# Patient Record
Sex: Male | Born: 1955 | Race: Black or African American | Hispanic: No | Marital: Married | Smoking: Former smoker
Health system: Southern US, Community
[De-identification: ages and names within clinical notes are randomized; demographics above are authoritative.]

## PROBLEM LIST (undated history)

## (undated) DIAGNOSIS — I10 Essential (primary) hypertension: Secondary | ICD-10-CM

## (undated) DIAGNOSIS — N186 End stage renal disease: Secondary | ICD-10-CM

## (undated) DIAGNOSIS — K219 Gastro-esophageal reflux disease without esophagitis: Secondary | ICD-10-CM

## (undated) DIAGNOSIS — H269 Unspecified cataract: Secondary | ICD-10-CM

## (undated) DIAGNOSIS — J984 Other disorders of lung: Secondary | ICD-10-CM

## (undated) DIAGNOSIS — G709 Myoneural disorder, unspecified: Secondary | ICD-10-CM

## (undated) DIAGNOSIS — E11319 Type 2 diabetes mellitus with unspecified diabetic retinopathy without macular edema: Secondary | ICD-10-CM

## (undated) DIAGNOSIS — H35039 Hypertensive retinopathy, unspecified eye: Secondary | ICD-10-CM

## (undated) DIAGNOSIS — E872 Acidosis, unspecified: Secondary | ICD-10-CM

## (undated) DIAGNOSIS — M545 Low back pain, unspecified: Secondary | ICD-10-CM

## (undated) DIAGNOSIS — K859 Acute pancreatitis without necrosis or infection, unspecified: Secondary | ICD-10-CM

## (undated) DIAGNOSIS — E119 Type 2 diabetes mellitus without complications: Secondary | ICD-10-CM

## (undated) DIAGNOSIS — F209 Schizophrenia, unspecified: Secondary | ICD-10-CM

## (undated) HISTORY — PX: DENTAL SURGERY: SHX609

## (undated) HISTORY — DX: Type 2 diabetes mellitus with unspecified diabetic retinopathy without macular edema: E11.319

## (undated) HISTORY — DX: Unspecified cataract: H26.9

## (undated) HISTORY — PX: COLONOSCOPY: SHX174

## (undated) HISTORY — DX: Hypertensive retinopathy, unspecified eye: H35.039

## (undated) HISTORY — PX: CATARACT EXTRACTION: SUR2

## (undated) HISTORY — DX: Other disorders of lung: J98.4

---

## 1994-01-23 ENCOUNTER — Encounter (INDEPENDENT_AMBULATORY_CARE_PROVIDER_SITE_OTHER): Payer: Self-pay | Admitting: *Deleted

## 1997-06-28 ENCOUNTER — Emergency Department (HOSPITAL_COMMUNITY): Admission: EM | Admit: 1997-06-28 | Discharge: 1997-06-28 | Payer: Self-pay | Admitting: Emergency Medicine

## 1998-05-08 ENCOUNTER — Emergency Department (HOSPITAL_COMMUNITY): Admission: EM | Admit: 1998-05-08 | Discharge: 1998-05-09 | Payer: Self-pay | Admitting: Emergency Medicine

## 1998-10-10 ENCOUNTER — Emergency Department (HOSPITAL_COMMUNITY): Admission: EM | Admit: 1998-10-10 | Discharge: 1998-10-10 | Payer: Self-pay | Admitting: Emergency Medicine

## 1998-10-10 ENCOUNTER — Encounter: Payer: Self-pay | Admitting: Emergency Medicine

## 1998-11-10 ENCOUNTER — Encounter: Admission: RE | Admit: 1998-11-10 | Discharge: 1998-11-10 | Payer: Self-pay | Admitting: Family Medicine

## 1998-11-10 ENCOUNTER — Observation Stay (HOSPITAL_COMMUNITY): Admission: AD | Admit: 1998-11-10 | Discharge: 1998-11-11 | Payer: Self-pay | Admitting: Family Medicine

## 1998-11-10 ENCOUNTER — Encounter: Payer: Self-pay | Admitting: Family Medicine

## 1998-12-06 ENCOUNTER — Encounter: Admission: RE | Admit: 1998-12-06 | Discharge: 1998-12-06 | Payer: Self-pay | Admitting: Family Medicine

## 1998-12-15 ENCOUNTER — Encounter: Admission: RE | Admit: 1998-12-15 | Discharge: 1998-12-15 | Payer: Self-pay | Admitting: Family Medicine

## 1998-12-20 ENCOUNTER — Encounter: Admission: RE | Admit: 1998-12-20 | Discharge: 1998-12-20 | Payer: Self-pay | Admitting: Family Medicine

## 1998-12-25 ENCOUNTER — Encounter: Admission: RE | Admit: 1998-12-25 | Discharge: 1998-12-25 | Payer: Self-pay | Admitting: Family Medicine

## 1999-01-08 ENCOUNTER — Encounter: Admission: RE | Admit: 1999-01-08 | Discharge: 1999-01-08 | Payer: Self-pay | Admitting: Family Medicine

## 1999-01-25 ENCOUNTER — Encounter: Admission: RE | Admit: 1999-01-25 | Discharge: 1999-01-25 | Payer: Self-pay | Admitting: Family Medicine

## 1999-08-09 ENCOUNTER — Encounter: Admission: RE | Admit: 1999-08-09 | Discharge: 1999-08-09 | Payer: Self-pay | Admitting: Family Medicine

## 1999-08-10 ENCOUNTER — Encounter: Admission: RE | Admit: 1999-08-10 | Discharge: 1999-08-10 | Payer: Self-pay | Admitting: Family Medicine

## 2000-09-22 ENCOUNTER — Encounter: Admission: RE | Admit: 2000-09-22 | Discharge: 2000-09-22 | Payer: Self-pay | Admitting: Family Medicine

## 2001-06-10 ENCOUNTER — Emergency Department (HOSPITAL_COMMUNITY): Admission: EM | Admit: 2001-06-10 | Discharge: 2001-06-10 | Payer: Self-pay

## 2001-08-14 ENCOUNTER — Emergency Department (HOSPITAL_COMMUNITY): Admission: EM | Admit: 2001-08-14 | Discharge: 2001-08-14 | Payer: Self-pay | Admitting: *Deleted

## 2002-09-12 ENCOUNTER — Encounter: Payer: Self-pay | Admitting: Emergency Medicine

## 2002-09-12 ENCOUNTER — Inpatient Hospital Stay (HOSPITAL_COMMUNITY): Admission: EM | Admit: 2002-09-12 | Discharge: 2002-09-14 | Payer: Self-pay

## 2004-04-03 ENCOUNTER — Emergency Department (HOSPITAL_COMMUNITY): Admission: EM | Admit: 2004-04-03 | Discharge: 2004-04-03 | Payer: Self-pay | Admitting: Emergency Medicine

## 2004-08-25 ENCOUNTER — Emergency Department (HOSPITAL_COMMUNITY): Admission: EM | Admit: 2004-08-25 | Discharge: 2004-08-25 | Payer: Self-pay | Admitting: Emergency Medicine

## 2005-07-30 ENCOUNTER — Ambulatory Visit: Payer: Self-pay | Admitting: Psychiatry

## 2005-07-30 ENCOUNTER — Inpatient Hospital Stay (HOSPITAL_COMMUNITY): Admission: EM | Admit: 2005-07-30 | Discharge: 2005-08-05 | Payer: Self-pay | Admitting: Psychiatry

## 2005-08-07 ENCOUNTER — Ambulatory Visit: Payer: Self-pay | Admitting: *Deleted

## 2005-08-07 ENCOUNTER — Ambulatory Visit: Payer: Self-pay | Admitting: Family Medicine

## 2006-04-11 ENCOUNTER — Emergency Department (HOSPITAL_COMMUNITY): Admission: EM | Admit: 2006-04-11 | Discharge: 2006-04-11 | Payer: Self-pay | Admitting: Emergency Medicine

## 2006-05-23 ENCOUNTER — Encounter (INDEPENDENT_AMBULATORY_CARE_PROVIDER_SITE_OTHER): Payer: Self-pay | Admitting: *Deleted

## 2006-09-17 ENCOUNTER — Emergency Department (HOSPITAL_COMMUNITY): Admission: EM | Admit: 2006-09-17 | Discharge: 2006-09-17 | Payer: Self-pay | Admitting: Emergency Medicine

## 2006-11-03 ENCOUNTER — Ambulatory Visit: Payer: Self-pay | Admitting: Family Medicine

## 2006-11-03 LAB — CONVERTED CEMR LAB
ALT: 22 units/L (ref 0–53)
AST: 13 units/L (ref 0–37)
Albumin: 4.6 g/dL (ref 3.5–5.2)
Alkaline Phosphatase: 99 units/L (ref 39–117)
BUN: 16 mg/dL (ref 6–23)
Basophils Absolute: 0 10*3/uL (ref 0.0–0.1)
Basophils Relative: 1 % (ref 0–1)
Eosinophils Absolute: 0.2 10*3/uL (ref 0.0–0.7)
HDL: 44 mg/dL (ref >39)
LDL Cholesterol: 141 mg/dL — ABNORMAL HIGH (ref 0–99)
MCHC: 33.9 g/dL (ref 30.0–36.0)
MCV: 88.3 fL (ref 78.0–100.0)
Monocytes Relative: 9 % (ref 3–11)
Neutrophils Relative %: 54 % (ref 43–77)
PSA: 0.46 ng/mL (ref 0.10–4.00)
Platelets: 285 10*3/uL (ref 150–400)
Potassium: 4.1 meq/L (ref 3.5–5.3)
RBC: 5.05 M/uL (ref 4.22–5.81)
RDW: 13.3 % (ref 11.5–14.0)
Total CHOL/HDL Ratio: 5.5

## 2006-11-20 ENCOUNTER — Ambulatory Visit: Payer: Self-pay | Admitting: Family Medicine

## 2006-12-02 ENCOUNTER — Ambulatory Visit: Payer: Self-pay | Admitting: Family Medicine

## 2006-12-10 ENCOUNTER — Encounter (INDEPENDENT_AMBULATORY_CARE_PROVIDER_SITE_OTHER): Payer: Self-pay | Admitting: *Deleted

## 2007-03-31 ENCOUNTER — Ambulatory Visit: Payer: Self-pay | Admitting: Family Medicine

## 2007-03-31 LAB — CONVERTED CEMR LAB
Alkaline Phosphatase: 90 units/L (ref 39–117)
BUN: 17 mg/dL (ref 6–23)
Basophils Absolute: 0 10*3/uL (ref 0.0–0.1)
Basophils Relative: 1 % (ref 0–1)
Chloride: 98 meq/L (ref 96–112)
Cholesterol: 186 mg/dL (ref 0–200)
Creatinine, Ser: 1.33 mg/dL (ref 0.40–1.50)
HDL: 32 mg/dL — ABNORMAL LOW (ref >39)
LDL Cholesterol: 81 mg/dL (ref 0–99)
MCHC: 33.3 g/dL (ref 30.0–36.0)
Microalb, Ur: 10.6 mg/dL — ABNORMAL HIGH (ref 0.00–1.89)
Neutro Abs: 3.3 10*3/uL (ref 1.7–7.7)
Neutrophils Relative %: 58 % (ref 43–77)
Potassium: 4.3 meq/L (ref 3.5–5.3)
RBC: 4.54 M/uL (ref 4.22–5.81)
RDW: 13.1 % (ref 11.5–15.5)
Sodium: 133 meq/L — ABNORMAL LOW (ref 135–145)
Total Bilirubin: 0.3 mg/dL (ref 0.3–1.2)
Total Protein: 7.9 g/dL (ref 6.0–8.3)
Triglycerides: 367 mg/dL — ABNORMAL HIGH (ref <150)
VLDL: 73 mg/dL — ABNORMAL HIGH (ref 0–40)

## 2007-09-23 ENCOUNTER — Emergency Department (HOSPITAL_COMMUNITY): Admission: EM | Admit: 2007-09-23 | Discharge: 2007-09-23 | Payer: Self-pay | Admitting: Emergency Medicine

## 2008-01-24 ENCOUNTER — Emergency Department (HOSPITAL_COMMUNITY): Admission: EM | Admit: 2008-01-24 | Discharge: 2008-01-24 | Payer: Self-pay | Admitting: Emergency Medicine

## 2008-04-26 ENCOUNTER — Other Ambulatory Visit: Payer: Self-pay | Admitting: Emergency Medicine

## 2008-04-27 ENCOUNTER — Ambulatory Visit: Payer: Self-pay | Admitting: Psychiatry

## 2008-04-27 ENCOUNTER — Inpatient Hospital Stay (HOSPITAL_COMMUNITY): Admission: AD | Admit: 2008-04-27 | Discharge: 2008-05-03 | Payer: Self-pay | Admitting: Psychiatry

## 2009-01-23 ENCOUNTER — Emergency Department (HOSPITAL_COMMUNITY): Admission: EM | Admit: 2009-01-23 | Discharge: 2009-01-23 | Payer: Self-pay | Admitting: Emergency Medicine

## 2010-06-27 LAB — POCT I-STAT, CHEM 8
BUN: 30 mg/dL — ABNORMAL HIGH (ref 6–23)
Creatinine, Ser: 1.4 mg/dL (ref 0.4–1.5)
Glucose, Bld: 186 mg/dL — ABNORMAL HIGH (ref 70–99)
Potassium: 4.4 mEq/L (ref 3.5–5.1)
Sodium: 133 mEq/L — ABNORMAL LOW (ref 135–145)

## 2010-06-27 LAB — URINE MICROSCOPIC-ADD ON

## 2010-06-27 LAB — URINALYSIS, ROUTINE W REFLEX MICROSCOPIC
Bilirubin Urine: NEGATIVE
Nitrite: NEGATIVE
Specific Gravity, Urine: 1.02 (ref 1.005–1.030)
Urobilinogen, UA: 0.2 mg/dL (ref 0.0–1.0)
pH: 5 (ref 5.0–8.0)

## 2010-06-27 LAB — URINE CULTURE

## 2010-07-10 LAB — GLUCOSE, CAPILLARY
Glucose-Capillary: 246 mg/dL — ABNORMAL HIGH (ref 70–99)
Glucose-Capillary: 249 mg/dL — ABNORMAL HIGH (ref 70–99)
Glucose-Capillary: 260 mg/dL — ABNORMAL HIGH (ref 70–99)
Glucose-Capillary: 291 mg/dL — ABNORMAL HIGH (ref 70–99)
Glucose-Capillary: 294 mg/dL — ABNORMAL HIGH (ref 70–99)
Glucose-Capillary: 302 mg/dL — ABNORMAL HIGH (ref 70–99)
Glucose-Capillary: 310 mg/dL — ABNORMAL HIGH (ref 70–99)
Glucose-Capillary: 339 mg/dL — ABNORMAL HIGH (ref 70–99)
Glucose-Capillary: 345 mg/dL — ABNORMAL HIGH (ref 70–99)
Glucose-Capillary: 379 mg/dL — ABNORMAL HIGH (ref 70–99)
Glucose-Capillary: 382 mg/dL — ABNORMAL HIGH (ref 70–99)
Glucose-Capillary: 202 mg/dL — ABNORMAL HIGH (ref 70–99)

## 2010-07-10 LAB — COMPREHENSIVE METABOLIC PANEL
ALT: 33 U/L (ref 0–53)
AST: 40 U/L — ABNORMAL HIGH (ref 0–37)
Albumin: 4.4 g/dL (ref 3.5–5.2)
Alkaline Phosphatase: 132 U/L — ABNORMAL HIGH (ref 39–117)
GFR calc Af Amer: >60 mL/min (ref >60)
Potassium: 4.6 mEq/L (ref 3.5–5.1)
Sodium: 134 mEq/L — ABNORMAL LOW (ref 135–145)
Total Protein: 8.2 g/dL (ref 6.0–8.3)

## 2010-07-10 LAB — URINALYSIS, ROUTINE W REFLEX MICROSCOPIC
Bilirubin Urine: NEGATIVE
Glucose, UA: >1000 mg/dL — AB
Ketones, ur: 15 mg/dL — AB
Leukocytes, UA: NEGATIVE
Nitrite: NEGATIVE
Protein, ur: 100 mg/dL — AB
Specific Gravity, Urine: 1.027 (ref 1.005–1.030)
Urobilinogen, UA: 0.2 mg/dL (ref 0.0–1.0)
pH: 5.5 (ref 5.0–8.0)

## 2010-07-10 LAB — DIFFERENTIAL
Basophils Absolute: 0 K/uL (ref 0.0–0.1)
Basophils Relative: 0 % (ref 0–1)
Eosinophils Absolute: 0.1 10*3/uL (ref 0.0–0.7)
Eosinophils Relative: 3 % (ref 0–5)
Lymphocytes Relative: 36 % (ref 12–46)
Lymphs Abs: 1.5 10*3/uL (ref 0.7–4.0)
Monocytes Absolute: 0.4 10*3/uL (ref 0.1–1.0)
Monocytes Relative: 10 % (ref 3–12)
Neutro Abs: 2.1 K/uL (ref 1.7–7.7)
Neutrophils Relative %: 50 % (ref 43–77)

## 2010-07-10 LAB — CBC
HCT: 42.6 % (ref 39.0–52.0)
Hemoglobin: 14.4 g/dL (ref 13.0–17.0)
MCHC: 33.8 g/dL (ref 30.0–36.0)
MCV: 89.5 fL (ref 78.0–100.0)
Platelets: 251 10*3/uL (ref 150–400)
RBC: 4.76 MIL/uL (ref 4.22–5.81)
RDW: 13.2 % (ref 11.5–15.5)
WBC: 4.1 K/uL (ref 4.0–10.5)

## 2010-07-10 LAB — POCT TOXICOLOGY PANEL
Benzodiazepines: POSITIVE
Cocaine: POSITIVE
Tetrahydrocannabinol: POSITIVE

## 2010-07-10 LAB — COMPREHENSIVE METABOLIC PANEL WITH GFR
BUN: 15 mg/dL (ref 6–23)
CO2: 24 meq/L (ref 19–32)
Calcium: 9.2 mg/dL (ref 8.4–10.5)
Chloride: 99 meq/L (ref 96–112)
Creatinine, Ser: 1.3 mg/dL (ref 0.4–1.5)
GFR calc non Af Amer: 58 mL/min — ABNORMAL LOW (ref >60)
Glucose, Bld: 381 mg/dL — ABNORMAL HIGH (ref 70–99)
Total Bilirubin: 0.7 mg/dL (ref 0.3–1.2)

## 2010-07-10 LAB — URINE MICROSCOPIC-ADD ON

## 2010-07-10 LAB — GLUCOSE, RANDOM: Glucose, Bld: 369 mg/dL — ABNORMAL HIGH (ref 70–99)

## 2010-07-10 LAB — ETHANOL: Alcohol, Ethyl (B): <5 mg/dL (ref 0–10)

## 2010-08-07 NOTE — H&P (Signed)
Jonathan Mcneil, Jonathan Mcneil NO.:  [id number]   MEDICAL RECORD NO.:  [id number]          PATIENT TYPE:  IPS   LOCATION:  0401                          [facility]ACILITY[facility]   PHYSICIAN:  Norm Salt, MD  DATE OF BIRTH:  1955-07-20   DATE OF ADMISSION:  04/27/2008  DATE OF DISCHARGE:                       PSYCHIATRIC ADMISSION ASSESSMENT   This is a 55 year old male involuntarily petitioned April 27, 2008.   HISTORY OF PRESENT ILLNESS:  The patient is here on petition.  Papers  state the patient has been threatening family members, agitated and is  considered a danger to himself and others.  The patient states that he  has been off his medications and had an episode yesterday where he  threatened his family members because he blacked out.  Has been using  as he states street drugs, cocaine, marijuana and alcohol.  He denies  any hallucinations and denies any specific stressors.   PAST PSYCHIATRIC HISTORY:  This is the patient's second admission.  He  was here in May of 2007 with paranoid ideation and substance use.  He is  a client of [facility].   SOCIAL HISTORY:  This is a married male.  He lives in [place].  He is  unemployed.   FAMILY HISTORY:  Is unclear.   ALCOHOL AND DRUG HISTORY:  The patient has been drinking and using  street drugs.  No IV use.  Using marijuana, cocaine and alcohol.   PRIMARY CARE Latysha Thackston:  Dr. Ophelia Shoulder.   MEDICAL PROBLEMS:  Hypertension, non-insulin-dependent diabetes.   MEDICATIONS:  1. Most recently prescribed Lexapro.  2. Metformin.  3. Prolixin 2.5 h.s., compliance is unclear.   DRUG ALLERGIES:  PENICILLIN.   PHYSICAL EXAM:  This is a middle-aged male who was fully assessed at  [facility] emergency department.  He did receive IV fluids.  VITAL SIGNS:  Temperature of 98.2, 99 heart rate, 16 respirations, blood  pressure is 153/96.  GENERAL:  This is a well-developed, well-nourished male,  appears in no  acute distress.  Offers no complaints.   LABORATORY DATA:  Shows a sodium of 134, glucose of 351.  CBC within  normal limits.  Urinalysis is negative.  Urine drug screen is positive  for benzodiazepines, positive for cocaine.   MENTAL STATUS EXAM:  The patient is resting in bed, cooperates readily,  pleasant, good eye contact.  No obvious delusional statements.  Denies  any suicidal or homicidal thoughts.  Cognitive function intact.  Memory  appears good.  Judgment fair.  Insight is limited.   DIAGNOSES:  AXIS I:  Schizoaffective disorder not otherwise specified,  polysubstance abuse.  AXIS II:  Deferred.  AXIS III:  Non-insulin-dependent diabetes, hypertension.  AXIS IV:  Psychosocial problems, burden of illness, noncompliance and  substance use, problems with primary support group with recent  threatening behavior.  AXIS V:  Current is 30.   PLAN:  Contract for safety.  Will stabilize mood.  Will resume his  Prolixin and his metformin.  Will check his blood sugars twice a day.  Will also have sliding scale  insulin available.  We will reinforce  medication compliance and identify support.  Contact family for concerns  and returning to living situation.  Case manager will obtain his  followup.  Will also work on relapse prevention.  Tentative length of  stay at this time is 3-5 days.      Redgie Grayer, N.P.      Norm Salt, MD  Electronically Signed    JO/MEDQ  D:  04/28/2008  T:  04/28/2008  Job:  [id number]

## 2010-08-10 NOTE — Consult Note (Signed)
[name], SPORT                     ACCOUNT NO.:  [id number]   MEDICAL RECORD NO.:  [id number]                   PATIENT TYPE:  INP   LOCATION:  [id number]                                 FACILITY:  [facility]   PHYSICIAN:  Loretha Brasil. Lia Foyer, M.D.             DATE OF BIRTH:  [birth year]   DATE OF CONSULTATION:  09/13/2002  DATE OF DISCHARGE:                                   CONSULTATION   CHIEF COMPLAINT:  Chest pain.   HISTORY OF PRESENT ILLNESS:  Mr. Jonathan Mcneil is a 55 year old gentleman who works  at [employer].  He was admitted to [facility] on September 12, 2002  because of chest pain.  The chest pain occurred in the left upper chest and  also in the left upper quadrant.  He denies any prior cardiac history except  for some slow heart rate.  The discomfort occurred at about 3 to 4 a.m. on  Sunday.  He had been feeling a little bit dizzy.  He got up from sleeping  and noticed the left-sided chest pain.  He went to work at 6 a.m. and the  pain became worse, and he went to the emergency room.  He also had a  headache.  He has known hypertension and diabetes and had not been taking  any medicines because he is currently in bankruptcy and only taking home  about $85.00 per week.  Now, he has had chest pain intermittently for about  two weeks.   MEDICINES PRIOR TO ADMISSION:  None.   PAST MEDICAL HISTORY:  Remarkable for diabetes, hypertension and a tobacco  history with about one-fourth pack cigarettes a day.   SOCIAL HISTORY:  He works at [employer].  He has significant stress  secondary to his finances and his marriage.  He is married and has no  children.  He occasionally drinks wine and beer.  He also smokes about one-  fourth pack of cigarettes a day.   FAMILY HISTORY:  His father died in his 54s of an MI and a CVA and had  diabetes.  His mother is alive and well at 50 but has hypertension.  He has  a brother with head and neck cancer.   REVIEW OF SYSTEMS:   Remarkable for some flashing of light with vision.  He  has lost from 240 down to 210 pounds.  There has been some depression and  anxiety as well as some arthralgias.  The review of systems is otherwise  negative.   PHYSICAL EXAMINATION:  GENERAL:  He is an alert, oriented male in no acute  distress.  VITAL SIGNS:  Blood pressure is 122/74 with a pulse of 64, temperature 98.3,  respirations 18.  HEENT:  Examination is unremarkable.  NECK:  There are no carotid bruits.  LUNGS:  The lung fields are clear to auscultation and percussion.  CARDIAC:  The cardiac rhythm is regular.  S1 and S2 are normal  with a very  soft S4 gallop.  There was no S3 gallop noted.  ABDOMEN:  The abdomen was soft with no hepatosplenomegaly.  EXTREMITIES:  The extremities revealed no edema.  NEUROLOGIC:  Exam was unremarkable.  SKIN:  Warm and dry.   LABORATORY DATA:  The electrocardiogram was unremarkable.  It demonstrates  normal sinus rhythm.  There is a slightly prolonged Q-T interval.   Serial enzymes include troponin I's of 0.06, 0.08, 0.05; MB fraction has  been less than 1%.  Hemoglobin A1c is 8.3.  LDL cholesterol was 120.  Hemoglobin 14, white cell count 7000.  Potassium is 3.3 on admission.   IMPRESSION:  1. Chest pain of uncertain etiology.  The patient's pain is most likely     musculoskeletal, but given the patient's risk factor profile and     borderline enzymes, I think that coronary arteriography is required to     exclude a definitive diagnosis of underlying coronary artery disease.  By     definition, with his diabetes, he has underlying coronary artery disease.  2. Dyslipidemia.  3. Diabetes mellitus.  4. History of tobacco use.  5. Hypertension with headaches and poor compliance to medication.  6. Hypokalemia.  7. History of situational depression.   RECOMMENDATIONS:  I have approached the alternatives with the patient.  It  would be difficult for him to get a lot of his medicines.   He clearly needs  a diagnostic study to exclude underlying CAD as the cause of his chest pain.  Alternatives have included coronary arteriography or stress testing.  The  patient is agreeable to cardiac catheterization, which I think is probably  required given the positivity of his enzymes.  Risks, benefits and  alternatives have been explained to the patient in detail, and he consents  to proceed.                                               Loretha Brasil. Lia Foyer, M.D.    TDS/MEDQ  D:  09/13/2002  T:  09/14/2002  Job:  [id number]

## 2010-08-10 NOTE — Discharge Summary (Signed)
   NAMEEVERLY, HAIZLIP                     ACCOUNT NO.:  [id number]   MEDICAL RECORD NO.:  [id number]                   PATIENT TYPE:  OUT   LOCATION:  CATH                                 FACILITY:  [facility]   PHYSICIAN:  Judithann Graves, M.D.        DATE OF BIRTH:  May 03, 1955   DATE OF ADMISSION:  09/14/2002  DATE OF DISCHARGE:  09/14/2002                                 DISCHARGE SUMMARY   CONTINUATION  The patient desired to go home.  He was discharged home in stable condition  on September 14, 2002.  He was afebrile.  Pulse was 55, blood pressure 143/87.  Blood sugars were 123-175, although he had not been on his Glucophage.  His  discharge labs were within normal limits.  His LDL was 120, which was  slightly high for someone who has diabetes.  The patient expressed concern  over cost of his medicines and desired only medicines that he needed.  He  had agreed to follow up at [facility] where they will be able to address  many of his issues and follow up on his abdominal pain if it does not  resolve with empiric Protonix therapy.   DISCHARGE MEDICATIONS:  1. Lopressor 25 mg p.o. b.i.d.  2. Protonix 40 mg p.o. daily.  3. Glucophage 500 mg p.o. b.i.d.  He is not to start the Glucophage until     September 16, 2002.   DISCHARGE PLAN:  The patient is to go home.  He will follow up with [facility] next week.  Specifically, if he is still having abdominal pain, he may  need further investigation such as a CT scan of the abdomen.  This was  offered to the patient, although he wanted to go home and wished to pursue  this once he was established at [facility].  In addition, his lipids will  need to be followed as will his sugars and both of those can be managed as  an outpatient.                                               Judithann Graves, M.D.    AMC/MEDQ  D:  09/14/2002  T:  09/15/2002  Job:  [id number]   cc:   [facility]

## 2010-08-10 NOTE — Discharge Summary (Signed)
[name], Mcneil NO.:  [id number]   MEDICAL RECORD NO.:  [id number]          PATIENT TYPE:  IPS   LOCATION:  0401                          [facility]   PHYSICIAN:  Jonathan Salt, MD  DATE OF BIRTH:  [birth year]   DATE OF ADMISSION:  04/27/2008  DATE OF DISCHARGE:  05/03/2008                               DISCHARGE SUMMARY   IDENTIFYING DATA/REASON FOR ADMISSION:  This was an inpatient  psychiatric admission for Jonathan Mcneil, a 55 year old African American male who  was admitted due to increasing signs and symptoms of medication, within  a context of both polysubstance abuse, and discontinuation of his  psychotropic medications.  Please refer to the admission note for  further details pertaining to the symptoms, circumstances and history  that led to his hospitalization.  He was given an initial Axis I  diagnosis of schizoaffective disorder, and polysubstance abuse.   MEDICAL AND LABORATORY:  The patient was medically and physically  assessed by the psychiatric nurse practitioner.  He came to [place] with a  history of hypertension and hyperglycemia.  He was medically and  physically assessed by the psychiatric nurse practitioner.  There were  no acute medical issues.  At the time of discharge, he was referred to  Jonathan Mcneil for medical follow-up.  He was continued on his usual  metformin 1000 mg b.i.d.   HOSPITAL COURSE:  The patient was admitted to the adult inpatient  psychiatric service.  He presented as a well-nourished, normally-  developed adult male who was alert, oriented, pleasant, and mildly  depressed.  Superficially, his thoughts and speech were within normal  limits.  He made no overtly delusional statements.  He showed a  reasonable amount of insight.  He was agreeable to being treated and  restarted on his usual psychotropic regimen of consisting of Prolixin.  He was given 2.5 mg q.h.s.  With this relatively small dose, he made  good  therapeutic gains fairly quickly.  He allowed [place] to be in contact  with his family, which was very supportive.   A family session occurred with his wife on the fourth hospital day.  Medications, progress, and aftercare with discussed at length.   The patient was appropriate for discharge on hospital day #7.  He agreed  to the following aftercare plan.  It should be noted that although the  patient was encouraged to consider a 90-day substance abuse program in  the state of [place], he indicated that he was not interested in this.   AFTERCARE/FOLLOW UP:  The patient was to follow-up at the [facility] with an appointment to see their psychiatrist on May 10, 2008 at 9:30 a.m.  He was also referred to the [place] program in [facility], with an intake appointment on May 04, 2008 at 1:00 p.m.   DISCHARGE MEDICATIONS:  1. Prolixin 2.5 mg q.h.s.  2. Glucophage 1000 mg b.i.d.   DISCHARGE DIAGNOSES:  AXIS I:  Schizoaffective disorder NOS, and  polysubstance abuse NOS.  AXIS II:  Deferred.  AXIS III:  History of hypertension, history of diabetes mellitus.  AXIS IV:  Stressors severe.  AXIS V:  GAF on discharge 55.      Jonathan Salt, MD  Electronically Signed     SPB/MEDQ  D:  05/04/2008  T:  05/04/2008  Job:  [id number]

## 2010-08-10 NOTE — Discharge Summary (Signed)
Jonathan Mcneil, COSSE NO.:  [id number]   MEDICAL RECORD NO.:  [id number]          PATIENT TYPE:  IPS   LOCATION:  0400                          [facility]   PHYSICIAN:  Carlton Adam, M.D.      DATE OF BIRTH:  04-27-1955   DATE OF ADMISSION:  07/30/2005  DATE OF DISCHARGE:  08/05/2005                                 DISCHARGE SUMMARY   CHIEF COMPLAINT AND PRESENT ILLNESS:  This was the first admission to [facility] for this 55 year old African-American male, married,  separated, voluntarily admitted.  Unemployed.  Presented to the emergency  department requesting help with voices that returns, starting two years ago,  voices are sporadic with paranoia, hostility.  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  Voices worse in the last week.  Previously treated with medication.  Recently none due to financial  difficulties.  Suicidal ideation for two months, reclusive, __________  sleep, staying in bed 22 hours a day, paranoia, guarding, some dysphoric  affect.   PAST PSYCHIATRIC HISTORY:  Seen at [facility].  Prior admissions to [facility] __________ [facility].  First time at  [facility].   ALCOHOL/DRUG HISTORY:  Some occasional use of cocaine, marijuana and  alcohol.  None in three weeks.   MEDICAL HISTORY:  Diabetes mellitus, type 2, arterial hypertension.   MEDICATIONS:  Regular insulin 8 units and Glucophage 500 mg twice a day,  Geodon IM 10 mg given in the emergency room (he claimed helped him a lot).   PHYSICAL EXAMINATION:  Performed and failed to show any acute findings.   LABORATORY DATA:  CBC with white blood cells 6.3, hemoglobin 15.5.  Blood  chemistry with sodium 133, potassium 4.9, glucose 387.  Drug screening  negative for substances of abuse.   MENTAL STATUS EXAM:  Upon admission revealed an alert male, somewhat  guarded, very cautious but cooperative,  polite.  Speech not as spontaneous,  decreased in amount.  Would not initiate conversation.  Very soft tone.  Mood depressed.  Affect constricted.  Thought processes positive for  auditory hallucinations, voices telling him to get a weapon and hurt  someone, also to hurt himself.  He denied suicidal ideation and denied  homicidal ideation.  Cognition was well-preserved.   ADMISSION DIAGNOSES:  AXIS I:  Major depression with psychotic features  versus schizoaffective disorder.  Marijuana, cocaine and alcohol abuse.  AXIS II:  No diagnosis.  AXIS III:  Diabetes mellitus, type 2.  AXIS IV:  Moderate.  AXIS V:  GAF upon admission 25; highest GAF in the last year 46.   HOSPITAL COURSE:  He was admitted.  He was started in individual and group  psychotherapy.  He was given Geodon in the emergency room that he found  effective, so we pursued the Geodon 80 mg twice a day.  He endorsed auditory  hallucinations, lasting several years, worse in the last several months.  Cannot exactly say what the voices were saying.  Upon admission, he was  separated  from his wife.  Would have to find another place to live.  Could  not work due to his problems.  Not collecting any disability benefits.  Very  worried, very concerned.  At the same time, very vague, very reserved, very  guarded.  Claimed that he was wanting some help.  Continued to endorse  hallucinations.  We pursued the Geodon further.  Voices were telling him to  hurt himself or hurt other people, though he could contract for safety while  in the unit.  As the symptoms persist, they went ahead and added Depakote.  Continued to endorse depression.  He required close monitoring for his blood  sugar.  Internal medicine was consulted.  By Aug 02, 2005, continued to hear  the voices.  We increased the Geodon further.  We continued to work with the  Depakote.  Family session with the wife.  They were able to work on ways to  get better.  She was  supportive.  On Aug 05, 2005, he endorsed that he was  feeling much better.  Endorsed decrease in the voices, almost gone.  The  wife was going to help him out.  He felt encouraged, so he was hopeful.  There were no active suicidal or homicidal ideation.  Overall, his mood was  improving and he was willing and motivated to pursue further outpatient  treatment.   DISCHARGE DIAGNOSES:  AXIS I:  Major depression with psychotic features.  Rule out bipolar depression.  Marijuana, cocaine and alcohol abuse by  history.  AXIS II:  No diagnosis.  AXIS III:  Diabetes mellitus, type 2.  AXIS IV:  Moderate.  AXIS V:  GAF upon discharge 50.   DISCHARGE MEDICATIONS:  1.  Glucophage 450 mg twice a day.  2.  Glucotrol XL 10 mg, 1 in the morning with breakfast.  3.  Depakote ER 250 mg, 1 twice a day.  4.  Geodon 80 mg twice a day.   FOLLOWUP:  To follow up with [facility] and [facility].      Carlton Adam, M.D.  Electronically Signed     IL/MEDQ  D:  08/22/2005  T:  08/22/2005  Job:  [id number]

## 2010-08-10 NOTE — Discharge Summary (Signed)
   Jonathan Mcneil, Jonathan Mcneil                     ACCOUNT NO.:  [id number]   MEDICAL RECORD NO.:  [id number]                   PATIENT TYPE:  OUT   LOCATION:  CATH                                 FACILITY:  [facility]   PHYSICIAN:  Judithann Graves, M.D.        DATE OF BIRTH:  [birth year]   DATE OF ADMISSION:  09/14/2002  DATE OF DISCHARGE:                                 DISCHARGE SUMMARY   DISCHARGE DIAGNOSES:  1. Atypical chest pain: Ruled out for myocardial infarction.  2. Hypertension.  3. Diabetes with hemoglobin A1c 8.3.   BRIEF HISTORY OF PRESENT ILLNESS:  The patient came to the emergency room  with about a one week history of recurrent left sided chest and abdominal  pain.  At times, it seemed to radiate to his left arm.  He also complained  of increased fatigue and shortness of breath.   HOSPITAL COURSE:  Given his multiple cardiac risk factors including  diabetes, hypertension, cigarettes, and a family with coronary disease, he  was admitted, sent to telemetry, and subsequently ruled out for myocardial  infarction with serial negative troponins, although they were borderline at  0.05, 0.06, and 0.08.  Because of this, he was started on a beta blocker  with Lovenox and cardiology was consulted as he did have some nonspecific  EKG changes and borderline enzymes.  [facility] elected to take him  to cardiac catheterization, which was done on September 14, 2002, the results of  which are that there was no evidence of coronary disease.  The patient  remained comfortable with occasional left upper quadrant pain, although he  was able to eat and felt that sometimes the medicine did make his abdominal  pain feel better.  After discussion with the patient, he desired to go home  as all life threatening causes of his abdominal pain had been ruled out.   Dictation ended at this point.                                               Judithann Graves, M.D.    AMC/MEDQ   D:  09/14/2002  T:  09/15/2002  Job:  [id number]

## 2010-08-10 NOTE — Cardiovascular Report (Signed)
Jonathan Mcneil, Jonathan Mcneil                     ACCOUNT NO.:  [id number]   MEDICAL RECORD NO.:  [id number]                   PATIENT TYPE:  OUT   LOCATION:  CATH                                 FACILITY:  [facility]   PHYSICIAN:  Ernestine Mcmurray, M.D.                 DATE OF BIRTH:  1955-12-23   DATE OF PROCEDURE:  09/14/2002  DATE OF DISCHARGE:                              CARDIAC CATHETERIZATION   PROCEDURE PERFORMED:  1. Left heart catheterization.  2. Selective coronary angiography.  3. Ventriculography.   DIAGNOSES:  No evidence of flow limiting epicardial coronary artery disease.   INDICATIONS:  The patient is a 55 year old male with multiple cardiovascular  risk factors for coronary artery disease.  The patient presents with  atypical chest pain.  He has been referred for diagnostic catheterization to  assess his coronary anatomy.   DESCRIPTION OF PROCEDURE:  After informed consent was obtained the patient  was brought to the catheterization laboratory.  The right groin was  sterilely prepped and draped.  A 6-French arterial sheath was placed using  modified Seldinger technique in the right femoral artery.  6-French [id number] and  [id number] catheters were used to engage the left and right coronary ostia,  respectively.  A ventriculography was performed with a 6-French angled  pigtail catheter.  At the termination of the procedure all catheters and  sheaths were removed and no complications were encountered.   FINDINGS:  HEMODYNAMICS:  1. Left ventricular pressure 153/15 mmHg.  2. Aortic pressure 150/97 mmHg.   VENTRICULOGRAPHY  Ejection fraction 60%.  No significant wall motion abnormalities.  No mitral  regurgitation.   SELECTIVE CORONARY ANGIOGRAPHY  1. The left main coronary artery was a large caliber vessel which had a long     course prior to bifurcating in the LAD and circumflex coronary artery.     There was no flow limiting disease.  2. Left anterior descending artery was a  large caliber vessel wrapping     around the apex with no evidence of flow limiting disease.  3. Circumflex coronary artery, again, had no flow limiting disease as well     as obtuse marginal branches which were free of flow limiting lesions.  4. The right coronary artery again was a large caliber vessel terminating in     PDA and posterolateral branch.  No flow limiting lesions were seen.    RECOMMENDATIONS:  Angiographic images are reviewed with Loretha Brasil. Lia Foyer,  M.D.  Advised to continue medical therapy.  The patient's primary care  physician was notified and we anticipate discharge later today.  The  patient's chest pain appears to be noncardiac in origin.  Ernestine Mcmurray, M.D.    GED/MEDQ  D:  09/14/2002  T:  09/15/2002  Job:  [id number]  Loretha Brasil. Lia Foyer, M.D.   cc:   Loretha Brasil. Lia Foyer, M.D.

## 2010-12-20 LAB — BASIC METABOLIC PANEL
BUN: 14
Chloride: 99
GFR calc non Af Amer: 54 — ABNORMAL LOW
Potassium: 3.8
Sodium: 134 — ABNORMAL LOW

## 2011-01-09 LAB — BASIC METABOLIC PANEL
BUN: 15
CO2: 21
Chloride: 100
Creatinine, Ser: 1.43
Glucose, Bld: 214 — ABNORMAL HIGH
Potassium: 3.6

## 2011-01-09 LAB — CBC
HCT: 41.1
MCHC: 34.2
MCV: 87
Platelets: 347
RDW: 14
WBC: 8.8

## 2011-01-09 LAB — DIFFERENTIAL
Basophils Relative: 0
Eosinophils Absolute: 0.1
Eosinophils Relative: 1
Lymphs Abs: 3.2
Neutrophils Relative %: 54

## 2011-01-09 LAB — RAPID URINE DRUG SCREEN, HOSP PERFORMED
Amphetamines: NOT DETECTED
Barbiturates: NOT DETECTED
Benzodiazepines: NOT DETECTED
Cocaine: POSITIVE — AB
Opiates: NOT DETECTED

## 2011-01-09 LAB — SALICYLATE LEVEL: Salicylate Lvl: <4

## 2011-01-09 LAB — ETHANOL: Alcohol, Ethyl (B): 156 — ABNORMAL HIGH

## 2012-02-11 ENCOUNTER — Encounter (HOSPITAL_COMMUNITY): Payer: Self-pay | Admitting: Radiology

## 2012-02-11 ENCOUNTER — Observation Stay (HOSPITAL_COMMUNITY)
Admission: EM | Admit: 2012-02-11 | Discharge: 2012-02-12 | Disposition: A | Discharge status: Home / Self Care | Payer: Medicare Other | Attending: Emergency Medicine | Admitting: Emergency Medicine

## 2012-02-11 ENCOUNTER — Emergency Department (HOSPITAL_COMMUNITY): Payer: Medicare Other

## 2012-02-11 DIAGNOSIS — R209 Unspecified disturbances of skin sensation: Secondary | ICD-10-CM | POA: Insufficient documentation

## 2012-02-11 DIAGNOSIS — F17200 Nicotine dependence, unspecified, uncomplicated: Secondary | ICD-10-CM | POA: Insufficient documentation

## 2012-02-11 DIAGNOSIS — E119 Type 2 diabetes mellitus without complications: Secondary | ICD-10-CM | POA: Insufficient documentation

## 2012-02-11 DIAGNOSIS — Z794 Long term (current) use of insulin: Secondary | ICD-10-CM | POA: Insufficient documentation

## 2012-02-11 DIAGNOSIS — I10 Essential (primary) hypertension: Secondary | ICD-10-CM | POA: Insufficient documentation

## 2012-02-11 DIAGNOSIS — F209 Schizophrenia, unspecified: Secondary | ICD-10-CM | POA: Insufficient documentation

## 2012-02-11 DIAGNOSIS — R51 Headache: Secondary | ICD-10-CM | POA: Insufficient documentation

## 2012-02-11 DIAGNOSIS — R079 Chest pain, unspecified: Principal | ICD-10-CM | POA: Insufficient documentation

## 2012-02-11 DIAGNOSIS — Z79899 Other long term (current) drug therapy: Secondary | ICD-10-CM | POA: Insufficient documentation

## 2012-02-11 HISTORY — DX: Schizophrenia, unspecified: F20.9

## 2012-02-11 HISTORY — DX: Essential (primary) hypertension: I10

## 2012-02-11 HISTORY — DX: Type 2 diabetes mellitus without complications: E11.9

## 2012-02-11 LAB — CBC
HCT: 37.4 % — ABNORMAL LOW (ref 39.0–52.0)
Hemoglobin: 12.9 g/dL — ABNORMAL LOW (ref 13.0–17.0)
MCH: 29.8 pg (ref 26.0–34.0)
MCHC: 34.5 g/dL (ref 30.0–36.0)
MCV: 86.4 fL (ref 78.0–100.0)
Platelets: 230 10*3/uL (ref 150–400)
RBC: 4.33 MIL/uL (ref 4.22–5.81)
RDW: 13 % (ref 11.5–15.5)
WBC: 5.1 10*3/uL (ref 4.0–10.5)

## 2012-02-11 LAB — COMPREHENSIVE METABOLIC PANEL WITH GFR
ALT: 20 U/L (ref 0–53)
AST: 18 U/L (ref 0–37)
Albumin: 3.6 g/dL (ref 3.5–5.2)
Alkaline Phosphatase: 84 U/L (ref 39–117)
Glucose, Bld: 227 mg/dL — ABNORMAL HIGH (ref 70–99)
Potassium: 3.9 meq/L (ref 3.5–5.1)
Sodium: 133 meq/L — ABNORMAL LOW (ref 135–145)
Total Protein: 7.3 g/dL (ref 6.0–8.3)

## 2012-02-11 LAB — POCT I-STAT TROPONIN I
Troponin i, poc: 0 ng/mL (ref 0.00–0.08)
Troponin i, poc: 0 ng/mL (ref 0.00–0.08)
Troponin i, poc: 0.02 ng/mL (ref 0.00–0.08)

## 2012-02-11 LAB — COMPREHENSIVE METABOLIC PANEL
BUN: 20 mg/dL (ref 6–23)
CO2: 24 mEq/L (ref 19–32)
Calcium: 9.2 mg/dL (ref 8.4–10.5)
Chloride: 98 mEq/L (ref 96–112)
Creatinine, Ser: 1.48 mg/dL — ABNORMAL HIGH (ref 0.50–1.35)
GFR calc Af Amer: 60 mL/min — ABNORMAL LOW (ref >90)
GFR calc non Af Amer: 52 mL/min — ABNORMAL LOW (ref >90)
Total Bilirubin: 0.4 mg/dL (ref 0.3–1.2)

## 2012-02-11 LAB — GLUCOSE, CAPILLARY: Glucose-Capillary: 217 mg/dL — ABNORMAL HIGH (ref 70–99)

## 2012-02-11 MED ORDER — NITROGLYCERIN IN D5W 200-5 MCG/ML-% IV SOLN
INTRAVENOUS | Status: AC
  Filled 2012-02-11: qty 250

## 2012-02-11 MED ORDER — ONDANSETRON HCL 4 MG/2ML IJ SOLN
4.0000 mg | Freq: Once | INTRAMUSCULAR | Status: AC
  Administered 2012-02-11: 4 mg via INTRAVENOUS
  Filled 2012-02-11: qty 2

## 2012-02-11 MED ORDER — MORPHINE SULFATE 4 MG/ML IJ SOLN
4.0000 mg | Freq: Once | INTRAMUSCULAR | Status: AC
  Administered 2012-02-11: 4 mg via INTRAVENOUS
  Filled 2012-02-11: qty 1

## 2012-02-11 MED ORDER — NITROGLYCERIN 0.4 MG SL SUBL
0.4000 mg | SUBLINGUAL_TABLET | SUBLINGUAL | Status: DC | PRN
  Administered 2012-02-11: 0.4 mg via SUBLINGUAL
  Filled 2012-02-11: qty 25

## 2012-02-11 NOTE — ED Provider Notes (Signed)
Medical screening examination/treatment/procedure(s) were performed by non-physician practitioner and as supervising physician I was immediately available for consultation/collaboration.  Leota Jacobsen, MD 02/11/12 [phone]

## 2012-02-11 NOTE — ED Provider Notes (Signed)
History     CSN: [id number]  Arrival date & time 02/11/12  1157   First MD Initiated Contact with Patient 02/11/12 1202      Chief Complaint  Patient presents with  . Chest Pain    (Consider location/radiation/quality/duration/timing/severity/associated sxs/prior treatment) HPI Comments: Patient with history of diabetes, hypertension, smoking; normal cath in 2004 -- presents with chief complaint of chest pain has been intermittent for the past 3 days. Patient states it feels like a small area of stabbing in his left chest. It has been associated with "excitement" and a feeling like he is going to pass out. No prolonged shortness of breath. He has not had any full syncope. No palpitations. Pain does not radiate. Patient also complains of pain and tingling on the left side of his head. He has had worsening numbness in his toes for the past several months. States that his sugars have been running slightly high. Onset acute. Course is intermittent. Aspirin given prior to arrival.  Patient is a 56 y.o. male presenting with chest pain. The history is provided by the patient.  Chest Pain Pertinent negatives for primary symptoms include no fever, no shortness of breath, no cough, no palpitations, no abdominal pain, no nausea and no vomiting.  Associated symptoms include numbness (bilateral toes).  Pertinent negatives for associated symptoms include no diaphoresis.     Past Medical History  Diagnosis Date  . Diabetes mellitus without complication   . Hypertension   . Schizophrenia     History reviewed. No pertinent past surgical history.  History reviewed. No pertinent family history.  History  Substance Use Topics  . Smoking status: Former Research scientist (life sciences)  . Smokeless tobacco: Not on file  . Alcohol Use: No      Review of Systems  Constitutional: Negative for fever and diaphoresis.  HENT: Negative for neck pain.   Eyes: Negative for redness.  Respiratory: Negative for cough and  shortness of breath.   Cardiovascular: Positive for chest pain. Negative for palpitations and leg swelling.  Gastrointestinal: Negative for nausea, vomiting and abdominal pain.  Genitourinary: Negative for dysuria.  Musculoskeletal: Negative for back pain.  Skin: Negative for rash.  Neurological: Positive for light-headedness, numbness (bilateral toes) and headaches. Negative for syncope.    Allergies  Penicillins  Home Medications   Current Outpatient Rx  Name  Route  Sig  Dispense  Refill  . INSULIN ASPART 100 UNIT/ML [place] SOLN   Subcutaneous   Inject 4 Units into the skin at bedtime.         . INSULIN DETEMIR 100 UNIT/ML [place] SOLN   Subcutaneous   Inject 22-58 Units into the skin 2 (two) times daily. Takes 58 units in am and 22 units in pm         . LISINOPRIL-HYDROCHLOROTHIAZIDE 20-12.5 MG PO TABS   Oral   Take 1 tablet by mouth daily.         Marland Kitchen RISPERIDONE 1 MG PO TABS   Oral   Take 0.5 mg by mouth at bedtime.           BP 124/84  Pulse 84  Temp 97.8 F (36.6 C) (Oral)  Resp 16  SpO2 97%  Physical Exam  Nursing note and vitals reviewed. Constitutional: He is oriented to person, place, and time. He appears well-developed and well-nourished.  HENT:  Head: Normocephalic and atraumatic.  Mouth/Throat: Mucous membranes are normal. Mucous membranes are not dry.  Eyes: Conjunctivae normal are normal.  Neck: Trachea normal  and normal range of motion. Neck supple. Normal carotid pulses and no JVD present. No muscular tenderness present. Carotid bruit is not present. No tracheal deviation present.  Cardiovascular: Normal rate, regular rhythm, S1 normal, S2 normal, normal heart sounds and intact distal pulses.  Exam reveals no distant heart sounds and no decreased pulses.   No murmur heard. Pulmonary/Chest: Effort normal and breath sounds normal. No respiratory distress. He has no wheezes. He exhibits no tenderness.  Abdominal: Soft. Normal aorta and bowel sounds are  normal. There is no tenderness. There is no rebound and no guarding.  Musculoskeletal: He exhibits no edema.  Neurological: He is alert and oriented to person, place, and time. No cranial nerve deficit. Coordination normal.  Skin: Skin is warm and dry. He is not diaphoretic. No cyanosis. No pallor.  Psychiatric: He has a normal mood and affect.    ED Course  Procedures (including critical care time)  Labs Reviewed  GLUCOSE, CAPILLARY - Abnormal; Notable for the following:    Glucose-Capillary 217 (*)     All other components within normal limits  CBC - Abnormal; Notable for the following:    Hemoglobin 12.9 (*)     HCT 37.4 (*)     All other components within normal limits  COMPREHENSIVE METABOLIC PANEL - Abnormal; Notable for the following:    Sodium 133 (*)     Glucose, Bld 227 (*)     Creatinine, Ser 1.48 (*)     GFR calc non Af Amer 52 (*)     GFR calc Af Amer 60 (*)     All other components within normal limits  POCT I-STAT TROPONIN I   Dg Chest 2 View  02/11/2012  *RADIOLOGY REPORT*  Clinical Data: Chest pain for 2 days  CHEST - 2 VIEW  Comparison: Chest x-ray of 01/23/2009  Findings: The lungs are clear.  Mediastinal contours appear stable. The heart is within upper limits of normal.  No acute bony abnormality is seen.  IMPRESSION: Stable chest x-ray.  No active lung disease.   Original Report Authenticated By: Ivar Drape, M.D.      1. Chest pain     12:28 PM Patient seen and examined. Previous records reviewed. Work-up initiated. ASA PTA.   Vital signs reviewed and are as follows: Filed Vitals:   02/11/12 1202  BP: 124/84  Pulse: 84  Temp: 97.8 F (36.6 C)  Resp: 16    Date: 02/11/2012  Rate: 84  Rhythm: normal sinus rhythm  QRS Axis: normal  Intervals: normal  ST/T Wave abnormalities: normal  Conduction Disutrbances:none  Narrative Interpretation:   Old EKG Reviewed: changes noted from 01/23/09 -- former inverted t-waves in III resolved.   D/w Dr.  Dorna Mai who has seen. CP resolved after morphine.   Will move to CDU on CPP for exercise stress in the AM.   4:29 PM Handoff to [facility] NP.    MDM  To CDU pending exercise stress in the morning.        Carlisle Cater, [place] 02/11/12 [place], [place] 02/11/12 [phone]

## 2012-02-11 NOTE — ED Provider Notes (Signed)
Atypical CP, but risks with DM, smoked marijuana 3 weeks ago, pain is left anterior chest wall, not reproducible, troponin is neg, ECG is near normal.  Not diaphoretic, no nausea.  Pain improved now.  Will place in CDU observation for stress test in AM.   Medical screening examination/treatment/procedure(s) were conducted as a shared visit with non-physician practitioner(s) and myself.  I personally evaluated the patient during the encounter   Saddie Benders. Dorna Mai, MD 02/11/12 [phone]

## 2012-02-11 NOTE — ED Notes (Addendum)
Pt presents with lefts sternal CP and "numbness" to left arm and "piercing" pains to Head and Chest. Pt was given 325 of ASA. Nitro X 2.

## 2012-02-11 NOTE — ED Provider Notes (Signed)
Patient in CDU under chest pain protocol.  Patient currently resting comfortably in bed.  No return of chest pain.  Lungs CTA bilaterally.  S1/S2, RRR. Abdomen soft, bowel sounds present.  Strong distal pulses palpated all extremities.  Cardiac markers negative.  12 lead reviewed, no indication of ischemia.  Patient is scheduled for stress echo in AM.  Treatment and diagnostic plan discussed with patient.  Norman Herrlich, NP 02/11/12 2122

## 2012-02-11 NOTE — ED Notes (Signed)
MD at bedside. 

## 2012-02-11 NOTE — ED Notes (Signed)
Patient transported to X-ray 

## 2012-02-12 DIAGNOSIS — R072 Precordial pain: Secondary | ICD-10-CM

## 2012-02-12 LAB — GLUCOSE, CAPILLARY: Glucose-Capillary: 224 mg/dL — ABNORMAL HIGH (ref 70–99)

## 2012-02-12 MED ORDER — HYDROCHLOROTHIAZIDE 12.5 MG PO CAPS
12.5000 mg | ORAL_CAPSULE | Freq: Every day | ORAL | Status: DC
  Administered 2012-02-12: 12.5 mg via ORAL
  Filled 2012-02-12: qty 1

## 2012-02-12 MED ORDER — LISINOPRIL 20 MG PO TABS
20.0000 mg | ORAL_TABLET | Freq: Once | ORAL | Status: AC
  Administered 2012-02-12: 20 mg via ORAL
  Filled 2012-02-12: qty 1

## 2012-02-12 NOTE — Progress Notes (Signed)
  Echocardiogram Echocardiogram Stress Test has been performed.  Jonathan Mcneil 02/12/2012, 10:06 AM

## 2012-02-12 NOTE — Progress Notes (Signed)
Utilization review completed.  P.J. Marygrace Sandoval,RN,BSN Case Manager [phone]  

## 2012-02-12 NOTE — ED Notes (Signed)
IN TO DISCHARGE PT AND HE STATES THE PA GAVE HIM RESULTS OF HIS TESTING BUT DID NOT TELL HIM WHAT WAS WRONG WITH HIS CHEST. REQUESTS TO SEE HER AGAIN. ALSO WANTS SOMETHING TO EAT AND DRINK.

## 2012-02-12 NOTE — ED Notes (Signed)
PA HAS BEEN IN TO TALK WITH PT. HE HAS FINISHED EATING. READY TO GO HOME

## 2012-02-12 NOTE — ED Provider Notes (Signed)
Assumed care of patient in the CDU.  Patient is currently on chest pain protocol.  Plan is for patient to have Cardiac Stress Echo this morning.  Patient presents with intermittent CP that had been occurring for the past 3 days.  He has a history of HTN, DM, and smoking.  No ischemic changes on EKG's.  All troponin's have been negative.  Reassessed patient.  He is resting comfortably at this time.  He denies CP or SOB.  Patient alert and orientated x 3, Heart RRR, Lungs CTAB, Abdomen soft and nontender, 2+ pulses in all extremities, no peripheral edema.   10:50 AM Stress Echo results as follows: Impressions: Stress echo with no chest pain and no ST changes; images technically difficult but no obvious stress-induced wall motion abnormalities.  Patient continues to be CP free.  Discussed results with the patient.  Will discharge patient home.     Sherlyn [place], PA-C 02/12/12 2125

## 2012-02-13 NOTE — ED Provider Notes (Signed)
Medical screening examination/treatment/procedure(s) were performed by non-physician practitioner and as supervising physician I was immediately available for consultation/collaboration.   Mylinda Latina III, MD 02/13/12 [phone]

## 2012-04-24 ENCOUNTER — Ambulatory Visit (HOSPITAL_COMMUNITY): Payer: Medicare Other | Admitting: Psychiatry

## 2013-01-22 ENCOUNTER — Ambulatory Visit: Payer: Self-pay | Admitting: Cardiovascular Disease

## 2013-04-13 ENCOUNTER — Encounter: Payer: Self-pay | Admitting: Cardiovascular Disease

## 2013-04-28 ENCOUNTER — Ambulatory Visit (INDEPENDENT_AMBULATORY_CARE_PROVIDER_SITE_OTHER): Payer: 59 | Admitting: Psychiatry

## 2013-04-28 ENCOUNTER — Encounter (INDEPENDENT_AMBULATORY_CARE_PROVIDER_SITE_OTHER): Payer: Self-pay

## 2013-04-28 DIAGNOSIS — F200 Paranoid schizophrenia: Secondary | ICD-10-CM

## 2013-04-28 DIAGNOSIS — F259 Schizoaffective disorder, unspecified: Secondary | ICD-10-CM | POA: Insufficient documentation

## 2013-04-28 MED ORDER — RISPERIDONE 1 MG PO TABS: 0.5000 mg | ORAL_TABLET | Freq: Every day | ORAL | Status: DC
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
## 2013-06-25 ENCOUNTER — Ambulatory Visit (HOSPITAL_COMMUNITY): Payer: Self-pay | Admitting: Psychiatry

## 2013-07-09 ENCOUNTER — Ambulatory Visit (HOSPITAL_COMMUNITY): Payer: Self-pay | Admitting: Psychiatry

## 2013-08-20 ENCOUNTER — Ambulatory Visit (INDEPENDENT_AMBULATORY_CARE_PROVIDER_SITE_OTHER): Payer: 59 | Admitting: Psychiatry

## 2013-08-20 DIAGNOSIS — F200 Paranoid schizophrenia: Secondary | ICD-10-CM

## 2013-08-20 DIAGNOSIS — F259 Schizoaffective disorder, unspecified: Secondary | ICD-10-CM

## 2013-08-20 MED ORDER — OLANZAPINE 10 MG PO TBDP: 5.0000 mg | ORAL_TABLET | Freq: Every day | ORAL | Status: DC

## 2013-08-20 NOTE — Progress Notes (Signed)
[facility] MD Progress Note  08/20/2013 9:54 AM Jonathan Mcneil  MRN:  [id number] Subjective:  Doing well At this time the patient seems to be fairly stable. He denies any psychotic symptoms. He is not depressed. He is sleeping and eating well. The patient denies any psychotic symptoms. He's got good energy and can concentrate. The patient goes to church every Sunday. The patient does not use any drugs and does not drink any alcohol. The patient does have difficulty being around people. The patient clearly is suspicious even paranoid. The patient is Jonathan Mcneil done fairly well. Is not suicidal is not homicidal. He is very concerned and focused on his diabetes care. He's been followed closely by medical doctor and on his next visit we should get her his lab results. The patient does describe some numbness in his feet but no chest pain or shortness of breath. Unfortunately patient shares with me that he does not take his Risperdal. The patient says it makes him feel that body in eye. The patient has been taking on a when necessary basis but takes it rarely. Given the fact the patient clearly can't define psychotic symptomatology before the use of drugs and alcohol and that he has a long history of mental health care at public mental health systems I do believe this patient has a chronic severe mental illness. I think it is complicated by substance abuse. I taken to important for this patient to be on an antipsychotic medication. The patient says she's tried multiple once in the past but cannot remember them. He said they all made him sick. The patient has never been on Saphris sublabel.    DSM5: Schizophrenia Disorders:  Schizophrenia (295.7) Obsessive-Compulsive Disorders:   Trauma-Stressor Disorders:   Substance/Addictive Disorders:   Depressive Disorders:   Total Time spent with patient:   Axis I: Schizoaffective Disorder  ADL's:  Intact  Sleep: Good  Appetite:  Good  Suicidal Ideation:   no Homicidal Ideation:  no AEB (as evidenced by):  Psychiatric Specialty Exam: Physical Exam  ROS  There were no vitals taken for this visit.There is no weight on file to calculate BMI.  General Appearance: Casual  Eye Contact::  Good  Speech:  Clear and Coherent  Volume:  Normal  Mood:  Euthymic  Affect:  Constricted  Thought Process:  Coherent  Orientation:  Full (Time, Place, and Person)  Thought Content:  WDL  Suicidal Thoughts:  No  Homicidal Thoughts:  No  Memory:  NA  Judgement:  Good  Insight:  Good  Psychomotor Activity:  Normal  Concentration:  Good  Recall:  Good  Fund of Knowledge:Good  Language: Good  Akathisia:  No  Handed:  Right  AIMS (if indicated):     Assets:  Desire for Improvement  Sleep:      Musculoskeletal: Strength & Muscle Tone:  Gait & Station:  Patient leans:   Current Medications: Current Outpatient Prescriptions  Medication Sig Dispense Refill  . insulin aspart (NOVOLOG) 100 UNIT/ML injection Inject 4 Units into the skin at bedtime.      . insulin detemir (LEVEMIR) 100 UNIT/ML injection Inject 22-58 Units into the skin 2 (two) times daily. Takes 58 units in am and 22 units in pm      . lisinopril-hydrochlorothiazide (PRINZIDE,ZESTORETIC) 20-12.5 MG per tablet Take 1 tablet by mouth daily.      Marland Kitchen OLANZapine zydis (ZYPREXA) 10 MG disintegrating tablet Take 0.5 tablets (5 mg total) by mouth at bedtime. 1 Qhs  30  tablet  5  . risperiDONE (RISPERDAL) 1 MG tablet Take 0.5 tablets (0.5 mg total) by mouth at bedtime.  30 tablet  4   No current facility-administered medications for this visit.    Lab Results: No results found for this or any previous visit (from the past 48 hour(s)).  Physical Findings: AIMS:  , ,  ,  ,    CIWA:    COWS:     Treatment Plan Summary: At this time we'll officially discontinue his Risperdal. The patient will begin on Saphris. I will give it to him just one pill at night we'll start off with 10 mg each  night. The patient has disability forms to be filled out which I will do my best with but I will need his attention on his next visit. Unfortunately these disability forms were not available for me as they were put away because this patient missed 2 visits where we are supposed to fill them out. Therefore they were not the first thing for [place] to consider and to talk about today. There were other important issues. On his next visit we she'll look at these disability forms. This patient to return to see me in 5 weeks   Plan:  Medical Decision Making Problem Points:  Established problem, worsening (2) Data Points:  Review of medication regiment & side effects (2)  I certify that inpatient services furnished can reasonably be expected to improve the patient's condition.   Berneta [facility] 08/20/2013, 9:54 AM

## 2013-08-24 ENCOUNTER — Other Ambulatory Visit (HOSPITAL_COMMUNITY): Payer: Self-pay | Admitting: *Deleted

## 2013-08-24 DIAGNOSIS — F200 Paranoid schizophrenia: Secondary | ICD-10-CM

## 2013-08-24 MED ORDER — ASENAPINE MALEATE 10 MG SL SUBL: 10.0000 mg | SUBLINGUAL_TABLET | Freq: Every day | SUBLINGUAL | Status: DC

## 2013-08-24 NOTE — Telephone Encounter (Signed)
Fax from [facility] requests clarification of Zyprexa order :MD ordered 10 mg tablet, take 1/2 tablet at bedtime.Note"Cannot split ODT tablet. Do you want 5 mg tablet for __days and then 1 tablet at Bedtime, or what?"  Contacted MD 07/23/13 at 11:50 with question posed by pharmacy.Also - In progress note from 08/20/13 appt, MD discussed in plan using Saphris for patient, not Zyprexa.  Dr. Casimiro Needle called 08/24/13 @1336 : Order was to be for Saphris 10 mg, Black cherry, take at beditme - do not eat or drink 30 minutes before or after using medication, #30 with 5 refills. Discontinue any order for Zyprexa. Pharmacy contacted, new orders given to d/c Zyprexa and dose/directions/quantity for Saphris given.   Phoned patient: Informed patient medication called to pharmacy.Instructed not to eat or drink 30 minutes before or after medication.

## 2013-08-25 ENCOUNTER — Encounter (HOSPITAL_COMMUNITY): Payer: Self-pay | Admitting: *Deleted

## 2013-08-25 NOTE — Progress Notes (Signed)
[facility] authorized Saphris 10 mg sublingual. Effective 08/25/13 thru 08/26/14 Pharmacy notified by fax

## 2013-10-15 ENCOUNTER — Ambulatory Visit (HOSPITAL_COMMUNITY): Payer: Self-pay | Admitting: Psychiatry

## 2014-02-09 ENCOUNTER — Ambulatory Visit (INDEPENDENT_AMBULATORY_CARE_PROVIDER_SITE_OTHER): Payer: Self-pay

## 2014-02-09 ENCOUNTER — Ambulatory Visit (INDEPENDENT_AMBULATORY_CARE_PROVIDER_SITE_OTHER): Payer: Medicare Other | Admitting: Neurology

## 2014-02-09 DIAGNOSIS — R202 Paresthesia of skin: Secondary | ICD-10-CM

## 2014-02-09 DIAGNOSIS — R748 Abnormal levels of other serum enzymes: Secondary | ICD-10-CM

## 2014-02-09 DIAGNOSIS — Z0289 Encounter for other administrative examinations: Secondary | ICD-10-CM

## 2014-02-09 NOTE — Progress Notes (Addendum)
    [facility]    Provider:  Dr Jaynee Eagles Referring Provider: Merrilee Seashore, MD Primary Care Physician:  Merrilee Seashore, MD  HPI:  Jonathan Mcneil is a 58 y.o. male here as a referral from Dr. Ashby Dawes for evaluation of myopathy or myositis.  Per patient, his PCP referred him to Dr. Amil Amen for evaluation of elevated CK. Patient denies weakness, denies problems walking, no difficulty getting out of low seats or climbing stairs. No weakness lifting arms overhead. Denies rashes. His feet get numb and they get cold at night. No painful muscles or muscles cramps. Denies SOB, vision problems. Denies neck pain, LBP or radicular symptoms. Focused exam demonstrates 5/5 proximal and distal strength. CK 974.   Summary  Nerve conduction studies were performed on the bilateral lower extremities:  The left Peroneal motor nerve showed normal conductions with delayed F wave latency(61.32ms, N<56) The right Peroneal motor nerve showed normal conductions with delayed F wave latency(76ms, N<56) The left Tibial motor nerve showed normal conductions with delayed F wave latency(62.85ms, N<58) The right Tibial motor nerve showed normal conductions with delayed F wave latency(64.10ms, N<58) Bilateral Peroneal and Sural sensory nerve conductions were within normal limits Bilateral H Reflexes showed normal latencies  EMG Needle study was performed on selected right-sided  muscles:   The Deltoid, Triceps, Biceps, Pronator Teres, First Dorsal interosseous, Iliopsoas, Vastus Medialis, Anterior Tibialis, Medial Gastrocnemius, Extensor Hallucis Longus and T10 paraspinal muscles were within normal limits.  Conclusion:  EMG needle study was normal. There is no electrophysiologic evidence for myopathy or myositis. EMG/NCS can be negative in early myositis as well as due to sampling error. If patient develops symptoms, suggest repeating the EMG/ NCS as clinically warranted. Bilaterally  delayed F waves can be seen in polyneuropathy. Clinical correlation recommended.   Sarina Ill, MD  [facility] [address] [place], [place] [address]  Phone [phone] Fax [phone]  Lenor Coffin

## 2014-05-10 DIAGNOSIS — E1165 Type 2 diabetes mellitus with hyperglycemia: Secondary | ICD-10-CM | POA: Diagnosis not present

## 2014-05-12 DIAGNOSIS — E1065 Type 1 diabetes mellitus with hyperglycemia: Secondary | ICD-10-CM | POA: Diagnosis not present

## 2014-05-17 DIAGNOSIS — E1021 Type 1 diabetes mellitus with diabetic nephropathy: Secondary | ICD-10-CM | POA: Diagnosis not present

## 2014-05-17 DIAGNOSIS — E782 Mixed hyperlipidemia: Secondary | ICD-10-CM | POA: Diagnosis not present

## 2014-05-17 DIAGNOSIS — I10 Essential (primary) hypertension: Secondary | ICD-10-CM | POA: Diagnosis not present

## 2014-08-08 DIAGNOSIS — E1065 Type 1 diabetes mellitus with hyperglycemia: Secondary | ICD-10-CM | POA: Diagnosis not present

## 2014-08-15 DIAGNOSIS — R5383 Other fatigue: Secondary | ICD-10-CM | POA: Diagnosis not present

## 2014-08-15 DIAGNOSIS — R197 Diarrhea, unspecified: Secondary | ICD-10-CM | POA: Diagnosis not present

## 2014-08-15 DIAGNOSIS — E1165 Type 2 diabetes mellitus with hyperglycemia: Secondary | ICD-10-CM | POA: Diagnosis not present

## 2014-09-06 DIAGNOSIS — E1021 Type 1 diabetes mellitus with diabetic nephropathy: Secondary | ICD-10-CM | POA: Diagnosis not present

## 2014-09-06 DIAGNOSIS — E782 Mixed hyperlipidemia: Secondary | ICD-10-CM | POA: Diagnosis not present

## 2014-09-13 DIAGNOSIS — E782 Mixed hyperlipidemia: Secondary | ICD-10-CM | POA: Diagnosis not present

## 2014-09-13 DIAGNOSIS — E1021 Type 1 diabetes mellitus with diabetic nephropathy: Secondary | ICD-10-CM | POA: Diagnosis not present

## 2014-09-13 DIAGNOSIS — E1165 Type 2 diabetes mellitus with hyperglycemia: Secondary | ICD-10-CM | POA: Diagnosis not present

## 2014-09-19 ENCOUNTER — Telehealth (HOSPITAL_COMMUNITY): Payer: Self-pay | Admitting: *Deleted

## 2014-09-19 NOTE — Telephone Encounter (Signed)
Jonathan Mcneil called to find out what happened with the disability paperwork that was dropped off here months ago. States they dropped it off to be filled out and have not heard back. Would like a call back letting them know if and when they can pick up the completed paperwork.

## 2014-10-10 ENCOUNTER — Encounter (HOSPITAL_COMMUNITY): Payer: Self-pay | Admitting: Emergency Medicine

## 2014-10-10 ENCOUNTER — Emergency Department (HOSPITAL_COMMUNITY)
Admission: EM | Admit: 2014-10-10 | Discharge: 2014-10-10 | Disposition: A | Discharge status: Home / Self Care | Payer: Medicare Other | Attending: Emergency Medicine | Admitting: Emergency Medicine

## 2014-10-10 DIAGNOSIS — M25571 Pain in right ankle and joints of right foot: Secondary | ICD-10-CM | POA: Diagnosis present

## 2014-10-10 DIAGNOSIS — M79672 Pain in left foot: Secondary | ICD-10-CM | POA: Diagnosis not present

## 2014-10-10 DIAGNOSIS — E119 Type 2 diabetes mellitus without complications: Secondary | ICD-10-CM | POA: Diagnosis not present

## 2014-10-10 DIAGNOSIS — I10 Essential (primary) hypertension: Secondary | ICD-10-CM | POA: Insufficient documentation

## 2014-10-10 DIAGNOSIS — R1084 Generalized abdominal pain: Secondary | ICD-10-CM | POA: Diagnosis not present

## 2014-10-10 DIAGNOSIS — R202 Paresthesia of skin: Secondary | ICD-10-CM | POA: Diagnosis not present

## 2014-10-10 DIAGNOSIS — M79642 Pain in left hand: Secondary | ICD-10-CM | POA: Insufficient documentation

## 2014-10-10 DIAGNOSIS — Z87891 Personal history of nicotine dependence: Secondary | ICD-10-CM | POA: Insufficient documentation

## 2014-10-10 DIAGNOSIS — Z79899 Other long term (current) drug therapy: Secondary | ICD-10-CM | POA: Diagnosis not present

## 2014-10-10 DIAGNOSIS — R51 Headache: Secondary | ICD-10-CM | POA: Diagnosis not present

## 2014-10-10 DIAGNOSIS — M79671 Pain in right foot: Secondary | ICD-10-CM | POA: Diagnosis not present

## 2014-10-10 DIAGNOSIS — Z794 Long term (current) use of insulin: Secondary | ICD-10-CM | POA: Insufficient documentation

## 2014-10-10 DIAGNOSIS — M79641 Pain in right hand: Secondary | ICD-10-CM | POA: Insufficient documentation

## 2014-10-10 DIAGNOSIS — F1210 Cannabis abuse, uncomplicated: Secondary | ICD-10-CM | POA: Insufficient documentation

## 2014-10-10 DIAGNOSIS — R200 Anesthesia of skin: Secondary | ICD-10-CM | POA: Insufficient documentation

## 2014-10-10 DIAGNOSIS — F209 Schizophrenia, unspecified: Secondary | ICD-10-CM | POA: Insufficient documentation

## 2014-10-10 DIAGNOSIS — Z880 Allergy status to penicillin: Secondary | ICD-10-CM | POA: Insufficient documentation

## 2014-10-10 LAB — CBC WITH DIFFERENTIAL/PLATELET
BASOS ABS: 0 10*3/uL (ref 0.0–0.1)
Basophils Relative: 1 % (ref 0–1)
EOS PCT: 7 % — AB (ref 0–5)
Eosinophils Absolute: 0.4 10*3/uL (ref 0.0–0.7)
HEMATOCRIT: 39.5 % (ref 39.0–52.0)
HEMOGLOBIN: 13 g/dL (ref 13.0–17.0)
LYMPHS ABS: 2 10*3/uL (ref 0.7–4.0)
Lymphocytes Relative: 36 % (ref 12–46)
MCH: 28.7 pg (ref 26.0–34.0)
MCHC: 32.9 g/dL (ref 30.0–36.0)
MCV: 87.2 fL (ref 78.0–100.0)
Monocytes Absolute: 0.3 10*3/uL (ref 0.1–1.0)
Monocytes Relative: 5 % (ref 3–12)
NEUTROS PCT: 52 % (ref 43–77)
Neutro Abs: 2.8 10*3/uL (ref 1.7–7.7)
PLATELETS: 249 10*3/uL (ref 150–400)
RBC: 4.53 MIL/uL (ref 4.22–5.81)
RDW: 14.8 % (ref 11.5–15.5)
WBC: 5.4 10*3/uL (ref 4.0–10.5)

## 2014-10-10 LAB — COMPREHENSIVE METABOLIC PANEL
ALBUMIN: 4.2 g/dL (ref 3.5–5.0)
ALK PHOS: 75 U/L (ref 38–126)
ALT: 24 U/L (ref 17–63)
AST: 26 U/L (ref 15–41)
Anion gap: 11 (ref 5–15)
BUN: 32 mg/dL — ABNORMAL HIGH (ref 6–20)
CO2: 20 mmol/L — AB (ref 22–32)
CREATININE: 1.76 mg/dL — AB (ref 0.61–1.24)
Calcium: 9.4 mg/dL (ref 8.9–10.3)
Chloride: 104 mmol/L (ref 101–111)
GFR calc Af Amer: 47 mL/min — ABNORMAL LOW (ref >60)
GFR calc non Af Amer: 41 mL/min — ABNORMAL LOW (ref >60)
Glucose, Bld: 155 mg/dL — ABNORMAL HIGH (ref 65–99)
POTASSIUM: 4.8 mmol/L (ref 3.5–5.1)
Sodium: 135 mmol/L (ref 135–145)
TOTAL PROTEIN: 7.7 g/dL (ref 6.5–8.1)
Total Bilirubin: 0.8 mg/dL (ref 0.3–1.2)

## 2014-10-10 LAB — RAPID URINE DRUG SCREEN, HOSP PERFORMED
Amphetamines: NOT DETECTED
Barbiturates: NOT DETECTED
Benzodiazepines: NOT DETECTED
Cocaine: NOT DETECTED
Opiates: NOT DETECTED
Tetrahydrocannabinol: POSITIVE — AB

## 2014-10-10 LAB — URINALYSIS, ROUTINE W REFLEX MICROSCOPIC
Bilirubin Urine: NEGATIVE
Glucose, UA: >1000 mg/dL — AB
Ketones, ur: NEGATIVE mg/dL
LEUKOCYTES UA: NEGATIVE
Nitrite: NEGATIVE
PROTEIN: 30 mg/dL — AB
Specific Gravity, Urine: 1.016 (ref 1.005–1.030)
UROBILINOGEN UA: 0.2 mg/dL (ref 0.0–1.0)
pH: 5.5 (ref 5.0–8.0)

## 2014-10-10 LAB — CBG MONITORING, ED: Glucose-Capillary: 167 mg/dL — ABNORMAL HIGH (ref 65–99)

## 2014-10-10 LAB — LIPASE, BLOOD: LIPASE: 83 U/L — AB (ref 22–51)

## 2014-10-10 LAB — URINE MICROSCOPIC-ADD ON

## 2014-10-10 MED ORDER — ACETAMINOPHEN 325 MG PO TABS
650.0000 mg | ORAL_TABLET | Freq: Once | ORAL | Status: AC
  Administered 2014-10-10: 650 mg via ORAL
  Filled 2014-10-10: qty 2

## 2014-10-10 NOTE — ED Notes (Signed)
Patient states started having leg pain yesterday.   Patient states "I'm a diabetic and it's like nerve pain".   Patient states he took his sugar this morning and it was 210, but didn't treat with anything.   Patient states he has primary doctor, but "this is like an emergency for me.   I don't think he can handle it".

## 2014-10-10 NOTE — ED Notes (Signed)
Patient states "this all started when I smoke that marijuana.  Until I smoked that everything was fine".

## 2014-10-10 NOTE — Discharge Instructions (Signed)

## 2014-10-10 NOTE — ED Provider Notes (Signed)
CSN: [id number]     Arrival date & time 10/10/14  1243 History   First MD Initiated Contact with Patient 10/10/14 1305     Chief Complaint  Patient presents with  . Leg Pain   (Consider location/radiation/quality/duration/timing/severity/associated sxs/prior Treatment) HPI  Pt is a 59 yo male with a hx of schizophrenia, diabetes, and hypertension presented today for increasing extremity numbness and pain. Patient reports his right foot began tingling two days ago associated with pain.  This progressed to his left foot and bilateral hands. Denies any weakness in the effected extremities.  He denies any trauma to the area or previous similar episodes. It has remained stable over the last two days and has not progressed.  Reports no other recent illnesses and had shingles vaccine one month prior.  Pt states mostly a tingling pain throughout his hands with no aggravating or alleviating factors.  This has been continuous and not progressive.  Reports that his diabetes is under control at this time. Patient attributes these feelings to smoking marijuana.  Also complains of mild, crampy, generalized abdominal pain associated with no n/v, melena, or diarrhea.  States he began having a mild frontal headache upon presentation to the ED.   Past Medical History  Diagnosis Date  . Diabetes mellitus without complication   . Hypertension   . Schizophrenia    History reviewed. No pertinent past surgical history. No family history on file. History  Substance Use Topics  . Smoking status: Former Research scientist (life sciences)  . Smokeless tobacco: Not on file  . Alcohol Use: No    Review of Systems  Constitutional: Negative for fever and chills.  HENT: Negative for congestion and sore throat.   Eyes: Negative for pain.  Respiratory: Negative for cough and shortness of breath.   Cardiovascular: Negative for chest pain, palpitations and leg swelling.  Gastrointestinal: Positive for abdominal pain. Negative for nausea, vomiting,  diarrhea and blood in stool.  Endocrine: Negative.   Genitourinary: Negative for flank pain.  Musculoskeletal: Negative for back pain and neck pain.  Skin: Negative for rash.  Allergic/Immunologic: Negative.   Neurological: Positive for numbness and headaches. Negative for dizziness, tremors, seizures, syncope, facial asymmetry, speech difficulty, weakness and light-headedness.       Tingling pain of hands and feet   Psychiatric/Behavioral: Negative for confusion and agitation.   Allergies  Penicillins  Home Medications   Prior to Admission medications   Medication Sig Start Date End Date Taking? Authorizing Provider  Asenapine Maleate 10 MG SUBL Place 1 tablet (10 mg total) under the tongue at bedtime. Do not eat or drink for 30 minutes before or after taking medication 08/24/13  Yes Norma Fredrickson, MD  insulin detemir (LEVEMIR) 100 UNIT/ML injection Inject 12-62 Units into the skin 2 (two) times daily. Pt takes 62 units in the morning, and 12 units in the evening   Yes Historical Provider, MD  lisinopril-hydrochlorothiazide (PRINZIDE,ZESTORETIC) 20-12.5 MG per tablet Take 1 tablet by mouth daily.   Yes Historical Provider, MD   BP 121/82 mmHg  Pulse 63  Temp(Src) 98.4 F (36.9 C) (Oral)  Resp 13  Ht 5\' 8"  (1.727 m)  SpO2 100% Physical Exam  Constitutional: He is oriented to person, place, and time. He appears well-developed and well-nourished.  HENT:  Head: Normocephalic and atraumatic.  Eyes: Conjunctivae and EOM are normal. Pupils are equal, round, and reactive to light.  Neck: Normal range of motion. Neck supple.  Cardiovascular: Normal rate, regular rhythm, normal heart sounds and intact  distal pulses.   Pulses:      Radial pulses are 2+ on the right side, and 2+ on the left side.       Dorsalis pedis pulses are 2+ on the right side, and 2+ on the left side.       Posterior tibial pulses are 2+ on the right side, and 2+ on the left side.  Pulmonary/Chest: Effort normal and  breath sounds normal. No respiratory distress.  Abdominal: Soft. Bowel sounds are normal. There is no tenderness.  Musculoskeletal: Normal range of motion.       Right hand: He exhibits tenderness. He exhibits normal range of motion, no bony tenderness, normal capillary refill, no deformity, no laceration and no swelling. Normal sensation noted. Normal strength noted.       Left hand: He exhibits tenderness. He exhibits normal range of motion, no bony tenderness, normal capillary refill and no swelling. Normal sensation noted. Normal strength noted.       Right foot: There is tenderness. There is normal range of motion, no bony tenderness, no swelling, no crepitus, no deformity and no laceration.       Left foot: There is tenderness. There is no bony tenderness, no swelling, normal capillary refill, no crepitus, no deformity and no laceration.  Neurological: He is alert and oriented to person, place, and time. He has normal strength and normal reflexes. He displays normal reflexes. No cranial nerve deficit or sensory deficit. He displays a negative Romberg sign.  Normal finger to nose bilaterally.  Rapid alternating movements intact bilaterally.  Normal heal to shin bilaterally.   No pronator drift bilaterally.   Normal space orientation of big toe bilaterally. Subjective tingling pain to palpation of feet bilaterally.  Right up to mid shin, left up to ankle.  Hands to thenar eminence bilaterally.    Skin: Skin is warm and dry.   ED Course  Procedures (including critical care time) Labs Review Labs Reviewed  CBC WITH DIFFERENTIAL/PLATELET - Abnormal; Notable for the following:    Eosinophils Relative 7 (*)    All other components within normal limits  COMPREHENSIVE METABOLIC PANEL - Abnormal; Notable for the following:    CO2 20 (*)    Glucose, Bld 155 (*)    BUN 32 (*)    Creatinine, Ser 1.76 (*)    GFR calc non Af Amer 41 (*)    GFR calc Af Amer 47 (*)    All other components within  normal limits  LIPASE, BLOOD - Abnormal; Notable for the following:    Lipase 83 (*)    All other components within normal limits  URINALYSIS, ROUTINE W REFLEX MICROSCOPIC (NOT AT [facility]) - Abnormal; Notable for the following:    Glucose, UA >1000 (*)    Hgb urine dipstick TRACE (*)    Protein, ur 30 (*)    All other components within normal limits  URINE RAPID DRUG SCREEN, HOSP PERFORMED - Abnormal; Notable for the following:    Tetrahydrocannabinol POSITIVE (*)    All other components within normal limits  CBG MONITORING, ED - Abnormal; Notable for the following:    Glucose-Capillary 167 (*)    All other components within normal limits  URINE CULTURE  URINE MICROSCOPIC-ADD ON   Imaging Review No results found.   EKG Interpretation   Date/Time:  Monday October 10 2014 14:24:00 EDT Ventricular Rate:  64 PR Interval:  149 QRS Duration: 86 QT Interval:  399 QTC Calculation: 412 R Axis:  68 Text Interpretation:  Sinus rhythm Borderline low voltage, extremity leads  ED PHYSICIAN INTERPRETATION AVAILABLE IN [facility] Confirmed by  TEST, Record ([id number]) on 10/11/2014 6:48:24 AM     MDM   Final diagnoses:  Numbness and tingling    Pt is a 59 yo male with a hx of schizophrenia, diabetes, and hypertension presented today for increasing extremity numbness and pain of feet and hands bilaterally over last two days.   Ddx diabetic peripheral neuropathy, stroke/TIA, Guillian-Barre, toxic neuropathy, transverse myelitis, vascular insufficiency, MS.   On evaluation pt HDS in NAD.    Headache only mild and alleviated with tylenol in ED with non-focal neurologic examination.  Abdomen with no signs of peritonitis.  Pain in vague and generalized with no radiation.  Pt describes as mild and was resolved upon discharge.  Lipase mildly elevated but no hx factors consistent with pancreatitis or GB pathology. CMP with elevated Cr but previously elevated 2 years prior and unknown what current  baseline is.  Advised to f/u with PCP for repeat Cr and encouraged hydration.  No elevated LFTs, leukocytosis or anemia.    Pt did describe subjective paraesthesias of hands and feet bilaterally with increased pain to palpation.  Hands and feet neurovascularly intact with no frank dermatomal pattern.  Good cap refill in fingers and toes. No increased pain as he walks or uses hands/feet and doubt vascular insufficiency/claudication.  Glucose elevated but not grossly elevated.  Does has large glucose in urine but no ketones and doubt DKA.  Symptoms are bilateral and gradual in onset and not characteristic of stroke or TIA.  Do not feel CT head necessary at this time. No saddle anesthesia or bowl/bladder incontinence.  Symptoms only in peripheral extremities and doubt MS.   Denies any alcohol use but unsure about other exposure to heavy metals but doubt toxic exposure as etiology.  Reports well rounded diet and doubt vitamin deficiency.  Pt does reports having vaccination on month prior.  Pt describing more pain in extremities and continues to have full motor function and sensation of feet and hands.  At this point symptoms non-specific and advised to f/u with PCP in the morning if pain does not resolve for possible MRI.  Advised to return to ED as well if unable to gain appointment and symptoms continue for 1-2 days.   If performed, labs, EKGs, and imaging were reviewed/interpreted by myself and my attending and incorporated into medical decision making.  Discussed pertinent finding with patient or caregiver prior to discharge with no further questions.  Immediate return precautions given and pt or caregiver reports understanding.  Pt care supervised by my attending Dr. Loistine Chance, MD PGY-2  Emergency Medicine    Geronimo Boot, MD 10/11/14 [place], MD 10/11/14 [phone]

## 2014-10-10 NOTE — ED Notes (Addendum)
Pt states all symptoms started after smoking weed last night -- states does smoke every night and has not had any other problems with smoking weed in past. Also c/o legs "burning"  And headache. Alert/oriented x 4,

## 2014-10-11 LAB — URINE CULTURE: CULTURE: NO GROWTH

## 2014-11-04 DIAGNOSIS — E1065 Type 1 diabetes mellitus with hyperglycemia: Secondary | ICD-10-CM | POA: Diagnosis not present

## 2015-01-10 DIAGNOSIS — E1165 Type 2 diabetes mellitus with hyperglycemia: Secondary | ICD-10-CM | POA: Diagnosis not present

## 2015-01-10 DIAGNOSIS — E782 Mixed hyperlipidemia: Secondary | ICD-10-CM | POA: Diagnosis not present

## 2015-01-17 DIAGNOSIS — E782 Mixed hyperlipidemia: Secondary | ICD-10-CM | POA: Diagnosis not present

## 2015-01-17 DIAGNOSIS — Z23 Encounter for immunization: Secondary | ICD-10-CM | POA: Diagnosis not present

## 2015-01-17 DIAGNOSIS — I10 Essential (primary) hypertension: Secondary | ICD-10-CM | POA: Diagnosis not present

## 2015-01-17 DIAGNOSIS — E1165 Type 2 diabetes mellitus with hyperglycemia: Secondary | ICD-10-CM | POA: Diagnosis not present

## 2015-01-17 DIAGNOSIS — E1021 Type 1 diabetes mellitus with diabetic nephropathy: Secondary | ICD-10-CM | POA: Diagnosis not present

## 2015-01-30 DIAGNOSIS — E1065 Type 1 diabetes mellitus with hyperglycemia: Secondary | ICD-10-CM | POA: Diagnosis not present

## 2015-02-14 ENCOUNTER — Observation Stay (HOSPITAL_COMMUNITY): Payer: Medicare Other

## 2015-02-14 ENCOUNTER — Encounter (HOSPITAL_COMMUNITY): Payer: Self-pay

## 2015-02-14 ENCOUNTER — Inpatient Hospital Stay (HOSPITAL_COMMUNITY)
Admission: EM | Admit: 2015-02-14 | Discharge: 2015-02-16 | DRG: 438 | Disposition: A | Discharge status: Home / Self Care | Payer: Medicare Other | Attending: Internal Medicine | Admitting: Internal Medicine

## 2015-02-14 DIAGNOSIS — Z794 Long term (current) use of insulin: Secondary | ICD-10-CM | POA: Diagnosis not present

## 2015-02-14 DIAGNOSIS — E119 Type 2 diabetes mellitus without complications: Secondary | ICD-10-CM | POA: Diagnosis not present

## 2015-02-14 DIAGNOSIS — N179 Acute kidney failure, unspecified: Secondary | ICD-10-CM | POA: Diagnosis not present

## 2015-02-14 DIAGNOSIS — F259 Schizoaffective disorder, unspecified: Secondary | ICD-10-CM | POA: Diagnosis not present

## 2015-02-14 DIAGNOSIS — E871 Hypo-osmolality and hyponatremia: Secondary | ICD-10-CM | POA: Diagnosis not present

## 2015-02-14 DIAGNOSIS — E669 Obesity, unspecified: Secondary | ICD-10-CM | POA: Diagnosis not present

## 2015-02-14 DIAGNOSIS — K859 Acute pancreatitis without necrosis or infection, unspecified: Secondary | ICD-10-CM | POA: Diagnosis present

## 2015-02-14 DIAGNOSIS — N183 Chronic kidney disease, stage 3 (moderate): Secondary | ICD-10-CM | POA: Diagnosis not present

## 2015-02-14 DIAGNOSIS — Z6832 Body mass index (BMI) 32.0-32.9, adult: Secondary | ICD-10-CM

## 2015-02-14 DIAGNOSIS — M47816 Spondylosis without myelopathy or radiculopathy, lumbar region: Secondary | ICD-10-CM | POA: Diagnosis not present

## 2015-02-14 DIAGNOSIS — I10 Essential (primary) hypertension: Secondary | ICD-10-CM | POA: Diagnosis not present

## 2015-02-14 DIAGNOSIS — E1140 Type 2 diabetes mellitus with diabetic neuropathy, unspecified: Secondary | ICD-10-CM | POA: Diagnosis present

## 2015-02-14 DIAGNOSIS — R109 Unspecified abdominal pain: Secondary | ICD-10-CM | POA: Diagnosis not present

## 2015-02-14 DIAGNOSIS — Z87891 Personal history of nicotine dependence: Secondary | ICD-10-CM

## 2015-02-14 DIAGNOSIS — E66811 Obesity, class 1: Secondary | ICD-10-CM | POA: Diagnosis present

## 2015-02-14 DIAGNOSIS — E1122 Type 2 diabetes mellitus with diabetic chronic kidney disease: Secondary | ICD-10-CM | POA: Diagnosis present

## 2015-02-14 DIAGNOSIS — I129 Hypertensive chronic kidney disease with stage 1 through stage 4 chronic kidney disease, or unspecified chronic kidney disease: Secondary | ICD-10-CM | POA: Diagnosis not present

## 2015-02-14 DIAGNOSIS — K858 Other acute pancreatitis without necrosis or infection: Secondary | ICD-10-CM

## 2015-02-14 DIAGNOSIS — N189 Chronic kidney disease, unspecified: Secondary | ICD-10-CM | POA: Diagnosis present

## 2015-02-14 DIAGNOSIS — R1010 Upper abdominal pain, unspecified: Secondary | ICD-10-CM | POA: Diagnosis not present

## 2015-02-14 DIAGNOSIS — N289 Disorder of kidney and ureter, unspecified: Secondary | ICD-10-CM

## 2015-02-14 DIAGNOSIS — G629 Polyneuropathy, unspecified: Secondary | ICD-10-CM

## 2015-02-14 DIAGNOSIS — M549 Dorsalgia, unspecified: Secondary | ICD-10-CM | POA: Diagnosis not present

## 2015-02-14 DIAGNOSIS — G9340 Encephalopathy, unspecified: Secondary | ICD-10-CM | POA: Diagnosis present

## 2015-02-14 DIAGNOSIS — N182 Chronic kidney disease, stage 2 (mild): Secondary | ICD-10-CM | POA: Diagnosis not present

## 2015-02-14 DIAGNOSIS — Z79899 Other long term (current) drug therapy: Secondary | ICD-10-CM | POA: Diagnosis not present

## 2015-02-14 DIAGNOSIS — E860 Dehydration: Secondary | ICD-10-CM | POA: Diagnosis present

## 2015-02-14 HISTORY — DX: Acute pancreatitis without necrosis or infection, unspecified: K85.90

## 2015-02-14 HISTORY — DX: Low back pain: M54.5

## 2015-02-14 HISTORY — DX: Low back pain, unspecified: M54.50

## 2015-02-14 LAB — COMPREHENSIVE METABOLIC PANEL
ALT: 35 U/L (ref 17–63)
ANION GAP: 10 (ref 5–15)
AST: 31 U/L (ref 15–41)
Albumin: 4 g/dL (ref 3.5–5.0)
Alkaline Phosphatase: 78 U/L (ref 38–126)
BUN: 38 mg/dL — ABNORMAL HIGH (ref 6–20)
CHLORIDE: 98 mmol/L — AB (ref 101–111)
CO2: 22 mmol/L (ref 22–32)
Calcium: 9 mg/dL (ref 8.9–10.3)
Creatinine, Ser: 2.22 mg/dL — ABNORMAL HIGH (ref 0.61–1.24)
GFR, EST AFRICAN AMERICAN: 36 mL/min — AB (ref >60)
GFR, EST NON AFRICAN AMERICAN: 31 mL/min — AB (ref >60)
Glucose, Bld: 149 mg/dL — ABNORMAL HIGH (ref 65–99)
POTASSIUM: 4.2 mmol/L (ref 3.5–5.1)
Sodium: 130 mmol/L — ABNORMAL LOW (ref 135–145)
TOTAL PROTEIN: 7.7 g/dL (ref 6.5–8.1)
Total Bilirubin: 0.5 mg/dL (ref 0.3–1.2)

## 2015-02-14 LAB — CBC
HEMATOCRIT: 41.5 % (ref 39.0–52.0)
HEMOGLOBIN: 13.5 g/dL (ref 13.0–17.0)
MCH: 28.5 pg (ref 26.0–34.0)
MCHC: 32.5 g/dL (ref 30.0–36.0)
MCV: 87.6 fL (ref 78.0–100.0)
Platelets: 265 10*3/uL (ref 150–400)
RBC: 4.74 MIL/uL (ref 4.22–5.81)
RDW: 13.7 % (ref 11.5–15.5)
WBC: 6.4 10*3/uL (ref 4.0–10.5)

## 2015-02-14 LAB — URINALYSIS, ROUTINE W REFLEX MICROSCOPIC
Bilirubin Urine: NEGATIVE
GLUCOSE, UA: 250 mg/dL — AB
Ketones, ur: NEGATIVE mg/dL
LEUKOCYTES UA: NEGATIVE
NITRITE: NEGATIVE
PH: 5 (ref 5.0–8.0)
Protein, ur: NEGATIVE mg/dL
Specific Gravity, Urine: 1.009 (ref 1.005–1.030)

## 2015-02-14 LAB — LIPID PANEL
CHOLESTEROL: 207 mg/dL — AB (ref 0–200)
HDL: 32 mg/dL — AB (ref >40)
LDL CALC: 123 mg/dL — AB (ref 0–99)
TRIGLYCERIDES: 262 mg/dL — AB (ref <150)
Total CHOL/HDL Ratio: 6.5 RATIO
VLDL: 52 mg/dL — AB (ref 0–40)

## 2015-02-14 LAB — URINE MICROSCOPIC-ADD ON

## 2015-02-14 LAB — GLUCOSE, CAPILLARY: Glucose-Capillary: 86 mg/dL (ref 65–99)

## 2015-02-14 LAB — LIPASE, BLOOD: LIPASE: 256 U/L — AB (ref 11–51)

## 2015-02-14 LAB — CK: Total CK: 702 U/L — ABNORMAL HIGH (ref 49–397)

## 2015-02-14 MED ORDER — MORPHINE SULFATE (PF) 4 MG/ML IV SOLN
4.0000 mg | Freq: Once | INTRAVENOUS | Status: AC
  Administered 2015-02-14: 4 mg via INTRAVENOUS
  Filled 2015-02-14: qty 1

## 2015-02-14 MED ORDER — ACETAMINOPHEN 650 MG RE SUPP: 650.0000 mg | Freq: Four times a day (QID) | RECTAL | Status: DC | PRN

## 2015-02-14 MED ORDER — SODIUM CHLORIDE 0.9 % IV BOLUS (SEPSIS)
1000.0000 mL | Freq: Once | INTRAVENOUS | Status: AC
  Administered 2015-02-14: 1000 mL via INTRAVENOUS

## 2015-02-14 MED ORDER — INSULIN DETEMIR 100 UNIT/ML ~~LOC~~ SOLN
10.0000 [IU] | Freq: Every day | SUBCUTANEOUS | Status: DC
  Administered 2015-02-15: 10 [IU] via SUBCUTANEOUS
  Filled 2015-02-14 (×3): qty 0.1

## 2015-02-14 MED ORDER — ONDANSETRON HCL 4 MG/2ML IJ SOLN
4.0000 mg | Freq: Once | INTRAMUSCULAR | Status: AC
  Administered 2015-02-14: 4 mg via INTRAVENOUS
  Filled 2015-02-14: qty 2

## 2015-02-14 MED ORDER — HEPARIN SODIUM (PORCINE) 5000 UNIT/ML IJ SOLN
5000.0000 [IU] | Freq: Three times a day (TID) | INTRAMUSCULAR | Status: DC
  Administered 2015-02-14 – 2015-02-16 (×5): 5000 [IU] via SUBCUTANEOUS
  Filled 2015-02-14 (×4): qty 1

## 2015-02-14 MED ORDER — INSULIN ASPART 100 UNIT/ML ~~LOC~~ SOLN: 0.0000 [IU] | Freq: Every day | SUBCUTANEOUS | Status: DC

## 2015-02-14 MED ORDER — INSULIN ASPART 100 UNIT/ML ~~LOC~~ SOLN: 0.0000 [IU] | Freq: Three times a day (TID) | SUBCUTANEOUS | Status: DC

## 2015-02-14 MED ORDER — HYDROMORPHONE HCL 1 MG/ML IJ SOLN
1.0000 mg | INTRAMUSCULAR | Status: DC | PRN
  Administered 2015-02-14 – 2015-02-15 (×3): 1 mg via INTRAVENOUS
  Filled 2015-02-14 (×3): qty 1

## 2015-02-14 MED ORDER — SODIUM CHLORIDE 0.9 % IV SOLN
INTRAVENOUS | Status: DC
  Administered 2015-02-14 – 2015-02-16 (×5): via INTRAVENOUS

## 2015-02-14 MED ORDER — SODIUM CHLORIDE 0.9 % IV SOLN
Freq: Once | INTRAVENOUS | Status: DC
  Administered 2015-02-14: 19:00:00 via INTRAVENOUS

## 2015-02-14 MED ORDER — ONDANSETRON HCL 4 MG PO TABS: 4.0000 mg | ORAL_TABLET | Freq: Four times a day (QID) | ORAL | Status: DC | PRN

## 2015-02-14 MED ORDER — ONDANSETRON HCL 4 MG/2ML IJ SOLN
4.0000 mg | Freq: Four times a day (QID) | INTRAMUSCULAR | Status: DC | PRN
  Administered 2015-02-14 – 2015-02-15 (×2): 4 mg via INTRAVENOUS
  Filled 2015-02-14 (×2): qty 2

## 2015-02-14 MED ORDER — ACETAMINOPHEN 325 MG PO TABS
650.0000 mg | ORAL_TABLET | Freq: Four times a day (QID) | ORAL | Status: DC | PRN
Start: 2015-02-14 — End: 2015-02-16

## 2015-02-14 NOTE — ED Notes (Signed)
Notified by 6E that room was not clean and bed control had been notified.

## 2015-02-14 NOTE — H&P (Signed)
[facility] History and Physical  Jonathan Mcneil [id number] DOB: 1955-09-16 DOA: 02/14/2015  Referring physician: Marc Morgans PCP: Merrilee Seashore, MD   Chief Complaint: abdominal pain/low back pain  HPI: Jonathan Mcneil is a very pleasant 59 y.o. male with a past medical history that includes hypertension, diabetes, schizophrenia presents to the emergency department with the chief complaint of low back pain and abdominal pain. Initial evaluation reveals acute on chronic renal failure, hyponatremia, pancreatitis.  Patient reports developing low back pain 2 or 3 days ago. Describes the pain as in his lumbar spine constant radiating down the front of both legs in a "muscle cramp" type pain. He reports some numbness and tingling but no worse than his usual. Also developed abdominal pain described as sharp located in the lower  quadrant radiating to the upper left quadrant. Associated symptoms include chills and some nausea without emesis and 2 episodes of loose stool as well as headache. He denies chest pain palpitation dizziness syncope or near-syncope. He denies dysuria hematuria. He denies melena or hematochezia. He reports slight decrease in oral intake during this time. He does report approximately 2 weeks ago his insulin Levemir was changed to [place].   Workup in the emergency department includes comprehensive metabolic panel significant for sodium of 130, chloride 98 BUN 38 creatinine 2.2, complete blood count unremarkable, lipase 256, urinalysis unremarkable.  He is afebrile hemodynamically stable and not hypoxic. In the emergency department he is provided with 1 L of normal saline intravenously he is provided with 4 mg of morphine 4 mg of Zofran.  Review of Systems:  10 point review of systems complete and all systems are negative except as indicated in the history of present illness Past Medical History  Diagnosis Date  . Diabetes mellitus without complication  ([place])   . Hypertension   . Schizophrenia ([place])   . Pancreatitis   . Low back pain   . CKD (chronic kidney disease)    History reviewed. No pertinent past surgical history. Social History:  reports that he has quit smoking. He does not have any smokeless tobacco history on file. He reports that he does not drink alcohol or use illicit drugs. He is married lives at home with his wife has done so for the last 20 years. He is currently unemployed on disability. He is independent with ADLs Allergies  Allergen Reactions  . Ibuprofen Other (See Comments)    Pt states he was told not to take it.  Marland Kitchen Penicillins Itching    Has patient had a PCN reaction causing immediate rash, facial/tongue/throat swelling, SOB or lightheadedness with hypotension:  Has patient had a PCN reaction causing severe rash involving mucus membranes or skin necrosis:  Has patient had a PCN reaction that required hospitalization  Has patient had a PCN reaction occurring within the last 10 years:  If all of the above answers are "NO", then may proceed with Cephalosporin use.    History reviewed. No pertinent family history.   Prior to Admission medications   Medication Sig Start Date End Date Taking? Authorizing Provider  Insulin Degludec (TRESIBA FLEXTOUCH) 100 UNIT/ML SOPN Inject 12-66 Units into the skin 2 (two) times daily. 66 units in the morning 12 units at night   Yes Historical Provider, MD  lisinopril-hydrochlorothiazide (PRINZIDE,ZESTORETIC) 20-12.5 MG per tablet Take 1 tablet by mouth daily.   Yes Historical Provider, MD  PRESCRIPTION MEDICATION Take 1 capsule by mouth daily. Study drug for diabetes. Dr. Ashby Dawes office   Yes Historical Provider,  MD  Asenapine Maleate 10 MG SUBL Place 1 tablet (10 mg total) under the tongue at bedtime. Do not eat or drink for 30 minutes before or after taking medication Patient not taking: Reported on 02/14/2015 08/24/13   Norma Fredrickson, MD  insulin detemir (LEVEMIR) 100  UNIT/ML injection Inject 12-62 Units into the skin 2 (two) times daily. Pt takes 62 units in the morning, and 12 units in the evening    Historical Provider, MD   Physical Exam: Filed Vitals:   02/14/15 1400 02/14/15 1419 02/14/15 1500 02/14/15 1600  BP: 127/87 123/81 124/79 116/72  Pulse: 79 77 71 67  Temp:      TempSrc:      Resp:  16  16  Height:      Weight:      SpO2: 100% 100% 100% 100%    Wt Readings from Last 3 Encounters:  02/14/15 99.791 kg (220 lb)  02/12/12 101.606 kg (224 lb)    General:  Appears calm and comfortable, obese Eyes: PERRL, normal lids, irises & conjunctiva ENT: grossly normal hearing, lips & tongue, because membranes of his mouth are pink slightly dry Neck: no LAD, masses or thyromegaly Cardiovascular: RRR, no m/r/g. No LE edema. Pedal pulses present and palpable Respiratory: CTA bilaterally, no w/r/r. Normal respiratory effort. Abdomen: soft, ntnd verry sluggish bowel sounds no guarding or rebounding Skin: no rash or induration seen on limited exam Musculoskeletal: grossly normal tone BUE/BLE joints without swelling/erythema, lower extremity strength 5 out of 5 Psychiatric: grossly normal mood and affect, speech fluent and appropriate Neurologic: grossly non-focal. speech clear facial symmetry           Labs on Admission:  Basic Metabolic Panel:  Recent Labs Lab 02/14/15 1254  NA 130*  K 4.2  CL 98*  CO2 22  GLUCOSE 149*  BUN 38*  CREATININE 2.22*  CALCIUM 9.0   Liver Function Tests:  Recent Labs Lab 02/14/15 1254  AST 31  ALT 35  ALKPHOS 78  BILITOT 0.5  PROT 7.7  ALBUMIN 4.0    Recent Labs Lab 02/14/15 1254  LIPASE 256*   No results for input(s): AMMONIA in the last 168 hours. CBC:  Recent Labs Lab 02/14/15 1254  WBC 6.4  HGB 13.5  HCT 41.5  MCV 87.6  PLT 265   Cardiac Enzymes:  Recent Labs Lab 02/14/15 1254  CKTOTAL 702*    BNP (last 3 results) No results for input(s): BNP in the last 8760  hours.  ProBNP (last 3 results) No results for input(s): PROBNP in the last 8760 hours.  CBG: No results for input(s): GLUCAP in the last 168 hours.  Radiological Exams on Admission: No results found.  EKG:   Assessment/Plan Principal Problem:   Pancreatitis Active Problems:   Schizo-affective psychosis ([place])   CKD (chronic kidney disease)   Acute kidney injury superimposed on chronic kidney disease ([facility])   Hyponatremia   Diabetes mellitus without complication ([facility])   Back pain   Neuropathy ([facility])   Obesity   Abdominal pain  1. Pancreatitis. Denies EtOH. Patient still has his gallbladder. Elevated lipase. Will admit to MedSurg. Will provide vigorous IV fluids and provide bowel rest. Will obtain abdominal ultrasound rule out cholelithiasis/obstruction. Check a lipid panel. Of note pancreatitis rare side effect of Treshiba. Supportive therapy in the form of analgesia and anti-emetic 2. Acute on chronic kidney disease stage III. Chart review indicates baseline creatinine range 1.4-1.7. Currently his 2.2. May be an indication of worsening chronic  disease but patient is slightly dehydrated. Will hold any nephrotoxins. Will monitor urine output. Will recheck in the morning 3. Back pain/neuropathy. 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 Physical exam benign. Will obtain an MRI for completeness. 4. Abdominal pain. Likely related to #1. Analgesia as noted 5. Hyponatremia. Likely related to decreased oral intake secondary to #1. IV fluids as noted above. Will recheck in the morning 6. Diabetes. Medication changed 2 weeks ago as noted above. On admission serum glucose 149. Will resume former Levemir. Will use sliding scale insulin for optimal control. Will obtain a hemoglobin A1c. 7. Schizo-effective psychosis. Stable at baseline. Holding asenapine maleate  for now do to #1. 8. Hypertension. Controlled. Will hold lisinopril monitor closely   Code Status:  full DVT Prophylaxis: Family Communication: none present Disposition Plan: home hopefully 24-48 hours  Time spent: 50 minutes  [facility]

## 2015-02-14 NOTE — ED Notes (Addendum)
Pt states c/o abdominal pain and back pain since last night. Pt states started new medication Treshiba in place of his Levimir on 11/10. Pt also states diarrhea x 2-3 days with nausea

## 2015-02-14 NOTE — ED Provider Notes (Signed)
CSN: [id number]     Arrival date & time 02/14/15  1232 History   First MD Initiated Contact with Patient 02/14/15 1358     Chief Complaint  Patient presents with  . Abdominal Pain  . Back Pain     (Consider location/radiation/quality/duration/timing/severity/associated sxs/prior Treatment) HPI Anish Laessig Kon is a 59 y.o. male history of hypertension, diabetes, schizophrenia, presents to emergency department complaining of back pain and abdominal pain. Patient states his primary care doctor started him on new medication approximately 2 weeks ago, states he is taking [place] in place of levimir. States last night developed lower back pain, abdominal pain. States lower back pain radiates into both legs, her reports tingling in his feet. Denies any prior back issues. Denies any injuries. Patient also states he is having pain in the upper abdomen with associated nausea. States this also started yesterday. States no appetite and has not eaten or drank anything today. He denies alcohol use. No history of pancreatitis. No history of abdominal surgeries. No tx prior to coming in.   Patient denies any fever or chills. He denies any trouble controlling his bladder or bowel function. No weakness in extremities.  Past Medical History  Diagnosis Date  . Diabetes mellitus without complication ([place])   . Hypertension   . Schizophrenia ([place])    History reviewed. No pertinent past surgical history. History reviewed. No pertinent family history. Social History  Substance Use Topics  . Smoking status: Former Research scientist (life sciences)  . Smokeless tobacco: None  . Alcohol Use: No    Review of Systems  Constitutional: Negative for fever and chills.  Respiratory: Negative for cough, chest tightness and shortness of breath.   Cardiovascular: Negative for chest pain, palpitations and leg swelling.  Gastrointestinal: Positive for abdominal pain. Negative for nausea, vomiting, diarrhea and abdominal distention.   Genitourinary: Negative for dysuria, urgency, frequency and hematuria.  Musculoskeletal: Positive for back pain. Negative for myalgias, arthralgias, neck pain and neck stiffness.  Skin: Negative for rash.  Allergic/Immunologic: Negative for immunocompromised state.  Neurological: Positive for numbness. Negative for dizziness, weakness, light-headedness and headaches.  All other systems reviewed and are negative.     Allergies  Ibuprofen and Penicillins  Home Medications   Prior to Admission medications   Medication Sig Start Date End Date Taking? Authorizing Provider  Insulin Degludec (TRESIBA FLEXTOUCH) 100 UNIT/ML SOPN Inject 12-66 Units into the skin 2 (two) times daily. 66 units in the morning 12 units at night   Yes Historical Provider, MD  lisinopril-hydrochlorothiazide (PRINZIDE,ZESTORETIC) 20-12.5 MG per tablet Take 1 tablet by mouth daily.   Yes Historical Provider, MD  PRESCRIPTION MEDICATION Take 1 capsule by mouth daily. Study drug for diabetes. Dr. Ashby Dawes office   Yes Historical Provider, MD  Asenapine Maleate 10 MG SUBL Place 1 tablet (10 mg total) under the tongue at bedtime. Do not eat or drink for 30 minutes before or after taking medication Patient not taking: Reported on 02/14/2015 08/24/13   Norma Fredrickson, MD  insulin detemir (LEVEMIR) 100 UNIT/ML injection Inject 12-62 Units into the skin 2 (two) times daily. Pt takes 62 units in the morning, and 12 units in the evening    Historical Provider, MD   BP 123/81 mmHg  Pulse 77  Temp(Src) 98.5 F (36.9 C) (Oral)  Resp 16  Ht 5\' 9"  (1.753 m)  Wt 99.791 kg  BMI 32.47 kg/m2  SpO2 100% Physical Exam  Constitutional: He is oriented to person, place, and time. He appears well-developed and well-nourished.  No distress.  HENT:  Head: Normocephalic and atraumatic.  Eyes: Conjunctivae are normal.  Neck: Neck supple.  Cardiovascular: Normal rate, regular rhythm and normal heart sounds.   Pulmonary/Chest: Effort  normal. No respiratory distress. He has no wheezes. He has no rales.  Abdominal: Soft. Bowel sounds are normal. He exhibits no distension. There is tenderness. There is no rebound.  LUQ and epigastric tenderness  Musculoskeletal: He exhibits no edema.  No midline lumbar spine tenderness  Neurological: He is alert and oriented to person, place, and time.  5/5 and equal lower extremity strength. 2+ and equal patellar reflexes bilaterally. Pt able to dorsiflex bilateral toes and feet with good strength against resistance. Equal sensation bilaterally over thighs and lower legs.   Skin: Skin is warm and dry.  Nursing note and vitals reviewed.   ED Course  Procedures (including critical care time) Labs Review Labs Reviewed  LIPASE, BLOOD - Abnormal; Notable for the following:    Lipase 256 (*)    All other components within normal limits  COMPREHENSIVE METABOLIC PANEL - Abnormal; Notable for the following:    Sodium 130 (*)    Chloride 98 (*)    Glucose, Bld 149 (*)    BUN 38 (*)    Creatinine, Ser 2.22 (*)    GFR calc non Af Amer 31 (*)    GFR calc Af Amer 36 (*)    All other components within normal limits  URINALYSIS, ROUTINE W REFLEX MICROSCOPIC (NOT AT [facility]) - Abnormal; Notable for the following:    APPearance CLOUDY (*)    Glucose, UA 250 (*)    Hgb urine dipstick TRACE (*)    All other components within normal limits  URINE MICROSCOPIC-ADD ON - Abnormal; Notable for the following:    Squamous Epithelial / LPF 0-5 (*)    Bacteria, UA RARE (*)    All other components within normal limits  CBC    Imaging Review No results found. I have personally reviewed and evaluated these images and lab results as part of my medical decision-making.   EKG Interpretation None      MDM   Final diagnoses:  Back pain  Acute pancreatitis, unspecified pancreatitis type  Renal insufficiency    patient with several complaints. Patient is complaining of lower back pain that radiates  down bilateral legs. Reports associated numbness and tingling in his feet. His exam is unremarkable. Given neurological complaints, will get MRI of lumbar spine to rule out cord compression. Patient is also complaining of abdominal pain with nausea. Recently started on new medications, patient thinks that these are side effects of this new medicine. Will get labs, urinalysis.  3:42 PM Lipase elevated at 256. Urinalysis with no evidence of infection. Creatinine worsened to 2.22, BUN 38. We'll start hydration, morphine ordered for pain. MRI pending.  4:10 PM Spoke with [facility]. Will admit  Filed Vitals:   02/14/15 1400 02/14/15 1419 02/14/15 1500 02/14/15 1600  BP: 127/87 123/81 124/79 116/72  Pulse: 79 77 71 67  Temp:      TempSrc:      Resp:  16    Height:      Weight:      SpO2: 100% 100% 100% 100%     Jeannett Senior, PA-C 02/14/15 [place], DO 02/15/15 [id number]

## 2015-02-14 NOTE — ED Notes (Signed)
Attempted report x1. 

## 2015-02-14 NOTE — ED Notes (Signed)
Patient transported to MRI 

## 2015-02-15 DIAGNOSIS — E871 Hypo-osmolality and hyponatremia: Secondary | ICD-10-CM | POA: Diagnosis present

## 2015-02-15 DIAGNOSIS — E669 Obesity, unspecified: Secondary | ICD-10-CM | POA: Diagnosis present

## 2015-02-15 DIAGNOSIS — K859 Acute pancreatitis without necrosis or infection, unspecified: Secondary | ICD-10-CM | POA: Diagnosis present

## 2015-02-15 DIAGNOSIS — I129 Hypertensive chronic kidney disease with stage 1 through stage 4 chronic kidney disease, or unspecified chronic kidney disease: Secondary | ICD-10-CM | POA: Diagnosis present

## 2015-02-15 DIAGNOSIS — E860 Dehydration: Secondary | ICD-10-CM | POA: Diagnosis present

## 2015-02-15 DIAGNOSIS — N183 Chronic kidney disease, stage 3 (moderate): Secondary | ICD-10-CM | POA: Diagnosis present

## 2015-02-15 DIAGNOSIS — Z794 Long term (current) use of insulin: Secondary | ICD-10-CM | POA: Diagnosis not present

## 2015-02-15 DIAGNOSIS — Z87891 Personal history of nicotine dependence: Secondary | ICD-10-CM | POA: Diagnosis not present

## 2015-02-15 DIAGNOSIS — E1122 Type 2 diabetes mellitus with diabetic chronic kidney disease: Secondary | ICD-10-CM | POA: Diagnosis present

## 2015-02-15 DIAGNOSIS — E1140 Type 2 diabetes mellitus with diabetic neuropathy, unspecified: Secondary | ICD-10-CM | POA: Diagnosis present

## 2015-02-15 DIAGNOSIS — G9340 Encephalopathy, unspecified: Secondary | ICD-10-CM | POA: Diagnosis present

## 2015-02-15 DIAGNOSIS — R1010 Upper abdominal pain, unspecified: Secondary | ICD-10-CM | POA: Diagnosis not present

## 2015-02-15 DIAGNOSIS — N179 Acute kidney failure, unspecified: Secondary | ICD-10-CM | POA: Diagnosis present

## 2015-02-15 DIAGNOSIS — F259 Schizoaffective disorder, unspecified: Secondary | ICD-10-CM | POA: Diagnosis present

## 2015-02-15 DIAGNOSIS — Z6832 Body mass index (BMI) 32.0-32.9, adult: Secondary | ICD-10-CM | POA: Diagnosis not present

## 2015-02-15 DIAGNOSIS — R109 Unspecified abdominal pain: Secondary | ICD-10-CM | POA: Diagnosis present

## 2015-02-15 DIAGNOSIS — Z79899 Other long term (current) drug therapy: Secondary | ICD-10-CM | POA: Diagnosis not present

## 2015-02-15 LAB — COMPREHENSIVE METABOLIC PANEL
ALT: 27 U/L (ref 17–63)
ANION GAP: 9 (ref 5–15)
AST: 24 U/L (ref 15–41)
Albumin: 3.4 g/dL — ABNORMAL LOW (ref 3.5–5.0)
Alkaline Phosphatase: 61 U/L (ref 38–126)
BUN: 32 mg/dL — ABNORMAL HIGH (ref 6–20)
CALCIUM: 8.5 mg/dL — AB (ref 8.9–10.3)
CHLORIDE: 105 mmol/L (ref 101–111)
CO2: 20 mmol/L — AB (ref 22–32)
Creatinine, Ser: 1.82 mg/dL — ABNORMAL HIGH (ref 0.61–1.24)
GFR, EST AFRICAN AMERICAN: 46 mL/min — AB (ref >60)
GFR, EST NON AFRICAN AMERICAN: 39 mL/min — AB (ref >60)
Glucose, Bld: 99 mg/dL (ref 65–99)
Potassium: 4.7 mmol/L (ref 3.5–5.1)
SODIUM: 134 mmol/L — AB (ref 135–145)
Total Bilirubin: 0.8 mg/dL (ref 0.3–1.2)
Total Protein: 6.9 g/dL (ref 6.5–8.1)

## 2015-02-15 LAB — CBC
HCT: 39.4 % (ref 39.0–52.0)
HEMOGLOBIN: 12.9 g/dL — AB (ref 13.0–17.0)
MCH: 28.7 pg (ref 26.0–34.0)
MCHC: 32.7 g/dL (ref 30.0–36.0)
MCV: 87.8 fL (ref 78.0–100.0)
PLATELETS: 243 10*3/uL (ref 150–400)
RBC: 4.49 MIL/uL (ref 4.22–5.81)
RDW: 13.9 % (ref 11.5–15.5)
WBC: 6.2 10*3/uL (ref 4.0–10.5)

## 2015-02-15 LAB — GLUCOSE, CAPILLARY
GLUCOSE-CAPILLARY: 100 mg/dL — AB (ref 65–99)
GLUCOSE-CAPILLARY: 110 mg/dL — AB (ref 65–99)
GLUCOSE-CAPILLARY: 123 mg/dL — AB (ref 65–99)
Glucose-Capillary: 84 mg/dL (ref 65–99)

## 2015-02-15 LAB — LIPASE, BLOOD: LIPASE: 27 U/L (ref 11–51)

## 2015-02-15 MED ORDER — CETYLPYRIDINIUM CHLORIDE 0.05 % MT LIQD
7.0000 mL | Freq: Two times a day (BID) | OROMUCOSAL | Status: DC
  Administered 2015-02-15 (×2): 7 mL via OROMUCOSAL

## 2015-02-15 MED ORDER — CHLORHEXIDINE GLUCONATE 0.12 % MT SOLN
15.0000 mL | Freq: Two times a day (BID) | OROMUCOSAL | Status: DC
  Administered 2015-02-15 – 2015-02-16 (×4): 15 mL via OROMUCOSAL
  Filled 2015-02-15 (×3): qty 15

## 2015-02-15 NOTE — Progress Notes (Signed)
[facility] PROGRESS NOTE  Jonathan Mcneil [id number] DOB: [birth year] DOA: 02/14/2015 PCP: Merrilee Seashore, MD  Length of stay:    Assessment/Plan: Principal Problem:   Pancreatitis Active Problems:   Schizo-affective psychosis ([place])   CKD (chronic kidney disease)   Acute kidney injury superimposed on chronic kidney disease ([place])   Hyponatremia   Diabetes mellitus without complication ([facility])   Back pain   Neuropathy ([facility])   Obesity   Abdominal pain    1. Acute Pancreatitis. Denies EtOH. Patient still has his gallbladder. Elevated lipase. Continue to monitor on MedSurg. Continue IV fluids and provide bowel rest. Right upper quadrant ultrasound does not show cholelithiasis/obstruction. Triglycerides 260. Of note pancreatitis rare side effect of Treshiba. Supportive therapy in the form of analgesia and anti-emetic 2. Acute on chronic kidney disease stage III. Chart review indicates baseline creatinine range 1.4-1.7. Currently his 1.82 improved from 2.22 yesterday. May be an indication of worsening chronic disease but patient is slightly dehydrated. Will hold any nephrotoxins. 3. Back pain/neuropathy. 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 Physical exam benign. MRI of the lumbar spine shows degenerative changes L2-L3, L5-S1 without any high-grade spinal stenosis or foraminal narrowing. 4. Abdominal pain. Likely related to #1. Analgesia as noted 5. Hyponatremia. Likely related to decreased oral intake secondary to #1. Improving with IV fluids as noted above. Will recheck in the morning 6. Diabetes. Medication changed 2 weeks ago as noted above. On admission serum glucose 149. Will resume former Levemir. Will use sliding scale insulin for optimal control. Hemoglobin A1c pending 7. Schizo-effective psychosis. Stable at baseline. Holding asenapine maleate for now do to #1. 8. Hypertension. Controlled. Will hold lisinopril monitor  closely 9.  DVT prophylaxsis heparin  Code Status:      Code Status Orders        Start     Ordered   02/14/15 1836  Full code   Continuous     02/14/15 1836     Family Communication: family updated about patient's clinical progress Disposition Plan:  As above    Brief narrative: Jonathan Mcneil is a very pleasant 59 y.o. male with a past medical history that includes hypertension, diabetes, schizophrenia presents to the emergency department with the chief complaint of low back pain and abdominal pain. Initial evaluation reveals acute on chronic renal failure, hyponatremia, pancreatitis.  Patient reports developing low back pain 2 or 3 days ago. Describes the pain as in his lumbar spine constant radiating down the front of both legs in a "muscle cramp" type pain. He reports some numbness and tingling but no worse than his usual. Also developed abdominal pain described as sharp located in the lower quadrant radiating to the upper left quadrant. Associated symptoms include chills and some nausea without emesis and 2 episodes of loose stool as well as headache. He denies chest pain palpitation dizziness syncope or near-syncope. He denies dysuria hematuria. He denies melena or hematochezia. He reports slight decrease in oral intake during this time. He does report approximately 2 weeks ago his insulin Levemir was changed to [place].   Workup in the emergency department includes comprehensive metabolic panel significant for sodium of 130, chloride 98 BUN 38 creatinine 2.2, complete blood count unremarkable, lipase 256, urinalysis unremarkable.  He is afebrile hemodynamically stable and not hypoxic. In the emergency department he is provided with 1 L of normal saline intravenously he is provided with 4 mg of morphine 4 mg of Zofran.*  Consultants:  None   Procedures:  None   Antibiotics: Anti-infectives    None         HPI/Subjective: Still having some nausea , would  like to try clears   Objective: Filed Vitals:   02/15/15 0125 02/15/15 0500 02/15/15 0537 02/15/15 0900  BP: 101/73  104/78 108/76  Pulse: 68  69 68  Temp: 97.8 F (36.6 C)  97.8 F (36.6 C) 97.6 F (36.4 C)  TempSrc: Oral  Oral Oral  Resp: 18  18 18   Height:      Weight:  104.2 kg (229 lb 11.5 oz)    SpO2: 100%  100% 100%    Intake/Output Summary (Last 24 hours) at 02/15/15 1318 Last data filed at 02/15/15 1034  Gross per 24 hour  Intake 1484.58 ml  Output   2350 ml  Net -865.42 ml    Exam:  Gen: Alert and oriented 3, NAD HEENT: neck supple, no JVD CVS: S1 and S2 clear, regular rate and rhythm Chest: clear to auscultation bilaterally, no wheezing rales or rhonchi Abdomen: Soft, mild epigastric tenderness, nondistended, normal bowel sounds Extremities: No cyanosis clubbing or edema noted Neuro: Cranial nerves II-12 intact, strength 5/5 in upper and lower extremeties bilaterally. Back : Tender in the lumbar region  Skin: No rashes Psych: Alert and oriented 3, normal affect     Data Review   Micro Results No results found for this or any previous visit (from the past 240 hour(s)).  Radiology Reports Mr Lumbar Spine Wo Contrast  02/14/2015  CLINICAL DATA:  59 year old male with lower back and abdominal pain. Back pain radiates into both legs with tingling in feet. No injury. Hypertension, diabetes, schizophrenia and chronic kidney disease. Initial encounter. EXAM: MRI LUMBAR SPINE WITHOUT CONTRAST TECHNIQUE: Multiplanar, multisequence MR imaging of the lumbar spine was performed. No intravenous contrast was administered. COMPARISON:  No comparison MR. Comparison CT abdomen pelvis 01/23/2009. FINDINGS: Last fully open disk space is labeled L5-S1. Present examination incorporates from T11-12 disc space through the S3 level. Conus lower L1 level. The urinary bladder is markedly enlarged with superior extension. No compression of the distal cord/conus detected as cause  of this finding. T11-12: Negative. T12-L1:  Negative. L1-2:  Negative. L2-3: Bulge slightly greater to the right without compression of the exiting right L2 nerve root. Short pedicles. No significant spinal stenosis. L3-4: Moderate facet degenerative changes. Minimal anterior slip L3. Disc degeneration with anterior osteophyte. Bulge. Slightly short pedicles. Minimal narrowing ventral thecal sac without significant spinal stenosis. L4-5: Bulge/ shallow central protrusion with slight caudal extension with mild indentation ventral thecal sac slightly greater to left. No high-grade stenosis. Mild facet joint degenerative changes. L5-S1: Facet degenerative changes. Minimal bulge. No significant spinal stenosis or foraminal narrowing. IMPRESSION: Mild degenerative changes L2-3 through L5-S1 with findings most prominent at the L4-5 level but without high-grade spinal stenosis or foraminal narrowing. Please see above for further detail. Urinary bladder is enlarged without distal cord/ conus compression detected as cause of this finding. Electronically Signed   By: Genia Del M.D.   On: 02/14/2015 17:28   US Abdomen Complete  02/14/2015  CLINICAL DATA:  Abdominal pain for 1 day.  Diabetes.  Hypertension. EXAM: ULTRASOUND ABDOMEN COMPLETE COMPARISON:  CT abdomen and pelvis 01/23/2009 FINDINGS: Gallbladder: No gallstones or wall thickening visualized. No sonographic Murphy sign noted. Common bile duct: Diameter: 2.2 mm, normal Liver: Diffusely increased parenchymal echotexture suggesting fatty infiltration. No focal lesions identified. IVC: No abnormality visualized. Pancreas: Visualized portions appear  unremarkable. Spleen: Size and appearance within normal limits. Right Kidney: Length: 11.5 cm. Mild parenchymal thinning. Sub cm cyst in the upper pole. No hydronephrosis. Left Kidney: Length: 12.2 cm. Echogenicity within normal limits. No mass or hydronephrosis visualized. Abdominal aorta: No aneurysm visualized. Other  findings: None. IMPRESSION: No acute abnormalities demonstrated. Probable fatty infiltration of the liver. Electronically Signed   By: Lucienne Capers M.D.   On: 02/14/2015 21:32     CBC  Recent Labs Lab 02/14/15 1254 02/15/15 0255  WBC 6.4 6.2  HGB 13.5 12.9*  HCT 41.5 39.4  PLT 265 243  MCV 87.6 87.8  MCH 28.5 28.7  MCHC 32.5 32.7  RDW 13.7 13.9    Chemistries   Recent Labs Lab 02/14/15 1254 02/15/15 0255  NA 130* 134*  K 4.2 4.7  CL 98* 105  CO2 22 20*  GLUCOSE 149* 99  BUN 38* 32*  CREATININE 2.22* 1.82*  CALCIUM 9.0 8.5*  AST 31 24  ALT 35 27  ALKPHOS 78 61  BILITOT 0.5 0.8   ------------------------------------------------------------------------------------------------------------------ estimated creatinine clearance is 52.6 mL/min (by C-G formula based on Cr of 1.82). ------------------------------------------------------------------------------------------------------------------ No results for input(s): HGBA1C in the last 72 hours. ------------------------------------------------------------------------------------------------------------------  Recent Labs  02/14/15 1938  CHOL 207*  HDL 32*  LDLCALC 123*  TRIG 262*  CHOLHDL 6.5   ------------------------------------------------------------------------------------------------------------------ No results for input(s): TSH, T4TOTAL, T3FREE, THYROIDAB in the last 72 hours.  Invalid input(s): FREET3 ------------------------------------------------------------------------------------------------------------------ No results for input(s): VITAMINB12, FOLATE, FERRITIN, TIBC, IRON, RETICCTPCT in the last 72 hours.  Coagulation profile No results for input(s): INR, PROTIME in the last 168 hours.  No results for input(s): DDIMER in the last 72 hours.  Cardiac Enzymes No results for input(s): CKMB, TROPONINI, MYOGLOBIN in the last 168 hours.  Invalid input(s):  CK ------------------------------------------------------------------------------------------------------------------ Invalid input(s): POCBNP   CBG:  Recent Labs Lab 02/14/15 2057 02/15/15 0756 02/15/15 1149  GLUCAP 86 84 100*       Studies: Mr Lumbar Spine Wo Contrast  02/14/2015  CLINICAL DATA:  59 year old male with lower back and abdominal pain. Back pain radiates into both legs with tingling in feet. No injury. Hypertension, diabetes, schizophrenia and chronic kidney disease. Initial encounter. EXAM: MRI LUMBAR SPINE WITHOUT CONTRAST TECHNIQUE: Multiplanar, multisequence MR imaging of the lumbar spine was performed. No intravenous contrast was administered. COMPARISON:  No comparison MR. Comparison CT abdomen pelvis 01/23/2009. FINDINGS: Last fully open disk space is labeled L5-S1. Present examination incorporates from T11-12 disc space through the S3 level. Conus lower L1 level. The urinary bladder is markedly enlarged with superior extension. No compression of the distal cord/conus detected as cause of this finding. T11-12: Negative. T12-L1:  Negative. L1-2:  Negative. L2-3: Bulge slightly greater to the right without compression of the exiting right L2 nerve root. Short pedicles. No significant spinal stenosis. L3-4: Moderate facet degenerative changes. Minimal anterior slip L3. Disc degeneration with anterior osteophyte. Bulge. Slightly short pedicles. Minimal narrowing ventral thecal sac without significant spinal stenosis. L4-5: Bulge/ shallow central protrusion with slight caudal extension with mild indentation ventral thecal sac slightly greater to left. No high-grade stenosis. Mild facet joint degenerative changes. L5-S1: Facet degenerative changes. Minimal bulge. No significant spinal stenosis or foraminal narrowing. IMPRESSION: Mild degenerative changes L2-3 through L5-S1 with findings most prominent at the L4-5 level but without high-grade spinal stenosis or foraminal  narrowing. Please see above for further detail. Urinary bladder is enlarged without distal cord/ conus compression detected as cause of this finding. Electronically Signed  By: Genia Del M.D.   On: 02/14/2015 17:28   US Abdomen Complete  02/14/2015  CLINICAL DATA:  Abdominal pain for 1 day.  Diabetes.  Hypertension. EXAM: ULTRASOUND ABDOMEN COMPLETE COMPARISON:  CT abdomen and pelvis 01/23/2009 FINDINGS: Gallbladder: No gallstones or wall thickening visualized. No sonographic Murphy sign noted. Common bile duct: Diameter: 2.2 mm, normal Liver: Diffusely increased parenchymal echotexture suggesting fatty infiltration. No focal lesions identified. IVC: No abnormality visualized. Pancreas: Visualized portions appear unremarkable. Spleen: Size and appearance within normal limits. Right Kidney: Length: 11.5 cm. Mild parenchymal thinning. Sub cm cyst in the upper pole. No hydronephrosis. Left Kidney: Length: 12.2 cm. Echogenicity within normal limits. No mass or hydronephrosis visualized. Abdominal aorta: No aneurysm visualized. Other findings: None. IMPRESSION: No acute abnormalities demonstrated. Probable fatty infiltration of the liver. Electronically Signed   By: Lucienne Capers M.D.   On: 02/14/2015 21:32      Lab Results  Component Value Date   HGBA1C * 04/29/2008    13.8 (NOTE)   The ADA recommends the following therapeutic goal for glycemic   control related to Hgb A1C measurement:   Goal of Therapy:   < 7.0% Hgb A1C   Reference: American Diabetes Association: Clinical Practice   Recommendations 2008, Diabetes Care,  2008, 31:(Suppl 1).   Lab Results  Component Value Date   MICROALBUR 10.60* 03/31/2007   LDLCALC 123* 02/14/2015   CREATININE 1.82* 02/15/2015       Scheduled Meds: . antiseptic oral rinse  7 mL Mouth Rinse q12n4p  . chlorhexidine  15 mL Mouth Rinse BID  . heparin  5,000 Units Subcutaneous 3 times per day  . insulin aspart  0-15 Units Subcutaneous TID WC  . insulin  aspart  0-5 Units Subcutaneous QHS  . insulin detemir  10 Units Subcutaneous QHS   Continuous Infusions: . sodium chloride 125 mL/hr at 02/15/15 1033    Principal Problem:   Pancreatitis Active Problems:   Schizo-affective psychosis ([place])   CKD (chronic kidney disease)   Acute kidney injury superimposed on chronic kidney disease ([facility])   Hyponatremia   Diabetes mellitus without complication ([facility])   Back pain   Neuropathy ([facility])   Obesity   Abdominal pain    Time spent: 45 minutes   [facility] Pager [phone]. If 7PM-7AM, please contact night-coverage at [email or web address], password [facility] 02/15/2015, 1:18 PM

## 2015-02-16 LAB — COMPREHENSIVE METABOLIC PANEL
ALBUMIN: 3.6 g/dL (ref 3.5–5.0)
ALT: 25 U/L (ref 17–63)
ANION GAP: 9 (ref 5–15)
AST: 23 U/L (ref 15–41)
Alkaline Phosphatase: 71 U/L (ref 38–126)
BILIRUBIN TOTAL: 0.8 mg/dL (ref 0.3–1.2)
BUN: 25 mg/dL — ABNORMAL HIGH (ref 6–20)
CHLORIDE: 106 mmol/L (ref 101–111)
CO2: 22 mmol/L (ref 22–32)
Calcium: 9.1 mg/dL (ref 8.9–10.3)
Creatinine, Ser: 1.77 mg/dL — ABNORMAL HIGH (ref 0.61–1.24)
GFR calc Af Amer: 47 mL/min — ABNORMAL LOW (ref >60)
GFR calc non Af Amer: 41 mL/min — ABNORMAL LOW (ref >60)
GLUCOSE: 88 mg/dL (ref 65–99)
POTASSIUM: 4.6 mmol/L (ref 3.5–5.1)
SODIUM: 137 mmol/L (ref 135–145)
TOTAL PROTEIN: 7.2 g/dL (ref 6.5–8.1)

## 2015-02-16 LAB — CBC
HCT: 40.6 % (ref 39.0–52.0)
Hemoglobin: 13.1 g/dL (ref 13.0–17.0)
MCH: 28.5 pg (ref 26.0–34.0)
MCHC: 32.3 g/dL (ref 30.0–36.0)
MCV: 88.5 fL (ref 78.0–100.0)
PLATELETS: 272 10*3/uL (ref 150–400)
RBC: 4.59 MIL/uL (ref 4.22–5.81)
RDW: 14 % (ref 11.5–15.5)
WBC: 5.4 10*3/uL (ref 4.0–10.5)

## 2015-02-16 LAB — GLUCOSE, CAPILLARY
GLUCOSE-CAPILLARY: 101 mg/dL — AB (ref 65–99)
GLUCOSE-CAPILLARY: 95 mg/dL (ref 65–99)

## 2015-02-16 LAB — HEMOGLOBIN A1C
HEMOGLOBIN A1C: 9.8 % — AB (ref 4.8–5.6)
HEMOGLOBIN A1C: 9.9 % — AB (ref 4.8–5.6)
MEAN PLASMA GLUCOSE: 235 mg/dL
MEAN PLASMA GLUCOSE: 237 mg/dL

## 2015-02-16 MED ORDER — INSULIN DETEMIR 100 UNIT/ML ~~LOC~~ SOLN: 20.0000 [IU] | Freq: Every day | SUBCUTANEOUS | Status: DC

## 2015-02-16 MED ORDER — AMLODIPINE BESYLATE 5 MG PO TABS
5.0000 mg | ORAL_TABLET | Freq: Every day | ORAL | Status: DC
  Administered 2015-02-16: 5 mg via ORAL
  Filled 2015-02-16: qty 1

## 2015-02-16 MED ORDER — AMLODIPINE BESYLATE 5 MG PO TABS: 5.0000 mg | ORAL_TABLET | Freq: Every day | ORAL | Status: DC

## 2015-02-16 NOTE — Discharge Planning (Addendum)
AVS to pt who verbalizes understanding. Dc ambulatory to private car home with all personal belongings at 1410

## 2015-02-16 NOTE — Discharge Summary (Signed)
Physician Discharge Summary  Jonathan Mcneil MRN: [id number] DOB/AGE: Aug 08, 1955 59 y.o.  PCP: Merrilee Seashore, MD   Admit date: 02/14/2015 Discharge date: 02/16/2015  Discharge Diagnoses:     Principal Problem:   Pancreatitis Active Problems:   Schizo-affective psychosis ([place])   CKD (chronic kidney disease)   Acute kidney injury superimposed on chronic kidney disease (HCC)   Hyponatremia   Diabetes mellitus without complication (HCC)   Back pain   Neuropathy (HCC)   Obesity   Abdominal pain   Acute encephalopathy    Follow-up recommendations Follow-up with PCP in 3-5 days , including all  additional recommended appointments as below Follow-up CBC, CMP in 3-5 days Patient can restart his antihypertensive medication lisinopril/HCTZ and discontinue Norvasc next week, if his renal function remained stable Patient was also be titration of his Levemir based on his CBGs     Medication List    STOP taking these medications        lisinopril-hydrochlorothiazide 20-12.5 MG tablet  Commonly known as:  PRINZIDE,ZESTORETIC     TRESIBA FLEXTOUCH 100 UNIT/ML Sopn  Generic drug:  Insulin Degludec      TAKE these medications        amLODipine 5 MG tablet  Commonly known as:  NORVASC  Take 1 tablet (5 mg total) by mouth daily.     Asenapine Maleate 10 MG Subl  Place 1 tablet (10 mg total) under the tongue at bedtime. Do not eat or drink for 30 minutes before or after taking medication     insulin detemir 100 UNIT/ML injection  Commonly known as:  LEVEMIR  Inject 0.2 mLs (20 Units total) into the skin at bedtime.     PRESCRIPTION MEDICATION  Take 1 capsule by mouth daily. Study drug for diabetes. Dr. Ashby Dawes office         Discharge Condition: *Stable   Discharge Instructions         Disposition: 01-Home or Self Care   Consults: None    Significant Diagnostic Studies:  Mr Lumbar Spine Wo Contrast  02/14/2015  CLINICAL DATA:   59 year old male with lower back and abdominal pain. Back pain radiates into both legs with tingling in feet. No injury. Hypertension, diabetes, schizophrenia and chronic kidney disease. Initial encounter. EXAM: MRI LUMBAR SPINE WITHOUT CONTRAST TECHNIQUE: Multiplanar, multisequence MR imaging of the lumbar spine was performed. No intravenous contrast was administered. COMPARISON:  No comparison MR. Comparison CT abdomen pelvis 01/23/2009. FINDINGS: Last fully open disk space is labeled L5-S1. Present examination incorporates from T11-12 disc space through the S3 level. Conus lower L1 level. The urinary bladder is markedly enlarged with superior extension. No compression of the distal cord/conus detected as cause of this finding. T11-12: Negative. T12-L1:  Negative. L1-2:  Negative. L2-3: Bulge slightly greater to the right without compression of the exiting right L2 nerve root. Short pedicles. No significant spinal stenosis. L3-4: Moderate facet degenerative changes. Minimal anterior slip L3. Disc degeneration with anterior osteophyte. Bulge. Slightly short pedicles. Minimal narrowing ventral thecal sac without significant spinal stenosis. L4-5: Bulge/ shallow central protrusion with slight caudal extension with mild indentation ventral thecal sac slightly greater to left. No high-grade stenosis. Mild facet joint degenerative changes. L5-S1: Facet degenerative changes. Minimal bulge. No significant spinal stenosis or foraminal narrowing. IMPRESSION: Mild degenerative changes L2-3 through L5-S1 with findings most prominent at the L4-5 level but without high-grade spinal stenosis or foraminal narrowing. Please see above for further detail. Urinary bladder is enlarged without distal cord/ conus compression detected  as cause of this finding. Electronically Signed   By: Genia Del M.D.   On: 02/14/2015 17:28   US Abdomen Complete  02/14/2015  CLINICAL DATA:  Abdominal pain for 1 day.  Diabetes.  Hypertension.  EXAM: ULTRASOUND ABDOMEN COMPLETE COMPARISON:  CT abdomen and pelvis 01/23/2009 FINDINGS: Gallbladder: No gallstones or wall thickening visualized. No sonographic Murphy sign noted. Common bile duct: Diameter: 2.2 mm, normal Liver: Diffusely increased parenchymal echotexture suggesting fatty infiltration. No focal lesions identified. IVC: No abnormality visualized. Pancreas: Visualized portions appear unremarkable. Spleen: Size and appearance within normal limits. Right Kidney: Length: 11.5 cm. Mild parenchymal thinning. Sub cm cyst in the upper pole. No hydronephrosis. Left Kidney: Length: 12.2 cm. Echogenicity within normal limits. No mass or hydronephrosis visualized. Abdominal aorta: No aneurysm visualized. Other findings: None. IMPRESSION: No acute abnormalities demonstrated. Probable fatty infiltration of the liver. Electronically Signed   By: Lucienne Capers M.D.   On: 02/14/2015 21:32        Filed Weights   02/14/15 1239 02/15/15 0500 02/16/15 0519  Weight: 99.791 kg (220 lb) 104.2 kg (229 lb 11.5 oz) 104.6 kg (230 lb 9.6 oz)     Microbiology: No results found for this or any previous visit (from the past 240 hour(s)).     Blood Culture    Component Value Date/Time   SDES URINE, CLEAN CATCH 10/10/2014 1600   SPECREQUEST NONE 10/10/2014 1600   CULT NO GROWTH 1 DAY 10/10/2014 1600   REPTSTATUS 10/11/2014 FINAL 10/10/2014 1600      Labs: Results for orders placed or performed during the hospital encounter of 02/14/15 (from the past 48 hour(s))  Lipase, blood     Status: Abnormal   Collection Time: 02/14/15 12:54 PM  Result Value Ref Range   Lipase 256 (H) 11 - 51 U/L  Comprehensive metabolic panel     Status: Abnormal   Collection Time: 02/14/15 12:54 PM  Result Value Ref Range   Sodium 130 (L) 135 - 145 mmol/L   Potassium 4.2 3.5 - 5.1 mmol/L   Chloride 98 (L) 101 - 111 mmol/L   CO2 22 22 - 32 mmol/L   Glucose, Bld 149 (H) 65 - 99 mg/dL   BUN 38 (H) 6 - 20 mg/dL    Creatinine, Ser 2.22 (H) 0.61 - 1.24 mg/dL   Calcium 9.0 8.9 - 10.3 mg/dL   Total Protein 7.7 6.5 - 8.1 g/dL   Albumin 4.0 3.5 - 5.0 g/dL   AST 31 15 - 41 U/L   ALT 35 17 - 63 U/L   Alkaline Phosphatase 78 38 - 126 U/L   Total Bilirubin 0.5 0.3 - 1.2 mg/dL   GFR calc non Af Amer 31 (L) >60 mL/min   GFR calc Af Amer 36 (L) >60 mL/min    Comment: (NOTE) The eGFR has been calculated using the CKD EPI equation. This calculation has not been validated in all clinical situations. eGFR's persistently <60 mL/min signify possible Chronic Kidney Disease.    Anion gap 10 5 - 15  CBC     Status: None   Collection Time: 02/14/15 12:54 PM  Result Value Ref Range   WBC 6.4 4.0 - 10.5 K/uL   RBC 4.74 4.22 - 5.81 MIL/uL   Hemoglobin 13.5 13.0 - 17.0 g/dL   HCT 41.5 39.0 - 52.0 %   MCV 87.6 78.0 - 100.0 fL   MCH 28.5 26.0 - 34.0 pg   MCHC 32.5 30.0 - 36.0 g/dL   RDW  13.7 11.5 - 15.5 %   Platelets 265 150 - 400 K/uL  CK     Status: Abnormal   Collection Time: 02/14/15 12:54 PM  Result Value Ref Range   Total CK 702 (H) 49 - 397 U/L  Urinalysis, Routine w reflex microscopic (not at [facility])     Status: Abnormal   Collection Time: 02/14/15  2:18 PM  Result Value Ref Range   Color, Urine YELLOW YELLOW   APPearance CLOUDY (A) CLEAR   Specific Gravity, Urine 1.009 1.005 - 1.030   pH 5.0 5.0 - 8.0   Glucose, UA 250 (A) NEGATIVE mg/dL   Hgb urine dipstick TRACE (A) NEGATIVE   Bilirubin Urine NEGATIVE NEGATIVE   Ketones, ur NEGATIVE NEGATIVE mg/dL   Protein, ur NEGATIVE NEGATIVE mg/dL   Nitrite NEGATIVE NEGATIVE   Leukocytes, UA NEGATIVE NEGATIVE  Urine microscopic-add on     Status: Abnormal   Collection Time: 02/14/15  2:18 PM  Result Value Ref Range   Squamous Epithelial / LPF 0-5 (A) NONE SEEN    Comment: Please note change in reference range.   WBC, UA 0-5 0 - 5 WBC/hpf    Comment: Please note change in reference range.   RBC / HPF 0-5 0 - 5 RBC/hpf    Comment: Please note change in  reference range.   Bacteria, UA RARE (A) NONE SEEN    Comment: Please note change in reference range.  Hemoglobin A1c     Status: Abnormal   Collection Time: 02/14/15  7:36 PM  Result Value Ref Range   Hgb A1c MFr Bld 9.8 (H) 4.8 - 5.6 %    Comment: (NOTE)         Pre-diabetes: 5.7 - 6.4         Diabetes: >6.4         Glycemic control for adults with diabetes: <7.0    Mean Plasma Glucose 235 mg/dL    Comment: (NOTE) Performed At: [facility] [place], [place] [id number] Lindon Romp MD [id number]   Lipid panel     Status: Abnormal   Collection Time: 02/14/15  7:38 PM  Result Value Ref Range   Cholesterol 207 (H) 0 - 200 mg/dL   Triglycerides 262 (H) <150 mg/dL   HDL 32 (L) >40 mg/dL   Total CHOL/HDL Ratio 6.5 RATIO   VLDL 52 (H) 0 - 40 mg/dL   LDL Cholesterol 123 (H) 0 - 99 mg/dL    Comment:        Total Cholesterol/HDL:CHD Risk Coronary Heart Disease Risk Table                     Men   Women  1/2 Average Risk   3.4   3.3  Average Risk       5.0   4.4  2 X Average Risk   9.6   7.1  3 X Average Risk  23.4   11.0        Use the calculated Patient Ratio above and the CHD Risk Table to determine the patient's CHD Risk.        ATP III CLASSIFICATION (LDL):  <100     mg/dL   Optimal  100-129  mg/dL   Near or Above                    Optimal  130-159  mg/dL   Borderline  160-189  mg/dL   High  >  190     mg/dL   Very High   Glucose, capillary     Status: None   Collection Time: 02/14/15  8:57 PM  Result Value Ref Range   Glucose-Capillary 86 65 - 99 mg/dL  Comprehensive metabolic panel     Status: Abnormal   Collection Time: 02/15/15  2:55 AM  Result Value Ref Range   Sodium 134 (L) 135 - 145 mmol/L   Potassium 4.7 3.5 - 5.1 mmol/L   Chloride 105 101 - 111 mmol/L   CO2 20 (L) 22 - 32 mmol/L   Glucose, Bld 99 65 - 99 mg/dL   BUN 32 (H) 6 - 20 mg/dL   Creatinine, Ser 1.82 (H) 0.61 - 1.24 mg/dL   Calcium 8.5 (L) 8.9 - 10.3 mg/dL    Total Protein 6.9 6.5 - 8.1 g/dL   Albumin 3.4 (L) 3.5 - 5.0 g/dL   AST 24 15 - 41 U/L   ALT 27 17 - 63 U/L   Alkaline Phosphatase 61 38 - 126 U/L   Total Bilirubin 0.8 0.3 - 1.2 mg/dL   GFR calc non Af Amer 39 (L) >60 mL/min   GFR calc Af Amer 46 (L) >60 mL/min    Comment: (NOTE) The eGFR has been calculated using the CKD EPI equation. This calculation has not been validated in all clinical situations. eGFR's persistently <60 mL/min signify possible Chronic Kidney Disease.    Anion gap 9 5 - 15  CBC     Status: Abnormal   Collection Time: 02/15/15  2:55 AM  Result Value Ref Range   WBC 6.2 4.0 - 10.5 K/uL   RBC 4.49 4.22 - 5.81 MIL/uL   Hemoglobin 12.9 (L) 13.0 - 17.0 g/dL   HCT 39.4 39.0 - 52.0 %   MCV 87.8 78.0 - 100.0 fL   MCH 28.7 26.0 - 34.0 pg   MCHC 32.7 30.0 - 36.0 g/dL   RDW 13.9 11.5 - 15.5 %   Platelets 243 150 - 400 K/uL  Lipase, blood     Status: None   Collection Time: 02/15/15  2:55 AM  Result Value Ref Range   Lipase 27 11 - 51 U/L  Hemoglobin A1c     Status: Abnormal   Collection Time: 02/15/15  2:55 AM  Result Value Ref Range   Hgb A1c MFr Bld 9.9 (H) 4.8 - 5.6 %    Comment: (NOTE)         Pre-diabetes: 5.7 - 6.4         Diabetes: >6.4         Glycemic control for adults with diabetes: <7.0    Mean Plasma Glucose 237 mg/dL    Comment: (NOTE) Performed At: [facility] [address] [place], [place] [id number] Lindon Romp MD [id number]   Glucose, capillary     Status: None   Collection Time: 02/15/15  7:56 AM  Result Value Ref Range   Glucose-Capillary 84 65 - 99 mg/dL  Glucose, capillary     Status: Abnormal   Collection Time: 02/15/15 11:49 AM  Result Value Ref Range   Glucose-Capillary 100 (H) 65 - 99 mg/dL  Glucose, capillary     Status: Abnormal   Collection Time: 02/15/15  5:08 PM  Result Value Ref Range   Glucose-Capillary 110 (H) 65 - 99 mg/dL  Glucose, capillary     Status: Abnormal   Collection Time: 02/15/15   9:02 PM  Result Value Ref Range   Glucose-Capillary 123 (  H) 65 - 99 mg/dL  CBC     Status: None   Collection Time: 02/16/15  2:11 AM  Result Value Ref Range   WBC 5.4 4.0 - 10.5 K/uL   RBC 4.59 4.22 - 5.81 MIL/uL   Hemoglobin 13.1 13.0 - 17.0 g/dL   HCT 40.6 39.0 - 52.0 %   MCV 88.5 78.0 - 100.0 fL   MCH 28.5 26.0 - 34.0 pg   MCHC 32.3 30.0 - 36.0 g/dL   RDW 14.0 11.5 - 15.5 %   Platelets 272 150 - 400 K/uL  Comprehensive metabolic panel     Status: Abnormal   Collection Time: 02/16/15  2:11 AM  Result Value Ref Range   Sodium 137 135 - 145 mmol/L   Potassium 4.6 3.5 - 5.1 mmol/L   Chloride 106 101 - 111 mmol/L   CO2 22 22 - 32 mmol/L   Glucose, Bld 88 65 - 99 mg/dL   BUN 25 (H) 6 - 20 mg/dL   Creatinine, Ser 1.77 (H) 0.61 - 1.24 mg/dL   Calcium 9.1 8.9 - 10.3 mg/dL   Total Protein 7.2 6.5 - 8.1 g/dL   Albumin 3.6 3.5 - 5.0 g/dL   AST 23 15 - 41 U/L   ALT 25 17 - 63 U/L   Alkaline Phosphatase 71 38 - 126 U/L   Total Bilirubin 0.8 0.3 - 1.2 mg/dL   GFR calc non Af Amer 41 (L) >60 mL/min   GFR calc Af Amer 47 (L) >60 mL/min    Comment: (NOTE) The eGFR has been calculated using the CKD EPI equation. This calculation has not been validated in all clinical situations. eGFR's persistently <60 mL/min signify possible Chronic Kidney Disease.    Anion gap 9 5 - 15  Glucose, capillary     Status: Abnormal   Collection Time: 02/16/15  7:59 AM  Result Value Ref Range   Glucose-Capillary 101 (H) 65 - 99 mg/dL   Comment 1 Notify RN      Lipid Panel     Component Value Date/Time   CHOL 207* 02/14/2015 1938   TRIG 262* 02/14/2015 1938   HDL 32* 02/14/2015 1938   CHOLHDL 6.5 02/14/2015 1938   VLDL 52* 02/14/2015 1938   LDLCALC 123* 02/14/2015 1938     Lab Results  Component Value Date   HGBA1C 9.9* 02/15/2015   HGBA1C 9.8* 02/14/2015   HGBA1C * 04/29/2008    13.8 (NOTE)   The ADA recommends the following therapeutic goal for glycemic   control related to Hgb A1C  measurement:   Goal of Therapy:   < 7.0% Hgb A1C   Reference: American Diabetes Association: Clinical Practice   Recommendations 2008, Diabetes Care,  2008, 31:(Suppl 1).     Lab Results  Component Value Date   MICROALBUR 10.60* 03/31/2007   LDLCALC 123* 02/14/2015   CREATININE 1.77* 02/16/2015     HPI :Jonathan Mcneil is a very pleasant 59 y.o. male with a past medical history that includes hypertension, diabetes, schizophrenia presents to the emergency department with the chief complaint of low back pain and abdominal pain. Initial evaluation reveals acute on chronic renal failure, hyponatremia, pancreatitis.  Patient reports developing low back pain 2 or 3 days ago. Describes the pain as in his lumbar spine constant radiating down the front of both legs in a "muscle cramp" type pain. He reports some numbness and tingling but no worse than his usual. Also developed abdominal pain described as sharp located  in the lower quadrant radiating to the upper left quadrant. Associated symptoms include chills and some nausea without emesis and 2 episodes of loose stool as well as headache. He denies chest pain palpitation dizziness syncope or near-syncope. He denies dysuria hematuria. He denies melena or hematochezia. He reports slight decrease in oral intake during this time. He does report approximately 2 weeks ago his insulin Levemir was changed to [place].   Workup in the emergency department includes comprehensive metabolic panel significant for sodium of 130, chloride 98 BUN 38 creatinine 2.2, complete blood count unremarkable, lipase 256, urinalysis unremarkable.  He is afebrile hemodynamically stable and not hypoxic. In the emergency department he is provided with 1 L of normal saline intravenously he is provided with 4 mg of morphine 4 mg of Zofran.*   HOSPITAL COURSE:     1. Acute Pancreatitis. Denies EtOH. Ultrasound of the gallbladder negative. Lipase 256, now improved, 27 prior  to discharge.. Patient hydrated aggressively with IV fluids and kept nothing by mouth initially diet slowly advanced to soft low fat diet. . Triglycerides 260. Mild acute pancreatitis likely secondary to T[place]hiba. Improved with supportive care, patient anticipated to go home today 2. Acute on chronic kidney disease stage III. Chart review indicates baseline creatinine range 1.4-1.7. Currently his 1.77 improved from 2.22 upon admission. Lisinopril/HCTZ placed on hold, may be restarted next week by PCP 3. Back pain/neuropathy. 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 Physical exam benign. MRI of the lumbar spine shows degenerative changes L2-L3, L5-S1 without any high-grade spinal stenosis or foraminal narrowing. 4. Abdominal pain. Likely related to #1. Analgesia as noted 5. Hyponatremia. Likely related to decreased oral intake secondary to #1. Improving with IV fluids as noted above. Sodium 137. 6. Diabetes. Medication changed 2 weeks ago as noted above. On admission serum glucose 149. Marland Kitchen CBG well controlled on Levemir 10 units at bedtime. Increase to 20 units at bedtime prior to discharge as the patient's diet is being advanced. Patient to follow-up with PCP for reviewing his diabetes regimen. Hemoglobin A1c 9.9. 7. Schizo-effective psychosis. Stable at baseline. Holding asenapine maleate for now do to #1. 8. Hypertension. Controlled. Will hold lisinopril /HCTZ. Prescription for Norvasc provided until lisinopril/HCTZ can be resumed    Discharge Exam:    Blood pressure 116/78, pulse 70, temperature 98.6 F (37 C), temperature source Oral, resp. rate 18, height 5' 9" (1.753 m), weight 104.6 kg (230 lb 9.6 oz), SpO2 97 %.  Gen: Alert and oriented 3, NAD HEENT: neck supple, no JVD CVS: S1 and S2 clear, regular rate and rhythm Chest: clear to auscultation bilaterally, no wheezing rales or rhonchi Abdomen: Soft, mild epigastric tenderness, nondistended, normal bowel  sounds Extremities: No cyanosis clubbing or edema noted Neuro: Cranial nerves II-12 intact, strength 5/5 in upper and lower extremeties bilaterally. Back : Tender in the lumbar region  Skin: No rashes Psych: Alert and oriented 3, normal affect         Follow-up Information    Follow up with [facility], [place]. Schedule an appointment as soon as possible for a visit in 3 days.   Specialty:  Internal Medicine   Contact information:   [address] [place] [address] [phone]       Signed: Reyne Dumas 02/16/2015, 12:16 PM        Time spent >45 mins

## 2015-02-23 DIAGNOSIS — E1021 Type 1 diabetes mellitus with diabetic nephropathy: Secondary | ICD-10-CM | POA: Diagnosis not present

## 2015-02-23 DIAGNOSIS — I10 Essential (primary) hypertension: Secondary | ICD-10-CM | POA: Diagnosis not present

## 2015-02-23 DIAGNOSIS — E1165 Type 2 diabetes mellitus with hyperglycemia: Secondary | ICD-10-CM | POA: Diagnosis not present

## 2015-02-23 DIAGNOSIS — K8500 Idiopathic acute pancreatitis without necrosis or infection: Secondary | ICD-10-CM | POA: Diagnosis not present

## 2015-04-13 DIAGNOSIS — E1165 Type 2 diabetes mellitus with hyperglycemia: Secondary | ICD-10-CM | POA: Diagnosis not present

## 2015-04-13 DIAGNOSIS — E1021 Type 1 diabetes mellitus with diabetic nephropathy: Secondary | ICD-10-CM | POA: Diagnosis not present

## 2015-04-20 DIAGNOSIS — I10 Essential (primary) hypertension: Secondary | ICD-10-CM | POA: Diagnosis not present

## 2015-04-20 DIAGNOSIS — E782 Mixed hyperlipidemia: Secondary | ICD-10-CM | POA: Diagnosis not present

## 2015-04-20 DIAGNOSIS — E1165 Type 2 diabetes mellitus with hyperglycemia: Secondary | ICD-10-CM | POA: Diagnosis not present

## 2015-05-18 DIAGNOSIS — E1165 Type 2 diabetes mellitus with hyperglycemia: Secondary | ICD-10-CM | POA: Diagnosis not present

## 2015-05-18 DIAGNOSIS — E1021 Type 1 diabetes mellitus with diabetic nephropathy: Secondary | ICD-10-CM | POA: Diagnosis not present

## 2015-05-24 DIAGNOSIS — E1065 Type 1 diabetes mellitus with hyperglycemia: Secondary | ICD-10-CM | POA: Diagnosis not present

## 2015-06-29 ENCOUNTER — Telehealth (HOSPITAL_COMMUNITY): Payer: Self-pay

## 2015-06-29 NOTE — Telephone Encounter (Signed)
Telephone message left for patient's wife that we had received disability forms from [employer] to complete for patient.  Informed patient has not been seen at this office since 08/20/13 by Dr. Casimiro Needle and would need to make a complete new evaluation appointment with Dr. Casimiro Needle for him to consider completing after evaluation.  Informed without seeing patient for almost 2 years he could not complete the forms she had requested.  Requested she or Mr. Ravert call back if questions.

## 2015-07-19 DIAGNOSIS — Z0000 Encounter for general adult medical examination without abnormal findings: Secondary | ICD-10-CM | POA: Diagnosis not present

## 2015-07-19 DIAGNOSIS — M150 Primary generalized (osteo)arthritis: Secondary | ICD-10-CM | POA: Diagnosis not present

## 2015-07-19 DIAGNOSIS — R748 Abnormal levels of other serum enzymes: Secondary | ICD-10-CM | POA: Diagnosis not present

## 2015-07-19 DIAGNOSIS — E1021 Type 1 diabetes mellitus with diabetic nephropathy: Secondary | ICD-10-CM | POA: Diagnosis not present

## 2015-07-19 DIAGNOSIS — E782 Mixed hyperlipidemia: Secondary | ICD-10-CM | POA: Diagnosis not present

## 2015-07-19 DIAGNOSIS — I10 Essential (primary) hypertension: Secondary | ICD-10-CM | POA: Diagnosis not present

## 2015-07-19 DIAGNOSIS — E1165 Type 2 diabetes mellitus with hyperglycemia: Secondary | ICD-10-CM | POA: Diagnosis not present

## 2015-07-26 DIAGNOSIS — I10 Essential (primary) hypertension: Secondary | ICD-10-CM | POA: Diagnosis not present

## 2015-07-26 DIAGNOSIS — E782 Mixed hyperlipidemia: Secondary | ICD-10-CM | POA: Diagnosis not present

## 2015-07-26 DIAGNOSIS — E1165 Type 2 diabetes mellitus with hyperglycemia: Secondary | ICD-10-CM | POA: Diagnosis not present

## 2015-07-26 DIAGNOSIS — N183 Chronic kidney disease, stage 3 (moderate): Secondary | ICD-10-CM | POA: Diagnosis not present

## 2015-08-28 DIAGNOSIS — E1065 Type 1 diabetes mellitus with hyperglycemia: Secondary | ICD-10-CM | POA: Diagnosis not present

## 2015-10-18 ENCOUNTER — Encounter (HOSPITAL_COMMUNITY): Payer: Self-pay | Admitting: *Deleted

## 2015-10-18 ENCOUNTER — Emergency Department (HOSPITAL_COMMUNITY)
Admission: EM | Admit: 2015-10-18 | Discharge: 2015-10-18 | Disposition: A | Discharge status: Home / Self Care | Payer: Medicare Other | Attending: Emergency Medicine | Admitting: Emergency Medicine

## 2015-10-18 DIAGNOSIS — Z87891 Personal history of nicotine dependence: Secondary | ICD-10-CM | POA: Insufficient documentation

## 2015-10-18 DIAGNOSIS — I129 Hypertensive chronic kidney disease with stage 1 through stage 4 chronic kidney disease, or unspecified chronic kidney disease: Secondary | ICD-10-CM | POA: Insufficient documentation

## 2015-10-18 DIAGNOSIS — M79671 Pain in right foot: Secondary | ICD-10-CM | POA: Diagnosis present

## 2015-10-18 DIAGNOSIS — Z79899 Other long term (current) drug therapy: Secondary | ICD-10-CM | POA: Insufficient documentation

## 2015-10-18 DIAGNOSIS — E1122 Type 2 diabetes mellitus with diabetic chronic kidney disease: Secondary | ICD-10-CM | POA: Diagnosis not present

## 2015-10-18 DIAGNOSIS — Z794 Long term (current) use of insulin: Secondary | ICD-10-CM | POA: Diagnosis not present

## 2015-10-18 DIAGNOSIS — N189 Chronic kidney disease, unspecified: Secondary | ICD-10-CM | POA: Diagnosis not present

## 2015-10-18 DIAGNOSIS — E1140 Type 2 diabetes mellitus with diabetic neuropathy, unspecified: Secondary | ICD-10-CM | POA: Insufficient documentation

## 2015-10-18 DIAGNOSIS — E11610 Type 2 diabetes mellitus with diabetic neuropathic arthropathy: Secondary | ICD-10-CM | POA: Diagnosis not present

## 2015-10-18 MED ORDER — ACETAMINOPHEN 500 MG PO TABS
1000.0000 mg | ORAL_TABLET | Freq: Once | ORAL | Status: AC
  Administered 2015-10-18: 1000 mg via ORAL
  Filled 2015-10-18: qty 2

## 2015-10-18 MED ORDER — TRAMADOL HCL 50 MG PO TABS: 50.0000 mg | ORAL_TABLET | Freq: Two times a day (BID) | ORAL | 0 refills | Status: DC | PRN

## 2015-10-18 MED ORDER — TRAMADOL HCL 50 MG PO TABS
50.0000 mg | ORAL_TABLET | Freq: Once | ORAL | Status: AC
  Administered 2015-10-18: 50 mg via ORAL
  Filled 2015-10-18: qty 1

## 2015-10-18 NOTE — ED Provider Notes (Addendum)
[facility] DEPT Provider Note   CSN: [id number] Arrival date & time: 10/18/15  2154  By signing my name below, I, Jonathan Mcneil, attest that this documentation has been prepared under the direction and in the presence of Everlene Balls, MD. Electronically Signed: Judithann Sauger, ED Scribe. 10/18/15. 11:24 PM.    History   Chief Complaint Chief Complaint  Patient presents with  . Foot Pain    HPI Comments: Jonathan Mcneil is a 60 y.o. male with a hx of DM, who presents to the Emergency Department complaining of gradually worsening moderate right foot pain onset yesterday. He notes his pain as numbness. No alleviating factors noted. He has not tried any medications PTA. Pt explains that he has had these symptoms in the past but Tylenol usually alleviates it. No recent falls, injuries, or trauma. He denies that he has been placed on Gabapentin or any pain medication for Neuropathy. No fever, chills, foot swelling, or weakness.   The history is provided by the patient. No language interpreter was used.  Foot Pain  This is a recurrent problem. The current episode started yesterday. The problem has been gradually worsening. Pertinent negatives include no chest pain and no shortness of breath. Nothing aggravates the symptoms. Nothing relieves the symptoms. He has tried nothing for the symptoms.    Past Medical History:  Diagnosis Date  . CKD (chronic kidney disease)   . Diabetes mellitus without complication ([place])   . Hypertension   . Low back pain   . Pancreatitis   . Schizophrenia [facility])     Patient Active Problem List   Diagnosis Date Noted  . Acute encephalopathy 02/15/2015  . CKD (chronic kidney disease) 02/14/2015  . Acute kidney injury superimposed on chronic kidney disease ([place]) 02/14/2015  . Hyponatremia 02/14/2015  . Back pain 02/14/2015  . Neuropathy ([name]) 02/14/2015  . Obesity 02/14/2015  . Abdominal pain 02/14/2015  . Pancreatitis   . Diabetes mellitus  without complication ([place])   . Pancreatitis, acute   . Schizo-affective psychosis ([place]) 04/28/2013    History reviewed. No pertinent surgical history.     Home Medications    Prior to Admission medications   Medication Sig Start Date End Date Taking? Authorizing Provider  amLODipine (NORVASC) 5 MG tablet Take 1 tablet (5 mg total) by mouth daily. 02/16/15   Reyne Dumas, MD  Asenapine Maleate 10 MG SUBL Place 1 tablet (10 mg total) under the tongue at bedtime. Do not eat or drink for 30 minutes before or after taking medication Patient not taking: Reported on 02/14/2015 08/24/13   Norma Fredrickson, MD  insulin detemir (LEVEMIR) 100 UNIT/ML injection Inject 0.2 mLs (20 Units total) into the skin at bedtime. 02/16/15   Reyne Dumas, MD  PRESCRIPTION MEDICATION Take 1 capsule by mouth daily. Study drug for diabetes. Dr. Ashby Dawes office    Historical Provider, MD    Family History No family history on file.  Social History Social History  Substance Use Topics  . Smoking status: Former Research scientist (life sciences)  . Smokeless tobacco: Never Used  . Alcohol use No     Allergies   Ibuprofen and Penicillins   Review of Systems Review of Systems  Constitutional: Negative for chills and fever.  Respiratory: Negative for shortness of breath.   Cardiovascular: Negative for chest pain.  Musculoskeletal: Positive for arthralgias. Negative for joint swelling.  Neurological: Positive for numbness. Negative for weakness.  All other systems reviewed and are negative.    Physical Exam Updated Vital Signs  BP 154/99   Pulse 89   Temp 98.1 F (36.7 C) (Oral)   Resp 18   Ht 5\' 9"  (1.753 m)   Wt 220 lb (99.8 kg)   SpO2 100%   BMI 32.49 kg/m   Physical Exam  Constitutional: He is oriented to person, place, and time. Vital signs are normal. He appears well-developed and well-nourished.  Non-toxic appearance. He does not appear ill. No distress.  HENT:  Head: Normocephalic and atraumatic.  Nose:  Nose normal.  Mouth/Throat: Oropharynx is clear and moist. No oropharyngeal exudate.  Eyes: Conjunctivae and EOM are normal. Pupils are equal, round, and reactive to light. No scleral icterus.  Neck: Normal range of motion. Neck supple. No tracheal deviation, no edema, no erythema and normal range of motion present. No thyroid mass and no thyromegaly present.  Cardiovascular: Normal rate, regular rhythm, S1 normal, S2 normal, normal heart sounds, intact distal pulses and normal pulses.  Exam reveals no gallop and no friction rub.   No murmur heard. Pulmonary/Chest: Effort normal and breath sounds normal. No respiratory distress. He has no wheezes. He has no rhonchi. He has no rales.  Abdominal: Soft. Normal appearance and bowel sounds are normal. He exhibits no distension, no ascites and no mass. There is no hepatosplenomegaly. There is no tenderness. There is no rebound, no guarding and no CVA tenderness.  Musculoskeletal: Normal range of motion. He exhibits no edema or tenderness.  Right foot: no swelling, erythema, or TTP Normal sensation   Lymphadenopathy:    He has no cervical adenopathy.  Neurological: He is alert and oriented to person, place, and time. He has normal strength. No cranial nerve deficit or sensory deficit.  Skin: Skin is warm, dry and intact. No petechiae and no rash noted. He is not diaphoretic. No erythema. No pallor.  Nursing note and vitals reviewed.    ED Treatments / Results  DIAGNOSTIC STUDIES: Oxygen Saturation is 100% on RA, normal by my interpretation.    COORDINATION OF CARE: 11:21 PM- Pt advised of plan for treatment and pt agrees. Pt will receive Tylenol.    Labs (all labs ordered are listed, but only abnormal results are displayed) Labs Reviewed - No data to display  EKG  EKG Interpretation None       Radiology No results found.  Procedures Procedures (including critical care time)  Medications Ordered in ED Medications - No data to  display   Initial Impression / Assessment and Plan / ED Course  Everlene Balls, MD has reviewed the triage vital signs and the nursing notes.  Pertinent labs & imaging results that were available during my care of the patient were reviewed by me and considered in my medical decision making (see chart for details).  Clinical Course    Patient presents to the ED for foot pain he states is consistent with his diabetic neuropathy.  PE of the foot is completely normal.  He states tylenol normally helps but he ran out.  Will give in the ED and DC with tylenol to take as needed. PCP fu advised. He appears well and in NAD. Vs remain within his normal limits and he is safe for DC.  Final Clinical Impressions(s) / ED Diagnoses   Final diagnoses:  None    New Prescriptions New Prescriptions   No medications on file      I personally performed the services described in this documentation, which was scribed in my presence. The recorded information has been reviewed and is  accurate.       Everlene Balls, MD 10/18/15 [id number]    Everlene Balls, MD 10/18/15 2340

## 2015-10-18 NOTE — ED Notes (Signed)
Patient verbalized understanding of discharge instructions and denies any further needs or questions at this time. VS stable. Patient ambulatory with steady gait.  

## 2015-10-18 NOTE — ED Triage Notes (Signed)
Patient presents stating he has pain to the right foot at the joint of the great toe and it hurts so bad that it is causing him to have a headache,

## 2015-10-18 NOTE — ED Notes (Signed)
MD at bedside. 

## 2015-10-18 NOTE — ED Notes (Signed)
Patient requesting to speak with doctor again. MD aware.

## 2015-10-22 ENCOUNTER — Encounter (HOSPITAL_COMMUNITY): Payer: Self-pay | Admitting: Emergency Medicine

## 2015-10-22 ENCOUNTER — Emergency Department (HOSPITAL_COMMUNITY)
Admission: EM | Admit: 2015-10-22 | Discharge: 2015-10-22 | Disposition: A | Discharge status: Home / Self Care | Payer: Medicare Other | Attending: Emergency Medicine | Admitting: Emergency Medicine

## 2015-10-22 DIAGNOSIS — L509 Urticaria, unspecified: Secondary | ICD-10-CM

## 2015-10-22 DIAGNOSIS — E1140 Type 2 diabetes mellitus with diabetic neuropathy, unspecified: Secondary | ICD-10-CM | POA: Diagnosis not present

## 2015-10-22 DIAGNOSIS — Z87891 Personal history of nicotine dependence: Secondary | ICD-10-CM | POA: Diagnosis not present

## 2015-10-22 DIAGNOSIS — E1122 Type 2 diabetes mellitus with diabetic chronic kidney disease: Secondary | ICD-10-CM | POA: Diagnosis not present

## 2015-10-22 DIAGNOSIS — I129 Hypertensive chronic kidney disease with stage 1 through stage 4 chronic kidney disease, or unspecified chronic kidney disease: Secondary | ICD-10-CM | POA: Insufficient documentation

## 2015-10-22 DIAGNOSIS — N189 Chronic kidney disease, unspecified: Secondary | ICD-10-CM | POA: Diagnosis not present

## 2015-10-22 LAB — CBG MONITORING, ED: GLUCOSE-CAPILLARY: 227 mg/dL — AB (ref 65–99)

## 2015-10-22 MED ORDER — PREDNISONE 10 MG PO TABS
40.0000 mg | ORAL_TABLET | Freq: Every day | ORAL | 0 refills | Status: AC
Start: 2015-10-23 — End: 2015-10-27

## 2015-10-22 MED ORDER — PREDNISONE 20 MG PO TABS
40.0000 mg | ORAL_TABLET | Freq: Once | ORAL | Status: AC
  Administered 2015-10-22: 40 mg via ORAL
  Filled 2015-10-22: qty 2

## 2015-10-22 MED ORDER — DIPHENHYDRAMINE HCL 25 MG PO CAPS: 50.0000 mg | ORAL_CAPSULE | Freq: Three times a day (TID) | ORAL | 0 refills | Status: DC | PRN

## 2015-10-22 MED ORDER — FAMOTIDINE 20 MG PO TABS
40.0000 mg | ORAL_TABLET | Freq: Once | ORAL | Status: AC
  Administered 2015-10-22: 40 mg via ORAL
  Filled 2015-10-22: qty 2

## 2015-10-22 MED ORDER — EPINEPHRINE 0.3 MG/0.3ML IJ SOAJ: 0.3000 mg | Freq: Once | INTRAMUSCULAR | 1 refills | Status: AC

## 2015-10-22 MED ORDER — DIPHENHYDRAMINE HCL 25 MG PO CAPS
50.0000 mg | ORAL_CAPSULE | Freq: Once | ORAL | Status: AC
  Administered 2015-10-22: 50 mg via ORAL
  Filled 2015-10-22: qty 2

## 2015-10-22 NOTE — ED Provider Notes (Signed)
Emergency Department Provider Note   I have reviewed the triage vital signs and the nursing notes.   HISTORY  Chief Complaint Urticaria   HPI Jonathan Mcneil is a 59 y.o. male with PMH of CKD, DM, and peripheral neuropathy presents to the emergency department for evaluation of itching, diffuse rash since restarting his gabapentin. Patient states she was seen in the emergency department recently and was represcribed gabapentin. He is taken this in the past but restarted after ED discharge. He abruptly developed itching under his arms and chest. Has some itching sensation of his upper lip. He reports some tongue numbness but no swelling, difficulty breathing, difficulty swallowing. Denies any lip swelling or sensation of throat closure. No lesions in the mouth. He has not started any other new medications. He has no prior history of allergic reactions to drugs or food.    Past Medical History:  Diagnosis Date  . CKD (chronic kidney disease)   . Diabetes mellitus without complication ([place])   . Hypertension   . Low back pain   . Pancreatitis   . Schizophrenia [facility])     Patient Active Problem List   Diagnosis Date Noted  . Acute encephalopathy 02/15/2015  . CKD (chronic kidney disease) 02/14/2015  . Acute kidney injury superimposed on chronic kidney disease ([place]) 02/14/2015  . Hyponatremia 02/14/2015  . Back pain 02/14/2015  . Neuropathy ([place]) 02/14/2015  . Obesity 02/14/2015  . Abdominal pain 02/14/2015  . Pancreatitis   . Diabetes mellitus without complication ([place])   . Pancreatitis, acute   . Schizo-affective psychosis ([place]) 04/28/2013    History reviewed. No pertinent surgical history.  Current Outpatient Rx  . Order #: [id number] Class: Print  . Order #: [id number] Class: Phone In  . Order #: [id number] Class: Normal  . Order #: [id number] Class: Historical Med  . Order #: [id number] Class: Print    Allergies Ibuprofen and Penicillins  No family history on  file.  Social History Social History  Substance Use Topics  . Smoking status: Former Research scientist (life sciences)  . Smokeless tobacco: Never Used  . Alcohol use No    Review of Systems  Constitutional: No fever/chills Eyes: No visual changes. ENT: No sore throat. Cardiovascular: Denies chest pain. Respiratory: Denies shortness of breath. Gastrointestinal: No abdominal pain.  No nausea, no vomiting.  No diarrhea.  No constipation. Genitourinary: Negative for dysuria. Musculoskeletal: Negative for back pain. Skin: Itching skin rash Neurological: Negative for headaches, focal weakness or numbness.  10-point ROS otherwise negative.  ____________________________________________   PHYSICAL EXAM:  VITAL SIGNS: ED Triage Vitals [10/22/15 1928]  Enc Vitals Group     BP 159/96     Pulse Rate 95     Resp 18     Temp 98.2 F (36.8 C)     Temp Source Oral     Weight 217 lb (98.4 kg)     Height 5\' 9"  (1.753 m)   Constitutional: Alert and oriented. Well appearing and in no acute distress. Eyes: Conjunctivae are normal. PERRL. EOMI. Head: Atraumatic. Nose: No congestion/rhinnorhea. Mouth/Throat: Mucous membranes are moist.  Oropharynx non-erythematous. No mouth ulcerations. No swelling of tongue or lips. Managing oral secretions and speaking in normal tone of voice.  Neck: No stridor.   Cardiovascular: Normal rate, regular rhythm. Good peripheral circulation. Grossly normal heart sounds.   Respiratory: Normal respiratory effort.  No retractions. Lungs CTAB. Gastrointestinal: Soft and nontender. No distention.  Musculoskeletal: No lower extremity tenderness nor edema. No gross deformities of extremities. Neurologic:  Normal speech  and language. No gross focal neurologic deficits are appreciated.  Skin:  Skin is warm, dry and intact. Faint, sparse rash over upper extremities and trunk.  Psychiatric: Mood and affect are normal. Speech and behavior are  normal.  ____________________________________________   LABS (all labs ordered are listed, but only abnormal results are displayed)  Labs Reviewed  CBG MONITORING, ED - Abnormal; Notable for the following:       Result Value   Glucose-Capillary 227 (*)    All other components within normal limits   ____________________________________________   PROCEDURES  Procedure(s) performed:   Procedures  None ____________________________________________   INITIAL IMPRESSION / ASSESSMENT AND PLAN / ED COURSE  Pertinent labs & imaging results that were available during my care of the patient were reviewed by me and considered in my medical decision making (see chart for details).  Patient presents to the emergency department for evaluation of urticaria. His rash is consistent with allergic reaction. He is speaking in normal tone of voice. No intraoral edema or evidence of acute airway involvement. Suspect that restarting the gabapentin may have caused his symptoms is no other medications are new although I discussed with the patient that it is impossible to truly know what caused his reaction. He will follow with his primary care physician in the coming week and discontinue the gabapentin for now. He was given tramadol during his last ED visit but did not take this medication for fear of adverse side effects after reading more about the medication. Patient not on ACE/ARB. No evidence of angioedema.   Will give Benadryl, steroid, Pepcid in the emergency department. Will discharge with Epipen and discuss it's use in detail. Also discussed that the steroid may increase his home blood sugar readings but this is temporary and will resolve once the medication course is through.  At this time, I do not feel there is any life-threatening condition present. I have reviewed and discussed all results (EKG, imaging, lab, urine as appropriate), exam findings with patient. I have reviewed nursing notes and  appropriate previous records.  I feel the patient is safe to be discharged home without further emergent workup. Discussed usual and customary return precautions. Patient and family (if present) verbalize understanding and are comfortable with this plan.  Patient will follow-up with their primary care provider. If they do not have a primary care provider, information for follow-up has been provided to them. All questions have been answered. ____________________________________________  FINAL CLINICAL IMPRESSION(S) / ED DIAGNOSES  Final diagnoses:  Urticaria     MEDICATIONS GIVEN DURING THIS VISIT:  Medications  diphenhydrAMINE (BENADRYL) capsule 50 mg (50 mg Oral Given 10/22/15 2056)  famotidine (PEPCID) tablet 40 mg (40 mg Oral Given 10/22/15 2057)  predniSONE (DELTASONE) tablet 40 mg (40 mg Oral Given 10/22/15 2056)     NEW OUTPATIENT MEDICATIONS STARTED DURING THIS VISIT:  Discharge Medication List as of 10/22/2015  9:35 PM    START taking these medications   Details  diphenhydrAMINE (BENADRYL) 25 mg capsule Take 2 capsules (50 mg total) by mouth every 8 (eight) hours as needed for itching., Starting Sun 10/22/2015, Print    EPINEPHrine 0.3 mg/0.3 mL IJ SOAJ injection Inject 0.3 mLs (0.3 mg total) into the muscle once., Starting Sun 10/22/2015, Print    predniSONE (DELTASONE) 10 MG tablet Take 4 tablets (40 mg total) by mouth daily., Starting Mon 10/23/2015, Until Fri 10/27/2015, Print          Note:  This document was prepared using  [employer] training and education officer and may include unintentional dictation errors.  Nanda Quinton, MD Emergency Medicine   Margette Fast, MD 10/22/15 2322

## 2015-10-22 NOTE — ED Triage Notes (Signed)
Pt. reports generalized itchy skin rashes /hives onset last night . Airway intact / no oral swelling , respirations unlabored / denies fever or chills . Pt. suspects reaction to Gabapentin .

## 2015-10-22 NOTE — Discharge Instructions (Signed)
You have been seen for hives.  We do not know exactly what caused them, but they responded well to medications.  Please take any medications you were prescribed today according to label instructions, or over-the-counter medications such as Benadryl.  Follow up as recommended in these instructions.  If you think the hives were caused by exposure to something new (food, medication, etc), please avoid that exposure again, at least until you can follow up with your regular doctor.  If your symptoms worsen, especially if you develop any respiratory issues (difficulty breathing, loud breathing (stridor), swelling of the tongue or face), or any other symptoms that concern you, please return immediately to the Emergency Department.

## 2015-11-20 ENCOUNTER — Ambulatory Visit (INDEPENDENT_AMBULATORY_CARE_PROVIDER_SITE_OTHER): Payer: Medicare Other

## 2015-11-20 ENCOUNTER — Ambulatory Visit (INDEPENDENT_AMBULATORY_CARE_PROVIDER_SITE_OTHER): Payer: Medicare Other | Admitting: Podiatry

## 2015-11-20 DIAGNOSIS — M2021 Hallux rigidus, right foot: Secondary | ICD-10-CM

## 2015-11-20 DIAGNOSIS — R52 Pain, unspecified: Secondary | ICD-10-CM

## 2015-11-20 DIAGNOSIS — M779 Enthesopathy, unspecified: Secondary | ICD-10-CM | POA: Diagnosis not present

## 2015-11-20 NOTE — Patient Instructions (Signed)
Hallux Rigidus Hallux rigidus is a condition involving pain and a loss of motion of the first (big) toe. The pain gets worse with lifting up (extension) of the toe. This is usually due to arthritic bony bumps (spurring) of the joint at the base of the big toe.  SYMPTOMS   Pain, with lifting up of the toe.  Tenderness over the joint where the big toe meets the foot.  Redness, swelling, and warmth over the top of the base of the big toe (sometimes).  Foot pain, stiffness, and limping. CAUSES  Hallux rigidus is caused by arthritis of the joint where the big toe meets the foot. The arthritis creates a bone spur that pinches the soft tissues when the toe is extended. RISK INCREASES WITH:  Tight shoes with a narrow toe box.  Family history of foot problems.  Gout and rheumatoid and psoriatic arthritis.  History of previous toe injury, including "turf toe."  Long first toe, flat feet, and other big toe bony bumps.  Arthritis of the big toe. PREVENTION   Wear wide-toed shoes that fit well.  Tape the big toe to reduce motion and to prevent pinching of the tissues between the bone.  Maintain physical fitness:  Foot and ankle flexibility.  Muscle strength and endurance. PROGNOSIS  This condition can usually be managed with proper treatment. However, surgery is typically required to prevent the problem from recurring.  RELATED COMPLICATIONS  Injury to other areas of the foot or ankle, caused by abnormal walking in an attempt to avoid the pain felt when walking normally. TREATMENT Treatment first involves stopping the activities that aggravate your symptoms. Ice and medicine can be used to reduce the pain and inflammation. Modifications to shoes may help reduce pain, including wearing stiff-soled shoes, shoes with a wide toe box, inserting a padded donut to relieve pressure on top of the joint, or wearing an arch support. Corticosteroid injections may be given to reduce inflammation. If  nonsurgical treatment is unsuccessful, surgery may be needed. Surgical options include removing the arthritic bony spur, cutting a bone in the foot to change the arc of motion (allowing the toe to extend more), or fusion of the joint (eliminating all motion in the joint at the base of the big toe).  MEDICATION   If pain medicine is needed, nonsteroidal anti-inflammatory medicines (aspirin and ibuprofen), or other minor pain relievers (acetaminophen), are often advised.  Do not take pain medicine for 7 days before surgery.  Prescription pain relievers are usually prescribed only after surgery. Use only as directed and only as much as you need.  Ointments for arthritis, applied to the skin, may give some relief.  Injections of corticosteroids may be given to reduce inflammation. HEAT AND COLD  Cold treatment (icing) relieves pain and reduces inflammation. Cold treatment should be applied for 10 to 15 minutes every 2 to 3 hours, and immediately after activity that aggravates your symptoms. Use ice packs or an ice massage.  Heat treatment may be used before performing the stretching and strengthening activities prescribed by your caregiver, physical therapist, or athletic trainer. Use a heat pack or a warm water soak. SEEK MEDICAL CARE IF:   Symptoms get worse or do not improve in 2 weeks, despite treatment.  After surgery you develop fever, increasing pain, redness, swelling, drainage of fluids, bleeding, or increasing warmth.  New, unexplained symptoms develop. (Drugs used in treatment may produce side effects.)   This information is not intended to replace advice given to   you by your health care provider. Make sure you discuss any questions you have with your health care provider.   Document Released: 03/11/2005 Document Revised: 04/01/2014 Document Reviewed: 06/23/2008 Elsevier Interactive Patient Education 2016 [place].   Diabetes and Foot Care Diabetes may cause you to have  problems because of poor blood supply (circulation) to your feet and legs. This may cause the skin on your feet to become thinner, break easier, and heal more slowly. Your skin may become dry, and the skin may peel and crack. You may also have nerve damage in your legs and feet causing decreased feeling in them. You may not notice minor injuries to your feet that could lead to infections or more serious problems. Taking care of your feet is one of the most important things you can do for yourself.  HOME CARE INSTRUCTIONS  Wear shoes at all times, even in the house. Do not go barefoot. Bare feet are easily injured.  Check your feet daily for blisters, cuts, and redness. If you cannot see the bottom of your feet, use a mirror or ask someone for help.  Wash your feet with warm water (do not use hot water) and mild soap. Then pat your feet and the areas between your toes until they are completely dry. Do not soak your feet as this can dry your skin.  Apply a moisturizing lotion or petroleum jelly (that does not contain alcohol and is unscented) to the skin on your feet and to dry, brittle toenails. Do not apply lotion between your toes.  Trim your toenails straight across. Do not dig under them or around the cuticle. File the edges of your nails with an emery board or nail file.  Do not cut corns or calluses or try to remove them with medicine.  Wear clean socks or stockings every day. Make sure they are not too tight. Do not wear knee-high stockings since they may decrease blood flow to your legs.  Wear shoes that fit properly and have enough cushioning. To break in new shoes, wear them for just a few hours a day. This prevents you from injuring your feet. Always look in your shoes before you put them on to be sure there are no objects inside.  Do not cross your legs. This may decrease the blood flow to your feet.  If you find a minor scrape, cut, or break in the skin on your feet, keep it and the  skin around it clean and dry. These areas may be cleansed with mild soap and water. Do not cleanse the area with peroxide, alcohol, or iodine.  When you remove an adhesive bandage, be sure not to damage the skin around it.  If you have a wound, look at it several times a day to make sure it is healing.  Do not use heating pads or hot water bottles. They may burn your skin. If you have lost feeling in your feet or legs, you may not know it is happening until it is too late.  Make sure your health care provider performs a complete foot exam at least annually or more often if you have foot problems. Report any cuts, sores, or bruises to your health care provider immediately. SEEK MEDICAL CARE IF:   You have an injury that is not healing.  You have cuts or breaks in the skin.  You have an ingrown nail.  You notice redness on your legs or feet.  You feel burning or tingling  in your legs or feet.  You have pain or cramps in your legs and feet.  Your legs or feet are numb.  Your feet always feel cold. SEEK IMMEDIATE MEDICAL CARE IF:   There is increasing redness, swelling, or pain in or around a wound.  There is a red line that goes up your leg.  Pus is coming from a wound.  You develop a fever or as directed by your health care provider.  You notice a bad smell coming from an ulcer or wound.   This information is not intended to replace advice given to you by your health care provider. Make sure you discuss any questions you have with your health care provider.   Document Released: 03/08/2000 Document Revised: 11/11/2012 Document Reviewed: 08/18/2012 Elsevier Interactive Patient Education [employer].

## 2015-11-21 NOTE — Progress Notes (Signed)
Subjective:     Patient ID: Jonathan Mcneil, male   DOB: January 09, 1956, 60 y.o.   MRN: [id number]  HPI 60 year old male presents the office today for concerns of a possible bunion to the right foot on the big toe. He states he gets some pain at times she moves his toe and it swells occasionally. She's been hurting of the last couple of days. He denies any recent injury or trauma. He relates an injury several years ago however not at this time. His last A1c was around 10. He's had no recent treatment for this. No other complaints.  Review of Systems  All other systems reviewed and are negative.      Objective:   Physical Exam General: AAO x3, NAD  Dermatological: Skin is warm, dry and supple bilateral. There are no open sores, no preulcerative lesions, no rash or signs of infection present.  Vascular: Dorsalis Pedis artery and Posterior Tibial artery pedal pulses are 2/4 bilateral with immedate capillary fill time. Pedal hair growth present. There is no pain with calf compression, swelling, warmth, erythema.   Neruologic:Sensation somewhat decreased with Derrel Nip monofilament  Musculoskeletal: Bony exostosis palpable off the dorsal right first metatarsophalangeal joint visit decreased range of motion of the joint. Crepitation is present with first MPJ range of motion is trace edema to the area any erythema or increase in warmth. There is no other areas of tenderness bilaterally.  Gait: Unassisted, Nonantalgic.      Assessment:     Hallux rigidus right first MTPJ, capsulitis    Plan:     -Treatment options discussed including all alternatives, risks, and complications -Etiology of symptoms were discussed -X-rays were obtained and reviewed with the patient. Arthritic changes are present the first metatarsal phalangeal joint. -At this time discussed steroid injection to the area and he wishes to proceed understanding risks and complications. Under sterile conditions a mixture of  dexamethasone and local anesthetic was infiltrated into the area of maximal tenderness without complications. Post injection care discussed. -Offloading pads dispensed. -Follow-up with symptoms continue or worsen. Call any questions or concerns to meantime.  Celesta Gentile, DPM

## 2015-12-06 DIAGNOSIS — E1165 Type 2 diabetes mellitus with hyperglycemia: Secondary | ICD-10-CM | POA: Diagnosis not present

## 2015-12-13 DIAGNOSIS — Z23 Encounter for immunization: Secondary | ICD-10-CM | POA: Diagnosis not present

## 2015-12-13 DIAGNOSIS — E1165 Type 2 diabetes mellitus with hyperglycemia: Secondary | ICD-10-CM | POA: Diagnosis not present

## 2015-12-13 DIAGNOSIS — E1021 Type 1 diabetes mellitus with diabetic nephropathy: Secondary | ICD-10-CM | POA: Diagnosis not present

## 2015-12-13 DIAGNOSIS — E782 Mixed hyperlipidemia: Secondary | ICD-10-CM | POA: Diagnosis not present

## 2016-01-04 ENCOUNTER — Emergency Department (HOSPITAL_COMMUNITY): Payer: Medicare Other

## 2016-01-04 ENCOUNTER — Emergency Department (HOSPITAL_COMMUNITY)
Admission: EM | Admit: 2016-01-04 | Discharge: 2016-01-04 | Disposition: A | Discharge status: Home / Self Care | Payer: Medicare Other | Attending: Emergency Medicine | Admitting: Emergency Medicine

## 2016-01-04 ENCOUNTER — Encounter (HOSPITAL_COMMUNITY): Payer: Self-pay | Admitting: Emergency Medicine

## 2016-01-04 DIAGNOSIS — I129 Hypertensive chronic kidney disease with stage 1 through stage 4 chronic kidney disease, or unspecified chronic kidney disease: Secondary | ICD-10-CM | POA: Diagnosis not present

## 2016-01-04 DIAGNOSIS — E782 Mixed hyperlipidemia: Secondary | ICD-10-CM | POA: Diagnosis not present

## 2016-01-04 DIAGNOSIS — Z794 Long term (current) use of insulin: Secondary | ICD-10-CM | POA: Insufficient documentation

## 2016-01-04 DIAGNOSIS — R079 Chest pain, unspecified: Secondary | ICD-10-CM | POA: Diagnosis not present

## 2016-01-04 DIAGNOSIS — N189 Chronic kidney disease, unspecified: Secondary | ICD-10-CM | POA: Insufficient documentation

## 2016-01-04 DIAGNOSIS — E1140 Type 2 diabetes mellitus with diabetic neuropathy, unspecified: Secondary | ICD-10-CM | POA: Insufficient documentation

## 2016-01-04 DIAGNOSIS — R1013 Epigastric pain: Secondary | ICD-10-CM | POA: Diagnosis not present

## 2016-01-04 DIAGNOSIS — R0789 Other chest pain: Secondary | ICD-10-CM | POA: Diagnosis not present

## 2016-01-04 DIAGNOSIS — E1122 Type 2 diabetes mellitus with diabetic chronic kidney disease: Secondary | ICD-10-CM | POA: Diagnosis not present

## 2016-01-04 DIAGNOSIS — R072 Precordial pain: Secondary | ICD-10-CM | POA: Diagnosis not present

## 2016-01-04 DIAGNOSIS — Z87891 Personal history of nicotine dependence: Secondary | ICD-10-CM | POA: Insufficient documentation

## 2016-01-04 LAB — CBC
HCT: 41.6 % (ref 39.0–52.0)
Hemoglobin: 14.5 g/dL (ref 13.0–17.0)
MCH: 29.2 pg (ref 26.0–34.0)
MCHC: 34.9 g/dL (ref 30.0–36.0)
MCV: 83.7 fL (ref 78.0–100.0)
PLATELETS: 280 10*3/uL (ref 150–400)
RBC: 4.97 MIL/uL (ref 4.22–5.81)
RDW: 14 % (ref 11.5–15.5)
WBC: 5.6 10*3/uL (ref 4.0–10.5)

## 2016-01-04 LAB — BASIC METABOLIC PANEL
Anion gap: 13 (ref 5–15)
BUN: 17 mg/dL (ref 6–20)
CALCIUM: 9.3 mg/dL (ref 8.9–10.3)
CO2: 21 mmol/L — ABNORMAL LOW (ref 22–32)
Chloride: 103 mmol/L (ref 101–111)
Creatinine, Ser: 1.84 mg/dL — ABNORMAL HIGH (ref 0.61–1.24)
GFR calc Af Amer: 45 mL/min — ABNORMAL LOW (ref >60)
GFR, EST NON AFRICAN AMERICAN: 38 mL/min — AB (ref >60)
GLUCOSE: 238 mg/dL — AB (ref 65–99)
Potassium: 3.1 mmol/L — ABNORMAL LOW (ref 3.5–5.1)
SODIUM: 137 mmol/L (ref 135–145)

## 2016-01-04 LAB — HEPATIC FUNCTION PANEL
ALBUMIN: 3.5 g/dL (ref 3.5–5.0)
ALK PHOS: 90 U/L (ref 38–126)
ALT: 19 U/L (ref 17–63)
AST: 21 U/L (ref 15–41)
BILIRUBIN DIRECT: 0.1 mg/dL (ref 0.1–0.5)
BILIRUBIN INDIRECT: 0.7 mg/dL (ref 0.3–0.9)
BILIRUBIN TOTAL: 0.8 mg/dL (ref 0.3–1.2)
Total Protein: 7.4 g/dL (ref 6.5–8.1)

## 2016-01-04 LAB — LIPASE, BLOOD: LIPASE: 25 U/L (ref 11–51)

## 2016-01-04 LAB — I-STAT TROPONIN, ED
TROPONIN I, POC: 0.02 ng/mL (ref 0.00–0.08)
Troponin i, poc: 0.01 ng/mL (ref 0.00–0.08)

## 2016-01-04 MED ORDER — GI COCKTAIL ~~LOC~~
30.0000 mL | Freq: Once | ORAL | Status: AC
  Administered 2016-01-04: 30 mL via ORAL
  Filled 2016-01-04: qty 30

## 2016-01-04 MED ORDER — POTASSIUM CHLORIDE CRYS ER 20 MEQ PO TBCR
20.0000 meq | EXTENDED_RELEASE_TABLET | Freq: Once | ORAL | Status: AC
  Administered 2016-01-04: 20 meq via ORAL
  Filled 2016-01-04: qty 1

## 2016-01-04 MED ORDER — OMEPRAZOLE 20 MG PO CPDR: 20.0000 mg | DELAYED_RELEASE_CAPSULE | Freq: Every day | ORAL | 0 refills | Status: DC

## 2016-01-04 NOTE — ED Notes (Signed)
RN called lab to add on lipase and hepatic function labs

## 2016-01-04 NOTE — ED Triage Notes (Signed)
Pt reports left sided chest pain with pain radiating into left arm and left abd for 3 days. Pt also reports intermittent sob with HA. Pt is warm and dry in no acute distress.

## 2016-01-04 NOTE — ED Provider Notes (Signed)
[facility] DEPT Provider Note   CSN: [id number] Arrival date & time: 01/04/16  1253     History   Chief Complaint Chief Complaint  Patient presents with  . Chest Pain    HPI Jonathan Mcneil is a 60 y.o. male.   Chest Pain   This is a new problem. The current episode started more than 2 days ago. The problem occurs constantly. The problem has not changed since onset.The pain is present in the epigastric region. The pain is moderate. The quality of the pain is described as burning. The pain radiates to the left arm. Associated symptoms include abdominal pain and shortness of breath (mild). Pertinent negatives include no back pain, no claudication, no cough, no diaphoresis, no fever, no nausea, no orthopnea, no palpitations, no PND and no vomiting.  Pertinent negatives for past medical history include no seizures.    Past Medical History:  Diagnosis Date  . CKD (chronic kidney disease)   . Diabetes mellitus without complication ([place])   . Hypertension   . Low back pain   . Pancreatitis   . Schizophrenia [facility])     Patient Active Problem List   Diagnosis Date Noted  . Acute encephalopathy 02/15/2015  . CKD (chronic kidney disease) 02/14/2015  . Acute kidney injury superimposed on chronic kidney disease ([place]) 02/14/2015  . Hyponatremia 02/14/2015  . Back pain 02/14/2015  . Neuropathy ([place]) 02/14/2015  . Obesity 02/14/2015  . Abdominal pain 02/14/2015  . Pancreatitis   . Diabetes mellitus without complication ([place])   . Pancreatitis, acute   . Schizo-affective psychosis ([place]) 04/28/2013    History reviewed. No pertinent surgical history.     Home Medications    Prior to Admission medications   Medication Sig Start Date End Date Taking? Authorizing Provider  amLODipine (NORVASC) 5 MG tablet Take 1 tablet (5 mg total) by mouth daily. 02/16/15  Yes Reyne Dumas, MD  chlorhexidine (PERIDEX) 0.12 % solution 1 mL by Mouth Rinse route 2 (two) times daily. 12/27/15   Yes Historical Provider, MD  EPINEPHrine 0.3 mg/0.3 mL IJ SOAJ injection Inject 0.3 mLs into the muscle once as needed for allergies. Allergic reaction 10/24/15  Yes Historical Provider, MD  HYDROcodone-acetaminophen (NORCO/VICODIN) 5-325 MG tablet Take 1 tablet by mouth every 6 (six) hours as needed for pain. 12/27/15  Yes Historical Provider, MD  insulin detemir (LEVEMIR) 100 UNIT/ML injection Inject 0.2 mLs (20 Units total) into the skin at bedtime. Patient taking differently: Inject 24-28 Units into the skin See admin instructions. 24 units in am 28 units in pm 02/16/15  Yes Reyne Dumas, MD  PRESCRIPTION MEDICATION Take 1 capsule by mouth daily. Study drug for diabetes. Dr. Ashby Dawes office   Yes Historical Provider, MD  Asenapine Maleate 10 MG SUBL Place 1 tablet (10 mg total) under the tongue at bedtime. Do not eat or drink for 30 minutes before or after taking medication Patient not taking: Reported on 01/04/2016 08/24/13   Norma Fredrickson, MD  diphenhydrAMINE (BENADRYL) 25 mg capsule Take 2 capsules (50 mg total) by mouth every 8 (eight) hours as needed for itching. Patient not taking: Reported on 01/04/2016 10/22/15   Margette Fast, MD  traMADol (ULTRAM) 50 MG tablet Take 1 tablet (50 mg total) by mouth every 12 (twelve) hours as needed for severe pain. Patient not taking: Reported on 01/04/2016 10/18/15   Everlene Balls, MD    Family History No family history on file.  Social History Social History  Substance Use Topics  .  Smoking status: Former Research scientist (life sciences)  . Smokeless tobacco: Never Used  . Alcohol use No     Allergies   Ibuprofen and Penicillins   Review of Systems Review of Systems  Constitutional: Negative for chills, diaphoresis and fever.  HENT: Negative for ear pain and sore throat.   Eyes: Negative for pain and visual disturbance.  Respiratory: Positive for shortness of breath (mild). Negative for cough.   Cardiovascular: Positive for chest pain. Negative for palpitations,  orthopnea, claudication and PND.  Gastrointestinal: Positive for abdominal pain. Negative for nausea and vomiting.  Genitourinary: Negative for dysuria and hematuria.  Musculoskeletal: Negative for arthralgias and back pain.  Skin: Negative for color change and rash.  Neurological: Negative for seizures and syncope.  All other systems reviewed and are negative.    Physical Exam Updated Vital Signs BP 131/93 (BP Location: Left Arm)   Pulse 94   Temp 98.4 F (36.9 C) (Oral)   Resp 16   Ht 5\' 9"  (1.753 m)   Wt 99.8 kg   SpO2 99%   BMI 32.49 kg/m   Physical Exam  Constitutional: He is oriented to person, place, and time. He appears well-developed and well-nourished.  HENT:  Head: Normocephalic and atraumatic.  Eyes: Conjunctivae and EOM are normal. Pupils are equal, round, and reactive to light.  Neck: Normal range of motion. Neck supple.  Cardiovascular: Normal rate and regular rhythm.   Pulmonary/Chest: Effort normal and breath sounds normal.  Abdominal: Soft. There is tenderness (epigastric).  Musculoskeletal: He exhibits no edema.  Neurological: He is alert and oriented to person, place, and time.  Skin: Skin is warm and dry.  Psychiatric: He has a normal mood and affect.  Nursing note and vitals reviewed.    ED Treatments / Results  Labs (all labs ordered are listed, but only abnormal results are displayed) Labs Reviewed  BASIC METABOLIC PANEL - Abnormal; Notable for the following:       Result Value   Potassium 3.1 (*)    CO2 21 (*)    Glucose, Bld 238 (*)    Creatinine, Ser 1.84 (*)    GFR calc non Af Amer 38 (*)    GFR calc Af Amer 45 (*)    All other components within normal limits  CBC  HEPATIC FUNCTION PANEL  LIPASE, BLOOD  I-STAT TROPOININ, ED  I-STAT TROPOININ, ED    EKG  EKG Interpretation  Date/Time:  Thursday January 04 2016 13:00:05 EDT Ventricular Rate:  91 PR Interval:  158 QRS Duration: 96 QT Interval:  380 QTC Calculation: 467 R  Axis:   9 Text Interpretation:  Normal sinus rhythm Normal ECG Normal ECG Confirmed by Carmin Muskrat  MD ([id number]) on 01/04/2016 3:15:33 PM       Radiology Dg Chest 2 View  Result Date: 01/04/2016 CLINICAL DATA:  Chest pain. EXAM: CHEST  2 VIEW COMPARISON:  Radiograph of February 11, 2012. FINDINGS: The heart size and mediastinal contours are within normal limits. Both lungs are clear. No pneumothorax or pleural effusion is noted. The visualized skeletal structures are unremarkable. IMPRESSION: No active cardiopulmonary disease. Electronically Signed   By: Marijo Conception, M.D.   On: 01/04/2016 13:40    Procedures Procedures (including critical care time)  Medications Ordered in ED Medications - No data to display   Initial Impression / Assessment and Plan / ED Course  I have reviewed the triage vital signs and the nursing notes.  Pertinent labs & imaging results that were available  during my care of the patient were reviewed by me and considered in my medical decision making (see chart for details).  Clinical Course   EKG demonstrates NSR. Labs ordered, including CBC, BMP, delta troponin. Results significant for hypokalemia, elevated glucose, baseline creatinine.  Chest xr obtained, personally reviewed by me, demonstrates no acute cardiac or pulmonary processes.  HEART score of 3.  Abdominal studies added, including hepatic panel and lipase, which are unremarkable.  Doubt pancreatitis, appendicitis.  Patient states he recently stopped taking zantac.  Likely gastritis.  Patient will be prescribed omeprazole course.  Patient is discharged with strict return precautions, follow up instructions, and educational materials.  Final Clinical Impressions(s) / ED Diagnoses   Final diagnoses:  Nonspecific chest pain  Epigastric pain    New Prescriptions New Prescriptions   No medications on file     Elveria Rising, MD 01/05/16 [id number]    Carmin Muskrat, MD 01/10/16 2210

## 2016-03-27 DIAGNOSIS — E1021 Type 1 diabetes mellitus with diabetic nephropathy: Secondary | ICD-10-CM | POA: Diagnosis not present

## 2016-03-27 DIAGNOSIS — E1165 Type 2 diabetes mellitus with hyperglycemia: Secondary | ICD-10-CM | POA: Diagnosis not present

## 2016-04-03 DIAGNOSIS — E1165 Type 2 diabetes mellitus with hyperglycemia: Secondary | ICD-10-CM | POA: Diagnosis not present

## 2016-04-03 DIAGNOSIS — N183 Chronic kidney disease, stage 3 (moderate): Secondary | ICD-10-CM | POA: Diagnosis not present

## 2016-04-03 DIAGNOSIS — E1021 Type 1 diabetes mellitus with diabetic nephropathy: Secondary | ICD-10-CM | POA: Diagnosis not present

## 2016-04-03 DIAGNOSIS — E782 Mixed hyperlipidemia: Secondary | ICD-10-CM | POA: Diagnosis not present

## 2016-05-15 DIAGNOSIS — N183 Chronic kidney disease, stage 3 (moderate): Secondary | ICD-10-CM | POA: Diagnosis not present

## 2016-05-22 DIAGNOSIS — E1021 Type 1 diabetes mellitus with diabetic nephropathy: Secondary | ICD-10-CM | POA: Diagnosis not present

## 2016-05-22 DIAGNOSIS — N183 Chronic kidney disease, stage 3 (moderate): Secondary | ICD-10-CM | POA: Diagnosis not present

## 2016-05-22 DIAGNOSIS — E1165 Type 2 diabetes mellitus with hyperglycemia: Secondary | ICD-10-CM | POA: Diagnosis not present

## 2016-06-25 DIAGNOSIS — E1021 Type 1 diabetes mellitus with diabetic nephropathy: Secondary | ICD-10-CM | POA: Diagnosis not present

## 2016-06-25 DIAGNOSIS — E1165 Type 2 diabetes mellitus with hyperglycemia: Secondary | ICD-10-CM | POA: Diagnosis not present

## 2016-06-25 DIAGNOSIS — N183 Chronic kidney disease, stage 3 (moderate): Secondary | ICD-10-CM | POA: Diagnosis not present

## 2016-07-02 DIAGNOSIS — I10 Essential (primary) hypertension: Secondary | ICD-10-CM | POA: Diagnosis not present

## 2016-07-02 DIAGNOSIS — E1165 Type 2 diabetes mellitus with hyperglycemia: Secondary | ICD-10-CM | POA: Diagnosis not present

## 2016-07-02 DIAGNOSIS — E782 Mixed hyperlipidemia: Secondary | ICD-10-CM | POA: Diagnosis not present

## 2016-07-02 DIAGNOSIS — E1021 Type 1 diabetes mellitus with diabetic nephropathy: Secondary | ICD-10-CM | POA: Diagnosis not present

## 2016-07-23 DIAGNOSIS — I129 Hypertensive chronic kidney disease with stage 1 through stage 4 chronic kidney disease, or unspecified chronic kidney disease: Secondary | ICD-10-CM | POA: Diagnosis not present

## 2016-07-23 DIAGNOSIS — N183 Chronic kidney disease, stage 3 (moderate): Secondary | ICD-10-CM | POA: Diagnosis not present

## 2016-07-23 DIAGNOSIS — E1165 Type 2 diabetes mellitus with hyperglycemia: Secondary | ICD-10-CM | POA: Diagnosis not present

## 2016-08-20 DIAGNOSIS — N183 Chronic kidney disease, stage 3 (moderate): Secondary | ICD-10-CM | POA: Diagnosis not present

## 2016-08-20 DIAGNOSIS — I129 Hypertensive chronic kidney disease with stage 1 through stage 4 chronic kidney disease, or unspecified chronic kidney disease: Secondary | ICD-10-CM | POA: Diagnosis not present

## 2016-08-20 DIAGNOSIS — E1165 Type 2 diabetes mellitus with hyperglycemia: Secondary | ICD-10-CM | POA: Diagnosis not present

## 2016-10-08 DIAGNOSIS — N183 Chronic kidney disease, stage 3 (moderate): Secondary | ICD-10-CM | POA: Diagnosis not present

## 2016-10-08 DIAGNOSIS — E1165 Type 2 diabetes mellitus with hyperglycemia: Secondary | ICD-10-CM | POA: Diagnosis not present

## 2016-10-15 DIAGNOSIS — E1165 Type 2 diabetes mellitus with hyperglycemia: Secondary | ICD-10-CM | POA: Diagnosis not present

## 2016-10-15 DIAGNOSIS — N183 Chronic kidney disease, stage 3 (moderate): Secondary | ICD-10-CM | POA: Diagnosis not present

## 2016-10-15 DIAGNOSIS — E1021 Type 1 diabetes mellitus with diabetic nephropathy: Secondary | ICD-10-CM | POA: Diagnosis not present

## 2016-10-15 DIAGNOSIS — I129 Hypertensive chronic kidney disease with stage 1 through stage 4 chronic kidney disease, or unspecified chronic kidney disease: Secondary | ICD-10-CM | POA: Diagnosis not present

## 2016-11-07 DIAGNOSIS — R3129 Other microscopic hematuria: Secondary | ICD-10-CM | POA: Diagnosis not present

## 2016-11-07 DIAGNOSIS — R1032 Left lower quadrant pain: Secondary | ICD-10-CM | POA: Diagnosis not present

## 2016-11-07 DIAGNOSIS — K5792 Diverticulitis of intestine, part unspecified, without perforation or abscess without bleeding: Secondary | ICD-10-CM | POA: Diagnosis not present

## 2016-11-07 DIAGNOSIS — R809 Proteinuria, unspecified: Secondary | ICD-10-CM | POA: Diagnosis not present

## 2016-11-07 DIAGNOSIS — R1013 Epigastric pain: Secondary | ICD-10-CM | POA: Diagnosis not present

## 2016-11-07 DIAGNOSIS — N183 Chronic kidney disease, stage 3 (moderate): Secondary | ICD-10-CM | POA: Diagnosis not present

## 2016-11-07 DIAGNOSIS — E1129 Type 2 diabetes mellitus with other diabetic kidney complication: Secondary | ICD-10-CM | POA: Diagnosis not present

## 2016-11-11 ENCOUNTER — Other Ambulatory Visit: Payer: Self-pay | Admitting: Nephrology

## 2016-11-11 DIAGNOSIS — N183 Chronic kidney disease, stage 3 unspecified: Secondary | ICD-10-CM

## 2016-11-20 ENCOUNTER — Ambulatory Visit
Admission: RE | Admit: 2016-11-20 | Discharge: 2016-11-20 | Disposition: A | Discharge status: Home / Self Care | Payer: Medicare Other | Source: Ambulatory Visit | Attending: Nephrology | Admitting: Nephrology

## 2016-11-20 DIAGNOSIS — N183 Chronic kidney disease, stage 3 unspecified: Secondary | ICD-10-CM

## 2016-12-03 DIAGNOSIS — Z0000 Encounter for general adult medical examination without abnormal findings: Secondary | ICD-10-CM | POA: Diagnosis not present

## 2016-12-03 DIAGNOSIS — N390 Urinary tract infection, site not specified: Secondary | ICD-10-CM | POA: Diagnosis not present

## 2016-12-03 DIAGNOSIS — I129 Hypertensive chronic kidney disease with stage 1 through stage 4 chronic kidney disease, or unspecified chronic kidney disease: Secondary | ICD-10-CM | POA: Diagnosis not present

## 2016-12-03 DIAGNOSIS — Z23 Encounter for immunization: Secondary | ICD-10-CM | POA: Diagnosis not present

## 2016-12-03 DIAGNOSIS — E1165 Type 2 diabetes mellitus with hyperglycemia: Secondary | ICD-10-CM | POA: Diagnosis not present

## 2016-12-03 DIAGNOSIS — E782 Mixed hyperlipidemia: Secondary | ICD-10-CM | POA: Diagnosis not present

## 2016-12-03 DIAGNOSIS — Z125 Encounter for screening for malignant neoplasm of prostate: Secondary | ICD-10-CM | POA: Diagnosis not present

## 2016-12-16 DIAGNOSIS — N183 Chronic kidney disease, stage 3 (moderate): Secondary | ICD-10-CM | POA: Diagnosis not present

## 2016-12-16 DIAGNOSIS — M150 Primary generalized (osteo)arthritis: Secondary | ICD-10-CM | POA: Diagnosis not present

## 2016-12-16 DIAGNOSIS — E1021 Type 1 diabetes mellitus with diabetic nephropathy: Secondary | ICD-10-CM | POA: Diagnosis not present

## 2016-12-16 DIAGNOSIS — E1165 Type 2 diabetes mellitus with hyperglycemia: Secondary | ICD-10-CM | POA: Diagnosis not present

## 2016-12-16 DIAGNOSIS — E782 Mixed hyperlipidemia: Secondary | ICD-10-CM | POA: Diagnosis not present

## 2017-01-28 DIAGNOSIS — R3129 Other microscopic hematuria: Secondary | ICD-10-CM | POA: Diagnosis not present

## 2017-01-28 DIAGNOSIS — E1129 Type 2 diabetes mellitus with other diabetic kidney complication: Secondary | ICD-10-CM | POA: Diagnosis not present

## 2017-01-28 DIAGNOSIS — N183 Chronic kidney disease, stage 3 (moderate): Secondary | ICD-10-CM | POA: Diagnosis not present

## 2017-01-28 DIAGNOSIS — R809 Proteinuria, unspecified: Secondary | ICD-10-CM | POA: Diagnosis not present

## 2017-01-29 DIAGNOSIS — N183 Chronic kidney disease, stage 3 (moderate): Secondary | ICD-10-CM | POA: Diagnosis not present

## 2017-01-29 DIAGNOSIS — E1165 Type 2 diabetes mellitus with hyperglycemia: Secondary | ICD-10-CM | POA: Diagnosis not present

## 2017-02-05 DIAGNOSIS — E782 Mixed hyperlipidemia: Secondary | ICD-10-CM | POA: Diagnosis not present

## 2017-02-05 DIAGNOSIS — E1165 Type 2 diabetes mellitus with hyperglycemia: Secondary | ICD-10-CM | POA: Diagnosis not present

## 2017-02-05 DIAGNOSIS — N183 Chronic kidney disease, stage 3 (moderate): Secondary | ICD-10-CM | POA: Diagnosis not present

## 2017-02-05 DIAGNOSIS — E1021 Type 1 diabetes mellitus with diabetic nephropathy: Secondary | ICD-10-CM | POA: Diagnosis not present

## 2017-05-14 DIAGNOSIS — E1165 Type 2 diabetes mellitus with hyperglycemia: Secondary | ICD-10-CM | POA: Diagnosis not present

## 2017-05-14 DIAGNOSIS — E1021 Type 1 diabetes mellitus with diabetic nephropathy: Secondary | ICD-10-CM | POA: Diagnosis not present

## 2017-05-14 DIAGNOSIS — N183 Chronic kidney disease, stage 3 (moderate): Secondary | ICD-10-CM | POA: Diagnosis not present

## 2017-05-21 DIAGNOSIS — I10 Essential (primary) hypertension: Secondary | ICD-10-CM | POA: Diagnosis not present

## 2017-05-21 DIAGNOSIS — N183 Chronic kidney disease, stage 3 (moderate): Secondary | ICD-10-CM | POA: Diagnosis not present

## 2017-05-21 DIAGNOSIS — E782 Mixed hyperlipidemia: Secondary | ICD-10-CM | POA: Diagnosis not present

## 2017-05-21 DIAGNOSIS — E1165 Type 2 diabetes mellitus with hyperglycemia: Secondary | ICD-10-CM | POA: Diagnosis not present

## 2017-05-21 DIAGNOSIS — E1021 Type 1 diabetes mellitus with diabetic nephropathy: Secondary | ICD-10-CM | POA: Diagnosis not present

## 2017-05-25 ENCOUNTER — Emergency Department (HOSPITAL_COMMUNITY): Payer: Medicare Other

## 2017-05-25 ENCOUNTER — Other Ambulatory Visit: Payer: Self-pay

## 2017-05-25 ENCOUNTER — Encounter (HOSPITAL_COMMUNITY): Payer: Self-pay | Admitting: *Deleted

## 2017-05-25 ENCOUNTER — Emergency Department (HOSPITAL_COMMUNITY)
Admission: EM | Admit: 2017-05-25 | Discharge: 2017-05-25 | Disposition: A | Discharge status: Home / Self Care | Payer: Medicare Other | Attending: Emergency Medicine | Admitting: Emergency Medicine

## 2017-05-25 DIAGNOSIS — E1122 Type 2 diabetes mellitus with diabetic chronic kidney disease: Secondary | ICD-10-CM | POA: Diagnosis not present

## 2017-05-25 DIAGNOSIS — I129 Hypertensive chronic kidney disease with stage 1 through stage 4 chronic kidney disease, or unspecified chronic kidney disease: Secondary | ICD-10-CM | POA: Diagnosis not present

## 2017-05-25 DIAGNOSIS — N189 Chronic kidney disease, unspecified: Secondary | ICD-10-CM | POA: Insufficient documentation

## 2017-05-25 DIAGNOSIS — Z794 Long term (current) use of insulin: Secondary | ICD-10-CM | POA: Diagnosis not present

## 2017-05-25 DIAGNOSIS — K859 Acute pancreatitis without necrosis or infection, unspecified: Secondary | ICD-10-CM | POA: Diagnosis not present

## 2017-05-25 DIAGNOSIS — Z79899 Other long term (current) drug therapy: Secondary | ICD-10-CM | POA: Diagnosis not present

## 2017-05-25 DIAGNOSIS — R109 Unspecified abdominal pain: Secondary | ICD-10-CM | POA: Diagnosis not present

## 2017-05-25 DIAGNOSIS — Z87891 Personal history of nicotine dependence: Secondary | ICD-10-CM | POA: Insufficient documentation

## 2017-05-25 LAB — COMPREHENSIVE METABOLIC PANEL
ALBUMIN: 3.3 g/dL — AB (ref 3.5–5.0)
ALT: 20 U/L (ref 17–63)
ANION GAP: 10 (ref 5–15)
AST: 18 U/L (ref 15–41)
Alkaline Phosphatase: 111 U/L (ref 38–126)
BUN: 26 mg/dL — ABNORMAL HIGH (ref 6–20)
CHLORIDE: 108 mmol/L (ref 101–111)
CO2: 19 mmol/L — AB (ref 22–32)
Calcium: 8.8 mg/dL — ABNORMAL LOW (ref 8.9–10.3)
Creatinine, Ser: 3.47 mg/dL — ABNORMAL HIGH (ref 0.61–1.24)
GFR calc Af Amer: 21 mL/min — ABNORMAL LOW (ref >60)
GFR calc non Af Amer: 18 mL/min — ABNORMAL LOW (ref >60)
GLUCOSE: 118 mg/dL — AB (ref 65–99)
POTASSIUM: 4.2 mmol/L (ref 3.5–5.1)
SODIUM: 137 mmol/L (ref 135–145)
Total Bilirubin: 0.6 mg/dL (ref 0.3–1.2)
Total Protein: 6.9 g/dL (ref 6.5–8.1)

## 2017-05-25 LAB — URINALYSIS, ROUTINE W REFLEX MICROSCOPIC
BACTERIA UA: NONE SEEN
Bilirubin Urine: NEGATIVE
Glucose, UA: 50 mg/dL — AB
HGB URINE DIPSTICK: NEGATIVE
Ketones, ur: NEGATIVE mg/dL
Leukocytes, UA: NEGATIVE
Nitrite: NEGATIVE
SPECIFIC GRAVITY, URINE: 1.011 (ref 1.005–1.030)
Squamous Epithelial / LPF: NONE SEEN
pH: 6 (ref 5.0–8.0)

## 2017-05-25 LAB — CBC
HEMATOCRIT: 37.1 % — AB (ref 39.0–52.0)
HEMOGLOBIN: 12.5 g/dL — AB (ref 13.0–17.0)
MCH: 30 pg (ref 26.0–34.0)
MCHC: 33.7 g/dL (ref 30.0–36.0)
MCV: 89 fL (ref 78.0–100.0)
Platelets: 283 10*3/uL (ref 150–400)
RBC: 4.17 MIL/uL — AB (ref 4.22–5.81)
RDW: 14.2 % (ref 11.5–15.5)
WBC: 5.5 10*3/uL (ref 4.0–10.5)

## 2017-05-25 LAB — LIPASE, BLOOD: LIPASE: 52 U/L — AB (ref 11–51)

## 2017-05-25 MED ORDER — SODIUM CHLORIDE 0.9 % IV BOLUS (SEPSIS)
1000.0000 mL | Freq: Once | INTRAVENOUS | Status: AC
Start: 2017-05-25 — End: 2017-05-25
  Administered 2017-05-25: 1000 mL via INTRAVENOUS

## 2017-05-25 MED ORDER — GI COCKTAIL ~~LOC~~
30.0000 mL | Freq: Once | ORAL | Status: AC
  Administered 2017-05-25: 30 mL via ORAL
  Filled 2017-05-25: qty 30

## 2017-05-25 MED ORDER — MORPHINE SULFATE (PF) 4 MG/ML IV SOLN
4.0000 mg | Freq: Once | INTRAVENOUS | Status: AC
  Administered 2017-05-25: 4 mg via INTRAVENOUS
  Filled 2017-05-25: qty 1

## 2017-05-25 MED ORDER — HYDROCODONE-ACETAMINOPHEN 5-325 MG PO TABS: 1.0000 | ORAL_TABLET | ORAL | 0 refills | Status: DC | PRN

## 2017-05-25 MED ORDER — FAMOTIDINE IN NACL 20-0.9 MG/50ML-% IV SOLN: 20.0000 mg | Freq: Once | INTRAVENOUS | Status: DC

## 2017-05-25 MED ORDER — ONDANSETRON HCL 4 MG/2ML IJ SOLN
4.0000 mg | Freq: Once | INTRAMUSCULAR | Status: AC
  Administered 2017-05-25: 4 mg via INTRAVENOUS
  Filled 2017-05-25: qty 2

## 2017-05-25 MED ORDER — ONDANSETRON 4 MG PO TBDP: 4.0000 mg | ORAL_TABLET | Freq: Three times a day (TID) | ORAL | 0 refills | Status: DC | PRN

## 2017-05-25 NOTE — Discharge Instructions (Signed)
Please follow clear liquid diet for the next 2-3 days Take Norco for pain as needed Take Zofran for nausea as needed Please make a follow up appointment with your doctor regarding your kidney function and your elevated blood pressure

## 2017-05-25 NOTE — ED Notes (Signed)
Approx 500 ml left of fluid bolus

## 2017-05-25 NOTE — ED Triage Notes (Signed)
Pt reports abd pain x a few days. Keeps him up at night. Pain more severe on left side. Has headache, bodyaches and nausea. No vomiting or diarrhea.

## 2017-05-25 NOTE — ED Provider Notes (Signed)
[facility] EMERGENCY DEPARTMENT Provider Note   CSN: [id number] Arrival date & time: 05/25/17  1340   History   Chief Complaint Chief Complaint  Patient presents with  . Abdominal Pain    HPI Jonathan Mcneil is a 62 y.o. male who presents with abdominal pain. PMH significant for hx of pancreatitis, CKD (baseline 1.8), IDDM, chronic back pain, HTN, schizophrenia.  He states that he has had abdominal pain for the past 3-4 days.  He went to see his primary doctor who advised him to get milk of magnesia and MiraLAX.  He did take these medicines and states the milk of magnesia help moved his bowels and he temporarily felt better but the pain returned.  MiraLAX made his pain worse.  The pain is primarily been over the left side of his abdomen but it seems to be switching sides at times and he felt a "knot" over the right side of his abdomen. The pain also radiates to the left side of his chest. The pain feels like a burning pain. He reports associated decreased appetite and nausea. He reports having a colonoscopy in the past and says that "I am good for 10 years".  He states that he also has a headache and numbness and tingling of his hands and feet which is chronic.  He denies prior abdominal surgeries. He denies fever, chills, SOB, vomiting, diarrhea, urinary symptoms. He states he has seen a kidney doctor but "they didn't do anything".  PCP: Dr. Ashby Dawes  HPI  Past Medical History:  Diagnosis Date  . CKD (chronic kidney disease)   . Diabetes mellitus without complication ([place])   . Hypertension   . Low back pain   . Pancreatitis   . Schizophrenia [facility])     Patient Active Problem List   Diagnosis Date Noted  . Acute encephalopathy 02/15/2015  . CKD (chronic kidney disease) 02/14/2015  . Acute kidney injury superimposed on chronic kidney disease ([place]) 02/14/2015  . Hyponatremia 02/14/2015  . Back pain 02/14/2015  . Neuropathy 02/14/2015  . Obesity 02/14/2015   . Abdominal pain 02/14/2015  . Pancreatitis   . Diabetes mellitus without complication ([place])   . Pancreatitis, acute   . Schizo-affective psychosis ([place]) 04/28/2013    History reviewed. No pertinent surgical history.    Home Medications    Prior to Admission medications   Medication Sig Start Date End Date Taking? Authorizing Provider  amLODipine (NORVASC) 5 MG tablet Take 1 tablet (5 mg total) by mouth daily. 02/16/15   Reyne Dumas, MD  Asenapine Maleate 10 MG SUBL Place 1 tablet (10 mg total) under the tongue at bedtime. Do not eat or drink for 30 minutes before or after taking medication Patient not taking: Reported on 01/04/2016 08/24/13   Norma Fredrickson, MD  chlorhexidine (PERIDEX) 0.12 % solution 1 mL by Mouth Rinse route 2 (two) times daily. 12/27/15   [provider]  diphenhydrAMINE (BENADRYL) 25 mg capsule Take 2 capsules (50 mg total) by mouth every 8 (eight) hours as needed for itching. Patient not taking: Reported on 01/04/2016 10/22/15   Long, Wonda Olds, MD  EPINEPHrine 0.3 mg/0.3 mL IJ SOAJ injection Inject 0.3 mLs into the muscle once as needed for allergies. Allergic reaction 10/24/15   [provider]  HYDROcodone-acetaminophen (NORCO/VICODIN) 5-325 MG tablet Take 1 tablet by mouth every 6 (six) hours as needed for pain. 12/27/15   [provider]  insulin detemir (LEVEMIR) 100 UNIT/ML injection Inject 0.2 mLs (20  Units total) into the skin at bedtime. Patient taking differently: Inject 24-28 Units into the skin See admin instructions. 24 units in am 28 units in pm 02/16/15   Reyne Dumas, MD  omeprazole (PRILOSEC) 20 MG capsule Take 1 capsule (20 mg total) by mouth daily. 01/04/16   Elveria Rising, MD  PRESCRIPTION MEDICATION Take 1 capsule by mouth daily. Study drug for diabetes. Dr. Ashby Dawes office    [provider]  traMADol (ULTRAM) 50 MG tablet Take 1 tablet (50 mg total) by mouth every 12 (twelve) hours as needed for severe  pain. Patient not taking: Reported on 01/04/2016 10/18/15   Everlene Balls, MD    Family History History reviewed. No pertinent family history.  Social History Social History   Tobacco Use  . Smoking status: Former Research scientist (life sciences)  . Smokeless tobacco: Never Used  Substance Use Topics  . Alcohol use: No  . Drug use: No     Allergies   Ibuprofen and Penicillins   Review of Systems Review of Systems  Constitutional: Positive for appetite change. Negative for chills and fever.  Respiratory: Negative for shortness of breath.   Cardiovascular: Positive for chest pain.  Gastrointestinal: Positive for abdominal pain and nausea. Negative for constipation, diarrhea and vomiting.  Genitourinary: Positive for frequency (chronic). Negative for dysuria.  Neurological: Positive for numbness (chronic) and headaches.  All other systems reviewed and are negative.    Physical Exam Updated Vital Signs BP (!) 157/102 (BP Location: Right Arm)   Pulse 79   Temp 98.6 F (37 C) (Oral)   Resp 18   SpO2 100%   Physical Exam  Constitutional: He is oriented to person, place, and time. He appears well-developed and well-nourished. No distress.  HENT:  Head: Normocephalic and atraumatic.  Eyes: Conjunctivae are normal. Pupils are equal, round, and reactive to light. Right eye exhibits no discharge. Left eye exhibits no discharge. No scleral icterus.  Neck: Normal range of motion.  Cardiovascular: Normal rate and regular rhythm.  Pulmonary/Chest: Effort normal and breath sounds normal. No respiratory distress.  Abdominal: Soft. Bowel sounds are normal. He exhibits no distension and no mass. There is tenderness (mild epigastric, LUQ, LLQ tenderness). There is no rebound and no guarding. No hernia.  Neurological: He is alert and oriented to person, place, and time.  Skin: Skin is warm and dry.  Psychiatric: He has a normal mood and affect. His behavior is normal.  Nursing note and vitals  reviewed.    ED Treatments / Results  Labs (all labs ordered are listed, but only abnormal results are displayed) Labs Reviewed  LIPASE, BLOOD - Abnormal; Notable for the following components:      Result Value   Lipase 52 (*)    All other components within normal limits  COMPREHENSIVE METABOLIC PANEL - Abnormal; Notable for the following components:   CO2 19 (*)    Glucose, Bld 118 (*)    BUN 26 (*)    Creatinine, Ser 3.47 (*)    Calcium 8.8 (*)    Albumin 3.3 (*)    GFR calc non Af Amer 18 (*)    GFR calc Af Amer 21 (*)    All other components within normal limits  CBC - Abnormal; Notable for the following components:   RBC 4.17 (*)    Hemoglobin 12.5 (*)    HCT 37.1 (*)    All other components within normal limits  URINALYSIS, ROUTINE W REFLEX MICROSCOPIC - Abnormal; Notable for the following  components:   Color, Urine STRAW (*)    Glucose, UA 50 (*)    Protein, ur >=300 (*)    All other components within normal limits    EKG  EKG Interpretation  Date/Time:  Sunday May 25 2017 17:33:05 EST Ventricular Rate:  72 PR Interval:    QRS Duration: 87 QT Interval:  387 QTC Calculation: 424 R Axis:   44 Text Interpretation:  Sinus rhythm No significant change since last tracing Confirmed by Lajean Saver [phone]) on 05/25/2017 6:11:26 PM       Radiology Ct Abdomen Pelvis Wo Contrast  Result Date: 05/25/2017 CLINICAL DATA:  Left-sided abdominal pain. EXAM: CT ABDOMEN AND PELVIS WITHOUT CONTRAST TECHNIQUE: Multidetector CT imaging of the abdomen and pelvis was performed following the standard protocol without IV contrast. COMPARISON:  CT abdomen pelvis 01/23/2009 FINDINGS: Lower chest: Normal heart size. Lung bases are clear. No pleural effusion. Hepatobiliary: The liver is normal in size and contour. Gallbladder is unremarkable. Pancreas: Unremarkable Spleen: Unremarkable Adrenals/Urinary Tract: The adrenal glands are normal. Kidneys are symmetric in size. No  hydronephrosis. Urinary bladder is unremarkable. Stomach/Bowel: Small hiatal hernia. Normal morphology of the stomach. Normal appendix. No evidence for bowel obstruction. No free fluid or free intraperitoneal air. Vascular/Lymphatic: Normal caliber abdominal aorta. Peripheral calcified atherosclerotic plaque. No retroperitoneal lymphadenopathy. Reproductive: Prostate unremarkable. Other: Fat containing left inguinal hernia. Musculoskeletal: No aggressive or acute appearing osseous lesions. Lumbar spine degenerative changes. IMPRESSION: 1. No acute process within the abdomen or pelvis. Electronically Signed   By: Lovey Newcomer M.D.   On: 05/25/2017 20:29    Procedures Procedures (including critical care time)  Medications Ordered in ED Medications  sodium chloride 0.9 % bolus 1,000 mL (0 mLs Intravenous Stopped 05/25/17 1946)  ondansetron (ZOFRAN) injection 4 mg (4 mg Intravenous Given 05/25/17 1739)  gi cocktail (Maalox,Lidocaine,Donnatal) (30 mLs Oral Given 05/25/17 1739)  morphine 4 MG/ML injection 4 mg (4 mg Intravenous Given 05/25/17 2036)     Initial Impression / Assessment and Plan / ED Course  I have reviewed the triage vital signs and the nursing notes.  Pertinent labs & imaging results that were available during my care of the patient were reviewed by me and considered in my medical decision making (see chart for details).  34 he has a history of pancreatitis but pain is atypical-year-old male presents with abdominal pain.  Since he is tender in the left lower quadrant.  Blood pressure is elevated but otherwise vital signs are normal.  Abdomen is minimally tender.  CBC is remarkable for mild anemia.  CMP is remarkable for worsening kidney function which is likely chronic.  He states he has had to see nephrology for his kidneys in the past.  Lipase is mildly elevated at 52.  Urine shows over 300 protein in the urine and 50 glucose.  Will obtain noncontrast CT of abdomen and pelvis.  Fluids, Zofran,  GI cocktail ordered.  Nursing notified me if pain is not improved after GI cocktail.  Morphine was ordered.  CT is negative.  Pain is greatly improved after morphine.  He was advised to follow a clear liquid diet for the next 2-3 days.  He was given a small amount of pain in nausea medicine.  He is advised to call his doctor tomorrow for a hospital follow-up visit.  Return precautions were given.  Final Clinical Impressions(s) / ED Diagnoses   Final diagnoses:  Acute pancreatitis, unspecified complication status, unspecified pancreatitis type  Chronic kidney disease, unspecified  CKD stage    ED Discharge Orders    None       Iris Pert 05/25/17 2223    Lajean Saver, MD 05/25/17 2350

## 2017-05-25 NOTE — ED Notes (Signed)
Patient transported to CT 

## 2017-05-29 DIAGNOSIS — E782 Mixed hyperlipidemia: Secondary | ICD-10-CM | POA: Diagnosis not present

## 2017-05-29 DIAGNOSIS — K859 Acute pancreatitis without necrosis or infection, unspecified: Secondary | ICD-10-CM | POA: Diagnosis not present

## 2017-05-29 DIAGNOSIS — E1165 Type 2 diabetes mellitus with hyperglycemia: Secondary | ICD-10-CM | POA: Diagnosis not present

## 2017-06-03 DIAGNOSIS — N2581 Secondary hyperparathyroidism of renal origin: Secondary | ICD-10-CM | POA: Diagnosis not present

## 2017-06-03 DIAGNOSIS — N183 Chronic kidney disease, stage 3 (moderate): Secondary | ICD-10-CM | POA: Diagnosis not present

## 2017-06-03 DIAGNOSIS — R809 Proteinuria, unspecified: Secondary | ICD-10-CM | POA: Diagnosis not present

## 2017-06-03 DIAGNOSIS — E1122 Type 2 diabetes mellitus with diabetic chronic kidney disease: Secondary | ICD-10-CM | POA: Diagnosis not present

## 2017-06-03 DIAGNOSIS — N184 Chronic kidney disease, stage 4 (severe): Secondary | ICD-10-CM | POA: Diagnosis not present

## 2017-06-03 DIAGNOSIS — R3129 Other microscopic hematuria: Secondary | ICD-10-CM | POA: Diagnosis not present

## 2017-09-10 DIAGNOSIS — E1021 Type 1 diabetes mellitus with diabetic nephropathy: Secondary | ICD-10-CM | POA: Diagnosis not present

## 2017-09-10 DIAGNOSIS — N183 Chronic kidney disease, stage 3 (moderate): Secondary | ICD-10-CM | POA: Diagnosis not present

## 2017-09-10 DIAGNOSIS — E1165 Type 2 diabetes mellitus with hyperglycemia: Secondary | ICD-10-CM | POA: Diagnosis not present

## 2017-09-11 DIAGNOSIS — I10 Essential (primary) hypertension: Secondary | ICD-10-CM | POA: Diagnosis not present

## 2017-09-22 DIAGNOSIS — N184 Chronic kidney disease, stage 4 (severe): Secondary | ICD-10-CM | POA: Diagnosis not present

## 2017-09-22 DIAGNOSIS — E1122 Type 2 diabetes mellitus with diabetic chronic kidney disease: Secondary | ICD-10-CM | POA: Diagnosis not present

## 2017-09-22 DIAGNOSIS — R809 Proteinuria, unspecified: Secondary | ICD-10-CM | POA: Diagnosis not present

## 2017-09-22 DIAGNOSIS — N2581 Secondary hyperparathyroidism of renal origin: Secondary | ICD-10-CM | POA: Diagnosis not present

## 2017-09-22 DIAGNOSIS — R3129 Other microscopic hematuria: Secondary | ICD-10-CM | POA: Diagnosis not present

## 2017-09-29 DIAGNOSIS — N183 Chronic kidney disease, stage 3 (moderate): Secondary | ICD-10-CM | POA: Diagnosis not present

## 2017-09-29 DIAGNOSIS — E782 Mixed hyperlipidemia: Secondary | ICD-10-CM | POA: Diagnosis not present

## 2017-09-29 DIAGNOSIS — E1165 Type 2 diabetes mellitus with hyperglycemia: Secondary | ICD-10-CM | POA: Diagnosis not present

## 2017-09-29 DIAGNOSIS — I10 Essential (primary) hypertension: Secondary | ICD-10-CM | POA: Diagnosis not present

## 2017-10-02 ENCOUNTER — Other Ambulatory Visit (HOSPITAL_COMMUNITY): Payer: Self-pay

## 2017-10-02 ENCOUNTER — Other Ambulatory Visit: Payer: Self-pay

## 2017-10-02 ENCOUNTER — Encounter: Payer: Medicare Other | Admitting: Vascular Surgery

## 2017-10-02 ENCOUNTER — Encounter (HOSPITAL_COMMUNITY): Payer: Self-pay

## 2017-10-02 DIAGNOSIS — N185 Chronic kidney disease, stage 5: Secondary | ICD-10-CM

## 2017-10-13 ENCOUNTER — Ambulatory Visit (HOSPITAL_COMMUNITY): Payer: Medicare Other

## 2017-10-13 ENCOUNTER — Ambulatory Visit (HOSPITAL_COMMUNITY): Payer: Medicare Other | Attending: Vascular Surgery

## 2017-10-29 ENCOUNTER — Encounter: Payer: Medicare Other | Admitting: Vascular Surgery

## 2017-10-30 ENCOUNTER — Telehealth: Payer: Self-pay | Admitting: Vascular Surgery

## 2017-10-30 NOTE — Telephone Encounter (Signed)
Pt refused appointments scheduled 10/13/2017 for dialysis access vein  Mapping, he told the receptionist that he wasn't going on dialysis.  He was directed to call [facility] to discuss.    His consult appointment was left on the schedule for 08/07, but he no showed the appointment.

## 2017-10-31 ENCOUNTER — Other Ambulatory Visit: Payer: Self-pay

## 2017-10-31 NOTE — Patient Outreach (Signed)
[facility]) Care Management  10/31/2017  Jonathan Mcneil 1955/11/10 [id number]   Medication Adherence call to [name] left a message for patient to call back patient is due on Irbesartan 150 mg. [name] is showing past due under [place].  [facility] Management Direct Dial [phone]  Fax [phone] [email or web address]

## 2017-11-17 ENCOUNTER — Emergency Department (HOSPITAL_COMMUNITY)
Admission: EM | Admit: 2017-11-17 | Discharge: 2017-11-17 | Disposition: A | Discharge status: Home / Self Care | Payer: Medicare Other | Attending: Emergency Medicine | Admitting: Emergency Medicine

## 2017-11-17 ENCOUNTER — Emergency Department (HOSPITAL_COMMUNITY): Payer: Medicare Other

## 2017-11-17 ENCOUNTER — Other Ambulatory Visit: Payer: Self-pay

## 2017-11-17 DIAGNOSIS — R109 Unspecified abdominal pain: Secondary | ICD-10-CM

## 2017-11-17 DIAGNOSIS — R1031 Right lower quadrant pain: Secondary | ICD-10-CM | POA: Diagnosis not present

## 2017-11-17 DIAGNOSIS — R195 Other fecal abnormalities: Secondary | ICD-10-CM | POA: Diagnosis not present

## 2017-11-17 DIAGNOSIS — R1011 Right upper quadrant pain: Secondary | ICD-10-CM | POA: Diagnosis not present

## 2017-11-17 DIAGNOSIS — R112 Nausea with vomiting, unspecified: Secondary | ICD-10-CM | POA: Diagnosis not present

## 2017-11-17 DIAGNOSIS — M549 Dorsalgia, unspecified: Secondary | ICD-10-CM | POA: Diagnosis not present

## 2017-11-17 LAB — COMPREHENSIVE METABOLIC PANEL
ALT: 16 U/L (ref 0–44)
AST: 19 U/L (ref 15–41)
Albumin: 3.1 g/dL — ABNORMAL LOW (ref 3.5–5.0)
Alkaline Phosphatase: 80 U/L (ref 38–126)
Anion gap: 7 (ref 5–15)
BUN: 63 mg/dL — ABNORMAL HIGH (ref 8–23)
CO2: 19 mmol/L — ABNORMAL LOW (ref 22–32)
Calcium: 8.2 mg/dL — ABNORMAL LOW (ref 8.9–10.3)
Chloride: 108 mmol/L (ref 98–111)
Creatinine, Ser: 5.79 mg/dL — ABNORMAL HIGH (ref 0.61–1.24)
GFR calc Af Amer: 11 mL/min — ABNORMAL LOW (ref >60)
GFR calc non Af Amer: 9 mL/min — ABNORMAL LOW (ref >60)
Glucose, Bld: 151 mg/dL — ABNORMAL HIGH (ref 70–99)
Potassium: 4.3 mmol/L (ref 3.5–5.1)
Sodium: 134 mmol/L — ABNORMAL LOW (ref 135–145)
Total Bilirubin: 0.7 mg/dL (ref 0.3–1.2)
Total Protein: 7.1 g/dL (ref 6.5–8.1)

## 2017-11-17 LAB — URINALYSIS, ROUTINE W REFLEX MICROSCOPIC
Bacteria, UA: NONE SEEN
Bilirubin Urine: NEGATIVE
GLUCOSE, UA: 50 mg/dL — AB
Ketones, ur: NEGATIVE mg/dL
Leukocytes, UA: NEGATIVE
Nitrite: NEGATIVE
PH: 6 (ref 5.0–8.0)
PROTEIN: 100 mg/dL — AB
Specific Gravity, Urine: 1.011 (ref 1.005–1.030)

## 2017-11-17 LAB — CBC
HEMATOCRIT: 35.1 % — AB (ref 39.0–52.0)
Hemoglobin: 11.3 g/dL — ABNORMAL LOW (ref 13.0–17.0)
MCH: 28.4 pg (ref 26.0–34.0)
MCHC: 32.2 g/dL (ref 30.0–36.0)
MCV: 88.2 fL (ref 78.0–100.0)
Platelets: 266 10*3/uL (ref 150–400)
RBC: 3.98 MIL/uL — AB (ref 4.22–5.81)
RDW: 13.6 % (ref 11.5–15.5)
WBC: 5.5 10*3/uL (ref 4.0–10.5)

## 2017-11-17 LAB — LIPASE, BLOOD: Lipase: 131 U/L — ABNORMAL HIGH (ref 11–51)

## 2017-11-17 MED ORDER — SODIUM CHLORIDE 0.9 % IV BOLUS
500.0000 mL | Freq: Once | INTRAVENOUS | Status: AC
  Administered 2017-11-17: 500 mL via INTRAVENOUS

## 2017-11-17 MED ORDER — DOCUSATE SODIUM 250 MG PO CAPS: 250.0000 mg | ORAL_CAPSULE | Freq: Every day | ORAL | 0 refills | Status: DC

## 2017-11-17 MED ORDER — METOCLOPRAMIDE HCL 5 MG/ML IJ SOLN
10.0000 mg | Freq: Once | INTRAMUSCULAR | Status: AC
  Administered 2017-11-17: 10 mg via INTRAVENOUS
  Filled 2017-11-17: qty 2

## 2017-11-17 MED ORDER — ACETAMINOPHEN 500 MG PO TABS: 500.0000 mg | ORAL_TABLET | Freq: Four times a day (QID) | ORAL | 0 refills | Status: DC | PRN

## 2017-11-17 MED ORDER — MORPHINE SULFATE (PF) 4 MG/ML IV SOLN
4.0000 mg | Freq: Once | INTRAVENOUS | Status: AC
  Administered 2017-11-17: 4 mg via INTRAVENOUS
  Filled 2017-11-17: qty 1

## 2017-11-17 MED ORDER — POLYETHYLENE GLYCOL 3350 17 G PO PACK: 17.0000 g | PACK | Freq: Every day | ORAL | 0 refills | Status: DC
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
## 2017-11-17 NOTE — Discharge Instructions (Addendum)
Take Tylenol every 6 hours as needed for your pain.  Alternate ice and heat alternating 20 minutes on, 20 minutes off.  Do this 3-4 times a day.  Attempt the back exercises and stretches as tolerated. For your constipation, take MiraLAX and Colace as prescribed. Please follow-up with your doctor and nephrologist for further management of your kidney disease and your back pain.  Please return to emergency department if you develop any new or worsening symptoms.

## 2017-11-17 NOTE — ED Triage Notes (Signed)
Patient arrives to room, crying. States "I think my kidneys are acting up, they feel hard" (as pt points to right flank).

## 2017-11-18 LAB — URINE CULTURE: Culture: NO GROWTH

## 2018-01-12 DIAGNOSIS — N183 Chronic kidney disease, stage 3 (moderate): Secondary | ICD-10-CM | POA: Diagnosis not present

## 2018-01-12 DIAGNOSIS — E1021 Type 1 diabetes mellitus with diabetic nephropathy: Secondary | ICD-10-CM | POA: Diagnosis not present

## 2018-01-12 DIAGNOSIS — E782 Mixed hyperlipidemia: Secondary | ICD-10-CM | POA: Diagnosis not present

## 2018-01-12 DIAGNOSIS — E1165 Type 2 diabetes mellitus with hyperglycemia: Secondary | ICD-10-CM | POA: Diagnosis not present

## 2018-01-14 DIAGNOSIS — I10 Essential (primary) hypertension: Secondary | ICD-10-CM | POA: Diagnosis not present

## 2018-01-14 DIAGNOSIS — N183 Chronic kidney disease, stage 3 (moderate): Secondary | ICD-10-CM | POA: Diagnosis not present

## 2018-01-14 DIAGNOSIS — E1165 Type 2 diabetes mellitus with hyperglycemia: Secondary | ICD-10-CM | POA: Diagnosis not present

## 2018-01-14 DIAGNOSIS — R252 Cramp and spasm: Secondary | ICD-10-CM | POA: Diagnosis not present

## 2018-01-14 DIAGNOSIS — R1110 Vomiting, unspecified: Secondary | ICD-10-CM | POA: Diagnosis not present

## 2018-01-15 ENCOUNTER — Encounter (HOSPITAL_COMMUNITY): Payer: Self-pay

## 2018-01-15 ENCOUNTER — Emergency Department (HOSPITAL_COMMUNITY): Payer: Medicare Other

## 2018-01-15 ENCOUNTER — Other Ambulatory Visit: Payer: Self-pay

## 2018-01-15 ENCOUNTER — Ambulatory Visit (HOSPITAL_COMMUNITY): Payer: Medicare Other

## 2018-01-15 ENCOUNTER — Observation Stay (HOSPITAL_COMMUNITY)
Admission: EM | Admit: 2018-01-15 | Discharge: 2018-01-16 | Disposition: A | Discharge status: Home / Self Care | Payer: Medicare Other | Attending: Internal Medicine | Admitting: Internal Medicine

## 2018-01-15 DIAGNOSIS — Z886 Allergy status to analgesic agent status: Secondary | ICD-10-CM | POA: Insufficient documentation

## 2018-01-15 DIAGNOSIS — E1122 Type 2 diabetes mellitus with diabetic chronic kidney disease: Secondary | ICD-10-CM | POA: Diagnosis not present

## 2018-01-15 DIAGNOSIS — F259 Schizoaffective disorder, unspecified: Secondary | ICD-10-CM | POA: Insufficient documentation

## 2018-01-15 DIAGNOSIS — E872 Acidosis, unspecified: Secondary | ICD-10-CM | POA: Diagnosis present

## 2018-01-15 DIAGNOSIS — R1084 Generalized abdominal pain: Secondary | ICD-10-CM

## 2018-01-15 DIAGNOSIS — I708 Atherosclerosis of other arteries: Secondary | ICD-10-CM | POA: Insufficient documentation

## 2018-01-15 DIAGNOSIS — I120 Hypertensive chronic kidney disease with stage 5 chronic kidney disease or end stage renal disease: Secondary | ICD-10-CM | POA: Diagnosis not present

## 2018-01-15 DIAGNOSIS — R1010 Upper abdominal pain, unspecified: Secondary | ICD-10-CM | POA: Diagnosis not present

## 2018-01-15 DIAGNOSIS — Z79899 Other long term (current) drug therapy: Secondary | ICD-10-CM | POA: Diagnosis not present

## 2018-01-15 DIAGNOSIS — Z87891 Personal history of nicotine dependence: Secondary | ICD-10-CM | POA: Insufficient documentation

## 2018-01-15 DIAGNOSIS — N2581 Secondary hyperparathyroidism of renal origin: Secondary | ICD-10-CM | POA: Insufficient documentation

## 2018-01-15 DIAGNOSIS — N19 Unspecified kidney failure: Secondary | ICD-10-CM

## 2018-01-15 DIAGNOSIS — D631 Anemia in chronic kidney disease: Secondary | ICD-10-CM | POA: Insufficient documentation

## 2018-01-15 DIAGNOSIS — R112 Nausea with vomiting, unspecified: Secondary | ICD-10-CM | POA: Diagnosis not present

## 2018-01-15 DIAGNOSIS — K859 Acute pancreatitis without necrosis or infection, unspecified: Secondary | ICD-10-CM | POA: Diagnosis present

## 2018-01-15 DIAGNOSIS — Z794 Long term (current) use of insulin: Secondary | ICD-10-CM | POA: Diagnosis not present

## 2018-01-15 DIAGNOSIS — N189 Chronic kidney disease, unspecified: Secondary | ICD-10-CM | POA: Diagnosis present

## 2018-01-15 DIAGNOSIS — J479 Bronchiectasis, uncomplicated: Secondary | ICD-10-CM | POA: Diagnosis not present

## 2018-01-15 DIAGNOSIS — N179 Acute kidney failure, unspecified: Secondary | ICD-10-CM | POA: Diagnosis present

## 2018-01-15 DIAGNOSIS — K439 Ventral hernia without obstruction or gangrene: Secondary | ICD-10-CM | POA: Insufficient documentation

## 2018-01-15 DIAGNOSIS — Z9119 Patient's noncompliance with other medical treatment and regimen: Secondary | ICD-10-CM | POA: Insufficient documentation

## 2018-01-15 DIAGNOSIS — Z880 Allergy status to penicillin: Secondary | ICD-10-CM | POA: Insufficient documentation

## 2018-01-15 DIAGNOSIS — N185 Chronic kidney disease, stage 5: Secondary | ICD-10-CM | POA: Diagnosis not present

## 2018-01-15 DIAGNOSIS — I10 Essential (primary) hypertension: Secondary | ICD-10-CM | POA: Diagnosis not present

## 2018-01-15 DIAGNOSIS — R7989 Other specified abnormal findings of blood chemistry: Secondary | ICD-10-CM | POA: Diagnosis not present

## 2018-01-15 DIAGNOSIS — E871 Hypo-osmolality and hyponatremia: Secondary | ICD-10-CM | POA: Insufficient documentation

## 2018-01-15 DIAGNOSIS — Z992 Dependence on renal dialysis: Secondary | ICD-10-CM | POA: Diagnosis not present

## 2018-01-15 DIAGNOSIS — N184 Chronic kidney disease, stage 4 (severe): Secondary | ICD-10-CM

## 2018-01-15 DIAGNOSIS — I160 Hypertensive urgency: Secondary | ICD-10-CM | POA: Diagnosis not present

## 2018-01-15 DIAGNOSIS — E119 Type 2 diabetes mellitus without complications: Secondary | ICD-10-CM | POA: Diagnosis not present

## 2018-01-15 DIAGNOSIS — R109 Unspecified abdominal pain: Secondary | ICD-10-CM | POA: Diagnosis present

## 2018-01-15 HISTORY — DX: Acidosis, unspecified: E87.20

## 2018-01-15 HISTORY — DX: Acidosis: E87.2

## 2018-01-15 LAB — CREATININE, SERUM
Creatinine, Ser: 8.01 mg/dL — ABNORMAL HIGH (ref 0.61–1.24)
GFR, EST AFRICAN AMERICAN: 7 mL/min — AB (ref >60)
GFR, EST NON AFRICAN AMERICAN: 6 mL/min — AB (ref >60)

## 2018-01-15 LAB — COMPREHENSIVE METABOLIC PANEL
ALT: 23 U/L (ref 0–44)
AST: 19 U/L (ref 15–41)
Albumin: 3.2 g/dL — ABNORMAL LOW (ref 3.5–5.0)
Alkaline Phosphatase: 95 U/L (ref 38–126)
Anion gap: 12 (ref 5–15)
BUN: 78 mg/dL — ABNORMAL HIGH (ref 8–23)
CO2: 15 mmol/L — ABNORMAL LOW (ref 22–32)
CREATININE: 8.15 mg/dL — AB (ref 0.61–1.24)
Calcium: 8 mg/dL — ABNORMAL LOW (ref 8.9–10.3)
Chloride: 110 mmol/L (ref 98–111)
GFR, EST AFRICAN AMERICAN: 7 mL/min — AB (ref >60)
GFR, EST NON AFRICAN AMERICAN: 6 mL/min — AB (ref >60)
Glucose, Bld: 135 mg/dL — ABNORMAL HIGH (ref 70–99)
Potassium: 4.1 mmol/L (ref 3.5–5.1)
Sodium: 137 mmol/L (ref 135–145)
Total Bilirubin: 0.6 mg/dL (ref 0.3–1.2)
Total Protein: 7.4 g/dL (ref 6.5–8.1)

## 2018-01-15 LAB — URINALYSIS, ROUTINE W REFLEX MICROSCOPIC
BILIRUBIN URINE: NEGATIVE
GLUCOSE, UA: 50 mg/dL — AB
KETONES UR: NEGATIVE mg/dL
LEUKOCYTES UA: NEGATIVE
NITRITE: NEGATIVE
PH: 6 (ref 5.0–8.0)
Specific Gravity, Urine: 1.009 (ref 1.005–1.030)

## 2018-01-15 LAB — HEMOGLOBIN A1C
HEMOGLOBIN A1C: 6.6 % — AB (ref 4.8–5.6)
Mean Plasma Glucose: 142.72 mg/dL

## 2018-01-15 LAB — GLUCOSE, CAPILLARY
GLUCOSE-CAPILLARY: 169 mg/dL — AB (ref 70–99)
GLUCOSE-CAPILLARY: 103 mg/dL — AB (ref 70–99)

## 2018-01-15 LAB — CBC
HEMATOCRIT: 34.8 % — AB (ref 39.0–52.0)
Hemoglobin: 11.1 g/dL — ABNORMAL LOW (ref 13.0–17.0)
MCH: 28 pg (ref 26.0–34.0)
MCHC: 31.9 g/dL (ref 30.0–36.0)
MCV: 87.7 fL (ref 80.0–100.0)
NRBC: 0 % (ref 0.0–0.2)
PLATELETS: 293 10*3/uL (ref 150–400)
RBC: 3.97 MIL/uL — ABNORMAL LOW (ref 4.22–5.81)
RDW: 15.2 % (ref 11.5–15.5)
WBC: 6 10*3/uL (ref 4.0–10.5)

## 2018-01-15 LAB — LIPASE, BLOOD: LIPASE: 138 U/L — AB (ref 11–51)

## 2018-01-15 MED ORDER — HEPARIN SODIUM (PORCINE) 5000 UNIT/ML IJ SOLN
5000.0000 [IU] | Freq: Three times a day (TID) | INTRAMUSCULAR | Status: DC
  Administered 2018-01-15 – 2018-01-16 (×4): 5000 [IU] via SUBCUTANEOUS
  Filled 2018-01-15 (×4): qty 1

## 2018-01-15 MED ORDER — INSULIN ASPART 100 UNIT/ML ~~LOC~~ SOLN
0.0000 [IU] | Freq: Three times a day (TID) | SUBCUTANEOUS | Status: DC
  Administered 2018-01-15 – 2018-01-16 (×2): 2 [IU] via SUBCUTANEOUS
  Filled 2018-01-15: qty 1

## 2018-01-15 MED ORDER — ONDANSETRON HCL 4 MG PO TABS
4.0000 mg | ORAL_TABLET | Freq: Four times a day (QID) | ORAL | Status: DC | PRN
  Administered 2018-01-16: 4 mg via ORAL
  Filled 2018-01-15: qty 1

## 2018-01-15 MED ORDER — DOCUSATE SODIUM 50 MG PO CAPS: 250.0000 mg | ORAL_CAPSULE | Freq: Every day | ORAL | Status: DC

## 2018-01-15 MED ORDER — MORPHINE SULFATE (PF) 2 MG/ML IV SOLN
2.0000 mg | Freq: Once | INTRAVENOUS | Status: AC
  Administered 2018-01-15: 2 mg via INTRAVENOUS
  Filled 2018-01-15: qty 1

## 2018-01-15 MED ORDER — STERILE WATER FOR INJECTION IV SOLN
INTRAVENOUS | Status: DC
  Administered 2018-01-15 – 2018-01-16 (×2): via INTRAVENOUS
  Filled 2018-01-15 (×3): qty 850

## 2018-01-15 MED ORDER — ONDANSETRON HCL 4 MG/2ML IJ SOLN: 4.0000 mg | Freq: Four times a day (QID) | INTRAMUSCULAR | Status: DC | PRN

## 2018-01-15 MED ORDER — POLYETHYLENE GLYCOL 3350 17 G PO PACK: 17.0000 g | PACK | Freq: Every day | ORAL | Status: DC | PRN

## 2018-01-15 MED ORDER — ONDANSETRON HCL 4 MG/2ML IJ SOLN
4.0000 mg | Freq: Once | INTRAMUSCULAR | Status: AC
  Administered 2018-01-15: 4 mg via INTRAVENOUS
  Filled 2018-01-15: qty 2

## 2018-01-15 MED ORDER — ACETAMINOPHEN 500 MG PO TABS: 500.0000 mg | ORAL_TABLET | Freq: Four times a day (QID) | ORAL | Status: DC | PRN

## 2018-01-15 MED ORDER — SODIUM CHLORIDE 0.9 % IV SOLN: INTRAVENOUS | Status: DC

## 2018-01-15 MED ORDER — AMLODIPINE BESYLATE 10 MG PO TABS
10.0000 mg | ORAL_TABLET | Freq: Every day | ORAL | Status: DC
  Administered 2018-01-15 – 2018-01-16 (×2): 10 mg via ORAL
  Filled 2018-01-15 (×2): qty 1
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
## 2018-01-15 NOTE — ED Triage Notes (Signed)
Pt states that he is having R sided flank pain and abd pain, went and saw his PCP yesterday and told he had kidney failure and a kidney stone? and would need to start dialysis. Having generalized body aches for the past several days

## 2018-01-15 NOTE — Consult Note (Addendum)
[facility] Nephrology Consultation Note  Requesting MD: Dr. Lonny Prude, S Reason for consult: CKD  HPI: Jonathan Mcneil is a 62 y.o. male with history of long-standing hypertension, diabetes mellitus with neuropathy, chronic pancreatitis, schizoaffective disorder, presented to the ER with nausea, progressively worsening abdominal pain for 2 days.  Patient reported that he had regular labs done by his PCP on Monday.  He was fasting whole day for the lab test.  The next day he started feeling nausea and diffuse abdominal pain with decreased oral intake.  He received call from his PCP for abnormal result related with worsening kidney function.  Today the symptoms was not getting better therefore he decided to come to the ER.  Patient has a history of CKD stage IV and follows with Dr. Florene Glen at [facility].  He was last seen on 09/22/2017 when his serum creatinine level was 5.3.  There was discussion about preparation for dialysis including referral to vascular surgery.  Patient denied use of NSAIDs or over-the-counter medication.  He denied any change in his urination.  Denied headache, dizziness, chest pain, shortness of breath, dysuria, urgency, weakness,  itching, poor sleep, diarrhea or constipation.  His abdomen pain is gradually improved in the ER after medication.  He was found to have elevated lipase level of 138.  In ER, blood pressure 176/104, and room air, labs with serum potassium level of 4.1, CO2 15, creatinine 8.15, BUN 78.  He had urine output of 800 cc in ER.  He had serum creatinine level of 3.47 in 05/2017, 5.79 and 10/2017 and now worsen to 8.15.  Creatinine, Ser  Date/Time Value Ref Range Status  01/15/2018 06:29 AM 8.15 (H) 0.61 - 1.24 mg/dL Final  11/17/2017 06:54 AM 5.79 (H) 0.61 - 1.24 mg/dL Final  05/25/2017 02:48 PM 3.47 (H) 0.61 - 1.24 mg/dL Final  01/04/2016 01:21 PM 1.84 (H) 0.61 - 1.24 mg/dL Final  02/16/2015 02:11 AM 1.77 (H) 0.61 - 1.24  mg/dL Final  02/15/2015 02:55 AM 1.82 (H) 0.61 - 1.24 mg/dL Final  02/14/2015 12:54 PM 2.22 (H) 0.61 - 1.24 mg/dL Final  10/10/2014 03:41 PM 1.76 (H) 0.61 - 1.24 mg/dL Final  02/11/2012 12:37 PM 1.48 (H) 0.50 - 1.35 mg/dL Final  01/23/2009 04:26 PM 1.4 0.4 - 1.5 mg/dL Final  04/26/2008 02:16 PM 1.3 0.4 - 1.5 mg/dL Final  09/23/2007 12:45 PM 1.38  Final  03/31/2007 08:21 PM 1.33 0.40 - 1.50 mg/dL Final  11/03/2006 08:54 PM 1.35 0.40 - 1.50 mg/dL Final  09/17/2006 01:40 AM 1.43  Final     PMHx:   Past Medical History:  Diagnosis Date  . CKD (chronic kidney disease)   . Diabetes mellitus without complication ([place])   . Hypertension   . Low back pain   . Pancreatitis   . Schizophrenia ([place])     History reviewed. No pertinent surgical history.  Surgical history reviewed,  Family Hx: Family history reviewed, his mother was on dialysis before she passed away.  Social History:  reports that he has quit smoking. He has never used smokeless tobacco. He reports that he does not drink alcohol or use drugs.  Allergies:  Allergies  Allergen Reactions  . Ibuprofen Other (See Comments)    Pt states he was told not to take it.  Marland Kitchen Penicillins Itching    Has patient had a PCN reaction causing immediate rash, facial/tongue/throat swelling, SOB or lightheadedness with hypotension:  Has patient had a PCN reaction causing severe rash  involving mucus membranes or skin necrosis:  Has patient had a PCN reaction that required hospitalization Has patient had a PCN reaction occurring within the last 10 years:  If all of the above answers are "NO", then may proceed with Cephalosporin use.    Medications: Prior to Admission medications   Medication Sig Start Date End Date Taking? Authorizing Provider  acetaminophen (TYLENOL) 500 MG tablet Take 1 tablet (500 mg total) by mouth every 6 (six) hours as needed. 11/17/17  Yes Law, Alexandra M, PA-C  amLODipine (NORVASC) 10 MG tablet Take 10 mg by mouth  daily. 09/24/17  Yes [provider]  cyclobenzaprine (FLEXERIL) 5 MG tablet Take 5 mg by mouth 2 (two) times daily. 01/14/18  Yes [provider]  amLODipine (NORVASC) 5 MG tablet Take 1 tablet (5 mg total) by mouth daily. Patient not taking: Reported on 11/17/2017 02/16/15   Reyne Dumas, MD  docusate sodium (COLACE) 250 MG capsule Take 1 capsule (250 mg total) by mouth daily. Patient not taking: Reported on 01/15/2018 11/17/17   Frederica Kuster, PA-C  HYDROcodone-acetaminophen (NORCO/VICODIN) 5-325 MG tablet Take 1 tablet by mouth every 4 (four) hours as needed. Patient not taking: Reported on 11/17/2017 05/25/17   Recardo Evangelist, PA-C  insulin detemir (LEVEMIR) 100 UNIT/ML injection Inject 0.2 mLs (20 Units total) into the skin at bedtime. Patient not taking: Reported on 11/17/2017 02/16/15   Reyne Dumas, MD  insulin glargine (LANTUS) 100 UNIT/ML injection Inject 10 Units into the skin 2 (two) times daily.    [provider]  omeprazole (PRILOSEC) 20 MG capsule Take 1 capsule (20 mg total) by mouth daily. Patient not taking: Reported on 11/17/2017 01/04/16   Elveria Rising, MD  ondansetron (ZOFRAN ODT) 4 MG disintegrating tablet Take 1 tablet (4 mg total) by mouth every 8 (eight) hours as needed for nausea or vomiting. Patient not taking: Reported on 11/17/2017 05/25/17   Recardo Evangelist, PA-C  polyethylene glycol [facility]) packet Take 17 g by mouth daily. 11/17/17   Frederica Kuster, PA-C    I have reviewed the patient's current medications.  Labs:  Results for orders placed or performed during the hospital encounter of 01/15/18 (from the past 48 hour(s))  Urinalysis, Routine w reflex microscopic     Status: Abnormal   Collection Time: 01/15/18  6:06 AM  Result Value Ref Range   Color, Urine STRAW (A) YELLOW   APPearance CLEAR CLEAR   Specific Gravity, Urine 1.009 1.005 - 1.030   pH 6.0 5.0 - 8.0   Glucose, UA 50 (A) NEGATIVE mg/dL   Hgb urine dipstick  SMALL (A) NEGATIVE   Bilirubin Urine NEGATIVE NEGATIVE   Ketones, ur NEGATIVE NEGATIVE mg/dL   Protein, ur >=300 (A) NEGATIVE mg/dL   Nitrite NEGATIVE NEGATIVE   Leukocytes, UA NEGATIVE NEGATIVE   RBC / HPF 0-5 0 - 5 RBC/hpf   WBC, UA 0-5 0 - 5 WBC/hpf   Bacteria, UA RARE (A) NONE SEEN   Squamous Epithelial / LPF 0-5 0 - 5    Comment: Performed at [facility], [address], [place], [place] [address]  Lipase, blood     Status: Abnormal   Collection Time: 01/15/18  6:29 AM  Result Value Ref Range   Lipase 138 (H) 11 - 51 U/L    Comment: Performed at [facility], [place] [address] [place], [place] [address]  Comprehensive metabolic panel     Status: Abnormal   Collection  Time: 01/15/18  6:29 AM  Result Value Ref Range   Sodium 137 135 - 145 mmol/L   Potassium 4.1 3.5 - 5.1 mmol/L   Chloride 110 98 - 111 mmol/L   CO2 15 (L) 22 - 32 mmol/L   Glucose, Bld 135 (H) 70 - 99 mg/dL   BUN 78 (H) 8 - 23 mg/dL   Creatinine, Ser 8.15 (H) 0.61 - 1.24 mg/dL   Calcium 8.0 (L) 8.9 - 10.3 mg/dL   Total Protein 7.4 6.5 - 8.1 g/dL   Albumin 3.2 (L) 3.5 - 5.0 g/dL   AST 19 15 - 41 U/L   ALT 23 0 - 44 U/L   Alkaline Phosphatase 95 38 - 126 U/L   Total Bilirubin 0.6 0.3 - 1.2 mg/dL   GFR calc non Af Amer 6 (L) >60 mL/min   GFR calc Af Amer 7 (L) >60 mL/min    Comment: (NOTE) The eGFR has been calculated using the CKD EPI equation. This calculation has not been validated in all clinical situations. eGFR's persistently <60 mL/min signify possible Chronic Kidney Disease.    Anion gap 12 5 - 15    Comment: Performed at [facility] [address], [place], [place] [address]  CBC     Status: Abnormal   Collection Time: 01/15/18  6:29 AM  Result Value Ref Range   WBC 6.0 4.0 - 10.5 K/uL   RBC 3.97 (L) 4.22 - 5.81 MIL/uL   Hemoglobin 11.1 (L) 13.0 - 17.0 g/dL   HCT 34.8 (L) 39.0 - 52.0 %   MCV 87.7 80.0 - 100.0 fL   MCH 28.0 26.0 - 34.0 pg   MCHC 31.9 30.0 - 36.0 g/dL   RDW  15.2 11.5 - 15.5 %   Platelets 293 150 - 400 K/uL   nRBC 0.0 0.0 - 0.2 %    Comment: Performed at [facility], [place] [address] [place], [place] [address]     ROS:  Pertinent items noted in HPI and remainder of comprehensive ROS otherwise negative.  Physical Exam: Vitals:   01/15/18 1215 01/15/18 1304  BP:  (!) 176/104  Pulse: 71 81  Resp: 13 16  Temp:    SpO2: 99% 100%     General exam: Appears calm and comfortable, able to lie flat on bed. Respiratory system: Clear to auscultation. Respiratory effort normal. No wheezing or crackle Cardiovascular system: S1 & S2 heard, RRR. No rub.  No pedal edema. Gastrointestinal system: Abdomen is nondistended, soft and nontender. Normal bowel sounds heard. Central nervous system: Alert and oriented. No focal neurological deficits. Extremities: Symmetric 5 x 5 power. Skin: No rashes, lesions or ulcers Psychiatry: Judgement and insight appear normal. Mood & affect appropriate.   Assessment/Plan:  # CKD stage 5, non-oliguric: Likely progressive CKD due to combination of diabetic nephropathy and hypertension.  Patient had serum creatinine level of 5.30 on 09/22/2017 and now progressed to 8.15.  Acute rise in creatinine could also be due to hemodynamically mediated in the setting of poor oral intake due to nausea and abdomen pain.  He follows with Dr. Florene Glen at [facility] and there was discussion about preparation for dialysis, last seen on 09/22/17. He is euvolumic on exam with acceptable potassium level and in room air.  He has no overt uremic symptoms and abdominal pain/nausea likely due to his pancreatitis. -Urinalysis with proteinuria due to diabetic nephropathy. -CT scan of abdomen pelvis with no hydronephrosis or calculi. -There is no urgent indication  for dialysis today.  I consulted Dr. Donzetta Matters  from [facility] for AV fistula creation.  Vein mapping ordered. -start sodium bicarbonate IV for 24 hours. -Monitor urine output, renal panel closely.  If no  improvement he may need dialysis via tunnel catheter.  #Hypertensive urgency: likely contributed by pain.  Resume amlodipine.  No edema.  Monitor blood pressure.  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  Hemoglobin 11.1 today.  #Metabolic bone disease: Check phosphorus level.  #Abdominal pain likely due to chronic pancreatitis.   I discussed above plan with the patient.  Thank you for the consult. We will follow with you.   Dron Tanna Furry 01/15/2018, 1:18 PM  [employer].
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
## 2018-01-16 ENCOUNTER — Encounter (HOSPITAL_COMMUNITY): Payer: Self-pay | Admitting: Internal Medicine

## 2018-01-16 ENCOUNTER — Ambulatory Visit (HOSPITAL_BASED_OUTPATIENT_CLINIC_OR_DEPARTMENT_OTHER): Payer: Medicare Other

## 2018-01-16 DIAGNOSIS — D631 Anemia in chronic kidney disease: Secondary | ICD-10-CM | POA: Diagnosis not present

## 2018-01-16 DIAGNOSIS — N189 Chronic kidney disease, unspecified: Secondary | ICD-10-CM

## 2018-01-16 DIAGNOSIS — N179 Acute kidney failure, unspecified: Principal | ICD-10-CM

## 2018-01-16 DIAGNOSIS — E872 Acidosis, unspecified: Secondary | ICD-10-CM | POA: Diagnosis present

## 2018-01-16 DIAGNOSIS — N185 Chronic kidney disease, stage 5: Secondary | ICD-10-CM | POA: Diagnosis not present

## 2018-01-16 DIAGNOSIS — E1122 Type 2 diabetes mellitus with diabetic chronic kidney disease: Secondary | ICD-10-CM | POA: Diagnosis not present

## 2018-01-16 DIAGNOSIS — I160 Hypertensive urgency: Secondary | ICD-10-CM | POA: Diagnosis not present

## 2018-01-16 DIAGNOSIS — I120 Hypertensive chronic kidney disease with stage 5 chronic kidney disease or end stage renal disease: Secondary | ICD-10-CM | POA: Diagnosis not present

## 2018-01-16 DIAGNOSIS — Z01810 Encounter for preprocedural cardiovascular examination: Secondary | ICD-10-CM | POA: Diagnosis not present

## 2018-01-16 LAB — IRON AND TIBC
IRON: 59 ug/dL (ref 45–182)
Saturation Ratios: 20 % (ref 17.9–39.5)
TIBC: 293 ug/dL (ref 250–450)
UIBC: 234 ug/dL

## 2018-01-16 LAB — CBC
HCT: 32.2 % — ABNORMAL LOW (ref 39.0–52.0)
HEMOGLOBIN: 10.5 g/dL — AB (ref 13.0–17.0)
MCH: 27.9 pg (ref 26.0–34.0)
MCHC: 32.6 g/dL (ref 30.0–36.0)
MCV: 85.6 fL (ref 80.0–100.0)
NRBC: 0 % (ref 0.0–0.2)
PLATELETS: 273 10*3/uL (ref 150–400)
RBC: 3.76 MIL/uL — AB (ref 4.22–5.81)
RDW: 15 % (ref 11.5–15.5)
WBC: 5.1 10*3/uL (ref 4.0–10.5)

## 2018-01-16 LAB — GLUCOSE, CAPILLARY
GLUCOSE-CAPILLARY: 67 mg/dL — AB (ref 70–99)
Glucose-Capillary: 169 mg/dL — ABNORMAL HIGH (ref 70–99)
Glucose-Capillary: 107 mg/dL — ABNORMAL HIGH (ref 70–99)

## 2018-01-16 LAB — COMPREHENSIVE METABOLIC PANEL
ALBUMIN: 2.8 g/dL — AB (ref 3.5–5.0)
ALK PHOS: 79 U/L (ref 38–126)
ALT: 19 U/L (ref 0–44)
ANION GAP: 12 (ref 5–15)
AST: 13 U/L — ABNORMAL LOW (ref 15–41)
BILIRUBIN TOTAL: 0.6 mg/dL (ref 0.3–1.2)
BUN: 77 mg/dL — ABNORMAL HIGH (ref 8–23)
CALCIUM: 7.9 mg/dL — AB (ref 8.9–10.3)
CO2: 20 mmol/L — ABNORMAL LOW (ref 22–32)
Chloride: 106 mmol/L (ref 98–111)
Creatinine, Ser: 8.16 mg/dL — ABNORMAL HIGH (ref 0.61–1.24)
GFR calc Af Amer: 7 mL/min — ABNORMAL LOW (ref >60)
GFR calc non Af Amer: 6 mL/min — ABNORMAL LOW (ref >60)
Glucose, Bld: 104 mg/dL — ABNORMAL HIGH (ref 70–99)
POTASSIUM: 3.9 mmol/L (ref 3.5–5.1)
Sodium: 138 mmol/L (ref 135–145)
TOTAL PROTEIN: 6.3 g/dL — AB (ref 6.5–8.1)

## 2018-01-16 LAB — PHOSPHORUS: Phosphorus: 5.4 mg/dL — ABNORMAL HIGH (ref 2.5–4.6)

## 2018-01-16 LAB — FERRITIN: FERRITIN: 204 ng/mL (ref 24–336)

## 2018-01-16 LAB — HIV ANTIBODY (ROUTINE TESTING W REFLEX): HIV Screen 4th Generation wRfx: NONREACTIVE

## 2018-01-16 MED ORDER — SODIUM BICARBONATE 650 MG PO TABS
650.0000 mg | ORAL_TABLET | Freq: Two times a day (BID) | ORAL | Status: DC
  Administered 2018-01-16: 650 mg via ORAL
  Filled 2018-01-16: qty 1

## 2018-01-16 MED ORDER — SODIUM BICARBONATE 650 MG PO TABS: 650.0000 mg | ORAL_TABLET | Freq: Two times a day (BID) | ORAL | 1 refills | Status: DC

## 2018-01-16 MED ORDER — SODIUM CHLORIDE 0.9 % IV SOLN
510.0000 mg | Freq: Once | INTRAVENOUS | Status: AC
  Administered 2018-01-16: 510 mg via INTRAVENOUS
  Filled 2018-01-16: qty 17

## 2018-01-16 MED ORDER — ONDANSETRON 4 MG PO TBDP: 4.0000 mg | ORAL_TABLET | Freq: Three times a day (TID) | ORAL | 1 refills | Status: DC | PRN

## 2018-01-16 MED ORDER — SODIUM CHLORIDE 0.9 % IV SOLN
INTRAVENOUS | Status: DC | PRN
  Administered 2018-01-16: 500 mL via INTRAVENOUS
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
## 2018-01-16 NOTE — Care Management Obs Status (Signed)
[facility] NOTIFICATION   Patient Details  Name: Jonathan Mcneil MRN: [id number] Date of Birth: 1955-06-29   Medicare Observation Status Notification Given:  Yes    Fuller Mandril, RN 01/16/2018, 12:15 PM
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
## 2018-01-16 NOTE — Progress Notes (Signed)
   Vein mapping is pending.  We will plan dialysis access based on results likely left upper extremity.  Keep left arm restricted.  If he is to be discharged we can plan this as an outpatient.  Jovanka Westgate C. Donzetta Matters, MD [facility] Office: [phone] Pager: [phone]

## 2018-01-16 NOTE — Progress Notes (Addendum)
*  Preliminary Results*  Vein mapping completed.   Incidental finding: there is a heterogenous area of the left mid medial upper arm measuring 7.9 x 1.7 x 6.6cm that exhibits low-resistance vascularity. Etiology is unknown, further imaging may be warranted if clinically indicated.  01/16/2018 10:57 AM Maudry Mayhew, MHA, RVT, RDCS, RDMS

## 2018-01-27 ENCOUNTER — Other Ambulatory Visit: Payer: Self-pay | Admitting: *Deleted

## 2018-02-05 DIAGNOSIS — E1122 Type 2 diabetes mellitus with diabetic chronic kidney disease: Secondary | ICD-10-CM | POA: Diagnosis not present

## 2018-02-05 DIAGNOSIS — N185 Chronic kidney disease, stage 5: Secondary | ICD-10-CM | POA: Diagnosis not present

## 2018-02-05 DIAGNOSIS — R252 Cramp and spasm: Secondary | ICD-10-CM | POA: Diagnosis not present

## 2018-02-05 DIAGNOSIS — I120 Hypertensive chronic kidney disease with stage 5 chronic kidney disease or end stage renal disease: Secondary | ICD-10-CM | POA: Diagnosis not present

## 2018-02-05 DIAGNOSIS — D649 Anemia, unspecified: Secondary | ICD-10-CM | POA: Diagnosis not present

## 2018-02-05 DIAGNOSIS — N2581 Secondary hyperparathyroidism of renal origin: Secondary | ICD-10-CM | POA: Diagnosis not present

## 2018-02-10 NOTE — Anesthesia Preprocedure Evaluation (Addendum)
Anesthesia Evaluation  Patient identified by MRN, date of birth, ID band Patient awake    Reviewed: Allergy & Precautions, H&P , NPO status , Patient's Chart, lab work & pertinent test results  Airway Mallampati: II  TM Distance: >3 FB Neck ROM: Full    Dental no notable dental hx. (+) Poor Dentition, Dental Advisory Given   Pulmonary neg pulmonary ROS, former smoker,    Pulmonary exam normal breath sounds clear to auscultation       Cardiovascular Exercise Tolerance: Good hypertension, Pt. on medications  Rhythm:Regular Rate:Normal     Neuro/Psych Schizophrenia negative neurological ROS     GI/Hepatic negative GI ROS, Neg liver ROS,   Endo/Other  diabetes  Renal/GU Renal InsufficiencyRenal disease  negative genitourinary   Musculoskeletal   Abdominal   Peds  Hematology negative hematology ROS (+)   Anesthesia Other Findings   Reproductive/Obstetrics negative OB ROS                            Anesthesia Physical Anesthesia Plan  ASA: III  Anesthesia Plan: General   Post-op Pain Management:    Induction: Intravenous  PONV Risk Score and Plan: 3 and Ondansetron, Dexamethasone and Midazolam  Airway Management Planned: LMA  Additional Equipment:   Intra-op Plan:   Post-operative Plan: Extubation in OR  Informed Consent: I have reviewed the patients History and Physical, chart, labs and discussed the procedure including the risks, benefits and alternatives for the proposed anesthesia with the patient or authorized representative who has indicated his/her understanding and acceptance.   Dental advisory given  Plan Discussed with: CRNA  Anesthesia Plan Comments:         Anesthesia Quick Evaluation

## 2018-02-10 NOTE — Progress Notes (Signed)
Pre-op instructions provided to pt spouse, Joaquim Lai. Please complete pt assessment on DOS. Spouse stated that pt will not check his BG the morning of procedure " he don't have time." Spouse stated that pt is no longer taking diabetes medication. Spouse verbalized understanding of all pre-op instructions.

## 2018-02-11 ENCOUNTER — Ambulatory Visit (HOSPITAL_COMMUNITY): Payer: Medicare Other | Admitting: Anesthesiology

## 2018-02-11 ENCOUNTER — Encounter (HOSPITAL_COMMUNITY): Payer: Self-pay | Admitting: Certified Registered Nurse Anesthetist

## 2018-02-11 ENCOUNTER — Other Ambulatory Visit: Payer: Self-pay

## 2018-02-11 ENCOUNTER — Encounter (HOSPITAL_COMMUNITY)
Admission: RE | Disposition: A | Discharge status: Home / Self Care | Payer: Self-pay | Source: Ambulatory Visit | Attending: Vascular Surgery

## 2018-02-11 ENCOUNTER — Encounter: Payer: Self-pay | Admitting: Vascular Surgery

## 2018-02-11 ENCOUNTER — Ambulatory Visit (HOSPITAL_COMMUNITY)
Admission: RE | Admit: 2018-02-11 | Discharge: 2018-02-11 | Disposition: A | Discharge status: Home / Self Care | Payer: Medicare Other | Source: Ambulatory Visit | Attending: Vascular Surgery | Admitting: Vascular Surgery

## 2018-02-11 DIAGNOSIS — I120 Hypertensive chronic kidney disease with stage 5 chronic kidney disease or end stage renal disease: Secondary | ICD-10-CM | POA: Diagnosis not present

## 2018-02-11 DIAGNOSIS — N186 End stage renal disease: Secondary | ICD-10-CM | POA: Insufficient documentation

## 2018-02-11 DIAGNOSIS — Z87891 Personal history of nicotine dependence: Secondary | ICD-10-CM | POA: Insufficient documentation

## 2018-02-11 DIAGNOSIS — N189 Chronic kidney disease, unspecified: Secondary | ICD-10-CM

## 2018-02-11 DIAGNOSIS — I129 Hypertensive chronic kidney disease with stage 1 through stage 4 chronic kidney disease, or unspecified chronic kidney disease: Secondary | ICD-10-CM | POA: Diagnosis not present

## 2018-02-11 DIAGNOSIS — N184 Chronic kidney disease, stage 4 (severe): Secondary | ICD-10-CM | POA: Diagnosis not present

## 2018-02-11 DIAGNOSIS — E1122 Type 2 diabetes mellitus with diabetic chronic kidney disease: Secondary | ICD-10-CM | POA: Insufficient documentation

## 2018-02-11 DIAGNOSIS — N185 Chronic kidney disease, stage 5: Secondary | ICD-10-CM | POA: Diagnosis not present

## 2018-02-11 HISTORY — DX: Myoneural disorder, unspecified: G70.9

## 2018-02-11 HISTORY — PX: AV FISTULA PLACEMENT: SHX1204

## 2018-02-11 LAB — POCT I-STAT 4, (NA,K, GLUC, HGB,HCT)
GLUCOSE: 111 mg/dL — AB (ref 70–99)
HCT: 27 % — ABNORMAL LOW (ref 39.0–52.0)
Hemoglobin: 9.2 g/dL — ABNORMAL LOW (ref 13.0–17.0)
Potassium: 4.6 mmol/L (ref 3.5–5.1)
Sodium: 139 mmol/L (ref 135–145)

## 2018-02-11 LAB — GLUCOSE, CAPILLARY
GLUCOSE-CAPILLARY: 112 mg/dL — AB (ref 70–99)
Glucose-Capillary: 127 mg/dL — ABNORMAL HIGH (ref 70–99)

## 2018-02-11 SURGERY — INSERTION OF ARTERIOVENOUS (AV) GORE-TEX GRAFT ARM
Anesthesia: General | Site: Arm Lower | Laterality: Left

## 2018-02-11 MED ORDER — SODIUM CHLORIDE 0.9 % IV SOLN
INTRAVENOUS | Status: AC
  Filled 2018-02-11: qty 1.2

## 2018-02-11 MED ORDER — PROPOFOL 10 MG/ML IV BOLUS
INTRAVENOUS | Status: AC
  Filled 2018-02-11: qty 40

## 2018-02-11 MED ORDER — VANCOMYCIN HCL IN DEXTROSE 1-5 GM/200ML-% IV SOLN
1000.0000 mg | INTRAVENOUS | Status: AC
  Administered 2018-02-11: 1000 mg via INTRAVENOUS
  Filled 2018-02-11: qty 200

## 2018-02-11 MED ORDER — SODIUM CHLORIDE 0.9 % IV SOLN
INTRAVENOUS | Status: DC | PRN
  Administered 2018-02-11: 30 ug/min via INTRAVENOUS

## 2018-02-11 MED ORDER — LIDOCAINE-EPINEPHRINE (PF) 1 %-1:200000 IJ SOLN
INTRAMUSCULAR | Status: AC
  Filled 2018-02-11: qty 30

## 2018-02-11 MED ORDER — EPHEDRINE SULFATE 50 MG/ML IJ SOLN
INTRAMUSCULAR | Status: DC | PRN
  Administered 2018-02-11: 5 mg via INTRAVENOUS
  Administered 2018-02-11: 10 mg via INTRAVENOUS

## 2018-02-11 MED ORDER — OXYCODONE-ACETAMINOPHEN 5-325 MG PO TABS: 1.0000 | ORAL_TABLET | Freq: Four times a day (QID) | ORAL | 0 refills | Status: DC | PRN

## 2018-02-11 MED ORDER — LIDOCAINE HCL (CARDIAC) PF 100 MG/5ML IV SOSY
PREFILLED_SYRINGE | INTRAVENOUS | Status: DC | PRN
  Administered 2018-02-11: 60 mg via INTRAVENOUS

## 2018-02-11 MED ORDER — ONDANSETRON HCL 4 MG/2ML IJ SOLN
INTRAMUSCULAR | Status: DC | PRN
  Administered 2018-02-11: 4 mg via INTRAVENOUS

## 2018-02-11 MED ORDER — 0.9 % SODIUM CHLORIDE (POUR BTL) OPTIME
TOPICAL | Status: DC | PRN
  Administered 2018-02-11: 1000 mL

## 2018-02-11 MED ORDER — MIDAZOLAM HCL 5 MG/5ML IJ SOLN
INTRAMUSCULAR | Status: DC | PRN
  Administered 2018-02-11: 2 mg via INTRAVENOUS

## 2018-02-11 MED ORDER — FENTANYL CITRATE (PF) 100 MCG/2ML IJ SOLN
INTRAMUSCULAR | Status: DC | PRN
  Administered 2018-02-11: 50 ug via INTRAVENOUS

## 2018-02-11 MED ORDER — FENTANYL CITRATE (PF) 100 MCG/2ML IJ SOLN: 25.0000 ug | INTRAMUSCULAR | Status: DC | PRN

## 2018-02-11 MED ORDER — SODIUM CHLORIDE 0.9 % IV SOLN
INTRAVENOUS | Status: DC
  Administered 2018-02-11: 08:00:00 via INTRAVENOUS

## 2018-02-11 MED ORDER — MIDAZOLAM HCL 2 MG/2ML IJ SOLN
INTRAMUSCULAR | Status: AC
  Filled 2018-02-11: qty 2

## 2018-02-11 MED ORDER — PROPOFOL 10 MG/ML IV BOLUS
INTRAVENOUS | Status: DC | PRN
  Administered 2018-02-11: 150 mg via INTRAVENOUS

## 2018-02-11 MED ORDER — FENTANYL CITRATE (PF) 250 MCG/5ML IJ SOLN
INTRAMUSCULAR | Status: AC
  Filled 2018-02-11: qty 5

## 2018-02-11 MED ORDER — SODIUM CHLORIDE 0.9 % IV SOLN
INTRAVENOUS | Status: DC | PRN
  Administered 2018-02-11: 09:00:00

## 2018-02-11 MED ORDER — DEXAMETHASONE SODIUM PHOSPHATE 4 MG/ML IJ SOLN
INTRAMUSCULAR | Status: DC | PRN
  Administered 2018-02-11: 5 mg via INTRAVENOUS

## 2018-02-11 SURGICAL SUPPLY — 39 items
ADH SKN CLS APL DERMABOND .7 (GAUZE/BANDAGES/DRESSINGS) ×1
ADH SKN CLS LQ APL DERMABOND (GAUZE/BANDAGES/DRESSINGS) ×1
ARMBAND PINK RESTRICT EXTREMIT (MISCELLANEOUS) ×6 IMPLANT
CANISTER SUCT 3000ML PPV (MISCELLANEOUS) ×3 IMPLANT
CLIP VESOCCLUDE MED 6/CT (CLIP) ×3 IMPLANT
CLIP VESOCCLUDE SM WIDE 6/CT (CLIP) ×3 IMPLANT
COVER SURGICAL LIGHT HANDLE (MISCELLANEOUS) ×2 IMPLANT
COVER WAND RF STERILE (DRAPES) ×3 IMPLANT
DERMABOND ADHESIVE PROPEN (GAUZE/BANDAGES/DRESSINGS) ×2
DERMABOND ADVANCED (GAUZE/BANDAGES/DRESSINGS) ×2
DERMABOND ADVANCED .7 DNX12 (GAUZE/BANDAGES/DRESSINGS) ×1 IMPLANT
DERMABOND ADVANCED .7 DNX6 (GAUZE/BANDAGES/DRESSINGS) IMPLANT
ELECT CAUTERY BLADE 6.4 (BLADE) ×2 IMPLANT
ELECT REM PT RETURN 9FT ADLT (ELECTROSURGICAL) ×3
ELECTRODE REM PT RTRN 9FT ADLT (ELECTROSURGICAL) ×1 IMPLANT
GLOVE BIO SURGEON STRL SZ7.5 (GLOVE) ×3 IMPLANT
GLOVE BIOGEL M 6.5 STRL (GLOVE) ×8 IMPLANT
GLOVE SURG SS PI 7.5 STRL IVOR (GLOVE) ×2 IMPLANT
GOWN STRL REUS W/ TWL LRG LVL3 (GOWN DISPOSABLE) ×2 IMPLANT
GOWN STRL REUS W/ TWL XL LVL3 (GOWN DISPOSABLE) ×1 IMPLANT
GOWN STRL REUS W/TWL LRG LVL3 (GOWN DISPOSABLE) ×9
GOWN STRL REUS W/TWL XL LVL3 (GOWN DISPOSABLE) ×3
HEMOSTAT SNOW SURGICEL 2X4 (HEMOSTASIS) IMPLANT
INSERT FOGARTY SM (MISCELLANEOUS) ×3 IMPLANT
KIT BASIN OR (CUSTOM PROCEDURE TRAY) ×3 IMPLANT
KIT TURNOVER KIT B (KITS) ×3 IMPLANT
NS IRRIG 1000ML POUR BTL (IV SOLUTION) ×3 IMPLANT
PACK CV ACCESS (CUSTOM PROCEDURE TRAY) ×3 IMPLANT
PAD ARMBOARD 7.5X6 YLW CONV (MISCELLANEOUS) ×6 IMPLANT
PENCIL BUTTON HOLSTER BLD 10FT (ELECTRODE) ×2 IMPLANT
SUT MNCRL AB 4-0 PS2 18 (SUTURE) IMPLANT
SUT PROLENE 6 0 BV (SUTURE) ×4 IMPLANT
SUT SILK 2 0 SH (SUTURE) IMPLANT
SUT VIC AB 3-0 SH 27 (SUTURE) ×6
SUT VIC AB 3-0 SH 27X BRD (SUTURE) ×2 IMPLANT
SYR TOOMEY 50ML (SYRINGE) IMPLANT
TOWEL GREEN STERILE (TOWEL DISPOSABLE) ×3 IMPLANT
UNDERPAD 30X30 (UNDERPADS AND DIAPERS) ×3 IMPLANT
WATER STERILE IRR 1000ML POUR (IV SOLUTION) ×3 IMPLANT

## 2018-02-11 NOTE — Anesthesia Procedure Notes (Signed)
Procedure Name: LMA Insertion Date/Time: 02/11/2018 8:35 AM Performed by: Candis Shine, CRNA Pre-anesthesia Checklist: Patient identified, Emergency Drugs available, Suction available and Patient being monitored Patient Re-evaluated:Patient Re-evaluated prior to induction Oxygen Delivery Method: Circle System Utilized Preoxygenation: Pre-oxygenation with 100% oxygen Induction Type: IV induction Ventilation: Mask ventilation without difficulty LMA: LMA inserted LMA Size: 5.0 Number of attempts: 1 Placement Confirmation: positive ETCO2,  CO2 detector and breath sounds checked- equal and bilateral Tube secured with: Tape Dental Injury: Teeth and Oropharynx as per pre-operative assessment  Comments: LMA inserted by Norm Salt, [place]

## 2018-02-11 NOTE — H&P (Signed)
   History and Physical Update  The patient was interviewed and re-examined.  The patient's previous History and Physical has been reviewed and is unchanged from recent evaluation. Plan for left arm avg or avf.  Lennie Dunnigan C. Donzetta Matters, MD [facility] [place] Office: [phone] Pager: [phone]   02/11/2018, 8:28 AM

## 2018-02-11 NOTE — Anesthesia Postprocedure Evaluation (Signed)
Anesthesia Post Note  Patient: Tobi Bastos Siebel  Procedure(s) Performed: INSERTION OF ARTERIOVENOUS (AV) FISTULA LEFT  ARM (Left Arm Lower)     Patient location during evaluation: PACU Anesthesia Type: General Level of consciousness: awake and alert Pain management: pain level controlled Vital Signs Assessment: post-procedure vital signs reviewed and stable Respiratory status: spontaneous breathing, nonlabored ventilation and respiratory function stable Cardiovascular status: blood pressure returned to baseline and stable Postop Assessment: no apparent nausea or vomiting Anesthetic complications: no    Last Vitals:  Vitals:   02/11/18 1030 02/11/18 1143  BP: (!) 150/99 138/87  Pulse: 82 83  Resp: 16   Temp:    SpO2: 97% 100%    Last Pain:  Vitals:   02/11/18 1143  TempSrc:   PainSc: 0-No pain                 Shahir Karen,W. EDMOND

## 2018-02-11 NOTE — Transfer of Care (Signed)
Immediate Anesthesia Transfer of Care Note  Patient: Jonathan Mcneil  Procedure(s) Performed: INSERTION OF ARTERIOVENOUS (AV) FISTULA LEFT  ARM (Left Arm Lower)  Patient Location: PACU  Anesthesia Type:General  Level of Consciousness: awake and patient cooperative  Airway & Oxygen Therapy: Patient Spontanous Breathing and Patient connected to face mask oxygen  Post-op Assessment: Report given to RN and Post -op Vital signs reviewed and stable  Post vital signs: Reviewed and stable  Last Vitals:  Vitals Value Taken Time  BP 146/94 02/11/2018  9:45 AM  Temp    Pulse 86 02/11/2018  9:47 AM  Resp 20 02/11/2018  9:47 AM  SpO2 100 % 02/11/2018  9:47 AM  Vitals shown include unvalidated device data.  Last Pain:  Vitals:   02/11/18 0658  TempSrc:   PainSc: 7       Patients Stated Pain Goal: 2 (74/82/70 7867)  Complications: No apparent anesthesia complications

## 2018-02-11 NOTE — Op Note (Signed)
    Patient name: Jonathan Mcneil MRN: [id number] DOB: 1955-08-04 Sex: male  02/11/2018 Pre-operative Diagnosis: Chronic kidney disease Post-operative diagnosis:  Same Surgeon:  Eda Paschal. Donzetta Matters, MD Assistant: Leontine Locket, PA Procedure Performed: Left arm basilic vein transposition fistula   Indications: 62 year old male with chronic kidney disease is indicated for permanent access.  We have evaluated him for left arm AV graft versus fistula but he did not have any suitable vein on preoperative vein mapping.  Findings: By intraoperative ultrasound we identified a basilic vein that measures 0.39 cm below the antecubitum did have branching but remained large throughout the upper arm and joined the deep veins near the axilla.   Procedure:  The patient was identified in the holding area and taken to the operating room where he was placed supine the operative table and LMA anesthesia induced.  He was sterilely prepped and draped in left upper extremity usual fashion, antibiotics were administered and a timeout was called.  We began using ultrasound to evaluate the upper extremity veins.  There was no discernible cephalic vein above the antecubitum.  We did identify the basilic vein with the above findings.  We also identified the brachial artery was noted to be soft readily palpable below the antecubitum as well.  Transverse incision was made below the antecubitum.  We identified the vein marked for orientation.  We then dissected through the deep fascia identified the brachial artery placed a vessel loop around this.  We did have to divide 1 of the 2 brachial veins and the nerve was protected.  The basilic vein was then divided distally dilated to 4 mm and spatulated.  The artery was clamped distally and proximally opened longitudinally flushed with heparinized saline both directions.  The vein was then sewn end-to-side with 6-0 Prolene suture.  Prior to completion anastomosis we allowed flushing all  directions.  At completion was a strong thrill.  We freed up some soft tissue around the vein itself.  We then confirmed Doppler flow throughout the vein that was absent with compression of the vein.  There is a strong radial signal at the wrist that augmented with compression of the vein as well.  With this we irrigated the wound closed in 2 layers of Vicryl and Monocryl.  He was then awakened from anesthesia having tolerated procedure well without immediate complication.  All counts were correct at completion.  EBL 25 cc    C. Donzetta Matters, MD [facility] [place] Office: [phone] Pager: [phone]

## 2018-02-11 NOTE — Discharge Instructions (Signed)
° °  [facility]  Discharge Instructions  AV Fistula or Graft Surgery for Dialysis Access  Please refer to the following instructions for your post-procedure care. Your surgeon or physician assistant will discuss any changes with you.  Activity  You may drive the day following your surgery, if you are comfortable and no longer taking prescription pain medication. Resume full activity as the soreness in your incision resolves.  Bathing/Showering  You may shower after you go home. Keep your incision dry for 48 hours. Do not soak in a bathtub, hot tub, or swim until the incision heals completely. You may not shower if you have a hemodialysis catheter.  Incision Care  Clean your incision with mild soap and water after 48 hours. Pat the area dry with a clean towel. You do not need a bandage unless otherwise instructed. Do not apply any ointments or creams to your incision. You may have skin glue on your incision. Do not peel it off. It will come off on its own in about one week. Your arm may swell a bit after surgery. To reduce swelling use pillows to elevate your arm so it is above your heart. Your doctor will tell you if you need to lightly wrap your arm with an ACE bandage.  Diet  Resume your normal diet. There are not special food restrictions following this procedure. In order to heal from your surgery, it is CRITICAL to get adequate nutrition. Your body requires vitamins, minerals, and protein. Vegetables are the best source of vitamins and minerals. Vegetables also provide the perfect balance of protein. Processed food has little nutritional value, so try to avoid this.  Medications  Resume taking all of your medications. If your incision is causing pain, you may take over-the counter pain relievers such as acetaminophen (Tylenol). If you were prescribed a stronger pain medication, please be aware these medications can cause nausea and constipation. Prevent  nausea by taking the medication with a snack or meal. Avoid constipation by drinking plenty of fluids and eating foods with high amount of fiber, such as fruits, vegetables, and grains.  Do not take Tylenol if you are taking prescription pain medications.  Follow up Your surgeon may want to see you in the office following your access surgery. If so, this will be arranged at the time of your surgery.  Please call us immediately for any of the following conditions:  Increased pain, redness, drainage (pus) from your incision site Fever of 101 degrees or higher Severe or worsening pain at your incision site Hand pain or numbness.  Reduce your risk of vascular disease:  Stop smoking. If you would like help, call [facility] at 1-800-QUIT-NOW (782)844-0936) or [place] at [place] your cholesterol Maintain a desired weight Control your diabetes Keep your blood pressure down  Dialysis  It will take several weeks to several months for your new dialysis access to be ready for use. Your surgeon will determine when it is okay to use it. Your nephrologist will continue to direct your dialysis. You can continue to use your Permcath until your new access is ready for use.   02/11/2018 Jonathan Mcneil [id number] 09-Jan-1956  Surgeon(s): [name], MD  Procedure(s): Creation of left arm 1st stage basilic vein transposition  x Do not stick fistula for 12 weeks    If you have any questions, please call the office at [phone].

## 2018-02-12 ENCOUNTER — Encounter (HOSPITAL_COMMUNITY): Payer: Self-pay | Admitting: Vascular Surgery

## 2018-02-12 ENCOUNTER — Telehealth: Payer: Self-pay | Admitting: Vascular Surgery

## 2018-02-12 NOTE — Telephone Encounter (Signed)
-----   Message from Mena Goes, RN sent at 02/11/2018 10:30 AM EST ----- Regarding: 4-6 weeks with Dr. Donzetta Matters and duplex to discuss next surgery   ----- Message ----- From: Natividad Brood Sent: 02/11/2018   9:34 AM EST To: [facility] Admin Pool, [facility] Charge Pool  S/p 1st stage left BVT 02/11/18.  Fu with Dr. Donzetta Matters (MD only) to discuss 2nd stage BVT in 4-6 weeks with duplex.  Thanks

## 2018-02-12 NOTE — Telephone Encounter (Signed)
sch appt spk to pt wife mld ltr 04/03/2018 11am Dialysis Duplex 12pm p/o MD

## 2018-02-24 ENCOUNTER — Inpatient Hospital Stay (HOSPITAL_COMMUNITY)
Admission: EM | Admit: 2018-02-24 | Discharge: 2018-03-02 | DRG: 673 | Disposition: A | Discharge status: Home / Self Care | Payer: Medicare Other | Attending: Internal Medicine | Admitting: Internal Medicine

## 2018-02-24 ENCOUNTER — Other Ambulatory Visit: Payer: Self-pay

## 2018-02-24 ENCOUNTER — Encounter (HOSPITAL_COMMUNITY): Payer: Self-pay

## 2018-02-24 ENCOUNTER — Emergency Department (HOSPITAL_COMMUNITY): Payer: Medicare Other

## 2018-02-24 DIAGNOSIS — E119 Type 2 diabetes mellitus without complications: Secondary | ICD-10-CM

## 2018-02-24 DIAGNOSIS — I509 Heart failure, unspecified: Secondary | ICD-10-CM | POA: Diagnosis not present

## 2018-02-24 DIAGNOSIS — R0602 Shortness of breath: Secondary | ICD-10-CM

## 2018-02-24 DIAGNOSIS — E8770 Fluid overload, unspecified: Secondary | ICD-10-CM | POA: Diagnosis not present

## 2018-02-24 DIAGNOSIS — Z79899 Other long term (current) drug therapy: Secondary | ICD-10-CM | POA: Diagnosis not present

## 2018-02-24 DIAGNOSIS — F1490 Cocaine use, unspecified, uncomplicated: Secondary | ICD-10-CM | POA: Diagnosis present

## 2018-02-24 DIAGNOSIS — Z95828 Presence of other vascular implants and grafts: Secondary | ICD-10-CM

## 2018-02-24 DIAGNOSIS — E1122 Type 2 diabetes mellitus with diabetic chronic kidney disease: Secondary | ICD-10-CM | POA: Diagnosis present

## 2018-02-24 DIAGNOSIS — E785 Hyperlipidemia, unspecified: Secondary | ICD-10-CM | POA: Diagnosis not present

## 2018-02-24 DIAGNOSIS — N19 Unspecified kidney failure: Secondary | ICD-10-CM | POA: Diagnosis present

## 2018-02-24 DIAGNOSIS — Z888 Allergy status to other drugs, medicaments and biological substances status: Secondary | ICD-10-CM

## 2018-02-24 DIAGNOSIS — Z23 Encounter for immunization: Secondary | ICD-10-CM | POA: Diagnosis not present

## 2018-02-24 DIAGNOSIS — N185 Chronic kidney disease, stage 5: Secondary | ICD-10-CM | POA: Diagnosis not present

## 2018-02-24 DIAGNOSIS — Z841 Family history of disorders of kidney and ureter: Secondary | ICD-10-CM | POA: Diagnosis not present

## 2018-02-24 DIAGNOSIS — E872 Acidosis, unspecified: Secondary | ICD-10-CM | POA: Diagnosis present

## 2018-02-24 DIAGNOSIS — G629 Polyneuropathy, unspecified: Secondary | ICD-10-CM | POA: Diagnosis not present

## 2018-02-24 DIAGNOSIS — N186 End stage renal disease: Secondary | ICD-10-CM | POA: Diagnosis present

## 2018-02-24 DIAGNOSIS — F1210 Cannabis abuse, uncomplicated: Secondary | ICD-10-CM | POA: Diagnosis present

## 2018-02-24 DIAGNOSIS — N179 Acute kidney failure, unspecified: Principal | ICD-10-CM | POA: Diagnosis present

## 2018-02-24 DIAGNOSIS — Z862 Personal history of diseases of the blood and blood-forming organs and certain disorders involving the immune mechanism: Secondary | ICD-10-CM

## 2018-02-24 DIAGNOSIS — I132 Hypertensive heart and chronic kidney disease with heart failure and with stage 5 chronic kidney disease, or end stage renal disease: Secondary | ICD-10-CM | POA: Diagnosis present

## 2018-02-24 DIAGNOSIS — I120 Hypertensive chronic kidney disease with stage 5 chronic kidney disease or end stage renal disease: Secondary | ICD-10-CM | POA: Diagnosis not present

## 2018-02-24 DIAGNOSIS — Z87891 Personal history of nicotine dependence: Secondary | ICD-10-CM | POA: Diagnosis not present

## 2018-02-24 DIAGNOSIS — J811 Chronic pulmonary edema: Secondary | ICD-10-CM | POA: Diagnosis not present

## 2018-02-24 DIAGNOSIS — I5031 Acute diastolic (congestive) heart failure: Secondary | ICD-10-CM | POA: Diagnosis present

## 2018-02-24 DIAGNOSIS — Z992 Dependence on renal dialysis: Secondary | ICD-10-CM

## 2018-02-24 DIAGNOSIS — I10 Essential (primary) hypertension: Secondary | ICD-10-CM | POA: Diagnosis present

## 2018-02-24 DIAGNOSIS — Z66 Do not resuscitate: Secondary | ICD-10-CM | POA: Diagnosis not present

## 2018-02-24 DIAGNOSIS — Z452 Encounter for adjustment and management of vascular access device: Secondary | ICD-10-CM | POA: Diagnosis not present

## 2018-02-24 DIAGNOSIS — Z880 Allergy status to penicillin: Secondary | ICD-10-CM | POA: Diagnosis not present

## 2018-02-24 DIAGNOSIS — D631 Anemia in chronic kidney disease: Secondary | ICD-10-CM | POA: Diagnosis not present

## 2018-02-24 DIAGNOSIS — F209 Schizophrenia, unspecified: Secondary | ICD-10-CM | POA: Diagnosis present

## 2018-02-24 DIAGNOSIS — N184 Chronic kidney disease, stage 4 (severe): Secondary | ICD-10-CM

## 2018-02-24 DIAGNOSIS — N189 Chronic kidney disease, unspecified: Secondary | ICD-10-CM

## 2018-02-24 HISTORY — DX: End stage renal disease: N18.6

## 2018-02-24 LAB — CBC
HEMATOCRIT: 24.9 % — AB (ref 39.0–52.0)
HEMOGLOBIN: 7.8 g/dL — AB (ref 13.0–17.0)
MCH: 28.7 pg (ref 26.0–34.0)
MCHC: 31.3 g/dL (ref 30.0–36.0)
MCV: 91.5 fL (ref 80.0–100.0)
Platelets: 291 10*3/uL (ref 150–400)
RBC: 2.72 MIL/uL — ABNORMAL LOW (ref 4.22–5.81)
RDW: 15.9 % — ABNORMAL HIGH (ref 11.5–15.5)
WBC: 8.8 10*3/uL (ref 4.0–10.5)
nRBC: 0 % (ref 0.0–0.2)

## 2018-02-24 LAB — COMPREHENSIVE METABOLIC PANEL
ALT: 50 U/L — ABNORMAL HIGH (ref 0–44)
AST: 40 U/L (ref 15–41)
Albumin: 2.6 g/dL — ABNORMAL LOW (ref 3.5–5.0)
Alkaline Phosphatase: 101 U/L (ref 38–126)
Anion gap: 13 (ref 5–15)
BUN: 122 mg/dL — ABNORMAL HIGH (ref 8–23)
CO2: 18 mmol/L — ABNORMAL LOW (ref 22–32)
Calcium: 7.3 mg/dL — ABNORMAL LOW (ref 8.9–10.3)
Chloride: 103 mmol/L (ref 98–111)
Creatinine, Ser: 10.6 mg/dL — ABNORMAL HIGH (ref 0.61–1.24)
GFR calc Af Amer: 5 mL/min — ABNORMAL LOW (ref >60)
GFR calc non Af Amer: 5 mL/min — ABNORMAL LOW (ref >60)
GLUCOSE: 132 mg/dL — AB (ref 70–99)
Potassium: 4.8 mmol/L (ref 3.5–5.1)
Sodium: 134 mmol/L — ABNORMAL LOW (ref 135–145)
Total Bilirubin: 0.4 mg/dL (ref 0.3–1.2)
Total Protein: 6.3 g/dL — ABNORMAL LOW (ref 6.5–8.1)

## 2018-02-24 LAB — URINALYSIS, ROUTINE W REFLEX MICROSCOPIC
Bacteria, UA: NONE SEEN
Bilirubin Urine: NEGATIVE
Glucose, UA: 50 mg/dL — AB
Ketones, ur: NEGATIVE mg/dL
LEUKOCYTES UA: NEGATIVE
Nitrite: NEGATIVE
PROTEIN: 100 mg/dL — AB
Specific Gravity, Urine: 1.011 (ref 1.005–1.030)
pH: 7 (ref 5.0–8.0)

## 2018-02-24 LAB — BRAIN NATRIURETIC PEPTIDE: B Natriuretic Peptide: 621.1 pg/mL — ABNORMAL HIGH (ref 0.0–100.0)

## 2018-02-24 LAB — RAPID URINE DRUG SCREEN, HOSP PERFORMED
AMPHETAMINES: NOT DETECTED
Barbiturates: NOT DETECTED
Benzodiazepines: NOT DETECTED
Cocaine: NOT DETECTED
OPIATES: NOT DETECTED
Tetrahydrocannabinol: NOT DETECTED

## 2018-02-24 LAB — I-STAT TROPONIN, ED: Troponin i, poc: 0.02 ng/mL (ref 0.00–0.08)

## 2018-02-24 LAB — POC OCCULT BLOOD, ED: Fecal Occult Bld: NEGATIVE

## 2018-02-24 MED ORDER — AMLODIPINE BESYLATE 10 MG PO TABS
10.0000 mg | ORAL_TABLET | Freq: Every day | ORAL | Status: DC
  Administered 2018-02-26 – 2018-03-02 (×5): 10 mg via ORAL
  Filled 2018-02-24 (×7): qty 1

## 2018-02-24 MED ORDER — ACETAMINOPHEN 650 MG RE SUPP: 650.0000 mg | Freq: Four times a day (QID) | RECTAL | Status: DC | PRN

## 2018-02-24 MED ORDER — FUROSEMIDE 10 MG/ML IJ SOLN
120.0000 mg | Freq: Once | INTRAVENOUS | Status: AC
  Administered 2018-02-24: 120 mg via INTRAVENOUS
  Filled 2018-02-24: qty 10

## 2018-02-24 MED ORDER — ONDANSETRON HCL 4 MG PO TABS: 4.0000 mg | ORAL_TABLET | Freq: Four times a day (QID) | ORAL | Status: DC | PRN

## 2018-02-24 MED ORDER — CHLORHEXIDINE GLUCONATE CLOTH 2 % EX PADS
6.0000 | MEDICATED_PAD | Freq: Every day | CUTANEOUS | Status: DC
  Administered 2018-02-25 – 2018-03-01 (×5): 6 via TOPICAL

## 2018-02-24 MED ORDER — ACETAMINOPHEN 325 MG PO TABS
650.0000 mg | ORAL_TABLET | Freq: Four times a day (QID) | ORAL | Status: DC | PRN
  Administered 2018-02-24 – 2018-03-01 (×2): 650 mg via ORAL
  Filled 2018-02-24: qty 2

## 2018-02-24 MED ORDER — LIDOCAINE-PRILOCAINE 2.5-2.5 % EX CREA
1.0000 "application " | TOPICAL_CREAM | CUTANEOUS | Status: DC | PRN
  Filled 2018-02-24: qty 5

## 2018-02-24 MED ORDER — SODIUM CHLORIDE 0.9 % IV SOLN
100.0000 mL | INTRAVENOUS | Status: DC | PRN
  Administered 2018-02-25: 10:00:00 via INTRAVENOUS

## 2018-02-24 MED ORDER — DOCUSATE SODIUM 283 MG RE ENEM
1.0000 | ENEMA | RECTAL | Status: DC | PRN
  Filled 2018-02-24: qty 1

## 2018-02-24 MED ORDER — ZOLPIDEM TARTRATE 5 MG PO TABS: 5.0000 mg | ORAL_TABLET | Freq: Every evening | ORAL | Status: DC | PRN

## 2018-02-24 MED ORDER — GABAPENTIN 100 MG PO CAPS
100.0000 mg | ORAL_CAPSULE | Freq: Three times a day (TID) | ORAL | Status: DC | PRN
  Administered 2018-02-24 – 2018-03-01 (×2): 100 mg via ORAL
  Filled 2018-02-24 (×2): qty 1

## 2018-02-24 MED ORDER — NEPRO/CARBSTEADY PO LIQD
237.0000 mL | Freq: Three times a day (TID) | ORAL | Status: DC | PRN
  Filled 2018-02-24: qty 237

## 2018-02-24 MED ORDER — PENTAFLUOROPROP-TETRAFLUOROETH EX AERO
1.0000 "application " | INHALATION_SPRAY | CUTANEOUS | Status: DC | PRN
  Filled 2018-02-24: qty 116

## 2018-02-24 MED ORDER — MUPIROCIN 2 % EX OINT
1.0000 "application " | TOPICAL_OINTMENT | Freq: Two times a day (BID) | CUTANEOUS | Status: AC
  Administered 2018-02-25 – 2018-02-26 (×4): 1 via NASAL
  Filled 2018-02-24: qty 22

## 2018-02-24 MED ORDER — ENOXAPARIN SODIUM 30 MG/0.3ML ~~LOC~~ SOLN
30.0000 mg | SUBCUTANEOUS | Status: DC
  Administered 2018-02-25 – 2018-03-01 (×5): 30 mg via SUBCUTANEOUS
  Filled 2018-02-24 (×5): qty 0.3

## 2018-02-24 MED ORDER — CALCIUM CARBONATE ANTACID 1250 MG/5ML PO SUSP
500.0000 mg | Freq: Four times a day (QID) | ORAL | Status: DC | PRN
  Filled 2018-02-24: qty 5

## 2018-02-24 MED ORDER — PRAVASTATIN SODIUM 10 MG PO TABS
20.0000 mg | ORAL_TABLET | Freq: Every day | ORAL | Status: DC
  Administered 2018-02-24 – 2018-03-01 (×6): 20 mg via ORAL
  Filled 2018-02-24 (×6): qty 2
  Filled 2018-02-24: qty 1

## 2018-02-24 MED ORDER — CAMPHOR-MENTHOL 0.5-0.5 % EX LOTN
1.0000 "application " | TOPICAL_LOTION | Freq: Three times a day (TID) | CUTANEOUS | Status: DC | PRN
  Filled 2018-02-24: qty 222

## 2018-02-24 MED ORDER — HEPARIN SODIUM (PORCINE) 1000 UNIT/ML DIALYSIS
1000.0000 [IU] | INTRAMUSCULAR | Status: DC | PRN
  Administered 2018-02-26: 1000 [IU] via INTRAVENOUS_CENTRAL
  Administered 2018-02-26: 3000 [IU] via INTRAVENOUS_CENTRAL
  Filled 2018-02-24 (×3): qty 1

## 2018-02-24 MED ORDER — PNEUMOCOCCAL VAC POLYVALENT 25 MCG/0.5ML IJ INJ
0.5000 mL | INJECTION | INTRAMUSCULAR | Status: AC
  Administered 2018-02-25: 0.5 mL via INTRAMUSCULAR
  Filled 2018-02-24: qty 0.5

## 2018-02-24 MED ORDER — ALTEPLASE 2 MG IJ SOLR
2.0000 mg | Freq: Once | INTRAMUSCULAR | Status: DC | PRN
  Filled 2018-02-24: qty 2

## 2018-02-24 MED ORDER — ONDANSETRON HCL 4 MG/2ML IJ SOLN
4.0000 mg | Freq: Four times a day (QID) | INTRAMUSCULAR | Status: DC | PRN
  Administered 2018-03-01 – 2018-03-02 (×2): 4 mg via INTRAVENOUS
  Filled 2018-02-24 (×2): qty 2

## 2018-02-24 MED ORDER — SODIUM CHLORIDE 0.9 % IV SOLN: 100.0000 mL | INTRAVENOUS | Status: DC | PRN

## 2018-02-24 MED ORDER — LIDOCAINE HCL (PF) 1 % IJ SOLN: 5.0000 mL | INTRAMUSCULAR | Status: DC | PRN

## 2018-02-24 MED ORDER — SORBITOL 70 % SOLN
30.0000 mL | Status: DC | PRN
  Filled 2018-02-24: qty 30

## 2018-02-24 MED ORDER — HYDROXYZINE HCL 25 MG PO TABS
25.0000 mg | ORAL_TABLET | Freq: Three times a day (TID) | ORAL | Status: DC | PRN
  Administered 2018-02-24: 25 mg via ORAL
  Filled 2018-02-24: qty 1

## 2018-02-24 NOTE — Consult Note (Addendum)
[facility]  HISTORY AND PHYSICAL  Jonathan Mcneil is an 62 y.o. male.    Chief Complaint: swelling and SOB  HPI: Pt is a 51M with a PMH sig for CKD V f/b Dr Johnney Ou, HTN, HLD, DM who is now seen in consultation at the request of Dr. Lorin Mercy for evaluation and recommendations surrounding management of ESRD and provision of HD.  Pt recently had an access placed 11/20 with Dr. Donzetta Matters in preparation for dialysis.  He has had SOB and swelling x 3 days.  Last Cr 8.16 12/2017.    In ED, BUN 122/ Cr 10.6.  Reporting SOB but not on O2.  Reports loss of appetite.  No metallic taste/ lethargy.  In the setting of CKD V progressing to ESRD, we are asked to see.    PMH: Past Medical History:  Diagnosis Date  . Diabetes mellitus without complication ([place])   . ESRD (end stage renal disease) ([place])   . Hypertension   . Low back pain   . Metabolic acidosis   . Neuromuscular disorder ([place])    peripheral neuropathy  . Pancreatitis   . Schizophrenia ([place])    does not take medications   PSH: Past Surgical History:  Procedure Laterality Date  . AV FISTULA PLACEMENT Left 02/11/2018   Procedure: INSERTION OF ARTERIOVENOUS (AV) FISTULA LEFT  ARM;  Surgeon: [name], MD;  Location: [facility];  Service: Vascular;  Laterality: Left;  . COLONOSCOPY    . DENTAL SURGERY       Past Medical History:  Diagnosis Date  . Diabetes mellitus without complication ([place])   . ESRD (end stage renal disease) ([place])   . Hypertension   . Low back pain   . Metabolic acidosis   . Neuromuscular disorder ([place])    peripheral neuropathy  . Pancreatitis   . Schizophrenia ([place])    does not take medications    Medications:   Scheduled: . [START ON 02/25/2018] amLODipine  10 mg Oral Daily  . enoxaparin (LOVENOX) injection  30 mg Subcutaneous Q24H  . pravastatin  20 mg Oral q1800     (Not in a hospital admission)  ALLERGIES:   Allergies  Allergen Reactions  . Ibuprofen Other (See  Comments)    UNSPECIFIED REACTION  Pt states he was told not to take it.  Marland Kitchen Penicillins Itching    Has patient had a PCN reaction causing immediate rash, facial/tongue/throat swelling, SOB or lightheadedness with hypotension: No Has patient had a PCN reaction causing severe rash involving mucus membranes or skin necrosis: No Has patient had a PCN reaction that required hospitalization No Has patient had a PCN reaction occurring within the last 10 years: Unknown If all of the above answers are "NO", then may proceed with Cephalosporin use.    FAM HX: Family History  Problem Relation Age of Onset  . Kidney failure Mother     Social History:   reports that he has quit smoking. He has never used smokeless tobacco. He reports that he has current or past drug history. Drugs: Cocaine and Marijuana. He reports that he does not drink alcohol.  ROS: ROS: all other systems reviewed and are negative except as per HPI  Blood pressure 132/84, pulse 94, temperature 98 F (36.7 C), temperature source Oral, resp. rate (!) 21, SpO2 98 %. PHYSICAL EXAM: Physical Exam  GEN NAD, lying propped up in bed HEENT EOMI PERRL NECK + JVD PULM slightly increased WOB, bibasilar crackles CV RRR ABD  distended EXT 3+ LE edema NEURO AAO x 3 ACCESS: LUE AVF + T/B    Results for orders placed or performed during the hospital encounter of 02/24/18 (from the past 48 hour(s))  Brain natriuretic peptide     Status: Abnormal   Collection Time: 02/24/18  7:08 AM  Result Value Ref Range   B Natriuretic Peptide 621.1 (H) 0.0 - 100.0 pg/mL    Comment: Performed at [facility], [address] [place], [place] [address]  CBC     Status: Abnormal   Collection Time: 02/24/18  7:48 AM  Result Value Ref Range   WBC 8.8 4.0 - 10.5 K/uL   RBC 2.72 (L) 4.22 - 5.81 MIL/uL   Hemoglobin 7.8 (L) 13.0 - 17.0 g/dL   HCT 24.9 (L) 39.0 - 52.0 %   MCV 91.5 80.0 - 100.0 fL   MCH 28.7 26.0 - 34.0 pg   MCHC 31.3 30.0 -  36.0 g/dL   RDW 15.9 (H) 11.5 - 15.5 %   Platelets 291 150 - 400 K/uL   nRBC 0.0 0.0 - 0.2 %    Comment: Performed at [facility], [place] [address], [place], [place] [address]  Comprehensive metabolic panel     Status: Abnormal   Collection Time: 02/24/18  7:48 AM  Result Value Ref Range   Sodium 134 (L) 135 - 145 mmol/L   Potassium 4.8 3.5 - 5.1 mmol/L   Chloride 103 98 - 111 mmol/L   CO2 18 (L) 22 - 32 mmol/L   Glucose, Bld 132 (H) 70 - 99 mg/dL   BUN 122 (H) 8 - 23 mg/dL   Creatinine, Ser 10.60 (H) 0.61 - 1.24 mg/dL   Calcium 7.3 (L) 8.9 - 10.3 mg/dL   Total Protein 6.3 (L) 6.5 - 8.1 g/dL   Albumin 2.6 (L) 3.5 - 5.0 g/dL   AST 40 15 - 41 U/L   ALT 50 (H) 0 - 44 U/L   Alkaline Phosphatase 101 38 - 126 U/L   Total Bilirubin 0.4 0.3 - 1.2 mg/dL   GFR calc non Af Amer 5 (L) >60 mL/min   GFR calc Af Amer 5 (L) >60 mL/min   Anion gap 13 5 - 15    Comment: Performed at [facility], [place] [address] [place], [place] [address]  I-Stat Troponin, ED (not at [facility])     Status: None   Collection Time: 02/24/18  7:55 AM  Result Value Ref Range   Troponin i, poc 0.02 0.00 - 0.08 ng/mL   Comment 3            Comment: Due to the release kinetics of cTnI, a negative result within the first hours of the onset of symptoms does not rule out myocardial infarction with certainty. If myocardial infarction is still suspected, repeat the test at appropriate intervals.   POC occult blood, ED     Status: None   Collection Time: 02/24/18  9:02 AM  Result Value Ref Range   Fecal Occult Bld NEGATIVE NEGATIVE  Urine rapid drug screen (hosp performed)     Status: None   Collection Time: 02/24/18 10:43 AM  Result Value Ref Range   Opiates NONE DETECTED NONE DETECTED   Cocaine NONE DETECTED NONE DETECTED   Benzodiazepines NONE DETECTED NONE DETECTED   Amphetamines NONE DETECTED NONE DETECTED   Tetrahydrocannabinol NONE DETECTED NONE DETECTED   Barbiturates NONE DETECTED NONE DETECTED     Comment: (NOTE) DRUG  SCREEN FOR MEDICAL PURPOSES ONLY.  IF CONFIRMATION IS NEEDED FOR ANY PURPOSE, NOTIFY LAB WITHIN 5 DAYS. LOWEST DETECTABLE LIMITS FOR URINE DRUG SCREEN Drug Class                     Cutoff (ng/mL) Amphetamine and metabolites    1000 Barbiturate and metabolites    200 Benzodiazepine                 588 Tricyclics and metabolites     300 Opiates and metabolites        300 Cocaine and metabolites        300 THC                            50 Performed at [facility], [place] [address], [place], [place] [address]   Urinalysis, Routine w reflex microscopic     Status: Abnormal   Collection Time: 02/24/18 11:32 AM  Result Value Ref Range   Color, Urine STRAW (A) YELLOW   APPearance CLEAR CLEAR   Specific Gravity, Urine 1.011 1.005 - 1.030   pH 7.0 5.0 - 8.0   Glucose, UA 50 (A) NEGATIVE mg/dL   Hgb urine dipstick SMALL (A) NEGATIVE   Bilirubin Urine NEGATIVE NEGATIVE   Ketones, ur NEGATIVE NEGATIVE mg/dL   Protein, ur 100 (A) NEGATIVE mg/dL   Nitrite NEGATIVE NEGATIVE   Leukocytes, UA NEGATIVE NEGATIVE   RBC / HPF 0-5 0 - 5 RBC/hpf   WBC, UA 0-5 0 - 5 WBC/hpf   Bacteria, UA NONE SEEN NONE SEEN    Comment: Performed at [facility] [address], [place], [place] [address]    Dg Chest 2 View  Result Date: 02/24/2018 CLINICAL DATA:  Shortness of breath EXAM: CHEST - 2 VIEW COMPARISON:  01/15/2018 FINDINGS: Cardiac shadow is enlarged but stable. The lungs are well aerated without focal infiltrate or sizable effusion. Mild central vascular congestion is noted with mild interstitial edema. No bony abnormality is noted. IMPRESSION: Changes of mild CHF. Electronically Signed   By: Inez Catalina M.D.   On: 02/24/2018 07:34    Assessment/Plan 1.  ESRD, needing dialysis- fistula too young to use, placed 11/20.  Have contacted [facility] for [facility] placement.  Start HD after placement.  D/w pt, in agreement.  Will give Lasix 120 IV x 1 now  2.  HTN: on amlodipine 10  only- expect BP to improve even fruther with UF  3.  Anemia: will check iron and start ESA as appropriate  4.  BMD: check PTH, calcitriol with HD  5.  Dispo: will need CLIP  Tonja Jezewski 02/24/2018, 2:08 PM

## 2018-02-24 NOTE — ED Provider Notes (Addendum)
[facility] EMERGENCY DEPARTMENT Provider Note   CSN: [id number] Arrival date & time: 02/24/18  9390     History   Chief Complaint Chief Complaint  Patient presents with  . Shortness of Breath    HPI Jonathan Mcneil is a 62 y.o. male.  The history is provided by the patient.  Shortness of Breath  This is a new problem. The problem occurs continuously.The current episode started 2 days ago. The problem has been gradually worsening. Associated symptoms include cough, chest pain and leg swelling. Pertinent negatives include no fever, no headaches, no coryza, no rhinorrhea, no sore throat, no swollen glands, no ear pain, no sputum production, no PND, no orthopnea, no syncope, no vomiting, no abdominal pain, no rash and no leg pain. It is unknown what precipitated the problem. He has tried nothing for the symptoms. The treatment provided no relief. He has had prior hospitalizations. He has had prior ED visits. Associated medical issues comments: ckd.    Past Medical History:  Diagnosis Date  . Acute renal failure superimposed on stage 5 chronic kidney disease, not on chronic dialysis ([place])   . CKD (chronic kidney disease)   . Diabetes mellitus without complication ([place])   . Hypertension   . Low back pain   . Metabolic acidosis   . Neuromuscular disorder ([place])    peripheral neuropathy  . Pancreatitis   . Schizophrenia [facility])     Patient Active Problem List   Diagnosis Date Noted  . Metabolic acidosis 30/11/2328  . Hypertensive urgency 01/15/2018  . Type 2 diabetes mellitus with stage 4 chronic kidney disease ([place]) 01/15/2018  . Acute encephalopathy 02/15/2015  . CKD (chronic kidney disease) 02/14/2015  . Acute kidney injury superimposed on chronic kidney disease ([place]) 02/14/2015  . Hyponatremia 02/14/2015  . Back pain 02/14/2015  . Neuropathy 02/14/2015  . Obesity 02/14/2015  . Abdominal pain 02/14/2015  . Pancreatitis   . Diabetes mellitus without  complication ([place])   . Pancreatitis, acute   . Schizo-affective psychosis ([place]) 04/28/2013    Past Surgical History:  Procedure Laterality Date  . AV FISTULA PLACEMENT Left 02/11/2018   Procedure: INSERTION OF ARTERIOVENOUS (AV) FISTULA LEFT  ARM;  Surgeon: [name], MD;  Location: [facility];  Service: Vascular;  Laterality: Left;  . COLONOSCOPY    . DENTAL SURGERY          Home Medications    Prior to Admission medications   Medication Sig Start Date End Date Taking? Authorizing Provider  amLODipine (NORVASC) 10 MG tablet Take 10 mg by mouth daily. 09/24/17   [provider]  asenapine (SAPHRIS) 5 MG SUBL 24 hr tablet Place 5 mg under the tongue at bedtime.    [provider]  cyclobenzaprine (FLEXERIL) 5 MG tablet Take 5 mg by mouth 2 (two) times daily as needed for muscle spasms.  01/14/18   [provider]  gabapentin (NEURONTIN) 100 MG capsule Take 100 mg by mouth 3 (three) times daily as needed (hand numbness).    [provider]  lovastatin (MEVACOR) 20 MG tablet Take 20 mg by mouth daily.    [provider]  ondansetron (ZOFRAN ODT) 4 MG disintegrating tablet Take 1 tablet (4 mg total) by mouth every 8 (eight) hours as needed for nausea or vomiting. 01/16/18   Black, Lezlie Octave, NP  oxyCODONE-acetaminophen (PERCOCET) 5-325 MG tablet Take 1 tablet by mouth every 6 (six) hours as needed for severe pain. 02/11/18  Gabriel Earing, PA-C    Family History Family History  Problem Relation Age of Onset  . Kidney failure Mother     Social History Social History   Tobacco Use  . Smoking status: Former Research scientist (life sciences)  . Smokeless tobacco: Never Used  Substance Use Topics  . Alcohol use: No  . Drug use: Yes    Types: Cocaine, Marijuana    Comment: smoked marijuana 2 weeks ago.      Allergies   Ibuprofen and Penicillins   Review of Systems Review of Systems  Constitutional: Negative for chills and fever.  HENT:  Negative for ear pain, rhinorrhea and sore throat.   Eyes: Negative for pain and visual disturbance.  Respiratory: Positive for cough and shortness of breath. Negative for sputum production.   Cardiovascular: Positive for chest pain and leg swelling. Negative for palpitations, orthopnea, syncope and PND.  Gastrointestinal: Negative for abdominal pain and vomiting.  Genitourinary: Negative for dysuria and hematuria.  Musculoskeletal: Negative for arthralgias and back pain.  Skin: Negative for color change and rash.  Neurological: Negative for seizures, syncope and headaches.  All other systems reviewed and are negative.    Physical Exam Updated Vital Signs  ED Triage Vitals [02/24/18 0700]  Enc Vitals Group     BP (!) 167/86     Pulse Rate 86     Resp 20     Temp 98 F (36.7 C)     Temp Source Oral     SpO2 100 %     Weight      Height      Head Circumference      Peak Flow      Pain Score 7     Pain Loc      Pain Edu?      Excl. in [place]?     Physical Exam  Constitutional: He appears well-developed and well-nourished.  HENT:  Head: Normocephalic and atraumatic.  Eyes: Pupils are equal, round, and reactive to light. Conjunctivae and EOM are normal.  Neck: Normal range of motion. Neck supple.  Cardiovascular: Normal rate and regular rhythm.  No murmur heard. Pulmonary/Chest: Effort normal. No respiratory distress. He has decreased breath sounds. He has no wheezes. He has no rhonchi. He has rales.  Abdominal: Soft. Bowel sounds are normal. There is no tenderness.  Musculoskeletal:       Right lower leg: He exhibits edema.       Left lower leg: He exhibits edema.  Neurological: He is alert.  Skin: Skin is warm and dry.  Psychiatric: He has a normal mood and affect.  Nursing note and vitals reviewed.    ED Treatments / Results  Labs (all labs ordered are listed, but only abnormal results are displayed) Labs Reviewed  CBC - Abnormal; Notable for the following  components:      Result Value   RBC 2.72 (*)    Hemoglobin 7.8 (*)    HCT 24.9 (*)    RDW 15.9 (*)    All other components within normal limits  COMPREHENSIVE METABOLIC PANEL - Abnormal; Notable for the following components:   Sodium 134 (*)    CO2 18 (*)    Glucose, Bld 132 (*)    BUN 122 (*)    Creatinine, Ser 10.60 (*)    Calcium 7.3 (*)    Total Protein 6.3 (*)    Albumin 2.6 (*)    ALT 50 (*)    GFR calc non Af Amer 5 (*)  GFR calc Af Amer 5 (*)    All other components within normal limits  BRAIN NATRIURETIC PEPTIDE  I-STAT TROPONIN, ED  POC OCCULT BLOOD, ED    EKG EKG Interpretation  Date/Time:  Tuesday February 24 2018 07:01:05 EST Ventricular Rate:  94 PR Interval:  136 QRS Duration: 84 QT Interval:  378 QTC Calculation: 472 R Axis:   41 Text Interpretation:  Normal sinus rhythm Nonspecific ST abnormality Abnormal ECG Confirmed by Lennice Sites [phone]) on 02/24/2018 7:08:50 AM   Radiology Dg Chest 2 View  Result Date: 02/24/2018 CLINICAL DATA:  Shortness of breath EXAM: CHEST - 2 VIEW COMPARISON:  01/15/2018 FINDINGS: Cardiac shadow is enlarged but stable. The lungs are well aerated without focal infiltrate or sizable effusion. Mild central vascular congestion is noted with mild interstitial edema. No bony abnormality is noted. IMPRESSION: Changes of mild CHF. Electronically Signed   By: Inez Catalina M.D.   On: 02/24/2018 07:34    Procedures .Critical Care Performed by: Lennice Sites, DO Authorized by: Lennice Sites, DO   Critical care provider statement:    Critical care time (minutes):  35   Critical care time was exclusive of:  Separately billable procedures and treating other patients and teaching time   Critical care was necessary to treat or prevent imminent or life-threatening deterioration of the following conditions:  Renal failure   Critical care was time spent personally by me on the following activities:  Development of treatment plan with  patient or surrogate, discussions with consultants, discussions with primary provider, evaluation of patient's response to treatment, examination of patient, ordering and performing treatments and interventions, ordering and review of laboratory studies, ordering and review of radiographic studies, pulse oximetry, re-evaluation of patient's condition and review of old charts   (including critical care time)  Medications Ordered in ED Medications - No data to display   Initial Impression / Assessment and Plan / ED Course  I have reviewed the triage vital signs and the nursing notes.  Pertinent labs & imaging results that were available during my care of the patient were reviewed by me and considered in my medical decision making (see chart for details).     Jonathan Mcneil is a 62 year old male with history of diabetes, hypertension, schizophrenia, CKD who presents to the ED with shortness of breath, fatigue.  Patient with overall unremarkable vitals.  No fever.  Patient with progressive shortness of breath, fatigue over the last several days.  Recently had an AV fistula placed as he is likely to begin dialysis.  Has no history of heart failure.  Patient with signs of volume overload on exam.  Peripheral edema and rales on exam.  Chest x-ray shows mild congestion but no pneumonia.  Patient with worsening creatinine, BUN suggestive of worsening renal disease.  Hemoglobin 7.6 but patient without melena or hematochezia on rectal exam.  Suspect likely from volume overload/chronic kidney disease.  Patient otherwise with no significant leukocytosis.  Potassium within normal limits.  Patient with multiple metabolic derangements that are likely causing volume overload, fatigue.  Will benefit from optimization.  Bicarb is 18.  Contacted Dr. Posey Pronto with nephrology due to concern for worsening uremic symptoms and he recommends admission.  Hospitalist to admit the patient and nephrology to come by to the  recommendations.  Hemodynamically stable throughout my care.  This chart was dictated using voice recognition software.  Despite best efforts to proofread,  errors can occur which can change the documentation meaning.   Final  Clinical Impressions(s) / ED Diagnoses   Final diagnoses:  AKI (acute kidney injury) ([place])  SOB (shortness of breath)  Hypervolemia, unspecified hypervolemia type    ED Discharge Orders    None       Lennice Sites, DO 02/24/18 [place], [place], DO 03/06/18 1724

## 2018-02-24 NOTE — H&P (Signed)
History and Physical    Jonathan Mcneil YQI:[id number] DOB: 24-Aug-1955 DOA: 02/24/2018  PCP: Merrilee Seashore, MD Consultants:  Donzetta Matters - vascular; Jacqualyn Posey - podiatry; Johnney Ou - nephrology Patient coming from:  Home - lives with wife; NOK: Wife, [phone]  Chief Complaint: SOB  HPI: Jonathan Mcneil is a 62 y.o. male with medical history significant of  Schizophrenia; HTN; DM; and ESRD not yet on HD.  He presented today with SOB x 3 days.  He notices SOB when he eats too much food.  +cough, productive of white phlegm.  He does not think he overate for the holiday and does not know if he ate too much salt.  +weak/tired.  +N/V intermittently.  Fistula was placed 2 weeks ago.  Dr. Johnney Ou told him if he lost appetite again they might have to put him on HD; he is hungry but he can't eat like he used to because it makes him sick and unable to breathe.   ED Course:  Spoke with Dr. Posey Pronto from nephrology, recent AV fistula placement.  Coming in with worsening uremia, rales on exam, edema, CXR with edema.  Nephrology to see.  Review of Systems: As per HPI; otherwise review of systems reviewed and negative.   Ambulatory Status:  Ambulates without assistance  Past Medical History:  Diagnosis Date  . Diabetes mellitus without complication ([place])   . ESRD (end stage renal disease) ([place])   . Hypertension   . Low back pain   . Metabolic acidosis   . Neuromuscular disorder ([place])    peripheral neuropathy  . Pancreatitis   . Schizophrenia ([place])    does not take medications    Past Surgical History:  Procedure Laterality Date  . AV FISTULA PLACEMENT Left 02/11/2018   Procedure: INSERTION OF ARTERIOVENOUS (AV) FISTULA LEFT  ARM;  Surgeon: [name], MD;  Location: [facility];  Service: Vascular;  Laterality: Left;  . COLONOSCOPY    . DENTAL SURGERY      Social History   Socioeconomic History  . Marital status: Married    Spouse name: Not on file  . Number of children: Not  on file  . Years of education: Not on file  . Highest education level: Not on file  Occupational History  . Occupation: Mudlogger  Social Needs  . Financial resource strain: Not on file  . Food insecurity:    Worry: Not on file    Inability: Not on file  . Transportation needs:    Medical: Not on file    Non-medical: Not on file  Tobacco Use  . Smoking status: Former Research scientist (life sciences)  . Smokeless tobacco: Never Used  Substance and Sexual Activity  . Alcohol use: No  . Drug use: Yes    Types: Cocaine, Marijuana    Comment: smoked marijuana a few days ago; he denies using cocaine  . Sexual activity: Not on file  Lifestyle  . Physical activity:    Days per week: Not on file    Minutes per session: Not on file  . Stress: Not on file  Relationships  . Social connections:    Talks on phone: Not on file    Gets together: Not on file    Attends religious service: Not on file    Active member of club or organization: Not on file    Attends meetings of clubs or organizations: Not on file    Relationship status: Not on file  . Intimate partner violence:  Fear of current or ex partner: Not on file    Emotionally abused: Not on file    Physically abused: Not on file    Forced sexual activity: Not on file  Other Topics Concern  . Not on file  Social History Narrative  . Not on file    Allergies  Allergen Reactions  . Ibuprofen Other (See Comments)    UNSPECIFIED REACTION  Pt states he was told not to take it.  Marland Kitchen Penicillins Itching    Has patient had a PCN reaction causing immediate rash, facial/tongue/throat swelling, SOB or lightheadedness with hypotension: No Has patient had a PCN reaction causing severe rash involving mucus membranes or skin necrosis: No Has patient had a PCN reaction that required hospitalization No Has patient had a PCN reaction occurring within the last 10 years: Unknown If all of the above answers are "NO", then may proceed with Cephalosporin  use.    Family History  Problem Relation Age of Onset  . Kidney failure Mother     Prior to Admission medications   Medication Sig Start Date End Date Taking? Authorizing Provider  amLODipine (NORVASC) 10 MG tablet Take 10 mg by mouth daily. 09/24/17  Yes [provider]  gabapentin (NEURONTIN) 100 MG capsule Take 100 mg by mouth 3 (three) times daily as needed (hand numbness).   Yes [provider]  lovastatin (MEVACOR) 20 MG tablet Take 20 mg by mouth daily.   Yes [provider]  ondansetron (ZOFRAN ODT) 4 MG disintegrating tablet Take 1 tablet (4 mg total) by mouth every 8 (eight) hours as needed for nausea or vomiting. 01/16/18  Yes Black, Lezlie Octave, NP  oxyCODONE-acetaminophen (PERCOCET) 5-325 MG tablet Take 1 tablet by mouth every 6 (six) hours as needed for severe pain. Patient not taking: Reported on 02/24/2018 02/11/18   Gabriel Earing, PA-C    Physical Exam: Vitals:   02/24/18 1030 02/24/18 1045 02/24/18 1100 02/24/18 1115  BP: 134/80 133/87 138/85 132/84  Pulse:      Resp: (!) 26 (!) 21 (!) 22 (!) 21  Temp:      TempSrc:      SpO2:         General:  Appears fatigued and chronically ill Eyes:  PERRL, EOMI, normal lids, iris ENT:  grossly normal hearing, lips & tongue, mmm; poor dentition Neck:  no LAD, masses or thyromegaly Cardiovascular:  RRR, no m/r/g. 2+ taut LE edema.  Respiratory:   CTA bilaterally with no wheezes/rales/rhonchi.  Normal respiratory effort. Abdomen:  soft, NT, ND, NABS Back:   normal alignment, no CVAT Skin:  Stasis dermatitis of the B LE Musculoskeletal:  grossly normal tone BUE/BLE, good ROM, no bony abnormality Lower extremity:  No LE edema.  Limited foot exam with no ulcerations.  2+ distal pulses. Psychiatric:  grossly normal mood and affect, speech fluent and appropriate, AOx3 Neurologic:  CN 2-12 grossly intact, moves all extremities in coordinated fashion, sensation intact    Radiological Exams on  Admission: Dg Chest 2 View  Result Date: 02/24/2018 CLINICAL DATA:  Shortness of breath EXAM: CHEST - 2 VIEW COMPARISON:  01/15/2018 FINDINGS: Cardiac shadow is enlarged but stable. The lungs are well aerated without focal infiltrate or sizable effusion. Mild central vascular congestion is noted with mild interstitial edema. No bony abnormality is noted. IMPRESSION: Changes of mild CHF. Electronically Signed   By: Inez Catalina M.D.   On: 02/24/2018 07:34    EKG: Independently reviewed.  NSR with  rate 94; nonspecific ST changes with no evidence of acute ischemia   Labs on Admission: I have personally reviewed the available labs and imaging studies at the time of the admission.  Pertinent labs:   CO2 18 Glucose 132 BUN 122/Creatinine 10.60/GFR 5; 77/8.16/7 Albumin 2.6 BNP 621.1 Troponin 0.02 WBC 8.8 Hgb 7.8; 10.5 on 10/25 Heme negative  Assessment/Plan Principal Problem:   Uremia Active Problems:   Diabetes mellitus without complication (HCC)   Type 2 diabetes mellitus with stage 4 chronic kidney disease (HCC)   Metabolic acidosis   ESRD (end stage renal disease) (HCC)   Hypertension   Schizophrenia ([place])   History of anemia due to CKD   Marijuana abuse   Uremia, ESRD, metabolic acidosis -Patient with known progressive CKD -Recent placement of fistula in anticipation of impending need for HD -He has had progressive symptoms c/w uremia -Labs show marked ongoing worsening of renal function with acidosis -While bicarb and medical management may continue to provide a bridge, he appears to have approached the need for HD -Nephrology consult is pending -Nephrology prn order set was used -His fistula was recently placed in his left arm but I did not appreciate a thrill on palpation today -He appears likely to need a temporary HD catheter if HD is indicated -He appears likely to need serial HD if HD is initiated during this hospitalization and so he was admitted to inpatient  status  HTN -Continue Norvasc -His BP is likely to improve if HD is initiated  DM -Recent A1c was 6.6 -He is not on medications at this time -He may benefit from oral therapy for long-time DM control -However, at this time his glucose is controlled enough for it to be reasonable not to provide SSI  Schizophrenia -He is not taking medication for this -By his description, he only needs medication if he is actively suicidal or homicidal, and he denies this at this time  Anemia -This is significantly worse than prior, but appears more likely related to progression of renal disease -Will monitor to ensure stability -If worsening, consider GI evaluation  Marijuana abuse -Cessation encouraged; this should be encouraged on an ongoing basis -UDS ordered   DVT prophylaxis:  Lovenox  Code Status: DNR - confirmed with patient Family Communication: None present Disposition Plan:  Home once clinically improved Consults called: Nephrology  Admission status: Admit - It is my clinical opinion that admission to INPATIENT is reasonable and necessary because of the expectation that this patient will require hospital care that crosses at least 2 midnights to treat this condition based on the medical complexity of the problems presented.  Given the aforementioned information, the predictability of an adverse outcome is felt to be significant.    Karmen Bongo MD [facility]  If note is complete, please contact covering daytime or nighttime physician. [email or web address] Password [facility]  02/24/2018, 11:39 AM

## 2018-02-24 NOTE — Progress Notes (Addendum)
    HPI: 62 y/o male with CKD stage 5 was last seen by Dr. Donzetta Matters on 02/11/2018.  He placed a first stage basilic fistula in the left UE.  He has a scheduled appt. For follow up duplex and second stage planning on 04/03/2018.  We have now been asked to place a TDC to start HD now secondary to symptoms and now in ESRD.    Subjective  - He presented to the ED with edema and SOB.   Objective 132/84 94 98 F (36.7 C) (Oral) (!) 21 98% No intake or output data in the 24 hours ending 02/24/18 1438  Left arm incision well healed, palpable anastomosis thrill and radial pulse. Motor intact, sensation intact without pain in the left UE Heart RRR Lungs non labored breathing at rest  Assessment/Planning: ESRD s/p first left stage basilic fistula creation.  We will schedule him for [facility] and he can keep his future f/u appt for duplex and second stage bsilic fistula check.  Once the fistula has matured the [facility] can be removed.   He just urinated 500 cc, with a Cr of 10.60, and just finished eating full meal.    Roxy Horseman 02/24/2018 2:38 PM --  Laboratory Lab Results: Recent Labs    02/24/18 0748  WBC 8.8  HGB 7.8*  HCT 24.9*  PLT 291   BMET Recent Labs    02/24/18 0748  NA 134*  K 4.8  CL 103  CO2 18*  GLUCOSE 132*  BUN 122*  CREATININE 10.60*  CALCIUM 7.3*    COAG No results found for: INR, PROTIME No results found for: PTT  I have independently interviewed and examined patient and agree with PA assessment and plan above.  He has a functional left arm basilic vein for stage fistula will need to be elevated in the future.  Unfortunately patient just finished eating.  We will plan tunneled dialysis catheter tomorrow in the OR.  N.p.o. past midnight.  Kaitlin Alcindor C. Donzetta Matters, MD [facility] [place] Office: [phone] Pager: [phone]

## 2018-02-24 NOTE — ED Triage Notes (Signed)
Pt states that he has had increased SOB over the past two days, swelling in lower legs with +1 pitting edema, pt has new AV fistula but has not started dialysis yet.

## 2018-02-25 ENCOUNTER — Encounter (HOSPITAL_COMMUNITY): Payer: Self-pay | Admitting: Orthopedic Surgery

## 2018-02-25 ENCOUNTER — Inpatient Hospital Stay (HOSPITAL_COMMUNITY): Payer: Medicare Other

## 2018-02-25 ENCOUNTER — Encounter (HOSPITAL_COMMUNITY)
Admission: EM | Disposition: A | Discharge status: Home / Self Care | Payer: Self-pay | Source: Home / Self Care | Attending: Internal Medicine

## 2018-02-25 ENCOUNTER — Inpatient Hospital Stay (HOSPITAL_COMMUNITY): Payer: Medicare Other | Admitting: Anesthesiology

## 2018-02-25 DIAGNOSIS — N185 Chronic kidney disease, stage 5: Secondary | ICD-10-CM

## 2018-02-25 HISTORY — PX: INSERTION OF DIALYSIS CATHETER: SHX1324

## 2018-02-25 LAB — RENAL FUNCTION PANEL
Albumin: 2.4 g/dL — ABNORMAL LOW (ref 3.5–5.0)
Anion gap: 14 (ref 5–15)
BUN: 122 mg/dL — ABNORMAL HIGH (ref 8–23)
CO2: 18 mmol/L — ABNORMAL LOW (ref 22–32)
Calcium: 7.6 mg/dL — ABNORMAL LOW (ref 8.9–10.3)
Chloride: 106 mmol/L (ref 98–111)
Creatinine, Ser: 10.61 mg/dL — ABNORMAL HIGH (ref 0.61–1.24)
GFR calc Af Amer: 5 mL/min — ABNORMAL LOW (ref >60)
GFR calc non Af Amer: 5 mL/min — ABNORMAL LOW (ref >60)
Glucose, Bld: 106 mg/dL — ABNORMAL HIGH (ref 70–99)
PHOSPHORUS: 6 mg/dL — AB (ref 2.5–4.6)
Potassium: 4.8 mmol/L (ref 3.5–5.1)
Sodium: 138 mmol/L (ref 135–145)

## 2018-02-25 LAB — CBC
HCT: 24.1 % — ABNORMAL LOW (ref 39.0–52.0)
HEMOGLOBIN: 7.8 g/dL — AB (ref 13.0–17.0)
MCH: 28.8 pg (ref 26.0–34.0)
MCHC: 32.4 g/dL (ref 30.0–36.0)
MCV: 88.9 fL (ref 80.0–100.0)
Platelets: 289 10*3/uL (ref 150–400)
RBC: 2.71 MIL/uL — ABNORMAL LOW (ref 4.22–5.81)
RDW: 15.6 % — ABNORMAL HIGH (ref 11.5–15.5)
WBC: 6.8 10*3/uL (ref 4.0–10.5)
nRBC: 0 % (ref 0.0–0.2)

## 2018-02-25 LAB — SURGICAL PCR SCREEN
MRSA, PCR: NEGATIVE
Staphylococcus aureus: NEGATIVE

## 2018-02-25 LAB — IRON AND TIBC
Iron: 32 ug/dL — ABNORMAL LOW (ref 45–182)
Saturation Ratios: 12 % — ABNORMAL LOW (ref 17.9–39.5)
TIBC: 270 ug/dL (ref 250–450)
UIBC: 238 ug/dL

## 2018-02-25 LAB — FERRITIN: Ferritin: 366 ng/mL — ABNORMAL HIGH (ref 24–336)

## 2018-02-25 LAB — GLUCOSE, CAPILLARY: Glucose-Capillary: 107 mg/dL — ABNORMAL HIGH (ref 70–99)

## 2018-02-25 SURGERY — INSERTION OF DIALYSIS CATHETER
Anesthesia: General | Site: Chest | Laterality: Right

## 2018-02-25 MED ORDER — PHENYLEPHRINE HCL 10 MG/ML IJ SOLN
INTRAMUSCULAR | Status: DC | PRN
  Administered 2018-02-25: 80 ug via INTRAVENOUS

## 2018-02-25 MED ORDER — PROMETHAZINE HCL 25 MG/ML IJ SOLN: 6.2500 mg | INTRAMUSCULAR | Status: DC | PRN

## 2018-02-25 MED ORDER — ONDANSETRON HCL 4 MG/2ML IJ SOLN
INTRAMUSCULAR | Status: DC | PRN
  Administered 2018-02-25: 4 mg via INTRAVENOUS

## 2018-02-25 MED ORDER — SODIUM CHLORIDE 0.9 % IV SOLN
125.0000 mg | INTRAVENOUS | Status: DC
  Administered 2018-02-26 – 2018-02-27 (×3): 125 mg via INTRAVENOUS
  Filled 2018-02-25 (×3): qty 10

## 2018-02-25 MED ORDER — CEFAZOLIN SODIUM-DEXTROSE 2-4 GM/100ML-% IV SOLN
INTRAVENOUS | Status: AC
  Filled 2018-02-25: qty 100

## 2018-02-25 MED ORDER — DEXAMETHASONE SODIUM PHOSPHATE 4 MG/ML IJ SOLN
INTRAMUSCULAR | Status: DC | PRN
  Administered 2018-02-25: 5 mg via INTRAVENOUS

## 2018-02-25 MED ORDER — MIDAZOLAM HCL 2 MG/2ML IJ SOLN
INTRAMUSCULAR | Status: AC
  Filled 2018-02-25: qty 2

## 2018-02-25 MED ORDER — DIAZEPAM 5 MG/ML IJ SOLN
5.0000 mg | Freq: Once | INTRAMUSCULAR | Status: AC
  Administered 2018-02-25: 5 mg via INTRAVENOUS
  Filled 2018-02-25: qty 2

## 2018-02-25 MED ORDER — HEPARIN SODIUM (PORCINE) 1000 UNIT/ML IJ SOLN
INTRAMUSCULAR | Status: AC
  Filled 2018-02-25: qty 1

## 2018-02-25 MED ORDER — LIDOCAINE-EPINEPHRINE (PF) 1 %-1:200000 IJ SOLN
INTRAMUSCULAR | Status: AC
  Filled 2018-02-25: qty 30

## 2018-02-25 MED ORDER — SODIUM CHLORIDE 0.9 % IV SOLN
INTRAVENOUS | Status: DC
  Administered 2018-02-25: 10:00:00 via INTRAVENOUS

## 2018-02-25 MED ORDER — 0.9 % SODIUM CHLORIDE (POUR BTL) OPTIME
TOPICAL | Status: DC | PRN
  Administered 2018-02-25: 1000 mL

## 2018-02-25 MED ORDER — FENTANYL CITRATE (PF) 250 MCG/5ML IJ SOLN
INTRAMUSCULAR | Status: AC
  Filled 2018-02-25: qty 5

## 2018-02-25 MED ORDER — DIAZEPAM 5 MG PO TABS
5.0000 mg | ORAL_TABLET | Freq: Once | ORAL | Status: AC
  Administered 2018-02-25: 5 mg via ORAL
  Filled 2018-02-25: qty 1

## 2018-02-25 MED ORDER — PROPOFOL 10 MG/ML IV BOLUS
INTRAVENOUS | Status: DC | PRN
  Administered 2018-02-25: 100 mg via INTRAVENOUS

## 2018-02-25 MED ORDER — CEFAZOLIN SODIUM-DEXTROSE 2-4 GM/100ML-% IV SOLN: 2.0000 g | Freq: Once | INTRAVENOUS | Status: DC

## 2018-02-25 MED ORDER — LIDOCAINE-EPINEPHRINE (PF) 1 %-1:200000 IJ SOLN
INTRAMUSCULAR | Status: DC | PRN
  Administered 2018-02-25: 10 mL

## 2018-02-25 MED ORDER — PHENYLEPHRINE HCL 10 MG/ML IJ SOLN
INTRAMUSCULAR | Status: AC
  Filled 2018-02-25: qty 1

## 2018-02-25 MED ORDER — SODIUM CHLORIDE 0.9 % IV SOLN
INTRAVENOUS | Status: AC
  Filled 2018-02-25: qty 1.2

## 2018-02-25 MED ORDER — FENTANYL CITRATE (PF) 100 MCG/2ML IJ SOLN: 25.0000 ug | INTRAMUSCULAR | Status: DC | PRN

## 2018-02-25 MED ORDER — SODIUM CHLORIDE 0.9 % IV SOLN
INTRAVENOUS | Status: DC | PRN
  Administered 2018-02-25: 500 mL

## 2018-02-25 MED ORDER — LIDOCAINE 2% (20 MG/ML) 5 ML SYRINGE
INTRAMUSCULAR | Status: DC | PRN
  Administered 2018-02-25: 60 mg via INTRAVENOUS

## 2018-02-25 MED ORDER — DARBEPOETIN ALFA 60 MCG/0.3ML IJ SOSY
60.0000 ug | PREFILLED_SYRINGE | INTRAMUSCULAR | Status: DC
  Administered 2018-02-26: 60 ug via INTRAVENOUS
  Filled 2018-02-25: qty 0.3

## 2018-02-25 MED ORDER — FENTANYL CITRATE (PF) 100 MCG/2ML IJ SOLN
INTRAMUSCULAR | Status: DC | PRN
  Administered 2018-02-25: 50 ug via INTRAVENOUS

## 2018-02-25 MED ORDER — MIDAZOLAM HCL 5 MG/5ML IJ SOLN
INTRAMUSCULAR | Status: DC | PRN
  Administered 2018-02-25: 2 mg via INTRAVENOUS

## 2018-02-25 MED ORDER — HEPARIN SODIUM (PORCINE) 1000 UNIT/ML IJ SOLN
INTRAMUSCULAR | Status: DC | PRN
  Administered 2018-02-25: 3000 [IU]

## 2018-02-25 MED ORDER — PROPOFOL 10 MG/ML IV BOLUS
INTRAVENOUS | Status: AC
  Filled 2018-02-25: qty 20

## 2018-02-25 MED ORDER — SODIUM CHLORIDE 0.9 % IV SOLN
INTRAVENOUS | Status: DC | PRN
  Administered 2018-02-25: 40 ug/min via INTRAVENOUS

## 2018-02-25 SURGICAL SUPPLY — 41 items
ADH SKN CLS APL DERMABOND .7 (GAUZE/BANDAGES/DRESSINGS) ×1
BAG DECANTER FOR FLEXI CONT (MISCELLANEOUS) ×3 IMPLANT
BIOPATCH RED 1 DISK 7.0 (GAUZE/BANDAGES/DRESSINGS) ×2 IMPLANT
BIOPATCH RED 1IN DISK 7.0MM (GAUZE/BANDAGES/DRESSINGS) ×1
CATH PALINDROME RT-P 15FX19CM (CATHETERS) ×2 IMPLANT
CATH PALINDROME RT-P 15FX23CM (CATHETERS) IMPLANT
CATH PALINDROME RT-P 15FX28CM (CATHETERS) IMPLANT
CATH PALINDROME RT-P 15FX55CM (CATHETERS) IMPLANT
COVER PROBE W GEL 5X96 (DRAPES) ×3 IMPLANT
COVER SURGICAL LIGHT HANDLE (MISCELLANEOUS) ×3 IMPLANT
COVER WAND RF STERILE (DRAPES) ×3 IMPLANT
DERMABOND ADVANCED (GAUZE/BANDAGES/DRESSINGS) ×2
DERMABOND ADVANCED .7 DNX12 (GAUZE/BANDAGES/DRESSINGS) ×1 IMPLANT
DRAPE C-ARM 42X72 X-RAY (DRAPES) ×3 IMPLANT
DRAPE CHEST BREAST 15X10 FENES (DRAPES) ×3 IMPLANT
GAUZE 4X4 16PLY RFD (DISPOSABLE) ×3 IMPLANT
GLOVE BIO SURGEON STRL SZ7.5 (GLOVE) ×3 IMPLANT
GOWN STRL REUS W/ TWL LRG LVL3 (GOWN DISPOSABLE) ×1 IMPLANT
GOWN STRL REUS W/ TWL XL LVL3 (GOWN DISPOSABLE) ×1 IMPLANT
GOWN STRL REUS W/TWL LRG LVL3 (GOWN DISPOSABLE) ×3
GOWN STRL REUS W/TWL XL LVL3 (GOWN DISPOSABLE) ×3
KIT BASIN OR (CUSTOM PROCEDURE TRAY) ×3 IMPLANT
KIT TURNOVER KIT B (KITS) ×3 IMPLANT
NDL 18GX1X1/2 (RX/OR ONLY) (NEEDLE) ×1 IMPLANT
NDL HYPO 25GX1X1/2 BEV (NEEDLE) ×1 IMPLANT
NEEDLE 18GX1X1/2 (RX/OR ONLY) (NEEDLE) ×3 IMPLANT
NEEDLE HYPO 25GX1X1/2 BEV (NEEDLE) ×3 IMPLANT
NS IRRIG 1000ML POUR BTL (IV SOLUTION) ×3 IMPLANT
PACK SURGICAL SETUP 50X90 (CUSTOM PROCEDURE TRAY) ×3 IMPLANT
PAD ARMBOARD 7.5X6 YLW CONV (MISCELLANEOUS) ×6 IMPLANT
SOAP 2 % CHG 4 OZ (WOUND CARE) ×3 IMPLANT
SUT ETHILON 3 0 PS 1 (SUTURE) ×3 IMPLANT
SUT MNCRL AB 4-0 PS2 18 (SUTURE) ×3 IMPLANT
SYR 10ML LL (SYRINGE) ×3 IMPLANT
SYR 20CC LL (SYRINGE) ×6 IMPLANT
SYR 5ML LL (SYRINGE) ×3 IMPLANT
SYR CONTROL 10ML LL (SYRINGE) ×3 IMPLANT
TOWEL GREEN STERILE (TOWEL DISPOSABLE) ×3 IMPLANT
TOWEL GREEN STERILE FF (TOWEL DISPOSABLE) ×3 IMPLANT
TOWEL OR 17X26 4PK STRL BLUE (TOWEL DISPOSABLE) ×3 IMPLANT
WATER STERILE IRR 1000ML POUR (IV SOLUTION) ×3 IMPLANT

## 2018-02-25 NOTE — Op Note (Signed)
    Patient name: Jonathan Mcneil MRN: [id number] DOB: [birth year] Sex: male  02/25/2018 Pre-operative Diagnosis: End-stage renal disease Post-operative diagnosis:  Same Surgeon:  Erlene Quan C. Donzetta Matters, MD Procedure Performed: Right IJ 19 cm tunneled dialysis catheter placement with ultrasound guidance  Indications: 62 year old male is undergone left arm for stage basilic vein fistula.  He is now indicated for tunneled catheter for urgent dialysis.  Findings: IJ was patent.  I completion the catheter was placed to the SVC atrial junction   Procedure:  The patient was identified in the holding area and taken to the operating room where is placed supine operative table and general anesthesia was induced.  He was sterilely prepped and draped in the bilateral neck and chest areas, antibiotics were administered, a timeout was called.  We began with ultrasound-guided evaluation of the right IJ which was noted to be patent and compressible.  This was cannulated with an 18-gauge needle and a wire was placed centrally.  A counterincision was made in an 19 cm catheter was tunneled.  The wire tract was serially dilated and introducer sheath was placed under fluoroscopic guidance.  Catheter was placed to the SVC atrial junction.  It was assembled flushed with heparinized saline and locked with concentrated heparin 1.5 cc in either port.  It was affixed to skin with 3-0 nylon suture and the neck incision closed with 4-0 Monocryl.  Dermabond was placed at both sites and a sterile dressing placed.  He was then awake from anesthesia having tolerated procedure without immediate comp occasion.  All counts were correct at completion.  EBL 10 cc    [name] C. Donzetta Matters, MD [facility] [place] Office: [phone] Pager: [phone]

## 2018-02-25 NOTE — Anesthesia Postprocedure Evaluation (Signed)
Anesthesia Post Note  Patient: Jonathan Mcneil  Procedure(s) Performed: INSERTION OF DIALYSIS CATHETER (Right Chest)     Patient location during evaluation: PACU Anesthesia Type: General Level of consciousness: sedated Pain management: pain level controlled Vital Signs Assessment: post-procedure vital signs reviewed and stable Respiratory status: spontaneous breathing and respiratory function stable Cardiovascular status: stable Postop Assessment: no apparent nausea or vomiting Anesthetic complications: no    Last Vitals:  Vitals:   02/25/18 1150 02/25/18 1216  BP: (!) 141/87 (!) 150/88  Pulse: 88 84  Resp: 12 15  Temp: 36.6 C 36.6 C  SpO2: 100% 96%    Last Pain:  Vitals:   02/25/18 1216  TempSrc: Oral  PainSc:                  Brisa Auth,Kristan DANIEL

## 2018-02-25 NOTE — Progress Notes (Signed)
Patient transferred back to 2W34, receiving RN at bedside, no questions or concerns.  Rowe Pavy, RN

## 2018-02-25 NOTE — Transfer of Care (Signed)
Immediate Anesthesia Transfer of Care Note  Patient: Jonathan Mcneil  Procedure(s) Performed: INSERTION OF DIALYSIS CATHETER (Right Chest)  Patient Location: PACU  Anesthesia Type:General  Level of Consciousness: drowsy  Airway & Oxygen Therapy: Patient Spontanous Breathing and Patient connected to nasal cannula oxygen  Post-op Assessment: Report given to RN and Post -op Vital signs reviewed and stable  Post vital signs: Reviewed and stable  Last Vitals:  Vitals Value Taken Time  BP    Temp    Pulse    Resp    SpO2      Last Pain:  Vitals:   02/24/18 2140  TempSrc:   PainSc: Asleep      Patients Stated Pain Goal: 0 (28/78/67 6720)  Complications: No apparent anesthesia complications

## 2018-02-25 NOTE — Anesthesia Procedure Notes (Signed)
Procedure Name: LMA Insertion Date/Time: 02/25/2018 10:40 AM Performed by: Duane Boston, MD Pre-anesthesia Checklist: Patient identified, Emergency Drugs available, Suction available and Patient being monitored Patient Re-evaluated:Patient Re-evaluated prior to induction Oxygen Delivery Method: Circle System Utilized Preoxygenation: Pre-oxygenation with 100% oxygen Induction Type: IV induction Ventilation: Mask ventilation without difficulty LMA: LMA inserted LMA Size: 5.0 Number of attempts: 1 Placement Confirmation: positive ETCO2 Tube secured with: Tape Dental Injury: Teeth and Oropharynx as per pre-operative assessment  Comments: LMA inserted by Norm Salt, SRNA.

## 2018-02-25 NOTE — Progress Notes (Signed)
  [facility] Progress Note   Assessment/ Plan:   1. ESRD, needing dialysis- fistula too young to use, placed 11/20.  Have contacted [facility] for [facility] placement, will occur today.  For HD #1 today after placement.    2.  HTN: on amlodipine 10 only- expect BP to improve even fruther with UF  3.  Anemia: % sat 12, will start iron with HD (full load) and give Aranesp today 60 mcg  4.  BMD: check PTH, calcitriol with HD  5.  Dispo: will need CLIP  Subjective:    For [facility] and then HD #1 this AM.  Breathing better s/p 120 IV Lasix   Objective:   BP (!) 142/87   Pulse 87   Temp 98.2 F (36.8 C)   Resp 15   SpO2 99%   Physical Exam: GEN NAD, lying flat in bed HEENT EOMI PERRL NECK JVD improved PULM normal WOB, bibasilar crackles present but improved CV RRR ABD distended EXT 2+ LE edema, improved NEURO AAO x 3 ACCESS: LUE AVF + T/B Labs: BMET Recent Labs  Lab 02/24/18 0748 02/25/18 0230  NA 134* 138  K 4.8 4.8  CL 103 106  CO2 18* 18*  GLUCOSE 132* 106*  BUN 122* 122*  CREATININE 10.60* 10.61*  CALCIUM 7.3* 7.6*  PHOS  --  6.0*   CBC Recent Labs  Lab 02/24/18 0748 02/25/18 0230  WBC 8.8 6.8  HGB 7.8* 7.8*  HCT 24.9* 24.1*  MCV 91.5 88.9  PLT 291 289    @IMGRELPRIORS @ Medications:    . [MAR Hold] amLODipine  10 mg Oral Daily  . [MAR Hold] Chlorhexidine Gluconate Cloth  6 each Topical Q0600  . [MAR Hold] enoxaparin (LOVENOX) injection  30 mg Subcutaneous Q24H  . [MAR Hold] mupirocin ointment  1 application Nasal BID  . [MAR Hold] pneumococcal 23 valent vaccine  0.5 mL Intramuscular Tomorrow-1000  . [MAR Hold] pravastatin  20 mg Oral q1800     Madelon Lips, MD 02/25/2018, 10:05 AM

## 2018-02-25 NOTE — Progress Notes (Signed)
   Catheter okay for use for dialysis today.  Follow-up scheduled with me on January 10 with left arm dialysis duplex to plan second stage basilic vein fistula.  If there are issues prior please contact me.  Abbagale Goguen C. Donzetta Matters, MD [facility] [place] Office: [phone] Pager: [phone]

## 2018-02-25 NOTE — Progress Notes (Signed)
  Progress Note    02/25/2018 10:00 AM Day of Surgery  Subjective:\ no o/n complaints  Vitals:   02/24/18 2330 02/25/18 0844  BP: (!) 145/88 (!) 142/87  Pulse: 90 87  Resp: 16 15  Temp: 98.1 F (36.7 C) 98.2 F (36.8 C)  SpO2: 95% 99%    Physical Exam: aaox3 Non labored respirations Left arm with strong thrill in avf   CBC    Component Value Date/Time   WBC 6.8 02/25/2018 0230   RBC 2.71 (L) 02/25/2018 0230   HGB 7.8 (L) 02/25/2018 0230   HCT 24.1 (L) 02/25/2018 0230   PLT 289 02/25/2018 0230   MCV 88.9 02/25/2018 0230   MCH 28.8 02/25/2018 0230   MCHC 32.4 02/25/2018 0230   RDW 15.6 (H) 02/25/2018 0230   LYMPHSABS 2.0 10/10/2014 1541   MONOABS 0.3 10/10/2014 1541   EOSABS 0.4 10/10/2014 1541   BASOSABS 0.0 10/10/2014 1541    BMET    Component Value Date/Time   NA 138 02/25/2018 0230   K 4.8 02/25/2018 0230   CL 106 02/25/2018 0230   CO2 18 (L) 02/25/2018 0230   GLUCOSE 106 (H) 02/25/2018 0230   BUN 122 (H) 02/25/2018 0230   CREATININE 10.61 (H) 02/25/2018 0230   CALCIUM 7.6 (L) 02/25/2018 0230   GFRNONAA 5 (L) 02/25/2018 0230   GFRAA 5 (L) 02/25/2018 0230    INR No results found for: INR   Intake/Output Summary (Last 24 hours) at 02/25/2018 1000 Last data filed at 02/25/2018 0300 Gross per 24 hour  Intake -  Output 1700 ml  Net -1700 ml     Assessment:  62 y.o. male is here with uremia, does not have mature access for hd Plan: OR today for tdc placement   Kimmy Totten C. Donzetta Matters, MD [facility] [place] Office: [phone] Pager: [phone]  02/25/2018 10:00 AM

## 2018-02-25 NOTE — Anesthesia Preprocedure Evaluation (Addendum)
Anesthesia Evaluation  Patient identified by MRN, date of birth, ID band  Reviewed: Allergy & Precautions, H&P , NPO status , Patient's Chart, lab work & pertinent test results  History of Anesthesia Complications Negative for: history of anesthetic complications  Airway Mallampati: II  TM Distance: >3 FB Neck ROM: Full    Dental  (+) Poor Dentition, Dental Advisory Given   Pulmonary neg pulmonary ROS, former smoker,    Pulmonary exam normal breath sounds clear to auscultation       Cardiovascular Exercise Tolerance: Good hypertension, Pt. on medications Normal cardiovascular exam     Neuro/Psych PSYCHIATRIC DISORDERS Schizophrenia negative neurological ROS     GI/Hepatic negative GI ROS, Neg liver ROS,   Endo/Other  diabetes  Renal/GU Renal InsufficiencyRenal disease  negative genitourinary   Musculoskeletal   Abdominal   Peds  Hematology negative hematology ROS (+) anemia ,   Anesthesia Other Findings   Reproductive/Obstetrics negative OB ROS                            Anesthesia Physical  Anesthesia Plan  ASA: III  Anesthesia Plan: General   Post-op Pain Management:    Induction: Intravenous  PONV Risk Score and Plan: 2 and Ondansetron and Dexamethasone  Airway Management Planned: LMA  Additional Equipment:   Intra-op Plan:   Post-operative Plan: Extubation in OR  Informed Consent: I have reviewed the patients History and Physical, chart, labs and discussed the procedure including the risks, benefits and alternatives for the proposed anesthesia with the patient or authorized representative who has indicated his/her understanding and acceptance.   Dental advisory given  Plan Discussed with: CRNA and Anesthesiologist  Anesthesia Plan Comments:        Anesthesia Quick Evaluation

## 2018-02-25 NOTE — Progress Notes (Signed)
[facility] Progress Note  Patient: Jonathan Mcneil JAS:[id number]   PCP: Merrilee Seashore, MD DOB: 06/23/1955   DOA: 02/24/2018   DOS: 02/25/2018   Date of Service: the patient was seen and examined on 02/25/2018  Brief hospital course: Pt. 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 Currently further plan is continue further treatment with HD.  Subjective: Feels better.  Some nausea.  No vomiting.  Assessment and Plan: Uremia, ESRD, metabolic acidosis -Patient with known progressive CKD -Recent placement of fistula in anticipation of impending need for HD -He has had progressive symptoms c/w uremia -Labs show marked ongoing worsening of renal function with acidosis -His fistula was recently placed in his left arm but I did not appreciate a thrill on palpation today Nephrology consult, vascular surgery consulted, appreciate their input and assistance.  Underwent TDC placement and tolerated very well.  HTN -Continue Norvasc -His BP is likely to improve if HD is initiated  Type 2 diabetes mellitus, controlled, renal complication -Recent A1P was 6.6 -He is not on medications at this time -He may benefit from oral therapy for long-time DM control -However, at this time his glucose is controlled enough for it to be reasonable not to provide SSI  Schizophrenia -He is not taking medication for this -By his description, he only needs medication if he is actively suicidal or homicidal, and he denies this at this time  Anemia Chronic kidney disease -This is significantly worse than prior, but appears more likely related to progression of renal disease -Will monitor to ensure stability -If worsening, consider GI evaluation  Marijuana abuse -Cessation encouraged; this should be encouraged on an ongoing basis -UDS ordered  Diet: renal diet DVT  Prophylaxis: subcutaneous Heparin  Advance goals of care discussion: DNRDNI  Family Communication: no family was present at bedside, at the time of interview.   Disposition:  Discharge to home.  Consultants: nephrology, vascular surgery  Procedures: HD, TDC placement  Scheduled Meds: . amLODipine  10 mg Oral Daily  . Chlorhexidine Gluconate Cloth  6 each Topical Q0600  . darbepoetin (ARANESP) injection - DIALYSIS  60 mcg Intravenous Q Wed-HD  . enoxaparin (LOVENOX) injection  30 mg Subcutaneous Q24H  . mupirocin ointment  1 application Nasal BID  . pravastatin  20 mg Oral q1800   Continuous Infusions: . sodium chloride    . sodium chloride    . sodium chloride 10 mL/hr at 02/25/18 1017  . ceFAZolin    . ferric gluconate (FERRLECIT/NULECIT) IV     PRN Meds: sodium chloride, sodium chloride, acetaminophen **OR** acetaminophen, alteplase, calcium carbonate (dosed in mg elemental calcium), camphor-menthol **AND** hydrOXYzine, docusate sodium, feeding supplement (NEPRO CARB STEADY), gabapentin, heparin, lidocaine (PF), lidocaine-prilocaine, ondansetron **OR** ondansetron (ZOFRAN) IV, pentafluoroprop-tetrafluoroeth, sorbitol, zolpidem Antibiotics: Anti-infectives (From admission, onward)   Start     Dose/Rate Route Frequency Ordered Stop   02/25/18 1030  ceFAZolin (ANCEF) IVPB 2g/100 mL premix  Status:  Discontinued     2 g 200 mL/hr over 30 Minutes Intravenous  Once 02/25/18 1017 02/25/18 1208   02/25/18 1002  ceFAZolin (ANCEF) 2-4 GM/100ML-% IVPB    Note to Pharmacy:  Grace Blight   : cabinet override      02/25/18 1002 02/25/18 2214       Objective: Physical Exam: Vitals:   02/25/18 1135 02/25/18 1150 02/25/18 1216 02/25/18 1622  BP: (!) 145/78 Marland Kitchen)  141/87 (!) 150/88 140/90  Pulse: 85 88 84 87  Resp: (!) 22 12 15 16   Temp:  97.9 F (36.6 C) 97.8 F (36.6 C) 98.2 F (36.8 C)  TempSrc:   Oral   SpO2: 97% 100% 96% 100%  Weight:      Height:        Intake/Output  Summary (Last 24 hours) at 02/25/2018 1930 Last data filed at 02/25/2018 1857 Gross per 24 hour  Intake 1270 ml  Output 2880 ml  Net -1610 ml   Filed Weights   02/25/18 1015  Weight: 97.5 kg   General: Alert, Awake and Oriented to Time, Place and Person. Appear in mild distress, affect appropriate Eyes: PERRL, Conjunctiva normal ENT: Oral Mucosa clear moist. Neck: no JVD, no Abnormal Mass Or lumps Cardiovascular: S1 and S2 Present, no Murmur, Peripheral Pulses Present Respiratory: normal respiratory effort, Bilateral Air entry equal and Decreased, no use of accessory muscle, Clear to Auscultation, no Crackles, n wheezes Abdomen: Bowel Sound present, Soft and no tenderness, no hernia Skin: no redness, no Rash, no induration Extremities: no Pedal edema, no calf tenderness Neurologic: Grossly no focal neuro deficit. Bilaterally Equal motor strength  Data Reviewed: CBC: Recent Labs  Lab 02/24/18 0748 02/25/18 0230  WBC 8.8 6.8  HGB 7.8* 7.8*  HCT 24.9* 24.1*  MCV 91.5 88.9  PLT 291 865   Basic Metabolic Panel: Recent Labs  Lab 02/24/18 0748 02/25/18 0230  NA 134* 138  K 4.8 4.8  CL 103 106  CO2 18* 18*  GLUCOSE 132* 106*  BUN 122* 122*  CREATININE 10.60* 10.61*  CALCIUM 7.3* 7.6*  PHOS  --  6.0*    Liver Function Tests: Recent Labs  Lab 02/24/18 0748 02/25/18 0230  AST 40  --   ALT 50*  --   ALKPHOS 101  --   BILITOT 0.4  --   PROT 6.3*  --   ALBUMIN 2.6* 2.4*   No results for input(s): LIPASE, AMYLASE in the last 168 hours. No results for input(s): AMMONIA in the last 168 hours. Coagulation Profile: No results for input(s): INR, PROTIME in the last 168 hours. Cardiac Enzymes: No results for input(s): CKTOTAL, CKMB, CKMBINDEX, TROPONINI in the last 168 hours. BNP (last 3 results) No results for input(s): PROBNP in the last 8760 hours. CBG: Recent Labs  Lab 02/25/18 1119  GLUCAP 107*   Studies: X-ray Chest Pa Or Ap  Result Date:  02/25/2018 CLINICAL DATA:  62 year old male with a history of dialysis catheter insertion EXAM: CHEST  1 VIEW COMPARISON:  02/24/2018 FINDINGS: Cardiomediastinal silhouette unchanged in size and contour. Low lung volumes accentuates the interstitium. Improved appearance of the interlobular septal thickening compared to the prior. No pneumothorax or pleural effusion. No confluent airspace disease. Interval placement of right IJ approach hemodialysis catheter with the tip terminating at the superior cavoatrial junction. No pneumothorax. IMPRESSION: Interval placement of right IJ hemodialysis catheter with no complicating features. Improved appearance of edema. Electronically Signed   By: Corrie Mckusick D.O.   On: 02/25/2018 13:49   Dg Fluoro Guide Cv Line-no Report  Result Date: 02/25/2018 Fluoroscopy was utilized by the requesting physician.  No radiographic interpretation.     Time spent: 35 minutes  Author: Berle Mull, MD [facility] Pager: [phone] 02/25/2018 7:30 PM  Between 7PM-7AM, please contact night-coverage at [email or web address], password [facility]

## 2018-02-26 ENCOUNTER — Encounter (HOSPITAL_COMMUNITY): Payer: Self-pay | Admitting: Vascular Surgery

## 2018-02-26 LAB — RENAL FUNCTION PANEL
Albumin: 2.2 g/dL — ABNORMAL LOW (ref 3.5–5.0)
Anion gap: 13 (ref 5–15)
BUN: 92 mg/dL — ABNORMAL HIGH (ref 8–23)
CO2: 22 mmol/L (ref 22–32)
Calcium: 7.4 mg/dL — ABNORMAL LOW (ref 8.9–10.3)
Chloride: 103 mmol/L (ref 98–111)
Creatinine, Ser: 9.18 mg/dL — ABNORMAL HIGH (ref 0.61–1.24)
GFR calc Af Amer: 6 mL/min — ABNORMAL LOW (ref >60)
GFR calc non Af Amer: 6 mL/min — ABNORMAL LOW (ref >60)
Glucose, Bld: 170 mg/dL — ABNORMAL HIGH (ref 70–99)
Phosphorus: 5.6 mg/dL — ABNORMAL HIGH (ref 2.5–4.6)
Potassium: 4.1 mmol/L (ref 3.5–5.1)
Sodium: 138 mmol/L (ref 135–145)

## 2018-02-26 LAB — CBC
HCT: 25.3 % — ABNORMAL LOW (ref 39.0–52.0)
Hemoglobin: 8 g/dL — ABNORMAL LOW (ref 13.0–17.0)
MCH: 28.4 pg (ref 26.0–34.0)
MCHC: 31.6 g/dL (ref 30.0–36.0)
MCV: 89.7 fL (ref 80.0–100.0)
Platelets: 279 K/uL (ref 150–400)
RBC: 2.82 MIL/uL — ABNORMAL LOW (ref 4.22–5.81)
RDW: 15.3 % (ref 11.5–15.5)
WBC: 9 K/uL (ref 4.0–10.5)
nRBC: 0 % (ref 0.0–0.2)

## 2018-02-26 LAB — ALT: ALT: 30 U/L (ref 0–44)

## 2018-02-26 LAB — PARATHYROID HORMONE, INTACT (NO CA): PTH: 379 pg/mL — ABNORMAL HIGH (ref 15–65)

## 2018-02-26 MED ORDER — HEPARIN SODIUM (PORCINE) 1000 UNIT/ML IJ SOLN
INTRAMUSCULAR | Status: AC
  Administered 2018-02-26: 3000 [IU] via INTRAVENOUS_CENTRAL
  Filled 2018-02-26: qty 4

## 2018-02-26 MED ORDER — DARBEPOETIN ALFA 60 MCG/0.3ML IJ SOSY
PREFILLED_SYRINGE | INTRAMUSCULAR | Status: AC
  Administered 2018-02-26: 60 ug via INTRAVENOUS
  Filled 2018-02-26: qty 0.3

## 2018-02-26 MED ORDER — CALCITRIOL 0.25 MCG PO CAPS
0.2500 ug | ORAL_CAPSULE | ORAL | Status: DC
  Administered 2018-02-27: 0.25 ug via ORAL
  Filled 2018-02-26: qty 1

## 2018-02-26 MED ORDER — HEPARIN SODIUM (PORCINE) 1000 UNIT/ML IJ SOLN
INTRAMUSCULAR | Status: AC
  Administered 2018-02-26: 1000 [IU] via INTRAVENOUS_CENTRAL
  Filled 2018-02-26: qty 3

## 2018-02-26 NOTE — Progress Notes (Signed)
HD tx completed @ 0135 w/o problem UF goal met Blood rinsed back VSS Report called to Aaron Edelman, [place]

## 2018-02-26 NOTE — Progress Notes (Signed)
Order obtained for one time dose of IV Valium However per pharmacy  IV valium not  available .Text paged Chaney Malling NP.New order received for valium 5 mg by mouth one time dose.

## 2018-02-26 NOTE — Progress Notes (Signed)
Patient complaining of legs cramps rates 8/10.Patient asking for IV valium received one time dose yesterday with relief.Text paged Chaney Malling NP.

## 2018-02-26 NOTE — Progress Notes (Signed)
  [facility] Progress Note   Assessment/ Plan:   1. ESRD, needing dialysis- fistula too young to use, placed 11/20.  Have contacted [facility] for [facility] placement, will occur today.  For HD #1 today after placement.    2.  HTN: on amlodipine 10 only- expect BP to improve even fruther with UF  3.  Anemia: % sat 12, will start iron with HD (full load) and give Aranesp 60 mcg q wednesday  4.  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  Dispo: will need CLIP- in process  Subjective:    HD #1 yesterday, pt already reports better energy and breathing after first rx.  For HD #2 today.   Objective:   BP (!) 159/88 (BP Location: Right Wrist)   Pulse 80   Temp 98.1 F (36.7 C) (Oral)   Resp 14   Ht 5' 9.02" (1.753 m)   Wt 96.1 kg   SpO2 100%   BMI 31.27 kg/m   Physical Exam: GEN NAD, lying flat in bed HEENT EOMI PERRL NECK JVD improved PULM normal WOB, bibasilar crackles present but improved CV RRR ABD distended EXT 2+ LE edema, improved NEURO AAO x 3 ACCESS: LUE AVF + T/B, R IJ TDC  Labs: BMET Recent Labs  Lab 02/24/18 0748 02/25/18 0230  NA 134* 138  K 4.8 4.8  CL 103 106  CO2 18* 18*  GLUCOSE 132* 106*  BUN 122* 122*  CREATININE 10.60* 10.61*  CALCIUM 7.3* 7.6*  PHOS  --  6.0*   CBC Recent Labs  Lab 02/24/18 0748 02/25/18 0230  WBC 8.8 6.8  HGB 7.8* 7.8*  HCT 24.9* 24.1*  MCV 91.5 88.9  PLT 291 289    @IMGRELPRIORS @ Medications:    . amLODipine  10 mg Oral Daily  . Chlorhexidine Gluconate Cloth  6 each Topical Q0600  . darbepoetin (ARANESP) injection - DIALYSIS  60 mcg Intravenous Q Wed-HD  . enoxaparin (LOVENOX) injection  30 mg Subcutaneous Q24H  . mupirocin ointment  1 application Nasal BID  . pravastatin  20 mg Oral q1800     Madelon Lips, MD 02/26/2018, 9:29 AM

## 2018-02-26 NOTE — Progress Notes (Signed)
[facility] Progress Note  Patient: Jonathan Mcneil MBW:[id number]   PCP: Merrilee Seashore, MD DOB: [birth year]   DOA: 02/24/2018   DOS: 02/26/2018   Date of Service: the patient was seen and examined on 02/26/2018  Brief hospital course: Pt. 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 Currently further plan is continue further treatment with HD.  Subjective: No acute complaint no nausea no vomiting no fever no chills.  Eating okay.  Assessment and Plan: Uremia, ESRD, metabolic acidosis -Patient with known progressive CKD -Recent placement of fistula in anticipation of impending need for HD -He has had progressive symptoms c/w uremia -Labs show marked ongoing worsening of renal function with acidosis -His fistula was recently placed in his left arm but I did not appreciate a thrill on palpation today Nephrology consult, vascular surgery consulted, appreciate their input and assistance.  Underwent TDC placement and tolerated very well. Now undergoing dialysis and will eventually undergo clip for outpatient HD.  HTN -Continue Norvasc -His BP is likely to improve if HD is initiated  Type 2 diabetes mellitus, controlled, renal complication -Recent S1X was 6.6 -He is not on medications at this time -He may benefit from oral therapy for long-time DM control -However, at this time his glucose is controlled enough for it to be reasonable not to provide SSI  Schizophrenia -He is not taking medication for this -By his description, he only needs medication if he is actively suicidal or homicidal, and he denies this at this time  Anemia Chronic kidney disease -This is significantly worse than prior, but appears more likely related to progression of renal disease Remaining stable now. -If worsening, consider GI evaluation  Marijuana abuse -Cessation  encouraged; this should be encouraged on an ongoing basis -UDS ordered  Diet: renal diet DVT Prophylaxis: subcutaneous Heparin  Advance goals of care discussion: DNRDNI  Family Communication: no family was present at bedside, at the time of interview.   Disposition:  Discharge to home.  PT consulted.  Consultants: nephrology, vascular surgery  Procedures: HD, TDC placement  Scheduled Meds: . amLODipine  10 mg Oral Daily  . [START ON 02/27/2018] calcitRIOL  0.25 mcg Oral Q M,W,F-HD  . Chlorhexidine Gluconate Cloth  6 each Topical Q0600  . darbepoetin (ARANESP) injection - DIALYSIS  60 mcg Intravenous Q Wed-HD  . enoxaparin (LOVENOX) injection  30 mg Subcutaneous Q24H  . mupirocin ointment  1 application Nasal BID  . pravastatin  20 mg Oral q1800   Continuous Infusions: . sodium chloride    . sodium chloride    . sodium chloride 10 mL/hr at 02/25/18 1017  . ferric gluconate (FERRLECIT/NULECIT) IV Stopped (02/26/18 1807)   PRN Meds: sodium chloride, sodium chloride, acetaminophen **OR** acetaminophen, alteplase, calcium carbonate (dosed in mg elemental calcium), camphor-menthol **AND** hydrOXYzine, docusate sodium, feeding supplement (NEPRO CARB STEADY), gabapentin, heparin, lidocaine (PF), lidocaine-prilocaine, ondansetron **OR** ondansetron (ZOFRAN) IV, pentafluoroprop-tetrafluoroeth, sorbitol, zolpidem Antibiotics: Anti-infectives (From admission, onward)   Start     Dose/Rate Route Frequency Ordered Stop   02/25/18 1030  ceFAZolin (ANCEF) IVPB 2g/100 mL premix  Status:  Discontinued     2 g 200 mL/hr over 30 Minutes Intravenous  Once 02/25/18 1017 02/25/18 1208   02/25/18 1002  ceFAZolin (ANCEF) 2-4 GM/100ML-% IVPB    Note to Pharmacy:  Grace Blight   : cabinet override      02/25/18 1002 02/25/18  2214       Objective: Physical Exam: Vitals:   02/26/18 1630 02/26/18 1700 02/26/18 1730 02/26/18 1743  BP: (!) 143/88 139/87 (!) 135/91 (!) 134/92  Pulse: 77 81 83 81    Resp:    16  Temp:    98.5 F (36.9 C)  TempSrc:    Oral  SpO2:    99%  Weight:    95.9 kg  Height:        Intake/Output Summary (Last 24 hours) at 02/26/2018 1815 Last data filed at 02/26/2018 1743 Gross per 24 hour  Intake 987.5 ml  Output 3550 ml  Net -2562.5 ml   Filed Weights   02/26/18 0142 02/26/18 1400 02/26/18 1743  Weight: 96.1 kg 96.4 kg 95.9 kg   General: Alert, Awake and Oriented to Time, Place and Person. Appear in mild distress, affect appropriate Eyes: PERRL, Conjunctiva normal ENT: Oral Mucosa clear moist. Neck: no JVD, no Abnormal Mass Or lumps Cardiovascular: S1 and S2 Present, no Murmur, Peripheral Pulses Present Respiratory: normal respiratory effort, Bilateral Air entry equal and Decreased, no use of accessory muscle, Clear to Auscultation, no Crackles, n wheezes Abdomen: Bowel Sound present, Soft and no tenderness, no hernia Skin: no redness, no Rash, no induration Extremities: no Pedal edema, no calf tenderness Neurologic: Grossly no focal neuro deficit. Bilaterally Equal motor strength  Data Reviewed: CBC: Recent Labs  Lab 02/24/18 0748 02/25/18 0230 02/26/18 1437  WBC 8.8 6.8 9.0  HGB 7.8* 7.8* 8.0*  HCT 24.9* 24.1* 25.3*  MCV 91.5 88.9 89.7  PLT 291 289 116   Basic Metabolic Panel: Recent Labs  Lab 02/24/18 0748 02/25/18 0230 02/26/18 1437  NA 134* 138 138  K 4.8 4.8 4.1  CL 103 106 103  CO2 18* 18* 22  GLUCOSE 132* 106* 170*  BUN 122* 122* 92*  CREATININE 10.60* 10.61* 9.18*  CALCIUM 7.3* 7.6* 7.4*  PHOS  --  6.0* 5.6*    Liver Function Tests: Recent Labs  Lab 02/24/18 0748 02/25/18 0230 02/25/18 2326 02/26/18 1437  AST 40  --   --   --   ALT 50*  --  30  --   ALKPHOS 101  --   --   --   BILITOT 0.4  --   --   --   PROT 6.3*  --   --   --   ALBUMIN 2.6* 2.4*  --  2.2*   No results for input(s): LIPASE, AMYLASE in the last 168 hours. No results for input(s): AMMONIA in the last 168 hours. Coagulation Profile: No  results for input(s): INR, PROTIME in the last 168 hours. Cardiac Enzymes: No results for input(s): CKTOTAL, CKMB, CKMBINDEX, TROPONINI in the last 168 hours. BNP (last 3 results) No results for input(s): PROBNP in the last 8760 hours. CBG: Recent Labs  Lab 02/25/18 1119  GLUCAP 107*   Studies: No results found.   Time spent: 35 minutes  Author: Berle Mull, MD [facility] Pager: [phone] 02/26/2018 6:15 PM  Between 7PM-7AM, please contact night-coverage at [email or web address], password [facility]

## 2018-02-26 NOTE — Progress Notes (Signed)
HD tx initiated via HD cath w/o problem Bilat ports: pull/push/flush equally w/o problem VSS Will continue to monitor while on HD tx 

## 2018-02-26 NOTE — Consult Note (Signed)
   [facility] CM Inpatient Consult   02/26/2018  Jonathan Mcneil 24-Jan-1956 [id number]  Patient screened for extreme high risk score for unplanned readmissions less than 30 days.  Patient has [employer] in [employer]. Admitted for Kidney Failure and his HD is planned for today.  Will follow up a more appropriate time for needs and progress.  Came by to follow up with inpatient RNCM regarding patient and care needs,   Please place a [facility] Care Management consult or for questions contact:   Natividad Brood, RN BSN [facility] Liaison  [phone] business mobile phone Toll free office [phone]

## 2018-02-27 LAB — CBC
HCT: 28 % — ABNORMAL LOW (ref 39.0–52.0)
Hemoglobin: 8.4 g/dL — ABNORMAL LOW (ref 13.0–17.0)
MCH: 27.1 pg (ref 26.0–34.0)
MCHC: 30 g/dL (ref 30.0–36.0)
MCV: 90.3 fL (ref 80.0–100.0)
Platelets: 286 10*3/uL (ref 150–400)
RBC: 3.1 MIL/uL — ABNORMAL LOW (ref 4.22–5.81)
RDW: 15.3 % (ref 11.5–15.5)
WBC: 5.6 10*3/uL (ref 4.0–10.5)
nRBC: 0 % (ref 0.0–0.2)

## 2018-02-27 LAB — RENAL FUNCTION PANEL
Albumin: 2.2 g/dL — ABNORMAL LOW (ref 3.5–5.0)
Anion gap: 11 (ref 5–15)
BUN: 46 mg/dL — ABNORMAL HIGH (ref 8–23)
CO2: 26 mmol/L (ref 22–32)
Calcium: 7.6 mg/dL — ABNORMAL LOW (ref 8.9–10.3)
Chloride: 101 mmol/L (ref 98–111)
Creatinine, Ser: 6.2 mg/dL — ABNORMAL HIGH (ref 0.61–1.24)
GFR calc Af Amer: 10 mL/min — ABNORMAL LOW (ref >60)
GFR calc non Af Amer: 9 mL/min — ABNORMAL LOW (ref >60)
Glucose, Bld: 122 mg/dL — ABNORMAL HIGH (ref 70–99)
Phosphorus: 4.1 mg/dL (ref 2.5–4.6)
Potassium: 4.3 mmol/L (ref 3.5–5.1)
Sodium: 138 mmol/L (ref 135–145)

## 2018-02-27 LAB — HEPATITIS B CORE ANTIBODY, TOTAL: Hep B Core Total Ab: NEGATIVE

## 2018-02-27 LAB — HEPATITIS B SURFACE ANTIBODY,QUALITATIVE: Hep B S Ab: NONREACTIVE

## 2018-02-27 LAB — HEPATITIS B SURFACE ANTIGEN: Hepatitis B Surface Ag: NEGATIVE

## 2018-02-27 MED ORDER — HEPARIN SODIUM (PORCINE) 1000 UNIT/ML IJ SOLN
INTRAMUSCULAR | Status: AC
  Administered 2018-02-27: 13:00:00
  Filled 2018-02-27: qty 1

## 2018-02-27 MED ORDER — CALCITRIOL 0.25 MCG PO CAPS
ORAL_CAPSULE | ORAL | Status: AC
  Administered 2018-02-27: 0.25 ug
  Filled 2018-02-27: qty 1

## 2018-02-27 NOTE — Progress Notes (Signed)
  [facility] Progress Note   Assessment/ Plan:   1. ESRD, needing dialysis- fistula too young to use, placed 11/20.  Appreciate TDC with [facility].  HD #3 today.    2.  HTN: on amlodipine 10 only- expect BP to improve even fruther with UF  3.  Anemia: % sat 12, will start iron with HD (full load) and give Aranesp 60 mcg q wednesday  4.  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  Dispo: will need CLIP- in process  Subjective:    HD #3 today- doing well.  Reported some cramps but feeling OK.     Objective:   BP (!) 155/87 (BP Location: Right Arm)   Pulse 80   Temp 97.8 F (36.6 C) (Oral)   Resp 10   Ht 5' 9.02" (1.753 m)   Wt 93.4 kg Comment: stood to scale   SpO2 100%   BMI 30.39 kg/m   Physical Exam: GEN NAD, lying flat in bed HEENT EOMI PERRL NECK JVD improved PULM normal WOB, clear CV RRR ABD improved distention EXT trace LE edema NEURO AAO x 3 ACCESS: LUE AVF + T/B, R IJ [facility]  Labs: BMET Recent Labs  Lab 02/24/18 0748 02/25/18 0230 02/26/18 1437 02/27/18 0814  NA 134* 138 138 138  K 4.8 4.8 4.1 4.3  CL 103 106 103 101  CO2 18* 18* 22 26  GLUCOSE 132* 106* 170* 122*  BUN 122* 122* 92* 46*  CREATININE 10.60* 10.61* 9.18* 6.20*  CALCIUM 7.3* 7.6* 7.4* 7.6*  PHOS  --  6.0* 5.6* 4.1   CBC Recent Labs  Lab 02/24/18 0748 02/25/18 0230 02/26/18 1437 02/27/18 0814  WBC 8.8 6.8 9.0 5.6  HGB 7.8* 7.8* 8.0* 8.4*  HCT 24.9* 24.1* 25.3* 28.0*  MCV 91.5 88.9 89.7 90.3  PLT 291 289 279 286    @IMGRELPRIORS @ Medications:    . amLODipine  10 mg Oral Daily  . calcitRIOL  0.25 mcg Oral Q M,W,F-HD  . Chlorhexidine Gluconate Cloth  6 each Topical Q0600  . darbepoetin (ARANESP) injection - DIALYSIS  60 mcg Intravenous Q Wed-HD  . enoxaparin (LOVENOX) injection  30 mg Subcutaneous Q24H  . pravastatin  20 mg Oral q1800     Madelon Lips, MD 02/27/2018, 1:56 PM

## 2018-02-27 NOTE — Plan of Care (Signed)
  Problem: Health Behavior/Discharge Planning: Goal: Ability to manage health-related needs will improve Outcome: Progressing   Problem: Clinical Measurements: Goal: Ability to maintain clinical measurements within normal limits will improve Outcome: Progressing   Problem: Clinical Measurements: Goal: Respiratory complications will improve Outcome: Progressing   

## 2018-02-27 NOTE — Progress Notes (Signed)
PT Cancellation Note  Patient Details Name: Jonathan Mcneil MRN: [id number] DOB: January 12, 1956   Cancelled Treatment:    Reason Eval/Treat Not Completed: Patient at procedure or test/unavailable Pt currently in HD. Will follow up as schedule allows.   Leighton Ruff, PT, DPT  Acute Rehabilitation Services  Pager: [phone] Office: [phone]  Rudean Hitt 02/27/2018, 10:24 AM

## 2018-02-27 NOTE — Consult Note (Signed)
   [facility] CM Inpatient Consult   02/27/2018  Jonathan Mcneil 1955-08-29 [id number]  Follow up:  Met with the patient at the bedside.  Patient denies issues with medications, he states he drives, and he still works.  He would like to see if there are programs to help him with his co-pay for some dental work.  Explained that [facility] can follow up for any resources but work on complex disease management. Will continue to follow for disposition.  He denies any current needs.  He will have to be CLIPPED for his dialysis.  He endorse Dr. Merrilee Seashore. Spoke with inpatient RNCM of patient in network noted with [employer]. For questions, please contact:  Natividad Brood, RN BSN [facility] Liaison  [phone] business mobile phone Toll free office [phone]

## 2018-02-27 NOTE — Progress Notes (Signed)
[facility] Progress Note  Patient: Jonathan Mcneil RSW:[id number]   PCP: Merrilee Seashore, MD DOB: 1955-07-04   DOA: 02/24/2018   DOS: 02/27/2018   Date of Service: the patient was seen and examined on 02/27/2018  Brief hospital course: Pt. 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 Currently further plan is continue further treatment with HD.  Subjective: No acute event, tolerating HD.  Assessment and Plan: Uremia, ESRD, metabolic acidosis -Patient with known progressive CKD -Recent placement of fistula in anticipation of impending need for HD -He has had progressive symptoms c/w uremia -Labs show marked ongoing worsening of renal function with acidosis -His fistula was recently placed in his left arm but I did not appreciate a thrill on palpation today Nephrology consult, vascular surgery consulted, appreciate their input and assistance.  Underwent TDC placement and tolerated very well. Now undergoing dialysis and will eventually undergo clip for outpatient HD.  HTN -Continue Norvasc -His BP is likely to improve if HD is initiated  Type 2 diabetes mellitus, controlled, renal complication -Recent K9F was 6.6 -He is not on medications at this time -He may benefit from oral therapy for long-time DM control -However, at this time his glucose is controlled enough for it to be reasonable not to provide SSI  Schizophrenia -He is not taking medication for this -By his description, he only needs medication if he is actively suicidal or homicidal, and he denies this at this time  Anemia Chronic kidney disease -This is significantly worse than prior, but appears more likely related to progression of renal disease Remaining stable now. -If worsening, consider GI evaluation  Marijuana abuse -Cessation encouraged; this should be encouraged on an  ongoing basis -UDS ordered  Diet: renal diet DVT Prophylaxis: subcutaneous Heparin  Advance goals of care discussion: DNRDNI  Family Communication: no family was present at bedside, at the time of interview.   Disposition:  Discharge to home.  PT consulted.  Consultants: nephrology, vascular surgery  Procedures: HD, TDC placement  Scheduled Meds: . amLODipine  10 mg Oral Daily  . calcitRIOL  0.25 mcg Oral Q M,W,F-HD  . Chlorhexidine Gluconate Cloth  6 each Topical Q0600  . darbepoetin (ARANESP) injection - DIALYSIS  60 mcg Intravenous Q Wed-HD  . enoxaparin (LOVENOX) injection  30 mg Subcutaneous Q24H  . pravastatin  20 mg Oral q1800   Continuous Infusions: . sodium chloride    . sodium chloride    . sodium chloride 10 mL/hr at 02/25/18 1017  . ferric gluconate (FERRLECIT/NULECIT) IV Stopped (02/27/18 1157)   PRN Meds: sodium chloride, sodium chloride, acetaminophen **OR** acetaminophen, alteplase, calcium carbonate (dosed in mg elemental calcium), camphor-menthol **AND** hydrOXYzine, docusate sodium, feeding supplement (NEPRO CARB STEADY), gabapentin, heparin, lidocaine (PF), lidocaine-prilocaine, ondansetron **OR** ondansetron (ZOFRAN) IV, pentafluoroprop-tetrafluoroeth, sorbitol, zolpidem Antibiotics: Anti-infectives (From admission, onward)   Start     Dose/Rate Route Frequency Ordered Stop   02/25/18 1030  ceFAZolin (ANCEF) IVPB 2g/100 mL premix  Status:  Discontinued     2 g 200 mL/hr over 30 Minutes Intravenous  Once 02/25/18 1017 02/25/18 1208   02/25/18 1002  ceFAZolin (ANCEF) 2-4 GM/100ML-% IVPB    Note to Pharmacy:  Grace Blight   : cabinet override      02/25/18 1002 02/25/18 2214       Objective: Physical Exam: Vitals:   02/27/18 1100 02/27/18 1130 02/27/18 1146 02/27/18 1626  BP: 136/83 (!) 145/87 (!) 155/87 131/77  Pulse: 84 81 80 82  Resp:   16 16  Temp:   97.8 F (36.6 C) (!) 97.5 F (36.4 C)  TempSrc:   Oral Oral  SpO2:   99% 100%  Weight:    93.4 kg   Height:        Intake/Output Summary (Last 24 hours) at 02/27/2018 1931 Last data filed at 02/27/2018 1146 Gross per 24 hour  Intake -  Output 2529 ml  Net -2529 ml   Filed Weights   02/26/18 1743 02/27/18 0801 02/27/18 1146  Weight: 95.9 kg 93.3 kg 93.4 kg   General: Alert, Awake and Oriented to Time, Place and Person. Appear in mild distress, affect appropriate Eyes: PERRL, Conjunctiva normal ENT: Oral Mucosa clear moist. Neck: no JVD, no Abnormal Mass Or lumps Cardiovascular: S1 and S2 Present, no Murmur, Peripheral Pulses Present Respiratory: normal respiratory effort, Bilateral Air entry equal and Decreased, no use of accessory muscle, Clear to Auscultation, no Crackles, n wheezes Abdomen: Bowel Sound present, Soft and no tenderness, no hernia Skin: no redness, no Rash, no induration Extremities: no Pedal edema, no calf tenderness Neurologic: Grossly no focal neuro deficit. Bilaterally Equal motor strength  Data Reviewed: CBC: Recent Labs  Lab 02/24/18 0748 02/25/18 0230 02/26/18 1437 02/27/18 0814  WBC 8.8 6.8 9.0 5.6  HGB 7.8* 7.8* 8.0* 8.4*  HCT 24.9* 24.1* 25.3* 28.0*  MCV 91.5 88.9 89.7 90.3  PLT 291 289 279 384   Basic Metabolic Panel: Recent Labs  Lab 02/24/18 0748 02/25/18 0230 02/26/18 1437 02/27/18 0814  NA 134* 138 138 138  K 4.8 4.8 4.1 4.3  CL 103 106 103 101  CO2 18* 18* 22 26  GLUCOSE 132* 106* 170* 122*  BUN 122* 122* 92* 46*  CREATININE 10.60* 10.61* 9.18* 6.20*  CALCIUM 7.3* 7.6* 7.4* 7.6*  PHOS  --  6.0* 5.6* 4.1    Liver Function Tests: Recent Labs  Lab 02/24/18 0748 02/25/18 0230 02/25/18 2326 02/26/18 1437 02/27/18 0814  AST 40  --   --   --   --   ALT 50*  --  30  --   --   ALKPHOS 101  --   --   --   --   BILITOT 0.4  --   --   --   --   PROT 6.3*  --   --   --   --   ALBUMIN 2.6* 2.4*  --  2.2* 2.2*   No results for input(s): LIPASE, AMYLASE in the last 168 hours. No results for input(s): AMMONIA in the  last 168 hours. Coagulation Profile: No results for input(s): INR, PROTIME in the last 168 hours. Cardiac Enzymes: No results for input(s): CKTOTAL, CKMB, CKMBINDEX, TROPONINI in the last 168 hours. BNP (last 3 results) No results for input(s): PROBNP in the last 8760 hours. CBG: Recent Labs  Lab 02/25/18 1119  GLUCAP 107*   Studies: No results found.   Time spent: 15 minutes  Author: Berle Mull, MD [facility] Pager: [phone] 02/27/2018 7:31 PM  Between 7PM-7AM, please contact night-coverage at [email or web address], password [facility]

## 2018-02-27 NOTE — Care Management Important Message (Signed)
Important Message  Patient Details  Name: Darnelle Corp MRN: [id number] Date of Birth: Aug 19, 1955   Medicare Important Message Given:  Yes    Hien Perreira 02/27/2018, 4:09 PM

## 2018-02-27 NOTE — Care Management Note (Signed)
Case Management Note  Patient Details  Name: Jonathan Mcneil MRN: [id number] Date of Birth: March 04, 1956  Subjective/Objective:   From home, still drives and works, has ESRD, waiting to be clipped. Await pt eval.                 Action/Plan: NCM will follow for transition of care needs.   Expected Discharge Date:                  Expected Discharge Plan:  Home/Self Care  In-House Referral:     Discharge planning Services  CM Consult  Post Acute Care Choice:    Choice offered to:     DME Arranged:    DME Agency:     HH Arranged:    HH Agency:     Status of Service:  In process, will continue to follow  If discussed at Long Length of Stay Meetings, dates discussed:    Additional Comments:  Zenon Mayo, RN 02/27/2018, 5:10 PM

## 2018-02-28 NOTE — Progress Notes (Signed)
Accepted at [place] .1st treatment Tuesday,December 10,2019 at 11:30am .Schedule Tuesday,Thursday,Saturday

## 2018-02-28 NOTE — Progress Notes (Signed)
[facility] Progress Note  Patient: Jonathan Mcneil VQM:[id number]   PCP: Merrilee Seashore, MD DOB: 1955/11/26   DOA: 02/24/2018   DOS: 02/28/2018   Date of Service: the patient was seen and examined on 02/28/2018  Brief hospital course: Pt. 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 Currently further plan is continue further treatment with HD.  Subjective:  No new complains no fever or chills.   Assessment and Plan: Uremia, ESRD, metabolic acidosis -Patient with known progressive CKD -Recent placement of fistula in anticipation of impending need for HD -He has had progressive symptoms c/w uremia -Labs show marked ongoing worsening of renal function with acidosis -Nephrology consult, vascular surgery consulted, appreciate their input and assistance.   -Underwent TDC placement and tolerated very well. -Now undergoing dialysis and will eventually undergo clip for outpatient HD.  HTN -Continue Norvasc -His BP is likely to improve if HD is initiated  Type 2 diabetes mellitus, controlled, renal complication -Recent D3O was 6.6 -He is not on medications at this time -He may benefit from oral therapy for long-time DM control -However, at this time his glucose is controlled enough for it to be reasonable not to provide SSI  Schizophrenia -He is not taking medication for this -By his description, he only needs medication if he is actively suicidal or homicidal, and he denies this at this time  Anemia Chronic kidney disease -This is significantly worse than prior, but appears more likely related to progression of renal disease Remaining stable now. -If worsening, consider GI evaluation  Marijuana abuse -Cessation encouraged; this should be encouraged on an ongoing basis -UDS ordered  Diet: renal diet DVT Prophylaxis: subcutaneous  Heparin  Advance goals of care discussion: DNRDNI  Family Communication: no family was present at bedside, at the time of interview.   Disposition:  Discharge to home.  PT consulted.  Consultants: nephrology, vascular surgery  Procedures: HD, TDC placement  Scheduled Meds: . amLODipine  10 mg Oral Daily  . calcitRIOL  0.25 mcg Oral Q M,W,F-HD  . Chlorhexidine Gluconate Cloth  6 each Topical Q0600  . darbepoetin (ARANESP) injection - DIALYSIS  60 mcg Intravenous Q Wed-HD  . enoxaparin (LOVENOX) injection  30 mg Subcutaneous Q24H  . pravastatin  20 mg Oral q1800   Continuous Infusions: . sodium chloride    . sodium chloride    . sodium chloride 10 mL/hr at 02/25/18 1017  . ferric gluconate (FERRLECIT/NULECIT) IV Stopped (02/27/18 1157)   PRN Meds: sodium chloride, sodium chloride, acetaminophen **OR** acetaminophen, alteplase, calcium carbonate (dosed in mg elemental calcium), camphor-menthol **AND** hydrOXYzine, docusate sodium, feeding supplement (NEPRO CARB STEADY), gabapentin, heparin, lidocaine (PF), lidocaine-prilocaine, ondansetron **OR** ondansetron (ZOFRAN) IV, pentafluoroprop-tetrafluoroeth, sorbitol, zolpidem Antibiotics: Anti-infectives (From admission, onward)   Start     Dose/Rate Route Frequency Ordered Stop   02/25/18 1030  ceFAZolin (ANCEF) IVPB 2g/100 mL premix  Status:  Discontinued     2 g 200 mL/hr over 30 Minutes Intravenous  Once 02/25/18 1017 02/25/18 1208   02/25/18 1002  ceFAZolin (ANCEF) 2-4 GM/100ML-% IVPB    Note to Pharmacy:  Grace Blight   : cabinet override      02/25/18 1002 02/25/18 2214       Objective: Physical Exam: Vitals:   02/27/18 1146 02/27/18 1626 02/27/18 2353 02/28/18 0727  BP: (!) 155/87 131/77 114/64 134/82  Pulse: 80 82 83 81  Resp: 16 16 18 11   Temp: 97.8 F (36.6 C) (!) 97.5 F (36.4 C) 98.9 F (37.2 C) 98.3 F (36.8 C)  TempSrc: Oral Oral Oral Oral  SpO2: 99% 100% 100% 100%  Weight: 93.4 kg     Height:         Intake/Output Summary (Last 24 hours) at 02/28/2018 1521 Last data filed at 02/28/2018 0600 Gross per 24 hour  Intake -  Output 551 ml  Net -551 ml   Filed Weights   02/26/18 1743 02/27/18 0801 02/27/18 1146  Weight: 95.9 kg 93.3 kg 93.4 kg   General: Alert, Awake and Oriented to Time, Place and Person. Appear in mild distress, affect appropriate Eyes: PERRL, Conjunctiva normal ENT: Oral Mucosa clear moist. Neck: no JVD, no Abnormal Mass Or lumps Cardiovascular: S1 and S2 Present, no Murmur, Peripheral Pulses Present Respiratory: normal respiratory effort, Bilateral Air entry equal and Decreased, no use of accessory muscle, Clear to Auscultation, no Crackles, n wheezes Abdomen: Bowel Sound present, Soft and no tenderness, no hernia Skin: no redness, no Rash, no induration Extremities: no Pedal edema, no calf tenderness Neurologic: Grossly no focal neuro deficit. Bilaterally Equal motor strength  Data Reviewed: CBC: Recent Labs  Lab 02/24/18 0748 02/25/18 0230 02/26/18 1437 02/27/18 0814  WBC 8.8 6.8 9.0 5.6  HGB 7.8* 7.8* 8.0* 8.4*  HCT 24.9* 24.1* 25.3* 28.0*  MCV 91.5 88.9 89.7 90.3  PLT 291 289 279 159   Basic Metabolic Panel: Recent Labs  Lab 02/24/18 0748 02/25/18 0230 02/26/18 1437 02/27/18 0814  NA 134* 138 138 138  K 4.8 4.8 4.1 4.3  CL 103 106 103 101  CO2 18* 18* 22 26  GLUCOSE 132* 106* 170* 122*  BUN 122* 122* 92* 46*  CREATININE 10.60* 10.61* 9.18* 6.20*  CALCIUM 7.3* 7.6* 7.4* 7.6*  PHOS  --  6.0* 5.6* 4.1    Liver Function Tests: Recent Labs  Lab 02/24/18 0748 02/25/18 0230 02/25/18 2326 02/26/18 1437 02/27/18 0814  AST 40  --   --   --   --   ALT 50*  --  30  --   --   ALKPHOS 101  --   --   --   --   BILITOT 0.4  --   --   --   --   PROT 6.3*  --   --   --   --   ALBUMIN 2.6* 2.4*  --  2.2* 2.2*   No results for input(s): LIPASE, AMYLASE in the last 168 hours. No results for input(s): AMMONIA in the last 168  hours. Coagulation Profile: No results for input(s): INR, PROTIME in the last 168 hours. Cardiac Enzymes: No results for input(s): CKTOTAL, CKMB, CKMBINDEX, TROPONINI in the last 168 hours. BNP (last 3 results) No results for input(s): PROBNP in the last 8760 hours. CBG: Recent Labs  Lab 02/25/18 1119  GLUCAP 107*   Studies: No results found.   Time spent: 15 minutes  Author: Berle Mull, MD [facility] Pager: [phone] 02/28/2018 3:21 PM  Between 7PM-7AM, please contact night-coverage at [email or web address], password [facility]

## 2018-02-28 NOTE — Progress Notes (Signed)
PT Cancellation Note  Patient Details Name: Jonathan Mcneil MRN: [id number] DOB: Jul 08, 1955   Cancelled Treatment:    Reason Eval/Treat Not Completed: Other (comment).  Pt is feeling he is fine,  Has been up to walk and do his daily shaving and self care.  Will try again tomorrow to ensure he is still feeling independent:  if not eval, if fine will dc order.   Ramond Dial 02/28/2018, 1:58 PM   Mee Hives, PT MS Acute Rehab Dept. Number: [place] and [place]

## 2018-02-28 NOTE — Progress Notes (Signed)
  [facility] Progress Note   Assessment/ Plan:   1. ESRD, needing dialysis- fistula too young to use, placed 11/20.  Appreciate TDC with [facility].  HD #3 12/6.  Will do rx tomorrow AM 12/8 and then will be able to d/c after that.  .    2.  HTN: on amlodipine 10 only- expect BP to improve even fruther with UF  3.  Anemia: % sat 12, will start iron with HD (full load) and give Aranesp 60 mcg q wednesday  4.  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  Dispo: will need CLIP- complete 2nd shift [facility].    Subjective:    Doing well.  Feeling well.  CLIP process complete.     Objective:   BP 134/82 (BP Location: Right Arm)   Pulse 81   Temp 98.3 F (36.8 C) (Oral)   Resp 11   Ht 5' 9.02" (1.753 m)   Wt 93.4 kg Comment: stood to scale   SpO2 100%   BMI 30.39 kg/m   Physical Exam: GEN NAD, lying flat in bed HEENT EOMI PERRL NECK JVD improved PULM normal WOB, clear CV RRR ABD improved distention EXT trace LE edema NEURO AAO x 3 ACCESS: LUE AVF + T/B, R IJ [facility]  Labs: BMET Recent Labs  Lab 02/24/18 0748 02/25/18 0230 02/26/18 1437 02/27/18 0814  NA 134* 138 138 138  K 4.8 4.8 4.1 4.3  CL 103 106 103 101  CO2 18* 18* 22 26  GLUCOSE 132* 106* 170* 122*  BUN 122* 122* 92* 46*  CREATININE 10.60* 10.61* 9.18* 6.20*  CALCIUM 7.3* 7.6* 7.4* 7.6*  PHOS  --  6.0* 5.6* 4.1   CBC Recent Labs  Lab 02/24/18 0748 02/25/18 0230 02/26/18 1437 02/27/18 0814  WBC 8.8 6.8 9.0 5.6  HGB 7.8* 7.8* 8.0* 8.4*  HCT 24.9* 24.1* 25.3* 28.0*  MCV 91.5 88.9 89.7 90.3  PLT 291 289 279 286    @IMGRELPRIORS @ Medications:    . amLODipine  10 mg Oral Daily  . calcitRIOL  0.25 mcg Oral Q M,W,F-HD  . Chlorhexidine Gluconate Cloth  6 each Topical Q0600  . darbepoetin (ARANESP) injection - DIALYSIS  60 mcg Intravenous Q Wed-HD  . enoxaparin (LOVENOX) injection  30 mg Subcutaneous Q24H  . pravastatin  20 mg Oral q1800     Madelon Lips, MD 02/28/2018, 11:42 AM

## 2018-03-01 MED ORDER — ACETAMINOPHEN 325 MG PO TABS
ORAL_TABLET | ORAL | Status: AC
  Filled 2018-03-01: qty 2

## 2018-03-01 MED ORDER — HEPARIN SODIUM (PORCINE) 1000 UNIT/ML DIALYSIS
3000.0000 [IU] | INTRAMUSCULAR | Status: DC | PRN
  Administered 2018-03-01: 3000 [IU]
  Filled 2018-03-01: qty 3

## 2018-03-01 MED ORDER — CALCITRIOL 0.25 MCG PO CAPS: 0.2500 ug | ORAL_CAPSULE | ORAL | 0 refills | Status: DC

## 2018-03-01 MED ORDER — HEPARIN SODIUM (PORCINE) 1000 UNIT/ML IJ SOLN
INTRAMUSCULAR | Status: AC
  Administered 2018-03-01: 19:00:00
  Filled 2018-03-01: qty 3

## 2018-03-01 MED ORDER — NEPRO/CARBSTEADY PO LIQD: 237.0000 mL | Freq: Two times a day (BID) | ORAL | 0 refills | Status: DC

## 2018-03-01 NOTE — Progress Notes (Signed)
  [facility] Progress Note   Assessment/ Plan:   1. ESRD, needing dialysis- fistula too young to use, placed 11/20.  Appreciate TDC with [facility].  HD #3 12/6.  For HD today and then OK to d/c.  2.  HTN: on amlodipine 10 only- expect BP to improve even fruther with UF, well-controlled so far.    3.  Anemia: % sat 12,  iron with HD (full load), started 12/4, and give Aranesp 60 mcg q wednesday  4.  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  Dispo: CLIP complete 2nd shift [facility].  Ok to go after HD today  Subjective:    For HD today.  No complaints    Objective:   BP (!) 145/92 (BP Location: Right Arm)   Pulse 83   Temp 98.6 F (37 C) (Oral)   Resp 16   Ht 5' 9.02" (1.753 m)   Wt 93.4 kg Comment: stood to scale   SpO2 100%   BMI 30.39 kg/m   Physical Exam: GEN NAD, lying flat in bed HEENT EOMI PERRL NECK JVD improved PULM normal WOB, clear CV RRR ABD improved distention EXT trace LE edema NEURO AAO x 3 ACCESS: LUE AVF + T/B, R IJ [facility]  Labs: BMET Recent Labs  Lab 02/24/18 0748 02/25/18 0230 02/26/18 1437 02/27/18 0814  NA 134* 138 138 138  K 4.8 4.8 4.1 4.3  CL 103 106 103 101  CO2 18* 18* 22 26  GLUCOSE 132* 106* 170* 122*  BUN 122* 122* 92* 46*  CREATININE 10.60* 10.61* 9.18* 6.20*  CALCIUM 7.3* 7.6* 7.4* 7.6*  PHOS  --  6.0* 5.6* 4.1   CBC Recent Labs  Lab 02/24/18 0748 02/25/18 0230 02/26/18 1437 02/27/18 0814  WBC 8.8 6.8 9.0 5.6  HGB 7.8* 7.8* 8.0* 8.4*  HCT 24.9* 24.1* 25.3* 28.0*  MCV 91.5 88.9 89.7 90.3  PLT 291 289 279 286    @IMGRELPRIORS @ Medications:    . amLODipine  10 mg Oral Daily  . calcitRIOL  0.25 mcg Oral Q M,W,F-HD  . Chlorhexidine Gluconate Cloth  6 each Topical Q0600  . darbepoetin (ARANESP) injection - DIALYSIS  60 mcg Intravenous Q Wed-HD  . enoxaparin (LOVENOX) injection  30 mg Subcutaneous Q24H  . pravastatin  20 mg Oral q1800     Madelon Lips, MD 03/01/2018, 1:27 PM

## 2018-03-01 NOTE — Evaluation (Signed)
Physical Therapy Evaluation Patient Details Name: Jonathan Mcneil MRN: [id number] DOB: February 12, 1956 Today's Date: 03/01/2018   History of Present Illness  Pt is a 63 y.o. 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   Clinical Impression  Patient evaluated by Physical Therapy with no further acute PT needs identified. He states he is eager to return to work when able. Patient ambulating 350 feet with mild balance deficits noted but no overt loss of balance. History of peripheral neuropathy and education provided on daily skin inspections and proper footwear; patient verbalizing understanding. All education has been completed and the patient has no further questions. No follow-up Physical Therapy or equipment needs. PT is signing off. Thank you for this referral.     Follow Up Recommendations No PT follow up    Equipment Recommendations  None recommended by PT    Recommendations for Other Services       Precautions / Restrictions Precautions Precautions: Fall Restrictions Weight Bearing Restrictions: No      Mobility  Bed Mobility Overal bed mobility: Independent                Transfers Overall transfer level: Independent                  Ambulation/Gait Ambulation/Gait assistance: Supervision Gait Distance (Feet): 350 Feet Assistive device: None Gait Pattern/deviations: Step-through pattern;Wide base of support;Decreased dorsiflexion - right;Decreased dorsiflexion - left   Gait velocity interpretation: 1.31 - 2.62 ft/sec, indicative of limited community ambulator General Gait Details: Patient with noted decreased bilateral heel strike at initial contact and mild unsteadiness  Stairs            Wheelchair Mobility    Modified Rankin (Stroke Patients Only)       Balance Overall balance assessment: Mild deficits observed, not formally  tested                                           Pertinent Vitals/Pain Pain Assessment: No/denies pain    Home Living Family/patient expects to be discharged to:: Private residence Living Arrangements: Spouse/significant other Available Help at Discharge: Family           Home Equipment: None      Prior Function Level of Independence: Independent         Comments: works for Hospital doctor        Extremity/Trunk Assessment   Upper Extremity Assessment Upper Extremity Assessment: Overall WFL for tasks assessed    Lower Extremity Assessment Lower Extremity Assessment: RLE deficits/detail;LLE deficits/detail RLE Sensation: history of peripheral neuropathy LLE Sensation: history of peripheral neuropathy    Cervical / Trunk Assessment Cervical / Trunk Assessment: Normal  Communication   Communication: No difficulties  Cognition Arousal/Alertness: Awake/alert Behavior During Therapy: WFL for tasks assessed/performed Overall Cognitive Status: Within Functional Limits for tasks assessed                                        General Comments      Exercises     Assessment/Plan    PT Assessment Patent does not need any further PT services  PT Problem List  PT Treatment Interventions      PT Goals (Current goals can be found in the Care Plan section)  Acute Rehab PT Goals Patient Stated Goal: "get back to work." PT Goal Formulation: All assessment and education complete, DC therapy    Frequency     Barriers to discharge        Co-evaluation               AM-PAC PT "6 Clicks" Mobility  Outcome Measure Help needed turning from your back to your side while in a flat bed without using bedrails?: None Help needed moving from lying on your back to sitting on the side of a flat bed without using bedrails?: None Help needed moving to and from a bed to a chair (including a wheelchair)?:  None Help needed standing up from a chair using your arms (e.g., wheelchair or bedside chair)?: None Help needed to walk in hospital room?: None Help needed climbing 3-5 steps with a railing? : A Little 6 Click Score: 23    End of Session   Activity Tolerance: Patient tolerated treatment well Patient left: in bed;with call bell/phone within reach Nurse Communication: Mobility status PT Visit Diagnosis: Unsteadiness on feet (R26.81);Difficulty in walking, not elsewhere classified (R26.2)    Time: 8185-6314 PT Time Calculation (min) (ACUTE ONLY): 11 min   Charges:   PT Evaluation $PT Eval Moderate Complexity: 1 Mod          Ellamae Sia, [place], DPT Acute Rehabilitation Services Pager [phone] Office [phone]   Willy Eddy 03/01/2018, 12:50 PM

## 2018-03-02 DIAGNOSIS — Z23 Encounter for immunization: Secondary | ICD-10-CM | POA: Diagnosis not present

## 2018-03-02 NOTE — Progress Notes (Signed)
Pt seen by MD, orders written for d/c.  Went over discharge instructions with pt and answered all questions.  Removed IV, no complications.  Dr note given to pt for work.  Escorted for discharge via wheelchair with all belongings.  Will follow up outpatient with MD and go for dialysis tomorrow.

## 2018-03-02 NOTE — Progress Notes (Signed)
[facility] ROUNDING NOTE   Subjective:   Appears to be doing well most likely to be discharged today received dialysis late last night.  Blood pressure 128/86 pulse 79 temperature 97.8 O2 sats 100% room air  Sodium 138 potassium 4.3 chloride 101 CO2 26 BUN 46 creatinine 6.2 calcium 7.6 albumin 2.2 glucose 122 WBC 5.6 hemoglobin 8.4 platelets 286  Objective:  Vital signs in last 24 hours:  Temp:  [97.8 F (36.6 C)-98.6 F (37 C)] 97.8 F (36.6 C) (12/09 0736) Pulse Rate:  [79-86] 79 (12/09 0736) Resp:  [16-18] 18 (12/09 0736) BP: (116-149)/(48-86) 128/86 (12/09 0736) SpO2:  [100 %] 100 % (12/09 0736) Weight:  [93.9 kg-94.9 kg] 93.9 kg (12/08 1926)  Weight change:  Filed Weights   02/27/18 1146 03/01/18 1516 03/01/18 1926  Weight: 93.4 kg 94.9 kg 93.9 kg    Intake/Output: I/O last 3 completed shifts: In: -  Out: 1500 [Urine:500; Other:1000]   Intake/Output this shift:  No intake/output data recorded.  CVS- RRR RS- CTA ABD- BS present soft non-distended EXT- no edema left upper extremity AV fistula right IJ [facility]   Basic Metabolic Panel: Recent Labs  Lab 02/24/18 0748 02/25/18 0230 02/26/18 1437 02/27/18 0814  NA 134* 138 138 138  K 4.8 4.8 4.1 4.3  CL 103 106 103 101  CO2 18* 18* 22 26  GLUCOSE 132* 106* 170* 122*  BUN 122* 122* 92* 46*  CREATININE 10.60* 10.61* 9.18* 6.20*  CALCIUM 7.3* 7.6* 7.4* 7.6*  PHOS  --  6.0* 5.6* 4.1    Liver Function Tests: Recent Labs  Lab 02/24/18 0748 02/25/18 0230 02/25/18 2326 02/26/18 1437 02/27/18 0814  AST 40  --   --   --   --   ALT 50*  --  30  --   --   ALKPHOS 101  --   --   --   --   BILITOT 0.4  --   --   --   --   PROT 6.3*  --   --   --   --   ALBUMIN 2.6* 2.4*  --  2.2* 2.2*   No results for input(s): LIPASE, AMYLASE in the last 168 hours. No results for input(s): AMMONIA in the last 168 hours.  CBC: Recent Labs  Lab 02/24/18 0748 02/25/18 0230 02/26/18 1437 02/27/18 0814   WBC 8.8 6.8 9.0 5.6  HGB 7.8* 7.8* 8.0* 8.4*  HCT 24.9* 24.1* 25.3* 28.0*  MCV 91.5 88.9 89.7 90.3  PLT 291 289 279 286    Cardiac Enzymes: No results for input(s): CKTOTAL, CKMB, CKMBINDEX, TROPONINI in the last 168 hours.  BNP: Invalid input(s): POCBNP  CBG: Recent Labs  Lab 02/25/18 1119  GLUCAP 107*    Microbiology: Results for orders placed or performed during the hospital encounter of 02/24/18  Surgical PCR screen     Status: None   Collection Time: 02/24/18 11:39 PM  Result Value Ref Range Status   MRSA, PCR NEGATIVE NEGATIVE Final   Staphylococcus aureus NEGATIVE NEGATIVE Final    Comment: (NOTE) The Xpert SA Assay (FDA approved for NASAL specimens in patients 61 years of age and older), is one component of a comprehensive surveillance program. It is not intended to diagnose infection nor to guide or monitor treatment. Performed at [facility], [place] [address], [place], [place] [address]     Coagulation Studies: No results for input(s): LABPROT, INR in the last 72 hours.  Urinalysis: No results  for input(s): COLORURINE, LABSPEC, [place], GLUCOSEU, HGBUR, BILIRUBINUR, KETONESUR, PROTEINUR, UROBILINOGEN, NITRITE, LEUKOCYTESUR in the last 72 hours.  Invalid input(s): APPERANCEUR    Imaging: No results found.   Medications:   . sodium chloride    . sodium chloride    . sodium chloride 10 mL/hr at 02/25/18 1017  . ferric gluconate (FERRLECIT/NULECIT) IV Stopped (02/27/18 1157)   . amLODipine  10 mg Oral Daily  . calcitRIOL  0.25 mcg Oral Q M,W,F-HD  . Chlorhexidine Gluconate Cloth  6 each Topical Q0600  . darbepoetin (ARANESP) injection - DIALYSIS  60 mcg Intravenous Q Wed-HD  . enoxaparin (LOVENOX) injection  30 mg Subcutaneous Q24H  . pravastatin  20 mg Oral q1800   sodium chloride, sodium chloride, acetaminophen **OR** acetaminophen, alteplase, calcium carbonate (dosed in mg elemental calcium), camphor-menthol **AND** hydrOXYzine, docusate  sodium, feeding supplement (NEPRO CARB STEADY), gabapentin, heparin, lidocaine (PF), lidocaine-prilocaine, ondansetron **OR** ondansetron (ZOFRAN) IV, pentafluoroprop-tetrafluoroeth, sorbitol, zolpidem  Assessment/ Plan:   End-stage renal disease fistula too young to use placed 02/11/2018 has received hemodialysis #4 03/01/2018 okay for discharge  Hypertension appears adequately controlled with ultrafiltration currently on using 10 mg amlodipine  Anemia iron load continues on Aranesp 60 mcg q. Wednesday  Bone mineral PTH 379 using calcitriol 0.25 mcg with hemodialysis  Disposition clip process complete TTS schedule [facility] okay for discharge today   LOS: [place] @TODAY @9 :13 AM

## 2018-03-03 DIAGNOSIS — D509 Iron deficiency anemia, unspecified: Secondary | ICD-10-CM | POA: Diagnosis not present

## 2018-03-03 DIAGNOSIS — Z4901 Encounter for fitting and adjustment of extracorporeal dialysis catheter: Secondary | ICD-10-CM | POA: Diagnosis not present

## 2018-03-03 DIAGNOSIS — L299 Pruritus, unspecified: Secondary | ICD-10-CM | POA: Diagnosis not present

## 2018-03-03 DIAGNOSIS — D689 Coagulation defect, unspecified: Secondary | ICD-10-CM | POA: Diagnosis not present

## 2018-03-03 DIAGNOSIS — D631 Anemia in chronic kidney disease: Secondary | ICD-10-CM | POA: Diagnosis not present

## 2018-03-03 DIAGNOSIS — N186 End stage renal disease: Secondary | ICD-10-CM | POA: Diagnosis not present

## 2018-03-03 DIAGNOSIS — N2581 Secondary hyperparathyroidism of renal origin: Secondary | ICD-10-CM | POA: Diagnosis not present

## 2018-03-03 DIAGNOSIS — E1129 Type 2 diabetes mellitus with other diabetic kidney complication: Secondary | ICD-10-CM | POA: Diagnosis not present

## 2018-03-03 DIAGNOSIS — R52 Pain, unspecified: Secondary | ICD-10-CM | POA: Diagnosis not present

## 2018-03-03 DIAGNOSIS — Z23 Encounter for immunization: Secondary | ICD-10-CM | POA: Diagnosis not present

## 2018-03-05 DIAGNOSIS — D631 Anemia in chronic kidney disease: Secondary | ICD-10-CM | POA: Diagnosis not present

## 2018-03-05 DIAGNOSIS — R52 Pain, unspecified: Secondary | ICD-10-CM | POA: Diagnosis not present

## 2018-03-05 DIAGNOSIS — N186 End stage renal disease: Secondary | ICD-10-CM | POA: Diagnosis not present

## 2018-03-05 DIAGNOSIS — Z4901 Encounter for fitting and adjustment of extracorporeal dialysis catheter: Secondary | ICD-10-CM | POA: Diagnosis not present

## 2018-03-05 DIAGNOSIS — Z23 Encounter for immunization: Secondary | ICD-10-CM | POA: Diagnosis not present

## 2018-03-05 DIAGNOSIS — L299 Pruritus, unspecified: Secondary | ICD-10-CM | POA: Diagnosis not present

## 2018-03-05 DIAGNOSIS — N2581 Secondary hyperparathyroidism of renal origin: Secondary | ICD-10-CM | POA: Diagnosis not present

## 2018-03-05 DIAGNOSIS — D689 Coagulation defect, unspecified: Secondary | ICD-10-CM | POA: Diagnosis not present

## 2018-03-05 DIAGNOSIS — E1129 Type 2 diabetes mellitus with other diabetic kidney complication: Secondary | ICD-10-CM | POA: Diagnosis not present

## 2018-03-05 DIAGNOSIS — D509 Iron deficiency anemia, unspecified: Secondary | ICD-10-CM | POA: Diagnosis not present

## 2018-03-05 NOTE — Discharge Summary (Signed)
[facility] Discharge Summary   Patient: Jonathan Mcneil WUJ:[id number]   PCP: Merrilee Seashore, MD DOB: 1955-12-29   Date of admission: 02/24/2018   Date of discharge: 03/02/2018   Discharge Diagnoses:  Principal Problem:   Uremia Active Problems:   Diabetes mellitus without complication ([place])   Type 2 diabetes mellitus with stage 4 chronic kidney disease ([place])   Metabolic acidosis   ESRD (end stage renal disease) ([place])   Hypertension   Schizophrenia ([place])   History of anemia due to CKD   Marijuana abuse   Admitted From: home Disposition:  home  Recommendations for Outpatient Follow-up:  1. Please follow-up with PCP in 1 week  Follow-up Information    Merrilee Seashore, MD. Schedule an appointment as soon as possible for a visit in 1 week(s).   Specialty:  Internal Medicine Contact information: [address] [place] [address] [phone]          Diet recommendation: Renal diet  Activity: The patient is advised to gradually reintroduce usual activities.  Discharge Condition: good  Code Status: full code  History of present illness: As per the H and P dictated on admission, "Jonathan Mcneil is a 62 y.o. male with medical history significant of  Schizophrenia; HTN; DM; and ESRD not yet on HD.  He presented today with SOB x 3 days.  He notices SOB when he eats too much food.  +cough, productive of white phlegm.  He does not think he overate for the holiday and does not know if he ate too much salt.  +weak/tired.  +N/V intermittently.  Fistula was placed 2 weeks ago.  Dr. Johnney Ou told him if he lost appetite again they might have to put him on HD; he is hungry but he can't eat like he used to because it makes him sick and unable to breathe."  Hospital Course:  Summary of his active problems in the hospital is as following. Uremia, ESRD, metabolic acidosis -Patient with known progressive CKD -Recent placement of fistula in  anticipation of impending need for HD -He has had progressive symptoms c/w uremia -Labs show marked ongoing worsening of renal function with acidosis -Nephrology consult, vascular surgery consulted, appreciate their input and assistance.   -Underwent TDC placement and tolerated very well. -Now undergoing dialysis and clip for outpatient HD.  HTN -Continue Norvasc -His BP is likely to improve over time  Type 2 diabetes mellitus, controlled, renal complication -Recent G2X was 6.6 -He is not on medications at this time -He may benefit from oral therapy for long-time DM control -However, at this time his glucose is controlled enough for it to be reasonable not to provide any meds  Schizophrenia -He is not taking medication for this -By his description, he only needs medication if he is actively suicidal or homicidal, and he denies this at this time  Anemia Chronic kidney disease -This is significantly worse than prior, but appears more likely related to progression of renal disease Remaining stable now.  Marijuana abuse -Cessation encouraged; this should be encouraged on an ongoing basis  All other chronic medical condition were stable during the hospitalization.  Patient was seen by physical therapy, who recommended no PT follow up needed  day of the discharge the patient's vitals were stable , and no other acute medical condition were reported by patient. the patient was felt safe to be discharge at home with family.  Consultants: nephrology, vascular surgery  Procedures: HD, TDC placement  DISCHARGE MEDICATION: Allergies  as of 03/02/2018      Reactions   Ibuprofen Other (See Comments)   UNSPECIFIED REACTION  Pt states he was told not to take it.   Penicillins Itching   Has patient had a PCN reaction causing immediate rash, facial/tongue/throat swelling, SOB or lightheadedness with hypotension: No Has patient had a PCN reaction causing severe rash involving mucus  membranes or skin necrosis: No Has patient had a PCN reaction that required hospitalization No Has patient had a PCN reaction occurring within the last 10 years: Unknown If all of the above answers are "NO", then may proceed with Cephalosporin use.      Medication List    TAKE these medications   amLODipine 10 MG tablet Commonly known as:  NORVASC Take 10 mg by mouth daily.   calcitRIOL 0.25 MCG capsule Commonly known as:  ROCALTROL Take 1 capsule (0.25 mcg total) by mouth every Monday, Wednesday, and Friday with hemodialysis.   feeding supplement (NEPRO CARB STEADY) Liqd Take 237 mLs by mouth 2 (two) times daily between meals.   gabapentin 100 MG capsule Commonly known as:  NEURONTIN Take 100 mg by mouth 3 (three) times daily as needed (hand numbness).   lovastatin 20 MG tablet Commonly known as:  MEVACOR Take 20 mg by mouth daily.   ondansetron 4 MG disintegrating tablet Commonly known as:  ZOFRAN ODT Take 1 tablet (4 mg total) by mouth every 8 (eight) hours as needed for nausea or vomiting.      Allergies  Allergen Reactions  . Ibuprofen Other (See Comments)    UNSPECIFIED REACTION  Pt states he was told not to take it.  Marland Kitchen Penicillins Itching    Has patient had a PCN reaction causing immediate rash, facial/tongue/throat swelling, SOB or lightheadedness with hypotension: No Has patient had a PCN reaction causing severe rash involving mucus membranes or skin necrosis: No Has patient had a PCN reaction that required hospitalization No Has patient had a PCN reaction occurring within the last 10 years: Unknown If all of the above answers are "NO", then may proceed with Cephalosporin use.   Discharge Instructions    Diet - low sodium heart healthy   Complete by:  As directed    Discharge instructions   Complete by:  As directed    It is important that you read following instructions as well as go over your medication list with RN to help you understand your care after  this hospitalization.  Discharge Instructions: Please follow-up with PCP in one week  Please request your primary care physician to go over all Hospital Tests and Procedure/Radiological results at the follow up,  Please get all Hospital records sent to your PCP by signing hospital release before you go home.   Do not take more than prescribed Pain, Sleep and Anxiety Medications. You were cared for by a hospitalist during your hospital stay. If you have any questions about your discharge medications or the care you received while you were in the hospital after you are discharged, you can call the unit you were admitted to and ask to speak with the hospitalist on call if the hospitalist that took care of you is not available.  Once you are discharged, your primary care physician will handle any further medical issues. Please note that NO REFILLS for any discharge medications will be authorized once you are discharged, as it is imperative that you return to your primary care physician (or establish a relationship with a primary care physician  if you do not have one) for your aftercare needs so that they can reassess your need for medications and monitor your lab values. You Must read complete instructions/literature along with all the possible adverse reactions/side effects for all the Medicines you take and that have been prescribed to you. Take any new Medicines after you have completely understood and accept all the possible adverse reactions/side effects. Wear Seat belts while driving. If you have smoked or chewed Tobacco in the last 2 yrs please stop smoking and/or stop any Recreational drug use.   Increase activity slowly   Complete by:  As directed      Discharge Exam: Filed Weights   02/27/18 1146 03/01/18 1516 03/01/18 1926  Weight: 93.4 kg 94.9 kg 93.9 kg   Vitals:   03/01/18 2154 03/02/18 0736  BP: 125/74 128/86  Pulse: 82 79  Resp: 16 18  Temp: 98.6 F (37 C) 97.8 F (36.6 C)    SpO2: 100% 100%   General: Appear in no distress, no Rash; Oral Mucosa moist. Cardiovascular: S1 and S2 Present, no Murmur, no JVD Respiratory: Bilateral Air entry present and Clear to Auscultation, no Crackles, no wheezes Abdomen: Bowel Sound present, Soft and no tenderness Extremities: no Pedal edema, no calf tenderness Neurology: Grossly no focal neuro deficit.  The results of significant diagnostics from this hospitalization (including imaging, microbiology, ancillary and laboratory) are listed below for reference.    Significant Diagnostic Studies: X-ray Chest Pa Or Ap  Result Date: 02/25/2018 CLINICAL DATA:  62 year old male with a history of dialysis catheter insertion EXAM: CHEST  1 VIEW COMPARISON:  02/24/2018 FINDINGS: Cardiomediastinal silhouette unchanged in size and contour. Low lung volumes accentuates the interstitium. Improved appearance of the interlobular septal thickening compared to the prior. No pneumothorax or pleural effusion. No confluent airspace disease. Interval placement of right IJ approach hemodialysis catheter with the tip terminating at the superior cavoatrial junction. No pneumothorax. IMPRESSION: Interval placement of right IJ hemodialysis catheter with no complicating features. Improved appearance of edema. Electronically Signed   By: Corrie Mckusick D.O.   On: 02/25/2018 13:49   Dg Chest 2 View  Result Date: 02/24/2018 CLINICAL DATA:  Shortness of breath EXAM: CHEST - 2 VIEW COMPARISON:  01/15/2018 FINDINGS: Cardiac shadow is enlarged but stable. The lungs are well aerated without focal infiltrate or sizable effusion. Mild central vascular congestion is noted with mild interstitial edema. No bony abnormality is noted. IMPRESSION: Changes of mild CHF. Electronically Signed   By: Inez Catalina M.D.   On: 02/24/2018 07:34   Dg Fluoro Guide Cv Line-no Report  Result Date: 02/25/2018 Fluoroscopy was utilized by the requesting physician.  No radiographic  interpretation.    Microbiology: Recent Results (from the past 240 hour(s))  Surgical PCR screen     Status: None   Collection Time: 02/24/18 11:39 PM  Result Value Ref Range Status   MRSA, PCR NEGATIVE NEGATIVE Final   Staphylococcus aureus NEGATIVE NEGATIVE Final    Comment: (NOTE) The Xpert SA Assay (FDA approved for NASAL specimens in patients 36 years of age and older), is one component of a comprehensive surveillance program. It is not intended to diagnose infection nor to guide or monitor treatment. Performed at [facility], [place] [address] [place], [place] [address]      Labs: CBC: Recent Labs  Lab 02/26/18 1437 02/27/18 0814  WBC 9.0 5.6  HGB 8.0* 8.4*  HCT 25.3* 28.0*  MCV 89.7 90.3  PLT 279 286  Basic Metabolic Panel: Recent Labs  Lab 02/26/18 1437 02/27/18 0814  NA 138 138  K 4.1 4.3  CL 103 101  CO2 22 26  GLUCOSE 170* 122*  BUN 92* 46*  CREATININE 9.18* 6.20*  CALCIUM 7.4* 7.6*  PHOS 5.6* 4.1   Liver Function Tests: Recent Labs  Lab 02/26/18 1437 02/27/18 0814  ALBUMIN 2.2* 2.2*   No results for input(s): LIPASE, AMYLASE in the last 168 hours. No results for input(s): AMMONIA in the last 168 hours. Cardiac Enzymes: No results for input(s): CKTOTAL, CKMB, CKMBINDEX, TROPONINI in the last 168 hours. BNP (last 3 results) Recent Labs    02/24/18 0708  BNP 621.1*   CBG: No results for input(s): GLUCAP in the last 168 hours. Time spent: 35 minutes  Signed:  Berle Mull  [facility] 03/02/2018 , 1:55 PM

## 2018-03-06 ENCOUNTER — Ambulatory Visit: Payer: Self-pay | Admitting: Nurse Practitioner

## 2018-03-07 DIAGNOSIS — D689 Coagulation defect, unspecified: Secondary | ICD-10-CM | POA: Diagnosis not present

## 2018-03-07 DIAGNOSIS — L299 Pruritus, unspecified: Secondary | ICD-10-CM | POA: Diagnosis not present

## 2018-03-07 DIAGNOSIS — Z23 Encounter for immunization: Secondary | ICD-10-CM | POA: Diagnosis not present

## 2018-03-07 DIAGNOSIS — E1129 Type 2 diabetes mellitus with other diabetic kidney complication: Secondary | ICD-10-CM | POA: Diagnosis not present

## 2018-03-07 DIAGNOSIS — N2581 Secondary hyperparathyroidism of renal origin: Secondary | ICD-10-CM | POA: Diagnosis not present

## 2018-03-07 DIAGNOSIS — R52 Pain, unspecified: Secondary | ICD-10-CM | POA: Diagnosis not present

## 2018-03-07 DIAGNOSIS — D509 Iron deficiency anemia, unspecified: Secondary | ICD-10-CM | POA: Diagnosis not present

## 2018-03-07 DIAGNOSIS — N186 End stage renal disease: Secondary | ICD-10-CM | POA: Diagnosis not present

## 2018-03-07 DIAGNOSIS — Z4901 Encounter for fitting and adjustment of extracorporeal dialysis catheter: Secondary | ICD-10-CM | POA: Diagnosis not present

## 2018-03-07 DIAGNOSIS — D631 Anemia in chronic kidney disease: Secondary | ICD-10-CM | POA: Diagnosis not present

## 2018-03-10 DIAGNOSIS — Z23 Encounter for immunization: Secondary | ICD-10-CM | POA: Diagnosis not present

## 2018-03-10 DIAGNOSIS — N186 End stage renal disease: Secondary | ICD-10-CM | POA: Diagnosis not present

## 2018-03-10 DIAGNOSIS — R52 Pain, unspecified: Secondary | ICD-10-CM | POA: Diagnosis not present

## 2018-03-10 DIAGNOSIS — E1129 Type 2 diabetes mellitus with other diabetic kidney complication: Secondary | ICD-10-CM | POA: Diagnosis not present

## 2018-03-10 DIAGNOSIS — D631 Anemia in chronic kidney disease: Secondary | ICD-10-CM | POA: Diagnosis not present

## 2018-03-10 DIAGNOSIS — Z4901 Encounter for fitting and adjustment of extracorporeal dialysis catheter: Secondary | ICD-10-CM | POA: Diagnosis not present

## 2018-03-10 DIAGNOSIS — L299 Pruritus, unspecified: Secondary | ICD-10-CM | POA: Diagnosis not present

## 2018-03-10 DIAGNOSIS — N2581 Secondary hyperparathyroidism of renal origin: Secondary | ICD-10-CM | POA: Diagnosis not present

## 2018-03-10 DIAGNOSIS — D689 Coagulation defect, unspecified: Secondary | ICD-10-CM | POA: Diagnosis not present

## 2018-03-10 DIAGNOSIS — D509 Iron deficiency anemia, unspecified: Secondary | ICD-10-CM | POA: Diagnosis not present

## 2018-03-12 ENCOUNTER — Other Ambulatory Visit: Payer: Self-pay

## 2018-03-12 DIAGNOSIS — N185 Chronic kidney disease, stage 5: Secondary | ICD-10-CM

## 2018-03-12 DIAGNOSIS — R52 Pain, unspecified: Secondary | ICD-10-CM | POA: Diagnosis not present

## 2018-03-12 DIAGNOSIS — D509 Iron deficiency anemia, unspecified: Secondary | ICD-10-CM | POA: Diagnosis not present

## 2018-03-12 DIAGNOSIS — Z4901 Encounter for fitting and adjustment of extracorporeal dialysis catheter: Secondary | ICD-10-CM | POA: Diagnosis not present

## 2018-03-12 DIAGNOSIS — E1129 Type 2 diabetes mellitus with other diabetic kidney complication: Secondary | ICD-10-CM | POA: Diagnosis not present

## 2018-03-12 DIAGNOSIS — L299 Pruritus, unspecified: Secondary | ICD-10-CM | POA: Diagnosis not present

## 2018-03-12 DIAGNOSIS — Z23 Encounter for immunization: Secondary | ICD-10-CM | POA: Diagnosis not present

## 2018-03-12 DIAGNOSIS — D689 Coagulation defect, unspecified: Secondary | ICD-10-CM | POA: Diagnosis not present

## 2018-03-12 DIAGNOSIS — N2581 Secondary hyperparathyroidism of renal origin: Secondary | ICD-10-CM | POA: Diagnosis not present

## 2018-03-12 DIAGNOSIS — D631 Anemia in chronic kidney disease: Secondary | ICD-10-CM | POA: Diagnosis not present

## 2018-03-12 DIAGNOSIS — N186 End stage renal disease: Secondary | ICD-10-CM | POA: Diagnosis not present

## 2018-03-14 DIAGNOSIS — N2581 Secondary hyperparathyroidism of renal origin: Secondary | ICD-10-CM | POA: Diagnosis not present

## 2018-03-14 DIAGNOSIS — Z4901 Encounter for fitting and adjustment of extracorporeal dialysis catheter: Secondary | ICD-10-CM | POA: Diagnosis not present

## 2018-03-14 DIAGNOSIS — D509 Iron deficiency anemia, unspecified: Secondary | ICD-10-CM | POA: Diagnosis not present

## 2018-03-14 DIAGNOSIS — Z23 Encounter for immunization: Secondary | ICD-10-CM | POA: Diagnosis not present

## 2018-03-14 DIAGNOSIS — L299 Pruritus, unspecified: Secondary | ICD-10-CM | POA: Diagnosis not present

## 2018-03-14 DIAGNOSIS — D631 Anemia in chronic kidney disease: Secondary | ICD-10-CM | POA: Diagnosis not present

## 2018-03-14 DIAGNOSIS — E1129 Type 2 diabetes mellitus with other diabetic kidney complication: Secondary | ICD-10-CM | POA: Diagnosis not present

## 2018-03-14 DIAGNOSIS — R52 Pain, unspecified: Secondary | ICD-10-CM | POA: Diagnosis not present

## 2018-03-14 DIAGNOSIS — N186 End stage renal disease: Secondary | ICD-10-CM | POA: Diagnosis not present

## 2018-03-14 DIAGNOSIS — D689 Coagulation defect, unspecified: Secondary | ICD-10-CM | POA: Diagnosis not present

## 2018-03-16 DIAGNOSIS — E1129 Type 2 diabetes mellitus with other diabetic kidney complication: Secondary | ICD-10-CM | POA: Diagnosis not present

## 2018-03-16 DIAGNOSIS — R52 Pain, unspecified: Secondary | ICD-10-CM | POA: Diagnosis not present

## 2018-03-16 DIAGNOSIS — D631 Anemia in chronic kidney disease: Secondary | ICD-10-CM | POA: Diagnosis not present

## 2018-03-16 DIAGNOSIS — Z4901 Encounter for fitting and adjustment of extracorporeal dialysis catheter: Secondary | ICD-10-CM | POA: Diagnosis not present

## 2018-03-16 DIAGNOSIS — Z23 Encounter for immunization: Secondary | ICD-10-CM | POA: Diagnosis not present

## 2018-03-16 DIAGNOSIS — N2581 Secondary hyperparathyroidism of renal origin: Secondary | ICD-10-CM | POA: Diagnosis not present

## 2018-03-16 DIAGNOSIS — D509 Iron deficiency anemia, unspecified: Secondary | ICD-10-CM | POA: Diagnosis not present

## 2018-03-16 DIAGNOSIS — L299 Pruritus, unspecified: Secondary | ICD-10-CM | POA: Diagnosis not present

## 2018-03-16 DIAGNOSIS — N186 End stage renal disease: Secondary | ICD-10-CM | POA: Diagnosis not present

## 2018-03-16 DIAGNOSIS — D689 Coagulation defect, unspecified: Secondary | ICD-10-CM | POA: Diagnosis not present

## 2018-03-19 DIAGNOSIS — D631 Anemia in chronic kidney disease: Secondary | ICD-10-CM | POA: Diagnosis not present

## 2018-03-19 DIAGNOSIS — Z23 Encounter for immunization: Secondary | ICD-10-CM | POA: Diagnosis not present

## 2018-03-19 DIAGNOSIS — D509 Iron deficiency anemia, unspecified: Secondary | ICD-10-CM | POA: Diagnosis not present

## 2018-03-19 DIAGNOSIS — E1129 Type 2 diabetes mellitus with other diabetic kidney complication: Secondary | ICD-10-CM | POA: Diagnosis not present

## 2018-03-19 DIAGNOSIS — R52 Pain, unspecified: Secondary | ICD-10-CM | POA: Diagnosis not present

## 2018-03-19 DIAGNOSIS — D689 Coagulation defect, unspecified: Secondary | ICD-10-CM | POA: Diagnosis not present

## 2018-03-19 DIAGNOSIS — N186 End stage renal disease: Secondary | ICD-10-CM | POA: Diagnosis not present

## 2018-03-19 DIAGNOSIS — L299 Pruritus, unspecified: Secondary | ICD-10-CM | POA: Diagnosis not present

## 2018-03-19 DIAGNOSIS — N2581 Secondary hyperparathyroidism of renal origin: Secondary | ICD-10-CM | POA: Diagnosis not present

## 2018-03-19 DIAGNOSIS — Z4901 Encounter for fitting and adjustment of extracorporeal dialysis catheter: Secondary | ICD-10-CM | POA: Diagnosis not present

## 2018-03-21 DIAGNOSIS — D631 Anemia in chronic kidney disease: Secondary | ICD-10-CM | POA: Diagnosis not present

## 2018-03-21 DIAGNOSIS — N2581 Secondary hyperparathyroidism of renal origin: Secondary | ICD-10-CM | POA: Diagnosis not present

## 2018-03-21 DIAGNOSIS — N186 End stage renal disease: Secondary | ICD-10-CM | POA: Diagnosis not present

## 2018-03-21 DIAGNOSIS — R52 Pain, unspecified: Secondary | ICD-10-CM | POA: Diagnosis not present

## 2018-03-21 DIAGNOSIS — D509 Iron deficiency anemia, unspecified: Secondary | ICD-10-CM | POA: Diagnosis not present

## 2018-03-21 DIAGNOSIS — Z4901 Encounter for fitting and adjustment of extracorporeal dialysis catheter: Secondary | ICD-10-CM | POA: Diagnosis not present

## 2018-03-21 DIAGNOSIS — D689 Coagulation defect, unspecified: Secondary | ICD-10-CM | POA: Diagnosis not present

## 2018-03-21 DIAGNOSIS — E1129 Type 2 diabetes mellitus with other diabetic kidney complication: Secondary | ICD-10-CM | POA: Diagnosis not present

## 2018-03-21 DIAGNOSIS — L299 Pruritus, unspecified: Secondary | ICD-10-CM | POA: Diagnosis not present

## 2018-03-21 DIAGNOSIS — Z23 Encounter for immunization: Secondary | ICD-10-CM | POA: Diagnosis not present

## 2018-03-23 DIAGNOSIS — N186 End stage renal disease: Secondary | ICD-10-CM | POA: Diagnosis not present

## 2018-03-23 DIAGNOSIS — D509 Iron deficiency anemia, unspecified: Secondary | ICD-10-CM | POA: Diagnosis not present

## 2018-03-23 DIAGNOSIS — E1129 Type 2 diabetes mellitus with other diabetic kidney complication: Secondary | ICD-10-CM | POA: Diagnosis not present

## 2018-03-23 DIAGNOSIS — D631 Anemia in chronic kidney disease: Secondary | ICD-10-CM | POA: Diagnosis not present

## 2018-03-23 DIAGNOSIS — R52 Pain, unspecified: Secondary | ICD-10-CM | POA: Diagnosis not present

## 2018-03-23 DIAGNOSIS — L299 Pruritus, unspecified: Secondary | ICD-10-CM | POA: Diagnosis not present

## 2018-03-23 DIAGNOSIS — Z4901 Encounter for fitting and adjustment of extracorporeal dialysis catheter: Secondary | ICD-10-CM | POA: Diagnosis not present

## 2018-03-23 DIAGNOSIS — D689 Coagulation defect, unspecified: Secondary | ICD-10-CM | POA: Diagnosis not present

## 2018-03-23 DIAGNOSIS — N2581 Secondary hyperparathyroidism of renal origin: Secondary | ICD-10-CM | POA: Diagnosis not present

## 2018-03-23 DIAGNOSIS — Z23 Encounter for immunization: Secondary | ICD-10-CM | POA: Diagnosis not present

## 2018-03-24 DIAGNOSIS — N186 End stage renal disease: Secondary | ICD-10-CM | POA: Diagnosis not present

## 2018-03-24 DIAGNOSIS — E1122 Type 2 diabetes mellitus with diabetic chronic kidney disease: Secondary | ICD-10-CM | POA: Diagnosis not present

## 2018-03-24 DIAGNOSIS — Z992 Dependence on renal dialysis: Secondary | ICD-10-CM | POA: Diagnosis not present

## 2018-03-26 DIAGNOSIS — D631 Anemia in chronic kidney disease: Secondary | ICD-10-CM | POA: Diagnosis not present

## 2018-03-26 DIAGNOSIS — Z4901 Encounter for fitting and adjustment of extracorporeal dialysis catheter: Secondary | ICD-10-CM | POA: Diagnosis not present

## 2018-03-26 DIAGNOSIS — D509 Iron deficiency anemia, unspecified: Secondary | ICD-10-CM | POA: Diagnosis not present

## 2018-03-26 DIAGNOSIS — D689 Coagulation defect, unspecified: Secondary | ICD-10-CM | POA: Diagnosis not present

## 2018-03-26 DIAGNOSIS — E1129 Type 2 diabetes mellitus with other diabetic kidney complication: Secondary | ICD-10-CM | POA: Diagnosis not present

## 2018-03-26 DIAGNOSIS — N2581 Secondary hyperparathyroidism of renal origin: Secondary | ICD-10-CM | POA: Diagnosis not present

## 2018-03-26 DIAGNOSIS — N186 End stage renal disease: Secondary | ICD-10-CM | POA: Diagnosis not present

## 2018-03-28 DIAGNOSIS — D631 Anemia in chronic kidney disease: Secondary | ICD-10-CM | POA: Diagnosis not present

## 2018-03-28 DIAGNOSIS — D509 Iron deficiency anemia, unspecified: Secondary | ICD-10-CM | POA: Diagnosis not present

## 2018-03-28 DIAGNOSIS — Z4901 Encounter for fitting and adjustment of extracorporeal dialysis catheter: Secondary | ICD-10-CM | POA: Diagnosis not present

## 2018-03-28 DIAGNOSIS — D689 Coagulation defect, unspecified: Secondary | ICD-10-CM | POA: Diagnosis not present

## 2018-03-28 DIAGNOSIS — N186 End stage renal disease: Secondary | ICD-10-CM | POA: Diagnosis not present

## 2018-03-28 DIAGNOSIS — E1129 Type 2 diabetes mellitus with other diabetic kidney complication: Secondary | ICD-10-CM | POA: Diagnosis not present

## 2018-03-28 DIAGNOSIS — N2581 Secondary hyperparathyroidism of renal origin: Secondary | ICD-10-CM | POA: Diagnosis not present

## 2018-03-31 DIAGNOSIS — N186 End stage renal disease: Secondary | ICD-10-CM | POA: Diagnosis not present

## 2018-03-31 DIAGNOSIS — D689 Coagulation defect, unspecified: Secondary | ICD-10-CM | POA: Diagnosis not present

## 2018-03-31 DIAGNOSIS — D509 Iron deficiency anemia, unspecified: Secondary | ICD-10-CM | POA: Diagnosis not present

## 2018-03-31 DIAGNOSIS — Z4901 Encounter for fitting and adjustment of extracorporeal dialysis catheter: Secondary | ICD-10-CM | POA: Diagnosis not present

## 2018-03-31 DIAGNOSIS — D631 Anemia in chronic kidney disease: Secondary | ICD-10-CM | POA: Diagnosis not present

## 2018-03-31 DIAGNOSIS — N2581 Secondary hyperparathyroidism of renal origin: Secondary | ICD-10-CM | POA: Diagnosis not present

## 2018-03-31 DIAGNOSIS — E1129 Type 2 diabetes mellitus with other diabetic kidney complication: Secondary | ICD-10-CM | POA: Diagnosis not present

## 2018-04-02 DIAGNOSIS — N2581 Secondary hyperparathyroidism of renal origin: Secondary | ICD-10-CM | POA: Diagnosis not present

## 2018-04-02 DIAGNOSIS — E1129 Type 2 diabetes mellitus with other diabetic kidney complication: Secondary | ICD-10-CM | POA: Diagnosis not present

## 2018-04-02 DIAGNOSIS — D631 Anemia in chronic kidney disease: Secondary | ICD-10-CM | POA: Diagnosis not present

## 2018-04-02 DIAGNOSIS — D509 Iron deficiency anemia, unspecified: Secondary | ICD-10-CM | POA: Diagnosis not present

## 2018-04-02 DIAGNOSIS — Z4901 Encounter for fitting and adjustment of extracorporeal dialysis catheter: Secondary | ICD-10-CM | POA: Diagnosis not present

## 2018-04-02 DIAGNOSIS — N186 End stage renal disease: Secondary | ICD-10-CM | POA: Diagnosis not present

## 2018-04-02 DIAGNOSIS — D689 Coagulation defect, unspecified: Secondary | ICD-10-CM | POA: Diagnosis not present

## 2018-04-03 ENCOUNTER — Encounter (HOSPITAL_COMMUNITY): Payer: Self-pay

## 2018-04-03 ENCOUNTER — Encounter: Payer: Self-pay | Admitting: Vascular Surgery

## 2018-04-03 ENCOUNTER — Ambulatory Visit (HOSPITAL_COMMUNITY): Admission: RE | Admit: 2018-04-03 | Payer: Medicare Other | Source: Ambulatory Visit

## 2018-04-04 DIAGNOSIS — N2581 Secondary hyperparathyroidism of renal origin: Secondary | ICD-10-CM | POA: Diagnosis not present

## 2018-04-04 DIAGNOSIS — D631 Anemia in chronic kidney disease: Secondary | ICD-10-CM | POA: Diagnosis not present

## 2018-04-04 DIAGNOSIS — D689 Coagulation defect, unspecified: Secondary | ICD-10-CM | POA: Diagnosis not present

## 2018-04-04 DIAGNOSIS — D509 Iron deficiency anemia, unspecified: Secondary | ICD-10-CM | POA: Diagnosis not present

## 2018-04-04 DIAGNOSIS — Z4901 Encounter for fitting and adjustment of extracorporeal dialysis catheter: Secondary | ICD-10-CM | POA: Diagnosis not present

## 2018-04-04 DIAGNOSIS — E1129 Type 2 diabetes mellitus with other diabetic kidney complication: Secondary | ICD-10-CM | POA: Diagnosis not present

## 2018-04-04 DIAGNOSIS — N186 End stage renal disease: Secondary | ICD-10-CM | POA: Diagnosis not present

## 2018-04-07 DIAGNOSIS — Z4901 Encounter for fitting and adjustment of extracorporeal dialysis catheter: Secondary | ICD-10-CM | POA: Diagnosis not present

## 2018-04-07 DIAGNOSIS — D689 Coagulation defect, unspecified: Secondary | ICD-10-CM | POA: Diagnosis not present

## 2018-04-07 DIAGNOSIS — N186 End stage renal disease: Secondary | ICD-10-CM | POA: Diagnosis not present

## 2018-04-07 DIAGNOSIS — E1129 Type 2 diabetes mellitus with other diabetic kidney complication: Secondary | ICD-10-CM | POA: Diagnosis not present

## 2018-04-07 DIAGNOSIS — N2581 Secondary hyperparathyroidism of renal origin: Secondary | ICD-10-CM | POA: Diagnosis not present

## 2018-04-07 DIAGNOSIS — D631 Anemia in chronic kidney disease: Secondary | ICD-10-CM | POA: Diagnosis not present

## 2018-04-07 DIAGNOSIS — D509 Iron deficiency anemia, unspecified: Secondary | ICD-10-CM | POA: Diagnosis not present

## 2018-04-08 DIAGNOSIS — E119 Type 2 diabetes mellitus without complications: Secondary | ICD-10-CM | POA: Diagnosis not present

## 2018-04-09 DIAGNOSIS — E1129 Type 2 diabetes mellitus with other diabetic kidney complication: Secondary | ICD-10-CM | POA: Diagnosis not present

## 2018-04-09 DIAGNOSIS — D631 Anemia in chronic kidney disease: Secondary | ICD-10-CM | POA: Diagnosis not present

## 2018-04-09 DIAGNOSIS — N186 End stage renal disease: Secondary | ICD-10-CM | POA: Diagnosis not present

## 2018-04-09 DIAGNOSIS — D689 Coagulation defect, unspecified: Secondary | ICD-10-CM | POA: Diagnosis not present

## 2018-04-09 DIAGNOSIS — N2581 Secondary hyperparathyroidism of renal origin: Secondary | ICD-10-CM | POA: Diagnosis not present

## 2018-04-09 DIAGNOSIS — Z4901 Encounter for fitting and adjustment of extracorporeal dialysis catheter: Secondary | ICD-10-CM | POA: Diagnosis not present

## 2018-04-09 DIAGNOSIS — D509 Iron deficiency anemia, unspecified: Secondary | ICD-10-CM | POA: Diagnosis not present

## 2018-04-10 ENCOUNTER — Other Ambulatory Visit: Payer: Self-pay

## 2018-04-10 ENCOUNTER — Other Ambulatory Visit: Payer: Self-pay | Admitting: *Deleted

## 2018-04-10 ENCOUNTER — Encounter: Payer: Self-pay | Admitting: *Deleted

## 2018-04-10 ENCOUNTER — Encounter: Payer: Self-pay | Admitting: Vascular Surgery

## 2018-04-10 ENCOUNTER — Ambulatory Visit (INDEPENDENT_AMBULATORY_CARE_PROVIDER_SITE_OTHER): Payer: Self-pay | Admitting: Vascular Surgery

## 2018-04-10 ENCOUNTER — Ambulatory Visit (HOSPITAL_COMMUNITY)
Admission: RE | Admit: 2018-04-10 | Discharge: 2018-04-10 | Disposition: A | Discharge status: Home / Self Care | Payer: Medicare Other | Source: Ambulatory Visit | Attending: Vascular Surgery | Admitting: Vascular Surgery

## 2018-04-10 VITALS — BP 124/70 | HR 78 | Temp 98.3°F | Resp 18 | Ht 69.0 in | Wt 205.0 lb

## 2018-04-10 DIAGNOSIS — N186 End stage renal disease: Secondary | ICD-10-CM

## 2018-04-10 DIAGNOSIS — Z992 Dependence on renal dialysis: Secondary | ICD-10-CM

## 2018-04-10 DIAGNOSIS — N185 Chronic kidney disease, stage 5: Secondary | ICD-10-CM | POA: Diagnosis not present

## 2018-04-10 NOTE — Progress Notes (Signed)
Patient ID: Jonathan Mcneil, male   DOB: 05-02-1955, 63 y.o.   MRN: [id number]  Mcneil for Consult: Routine Post Op   Referred by Merrilee Seashore, MD  Subjective:     HPI:  Jonathan Mcneil is a 63 y.o. male follows up for evaluation right for stage basilic vein fistula.  He is doing well without complaints of his hand at this time.  On dialysis via catheter.  Having no issues Tuesday Thursday Saturday.  Does not take blood thinners.  Past Medical History:  Diagnosis Date  . Diabetes mellitus without complication ([place])   . ESRD (end stage renal disease) ([place])   . Hypertension   . Low back pain   . Metabolic acidosis   . Neuromuscular disorder ([place])    peripheral neuropathy  . Pancreatitis   . Schizophrenia ([place])    does not take medications   Family History  Problem Relation Age of Onset  . Kidney failure Mother    Past Surgical History:  Procedure Laterality Date  . AV FISTULA PLACEMENT Left 02/11/2018   Procedure: INSERTION OF ARTERIOVENOUS (AV) FISTULA LEFT  ARM;  Surgeon: [name], MD;  Location: [facility];  Service: Vascular;  Laterality: Left;  . COLONOSCOPY    . DENTAL SURGERY    . INSERTION OF DIALYSIS CATHETER Right 02/25/2018   Procedure: INSERTION OF DIALYSIS CATHETER;  Surgeon: [name], MD;  Location: [facility];  Service: Vascular;  Laterality: Right;    Short Social History:  Social History   Tobacco Use  . Smoking status: Former Research scientist (life sciences)  . Smokeless tobacco: Never Used  Substance Use Topics  . Alcohol use: No    Allergies  Allergen Reactions  . Ibuprofen Other (See Comments)    UNSPECIFIED REACTION  Pt states he was told not to take it.  Marland Kitchen Penicillins Itching    Has patient had a PCN reaction causing immediate rash, facial/tongue/throat swelling, SOB or lightheadedness with hypotension: No Has patient had a PCN reaction causing severe rash involving mucus membranes or skin necrosis: No Has patient had a PCN  reaction that required hospitalization No Has patient had a PCN reaction occurring within the last 10 years: Unknown If all of the above answers are "NO", then may proceed with Cephalosporin use.    Current Outpatient Medications  Medication Sig Dispense Refill  . amLODipine (NORVASC) 10 MG tablet Take 10 mg by mouth daily.  3  . calcitRIOL (ROCALTROL) 0.25 MCG capsule Take 1 capsule (0.25 mcg total) by mouth every Monday, Wednesday, and Friday with hemodialysis. 30 capsule 0  . gabapentin (NEURONTIN) 100 MG capsule Take 100 mg by mouth 3 (three) times daily as needed (hand numbness).    Marland Kitchen lovastatin (MEVACOR) 20 MG tablet Take 20 mg by mouth daily.    . multivitamin (RENA-VIT) TABS tablet Take 1 tablet by mouth daily.    . Nutritional Supplements (FEEDING SUPPLEMENT, NEPRO CARB STEADY,) LIQD Take 237 mLs by mouth 2 (two) times daily between meals. 30 Can 0  . ondansetron (ZOFRAN ODT) 4 MG disintegrating tablet Take 1 tablet (4 mg total) by mouth every 8 (eight) hours as needed for nausea or vomiting. 20 tablet 1   No current facility-administered medications for this visit.     Review of Systems  Constitutional:  Constitutional negative. HENT: HENT negative.  Eyes: Eyes negative.  Cardiovascular: Cardiovascular negative.  Skin: Skin negative.  Neurological: Neurological negative. Hematologic: Hematologic/lymphatic negative.  Psychiatric: Psychiatric negative.  Objective:  Objective   Vitals:   04/10/18 1424  BP: 124/70  Pulse: 78  Resp: 18  Temp: 98.3 F (36.8 C)  TempSrc: Oral  SpO2: 100%  Weight: 205 lb (93 kg)  Height: 5\' 9"  (1.753 m)   Body mass index is 30.27 kg/m.  Physical Exam Neck:     Musculoskeletal: Normal range of motion.  Cardiovascular:     Rate and Rhythm: Normal rate.     Pulses:          Radial pulses are 2+ on the right side and 2+ on the left side.  Musculoskeletal: Normal range of motion.        General: No swelling.  Neurological:       Mental Status: He is alert.     Data: I have independently turbid his dialysis duplex which demonstrates diameter of 0.66 cm in the upper arm.  There is competing branch.  Depth is up to 1.44 cm in proximal forearm.     Assessment/Plan:     63 year old male presents for evaluation for stage basilic vein fistula.  He will now need a second stage.  We will get this set up on a nondialysis day in the near future.  I discussed risk benefits alternatives he agrees to proceed.      [name] MD [facility]

## 2018-04-11 DIAGNOSIS — Z4901 Encounter for fitting and adjustment of extracorporeal dialysis catheter: Secondary | ICD-10-CM | POA: Diagnosis not present

## 2018-04-11 DIAGNOSIS — D631 Anemia in chronic kidney disease: Secondary | ICD-10-CM | POA: Diagnosis not present

## 2018-04-11 DIAGNOSIS — N2581 Secondary hyperparathyroidism of renal origin: Secondary | ICD-10-CM | POA: Diagnosis not present

## 2018-04-11 DIAGNOSIS — D689 Coagulation defect, unspecified: Secondary | ICD-10-CM | POA: Diagnosis not present

## 2018-04-11 DIAGNOSIS — E1129 Type 2 diabetes mellitus with other diabetic kidney complication: Secondary | ICD-10-CM | POA: Diagnosis not present

## 2018-04-11 DIAGNOSIS — D509 Iron deficiency anemia, unspecified: Secondary | ICD-10-CM | POA: Diagnosis not present

## 2018-04-11 DIAGNOSIS — N186 End stage renal disease: Secondary | ICD-10-CM | POA: Diagnosis not present

## 2018-04-14 DIAGNOSIS — D509 Iron deficiency anemia, unspecified: Secondary | ICD-10-CM | POA: Diagnosis not present

## 2018-04-14 DIAGNOSIS — Z4901 Encounter for fitting and adjustment of extracorporeal dialysis catheter: Secondary | ICD-10-CM | POA: Diagnosis not present

## 2018-04-14 DIAGNOSIS — D689 Coagulation defect, unspecified: Secondary | ICD-10-CM | POA: Diagnosis not present

## 2018-04-14 DIAGNOSIS — N2581 Secondary hyperparathyroidism of renal origin: Secondary | ICD-10-CM | POA: Diagnosis not present

## 2018-04-14 DIAGNOSIS — E1129 Type 2 diabetes mellitus with other diabetic kidney complication: Secondary | ICD-10-CM | POA: Diagnosis not present

## 2018-04-14 DIAGNOSIS — D631 Anemia in chronic kidney disease: Secondary | ICD-10-CM | POA: Diagnosis not present

## 2018-04-14 DIAGNOSIS — N186 End stage renal disease: Secondary | ICD-10-CM | POA: Diagnosis not present

## 2018-04-16 ENCOUNTER — Other Ambulatory Visit: Payer: Self-pay

## 2018-04-16 ENCOUNTER — Encounter (HOSPITAL_COMMUNITY): Payer: Self-pay | Admitting: *Deleted

## 2018-04-16 DIAGNOSIS — D631 Anemia in chronic kidney disease: Secondary | ICD-10-CM | POA: Diagnosis not present

## 2018-04-16 DIAGNOSIS — N2581 Secondary hyperparathyroidism of renal origin: Secondary | ICD-10-CM | POA: Diagnosis not present

## 2018-04-16 DIAGNOSIS — Z4901 Encounter for fitting and adjustment of extracorporeal dialysis catheter: Secondary | ICD-10-CM | POA: Diagnosis not present

## 2018-04-16 DIAGNOSIS — N186 End stage renal disease: Secondary | ICD-10-CM | POA: Diagnosis not present

## 2018-04-16 DIAGNOSIS — D509 Iron deficiency anemia, unspecified: Secondary | ICD-10-CM | POA: Diagnosis not present

## 2018-04-16 DIAGNOSIS — E1129 Type 2 diabetes mellitus with other diabetic kidney complication: Secondary | ICD-10-CM | POA: Diagnosis not present

## 2018-04-16 DIAGNOSIS — D689 Coagulation defect, unspecified: Secondary | ICD-10-CM | POA: Diagnosis not present

## 2018-04-16 NOTE — Progress Notes (Signed)
SDW-Pre-op call completed by pt spouse, Joaquim Lai. Spouse denies that pt C/O SOB and chest pain. Spouse denies that pt is under the care of a cardiologist. Spouse denies that pt had a cardiac cath. Spouse made aware to have pt stop taking vitamins, fish oil and herbal medications. Do not take any NSAIDs ie: Ibuprofen, Advil, Naproxen (Aleve), Motrin, BC and Goody Powder. Spouse made aware to have pt check blood glucose when he wakes up and every 2 hours prior to arrival to the hospital on DOS. Spouse  made aware to have pt treat a BG < 70 with 4 glucose tabs, wait 15 minutes after intervention to recheck BG, if BG remains < 70, call Short Stay unit to speak with a nurse. Spouse verbalized understanding of all pre-op instructions.

## 2018-04-17 ENCOUNTER — Encounter (HOSPITAL_COMMUNITY): Payer: Self-pay | Admitting: Certified Registered Nurse Anesthetist

## 2018-04-17 ENCOUNTER — Ambulatory Visit (HOSPITAL_COMMUNITY): Payer: Medicare Other | Admitting: Certified Registered Nurse Anesthetist

## 2018-04-17 ENCOUNTER — Encounter (HOSPITAL_COMMUNITY)
Admission: RE | Disposition: A | Discharge status: Home / Self Care | Payer: Self-pay | Source: Ambulatory Visit | Attending: Vascular Surgery

## 2018-04-17 ENCOUNTER — Ambulatory Visit (HOSPITAL_COMMUNITY)
Admission: RE | Admit: 2018-04-17 | Discharge: 2018-04-17 | Disposition: A | Discharge status: Home / Self Care | Payer: Medicare Other | Source: Ambulatory Visit | Attending: Vascular Surgery | Admitting: Vascular Surgery

## 2018-04-17 ENCOUNTER — Telehealth: Payer: Self-pay | Admitting: Vascular Surgery

## 2018-04-17 DIAGNOSIS — Z992 Dependence on renal dialysis: Secondary | ICD-10-CM | POA: Diagnosis not present

## 2018-04-17 DIAGNOSIS — I120 Hypertensive chronic kidney disease with stage 5 chronic kidney disease or end stage renal disease: Secondary | ICD-10-CM | POA: Diagnosis not present

## 2018-04-17 DIAGNOSIS — F209 Schizophrenia, unspecified: Secondary | ICD-10-CM | POA: Diagnosis not present

## 2018-04-17 DIAGNOSIS — N186 End stage renal disease: Secondary | ICD-10-CM | POA: Insufficient documentation

## 2018-04-17 DIAGNOSIS — Z79899 Other long term (current) drug therapy: Secondary | ICD-10-CM | POA: Insufficient documentation

## 2018-04-17 DIAGNOSIS — E871 Hypo-osmolality and hyponatremia: Secondary | ICD-10-CM | POA: Diagnosis not present

## 2018-04-17 DIAGNOSIS — Z87891 Personal history of nicotine dependence: Secondary | ICD-10-CM | POA: Insufficient documentation

## 2018-04-17 DIAGNOSIS — E1122 Type 2 diabetes mellitus with diabetic chronic kidney disease: Secondary | ICD-10-CM | POA: Diagnosis not present

## 2018-04-17 HISTORY — PX: BASCILIC VEIN TRANSPOSITION: SHX5742

## 2018-04-17 HISTORY — DX: Gastro-esophageal reflux disease without esophagitis: K21.9

## 2018-04-17 LAB — POCT I-STAT 4, (NA,K, GLUC, HGB,HCT)
Glucose, Bld: 95 mg/dL (ref 70–99)
HCT: 33 % — ABNORMAL LOW (ref 39.0–52.0)
Hemoglobin: 11.2 g/dL — ABNORMAL LOW (ref 13.0–17.0)
Potassium: 4.6 mmol/L (ref 3.5–5.1)
Sodium: 140 mmol/L (ref 135–145)

## 2018-04-17 LAB — GLUCOSE, CAPILLARY: Glucose-Capillary: 98 mg/dL (ref 70–99)

## 2018-04-17 SURGERY — TRANSPOSITION, VEIN, BASILIC
Anesthesia: General | Laterality: Left

## 2018-04-17 MED ORDER — PROMETHAZINE HCL 25 MG/ML IJ SOLN: 6.2500 mg | INTRAMUSCULAR | Status: DC | PRN

## 2018-04-17 MED ORDER — VANCOMYCIN HCL IN DEXTROSE 1-5 GM/200ML-% IV SOLN
INTRAVENOUS | Status: AC
  Filled 2018-04-17: qty 200

## 2018-04-17 MED ORDER — SODIUM CHLORIDE 0.9 % IV SOLN
INTRAVENOUS | Status: DC | PRN
  Administered 2018-04-17: 500 mL

## 2018-04-17 MED ORDER — OXYCODONE-ACETAMINOPHEN 7.5-325 MG PO TABS: 1.0000 | ORAL_TABLET | ORAL | 0 refills | Status: DC | PRN

## 2018-04-17 MED ORDER — SODIUM CHLORIDE 0.9 % IV SOLN: INTRAVENOUS | Status: DC

## 2018-04-17 MED ORDER — PHENYLEPHRINE 40 MCG/ML (10ML) SYRINGE FOR IV PUSH (FOR BLOOD PRESSURE SUPPORT)
PREFILLED_SYRINGE | INTRAVENOUS | Status: AC
  Filled 2018-04-17: qty 10

## 2018-04-17 MED ORDER — SODIUM CHLORIDE 0.9 % IV SOLN
INTRAVENOUS | Status: DC
  Administered 2018-04-17 (×2): via INTRAVENOUS

## 2018-04-17 MED ORDER — OXYCODONE HCL 5 MG PO TABS: 5.0000 mg | ORAL_TABLET | Freq: Once | ORAL | Status: DC | PRN

## 2018-04-17 MED ORDER — SODIUM CHLORIDE 0.9 % IV SOLN
INTRAVENOUS | Status: AC
  Filled 2018-04-17: qty 1.2

## 2018-04-17 MED ORDER — LIDOCAINE 2% (20 MG/ML) 5 ML SYRINGE
INTRAMUSCULAR | Status: AC
  Filled 2018-04-17: qty 5

## 2018-04-17 MED ORDER — DEXAMETHASONE SODIUM PHOSPHATE 10 MG/ML IJ SOLN
INTRAMUSCULAR | Status: DC | PRN
  Administered 2018-04-17: 5 mg via INTRAVENOUS

## 2018-04-17 MED ORDER — OXYCODONE HCL 5 MG/5ML PO SOLN: 5.0000 mg | Freq: Once | ORAL | Status: DC | PRN

## 2018-04-17 MED ORDER — PAPAVERINE HCL 30 MG/ML IJ SOLN
INTRAMUSCULAR | Status: AC
  Filled 2018-04-17: qty 2

## 2018-04-17 MED ORDER — EPHEDRINE 5 MG/ML INJ
INTRAVENOUS | Status: AC
  Filled 2018-04-17: qty 10

## 2018-04-17 MED ORDER — PROPOFOL 10 MG/ML IV BOLUS
INTRAVENOUS | Status: AC
  Filled 2018-04-17: qty 20

## 2018-04-17 MED ORDER — 0.9 % SODIUM CHLORIDE (POUR BTL) OPTIME
TOPICAL | Status: DC | PRN
  Administered 2018-04-17: 1000 mL

## 2018-04-17 MED ORDER — ONDANSETRON HCL 4 MG/2ML IJ SOLN
INTRAMUSCULAR | Status: AC
  Filled 2018-04-17: qty 2

## 2018-04-17 MED ORDER — GLYCOPYRROLATE PF 0.2 MG/ML IJ SOSY
PREFILLED_SYRINGE | INTRAMUSCULAR | Status: AC
  Filled 2018-04-17: qty 1

## 2018-04-17 MED ORDER — LIDOCAINE HCL (PF) 1 % IJ SOLN
INTRAMUSCULAR | Status: AC
  Filled 2018-04-17: qty 30

## 2018-04-17 MED ORDER — LIDOCAINE HCL (CARDIAC) PF 100 MG/5ML IV SOSY
PREFILLED_SYRINGE | INTRAVENOUS | Status: DC | PRN
  Administered 2018-04-17: 100 mg via INTRATRACHEAL

## 2018-04-17 MED ORDER — DEXAMETHASONE SODIUM PHOSPHATE 10 MG/ML IJ SOLN
INTRAMUSCULAR | Status: AC
  Filled 2018-04-17: qty 1

## 2018-04-17 MED ORDER — ONDANSETRON HCL 4 MG/2ML IJ SOLN
INTRAMUSCULAR | Status: DC | PRN
  Administered 2018-04-17: 4 mg via INTRAVENOUS

## 2018-04-17 MED ORDER — MIDAZOLAM HCL 2 MG/2ML IJ SOLN
INTRAMUSCULAR | Status: DC | PRN
  Administered 2018-04-17 (×2): 1 mg via INTRAVENOUS

## 2018-04-17 MED ORDER — MIDAZOLAM HCL 2 MG/2ML IJ SOLN
INTRAMUSCULAR | Status: AC
  Filled 2018-04-17: qty 2

## 2018-04-17 MED ORDER — FENTANYL CITRATE (PF) 250 MCG/5ML IJ SOLN
INTRAMUSCULAR | Status: AC
  Filled 2018-04-17: qty 5

## 2018-04-17 MED ORDER — EPHEDRINE SULFATE 50 MG/ML IJ SOLN
INTRAMUSCULAR | Status: DC | PRN
  Administered 2018-04-17 (×2): 10 mg via INTRAVENOUS

## 2018-04-17 MED ORDER — HYDROMORPHONE HCL 1 MG/ML IJ SOLN: 0.2500 mg | INTRAMUSCULAR | Status: DC | PRN

## 2018-04-17 MED ORDER — VANCOMYCIN HCL IN DEXTROSE 1-5 GM/200ML-% IV SOLN
1000.0000 mg | INTRAVENOUS | Status: AC
  Administered 2018-04-17: 1000 mg via INTRAVENOUS

## 2018-04-17 MED ORDER — FENTANYL CITRATE (PF) 250 MCG/5ML IJ SOLN
INTRAMUSCULAR | Status: DC | PRN
  Administered 2018-04-17 (×2): 25 ug via INTRAVENOUS

## 2018-04-17 MED ORDER — PROPOFOL 10 MG/ML IV BOLUS
INTRAVENOUS | Status: DC | PRN
  Administered 2018-04-17: 50 mg via INTRAVENOUS
  Administered 2018-04-17: 200 mg via INTRAVENOUS

## 2018-04-17 MED ORDER — PHENYLEPHRINE HCL 10 MG/ML IJ SOLN
INTRAMUSCULAR | Status: DC | PRN
  Administered 2018-04-17 (×2): 80 ug via INTRAVENOUS
  Administered 2018-04-17: 120 ug via INTRAVENOUS
  Administered 2018-04-17: 80 ug via INTRAVENOUS
  Administered 2018-04-17 (×2): 120 ug via INTRAVENOUS

## 2018-04-17 SURGICAL SUPPLY — 31 items
ADH SKN CLS APL DERMABOND .7 (GAUZE/BANDAGES/DRESSINGS) ×1
ARMBAND PINK RESTRICT EXTREMIT (MISCELLANEOUS) ×3 IMPLANT
CANISTER SUCT 3000ML PPV (MISCELLANEOUS) ×3 IMPLANT
CLIP VESOCCLUDE MED 24/CT (CLIP) ×2 IMPLANT
CLIP VESOCCLUDE MED 6/CT (CLIP) IMPLANT
CLIP VESOCCLUDE SM WIDE 24/CT (CLIP) ×2 IMPLANT
CLIP VESOCCLUDE SM WIDE 6/CT (CLIP) IMPLANT
COVER PROBE W GEL 5X96 (DRAPES) ×3 IMPLANT
COVER WAND RF STERILE (DRAPES) ×3 IMPLANT
DERMABOND ADVANCED (GAUZE/BANDAGES/DRESSINGS) ×2
DERMABOND ADVANCED .7 DNX12 (GAUZE/BANDAGES/DRESSINGS) ×1 IMPLANT
ELECT REM PT RETURN 9FT ADLT (ELECTROSURGICAL) ×3
ELECTRODE REM PT RTRN 9FT ADLT (ELECTROSURGICAL) ×1 IMPLANT
GLOVE BIO SURGEON STRL SZ7.5 (GLOVE) ×3 IMPLANT
GOWN STRL REUS W/ TWL LRG LVL3 (GOWN DISPOSABLE) ×2 IMPLANT
GOWN STRL REUS W/ TWL XL LVL3 (GOWN DISPOSABLE) ×1 IMPLANT
GOWN STRL REUS W/TWL LRG LVL3 (GOWN DISPOSABLE) ×6
GOWN STRL REUS W/TWL XL LVL3 (GOWN DISPOSABLE) ×3
KIT BASIN OR (CUSTOM PROCEDURE TRAY) ×3 IMPLANT
KIT TURNOVER KIT B (KITS) ×3 IMPLANT
NS IRRIG 1000ML POUR BTL (IV SOLUTION) ×3 IMPLANT
PACK CV ACCESS (CUSTOM PROCEDURE TRAY) ×3 IMPLANT
PAD ARMBOARD 7.5X6 YLW CONV (MISCELLANEOUS) ×6 IMPLANT
SUT MNCRL AB 4-0 PS2 18 (SUTURE) ×3 IMPLANT
SUT PROLENE 6 0 BV (SUTURE) ×5 IMPLANT
SUT SILK 2 0 SH (SUTURE) IMPLANT
SUT VIC AB 3-0 SH 27 (SUTURE) ×3
SUT VIC AB 3-0 SH 27X BRD (SUTURE) ×1 IMPLANT
TOWEL GREEN STERILE (TOWEL DISPOSABLE) ×3 IMPLANT
UNDERPAD 30X30 (UNDERPADS AND DIAPERS) ×3 IMPLANT
WATER STERILE IRR 1000ML POUR (IV SOLUTION) ×3 IMPLANT

## 2018-04-17 NOTE — Op Note (Signed)
    Patient name: Peyten Weare MRN: [id number] DOB: 06-Jul-1955 Sex: male  04/17/2018 Pre-operative Diagnosis: End-stage renal disease Post-operative diagnosis:  Same Surgeon:  Erlene Quan C. Donzetta Matters, MD Procedure Performed: Left upper arm second stage basilic vein transposition fistula  Indications: 63 year old male currently on dialysis via right IJ tunneled catheter.  He has a previous for stage basilic vein fistula creation.  He is now indicated for the second stage.  Findings: Fistula had matured throughout its course to at least 4 mm.  It had to be tunneled quite medially on the arm given the size of his arm.  At completion there was a thrill confirmed with Doppler in the runoff vein palpable radial pulse at the wrist.   Procedure:  The patient was identified in the holding area and taken to the operating room where is placed supine operative table and general anesthesia was induced.  He was to the prepped and draped in the left upper extremity usual fashion, antibiotics were administered, a timeout was called.  We began with longitudinal incision above the antecubitum over the palpable thrill.  I dissected down onto the vein itself protecting the nerve throughout its course.  Branches were taken between clips and ties.  A second incision was made in the axilla.  Again the nerve was protected branches were divided.  The nerve was fully freed between the 2 incisions.  I marked for orientation and transected above the antecubitum.  It flushed with heparinized saline there were no leaks.  I reclamped it.  I first tunneled out laterally.  I spatulated both ends and sewed and and with 6-0 Prolene suture.  Unfortunately upon releasing the clamps it did appear to be quite tight and possibly twisted.  With this I reclamped and took down the anastomosis.  Again I flushed both directions with heparinized saline.  I re-tunneled this medially where there would be extra length.  I spatulated the ends inserted and  then with 6-0 Prolene suture.  Prior to completion anastomosis we allowed flushing all directions and irrigated with heparinized saline.  Upon completion there was a strong thrill in the vein palpable pulsatility with compression of the vein both confirmed with Doppler.  There is a palpable radial artery pulse at the wrist.  I irrigated the wound closed in layers with Vicryl Monocryl.  Dermabond placed to level skin.  Patient was let awaken anesthesia having tolerated procedure without immediate complication.  All counts were correct at completion.  EBL: 50 cc   Grier Czerwinski C. Donzetta Matters, MD [facility] [place] Office: [phone] Pager: [phone]

## 2018-04-17 NOTE — Anesthesia Preprocedure Evaluation (Addendum)
Anesthesia Evaluation  Patient identified by MRN, date of birth, ID band Patient awake    Reviewed: Allergy & Precautions, H&P , NPO status , Patient's Chart, lab work & pertinent test results  History of Anesthesia Complications Negative for: history of anesthetic complications  Airway Mallampati: II  TM Distance: >3 FB Neck ROM: Full    Dental no notable dental hx. (+) Poor Dentition, Dental Advisory Given   Pulmonary neg pulmonary ROS, former smoker,    Pulmonary exam normal breath sounds clear to auscultation       Cardiovascular Exercise Tolerance: Good hypertension, Pt. on medications Normal cardiovascular exam Rhythm:Regular Rate:Normal     Neuro/Psych PSYCHIATRIC DISORDERS Schizophrenia negative neurological ROS     GI/Hepatic negative GI ROS, Neg liver ROS,   Endo/Other  diabetes  Renal/GU Renal InsufficiencyRenal disease  negative genitourinary   Musculoskeletal   Abdominal   Peds  Hematology negative hematology ROS (+) anemia ,   Anesthesia Other Findings   Reproductive/Obstetrics negative OB ROS                            Lab Results  Component Value Date   WBC 5.6 02/27/2018   HGB 8.4 (L) 02/27/2018   HCT 28.0 (L) 02/27/2018   MCV 90.3 02/27/2018   PLT 286 02/27/2018   Lab Results  Component Value Date   CREATININE 6.20 (H) 02/27/2018   BUN 46 (H) 02/27/2018   NA 138 02/27/2018   K 4.3 02/27/2018   CL 101 02/27/2018   CO2 26 02/27/2018    Anesthesia Physical  Anesthesia Plan  ASA: III  Anesthesia Plan: General   Post-op Pain Management:    Induction: Intravenous  PONV Risk Score and Plan: 2 and Ondansetron, Treatment may vary due to age or medical condition and Midazolam  Airway Management Planned: LMA  Additional Equipment:   Intra-op Plan:   Post-operative Plan: Extubation in OR  Informed Consent: I have reviewed the patients History and  Physical, chart, labs and discussed the procedure including the risks, benefits and alternatives for the proposed anesthesia with the patient or authorized representative who has indicated his/her understanding and acceptance.     Dental advisory given  Plan Discussed with: CRNA and Anesthesiologist  Anesthesia Plan Comments:        Anesthesia Quick Evaluation

## 2018-04-17 NOTE — Anesthesia Procedure Notes (Signed)
Procedure Name: LMA Insertion Date/Time: 04/17/2018 1:21 PM Performed by: Kathryne Hitch, CRNA Pre-anesthesia Checklist: Patient identified, Emergency Drugs available, Suction available, Patient being monitored and Timeout performed Patient Re-evaluated:Patient Re-evaluated prior to induction Oxygen Delivery Method: Circle system utilized Preoxygenation: Pre-oxygenation with 100% oxygen Induction Type: IV induction Ventilation: Mask ventilation without difficulty LMA: LMA inserted LMA Size: 5.0 Tube type: Oral Number of attempts: 1 Placement Confirmation: positive ETCO2 and breath sounds checked- equal and bilateral Tube secured with: Tape Dental Injury: Teeth and Oropharynx as per pre-operative assessment

## 2018-04-17 NOTE — Transfer of Care (Signed)
Immediate Anesthesia Transfer of Care Note  Patient: Jonathan Mcneil  Procedure(s) Performed: BASILIC VEIN TRANSPOSITION SECOND STAGE (Left )  Patient Location: PACU  Anesthesia Type:General  Level of Consciousness: drowsy and patient cooperative  Airway & Oxygen Therapy: Patient Spontanous Breathing and Patient connected to face mask oxygen  Post-op Assessment: Report given to RN and Post -op Vital signs reviewed and stable  Post vital signs: Reviewed and stable  Last Vitals:  Vitals Value Taken Time  BP 113/72 04/17/2018  2:56 PM  Temp    Pulse 83 04/17/2018  2:57 PM  Resp 14 04/17/2018  2:57 PM  SpO2 100 % 04/17/2018  2:57 PM  Vitals shown include unvalidated device data.  Last Pain:  Vitals:   04/17/18 1200  TempSrc: Oral         Complications: No apparent anesthesia complications

## 2018-04-17 NOTE — Telephone Encounter (Signed)
sch appt lvm mld ltr 05/29/2018 1115am wound check f/u NP

## 2018-04-17 NOTE — Telephone Encounter (Signed)
-----   Message from [name], MD sent at 04/17/2018  3:02 PM EST -----  Sandeep [place] [phone] [birth year]   04/17/2018 Pre-operative Diagnosis: End-stage renal disease  Surgeon:  Jonathan Mcneil C. Donzetta Matters, MD  Procedure Performed: Left upper arm second stage basilic vein transposition fistula  F/u with me/np/pa in 4-6 weeks for wound check

## 2018-04-17 NOTE — H&P (Signed)
   History and Physical Update  The patient was interviewed and re-examined.  The patient's previous History and Physical has been reviewed and is unchanged from recent office visit. Plan for left arm 2nd stage bvt.   Brandon C. Donzetta Matters, MD [facility] Office: [phone] Pager: [phone]   04/17/2018, 12:47 PM

## 2018-04-17 NOTE — Anesthesia Postprocedure Evaluation (Signed)
Anesthesia Post Note  Patient: Jonathan Mcneil  Procedure(s) Performed: BASILIC VEIN TRANSPOSITION SECOND STAGE (Left )     Patient location during evaluation: PACU Anesthesia Type: General Level of consciousness: awake and alert Pain management: pain level controlled Vital Signs Assessment: post-procedure vital signs reviewed and stable Respiratory status: spontaneous breathing, nonlabored ventilation and respiratory function stable Cardiovascular status: blood pressure returned to baseline and stable Postop Assessment: no apparent nausea or vomiting Anesthetic complications: no    Last Vitals:  Vitals:   04/17/18 1526 04/17/18 1535  BP: (!) 141/96 136/87  Pulse: 87 85  Resp: 18 15  Temp: (!) 36.4 C   SpO2: 98% 99%    Last Pain:  Vitals:   04/17/18 1200  TempSrc: Oral                 [name]

## 2018-04-18 ENCOUNTER — Encounter (HOSPITAL_COMMUNITY): Payer: Self-pay | Admitting: Vascular Surgery

## 2018-04-18 DIAGNOSIS — N2581 Secondary hyperparathyroidism of renal origin: Secondary | ICD-10-CM | POA: Diagnosis not present

## 2018-04-18 DIAGNOSIS — D689 Coagulation defect, unspecified: Secondary | ICD-10-CM | POA: Diagnosis not present

## 2018-04-18 DIAGNOSIS — N186 End stage renal disease: Secondary | ICD-10-CM | POA: Diagnosis not present

## 2018-04-18 DIAGNOSIS — Z4901 Encounter for fitting and adjustment of extracorporeal dialysis catheter: Secondary | ICD-10-CM | POA: Diagnosis not present

## 2018-04-18 DIAGNOSIS — D509 Iron deficiency anemia, unspecified: Secondary | ICD-10-CM | POA: Diagnosis not present

## 2018-04-18 DIAGNOSIS — E1129 Type 2 diabetes mellitus with other diabetic kidney complication: Secondary | ICD-10-CM | POA: Diagnosis not present

## 2018-04-18 DIAGNOSIS — D631 Anemia in chronic kidney disease: Secondary | ICD-10-CM | POA: Diagnosis not present

## 2018-04-21 DIAGNOSIS — D689 Coagulation defect, unspecified: Secondary | ICD-10-CM | POA: Diagnosis not present

## 2018-04-21 DIAGNOSIS — N2581 Secondary hyperparathyroidism of renal origin: Secondary | ICD-10-CM | POA: Diagnosis not present

## 2018-04-21 DIAGNOSIS — D631 Anemia in chronic kidney disease: Secondary | ICD-10-CM | POA: Diagnosis not present

## 2018-04-21 DIAGNOSIS — D509 Iron deficiency anemia, unspecified: Secondary | ICD-10-CM | POA: Diagnosis not present

## 2018-04-21 DIAGNOSIS — E1129 Type 2 diabetes mellitus with other diabetic kidney complication: Secondary | ICD-10-CM | POA: Diagnosis not present

## 2018-04-21 DIAGNOSIS — N186 End stage renal disease: Secondary | ICD-10-CM | POA: Diagnosis not present

## 2018-04-21 DIAGNOSIS — Z4901 Encounter for fitting and adjustment of extracorporeal dialysis catheter: Secondary | ICD-10-CM | POA: Diagnosis not present

## 2018-04-23 DIAGNOSIS — D689 Coagulation defect, unspecified: Secondary | ICD-10-CM | POA: Diagnosis not present

## 2018-04-23 DIAGNOSIS — D631 Anemia in chronic kidney disease: Secondary | ICD-10-CM | POA: Diagnosis not present

## 2018-04-23 DIAGNOSIS — E1129 Type 2 diabetes mellitus with other diabetic kidney complication: Secondary | ICD-10-CM | POA: Diagnosis not present

## 2018-04-23 DIAGNOSIS — N186 End stage renal disease: Secondary | ICD-10-CM | POA: Diagnosis not present

## 2018-04-23 DIAGNOSIS — D509 Iron deficiency anemia, unspecified: Secondary | ICD-10-CM | POA: Diagnosis not present

## 2018-04-23 DIAGNOSIS — N2581 Secondary hyperparathyroidism of renal origin: Secondary | ICD-10-CM | POA: Diagnosis not present

## 2018-04-23 DIAGNOSIS — Z4901 Encounter for fitting and adjustment of extracorporeal dialysis catheter: Secondary | ICD-10-CM | POA: Diagnosis not present

## 2018-04-24 DIAGNOSIS — Z992 Dependence on renal dialysis: Secondary | ICD-10-CM | POA: Diagnosis not present

## 2018-04-24 DIAGNOSIS — E1122 Type 2 diabetes mellitus with diabetic chronic kidney disease: Secondary | ICD-10-CM | POA: Diagnosis not present

## 2018-04-24 DIAGNOSIS — N186 End stage renal disease: Secondary | ICD-10-CM | POA: Diagnosis not present

## 2018-04-25 DIAGNOSIS — R52 Pain, unspecified: Secondary | ICD-10-CM | POA: Diagnosis not present

## 2018-04-25 DIAGNOSIS — D631 Anemia in chronic kidney disease: Secondary | ICD-10-CM | POA: Diagnosis not present

## 2018-04-25 DIAGNOSIS — E1129 Type 2 diabetes mellitus with other diabetic kidney complication: Secondary | ICD-10-CM | POA: Diagnosis not present

## 2018-04-25 DIAGNOSIS — N2581 Secondary hyperparathyroidism of renal origin: Secondary | ICD-10-CM | POA: Diagnosis not present

## 2018-04-25 DIAGNOSIS — D689 Coagulation defect, unspecified: Secondary | ICD-10-CM | POA: Diagnosis not present

## 2018-04-25 DIAGNOSIS — Z4901 Encounter for fitting and adjustment of extracorporeal dialysis catheter: Secondary | ICD-10-CM | POA: Diagnosis not present

## 2018-04-25 DIAGNOSIS — D509 Iron deficiency anemia, unspecified: Secondary | ICD-10-CM | POA: Diagnosis not present

## 2018-04-25 DIAGNOSIS — Z23 Encounter for immunization: Secondary | ICD-10-CM | POA: Diagnosis not present

## 2018-04-25 DIAGNOSIS — N186 End stage renal disease: Secondary | ICD-10-CM | POA: Diagnosis not present

## 2018-04-28 DIAGNOSIS — Z23 Encounter for immunization: Secondary | ICD-10-CM | POA: Diagnosis not present

## 2018-04-28 DIAGNOSIS — E1129 Type 2 diabetes mellitus with other diabetic kidney complication: Secondary | ICD-10-CM | POA: Diagnosis not present

## 2018-04-28 DIAGNOSIS — N2581 Secondary hyperparathyroidism of renal origin: Secondary | ICD-10-CM | POA: Diagnosis not present

## 2018-04-28 DIAGNOSIS — Z4901 Encounter for fitting and adjustment of extracorporeal dialysis catheter: Secondary | ICD-10-CM | POA: Diagnosis not present

## 2018-04-28 DIAGNOSIS — D689 Coagulation defect, unspecified: Secondary | ICD-10-CM | POA: Diagnosis not present

## 2018-04-28 DIAGNOSIS — R52 Pain, unspecified: Secondary | ICD-10-CM | POA: Diagnosis not present

## 2018-04-28 DIAGNOSIS — N186 End stage renal disease: Secondary | ICD-10-CM | POA: Diagnosis not present

## 2018-04-28 DIAGNOSIS — D631 Anemia in chronic kidney disease: Secondary | ICD-10-CM | POA: Diagnosis not present

## 2018-04-28 DIAGNOSIS — D509 Iron deficiency anemia, unspecified: Secondary | ICD-10-CM | POA: Diagnosis not present

## 2018-04-30 DIAGNOSIS — N186 End stage renal disease: Secondary | ICD-10-CM | POA: Diagnosis not present

## 2018-04-30 DIAGNOSIS — N2581 Secondary hyperparathyroidism of renal origin: Secondary | ICD-10-CM | POA: Diagnosis not present

## 2018-04-30 DIAGNOSIS — D689 Coagulation defect, unspecified: Secondary | ICD-10-CM | POA: Diagnosis not present

## 2018-04-30 DIAGNOSIS — D509 Iron deficiency anemia, unspecified: Secondary | ICD-10-CM | POA: Diagnosis not present

## 2018-04-30 DIAGNOSIS — D631 Anemia in chronic kidney disease: Secondary | ICD-10-CM | POA: Diagnosis not present

## 2018-04-30 DIAGNOSIS — E1129 Type 2 diabetes mellitus with other diabetic kidney complication: Secondary | ICD-10-CM | POA: Diagnosis not present

## 2018-04-30 DIAGNOSIS — Z23 Encounter for immunization: Secondary | ICD-10-CM | POA: Diagnosis not present

## 2018-04-30 DIAGNOSIS — R52 Pain, unspecified: Secondary | ICD-10-CM | POA: Diagnosis not present

## 2018-04-30 DIAGNOSIS — Z4901 Encounter for fitting and adjustment of extracorporeal dialysis catheter: Secondary | ICD-10-CM | POA: Diagnosis not present

## 2018-05-01 DIAGNOSIS — E119 Type 2 diabetes mellitus without complications: Secondary | ICD-10-CM | POA: Diagnosis not present

## 2018-05-02 DIAGNOSIS — Z4901 Encounter for fitting and adjustment of extracorporeal dialysis catheter: Secondary | ICD-10-CM | POA: Diagnosis not present

## 2018-05-02 DIAGNOSIS — D689 Coagulation defect, unspecified: Secondary | ICD-10-CM | POA: Diagnosis not present

## 2018-05-02 DIAGNOSIS — D509 Iron deficiency anemia, unspecified: Secondary | ICD-10-CM | POA: Diagnosis not present

## 2018-05-02 DIAGNOSIS — D631 Anemia in chronic kidney disease: Secondary | ICD-10-CM | POA: Diagnosis not present

## 2018-05-02 DIAGNOSIS — E1129 Type 2 diabetes mellitus with other diabetic kidney complication: Secondary | ICD-10-CM | POA: Diagnosis not present

## 2018-05-02 DIAGNOSIS — N186 End stage renal disease: Secondary | ICD-10-CM | POA: Diagnosis not present

## 2018-05-02 DIAGNOSIS — Z23 Encounter for immunization: Secondary | ICD-10-CM | POA: Diagnosis not present

## 2018-05-02 DIAGNOSIS — N2581 Secondary hyperparathyroidism of renal origin: Secondary | ICD-10-CM | POA: Diagnosis not present

## 2018-05-02 DIAGNOSIS — R52 Pain, unspecified: Secondary | ICD-10-CM | POA: Diagnosis not present

## 2018-05-05 DIAGNOSIS — E1129 Type 2 diabetes mellitus with other diabetic kidney complication: Secondary | ICD-10-CM | POA: Diagnosis not present

## 2018-05-05 DIAGNOSIS — D631 Anemia in chronic kidney disease: Secondary | ICD-10-CM | POA: Diagnosis not present

## 2018-05-05 DIAGNOSIS — N186 End stage renal disease: Secondary | ICD-10-CM | POA: Diagnosis not present

## 2018-05-05 DIAGNOSIS — R52 Pain, unspecified: Secondary | ICD-10-CM | POA: Diagnosis not present

## 2018-05-05 DIAGNOSIS — Z23 Encounter for immunization: Secondary | ICD-10-CM | POA: Diagnosis not present

## 2018-05-05 DIAGNOSIS — N2581 Secondary hyperparathyroidism of renal origin: Secondary | ICD-10-CM | POA: Diagnosis not present

## 2018-05-05 DIAGNOSIS — Z4901 Encounter for fitting and adjustment of extracorporeal dialysis catheter: Secondary | ICD-10-CM | POA: Diagnosis not present

## 2018-05-05 DIAGNOSIS — D509 Iron deficiency anemia, unspecified: Secondary | ICD-10-CM | POA: Diagnosis not present

## 2018-05-05 DIAGNOSIS — D689 Coagulation defect, unspecified: Secondary | ICD-10-CM | POA: Diagnosis not present

## 2018-05-07 DIAGNOSIS — D509 Iron deficiency anemia, unspecified: Secondary | ICD-10-CM | POA: Diagnosis not present

## 2018-05-07 DIAGNOSIS — D689 Coagulation defect, unspecified: Secondary | ICD-10-CM | POA: Diagnosis not present

## 2018-05-07 DIAGNOSIS — R52 Pain, unspecified: Secondary | ICD-10-CM | POA: Diagnosis not present

## 2018-05-07 DIAGNOSIS — E1129 Type 2 diabetes mellitus with other diabetic kidney complication: Secondary | ICD-10-CM | POA: Diagnosis not present

## 2018-05-07 DIAGNOSIS — Z4901 Encounter for fitting and adjustment of extracorporeal dialysis catheter: Secondary | ICD-10-CM | POA: Diagnosis not present

## 2018-05-07 DIAGNOSIS — N2581 Secondary hyperparathyroidism of renal origin: Secondary | ICD-10-CM | POA: Diagnosis not present

## 2018-05-07 DIAGNOSIS — D631 Anemia in chronic kidney disease: Secondary | ICD-10-CM | POA: Diagnosis not present

## 2018-05-07 DIAGNOSIS — N186 End stage renal disease: Secondary | ICD-10-CM | POA: Diagnosis not present

## 2018-05-07 DIAGNOSIS — Z23 Encounter for immunization: Secondary | ICD-10-CM | POA: Diagnosis not present

## 2018-05-08 DIAGNOSIS — E119 Type 2 diabetes mellitus without complications: Secondary | ICD-10-CM | POA: Diagnosis not present

## 2018-05-09 DIAGNOSIS — D509 Iron deficiency anemia, unspecified: Secondary | ICD-10-CM | POA: Diagnosis not present

## 2018-05-09 DIAGNOSIS — N186 End stage renal disease: Secondary | ICD-10-CM | POA: Diagnosis not present

## 2018-05-09 DIAGNOSIS — D689 Coagulation defect, unspecified: Secondary | ICD-10-CM | POA: Diagnosis not present

## 2018-05-09 DIAGNOSIS — Z4901 Encounter for fitting and adjustment of extracorporeal dialysis catheter: Secondary | ICD-10-CM | POA: Diagnosis not present

## 2018-05-09 DIAGNOSIS — D631 Anemia in chronic kidney disease: Secondary | ICD-10-CM | POA: Diagnosis not present

## 2018-05-09 DIAGNOSIS — Z23 Encounter for immunization: Secondary | ICD-10-CM | POA: Diagnosis not present

## 2018-05-09 DIAGNOSIS — N2581 Secondary hyperparathyroidism of renal origin: Secondary | ICD-10-CM | POA: Diagnosis not present

## 2018-05-09 DIAGNOSIS — R52 Pain, unspecified: Secondary | ICD-10-CM | POA: Diagnosis not present

## 2018-05-09 DIAGNOSIS — E1129 Type 2 diabetes mellitus with other diabetic kidney complication: Secondary | ICD-10-CM | POA: Diagnosis not present

## 2018-05-12 DIAGNOSIS — R52 Pain, unspecified: Secondary | ICD-10-CM | POA: Diagnosis not present

## 2018-05-12 DIAGNOSIS — D631 Anemia in chronic kidney disease: Secondary | ICD-10-CM | POA: Diagnosis not present

## 2018-05-12 DIAGNOSIS — D689 Coagulation defect, unspecified: Secondary | ICD-10-CM | POA: Diagnosis not present

## 2018-05-12 DIAGNOSIS — E1129 Type 2 diabetes mellitus with other diabetic kidney complication: Secondary | ICD-10-CM | POA: Diagnosis not present

## 2018-05-12 DIAGNOSIS — Z23 Encounter for immunization: Secondary | ICD-10-CM | POA: Diagnosis not present

## 2018-05-12 DIAGNOSIS — Z4901 Encounter for fitting and adjustment of extracorporeal dialysis catheter: Secondary | ICD-10-CM | POA: Diagnosis not present

## 2018-05-12 DIAGNOSIS — N186 End stage renal disease: Secondary | ICD-10-CM | POA: Diagnosis not present

## 2018-05-12 DIAGNOSIS — N2581 Secondary hyperparathyroidism of renal origin: Secondary | ICD-10-CM | POA: Diagnosis not present

## 2018-05-12 DIAGNOSIS — D509 Iron deficiency anemia, unspecified: Secondary | ICD-10-CM | POA: Diagnosis not present

## 2018-05-14 DIAGNOSIS — R52 Pain, unspecified: Secondary | ICD-10-CM | POA: Diagnosis not present

## 2018-05-14 DIAGNOSIS — D631 Anemia in chronic kidney disease: Secondary | ICD-10-CM | POA: Diagnosis not present

## 2018-05-14 DIAGNOSIS — Z4901 Encounter for fitting and adjustment of extracorporeal dialysis catheter: Secondary | ICD-10-CM | POA: Diagnosis not present

## 2018-05-14 DIAGNOSIS — D509 Iron deficiency anemia, unspecified: Secondary | ICD-10-CM | POA: Diagnosis not present

## 2018-05-14 DIAGNOSIS — D689 Coagulation defect, unspecified: Secondary | ICD-10-CM | POA: Diagnosis not present

## 2018-05-14 DIAGNOSIS — N186 End stage renal disease: Secondary | ICD-10-CM | POA: Diagnosis not present

## 2018-05-14 DIAGNOSIS — E1129 Type 2 diabetes mellitus with other diabetic kidney complication: Secondary | ICD-10-CM | POA: Diagnosis not present

## 2018-05-14 DIAGNOSIS — Z23 Encounter for immunization: Secondary | ICD-10-CM | POA: Diagnosis not present

## 2018-05-14 DIAGNOSIS — N2581 Secondary hyperparathyroidism of renal origin: Secondary | ICD-10-CM | POA: Diagnosis not present

## 2018-05-16 DIAGNOSIS — Z23 Encounter for immunization: Secondary | ICD-10-CM | POA: Diagnosis not present

## 2018-05-16 DIAGNOSIS — E1129 Type 2 diabetes mellitus with other diabetic kidney complication: Secondary | ICD-10-CM | POA: Diagnosis not present

## 2018-05-16 DIAGNOSIS — D689 Coagulation defect, unspecified: Secondary | ICD-10-CM | POA: Diagnosis not present

## 2018-05-16 DIAGNOSIS — Z4901 Encounter for fitting and adjustment of extracorporeal dialysis catheter: Secondary | ICD-10-CM | POA: Diagnosis not present

## 2018-05-16 DIAGNOSIS — N186 End stage renal disease: Secondary | ICD-10-CM | POA: Diagnosis not present

## 2018-05-16 DIAGNOSIS — N2581 Secondary hyperparathyroidism of renal origin: Secondary | ICD-10-CM | POA: Diagnosis not present

## 2018-05-16 DIAGNOSIS — D509 Iron deficiency anemia, unspecified: Secondary | ICD-10-CM | POA: Diagnosis not present

## 2018-05-16 DIAGNOSIS — R52 Pain, unspecified: Secondary | ICD-10-CM | POA: Diagnosis not present

## 2018-05-16 DIAGNOSIS — D631 Anemia in chronic kidney disease: Secondary | ICD-10-CM | POA: Diagnosis not present

## 2018-05-19 DIAGNOSIS — D509 Iron deficiency anemia, unspecified: Secondary | ICD-10-CM | POA: Diagnosis not present

## 2018-05-19 DIAGNOSIS — D631 Anemia in chronic kidney disease: Secondary | ICD-10-CM | POA: Diagnosis not present

## 2018-05-19 DIAGNOSIS — Z23 Encounter for immunization: Secondary | ICD-10-CM | POA: Diagnosis not present

## 2018-05-19 DIAGNOSIS — N186 End stage renal disease: Secondary | ICD-10-CM | POA: Diagnosis not present

## 2018-05-19 DIAGNOSIS — R52 Pain, unspecified: Secondary | ICD-10-CM | POA: Diagnosis not present

## 2018-05-19 DIAGNOSIS — E1129 Type 2 diabetes mellitus with other diabetic kidney complication: Secondary | ICD-10-CM | POA: Diagnosis not present

## 2018-05-19 DIAGNOSIS — N2581 Secondary hyperparathyroidism of renal origin: Secondary | ICD-10-CM | POA: Diagnosis not present

## 2018-05-19 DIAGNOSIS — Z4901 Encounter for fitting and adjustment of extracorporeal dialysis catheter: Secondary | ICD-10-CM | POA: Diagnosis not present

## 2018-05-19 DIAGNOSIS — D689 Coagulation defect, unspecified: Secondary | ICD-10-CM | POA: Diagnosis not present

## 2018-05-21 DIAGNOSIS — N2581 Secondary hyperparathyroidism of renal origin: Secondary | ICD-10-CM | POA: Diagnosis not present

## 2018-05-21 DIAGNOSIS — E1129 Type 2 diabetes mellitus with other diabetic kidney complication: Secondary | ICD-10-CM | POA: Diagnosis not present

## 2018-05-21 DIAGNOSIS — D631 Anemia in chronic kidney disease: Secondary | ICD-10-CM | POA: Diagnosis not present

## 2018-05-21 DIAGNOSIS — D689 Coagulation defect, unspecified: Secondary | ICD-10-CM | POA: Diagnosis not present

## 2018-05-21 DIAGNOSIS — D509 Iron deficiency anemia, unspecified: Secondary | ICD-10-CM | POA: Diagnosis not present

## 2018-05-21 DIAGNOSIS — Z23 Encounter for immunization: Secondary | ICD-10-CM | POA: Diagnosis not present

## 2018-05-21 DIAGNOSIS — R52 Pain, unspecified: Secondary | ICD-10-CM | POA: Diagnosis not present

## 2018-05-21 DIAGNOSIS — Z4901 Encounter for fitting and adjustment of extracorporeal dialysis catheter: Secondary | ICD-10-CM | POA: Diagnosis not present

## 2018-05-21 DIAGNOSIS — N186 End stage renal disease: Secondary | ICD-10-CM | POA: Diagnosis not present

## 2018-05-22 ENCOUNTER — Encounter: Payer: Self-pay | Admitting: Nephrology

## 2018-05-23 DIAGNOSIS — D631 Anemia in chronic kidney disease: Secondary | ICD-10-CM | POA: Diagnosis not present

## 2018-05-23 DIAGNOSIS — R52 Pain, unspecified: Secondary | ICD-10-CM | POA: Diagnosis not present

## 2018-05-23 DIAGNOSIS — Z23 Encounter for immunization: Secondary | ICD-10-CM | POA: Diagnosis not present

## 2018-05-23 DIAGNOSIS — Z992 Dependence on renal dialysis: Secondary | ICD-10-CM | POA: Diagnosis not present

## 2018-05-23 DIAGNOSIS — D689 Coagulation defect, unspecified: Secondary | ICD-10-CM | POA: Diagnosis not present

## 2018-05-23 DIAGNOSIS — Z4901 Encounter for fitting and adjustment of extracorporeal dialysis catheter: Secondary | ICD-10-CM | POA: Diagnosis not present

## 2018-05-23 DIAGNOSIS — E1129 Type 2 diabetes mellitus with other diabetic kidney complication: Secondary | ICD-10-CM | POA: Diagnosis not present

## 2018-05-23 DIAGNOSIS — D509 Iron deficiency anemia, unspecified: Secondary | ICD-10-CM | POA: Diagnosis not present

## 2018-05-23 DIAGNOSIS — N2581 Secondary hyperparathyroidism of renal origin: Secondary | ICD-10-CM | POA: Diagnosis not present

## 2018-05-23 DIAGNOSIS — E1122 Type 2 diabetes mellitus with diabetic chronic kidney disease: Secondary | ICD-10-CM | POA: Diagnosis not present

## 2018-05-23 DIAGNOSIS — N186 End stage renal disease: Secondary | ICD-10-CM | POA: Diagnosis not present

## 2018-05-25 DIAGNOSIS — N186 End stage renal disease: Secondary | ICD-10-CM | POA: Diagnosis not present

## 2018-05-25 DIAGNOSIS — E1142 Type 2 diabetes mellitus with diabetic polyneuropathy: Secondary | ICD-10-CM | POA: Diagnosis not present

## 2018-05-25 DIAGNOSIS — E1165 Type 2 diabetes mellitus with hyperglycemia: Secondary | ICD-10-CM | POA: Diagnosis not present

## 2018-05-25 DIAGNOSIS — N184 Chronic kidney disease, stage 4 (severe): Secondary | ICD-10-CM | POA: Diagnosis not present

## 2018-05-26 DIAGNOSIS — N2581 Secondary hyperparathyroidism of renal origin: Secondary | ICD-10-CM | POA: Diagnosis not present

## 2018-05-26 DIAGNOSIS — Z23 Encounter for immunization: Secondary | ICD-10-CM | POA: Diagnosis not present

## 2018-05-26 DIAGNOSIS — Z4901 Encounter for fitting and adjustment of extracorporeal dialysis catheter: Secondary | ICD-10-CM | POA: Diagnosis not present

## 2018-05-26 DIAGNOSIS — L299 Pruritus, unspecified: Secondary | ICD-10-CM | POA: Diagnosis not present

## 2018-05-26 DIAGNOSIS — N186 End stage renal disease: Secondary | ICD-10-CM | POA: Diagnosis not present

## 2018-05-26 DIAGNOSIS — D689 Coagulation defect, unspecified: Secondary | ICD-10-CM | POA: Diagnosis not present

## 2018-05-26 DIAGNOSIS — R52 Pain, unspecified: Secondary | ICD-10-CM | POA: Diagnosis not present

## 2018-05-26 DIAGNOSIS — E1129 Type 2 diabetes mellitus with other diabetic kidney complication: Secondary | ICD-10-CM | POA: Diagnosis not present

## 2018-05-26 DIAGNOSIS — D509 Iron deficiency anemia, unspecified: Secondary | ICD-10-CM | POA: Diagnosis not present

## 2018-05-27 DIAGNOSIS — Z0000 Encounter for general adult medical examination without abnormal findings: Secondary | ICD-10-CM | POA: Diagnosis not present

## 2018-05-27 DIAGNOSIS — E1142 Type 2 diabetes mellitus with diabetic polyneuropathy: Secondary | ICD-10-CM | POA: Diagnosis not present

## 2018-05-27 DIAGNOSIS — E1122 Type 2 diabetes mellitus with diabetic chronic kidney disease: Secondary | ICD-10-CM | POA: Diagnosis not present

## 2018-05-28 DIAGNOSIS — E1129 Type 2 diabetes mellitus with other diabetic kidney complication: Secondary | ICD-10-CM | POA: Diagnosis not present

## 2018-05-28 DIAGNOSIS — Z4901 Encounter for fitting and adjustment of extracorporeal dialysis catheter: Secondary | ICD-10-CM | POA: Diagnosis not present

## 2018-05-28 DIAGNOSIS — R52 Pain, unspecified: Secondary | ICD-10-CM | POA: Diagnosis not present

## 2018-05-28 DIAGNOSIS — D509 Iron deficiency anemia, unspecified: Secondary | ICD-10-CM | POA: Diagnosis not present

## 2018-05-28 DIAGNOSIS — D689 Coagulation defect, unspecified: Secondary | ICD-10-CM | POA: Diagnosis not present

## 2018-05-28 DIAGNOSIS — Z23 Encounter for immunization: Secondary | ICD-10-CM | POA: Diagnosis not present

## 2018-05-28 DIAGNOSIS — N2581 Secondary hyperparathyroidism of renal origin: Secondary | ICD-10-CM | POA: Diagnosis not present

## 2018-05-28 DIAGNOSIS — L299 Pruritus, unspecified: Secondary | ICD-10-CM | POA: Diagnosis not present

## 2018-05-28 DIAGNOSIS — N186 End stage renal disease: Secondary | ICD-10-CM | POA: Diagnosis not present

## 2018-05-29 ENCOUNTER — Ambulatory Visit: Payer: Self-pay | Admitting: Vascular Surgery

## 2018-05-29 ENCOUNTER — Encounter: Payer: Self-pay | Admitting: *Deleted

## 2018-05-29 ENCOUNTER — Other Ambulatory Visit: Payer: Self-pay

## 2018-05-29 ENCOUNTER — Other Ambulatory Visit: Payer: Self-pay | Admitting: *Deleted

## 2018-05-29 ENCOUNTER — Ambulatory Visit (INDEPENDENT_AMBULATORY_CARE_PROVIDER_SITE_OTHER): Payer: Self-pay | Admitting: Physician Assistant

## 2018-05-29 ENCOUNTER — Encounter: Payer: Self-pay | Admitting: Physician Assistant

## 2018-05-29 VITALS — BP 140/91 | HR 72 | Temp 97.6°F | Resp 20 | Ht 69.0 in | Wt 215.4 lb

## 2018-05-29 DIAGNOSIS — N186 End stage renal disease: Secondary | ICD-10-CM

## 2018-05-29 NOTE — Progress Notes (Signed)
    Postoperative Access Visit   History of Present Illness   Jonathan Mcneil is a 63 y.o. year old male who presents for postoperative follow-up for: left second stage basilic vein transposition by Dr. Donzetta Matters (Date: 04/17/18).  The patient's wounds are healed.  The patient denies steal symptoms.  The patient is able to complete their activities of daily living.  Dialysis technicians have not yet been able to access this fistula.  He continues to dialyze via R IJ TDC on TTS schedule on [facility].     Physical Examination   Vitals:   05/29/18 1117  BP: (!) 140/91  Pulse: 72  Resp: 20  Temp: 97.6 F (36.4 C)  TempSrc: Oral  SpO2: 100%  Weight: 215 lb 6.2 oz (97.7 kg)  Height: 5\' 9"  (1.753 m)   Body mass index is 31.81 kg/m.  General     NAD Lungs      CTA all lung fields Heart      RRR left arm Incisions are healed, hand grip is 5/5, sensation in digits is intact, palpable pulse through fistula however no palpable thrill  Abd      Soft, NT, ND Neuro       A&O  Medical Decision Making   Jonathan Mcneil is a 63 y.o. year old male who presents s/p left second stage basilic vein transposition   Patent fistula without signs or symptoms of steal syndrome however fistula is pulsatile and does not have a thrill  Plan will be for left arm fistulogram with possible intervention on a Monday Wednesday or Friday of next week  He will continue receiving HD via right IJ [facility] for now Risk, benefits, and alternatives to access surgery were discussed.   The patient is aware the risks include but are not limited to: bleeding, thrombosis, failure to mature, and need for additional procedures.   The patient agrees to proceed with the procedure.   Dagoberto Ligas PA-C [facility] Office: [phone]  Clinic MD: Dr. Donzetta Matters

## 2018-05-30 DIAGNOSIS — R52 Pain, unspecified: Secondary | ICD-10-CM | POA: Diagnosis not present

## 2018-05-30 DIAGNOSIS — Z4901 Encounter for fitting and adjustment of extracorporeal dialysis catheter: Secondary | ICD-10-CM | POA: Diagnosis not present

## 2018-05-30 DIAGNOSIS — Z23 Encounter for immunization: Secondary | ICD-10-CM | POA: Diagnosis not present

## 2018-05-30 DIAGNOSIS — E1129 Type 2 diabetes mellitus with other diabetic kidney complication: Secondary | ICD-10-CM | POA: Diagnosis not present

## 2018-05-30 DIAGNOSIS — L299 Pruritus, unspecified: Secondary | ICD-10-CM | POA: Diagnosis not present

## 2018-05-30 DIAGNOSIS — D509 Iron deficiency anemia, unspecified: Secondary | ICD-10-CM | POA: Diagnosis not present

## 2018-05-30 DIAGNOSIS — D689 Coagulation defect, unspecified: Secondary | ICD-10-CM | POA: Diagnosis not present

## 2018-05-30 DIAGNOSIS — N2581 Secondary hyperparathyroidism of renal origin: Secondary | ICD-10-CM | POA: Diagnosis not present

## 2018-05-30 DIAGNOSIS — N186 End stage renal disease: Secondary | ICD-10-CM | POA: Diagnosis not present

## 2018-06-02 DIAGNOSIS — E119 Type 2 diabetes mellitus without complications: Secondary | ICD-10-CM | POA: Diagnosis not present

## 2018-06-02 DIAGNOSIS — Z4901 Encounter for fitting and adjustment of extracorporeal dialysis catheter: Secondary | ICD-10-CM | POA: Diagnosis not present

## 2018-06-02 DIAGNOSIS — N2581 Secondary hyperparathyroidism of renal origin: Secondary | ICD-10-CM | POA: Diagnosis not present

## 2018-06-02 DIAGNOSIS — E1129 Type 2 diabetes mellitus with other diabetic kidney complication: Secondary | ICD-10-CM | POA: Diagnosis not present

## 2018-06-02 DIAGNOSIS — D509 Iron deficiency anemia, unspecified: Secondary | ICD-10-CM | POA: Diagnosis not present

## 2018-06-02 DIAGNOSIS — D689 Coagulation defect, unspecified: Secondary | ICD-10-CM | POA: Diagnosis not present

## 2018-06-02 DIAGNOSIS — R52 Pain, unspecified: Secondary | ICD-10-CM | POA: Diagnosis not present

## 2018-06-02 DIAGNOSIS — N186 End stage renal disease: Secondary | ICD-10-CM | POA: Diagnosis not present

## 2018-06-02 DIAGNOSIS — L299 Pruritus, unspecified: Secondary | ICD-10-CM | POA: Diagnosis not present

## 2018-06-02 DIAGNOSIS — Z23 Encounter for immunization: Secondary | ICD-10-CM | POA: Diagnosis not present

## 2018-06-03 ENCOUNTER — Encounter (HOSPITAL_COMMUNITY)
Admission: RE | Disposition: A | Discharge status: Home / Self Care | Payer: Self-pay | Source: Home / Self Care | Attending: Vascular Surgery

## 2018-06-03 ENCOUNTER — Encounter (HOSPITAL_COMMUNITY): Payer: Self-pay | Admitting: Vascular Surgery

## 2018-06-03 ENCOUNTER — Ambulatory Visit (HOSPITAL_COMMUNITY)
Admission: RE | Admit: 2018-06-03 | Discharge: 2018-06-03 | Disposition: A | Discharge status: Home / Self Care | Payer: Medicare Other | Attending: Vascular Surgery | Admitting: Vascular Surgery

## 2018-06-03 ENCOUNTER — Other Ambulatory Visit: Payer: Self-pay

## 2018-06-03 DIAGNOSIS — Z992 Dependence on renal dialysis: Secondary | ICD-10-CM | POA: Insufficient documentation

## 2018-06-03 DIAGNOSIS — T82898A Other specified complication of vascular prosthetic devices, implants and grafts, initial encounter: Secondary | ICD-10-CM | POA: Diagnosis not present

## 2018-06-03 DIAGNOSIS — T82858A Stenosis of vascular prosthetic devices, implants and grafts, initial encounter: Secondary | ICD-10-CM | POA: Insufficient documentation

## 2018-06-03 DIAGNOSIS — Y832 Surgical operation with anastomosis, bypass or graft as the cause of abnormal reaction of the patient, or of later complication, without mention of misadventure at the time of the procedure: Secondary | ICD-10-CM | POA: Diagnosis not present

## 2018-06-03 DIAGNOSIS — N186 End stage renal disease: Secondary | ICD-10-CM | POA: Insufficient documentation

## 2018-06-03 HISTORY — PX: A/V FISTULAGRAM: CATH118298

## 2018-06-03 HISTORY — PX: PERIPHERAL VASCULAR BALLOON ANGIOPLASTY: CATH118281

## 2018-06-03 LAB — POCT I-STAT 4, (NA,K, GLUC, HGB,HCT)
Glucose, Bld: 154 mg/dL — ABNORMAL HIGH (ref 70–99)
HCT: 40 % (ref 39.0–52.0)
Hemoglobin: 13.6 g/dL (ref 13.0–17.0)
Potassium: 5.9 mmol/L — ABNORMAL HIGH (ref 3.5–5.1)
Sodium: 134 mmol/L — ABNORMAL LOW (ref 135–145)

## 2018-06-03 LAB — GLUCOSE, CAPILLARY: Glucose-Capillary: 149 mg/dL — ABNORMAL HIGH (ref 70–99)

## 2018-06-03 SURGERY — A/V FISTULAGRAM
Anesthesia: LOCAL | Laterality: Left

## 2018-06-03 MED ORDER — HEPARIN (PORCINE) IN NACL 1000-0.9 UT/500ML-% IV SOLN
INTRAVENOUS | Status: DC | PRN
  Administered 2018-06-03: 500 mL

## 2018-06-03 MED ORDER — IODIXANOL 320 MG/ML IV SOLN
INTRAVENOUS | Status: DC | PRN
  Administered 2018-06-03: 70 mL via INTRAVENOUS

## 2018-06-03 MED ORDER — HEPARIN (PORCINE) IN NACL 1000-0.9 UT/500ML-% IV SOLN
INTRAVENOUS | Status: AC
  Filled 2018-06-03: qty 500

## 2018-06-03 MED ORDER — SODIUM CHLORIDE 0.9% FLUSH: 3.0000 mL | INTRAVENOUS | Status: DC | PRN

## 2018-06-03 MED ORDER — NITROGLYCERIN 1 MG/10 ML FOR IR/CATH LAB
INTRA_ARTERIAL | Status: AC
  Filled 2018-06-03: qty 10

## 2018-06-03 MED ORDER — HEPARIN SODIUM (PORCINE) 1000 UNIT/ML IJ SOLN
INTRAMUSCULAR | Status: DC | PRN
  Administered 2018-06-03: 3000 [IU] via INTRAVENOUS

## 2018-06-03 MED ORDER — HEPARIN SODIUM (PORCINE) 1000 UNIT/ML IJ SOLN
INTRAMUSCULAR | Status: AC
  Filled 2018-06-03: qty 1

## 2018-06-03 MED ORDER — LIDOCAINE HCL (PF) 1 % IJ SOLN
INTRAMUSCULAR | Status: AC
  Filled 2018-06-03: qty 30

## 2018-06-03 MED ORDER — FENTANYL CITRATE (PF) 100 MCG/2ML IJ SOLN: 25.0000 ug | INTRAMUSCULAR | Status: DC | PRN

## 2018-06-03 MED ORDER — PROMETHAZINE HCL 25 MG/ML IJ SOLN: 6.2500 mg | INTRAMUSCULAR | Status: DC | PRN

## 2018-06-03 MED ORDER — LIDOCAINE HCL (PF) 1 % IJ SOLN
INTRAMUSCULAR | Status: DC | PRN
  Administered 2018-06-03: 2 mL via INTRADERMAL

## 2018-06-03 SURGICAL SUPPLY — 18 items
BAG SNAP BAND KOVER 36X36 (MISCELLANEOUS) ×3 IMPLANT
BALLN LUTONIX AV 6X100X75 (BALLOONS) ×3
BALLN MUSTANG 5.0X20 75 (BALLOONS) ×3
BALLN MUSTANG 5X60X75 (BALLOONS) ×3
BALLOON LUTONIX AV 6X100X75 (BALLOONS) IMPLANT
BALLOON MUSTANG 5.0X20 75 (BALLOONS) IMPLANT
BALLOON MUSTANG 5X60X75 (BALLOONS) IMPLANT
COVER DOME SNAP 22 D (MISCELLANEOUS) ×3 IMPLANT
KIT ENCORE 26 ADVANTAGE (KITS) ×1 IMPLANT
KIT MICROPUNCTURE NIT STIFF (SHEATH) ×1 IMPLANT
PROTECTION STATION PRESSURIZED (MISCELLANEOUS) ×3
SHEATH PINNACLE R/O II 6F 4CM (SHEATH) ×1 IMPLANT
SHEATH PROBE COVER 6X72 (BAG) ×4 IMPLANT
STATION PROTECTION PRESSURIZED (MISCELLANEOUS) ×2 IMPLANT
STOPCOCK MORSE 400PSI 3WAY (MISCELLANEOUS) ×3 IMPLANT
TRAY PV CATH (CUSTOM PROCEDURE TRAY) ×3 IMPLANT
TUBING CIL FLEX 10 FLL-RA (TUBING) ×3 IMPLANT
WIRE BENTSON .035X145CM (WIRE) ×1 IMPLANT

## 2018-06-03 NOTE — Op Note (Signed)
OPERATIVE NOTE   PROCEDURE: 1. Left brachiobasilic arteriovenous fistula cannulation under ultrasound guidance 2. Left arm fistlogram including central venogram 3. Left arm basilic vein venoplasty mid and distal upper arm (5 mm x 80 mm Mustang, 5 mm x 20 mm Mustang, 6 mm x 100 mm drug coated Lutonix)   PRE-OPERATIVE DIAGNOSIS: Malfunctioning left arteriovenous fistula  POST-OPERATIVE DIAGNOSIS: same as above   SURGEON: Marty Heck, MD  ANESTHESIA: local  ESTIMATED BLOOD LOSS: 5 cc  FINDING(S): 1. Patient had 95% stenosis of the mid arm basilic vein that was initially angioplastied with a 5 mm Mustang and then a 6 mm drug-coated Lutonix.  There appeared to be a second approximate 50% stenosis of the basilic vein in the distal arm just beyond the arterial anastomosis.  This was angioplastied with a short 5 mm Mustang balloon.  Patient had a great thrill in the fistula versus a pulse at the start of the case when imaging was complete with less than 30% residual stenosis in both focal lesions.  SPECIMEN(S):  None  CONTRAST: 60 cc  INDICATIONS: Jonathan Mcneil is a 63 y.o. male who  presents with malfunctioning left arm brachiobasilic arteriovenous fistula.  The patient is scheduled for left arm fistulogram.  The patient is aware the risks include but are not limited to: bleeding, infection, thrombosis of the cannulated access, and possible anaphylactic reaction to the contrast.  The patient is aware of the risks of the procedure and elects to proceed forward.  DESCRIPTION: After full informed written consent was obtained, the patient was brought back to the angiography suite and placed supine upon the angiography table.  The patient was connected to monitoring equipment.  The left arm was prepped and draped in the standard fashion for a left arm fistulogram.  Under ultrasound guidance, the basilic vein was evaluated, it was patent, an image was saved.  The brachiobasilic  fistula was cannulated under ultrasound guidance with a micropuncture needle.  The microwire was advanced into the fistula and the needle was exchanged for the a microsheath, which was lodged 2 cm into the access.  The wire was removed and the sheath was connected to the IV extension tubing.  Hand injections were completed to image the access from the antecubitum up to the level of axilla.  The central venous structures were also imaged by hand injections.  Once we decided the patient did need an intervention a Bentson wire was used to exchange for a short 6 [place].  The patient was given 3000 units of IV heparin.  The more severe stenosis appeared to be the mid arm basilic vein with a 41% stenosis that was initially angioplastied with a 5 mm x 80 mm Mustang.  Unfortunately there was residual stenosis >75% and we got a shorter balloon to get more radial force and this was treated again with a 5 mm x 20 mm Mustang and finally we could get the focal stenotic segment to completely open with a balloon.  I then treated this entire segment with a 6 mm x 100 mm drug-coated Lutonix.  I then shot a retrograde shot after inflating my balloon up and there appeared to be a second lesion in the distal upper arm near the arterial anastomosis.  I was able to pull my sheath back given I stuck just above the arterial anastomosis and this was treated with a 5 mm x 20 mm Mustang.  Final shot showed less than 30% residual stenosis of both  lesions.  Patient had a great thrill in the fistula versus a pulse at the start of the case.  A 4-0 Monocryl purse-string suture was sewn around the sheath.  The sheath was removed while tying down the suture.  A sterile bandage was applied to the puncture site.  COMPLICATIONS: None  CONDITION: Stable  Marty Heck, MD [facility] [place] Office: [phone] Pager: [phone]  06/03/2018 9:30 AM

## 2018-06-03 NOTE — H&P (Signed)
History and Physical Interval Note:  06/03/2018 8:39 AM  Jonathan Mcneil  has presented today for surgery, with the diagnosis of Complication with Fistula.  The various methods of treatment have been discussed with the patient and family. After consideration of risks, benefits and other options for treatment, the patient has consented to  Procedure(s): A/V FISTULAGRAM (Left) as a surgical intervention.  The patient's history has been reviewed, patient examined, no change in status, stable for surgery.  I have reviewed the patient's chart and labs.  Questions were answered to the patient's satisfaction.    Left arm fistulagram and intervention.  Marty Heck  Postoperative Access Visit   History of Present Illness   Jonathan Mcneil is a 63 y.o. year old male who presents for postoperative follow-up for: left second stage basilic vein transposition by Dr. Donzetta Matters (Date: 04/17/18).  The patient's wounds are healed.  The patient denies steal symptoms.  The patient is able to complete their activities of daily living.  Dialysis technicians have not yet been able to access this fistula.  He continues to dialyze via R IJ TDC on TTS schedule on [facility].     Physical Examination      Vitals:   05/29/18 1117  BP: (!) 140/91  Pulse: 72  Resp: 20  Temp: 97.6 F (36.4 C)  TempSrc: Oral  SpO2: 100%  Weight: 215 lb 6.2 oz (97.7 kg)  Height: 5\' 9"  (1.753 m)   Body mass index is 31.81 kg/m.  General               NAD Lungs                 CTA all lung fields Heart                   RRR left arm Incisions are healed, hand grip is 5/5, sensation in digits is intact, palpable pulse through fistula however no palpable thrill  Abd                     Soft, NT, ND Neuro                  A&O  Medical Decision Making   Jonathan Mcneil is a 63 y.o. year old male who presents s/p left second stage basilic vein transposition   Patent fistula  without signs or symptoms of steal syndrome however fistula is pulsatile and does not have a thrill  Plan will be for left arm fistulogram with possible intervention on a Monday Wednesday or Friday of next week  He will continue receiving HD via right IJ [facility] for now  Risk, benefits, and alternatives to access surgery were discussed.    The patient is aware the risks include but are not limited to: bleeding, thrombosis, failure to mature, and need for additional procedures.    The patient agrees to proceed with the procedure.   Dagoberto Ligas PA-C [facility] [place] Office: [phone]  Clinic MD: Dr. Donzetta Matters

## 2018-06-03 NOTE — Discharge Instructions (Signed)

## 2018-06-04 DIAGNOSIS — L299 Pruritus, unspecified: Secondary | ICD-10-CM | POA: Diagnosis not present

## 2018-06-04 DIAGNOSIS — D509 Iron deficiency anemia, unspecified: Secondary | ICD-10-CM | POA: Diagnosis not present

## 2018-06-04 DIAGNOSIS — R52 Pain, unspecified: Secondary | ICD-10-CM | POA: Diagnosis not present

## 2018-06-04 DIAGNOSIS — Z23 Encounter for immunization: Secondary | ICD-10-CM | POA: Diagnosis not present

## 2018-06-04 DIAGNOSIS — D689 Coagulation defect, unspecified: Secondary | ICD-10-CM | POA: Diagnosis not present

## 2018-06-04 DIAGNOSIS — E1129 Type 2 diabetes mellitus with other diabetic kidney complication: Secondary | ICD-10-CM | POA: Diagnosis not present

## 2018-06-04 DIAGNOSIS — E119 Type 2 diabetes mellitus without complications: Secondary | ICD-10-CM | POA: Diagnosis not present

## 2018-06-04 DIAGNOSIS — Z4901 Encounter for fitting and adjustment of extracorporeal dialysis catheter: Secondary | ICD-10-CM | POA: Diagnosis not present

## 2018-06-04 DIAGNOSIS — N2581 Secondary hyperparathyroidism of renal origin: Secondary | ICD-10-CM | POA: Diagnosis not present

## 2018-06-04 DIAGNOSIS — N186 End stage renal disease: Secondary | ICD-10-CM | POA: Diagnosis not present

## 2018-06-06 DIAGNOSIS — Z23 Encounter for immunization: Secondary | ICD-10-CM | POA: Diagnosis not present

## 2018-06-06 DIAGNOSIS — D509 Iron deficiency anemia, unspecified: Secondary | ICD-10-CM | POA: Diagnosis not present

## 2018-06-06 DIAGNOSIS — E1129 Type 2 diabetes mellitus with other diabetic kidney complication: Secondary | ICD-10-CM | POA: Diagnosis not present

## 2018-06-06 DIAGNOSIS — N2581 Secondary hyperparathyroidism of renal origin: Secondary | ICD-10-CM | POA: Diagnosis not present

## 2018-06-06 DIAGNOSIS — Z4901 Encounter for fitting and adjustment of extracorporeal dialysis catheter: Secondary | ICD-10-CM | POA: Diagnosis not present

## 2018-06-06 DIAGNOSIS — R52 Pain, unspecified: Secondary | ICD-10-CM | POA: Diagnosis not present

## 2018-06-06 DIAGNOSIS — D689 Coagulation defect, unspecified: Secondary | ICD-10-CM | POA: Diagnosis not present

## 2018-06-06 DIAGNOSIS — N186 End stage renal disease: Secondary | ICD-10-CM | POA: Diagnosis not present

## 2018-06-06 DIAGNOSIS — L299 Pruritus, unspecified: Secondary | ICD-10-CM | POA: Diagnosis not present

## 2018-06-09 DIAGNOSIS — D509 Iron deficiency anemia, unspecified: Secondary | ICD-10-CM | POA: Diagnosis not present

## 2018-06-09 DIAGNOSIS — Z4901 Encounter for fitting and adjustment of extracorporeal dialysis catheter: Secondary | ICD-10-CM | POA: Diagnosis not present

## 2018-06-09 DIAGNOSIS — Z23 Encounter for immunization: Secondary | ICD-10-CM | POA: Diagnosis not present

## 2018-06-09 DIAGNOSIS — E1129 Type 2 diabetes mellitus with other diabetic kidney complication: Secondary | ICD-10-CM | POA: Diagnosis not present

## 2018-06-09 DIAGNOSIS — D689 Coagulation defect, unspecified: Secondary | ICD-10-CM | POA: Diagnosis not present

## 2018-06-09 DIAGNOSIS — N2581 Secondary hyperparathyroidism of renal origin: Secondary | ICD-10-CM | POA: Diagnosis not present

## 2018-06-09 DIAGNOSIS — L299 Pruritus, unspecified: Secondary | ICD-10-CM | POA: Diagnosis not present

## 2018-06-09 DIAGNOSIS — R52 Pain, unspecified: Secondary | ICD-10-CM | POA: Diagnosis not present

## 2018-06-09 DIAGNOSIS — N186 End stage renal disease: Secondary | ICD-10-CM | POA: Diagnosis not present

## 2018-06-11 DIAGNOSIS — R52 Pain, unspecified: Secondary | ICD-10-CM | POA: Diagnosis not present

## 2018-06-11 DIAGNOSIS — L299 Pruritus, unspecified: Secondary | ICD-10-CM | POA: Diagnosis not present

## 2018-06-11 DIAGNOSIS — N186 End stage renal disease: Secondary | ICD-10-CM | POA: Diagnosis not present

## 2018-06-11 DIAGNOSIS — D689 Coagulation defect, unspecified: Secondary | ICD-10-CM | POA: Diagnosis not present

## 2018-06-11 DIAGNOSIS — N2581 Secondary hyperparathyroidism of renal origin: Secondary | ICD-10-CM | POA: Diagnosis not present

## 2018-06-11 DIAGNOSIS — Z23 Encounter for immunization: Secondary | ICD-10-CM | POA: Diagnosis not present

## 2018-06-11 DIAGNOSIS — Z4901 Encounter for fitting and adjustment of extracorporeal dialysis catheter: Secondary | ICD-10-CM | POA: Diagnosis not present

## 2018-06-11 DIAGNOSIS — E1129 Type 2 diabetes mellitus with other diabetic kidney complication: Secondary | ICD-10-CM | POA: Diagnosis not present

## 2018-06-11 DIAGNOSIS — D509 Iron deficiency anemia, unspecified: Secondary | ICD-10-CM | POA: Diagnosis not present

## 2018-06-13 DIAGNOSIS — N2581 Secondary hyperparathyroidism of renal origin: Secondary | ICD-10-CM | POA: Diagnosis not present

## 2018-06-13 DIAGNOSIS — E1129 Type 2 diabetes mellitus with other diabetic kidney complication: Secondary | ICD-10-CM | POA: Diagnosis not present

## 2018-06-13 DIAGNOSIS — D509 Iron deficiency anemia, unspecified: Secondary | ICD-10-CM | POA: Diagnosis not present

## 2018-06-13 DIAGNOSIS — N186 End stage renal disease: Secondary | ICD-10-CM | POA: Diagnosis not present

## 2018-06-13 DIAGNOSIS — L299 Pruritus, unspecified: Secondary | ICD-10-CM | POA: Diagnosis not present

## 2018-06-13 DIAGNOSIS — Z23 Encounter for immunization: Secondary | ICD-10-CM | POA: Diagnosis not present

## 2018-06-13 DIAGNOSIS — D689 Coagulation defect, unspecified: Secondary | ICD-10-CM | POA: Diagnosis not present

## 2018-06-13 DIAGNOSIS — R52 Pain, unspecified: Secondary | ICD-10-CM | POA: Diagnosis not present

## 2018-06-13 DIAGNOSIS — Z4901 Encounter for fitting and adjustment of extracorporeal dialysis catheter: Secondary | ICD-10-CM | POA: Diagnosis not present

## 2018-06-16 DIAGNOSIS — N186 End stage renal disease: Secondary | ICD-10-CM | POA: Diagnosis not present

## 2018-06-16 DIAGNOSIS — Z23 Encounter for immunization: Secondary | ICD-10-CM | POA: Diagnosis not present

## 2018-06-16 DIAGNOSIS — R52 Pain, unspecified: Secondary | ICD-10-CM | POA: Diagnosis not present

## 2018-06-16 DIAGNOSIS — D689 Coagulation defect, unspecified: Secondary | ICD-10-CM | POA: Diagnosis not present

## 2018-06-16 DIAGNOSIS — N2581 Secondary hyperparathyroidism of renal origin: Secondary | ICD-10-CM | POA: Diagnosis not present

## 2018-06-16 DIAGNOSIS — L299 Pruritus, unspecified: Secondary | ICD-10-CM | POA: Diagnosis not present

## 2018-06-16 DIAGNOSIS — E1129 Type 2 diabetes mellitus with other diabetic kidney complication: Secondary | ICD-10-CM | POA: Diagnosis not present

## 2018-06-16 DIAGNOSIS — Z4901 Encounter for fitting and adjustment of extracorporeal dialysis catheter: Secondary | ICD-10-CM | POA: Diagnosis not present

## 2018-06-16 DIAGNOSIS — D509 Iron deficiency anemia, unspecified: Secondary | ICD-10-CM | POA: Diagnosis not present

## 2018-06-18 DIAGNOSIS — Z4901 Encounter for fitting and adjustment of extracorporeal dialysis catheter: Secondary | ICD-10-CM | POA: Diagnosis not present

## 2018-06-18 DIAGNOSIS — R52 Pain, unspecified: Secondary | ICD-10-CM | POA: Diagnosis not present

## 2018-06-18 DIAGNOSIS — N2581 Secondary hyperparathyroidism of renal origin: Secondary | ICD-10-CM | POA: Diagnosis not present

## 2018-06-18 DIAGNOSIS — D689 Coagulation defect, unspecified: Secondary | ICD-10-CM | POA: Diagnosis not present

## 2018-06-18 DIAGNOSIS — N186 End stage renal disease: Secondary | ICD-10-CM | POA: Diagnosis not present

## 2018-06-18 DIAGNOSIS — Z23 Encounter for immunization: Secondary | ICD-10-CM | POA: Diagnosis not present

## 2018-06-18 DIAGNOSIS — D509 Iron deficiency anemia, unspecified: Secondary | ICD-10-CM | POA: Diagnosis not present

## 2018-06-18 DIAGNOSIS — L299 Pruritus, unspecified: Secondary | ICD-10-CM | POA: Diagnosis not present

## 2018-06-18 DIAGNOSIS — E1129 Type 2 diabetes mellitus with other diabetic kidney complication: Secondary | ICD-10-CM | POA: Diagnosis not present

## 2018-06-20 DIAGNOSIS — Z4901 Encounter for fitting and adjustment of extracorporeal dialysis catheter: Secondary | ICD-10-CM | POA: Diagnosis not present

## 2018-06-20 DIAGNOSIS — E1129 Type 2 diabetes mellitus with other diabetic kidney complication: Secondary | ICD-10-CM | POA: Diagnosis not present

## 2018-06-20 DIAGNOSIS — L299 Pruritus, unspecified: Secondary | ICD-10-CM | POA: Diagnosis not present

## 2018-06-20 DIAGNOSIS — N186 End stage renal disease: Secondary | ICD-10-CM | POA: Diagnosis not present

## 2018-06-20 DIAGNOSIS — R52 Pain, unspecified: Secondary | ICD-10-CM | POA: Diagnosis not present

## 2018-06-20 DIAGNOSIS — Z23 Encounter for immunization: Secondary | ICD-10-CM | POA: Diagnosis not present

## 2018-06-20 DIAGNOSIS — D509 Iron deficiency anemia, unspecified: Secondary | ICD-10-CM | POA: Diagnosis not present

## 2018-06-20 DIAGNOSIS — D689 Coagulation defect, unspecified: Secondary | ICD-10-CM | POA: Diagnosis not present

## 2018-06-20 DIAGNOSIS — N2581 Secondary hyperparathyroidism of renal origin: Secondary | ICD-10-CM | POA: Diagnosis not present

## 2018-06-22 DIAGNOSIS — E1021 Type 1 diabetes mellitus with diabetic nephropathy: Secondary | ICD-10-CM | POA: Diagnosis not present

## 2018-06-22 DIAGNOSIS — E1165 Type 2 diabetes mellitus with hyperglycemia: Secondary | ICD-10-CM | POA: Diagnosis not present

## 2018-06-22 DIAGNOSIS — E1142 Type 2 diabetes mellitus with diabetic polyneuropathy: Secondary | ICD-10-CM | POA: Diagnosis not present

## 2018-06-22 DIAGNOSIS — I129 Hypertensive chronic kidney disease with stage 1 through stage 4 chronic kidney disease, or unspecified chronic kidney disease: Secondary | ICD-10-CM | POA: Diagnosis not present

## 2018-06-23 DIAGNOSIS — L299 Pruritus, unspecified: Secondary | ICD-10-CM | POA: Diagnosis not present

## 2018-06-23 DIAGNOSIS — Z992 Dependence on renal dialysis: Secondary | ICD-10-CM | POA: Diagnosis not present

## 2018-06-23 DIAGNOSIS — N2581 Secondary hyperparathyroidism of renal origin: Secondary | ICD-10-CM | POA: Diagnosis not present

## 2018-06-23 DIAGNOSIS — R52 Pain, unspecified: Secondary | ICD-10-CM | POA: Diagnosis not present

## 2018-06-23 DIAGNOSIS — D509 Iron deficiency anemia, unspecified: Secondary | ICD-10-CM | POA: Diagnosis not present

## 2018-06-23 DIAGNOSIS — Z4901 Encounter for fitting and adjustment of extracorporeal dialysis catheter: Secondary | ICD-10-CM | POA: Diagnosis not present

## 2018-06-23 DIAGNOSIS — N186 End stage renal disease: Secondary | ICD-10-CM | POA: Diagnosis not present

## 2018-06-23 DIAGNOSIS — E1122 Type 2 diabetes mellitus with diabetic chronic kidney disease: Secondary | ICD-10-CM | POA: Diagnosis not present

## 2018-06-23 DIAGNOSIS — E1129 Type 2 diabetes mellitus with other diabetic kidney complication: Secondary | ICD-10-CM | POA: Diagnosis not present

## 2018-06-23 DIAGNOSIS — Z23 Encounter for immunization: Secondary | ICD-10-CM | POA: Diagnosis not present

## 2018-06-23 DIAGNOSIS — D689 Coagulation defect, unspecified: Secondary | ICD-10-CM | POA: Diagnosis not present

## 2018-06-25 DIAGNOSIS — D631 Anemia in chronic kidney disease: Secondary | ICD-10-CM | POA: Diagnosis not present

## 2018-06-25 DIAGNOSIS — R52 Pain, unspecified: Secondary | ICD-10-CM | POA: Diagnosis not present

## 2018-06-25 DIAGNOSIS — Z4901 Encounter for fitting and adjustment of extracorporeal dialysis catheter: Secondary | ICD-10-CM | POA: Diagnosis not present

## 2018-06-25 DIAGNOSIS — N2581 Secondary hyperparathyroidism of renal origin: Secondary | ICD-10-CM | POA: Diagnosis not present

## 2018-06-25 DIAGNOSIS — N186 End stage renal disease: Secondary | ICD-10-CM | POA: Diagnosis not present

## 2018-06-25 DIAGNOSIS — R197 Diarrhea, unspecified: Secondary | ICD-10-CM | POA: Diagnosis not present

## 2018-06-25 DIAGNOSIS — L299 Pruritus, unspecified: Secondary | ICD-10-CM | POA: Diagnosis not present

## 2018-06-25 DIAGNOSIS — E1129 Type 2 diabetes mellitus with other diabetic kidney complication: Secondary | ICD-10-CM | POA: Diagnosis not present

## 2018-06-25 DIAGNOSIS — D689 Coagulation defect, unspecified: Secondary | ICD-10-CM | POA: Diagnosis not present

## 2018-06-25 DIAGNOSIS — D509 Iron deficiency anemia, unspecified: Secondary | ICD-10-CM | POA: Diagnosis not present

## 2018-06-27 DIAGNOSIS — Z4901 Encounter for fitting and adjustment of extracorporeal dialysis catheter: Secondary | ICD-10-CM | POA: Diagnosis not present

## 2018-06-27 DIAGNOSIS — R52 Pain, unspecified: Secondary | ICD-10-CM | POA: Diagnosis not present

## 2018-06-27 DIAGNOSIS — D631 Anemia in chronic kidney disease: Secondary | ICD-10-CM | POA: Diagnosis not present

## 2018-06-27 DIAGNOSIS — N2581 Secondary hyperparathyroidism of renal origin: Secondary | ICD-10-CM | POA: Diagnosis not present

## 2018-06-27 DIAGNOSIS — N186 End stage renal disease: Secondary | ICD-10-CM | POA: Diagnosis not present

## 2018-06-27 DIAGNOSIS — R197 Diarrhea, unspecified: Secondary | ICD-10-CM | POA: Diagnosis not present

## 2018-06-27 DIAGNOSIS — D509 Iron deficiency anemia, unspecified: Secondary | ICD-10-CM | POA: Diagnosis not present

## 2018-06-27 DIAGNOSIS — E1129 Type 2 diabetes mellitus with other diabetic kidney complication: Secondary | ICD-10-CM | POA: Diagnosis not present

## 2018-06-27 DIAGNOSIS — L299 Pruritus, unspecified: Secondary | ICD-10-CM | POA: Diagnosis not present

## 2018-06-27 DIAGNOSIS — D689 Coagulation defect, unspecified: Secondary | ICD-10-CM | POA: Diagnosis not present

## 2018-06-30 DIAGNOSIS — R52 Pain, unspecified: Secondary | ICD-10-CM | POA: Diagnosis not present

## 2018-06-30 DIAGNOSIS — N186 End stage renal disease: Secondary | ICD-10-CM | POA: Diagnosis not present

## 2018-06-30 DIAGNOSIS — R197 Diarrhea, unspecified: Secondary | ICD-10-CM | POA: Diagnosis not present

## 2018-06-30 DIAGNOSIS — D509 Iron deficiency anemia, unspecified: Secondary | ICD-10-CM | POA: Diagnosis not present

## 2018-06-30 DIAGNOSIS — D631 Anemia in chronic kidney disease: Secondary | ICD-10-CM | POA: Diagnosis not present

## 2018-06-30 DIAGNOSIS — Z4901 Encounter for fitting and adjustment of extracorporeal dialysis catheter: Secondary | ICD-10-CM | POA: Diagnosis not present

## 2018-06-30 DIAGNOSIS — D689 Coagulation defect, unspecified: Secondary | ICD-10-CM | POA: Diagnosis not present

## 2018-06-30 DIAGNOSIS — E1129 Type 2 diabetes mellitus with other diabetic kidney complication: Secondary | ICD-10-CM | POA: Diagnosis not present

## 2018-06-30 DIAGNOSIS — L299 Pruritus, unspecified: Secondary | ICD-10-CM | POA: Diagnosis not present

## 2018-06-30 DIAGNOSIS — N2581 Secondary hyperparathyroidism of renal origin: Secondary | ICD-10-CM | POA: Diagnosis not present

## 2018-07-02 DIAGNOSIS — D689 Coagulation defect, unspecified: Secondary | ICD-10-CM | POA: Diagnosis not present

## 2018-07-02 DIAGNOSIS — N2581 Secondary hyperparathyroidism of renal origin: Secondary | ICD-10-CM | POA: Diagnosis not present

## 2018-07-02 DIAGNOSIS — N186 End stage renal disease: Secondary | ICD-10-CM | POA: Diagnosis not present

## 2018-07-02 DIAGNOSIS — E1129 Type 2 diabetes mellitus with other diabetic kidney complication: Secondary | ICD-10-CM | POA: Diagnosis not present

## 2018-07-02 DIAGNOSIS — Z4901 Encounter for fitting and adjustment of extracorporeal dialysis catheter: Secondary | ICD-10-CM | POA: Diagnosis not present

## 2018-07-02 DIAGNOSIS — R197 Diarrhea, unspecified: Secondary | ICD-10-CM | POA: Diagnosis not present

## 2018-07-02 DIAGNOSIS — D509 Iron deficiency anemia, unspecified: Secondary | ICD-10-CM | POA: Diagnosis not present

## 2018-07-02 DIAGNOSIS — D631 Anemia in chronic kidney disease: Secondary | ICD-10-CM | POA: Diagnosis not present

## 2018-07-02 DIAGNOSIS — R52 Pain, unspecified: Secondary | ICD-10-CM | POA: Diagnosis not present

## 2018-07-02 DIAGNOSIS — L299 Pruritus, unspecified: Secondary | ICD-10-CM | POA: Diagnosis not present

## 2018-07-04 DIAGNOSIS — Z4901 Encounter for fitting and adjustment of extracorporeal dialysis catheter: Secondary | ICD-10-CM | POA: Diagnosis not present

## 2018-07-04 DIAGNOSIS — D631 Anemia in chronic kidney disease: Secondary | ICD-10-CM | POA: Diagnosis not present

## 2018-07-04 DIAGNOSIS — N186 End stage renal disease: Secondary | ICD-10-CM | POA: Diagnosis not present

## 2018-07-04 DIAGNOSIS — R52 Pain, unspecified: Secondary | ICD-10-CM | POA: Diagnosis not present

## 2018-07-04 DIAGNOSIS — L299 Pruritus, unspecified: Secondary | ICD-10-CM | POA: Diagnosis not present

## 2018-07-04 DIAGNOSIS — D509 Iron deficiency anemia, unspecified: Secondary | ICD-10-CM | POA: Diagnosis not present

## 2018-07-04 DIAGNOSIS — R197 Diarrhea, unspecified: Secondary | ICD-10-CM | POA: Diagnosis not present

## 2018-07-04 DIAGNOSIS — D689 Coagulation defect, unspecified: Secondary | ICD-10-CM | POA: Diagnosis not present

## 2018-07-04 DIAGNOSIS — E1129 Type 2 diabetes mellitus with other diabetic kidney complication: Secondary | ICD-10-CM | POA: Diagnosis not present

## 2018-07-04 DIAGNOSIS — N2581 Secondary hyperparathyroidism of renal origin: Secondary | ICD-10-CM | POA: Diagnosis not present

## 2018-07-07 DIAGNOSIS — R197 Diarrhea, unspecified: Secondary | ICD-10-CM | POA: Diagnosis not present

## 2018-07-07 DIAGNOSIS — N186 End stage renal disease: Secondary | ICD-10-CM | POA: Diagnosis not present

## 2018-07-07 DIAGNOSIS — N2581 Secondary hyperparathyroidism of renal origin: Secondary | ICD-10-CM | POA: Diagnosis not present

## 2018-07-07 DIAGNOSIS — E1129 Type 2 diabetes mellitus with other diabetic kidney complication: Secondary | ICD-10-CM | POA: Diagnosis not present

## 2018-07-07 DIAGNOSIS — D689 Coagulation defect, unspecified: Secondary | ICD-10-CM | POA: Diagnosis not present

## 2018-07-07 DIAGNOSIS — D631 Anemia in chronic kidney disease: Secondary | ICD-10-CM | POA: Diagnosis not present

## 2018-07-07 DIAGNOSIS — L299 Pruritus, unspecified: Secondary | ICD-10-CM | POA: Diagnosis not present

## 2018-07-07 DIAGNOSIS — Z4901 Encounter for fitting and adjustment of extracorporeal dialysis catheter: Secondary | ICD-10-CM | POA: Diagnosis not present

## 2018-07-07 DIAGNOSIS — R52 Pain, unspecified: Secondary | ICD-10-CM | POA: Diagnosis not present

## 2018-07-07 DIAGNOSIS — D509 Iron deficiency anemia, unspecified: Secondary | ICD-10-CM | POA: Diagnosis not present

## 2018-07-09 DIAGNOSIS — D689 Coagulation defect, unspecified: Secondary | ICD-10-CM | POA: Diagnosis not present

## 2018-07-09 DIAGNOSIS — R197 Diarrhea, unspecified: Secondary | ICD-10-CM | POA: Diagnosis not present

## 2018-07-09 DIAGNOSIS — R52 Pain, unspecified: Secondary | ICD-10-CM | POA: Diagnosis not present

## 2018-07-09 DIAGNOSIS — D631 Anemia in chronic kidney disease: Secondary | ICD-10-CM | POA: Diagnosis not present

## 2018-07-09 DIAGNOSIS — L299 Pruritus, unspecified: Secondary | ICD-10-CM | POA: Diagnosis not present

## 2018-07-09 DIAGNOSIS — E1129 Type 2 diabetes mellitus with other diabetic kidney complication: Secondary | ICD-10-CM | POA: Diagnosis not present

## 2018-07-09 DIAGNOSIS — N2581 Secondary hyperparathyroidism of renal origin: Secondary | ICD-10-CM | POA: Diagnosis not present

## 2018-07-09 DIAGNOSIS — N186 End stage renal disease: Secondary | ICD-10-CM | POA: Diagnosis not present

## 2018-07-09 DIAGNOSIS — D509 Iron deficiency anemia, unspecified: Secondary | ICD-10-CM | POA: Diagnosis not present

## 2018-07-09 DIAGNOSIS — Z4901 Encounter for fitting and adjustment of extracorporeal dialysis catheter: Secondary | ICD-10-CM | POA: Diagnosis not present

## 2018-07-11 DIAGNOSIS — E1129 Type 2 diabetes mellitus with other diabetic kidney complication: Secondary | ICD-10-CM | POA: Diagnosis not present

## 2018-07-11 DIAGNOSIS — Z4901 Encounter for fitting and adjustment of extracorporeal dialysis catheter: Secondary | ICD-10-CM | POA: Diagnosis not present

## 2018-07-11 DIAGNOSIS — L299 Pruritus, unspecified: Secondary | ICD-10-CM | POA: Diagnosis not present

## 2018-07-11 DIAGNOSIS — R52 Pain, unspecified: Secondary | ICD-10-CM | POA: Diagnosis not present

## 2018-07-11 DIAGNOSIS — N2581 Secondary hyperparathyroidism of renal origin: Secondary | ICD-10-CM | POA: Diagnosis not present

## 2018-07-11 DIAGNOSIS — D689 Coagulation defect, unspecified: Secondary | ICD-10-CM | POA: Diagnosis not present

## 2018-07-11 DIAGNOSIS — R197 Diarrhea, unspecified: Secondary | ICD-10-CM | POA: Diagnosis not present

## 2018-07-11 DIAGNOSIS — N186 End stage renal disease: Secondary | ICD-10-CM | POA: Diagnosis not present

## 2018-07-11 DIAGNOSIS — D631 Anemia in chronic kidney disease: Secondary | ICD-10-CM | POA: Diagnosis not present

## 2018-07-11 DIAGNOSIS — D509 Iron deficiency anemia, unspecified: Secondary | ICD-10-CM | POA: Diagnosis not present

## 2018-07-14 DIAGNOSIS — E1129 Type 2 diabetes mellitus with other diabetic kidney complication: Secondary | ICD-10-CM | POA: Diagnosis not present

## 2018-07-14 DIAGNOSIS — R197 Diarrhea, unspecified: Secondary | ICD-10-CM | POA: Diagnosis not present

## 2018-07-14 DIAGNOSIS — R52 Pain, unspecified: Secondary | ICD-10-CM | POA: Diagnosis not present

## 2018-07-14 DIAGNOSIS — L299 Pruritus, unspecified: Secondary | ICD-10-CM | POA: Diagnosis not present

## 2018-07-14 DIAGNOSIS — Z4901 Encounter for fitting and adjustment of extracorporeal dialysis catheter: Secondary | ICD-10-CM | POA: Diagnosis not present

## 2018-07-14 DIAGNOSIS — D689 Coagulation defect, unspecified: Secondary | ICD-10-CM | POA: Diagnosis not present

## 2018-07-14 DIAGNOSIS — N186 End stage renal disease: Secondary | ICD-10-CM | POA: Diagnosis not present

## 2018-07-14 DIAGNOSIS — D631 Anemia in chronic kidney disease: Secondary | ICD-10-CM | POA: Diagnosis not present

## 2018-07-14 DIAGNOSIS — N2581 Secondary hyperparathyroidism of renal origin: Secondary | ICD-10-CM | POA: Diagnosis not present

## 2018-07-14 DIAGNOSIS — D509 Iron deficiency anemia, unspecified: Secondary | ICD-10-CM | POA: Diagnosis not present

## 2018-07-16 DIAGNOSIS — R197 Diarrhea, unspecified: Secondary | ICD-10-CM | POA: Diagnosis not present

## 2018-07-16 DIAGNOSIS — D689 Coagulation defect, unspecified: Secondary | ICD-10-CM | POA: Diagnosis not present

## 2018-07-16 DIAGNOSIS — Z4901 Encounter for fitting and adjustment of extracorporeal dialysis catheter: Secondary | ICD-10-CM | POA: Diagnosis not present

## 2018-07-16 DIAGNOSIS — D631 Anemia in chronic kidney disease: Secondary | ICD-10-CM | POA: Diagnosis not present

## 2018-07-16 DIAGNOSIS — D509 Iron deficiency anemia, unspecified: Secondary | ICD-10-CM | POA: Diagnosis not present

## 2018-07-16 DIAGNOSIS — L299 Pruritus, unspecified: Secondary | ICD-10-CM | POA: Diagnosis not present

## 2018-07-16 DIAGNOSIS — N2581 Secondary hyperparathyroidism of renal origin: Secondary | ICD-10-CM | POA: Diagnosis not present

## 2018-07-16 DIAGNOSIS — N186 End stage renal disease: Secondary | ICD-10-CM | POA: Diagnosis not present

## 2018-07-16 DIAGNOSIS — R52 Pain, unspecified: Secondary | ICD-10-CM | POA: Diagnosis not present

## 2018-07-16 DIAGNOSIS — E1129 Type 2 diabetes mellitus with other diabetic kidney complication: Secondary | ICD-10-CM | POA: Diagnosis not present

## 2018-07-18 DIAGNOSIS — Z4901 Encounter for fitting and adjustment of extracorporeal dialysis catheter: Secondary | ICD-10-CM | POA: Diagnosis not present

## 2018-07-18 DIAGNOSIS — D631 Anemia in chronic kidney disease: Secondary | ICD-10-CM | POA: Diagnosis not present

## 2018-07-18 DIAGNOSIS — D509 Iron deficiency anemia, unspecified: Secondary | ICD-10-CM | POA: Diagnosis not present

## 2018-07-18 DIAGNOSIS — N2581 Secondary hyperparathyroidism of renal origin: Secondary | ICD-10-CM | POA: Diagnosis not present

## 2018-07-18 DIAGNOSIS — R197 Diarrhea, unspecified: Secondary | ICD-10-CM | POA: Diagnosis not present

## 2018-07-18 DIAGNOSIS — L299 Pruritus, unspecified: Secondary | ICD-10-CM | POA: Diagnosis not present

## 2018-07-18 DIAGNOSIS — R52 Pain, unspecified: Secondary | ICD-10-CM | POA: Diagnosis not present

## 2018-07-18 DIAGNOSIS — N186 End stage renal disease: Secondary | ICD-10-CM | POA: Diagnosis not present

## 2018-07-18 DIAGNOSIS — D689 Coagulation defect, unspecified: Secondary | ICD-10-CM | POA: Diagnosis not present

## 2018-07-18 DIAGNOSIS — E1129 Type 2 diabetes mellitus with other diabetic kidney complication: Secondary | ICD-10-CM | POA: Diagnosis not present

## 2018-07-21 DIAGNOSIS — N186 End stage renal disease: Secondary | ICD-10-CM | POA: Diagnosis not present

## 2018-07-21 DIAGNOSIS — R197 Diarrhea, unspecified: Secondary | ICD-10-CM | POA: Diagnosis not present

## 2018-07-21 DIAGNOSIS — D631 Anemia in chronic kidney disease: Secondary | ICD-10-CM | POA: Diagnosis not present

## 2018-07-21 DIAGNOSIS — L299 Pruritus, unspecified: Secondary | ICD-10-CM | POA: Diagnosis not present

## 2018-07-21 DIAGNOSIS — Z4901 Encounter for fitting and adjustment of extracorporeal dialysis catheter: Secondary | ICD-10-CM | POA: Diagnosis not present

## 2018-07-21 DIAGNOSIS — D509 Iron deficiency anemia, unspecified: Secondary | ICD-10-CM | POA: Diagnosis not present

## 2018-07-21 DIAGNOSIS — R52 Pain, unspecified: Secondary | ICD-10-CM | POA: Diagnosis not present

## 2018-07-21 DIAGNOSIS — E1129 Type 2 diabetes mellitus with other diabetic kidney complication: Secondary | ICD-10-CM | POA: Diagnosis not present

## 2018-07-21 DIAGNOSIS — N2581 Secondary hyperparathyroidism of renal origin: Secondary | ICD-10-CM | POA: Diagnosis not present

## 2018-07-21 DIAGNOSIS — D689 Coagulation defect, unspecified: Secondary | ICD-10-CM | POA: Diagnosis not present

## 2018-07-23 DIAGNOSIS — D631 Anemia in chronic kidney disease: Secondary | ICD-10-CM | POA: Diagnosis not present

## 2018-07-23 DIAGNOSIS — E1129 Type 2 diabetes mellitus with other diabetic kidney complication: Secondary | ICD-10-CM | POA: Diagnosis not present

## 2018-07-23 DIAGNOSIS — L299 Pruritus, unspecified: Secondary | ICD-10-CM | POA: Diagnosis not present

## 2018-07-23 DIAGNOSIS — R52 Pain, unspecified: Secondary | ICD-10-CM | POA: Diagnosis not present

## 2018-07-23 DIAGNOSIS — D689 Coagulation defect, unspecified: Secondary | ICD-10-CM | POA: Diagnosis not present

## 2018-07-23 DIAGNOSIS — D509 Iron deficiency anemia, unspecified: Secondary | ICD-10-CM | POA: Diagnosis not present

## 2018-07-23 DIAGNOSIS — Z992 Dependence on renal dialysis: Secondary | ICD-10-CM | POA: Diagnosis not present

## 2018-07-23 DIAGNOSIS — E1122 Type 2 diabetes mellitus with diabetic chronic kidney disease: Secondary | ICD-10-CM | POA: Diagnosis not present

## 2018-07-23 DIAGNOSIS — R197 Diarrhea, unspecified: Secondary | ICD-10-CM | POA: Diagnosis not present

## 2018-07-23 DIAGNOSIS — N186 End stage renal disease: Secondary | ICD-10-CM | POA: Diagnosis not present

## 2018-07-23 DIAGNOSIS — N2581 Secondary hyperparathyroidism of renal origin: Secondary | ICD-10-CM | POA: Diagnosis not present

## 2018-07-23 DIAGNOSIS — Z4901 Encounter for fitting and adjustment of extracorporeal dialysis catheter: Secondary | ICD-10-CM | POA: Diagnosis not present

## 2018-07-25 DIAGNOSIS — E1129 Type 2 diabetes mellitus with other diabetic kidney complication: Secondary | ICD-10-CM | POA: Diagnosis not present

## 2018-07-25 DIAGNOSIS — D689 Coagulation defect, unspecified: Secondary | ICD-10-CM | POA: Diagnosis not present

## 2018-07-25 DIAGNOSIS — N2581 Secondary hyperparathyroidism of renal origin: Secondary | ICD-10-CM | POA: Diagnosis not present

## 2018-07-25 DIAGNOSIS — L299 Pruritus, unspecified: Secondary | ICD-10-CM | POA: Diagnosis not present

## 2018-07-25 DIAGNOSIS — D509 Iron deficiency anemia, unspecified: Secondary | ICD-10-CM | POA: Diagnosis not present

## 2018-07-25 DIAGNOSIS — Z4901 Encounter for fitting and adjustment of extracorporeal dialysis catheter: Secondary | ICD-10-CM | POA: Diagnosis not present

## 2018-07-25 DIAGNOSIS — R52 Pain, unspecified: Secondary | ICD-10-CM | POA: Diagnosis not present

## 2018-07-25 DIAGNOSIS — D631 Anemia in chronic kidney disease: Secondary | ICD-10-CM | POA: Diagnosis not present

## 2018-07-25 DIAGNOSIS — N186 End stage renal disease: Secondary | ICD-10-CM | POA: Diagnosis not present

## 2018-07-28 DIAGNOSIS — R52 Pain, unspecified: Secondary | ICD-10-CM | POA: Diagnosis not present

## 2018-07-28 DIAGNOSIS — N186 End stage renal disease: Secondary | ICD-10-CM | POA: Diagnosis not present

## 2018-07-28 DIAGNOSIS — Z4901 Encounter for fitting and adjustment of extracorporeal dialysis catheter: Secondary | ICD-10-CM | POA: Diagnosis not present

## 2018-07-28 DIAGNOSIS — E1129 Type 2 diabetes mellitus with other diabetic kidney complication: Secondary | ICD-10-CM | POA: Diagnosis not present

## 2018-07-28 DIAGNOSIS — D689 Coagulation defect, unspecified: Secondary | ICD-10-CM | POA: Diagnosis not present

## 2018-07-28 DIAGNOSIS — N2581 Secondary hyperparathyroidism of renal origin: Secondary | ICD-10-CM | POA: Diagnosis not present

## 2018-07-28 DIAGNOSIS — D631 Anemia in chronic kidney disease: Secondary | ICD-10-CM | POA: Diagnosis not present

## 2018-07-28 DIAGNOSIS — L299 Pruritus, unspecified: Secondary | ICD-10-CM | POA: Diagnosis not present

## 2018-07-28 DIAGNOSIS — D509 Iron deficiency anemia, unspecified: Secondary | ICD-10-CM | POA: Diagnosis not present

## 2018-07-30 DIAGNOSIS — L299 Pruritus, unspecified: Secondary | ICD-10-CM | POA: Diagnosis not present

## 2018-07-30 DIAGNOSIS — E1129 Type 2 diabetes mellitus with other diabetic kidney complication: Secondary | ICD-10-CM | POA: Diagnosis not present

## 2018-07-30 DIAGNOSIS — Z4901 Encounter for fitting and adjustment of extracorporeal dialysis catheter: Secondary | ICD-10-CM | POA: Diagnosis not present

## 2018-07-30 DIAGNOSIS — D631 Anemia in chronic kidney disease: Secondary | ICD-10-CM | POA: Diagnosis not present

## 2018-07-30 DIAGNOSIS — D689 Coagulation defect, unspecified: Secondary | ICD-10-CM | POA: Diagnosis not present

## 2018-07-30 DIAGNOSIS — N186 End stage renal disease: Secondary | ICD-10-CM | POA: Diagnosis not present

## 2018-07-30 DIAGNOSIS — R52 Pain, unspecified: Secondary | ICD-10-CM | POA: Diagnosis not present

## 2018-07-30 DIAGNOSIS — D509 Iron deficiency anemia, unspecified: Secondary | ICD-10-CM | POA: Diagnosis not present

## 2018-07-30 DIAGNOSIS — N2581 Secondary hyperparathyroidism of renal origin: Secondary | ICD-10-CM | POA: Diagnosis not present

## 2018-08-01 DIAGNOSIS — D509 Iron deficiency anemia, unspecified: Secondary | ICD-10-CM | POA: Diagnosis not present

## 2018-08-01 DIAGNOSIS — D631 Anemia in chronic kidney disease: Secondary | ICD-10-CM | POA: Diagnosis not present

## 2018-08-01 DIAGNOSIS — Z4901 Encounter for fitting and adjustment of extracorporeal dialysis catheter: Secondary | ICD-10-CM | POA: Diagnosis not present

## 2018-08-01 DIAGNOSIS — N186 End stage renal disease: Secondary | ICD-10-CM | POA: Diagnosis not present

## 2018-08-01 DIAGNOSIS — D689 Coagulation defect, unspecified: Secondary | ICD-10-CM | POA: Diagnosis not present

## 2018-08-01 DIAGNOSIS — L299 Pruritus, unspecified: Secondary | ICD-10-CM | POA: Diagnosis not present

## 2018-08-01 DIAGNOSIS — R52 Pain, unspecified: Secondary | ICD-10-CM | POA: Diagnosis not present

## 2018-08-01 DIAGNOSIS — E1129 Type 2 diabetes mellitus with other diabetic kidney complication: Secondary | ICD-10-CM | POA: Diagnosis not present

## 2018-08-01 DIAGNOSIS — N2581 Secondary hyperparathyroidism of renal origin: Secondary | ICD-10-CM | POA: Diagnosis not present

## 2018-08-04 DIAGNOSIS — R52 Pain, unspecified: Secondary | ICD-10-CM | POA: Diagnosis not present

## 2018-08-04 DIAGNOSIS — L299 Pruritus, unspecified: Secondary | ICD-10-CM | POA: Diagnosis not present

## 2018-08-04 DIAGNOSIS — E1129 Type 2 diabetes mellitus with other diabetic kidney complication: Secondary | ICD-10-CM | POA: Diagnosis not present

## 2018-08-04 DIAGNOSIS — Z4901 Encounter for fitting and adjustment of extracorporeal dialysis catheter: Secondary | ICD-10-CM | POA: Diagnosis not present

## 2018-08-04 DIAGNOSIS — N2581 Secondary hyperparathyroidism of renal origin: Secondary | ICD-10-CM | POA: Diagnosis not present

## 2018-08-04 DIAGNOSIS — D509 Iron deficiency anemia, unspecified: Secondary | ICD-10-CM | POA: Diagnosis not present

## 2018-08-04 DIAGNOSIS — N186 End stage renal disease: Secondary | ICD-10-CM | POA: Diagnosis not present

## 2018-08-04 DIAGNOSIS — D631 Anemia in chronic kidney disease: Secondary | ICD-10-CM | POA: Diagnosis not present

## 2018-08-04 DIAGNOSIS — D689 Coagulation defect, unspecified: Secondary | ICD-10-CM | POA: Diagnosis not present

## 2018-08-06 DIAGNOSIS — D631 Anemia in chronic kidney disease: Secondary | ICD-10-CM | POA: Diagnosis not present

## 2018-08-06 DIAGNOSIS — D509 Iron deficiency anemia, unspecified: Secondary | ICD-10-CM | POA: Diagnosis not present

## 2018-08-06 DIAGNOSIS — L299 Pruritus, unspecified: Secondary | ICD-10-CM | POA: Diagnosis not present

## 2018-08-06 DIAGNOSIS — D689 Coagulation defect, unspecified: Secondary | ICD-10-CM | POA: Diagnosis not present

## 2018-08-06 DIAGNOSIS — N2581 Secondary hyperparathyroidism of renal origin: Secondary | ICD-10-CM | POA: Diagnosis not present

## 2018-08-06 DIAGNOSIS — Z4901 Encounter for fitting and adjustment of extracorporeal dialysis catheter: Secondary | ICD-10-CM | POA: Diagnosis not present

## 2018-08-06 DIAGNOSIS — R52 Pain, unspecified: Secondary | ICD-10-CM | POA: Diagnosis not present

## 2018-08-06 DIAGNOSIS — E1129 Type 2 diabetes mellitus with other diabetic kidney complication: Secondary | ICD-10-CM | POA: Diagnosis not present

## 2018-08-06 DIAGNOSIS — N186 End stage renal disease: Secondary | ICD-10-CM | POA: Diagnosis not present

## 2018-08-08 DIAGNOSIS — Z4901 Encounter for fitting and adjustment of extracorporeal dialysis catheter: Secondary | ICD-10-CM | POA: Diagnosis not present

## 2018-08-08 DIAGNOSIS — D689 Coagulation defect, unspecified: Secondary | ICD-10-CM | POA: Diagnosis not present

## 2018-08-08 DIAGNOSIS — D631 Anemia in chronic kidney disease: Secondary | ICD-10-CM | POA: Diagnosis not present

## 2018-08-08 DIAGNOSIS — D509 Iron deficiency anemia, unspecified: Secondary | ICD-10-CM | POA: Diagnosis not present

## 2018-08-08 DIAGNOSIS — E1129 Type 2 diabetes mellitus with other diabetic kidney complication: Secondary | ICD-10-CM | POA: Diagnosis not present

## 2018-08-08 DIAGNOSIS — N186 End stage renal disease: Secondary | ICD-10-CM | POA: Diagnosis not present

## 2018-08-08 DIAGNOSIS — L299 Pruritus, unspecified: Secondary | ICD-10-CM | POA: Diagnosis not present

## 2018-08-08 DIAGNOSIS — R52 Pain, unspecified: Secondary | ICD-10-CM | POA: Diagnosis not present

## 2018-08-08 DIAGNOSIS — N2581 Secondary hyperparathyroidism of renal origin: Secondary | ICD-10-CM | POA: Diagnosis not present

## 2018-08-11 DIAGNOSIS — D689 Coagulation defect, unspecified: Secondary | ICD-10-CM | POA: Diagnosis not present

## 2018-08-11 DIAGNOSIS — Z4901 Encounter for fitting and adjustment of extracorporeal dialysis catheter: Secondary | ICD-10-CM | POA: Diagnosis not present

## 2018-08-11 DIAGNOSIS — E1129 Type 2 diabetes mellitus with other diabetic kidney complication: Secondary | ICD-10-CM | POA: Diagnosis not present

## 2018-08-11 DIAGNOSIS — N186 End stage renal disease: Secondary | ICD-10-CM | POA: Diagnosis not present

## 2018-08-11 DIAGNOSIS — L299 Pruritus, unspecified: Secondary | ICD-10-CM | POA: Diagnosis not present

## 2018-08-11 DIAGNOSIS — D631 Anemia in chronic kidney disease: Secondary | ICD-10-CM | POA: Diagnosis not present

## 2018-08-11 DIAGNOSIS — R52 Pain, unspecified: Secondary | ICD-10-CM | POA: Diagnosis not present

## 2018-08-11 DIAGNOSIS — N2581 Secondary hyperparathyroidism of renal origin: Secondary | ICD-10-CM | POA: Diagnosis not present

## 2018-08-11 DIAGNOSIS — D509 Iron deficiency anemia, unspecified: Secondary | ICD-10-CM | POA: Diagnosis not present

## 2018-08-13 DIAGNOSIS — E1129 Type 2 diabetes mellitus with other diabetic kidney complication: Secondary | ICD-10-CM | POA: Diagnosis not present

## 2018-08-13 DIAGNOSIS — N2581 Secondary hyperparathyroidism of renal origin: Secondary | ICD-10-CM | POA: Diagnosis not present

## 2018-08-13 DIAGNOSIS — Z4901 Encounter for fitting and adjustment of extracorporeal dialysis catheter: Secondary | ICD-10-CM | POA: Diagnosis not present

## 2018-08-13 DIAGNOSIS — R52 Pain, unspecified: Secondary | ICD-10-CM | POA: Diagnosis not present

## 2018-08-13 DIAGNOSIS — L299 Pruritus, unspecified: Secondary | ICD-10-CM | POA: Diagnosis not present

## 2018-08-13 DIAGNOSIS — D689 Coagulation defect, unspecified: Secondary | ICD-10-CM | POA: Diagnosis not present

## 2018-08-13 DIAGNOSIS — D509 Iron deficiency anemia, unspecified: Secondary | ICD-10-CM | POA: Diagnosis not present

## 2018-08-13 DIAGNOSIS — N186 End stage renal disease: Secondary | ICD-10-CM | POA: Diagnosis not present

## 2018-08-13 DIAGNOSIS — D631 Anemia in chronic kidney disease: Secondary | ICD-10-CM | POA: Diagnosis not present

## 2018-08-15 DIAGNOSIS — L299 Pruritus, unspecified: Secondary | ICD-10-CM | POA: Diagnosis not present

## 2018-08-15 DIAGNOSIS — E1129 Type 2 diabetes mellitus with other diabetic kidney complication: Secondary | ICD-10-CM | POA: Diagnosis not present

## 2018-08-15 DIAGNOSIS — Z4901 Encounter for fitting and adjustment of extracorporeal dialysis catheter: Secondary | ICD-10-CM | POA: Diagnosis not present

## 2018-08-15 DIAGNOSIS — D631 Anemia in chronic kidney disease: Secondary | ICD-10-CM | POA: Diagnosis not present

## 2018-08-15 DIAGNOSIS — D509 Iron deficiency anemia, unspecified: Secondary | ICD-10-CM | POA: Diagnosis not present

## 2018-08-15 DIAGNOSIS — N186 End stage renal disease: Secondary | ICD-10-CM | POA: Diagnosis not present

## 2018-08-15 DIAGNOSIS — D689 Coagulation defect, unspecified: Secondary | ICD-10-CM | POA: Diagnosis not present

## 2018-08-15 DIAGNOSIS — R52 Pain, unspecified: Secondary | ICD-10-CM | POA: Diagnosis not present

## 2018-08-15 DIAGNOSIS — N2581 Secondary hyperparathyroidism of renal origin: Secondary | ICD-10-CM | POA: Diagnosis not present

## 2018-08-18 DIAGNOSIS — D689 Coagulation defect, unspecified: Secondary | ICD-10-CM | POA: Diagnosis not present

## 2018-08-18 DIAGNOSIS — N2581 Secondary hyperparathyroidism of renal origin: Secondary | ICD-10-CM | POA: Diagnosis not present

## 2018-08-18 DIAGNOSIS — E1129 Type 2 diabetes mellitus with other diabetic kidney complication: Secondary | ICD-10-CM | POA: Diagnosis not present

## 2018-08-18 DIAGNOSIS — D631 Anemia in chronic kidney disease: Secondary | ICD-10-CM | POA: Diagnosis not present

## 2018-08-18 DIAGNOSIS — D509 Iron deficiency anemia, unspecified: Secondary | ICD-10-CM | POA: Diagnosis not present

## 2018-08-18 DIAGNOSIS — N186 End stage renal disease: Secondary | ICD-10-CM | POA: Diagnosis not present

## 2018-08-18 DIAGNOSIS — L299 Pruritus, unspecified: Secondary | ICD-10-CM | POA: Diagnosis not present

## 2018-08-18 DIAGNOSIS — Z4901 Encounter for fitting and adjustment of extracorporeal dialysis catheter: Secondary | ICD-10-CM | POA: Diagnosis not present

## 2018-08-18 DIAGNOSIS — R52 Pain, unspecified: Secondary | ICD-10-CM | POA: Diagnosis not present

## 2018-08-20 DIAGNOSIS — N186 End stage renal disease: Secondary | ICD-10-CM | POA: Diagnosis not present

## 2018-08-20 DIAGNOSIS — D689 Coagulation defect, unspecified: Secondary | ICD-10-CM | POA: Diagnosis not present

## 2018-08-20 DIAGNOSIS — R52 Pain, unspecified: Secondary | ICD-10-CM | POA: Diagnosis not present

## 2018-08-20 DIAGNOSIS — Z4901 Encounter for fitting and adjustment of extracorporeal dialysis catheter: Secondary | ICD-10-CM | POA: Diagnosis not present

## 2018-08-20 DIAGNOSIS — D509 Iron deficiency anemia, unspecified: Secondary | ICD-10-CM | POA: Diagnosis not present

## 2018-08-20 DIAGNOSIS — N2581 Secondary hyperparathyroidism of renal origin: Secondary | ICD-10-CM | POA: Diagnosis not present

## 2018-08-20 DIAGNOSIS — L299 Pruritus, unspecified: Secondary | ICD-10-CM | POA: Diagnosis not present

## 2018-08-20 DIAGNOSIS — E1129 Type 2 diabetes mellitus with other diabetic kidney complication: Secondary | ICD-10-CM | POA: Diagnosis not present

## 2018-08-20 DIAGNOSIS — D631 Anemia in chronic kidney disease: Secondary | ICD-10-CM | POA: Diagnosis not present

## 2018-08-22 DIAGNOSIS — D509 Iron deficiency anemia, unspecified: Secondary | ICD-10-CM | POA: Diagnosis not present

## 2018-08-22 DIAGNOSIS — Z4901 Encounter for fitting and adjustment of extracorporeal dialysis catheter: Secondary | ICD-10-CM | POA: Diagnosis not present

## 2018-08-22 DIAGNOSIS — E1129 Type 2 diabetes mellitus with other diabetic kidney complication: Secondary | ICD-10-CM | POA: Diagnosis not present

## 2018-08-22 DIAGNOSIS — D689 Coagulation defect, unspecified: Secondary | ICD-10-CM | POA: Diagnosis not present

## 2018-08-22 DIAGNOSIS — N186 End stage renal disease: Secondary | ICD-10-CM | POA: Diagnosis not present

## 2018-08-22 DIAGNOSIS — R52 Pain, unspecified: Secondary | ICD-10-CM | POA: Diagnosis not present

## 2018-08-22 DIAGNOSIS — N2581 Secondary hyperparathyroidism of renal origin: Secondary | ICD-10-CM | POA: Diagnosis not present

## 2018-08-22 DIAGNOSIS — L299 Pruritus, unspecified: Secondary | ICD-10-CM | POA: Diagnosis not present

## 2018-08-22 DIAGNOSIS — D631 Anemia in chronic kidney disease: Secondary | ICD-10-CM | POA: Diagnosis not present

## 2018-08-23 DIAGNOSIS — Z992 Dependence on renal dialysis: Secondary | ICD-10-CM | POA: Diagnosis not present

## 2018-08-23 DIAGNOSIS — N186 End stage renal disease: Secondary | ICD-10-CM | POA: Diagnosis not present

## 2018-08-23 DIAGNOSIS — E1122 Type 2 diabetes mellitus with diabetic chronic kidney disease: Secondary | ICD-10-CM | POA: Diagnosis not present

## 2018-08-25 DIAGNOSIS — N186 End stage renal disease: Secondary | ICD-10-CM | POA: Diagnosis not present

## 2018-08-25 DIAGNOSIS — D631 Anemia in chronic kidney disease: Secondary | ICD-10-CM | POA: Diagnosis not present

## 2018-08-25 DIAGNOSIS — Z4901 Encounter for fitting and adjustment of extracorporeal dialysis catheter: Secondary | ICD-10-CM | POA: Diagnosis not present

## 2018-08-25 DIAGNOSIS — D509 Iron deficiency anemia, unspecified: Secondary | ICD-10-CM | POA: Diagnosis not present

## 2018-08-25 DIAGNOSIS — D689 Coagulation defect, unspecified: Secondary | ICD-10-CM | POA: Diagnosis not present

## 2018-08-25 DIAGNOSIS — R52 Pain, unspecified: Secondary | ICD-10-CM | POA: Diagnosis not present

## 2018-08-25 DIAGNOSIS — N2581 Secondary hyperparathyroidism of renal origin: Secondary | ICD-10-CM | POA: Diagnosis not present

## 2018-08-25 DIAGNOSIS — E1129 Type 2 diabetes mellitus with other diabetic kidney complication: Secondary | ICD-10-CM | POA: Diagnosis not present

## 2018-08-25 DIAGNOSIS — L299 Pruritus, unspecified: Secondary | ICD-10-CM | POA: Diagnosis not present

## 2018-08-27 DIAGNOSIS — N2581 Secondary hyperparathyroidism of renal origin: Secondary | ICD-10-CM | POA: Diagnosis not present

## 2018-08-27 DIAGNOSIS — D689 Coagulation defect, unspecified: Secondary | ICD-10-CM | POA: Diagnosis not present

## 2018-08-27 DIAGNOSIS — N186 End stage renal disease: Secondary | ICD-10-CM | POA: Diagnosis not present

## 2018-08-27 DIAGNOSIS — Z4901 Encounter for fitting and adjustment of extracorporeal dialysis catheter: Secondary | ICD-10-CM | POA: Diagnosis not present

## 2018-08-27 DIAGNOSIS — R52 Pain, unspecified: Secondary | ICD-10-CM | POA: Diagnosis not present

## 2018-08-27 DIAGNOSIS — D509 Iron deficiency anemia, unspecified: Secondary | ICD-10-CM | POA: Diagnosis not present

## 2018-08-27 DIAGNOSIS — D631 Anemia in chronic kidney disease: Secondary | ICD-10-CM | POA: Diagnosis not present

## 2018-08-27 DIAGNOSIS — L299 Pruritus, unspecified: Secondary | ICD-10-CM | POA: Diagnosis not present

## 2018-08-27 DIAGNOSIS — E1129 Type 2 diabetes mellitus with other diabetic kidney complication: Secondary | ICD-10-CM | POA: Diagnosis not present

## 2018-08-29 DIAGNOSIS — N2581 Secondary hyperparathyroidism of renal origin: Secondary | ICD-10-CM | POA: Diagnosis not present

## 2018-08-29 DIAGNOSIS — R52 Pain, unspecified: Secondary | ICD-10-CM | POA: Diagnosis not present

## 2018-08-29 DIAGNOSIS — L299 Pruritus, unspecified: Secondary | ICD-10-CM | POA: Diagnosis not present

## 2018-08-29 DIAGNOSIS — D689 Coagulation defect, unspecified: Secondary | ICD-10-CM | POA: Diagnosis not present

## 2018-08-29 DIAGNOSIS — E1129 Type 2 diabetes mellitus with other diabetic kidney complication: Secondary | ICD-10-CM | POA: Diagnosis not present

## 2018-08-29 DIAGNOSIS — D509 Iron deficiency anemia, unspecified: Secondary | ICD-10-CM | POA: Diagnosis not present

## 2018-08-29 DIAGNOSIS — D631 Anemia in chronic kidney disease: Secondary | ICD-10-CM | POA: Diagnosis not present

## 2018-08-29 DIAGNOSIS — N186 End stage renal disease: Secondary | ICD-10-CM | POA: Diagnosis not present

## 2018-08-29 DIAGNOSIS — Z4901 Encounter for fitting and adjustment of extracorporeal dialysis catheter: Secondary | ICD-10-CM | POA: Diagnosis not present

## 2018-08-31 DIAGNOSIS — E119 Type 2 diabetes mellitus without complications: Secondary | ICD-10-CM | POA: Diagnosis not present

## 2018-09-01 DIAGNOSIS — N186 End stage renal disease: Secondary | ICD-10-CM | POA: Diagnosis not present

## 2018-09-01 DIAGNOSIS — D509 Iron deficiency anemia, unspecified: Secondary | ICD-10-CM | POA: Diagnosis not present

## 2018-09-01 DIAGNOSIS — R52 Pain, unspecified: Secondary | ICD-10-CM | POA: Diagnosis not present

## 2018-09-01 DIAGNOSIS — L299 Pruritus, unspecified: Secondary | ICD-10-CM | POA: Diagnosis not present

## 2018-09-01 DIAGNOSIS — Z4901 Encounter for fitting and adjustment of extracorporeal dialysis catheter: Secondary | ICD-10-CM | POA: Diagnosis not present

## 2018-09-01 DIAGNOSIS — D689 Coagulation defect, unspecified: Secondary | ICD-10-CM | POA: Diagnosis not present

## 2018-09-01 DIAGNOSIS — E1129 Type 2 diabetes mellitus with other diabetic kidney complication: Secondary | ICD-10-CM | POA: Diagnosis not present

## 2018-09-01 DIAGNOSIS — N2581 Secondary hyperparathyroidism of renal origin: Secondary | ICD-10-CM | POA: Diagnosis not present

## 2018-09-01 DIAGNOSIS — D631 Anemia in chronic kidney disease: Secondary | ICD-10-CM | POA: Diagnosis not present

## 2018-09-03 DIAGNOSIS — N2581 Secondary hyperparathyroidism of renal origin: Secondary | ICD-10-CM | POA: Diagnosis not present

## 2018-09-03 DIAGNOSIS — R52 Pain, unspecified: Secondary | ICD-10-CM | POA: Diagnosis not present

## 2018-09-03 DIAGNOSIS — E1129 Type 2 diabetes mellitus with other diabetic kidney complication: Secondary | ICD-10-CM | POA: Diagnosis not present

## 2018-09-03 DIAGNOSIS — D689 Coagulation defect, unspecified: Secondary | ICD-10-CM | POA: Diagnosis not present

## 2018-09-03 DIAGNOSIS — D631 Anemia in chronic kidney disease: Secondary | ICD-10-CM | POA: Diagnosis not present

## 2018-09-03 DIAGNOSIS — N186 End stage renal disease: Secondary | ICD-10-CM | POA: Diagnosis not present

## 2018-09-03 DIAGNOSIS — L299 Pruritus, unspecified: Secondary | ICD-10-CM | POA: Diagnosis not present

## 2018-09-03 DIAGNOSIS — Z4901 Encounter for fitting and adjustment of extracorporeal dialysis catheter: Secondary | ICD-10-CM | POA: Diagnosis not present

## 2018-09-03 DIAGNOSIS — D509 Iron deficiency anemia, unspecified: Secondary | ICD-10-CM | POA: Diagnosis not present

## 2018-09-03 DIAGNOSIS — E119 Type 2 diabetes mellitus without complications: Secondary | ICD-10-CM | POA: Diagnosis not present

## 2018-09-05 DIAGNOSIS — D689 Coagulation defect, unspecified: Secondary | ICD-10-CM | POA: Diagnosis not present

## 2018-09-05 DIAGNOSIS — D509 Iron deficiency anemia, unspecified: Secondary | ICD-10-CM | POA: Diagnosis not present

## 2018-09-05 DIAGNOSIS — N186 End stage renal disease: Secondary | ICD-10-CM | POA: Diagnosis not present

## 2018-09-05 DIAGNOSIS — D631 Anemia in chronic kidney disease: Secondary | ICD-10-CM | POA: Diagnosis not present

## 2018-09-05 DIAGNOSIS — L299 Pruritus, unspecified: Secondary | ICD-10-CM | POA: Diagnosis not present

## 2018-09-05 DIAGNOSIS — E1129 Type 2 diabetes mellitus with other diabetic kidney complication: Secondary | ICD-10-CM | POA: Diagnosis not present

## 2018-09-05 DIAGNOSIS — R52 Pain, unspecified: Secondary | ICD-10-CM | POA: Diagnosis not present

## 2018-09-05 DIAGNOSIS — Z4901 Encounter for fitting and adjustment of extracorporeal dialysis catheter: Secondary | ICD-10-CM | POA: Diagnosis not present

## 2018-09-05 DIAGNOSIS — N2581 Secondary hyperparathyroidism of renal origin: Secondary | ICD-10-CM | POA: Diagnosis not present

## 2018-09-08 DIAGNOSIS — D689 Coagulation defect, unspecified: Secondary | ICD-10-CM | POA: Diagnosis not present

## 2018-09-08 DIAGNOSIS — N186 End stage renal disease: Secondary | ICD-10-CM | POA: Diagnosis not present

## 2018-09-08 DIAGNOSIS — L299 Pruritus, unspecified: Secondary | ICD-10-CM | POA: Diagnosis not present

## 2018-09-08 DIAGNOSIS — R52 Pain, unspecified: Secondary | ICD-10-CM | POA: Diagnosis not present

## 2018-09-08 DIAGNOSIS — D631 Anemia in chronic kidney disease: Secondary | ICD-10-CM | POA: Diagnosis not present

## 2018-09-08 DIAGNOSIS — D509 Iron deficiency anemia, unspecified: Secondary | ICD-10-CM | POA: Diagnosis not present

## 2018-09-08 DIAGNOSIS — N2581 Secondary hyperparathyroidism of renal origin: Secondary | ICD-10-CM | POA: Diagnosis not present

## 2018-09-08 DIAGNOSIS — E1129 Type 2 diabetes mellitus with other diabetic kidney complication: Secondary | ICD-10-CM | POA: Diagnosis not present

## 2018-09-08 DIAGNOSIS — Z4901 Encounter for fitting and adjustment of extracorporeal dialysis catheter: Secondary | ICD-10-CM | POA: Diagnosis not present

## 2018-09-10 DIAGNOSIS — E1129 Type 2 diabetes mellitus with other diabetic kidney complication: Secondary | ICD-10-CM | POA: Diagnosis not present

## 2018-09-10 DIAGNOSIS — N2581 Secondary hyperparathyroidism of renal origin: Secondary | ICD-10-CM | POA: Diagnosis not present

## 2018-09-10 DIAGNOSIS — N186 End stage renal disease: Secondary | ICD-10-CM | POA: Diagnosis not present

## 2018-09-10 DIAGNOSIS — D631 Anemia in chronic kidney disease: Secondary | ICD-10-CM | POA: Diagnosis not present

## 2018-09-10 DIAGNOSIS — D689 Coagulation defect, unspecified: Secondary | ICD-10-CM | POA: Diagnosis not present

## 2018-09-10 DIAGNOSIS — L299 Pruritus, unspecified: Secondary | ICD-10-CM | POA: Diagnosis not present

## 2018-09-10 DIAGNOSIS — D509 Iron deficiency anemia, unspecified: Secondary | ICD-10-CM | POA: Diagnosis not present

## 2018-09-10 DIAGNOSIS — Z4901 Encounter for fitting and adjustment of extracorporeal dialysis catheter: Secondary | ICD-10-CM | POA: Diagnosis not present

## 2018-09-10 DIAGNOSIS — R52 Pain, unspecified: Secondary | ICD-10-CM | POA: Diagnosis not present

## 2018-09-12 DIAGNOSIS — D509 Iron deficiency anemia, unspecified: Secondary | ICD-10-CM | POA: Diagnosis not present

## 2018-09-12 DIAGNOSIS — L299 Pruritus, unspecified: Secondary | ICD-10-CM | POA: Diagnosis not present

## 2018-09-12 DIAGNOSIS — R52 Pain, unspecified: Secondary | ICD-10-CM | POA: Diagnosis not present

## 2018-09-12 DIAGNOSIS — Z4901 Encounter for fitting and adjustment of extracorporeal dialysis catheter: Secondary | ICD-10-CM | POA: Diagnosis not present

## 2018-09-12 DIAGNOSIS — E1129 Type 2 diabetes mellitus with other diabetic kidney complication: Secondary | ICD-10-CM | POA: Diagnosis not present

## 2018-09-12 DIAGNOSIS — N186 End stage renal disease: Secondary | ICD-10-CM | POA: Diagnosis not present

## 2018-09-12 DIAGNOSIS — D689 Coagulation defect, unspecified: Secondary | ICD-10-CM | POA: Diagnosis not present

## 2018-09-12 DIAGNOSIS — N2581 Secondary hyperparathyroidism of renal origin: Secondary | ICD-10-CM | POA: Diagnosis not present

## 2018-09-12 DIAGNOSIS — D631 Anemia in chronic kidney disease: Secondary | ICD-10-CM | POA: Diagnosis not present

## 2018-09-15 DIAGNOSIS — E1142 Type 2 diabetes mellitus with diabetic polyneuropathy: Secondary | ICD-10-CM | POA: Diagnosis not present

## 2018-09-15 DIAGNOSIS — I129 Hypertensive chronic kidney disease with stage 1 through stage 4 chronic kidney disease, or unspecified chronic kidney disease: Secondary | ICD-10-CM | POA: Diagnosis not present

## 2018-09-15 DIAGNOSIS — E1129 Type 2 diabetes mellitus with other diabetic kidney complication: Secondary | ICD-10-CM | POA: Diagnosis not present

## 2018-09-15 DIAGNOSIS — R52 Pain, unspecified: Secondary | ICD-10-CM | POA: Diagnosis not present

## 2018-09-15 DIAGNOSIS — Z4901 Encounter for fitting and adjustment of extracorporeal dialysis catheter: Secondary | ICD-10-CM | POA: Diagnosis not present

## 2018-09-15 DIAGNOSIS — N2581 Secondary hyperparathyroidism of renal origin: Secondary | ICD-10-CM | POA: Diagnosis not present

## 2018-09-15 DIAGNOSIS — D509 Iron deficiency anemia, unspecified: Secondary | ICD-10-CM | POA: Diagnosis not present

## 2018-09-15 DIAGNOSIS — D631 Anemia in chronic kidney disease: Secondary | ICD-10-CM | POA: Diagnosis not present

## 2018-09-15 DIAGNOSIS — E1021 Type 1 diabetes mellitus with diabetic nephropathy: Secondary | ICD-10-CM | POA: Diagnosis not present

## 2018-09-15 DIAGNOSIS — E1165 Type 2 diabetes mellitus with hyperglycemia: Secondary | ICD-10-CM | POA: Diagnosis not present

## 2018-09-15 DIAGNOSIS — N186 End stage renal disease: Secondary | ICD-10-CM | POA: Diagnosis not present

## 2018-09-15 DIAGNOSIS — D689 Coagulation defect, unspecified: Secondary | ICD-10-CM | POA: Diagnosis not present

## 2018-09-15 DIAGNOSIS — L299 Pruritus, unspecified: Secondary | ICD-10-CM | POA: Diagnosis not present

## 2018-09-17 DIAGNOSIS — N186 End stage renal disease: Secondary | ICD-10-CM | POA: Diagnosis not present

## 2018-09-17 DIAGNOSIS — N2581 Secondary hyperparathyroidism of renal origin: Secondary | ICD-10-CM | POA: Diagnosis not present

## 2018-09-17 DIAGNOSIS — L299 Pruritus, unspecified: Secondary | ICD-10-CM | POA: Diagnosis not present

## 2018-09-17 DIAGNOSIS — R52 Pain, unspecified: Secondary | ICD-10-CM | POA: Diagnosis not present

## 2018-09-17 DIAGNOSIS — E1129 Type 2 diabetes mellitus with other diabetic kidney complication: Secondary | ICD-10-CM | POA: Diagnosis not present

## 2018-09-17 DIAGNOSIS — D509 Iron deficiency anemia, unspecified: Secondary | ICD-10-CM | POA: Diagnosis not present

## 2018-09-17 DIAGNOSIS — Z4901 Encounter for fitting and adjustment of extracorporeal dialysis catheter: Secondary | ICD-10-CM | POA: Diagnosis not present

## 2018-09-17 DIAGNOSIS — D689 Coagulation defect, unspecified: Secondary | ICD-10-CM | POA: Diagnosis not present

## 2018-09-17 DIAGNOSIS — D631 Anemia in chronic kidney disease: Secondary | ICD-10-CM | POA: Diagnosis not present

## 2018-09-19 DIAGNOSIS — E1129 Type 2 diabetes mellitus with other diabetic kidney complication: Secondary | ICD-10-CM | POA: Diagnosis not present

## 2018-09-19 DIAGNOSIS — D689 Coagulation defect, unspecified: Secondary | ICD-10-CM | POA: Diagnosis not present

## 2018-09-19 DIAGNOSIS — R52 Pain, unspecified: Secondary | ICD-10-CM | POA: Diagnosis not present

## 2018-09-19 DIAGNOSIS — N2581 Secondary hyperparathyroidism of renal origin: Secondary | ICD-10-CM | POA: Diagnosis not present

## 2018-09-19 DIAGNOSIS — Z4901 Encounter for fitting and adjustment of extracorporeal dialysis catheter: Secondary | ICD-10-CM | POA: Diagnosis not present

## 2018-09-19 DIAGNOSIS — N186 End stage renal disease: Secondary | ICD-10-CM | POA: Diagnosis not present

## 2018-09-19 DIAGNOSIS — D631 Anemia in chronic kidney disease: Secondary | ICD-10-CM | POA: Diagnosis not present

## 2018-09-19 DIAGNOSIS — L299 Pruritus, unspecified: Secondary | ICD-10-CM | POA: Diagnosis not present

## 2018-09-19 DIAGNOSIS — D509 Iron deficiency anemia, unspecified: Secondary | ICD-10-CM | POA: Diagnosis not present

## 2018-09-22 DIAGNOSIS — D509 Iron deficiency anemia, unspecified: Secondary | ICD-10-CM | POA: Diagnosis not present

## 2018-09-22 DIAGNOSIS — Z7189 Other specified counseling: Secondary | ICD-10-CM | POA: Diagnosis not present

## 2018-09-22 DIAGNOSIS — E1122 Type 2 diabetes mellitus with diabetic chronic kidney disease: Secondary | ICD-10-CM | POA: Diagnosis not present

## 2018-09-22 DIAGNOSIS — N186 End stage renal disease: Secondary | ICD-10-CM | POA: Diagnosis not present

## 2018-09-22 DIAGNOSIS — E1165 Type 2 diabetes mellitus with hyperglycemia: Secondary | ICD-10-CM | POA: Diagnosis not present

## 2018-09-22 DIAGNOSIS — N2581 Secondary hyperparathyroidism of renal origin: Secondary | ICD-10-CM | POA: Diagnosis not present

## 2018-09-22 DIAGNOSIS — L299 Pruritus, unspecified: Secondary | ICD-10-CM | POA: Diagnosis not present

## 2018-09-22 DIAGNOSIS — E1021 Type 1 diabetes mellitus with diabetic nephropathy: Secondary | ICD-10-CM | POA: Diagnosis not present

## 2018-09-22 DIAGNOSIS — E1129 Type 2 diabetes mellitus with other diabetic kidney complication: Secondary | ICD-10-CM | POA: Diagnosis not present

## 2018-09-22 DIAGNOSIS — R52 Pain, unspecified: Secondary | ICD-10-CM | POA: Diagnosis not present

## 2018-09-22 DIAGNOSIS — Z992 Dependence on renal dialysis: Secondary | ICD-10-CM | POA: Diagnosis not present

## 2018-09-22 DIAGNOSIS — D631 Anemia in chronic kidney disease: Secondary | ICD-10-CM | POA: Diagnosis not present

## 2018-09-22 DIAGNOSIS — E1142 Type 2 diabetes mellitus with diabetic polyneuropathy: Secondary | ICD-10-CM | POA: Diagnosis not present

## 2018-09-22 DIAGNOSIS — Z4901 Encounter for fitting and adjustment of extracorporeal dialysis catheter: Secondary | ICD-10-CM | POA: Diagnosis not present

## 2018-09-22 DIAGNOSIS — D689 Coagulation defect, unspecified: Secondary | ICD-10-CM | POA: Diagnosis not present

## 2018-09-26 DIAGNOSIS — N2581 Secondary hyperparathyroidism of renal origin: Secondary | ICD-10-CM | POA: Diagnosis not present

## 2018-09-26 DIAGNOSIS — N186 End stage renal disease: Secondary | ICD-10-CM | POA: Diagnosis not present

## 2018-09-26 DIAGNOSIS — Z23 Encounter for immunization: Secondary | ICD-10-CM | POA: Diagnosis not present

## 2018-09-26 DIAGNOSIS — E1129 Type 2 diabetes mellitus with other diabetic kidney complication: Secondary | ICD-10-CM | POA: Diagnosis not present

## 2018-09-26 DIAGNOSIS — D509 Iron deficiency anemia, unspecified: Secondary | ICD-10-CM | POA: Diagnosis not present

## 2018-09-26 DIAGNOSIS — L299 Pruritus, unspecified: Secondary | ICD-10-CM | POA: Diagnosis not present

## 2018-09-26 DIAGNOSIS — R52 Pain, unspecified: Secondary | ICD-10-CM | POA: Diagnosis not present

## 2018-09-26 DIAGNOSIS — Z4901 Encounter for fitting and adjustment of extracorporeal dialysis catheter: Secondary | ICD-10-CM | POA: Diagnosis not present

## 2018-09-26 DIAGNOSIS — D689 Coagulation defect, unspecified: Secondary | ICD-10-CM | POA: Diagnosis not present

## 2018-09-28 DIAGNOSIS — E1142 Type 2 diabetes mellitus with diabetic polyneuropathy: Secondary | ICD-10-CM | POA: Diagnosis not present

## 2018-09-29 DIAGNOSIS — Z23 Encounter for immunization: Secondary | ICD-10-CM | POA: Diagnosis not present

## 2018-09-29 DIAGNOSIS — D509 Iron deficiency anemia, unspecified: Secondary | ICD-10-CM | POA: Diagnosis not present

## 2018-09-29 DIAGNOSIS — E1129 Type 2 diabetes mellitus with other diabetic kidney complication: Secondary | ICD-10-CM | POA: Diagnosis not present

## 2018-09-29 DIAGNOSIS — L299 Pruritus, unspecified: Secondary | ICD-10-CM | POA: Diagnosis not present

## 2018-09-29 DIAGNOSIS — Z4901 Encounter for fitting and adjustment of extracorporeal dialysis catheter: Secondary | ICD-10-CM | POA: Diagnosis not present

## 2018-09-29 DIAGNOSIS — R52 Pain, unspecified: Secondary | ICD-10-CM | POA: Diagnosis not present

## 2018-09-29 DIAGNOSIS — N2581 Secondary hyperparathyroidism of renal origin: Secondary | ICD-10-CM | POA: Diagnosis not present

## 2018-09-29 DIAGNOSIS — D689 Coagulation defect, unspecified: Secondary | ICD-10-CM | POA: Diagnosis not present

## 2018-09-29 DIAGNOSIS — N186 End stage renal disease: Secondary | ICD-10-CM | POA: Diagnosis not present

## 2018-09-30 DIAGNOSIS — I871 Compression of vein: Secondary | ICD-10-CM | POA: Diagnosis not present

## 2018-09-30 DIAGNOSIS — N186 End stage renal disease: Secondary | ICD-10-CM | POA: Diagnosis not present

## 2018-09-30 DIAGNOSIS — Z992 Dependence on renal dialysis: Secondary | ICD-10-CM | POA: Diagnosis not present

## 2018-09-30 DIAGNOSIS — T82858A Stenosis of vascular prosthetic devices, implants and grafts, initial encounter: Secondary | ICD-10-CM | POA: Diagnosis not present

## 2018-10-01 DIAGNOSIS — R52 Pain, unspecified: Secondary | ICD-10-CM | POA: Diagnosis not present

## 2018-10-01 DIAGNOSIS — N2581 Secondary hyperparathyroidism of renal origin: Secondary | ICD-10-CM | POA: Diagnosis not present

## 2018-10-01 DIAGNOSIS — Z4901 Encounter for fitting and adjustment of extracorporeal dialysis catheter: Secondary | ICD-10-CM | POA: Diagnosis not present

## 2018-10-01 DIAGNOSIS — N186 End stage renal disease: Secondary | ICD-10-CM | POA: Diagnosis not present

## 2018-10-01 DIAGNOSIS — E1129 Type 2 diabetes mellitus with other diabetic kidney complication: Secondary | ICD-10-CM | POA: Diagnosis not present

## 2018-10-01 DIAGNOSIS — L299 Pruritus, unspecified: Secondary | ICD-10-CM | POA: Diagnosis not present

## 2018-10-01 DIAGNOSIS — Z23 Encounter for immunization: Secondary | ICD-10-CM | POA: Diagnosis not present

## 2018-10-01 DIAGNOSIS — D689 Coagulation defect, unspecified: Secondary | ICD-10-CM | POA: Diagnosis not present

## 2018-10-01 DIAGNOSIS — D509 Iron deficiency anemia, unspecified: Secondary | ICD-10-CM | POA: Diagnosis not present

## 2018-10-03 DIAGNOSIS — E1129 Type 2 diabetes mellitus with other diabetic kidney complication: Secondary | ICD-10-CM | POA: Diagnosis not present

## 2018-10-03 DIAGNOSIS — L299 Pruritus, unspecified: Secondary | ICD-10-CM | POA: Diagnosis not present

## 2018-10-03 DIAGNOSIS — D509 Iron deficiency anemia, unspecified: Secondary | ICD-10-CM | POA: Diagnosis not present

## 2018-10-03 DIAGNOSIS — R52 Pain, unspecified: Secondary | ICD-10-CM | POA: Diagnosis not present

## 2018-10-03 DIAGNOSIS — N2581 Secondary hyperparathyroidism of renal origin: Secondary | ICD-10-CM | POA: Diagnosis not present

## 2018-10-03 DIAGNOSIS — Z23 Encounter for immunization: Secondary | ICD-10-CM | POA: Diagnosis not present

## 2018-10-03 DIAGNOSIS — Z4901 Encounter for fitting and adjustment of extracorporeal dialysis catheter: Secondary | ICD-10-CM | POA: Diagnosis not present

## 2018-10-03 DIAGNOSIS — D689 Coagulation defect, unspecified: Secondary | ICD-10-CM | POA: Diagnosis not present

## 2018-10-03 DIAGNOSIS — N186 End stage renal disease: Secondary | ICD-10-CM | POA: Diagnosis not present

## 2018-10-06 DIAGNOSIS — L299 Pruritus, unspecified: Secondary | ICD-10-CM | POA: Diagnosis not present

## 2018-10-06 DIAGNOSIS — D689 Coagulation defect, unspecified: Secondary | ICD-10-CM | POA: Diagnosis not present

## 2018-10-06 DIAGNOSIS — E1129 Type 2 diabetes mellitus with other diabetic kidney complication: Secondary | ICD-10-CM | POA: Diagnosis not present

## 2018-10-06 DIAGNOSIS — R52 Pain, unspecified: Secondary | ICD-10-CM | POA: Diagnosis not present

## 2018-10-06 DIAGNOSIS — N2581 Secondary hyperparathyroidism of renal origin: Secondary | ICD-10-CM | POA: Diagnosis not present

## 2018-10-06 DIAGNOSIS — Z4901 Encounter for fitting and adjustment of extracorporeal dialysis catheter: Secondary | ICD-10-CM | POA: Diagnosis not present

## 2018-10-06 DIAGNOSIS — N186 End stage renal disease: Secondary | ICD-10-CM | POA: Diagnosis not present

## 2018-10-06 DIAGNOSIS — D509 Iron deficiency anemia, unspecified: Secondary | ICD-10-CM | POA: Diagnosis not present

## 2018-10-06 DIAGNOSIS — Z23 Encounter for immunization: Secondary | ICD-10-CM | POA: Diagnosis not present

## 2018-10-07 DIAGNOSIS — Z992 Dependence on renal dialysis: Secondary | ICD-10-CM | POA: Diagnosis not present

## 2018-10-07 DIAGNOSIS — I871 Compression of vein: Secondary | ICD-10-CM | POA: Diagnosis not present

## 2018-10-07 DIAGNOSIS — N186 End stage renal disease: Secondary | ICD-10-CM | POA: Diagnosis not present

## 2018-10-08 DIAGNOSIS — D689 Coagulation defect, unspecified: Secondary | ICD-10-CM | POA: Diagnosis not present

## 2018-10-08 DIAGNOSIS — L299 Pruritus, unspecified: Secondary | ICD-10-CM | POA: Diagnosis not present

## 2018-10-08 DIAGNOSIS — D509 Iron deficiency anemia, unspecified: Secondary | ICD-10-CM | POA: Diagnosis not present

## 2018-10-08 DIAGNOSIS — Z4901 Encounter for fitting and adjustment of extracorporeal dialysis catheter: Secondary | ICD-10-CM | POA: Diagnosis not present

## 2018-10-08 DIAGNOSIS — R52 Pain, unspecified: Secondary | ICD-10-CM | POA: Diagnosis not present

## 2018-10-08 DIAGNOSIS — N2581 Secondary hyperparathyroidism of renal origin: Secondary | ICD-10-CM | POA: Diagnosis not present

## 2018-10-08 DIAGNOSIS — E1129 Type 2 diabetes mellitus with other diabetic kidney complication: Secondary | ICD-10-CM | POA: Diagnosis not present

## 2018-10-08 DIAGNOSIS — N186 End stage renal disease: Secondary | ICD-10-CM | POA: Diagnosis not present

## 2018-10-08 DIAGNOSIS — Z23 Encounter for immunization: Secondary | ICD-10-CM | POA: Diagnosis not present

## 2018-10-10 DIAGNOSIS — E1129 Type 2 diabetes mellitus with other diabetic kidney complication: Secondary | ICD-10-CM | POA: Diagnosis not present

## 2018-10-10 DIAGNOSIS — Z4901 Encounter for fitting and adjustment of extracorporeal dialysis catheter: Secondary | ICD-10-CM | POA: Diagnosis not present

## 2018-10-10 DIAGNOSIS — N2581 Secondary hyperparathyroidism of renal origin: Secondary | ICD-10-CM | POA: Diagnosis not present

## 2018-10-10 DIAGNOSIS — R52 Pain, unspecified: Secondary | ICD-10-CM | POA: Diagnosis not present

## 2018-10-10 DIAGNOSIS — D689 Coagulation defect, unspecified: Secondary | ICD-10-CM | POA: Diagnosis not present

## 2018-10-10 DIAGNOSIS — N186 End stage renal disease: Secondary | ICD-10-CM | POA: Diagnosis not present

## 2018-10-10 DIAGNOSIS — D509 Iron deficiency anemia, unspecified: Secondary | ICD-10-CM | POA: Diagnosis not present

## 2018-10-10 DIAGNOSIS — L299 Pruritus, unspecified: Secondary | ICD-10-CM | POA: Diagnosis not present

## 2018-10-10 DIAGNOSIS — Z23 Encounter for immunization: Secondary | ICD-10-CM | POA: Diagnosis not present

## 2018-10-13 DIAGNOSIS — Z4901 Encounter for fitting and adjustment of extracorporeal dialysis catheter: Secondary | ICD-10-CM | POA: Diagnosis not present

## 2018-10-13 DIAGNOSIS — L299 Pruritus, unspecified: Secondary | ICD-10-CM | POA: Diagnosis not present

## 2018-10-13 DIAGNOSIS — R52 Pain, unspecified: Secondary | ICD-10-CM | POA: Diagnosis not present

## 2018-10-13 DIAGNOSIS — D509 Iron deficiency anemia, unspecified: Secondary | ICD-10-CM | POA: Diagnosis not present

## 2018-10-13 DIAGNOSIS — Z23 Encounter for immunization: Secondary | ICD-10-CM | POA: Diagnosis not present

## 2018-10-13 DIAGNOSIS — E1129 Type 2 diabetes mellitus with other diabetic kidney complication: Secondary | ICD-10-CM | POA: Diagnosis not present

## 2018-10-13 DIAGNOSIS — N2581 Secondary hyperparathyroidism of renal origin: Secondary | ICD-10-CM | POA: Diagnosis not present

## 2018-10-13 DIAGNOSIS — D689 Coagulation defect, unspecified: Secondary | ICD-10-CM | POA: Diagnosis not present

## 2018-10-13 DIAGNOSIS — N186 End stage renal disease: Secondary | ICD-10-CM | POA: Diagnosis not present

## 2018-10-15 DIAGNOSIS — N2581 Secondary hyperparathyroidism of renal origin: Secondary | ICD-10-CM | POA: Diagnosis not present

## 2018-10-15 DIAGNOSIS — R52 Pain, unspecified: Secondary | ICD-10-CM | POA: Diagnosis not present

## 2018-10-15 DIAGNOSIS — L299 Pruritus, unspecified: Secondary | ICD-10-CM | POA: Diagnosis not present

## 2018-10-15 DIAGNOSIS — E1129 Type 2 diabetes mellitus with other diabetic kidney complication: Secondary | ICD-10-CM | POA: Diagnosis not present

## 2018-10-15 DIAGNOSIS — Z4901 Encounter for fitting and adjustment of extracorporeal dialysis catheter: Secondary | ICD-10-CM | POA: Diagnosis not present

## 2018-10-15 DIAGNOSIS — Z23 Encounter for immunization: Secondary | ICD-10-CM | POA: Diagnosis not present

## 2018-10-15 DIAGNOSIS — D689 Coagulation defect, unspecified: Secondary | ICD-10-CM | POA: Diagnosis not present

## 2018-10-15 DIAGNOSIS — D509 Iron deficiency anemia, unspecified: Secondary | ICD-10-CM | POA: Diagnosis not present

## 2018-10-15 DIAGNOSIS — N186 End stage renal disease: Secondary | ICD-10-CM | POA: Diagnosis not present

## 2018-10-17 DIAGNOSIS — E1129 Type 2 diabetes mellitus with other diabetic kidney complication: Secondary | ICD-10-CM | POA: Diagnosis not present

## 2018-10-17 DIAGNOSIS — Z23 Encounter for immunization: Secondary | ICD-10-CM | POA: Diagnosis not present

## 2018-10-17 DIAGNOSIS — R52 Pain, unspecified: Secondary | ICD-10-CM | POA: Diagnosis not present

## 2018-10-17 DIAGNOSIS — N2581 Secondary hyperparathyroidism of renal origin: Secondary | ICD-10-CM | POA: Diagnosis not present

## 2018-10-17 DIAGNOSIS — D509 Iron deficiency anemia, unspecified: Secondary | ICD-10-CM | POA: Diagnosis not present

## 2018-10-17 DIAGNOSIS — Z4901 Encounter for fitting and adjustment of extracorporeal dialysis catheter: Secondary | ICD-10-CM | POA: Diagnosis not present

## 2018-10-17 DIAGNOSIS — D689 Coagulation defect, unspecified: Secondary | ICD-10-CM | POA: Diagnosis not present

## 2018-10-17 DIAGNOSIS — L299 Pruritus, unspecified: Secondary | ICD-10-CM | POA: Diagnosis not present

## 2018-10-17 DIAGNOSIS — N186 End stage renal disease: Secondary | ICD-10-CM | POA: Diagnosis not present

## 2018-10-20 DIAGNOSIS — D509 Iron deficiency anemia, unspecified: Secondary | ICD-10-CM | POA: Diagnosis not present

## 2018-10-20 DIAGNOSIS — N2581 Secondary hyperparathyroidism of renal origin: Secondary | ICD-10-CM | POA: Diagnosis not present

## 2018-10-20 DIAGNOSIS — R52 Pain, unspecified: Secondary | ICD-10-CM | POA: Diagnosis not present

## 2018-10-20 DIAGNOSIS — Z4901 Encounter for fitting and adjustment of extracorporeal dialysis catheter: Secondary | ICD-10-CM | POA: Diagnosis not present

## 2018-10-20 DIAGNOSIS — D689 Coagulation defect, unspecified: Secondary | ICD-10-CM | POA: Diagnosis not present

## 2018-10-20 DIAGNOSIS — Z23 Encounter for immunization: Secondary | ICD-10-CM | POA: Diagnosis not present

## 2018-10-20 DIAGNOSIS — L299 Pruritus, unspecified: Secondary | ICD-10-CM | POA: Diagnosis not present

## 2018-10-20 DIAGNOSIS — N186 End stage renal disease: Secondary | ICD-10-CM | POA: Diagnosis not present

## 2018-10-20 DIAGNOSIS — E1129 Type 2 diabetes mellitus with other diabetic kidney complication: Secondary | ICD-10-CM | POA: Diagnosis not present

## 2018-10-21 DIAGNOSIS — N186 End stage renal disease: Secondary | ICD-10-CM | POA: Diagnosis not present

## 2018-10-21 DIAGNOSIS — T82858A Stenosis of vascular prosthetic devices, implants and grafts, initial encounter: Secondary | ICD-10-CM | POA: Diagnosis not present

## 2018-10-21 DIAGNOSIS — I871 Compression of vein: Secondary | ICD-10-CM | POA: Diagnosis not present

## 2018-10-21 DIAGNOSIS — Z992 Dependence on renal dialysis: Secondary | ICD-10-CM | POA: Diagnosis not present

## 2018-10-22 DIAGNOSIS — L299 Pruritus, unspecified: Secondary | ICD-10-CM | POA: Diagnosis not present

## 2018-10-22 DIAGNOSIS — D689 Coagulation defect, unspecified: Secondary | ICD-10-CM | POA: Diagnosis not present

## 2018-10-22 DIAGNOSIS — D509 Iron deficiency anemia, unspecified: Secondary | ICD-10-CM | POA: Diagnosis not present

## 2018-10-22 DIAGNOSIS — Z4901 Encounter for fitting and adjustment of extracorporeal dialysis catheter: Secondary | ICD-10-CM | POA: Diagnosis not present

## 2018-10-22 DIAGNOSIS — Z23 Encounter for immunization: Secondary | ICD-10-CM | POA: Diagnosis not present

## 2018-10-22 DIAGNOSIS — E1129 Type 2 diabetes mellitus with other diabetic kidney complication: Secondary | ICD-10-CM | POA: Diagnosis not present

## 2018-10-22 DIAGNOSIS — R52 Pain, unspecified: Secondary | ICD-10-CM | POA: Diagnosis not present

## 2018-10-22 DIAGNOSIS — N2581 Secondary hyperparathyroidism of renal origin: Secondary | ICD-10-CM | POA: Diagnosis not present

## 2018-10-22 DIAGNOSIS — N186 End stage renal disease: Secondary | ICD-10-CM | POA: Diagnosis not present

## 2018-10-23 DIAGNOSIS — E1122 Type 2 diabetes mellitus with diabetic chronic kidney disease: Secondary | ICD-10-CM | POA: Diagnosis not present

## 2018-10-23 DIAGNOSIS — N186 End stage renal disease: Secondary | ICD-10-CM | POA: Diagnosis not present

## 2018-10-23 DIAGNOSIS — Z992 Dependence on renal dialysis: Secondary | ICD-10-CM | POA: Diagnosis not present

## 2018-10-24 DIAGNOSIS — D689 Coagulation defect, unspecified: Secondary | ICD-10-CM | POA: Diagnosis not present

## 2018-10-24 DIAGNOSIS — N186 End stage renal disease: Secondary | ICD-10-CM | POA: Diagnosis not present

## 2018-10-24 DIAGNOSIS — E1129 Type 2 diabetes mellitus with other diabetic kidney complication: Secondary | ICD-10-CM | POA: Diagnosis not present

## 2018-10-24 DIAGNOSIS — Z4901 Encounter for fitting and adjustment of extracorporeal dialysis catheter: Secondary | ICD-10-CM | POA: Diagnosis not present

## 2018-10-24 DIAGNOSIS — R52 Pain, unspecified: Secondary | ICD-10-CM | POA: Diagnosis not present

## 2018-10-24 DIAGNOSIS — Z992 Dependence on renal dialysis: Secondary | ICD-10-CM | POA: Diagnosis not present

## 2018-10-24 DIAGNOSIS — D631 Anemia in chronic kidney disease: Secondary | ICD-10-CM | POA: Diagnosis not present

## 2018-10-24 DIAGNOSIS — N2581 Secondary hyperparathyroidism of renal origin: Secondary | ICD-10-CM | POA: Diagnosis not present

## 2018-10-24 DIAGNOSIS — L299 Pruritus, unspecified: Secondary | ICD-10-CM | POA: Diagnosis not present

## 2018-10-27 DIAGNOSIS — D689 Coagulation defect, unspecified: Secondary | ICD-10-CM | POA: Diagnosis not present

## 2018-10-27 DIAGNOSIS — Z4901 Encounter for fitting and adjustment of extracorporeal dialysis catheter: Secondary | ICD-10-CM | POA: Diagnosis not present

## 2018-10-27 DIAGNOSIS — E1129 Type 2 diabetes mellitus with other diabetic kidney complication: Secondary | ICD-10-CM | POA: Diagnosis not present

## 2018-10-27 DIAGNOSIS — Z992 Dependence on renal dialysis: Secondary | ICD-10-CM | POA: Diagnosis not present

## 2018-10-27 DIAGNOSIS — L299 Pruritus, unspecified: Secondary | ICD-10-CM | POA: Diagnosis not present

## 2018-10-27 DIAGNOSIS — D631 Anemia in chronic kidney disease: Secondary | ICD-10-CM | POA: Diagnosis not present

## 2018-10-27 DIAGNOSIS — R52 Pain, unspecified: Secondary | ICD-10-CM | POA: Diagnosis not present

## 2018-10-27 DIAGNOSIS — N186 End stage renal disease: Secondary | ICD-10-CM | POA: Diagnosis not present

## 2018-10-27 DIAGNOSIS — N2581 Secondary hyperparathyroidism of renal origin: Secondary | ICD-10-CM | POA: Diagnosis not present

## 2018-10-29 DIAGNOSIS — D631 Anemia in chronic kidney disease: Secondary | ICD-10-CM | POA: Diagnosis not present

## 2018-10-29 DIAGNOSIS — D689 Coagulation defect, unspecified: Secondary | ICD-10-CM | POA: Diagnosis not present

## 2018-10-29 DIAGNOSIS — N2581 Secondary hyperparathyroidism of renal origin: Secondary | ICD-10-CM | POA: Diagnosis not present

## 2018-10-29 DIAGNOSIS — R52 Pain, unspecified: Secondary | ICD-10-CM | POA: Diagnosis not present

## 2018-10-29 DIAGNOSIS — Z992 Dependence on renal dialysis: Secondary | ICD-10-CM | POA: Diagnosis not present

## 2018-10-29 DIAGNOSIS — Z4901 Encounter for fitting and adjustment of extracorporeal dialysis catheter: Secondary | ICD-10-CM | POA: Diagnosis not present

## 2018-10-29 DIAGNOSIS — E1129 Type 2 diabetes mellitus with other diabetic kidney complication: Secondary | ICD-10-CM | POA: Diagnosis not present

## 2018-10-29 DIAGNOSIS — N186 End stage renal disease: Secondary | ICD-10-CM | POA: Diagnosis not present

## 2018-10-29 DIAGNOSIS — L299 Pruritus, unspecified: Secondary | ICD-10-CM | POA: Diagnosis not present

## 2018-10-31 DIAGNOSIS — Z4901 Encounter for fitting and adjustment of extracorporeal dialysis catheter: Secondary | ICD-10-CM | POA: Diagnosis not present

## 2018-10-31 DIAGNOSIS — D631 Anemia in chronic kidney disease: Secondary | ICD-10-CM | POA: Diagnosis not present

## 2018-10-31 DIAGNOSIS — E1129 Type 2 diabetes mellitus with other diabetic kidney complication: Secondary | ICD-10-CM | POA: Diagnosis not present

## 2018-10-31 DIAGNOSIS — R52 Pain, unspecified: Secondary | ICD-10-CM | POA: Diagnosis not present

## 2018-10-31 DIAGNOSIS — Z992 Dependence on renal dialysis: Secondary | ICD-10-CM | POA: Diagnosis not present

## 2018-10-31 DIAGNOSIS — D689 Coagulation defect, unspecified: Secondary | ICD-10-CM | POA: Diagnosis not present

## 2018-10-31 DIAGNOSIS — N186 End stage renal disease: Secondary | ICD-10-CM | POA: Diagnosis not present

## 2018-10-31 DIAGNOSIS — L299 Pruritus, unspecified: Secondary | ICD-10-CM | POA: Diagnosis not present

## 2018-10-31 DIAGNOSIS — N2581 Secondary hyperparathyroidism of renal origin: Secondary | ICD-10-CM | POA: Diagnosis not present

## 2018-11-03 DIAGNOSIS — Z992 Dependence on renal dialysis: Secondary | ICD-10-CM | POA: Diagnosis not present

## 2018-11-03 DIAGNOSIS — D689 Coagulation defect, unspecified: Secondary | ICD-10-CM | POA: Diagnosis not present

## 2018-11-03 DIAGNOSIS — D631 Anemia in chronic kidney disease: Secondary | ICD-10-CM | POA: Diagnosis not present

## 2018-11-03 DIAGNOSIS — R52 Pain, unspecified: Secondary | ICD-10-CM | POA: Diagnosis not present

## 2018-11-03 DIAGNOSIS — E1129 Type 2 diabetes mellitus with other diabetic kidney complication: Secondary | ICD-10-CM | POA: Diagnosis not present

## 2018-11-03 DIAGNOSIS — N2581 Secondary hyperparathyroidism of renal origin: Secondary | ICD-10-CM | POA: Diagnosis not present

## 2018-11-03 DIAGNOSIS — L299 Pruritus, unspecified: Secondary | ICD-10-CM | POA: Diagnosis not present

## 2018-11-03 DIAGNOSIS — Z4901 Encounter for fitting and adjustment of extracorporeal dialysis catheter: Secondary | ICD-10-CM | POA: Diagnosis not present

## 2018-11-03 DIAGNOSIS — N186 End stage renal disease: Secondary | ICD-10-CM | POA: Diagnosis not present

## 2018-11-05 DIAGNOSIS — N2581 Secondary hyperparathyroidism of renal origin: Secondary | ICD-10-CM | POA: Diagnosis not present

## 2018-11-05 DIAGNOSIS — L299 Pruritus, unspecified: Secondary | ICD-10-CM | POA: Diagnosis not present

## 2018-11-05 DIAGNOSIS — D689 Coagulation defect, unspecified: Secondary | ICD-10-CM | POA: Diagnosis not present

## 2018-11-05 DIAGNOSIS — N186 End stage renal disease: Secondary | ICD-10-CM | POA: Diagnosis not present

## 2018-11-05 DIAGNOSIS — Z4901 Encounter for fitting and adjustment of extracorporeal dialysis catheter: Secondary | ICD-10-CM | POA: Diagnosis not present

## 2018-11-05 DIAGNOSIS — Z992 Dependence on renal dialysis: Secondary | ICD-10-CM | POA: Diagnosis not present

## 2018-11-05 DIAGNOSIS — E1129 Type 2 diabetes mellitus with other diabetic kidney complication: Secondary | ICD-10-CM | POA: Diagnosis not present

## 2018-11-05 DIAGNOSIS — R52 Pain, unspecified: Secondary | ICD-10-CM | POA: Diagnosis not present

## 2018-11-05 DIAGNOSIS — D631 Anemia in chronic kidney disease: Secondary | ICD-10-CM | POA: Diagnosis not present

## 2018-11-07 DIAGNOSIS — D689 Coagulation defect, unspecified: Secondary | ICD-10-CM | POA: Diagnosis not present

## 2018-11-07 DIAGNOSIS — D631 Anemia in chronic kidney disease: Secondary | ICD-10-CM | POA: Diagnosis not present

## 2018-11-07 DIAGNOSIS — R52 Pain, unspecified: Secondary | ICD-10-CM | POA: Diagnosis not present

## 2018-11-07 DIAGNOSIS — Z4901 Encounter for fitting and adjustment of extracorporeal dialysis catheter: Secondary | ICD-10-CM | POA: Diagnosis not present

## 2018-11-07 DIAGNOSIS — Z992 Dependence on renal dialysis: Secondary | ICD-10-CM | POA: Diagnosis not present

## 2018-11-07 DIAGNOSIS — E1129 Type 2 diabetes mellitus with other diabetic kidney complication: Secondary | ICD-10-CM | POA: Diagnosis not present

## 2018-11-07 DIAGNOSIS — N2581 Secondary hyperparathyroidism of renal origin: Secondary | ICD-10-CM | POA: Diagnosis not present

## 2018-11-07 DIAGNOSIS — L299 Pruritus, unspecified: Secondary | ICD-10-CM | POA: Diagnosis not present

## 2018-11-07 DIAGNOSIS — N186 End stage renal disease: Secondary | ICD-10-CM | POA: Diagnosis not present

## 2018-11-10 DIAGNOSIS — L299 Pruritus, unspecified: Secondary | ICD-10-CM | POA: Diagnosis not present

## 2018-11-10 DIAGNOSIS — N186 End stage renal disease: Secondary | ICD-10-CM | POA: Diagnosis not present

## 2018-11-10 DIAGNOSIS — N2581 Secondary hyperparathyroidism of renal origin: Secondary | ICD-10-CM | POA: Diagnosis not present

## 2018-11-10 DIAGNOSIS — D689 Coagulation defect, unspecified: Secondary | ICD-10-CM | POA: Diagnosis not present

## 2018-11-10 DIAGNOSIS — D631 Anemia in chronic kidney disease: Secondary | ICD-10-CM | POA: Diagnosis not present

## 2018-11-10 DIAGNOSIS — Z4901 Encounter for fitting and adjustment of extracorporeal dialysis catheter: Secondary | ICD-10-CM | POA: Diagnosis not present

## 2018-11-10 DIAGNOSIS — Z992 Dependence on renal dialysis: Secondary | ICD-10-CM | POA: Diagnosis not present

## 2018-11-10 DIAGNOSIS — E1129 Type 2 diabetes mellitus with other diabetic kidney complication: Secondary | ICD-10-CM | POA: Diagnosis not present

## 2018-11-10 DIAGNOSIS — R52 Pain, unspecified: Secondary | ICD-10-CM | POA: Diagnosis not present

## 2018-11-12 DIAGNOSIS — Z4901 Encounter for fitting and adjustment of extracorporeal dialysis catheter: Secondary | ICD-10-CM | POA: Diagnosis not present

## 2018-11-12 DIAGNOSIS — L299 Pruritus, unspecified: Secondary | ICD-10-CM | POA: Diagnosis not present

## 2018-11-12 DIAGNOSIS — D689 Coagulation defect, unspecified: Secondary | ICD-10-CM | POA: Diagnosis not present

## 2018-11-12 DIAGNOSIS — E1129 Type 2 diabetes mellitus with other diabetic kidney complication: Secondary | ICD-10-CM | POA: Diagnosis not present

## 2018-11-12 DIAGNOSIS — Z992 Dependence on renal dialysis: Secondary | ICD-10-CM | POA: Diagnosis not present

## 2018-11-12 DIAGNOSIS — R52 Pain, unspecified: Secondary | ICD-10-CM | POA: Diagnosis not present

## 2018-11-12 DIAGNOSIS — N2581 Secondary hyperparathyroidism of renal origin: Secondary | ICD-10-CM | POA: Diagnosis not present

## 2018-11-12 DIAGNOSIS — D631 Anemia in chronic kidney disease: Secondary | ICD-10-CM | POA: Diagnosis not present

## 2018-11-12 DIAGNOSIS — N186 End stage renal disease: Secondary | ICD-10-CM | POA: Diagnosis not present

## 2018-11-14 DIAGNOSIS — D689 Coagulation defect, unspecified: Secondary | ICD-10-CM | POA: Diagnosis not present

## 2018-11-14 DIAGNOSIS — Z4901 Encounter for fitting and adjustment of extracorporeal dialysis catheter: Secondary | ICD-10-CM | POA: Diagnosis not present

## 2018-11-14 DIAGNOSIS — E1129 Type 2 diabetes mellitus with other diabetic kidney complication: Secondary | ICD-10-CM | POA: Diagnosis not present

## 2018-11-14 DIAGNOSIS — Z992 Dependence on renal dialysis: Secondary | ICD-10-CM | POA: Diagnosis not present

## 2018-11-14 DIAGNOSIS — N186 End stage renal disease: Secondary | ICD-10-CM | POA: Diagnosis not present

## 2018-11-14 DIAGNOSIS — D631 Anemia in chronic kidney disease: Secondary | ICD-10-CM | POA: Diagnosis not present

## 2018-11-14 DIAGNOSIS — L299 Pruritus, unspecified: Secondary | ICD-10-CM | POA: Diagnosis not present

## 2018-11-14 DIAGNOSIS — R52 Pain, unspecified: Secondary | ICD-10-CM | POA: Diagnosis not present

## 2018-11-14 DIAGNOSIS — N2581 Secondary hyperparathyroidism of renal origin: Secondary | ICD-10-CM | POA: Diagnosis not present

## 2018-11-17 DIAGNOSIS — D631 Anemia in chronic kidney disease: Secondary | ICD-10-CM | POA: Diagnosis not present

## 2018-11-17 DIAGNOSIS — R52 Pain, unspecified: Secondary | ICD-10-CM | POA: Diagnosis not present

## 2018-11-17 DIAGNOSIS — D689 Coagulation defect, unspecified: Secondary | ICD-10-CM | POA: Diagnosis not present

## 2018-11-17 DIAGNOSIS — E1129 Type 2 diabetes mellitus with other diabetic kidney complication: Secondary | ICD-10-CM | POA: Diagnosis not present

## 2018-11-17 DIAGNOSIS — Z4901 Encounter for fitting and adjustment of extracorporeal dialysis catheter: Secondary | ICD-10-CM | POA: Diagnosis not present

## 2018-11-17 DIAGNOSIS — N186 End stage renal disease: Secondary | ICD-10-CM | POA: Diagnosis not present

## 2018-11-17 DIAGNOSIS — L299 Pruritus, unspecified: Secondary | ICD-10-CM | POA: Diagnosis not present

## 2018-11-17 DIAGNOSIS — N2581 Secondary hyperparathyroidism of renal origin: Secondary | ICD-10-CM | POA: Diagnosis not present

## 2018-11-17 DIAGNOSIS — Z992 Dependence on renal dialysis: Secondary | ICD-10-CM | POA: Diagnosis not present

## 2018-11-19 DIAGNOSIS — Z4901 Encounter for fitting and adjustment of extracorporeal dialysis catheter: Secondary | ICD-10-CM | POA: Diagnosis not present

## 2018-11-19 DIAGNOSIS — D631 Anemia in chronic kidney disease: Secondary | ICD-10-CM | POA: Diagnosis not present

## 2018-11-19 DIAGNOSIS — N186 End stage renal disease: Secondary | ICD-10-CM | POA: Diagnosis not present

## 2018-11-19 DIAGNOSIS — Z992 Dependence on renal dialysis: Secondary | ICD-10-CM | POA: Diagnosis not present

## 2018-11-19 DIAGNOSIS — E1129 Type 2 diabetes mellitus with other diabetic kidney complication: Secondary | ICD-10-CM | POA: Diagnosis not present

## 2018-11-19 DIAGNOSIS — N2581 Secondary hyperparathyroidism of renal origin: Secondary | ICD-10-CM | POA: Diagnosis not present

## 2018-11-19 DIAGNOSIS — L299 Pruritus, unspecified: Secondary | ICD-10-CM | POA: Diagnosis not present

## 2018-11-19 DIAGNOSIS — D689 Coagulation defect, unspecified: Secondary | ICD-10-CM | POA: Diagnosis not present

## 2018-11-19 DIAGNOSIS — R52 Pain, unspecified: Secondary | ICD-10-CM | POA: Diagnosis not present

## 2018-11-21 DIAGNOSIS — Z4901 Encounter for fitting and adjustment of extracorporeal dialysis catheter: Secondary | ICD-10-CM | POA: Diagnosis not present

## 2018-11-21 DIAGNOSIS — L299 Pruritus, unspecified: Secondary | ICD-10-CM | POA: Diagnosis not present

## 2018-11-21 DIAGNOSIS — N2581 Secondary hyperparathyroidism of renal origin: Secondary | ICD-10-CM | POA: Diagnosis not present

## 2018-11-21 DIAGNOSIS — D631 Anemia in chronic kidney disease: Secondary | ICD-10-CM | POA: Diagnosis not present

## 2018-11-21 DIAGNOSIS — E1129 Type 2 diabetes mellitus with other diabetic kidney complication: Secondary | ICD-10-CM | POA: Diagnosis not present

## 2018-11-21 DIAGNOSIS — R52 Pain, unspecified: Secondary | ICD-10-CM | POA: Diagnosis not present

## 2018-11-21 DIAGNOSIS — Z992 Dependence on renal dialysis: Secondary | ICD-10-CM | POA: Diagnosis not present

## 2018-11-21 DIAGNOSIS — N186 End stage renal disease: Secondary | ICD-10-CM | POA: Diagnosis not present

## 2018-11-21 DIAGNOSIS — D689 Coagulation defect, unspecified: Secondary | ICD-10-CM | POA: Diagnosis not present

## 2018-11-23 DIAGNOSIS — E1122 Type 2 diabetes mellitus with diabetic chronic kidney disease: Secondary | ICD-10-CM | POA: Diagnosis not present

## 2018-11-23 DIAGNOSIS — N186 End stage renal disease: Secondary | ICD-10-CM | POA: Diagnosis not present

## 2018-11-23 DIAGNOSIS — Z992 Dependence on renal dialysis: Secondary | ICD-10-CM | POA: Diagnosis not present

## 2018-11-24 DIAGNOSIS — D631 Anemia in chronic kidney disease: Secondary | ICD-10-CM | POA: Diagnosis not present

## 2018-11-24 DIAGNOSIS — E1129 Type 2 diabetes mellitus with other diabetic kidney complication: Secondary | ICD-10-CM | POA: Diagnosis not present

## 2018-11-24 DIAGNOSIS — R52 Pain, unspecified: Secondary | ICD-10-CM | POA: Diagnosis not present

## 2018-11-24 DIAGNOSIS — D689 Coagulation defect, unspecified: Secondary | ICD-10-CM | POA: Diagnosis not present

## 2018-11-24 DIAGNOSIS — Z992 Dependence on renal dialysis: Secondary | ICD-10-CM | POA: Diagnosis not present

## 2018-11-24 DIAGNOSIS — N186 End stage renal disease: Secondary | ICD-10-CM | POA: Diagnosis not present

## 2018-11-24 DIAGNOSIS — Z4901 Encounter for fitting and adjustment of extracorporeal dialysis catheter: Secondary | ICD-10-CM | POA: Diagnosis not present

## 2018-11-24 DIAGNOSIS — N2581 Secondary hyperparathyroidism of renal origin: Secondary | ICD-10-CM | POA: Diagnosis not present

## 2018-11-26 DIAGNOSIS — D689 Coagulation defect, unspecified: Secondary | ICD-10-CM | POA: Diagnosis not present

## 2018-11-26 DIAGNOSIS — N2581 Secondary hyperparathyroidism of renal origin: Secondary | ICD-10-CM | POA: Diagnosis not present

## 2018-11-26 DIAGNOSIS — Z4901 Encounter for fitting and adjustment of extracorporeal dialysis catheter: Secondary | ICD-10-CM | POA: Diagnosis not present

## 2018-11-26 DIAGNOSIS — N186 End stage renal disease: Secondary | ICD-10-CM | POA: Diagnosis not present

## 2018-11-26 DIAGNOSIS — Z992 Dependence on renal dialysis: Secondary | ICD-10-CM | POA: Diagnosis not present

## 2018-11-26 DIAGNOSIS — R52 Pain, unspecified: Secondary | ICD-10-CM | POA: Diagnosis not present

## 2018-11-26 DIAGNOSIS — E1129 Type 2 diabetes mellitus with other diabetic kidney complication: Secondary | ICD-10-CM | POA: Diagnosis not present

## 2018-11-26 DIAGNOSIS — D631 Anemia in chronic kidney disease: Secondary | ICD-10-CM | POA: Diagnosis not present

## 2018-11-27 DIAGNOSIS — N186 End stage renal disease: Secondary | ICD-10-CM | POA: Diagnosis not present

## 2018-11-27 DIAGNOSIS — I871 Compression of vein: Secondary | ICD-10-CM | POA: Diagnosis not present

## 2018-11-27 DIAGNOSIS — Z992 Dependence on renal dialysis: Secondary | ICD-10-CM | POA: Diagnosis not present

## 2018-11-27 DIAGNOSIS — T82858A Stenosis of vascular prosthetic devices, implants and grafts, initial encounter: Secondary | ICD-10-CM | POA: Diagnosis not present

## 2018-11-28 DIAGNOSIS — Z4901 Encounter for fitting and adjustment of extracorporeal dialysis catheter: Secondary | ICD-10-CM | POA: Diagnosis not present

## 2018-11-28 DIAGNOSIS — D689 Coagulation defect, unspecified: Secondary | ICD-10-CM | POA: Diagnosis not present

## 2018-11-28 DIAGNOSIS — Z992 Dependence on renal dialysis: Secondary | ICD-10-CM | POA: Diagnosis not present

## 2018-11-28 DIAGNOSIS — N2581 Secondary hyperparathyroidism of renal origin: Secondary | ICD-10-CM | POA: Diagnosis not present

## 2018-11-28 DIAGNOSIS — N186 End stage renal disease: Secondary | ICD-10-CM | POA: Diagnosis not present

## 2018-11-28 DIAGNOSIS — D631 Anemia in chronic kidney disease: Secondary | ICD-10-CM | POA: Diagnosis not present

## 2018-11-28 DIAGNOSIS — R52 Pain, unspecified: Secondary | ICD-10-CM | POA: Diagnosis not present

## 2018-11-28 DIAGNOSIS — E1129 Type 2 diabetes mellitus with other diabetic kidney complication: Secondary | ICD-10-CM | POA: Diagnosis not present

## 2018-12-01 DIAGNOSIS — D631 Anemia in chronic kidney disease: Secondary | ICD-10-CM | POA: Diagnosis not present

## 2018-12-01 DIAGNOSIS — E1129 Type 2 diabetes mellitus with other diabetic kidney complication: Secondary | ICD-10-CM | POA: Diagnosis not present

## 2018-12-01 DIAGNOSIS — N2581 Secondary hyperparathyroidism of renal origin: Secondary | ICD-10-CM | POA: Diagnosis not present

## 2018-12-01 DIAGNOSIS — N186 End stage renal disease: Secondary | ICD-10-CM | POA: Diagnosis not present

## 2018-12-01 DIAGNOSIS — D689 Coagulation defect, unspecified: Secondary | ICD-10-CM | POA: Diagnosis not present

## 2018-12-01 DIAGNOSIS — R52 Pain, unspecified: Secondary | ICD-10-CM | POA: Diagnosis not present

## 2018-12-01 DIAGNOSIS — Z992 Dependence on renal dialysis: Secondary | ICD-10-CM | POA: Diagnosis not present

## 2018-12-01 DIAGNOSIS — Z4901 Encounter for fitting and adjustment of extracorporeal dialysis catheter: Secondary | ICD-10-CM | POA: Diagnosis not present

## 2018-12-03 DIAGNOSIS — N2581 Secondary hyperparathyroidism of renal origin: Secondary | ICD-10-CM | POA: Diagnosis not present

## 2018-12-03 DIAGNOSIS — E1129 Type 2 diabetes mellitus with other diabetic kidney complication: Secondary | ICD-10-CM | POA: Diagnosis not present

## 2018-12-03 DIAGNOSIS — R52 Pain, unspecified: Secondary | ICD-10-CM | POA: Diagnosis not present

## 2018-12-03 DIAGNOSIS — D631 Anemia in chronic kidney disease: Secondary | ICD-10-CM | POA: Diagnosis not present

## 2018-12-03 DIAGNOSIS — Z4901 Encounter for fitting and adjustment of extracorporeal dialysis catheter: Secondary | ICD-10-CM | POA: Diagnosis not present

## 2018-12-03 DIAGNOSIS — N186 End stage renal disease: Secondary | ICD-10-CM | POA: Diagnosis not present

## 2018-12-03 DIAGNOSIS — Z992 Dependence on renal dialysis: Secondary | ICD-10-CM | POA: Diagnosis not present

## 2018-12-03 DIAGNOSIS — D689 Coagulation defect, unspecified: Secondary | ICD-10-CM | POA: Diagnosis not present

## 2018-12-04 DIAGNOSIS — Z23 Encounter for immunization: Secondary | ICD-10-CM | POA: Diagnosis not present

## 2018-12-05 DIAGNOSIS — Z992 Dependence on renal dialysis: Secondary | ICD-10-CM | POA: Diagnosis not present

## 2018-12-05 DIAGNOSIS — R52 Pain, unspecified: Secondary | ICD-10-CM | POA: Diagnosis not present

## 2018-12-05 DIAGNOSIS — D631 Anemia in chronic kidney disease: Secondary | ICD-10-CM | POA: Diagnosis not present

## 2018-12-05 DIAGNOSIS — N2581 Secondary hyperparathyroidism of renal origin: Secondary | ICD-10-CM | POA: Diagnosis not present

## 2018-12-05 DIAGNOSIS — Z4901 Encounter for fitting and adjustment of extracorporeal dialysis catheter: Secondary | ICD-10-CM | POA: Diagnosis not present

## 2018-12-05 DIAGNOSIS — N186 End stage renal disease: Secondary | ICD-10-CM | POA: Diagnosis not present

## 2018-12-05 DIAGNOSIS — E1129 Type 2 diabetes mellitus with other diabetic kidney complication: Secondary | ICD-10-CM | POA: Diagnosis not present

## 2018-12-05 DIAGNOSIS — D689 Coagulation defect, unspecified: Secondary | ICD-10-CM | POA: Diagnosis not present

## 2018-12-08 DIAGNOSIS — R52 Pain, unspecified: Secondary | ICD-10-CM | POA: Diagnosis not present

## 2018-12-08 DIAGNOSIS — E1129 Type 2 diabetes mellitus with other diabetic kidney complication: Secondary | ICD-10-CM | POA: Diagnosis not present

## 2018-12-08 DIAGNOSIS — Z992 Dependence on renal dialysis: Secondary | ICD-10-CM | POA: Diagnosis not present

## 2018-12-08 DIAGNOSIS — D689 Coagulation defect, unspecified: Secondary | ICD-10-CM | POA: Diagnosis not present

## 2018-12-08 DIAGNOSIS — Z4901 Encounter for fitting and adjustment of extracorporeal dialysis catheter: Secondary | ICD-10-CM | POA: Diagnosis not present

## 2018-12-08 DIAGNOSIS — D631 Anemia in chronic kidney disease: Secondary | ICD-10-CM | POA: Diagnosis not present

## 2018-12-08 DIAGNOSIS — N2581 Secondary hyperparathyroidism of renal origin: Secondary | ICD-10-CM | POA: Diagnosis not present

## 2018-12-08 DIAGNOSIS — N186 End stage renal disease: Secondary | ICD-10-CM | POA: Diagnosis not present

## 2018-12-10 DIAGNOSIS — N2581 Secondary hyperparathyroidism of renal origin: Secondary | ICD-10-CM | POA: Diagnosis not present

## 2018-12-10 DIAGNOSIS — D631 Anemia in chronic kidney disease: Secondary | ICD-10-CM | POA: Diagnosis not present

## 2018-12-10 DIAGNOSIS — E1129 Type 2 diabetes mellitus with other diabetic kidney complication: Secondary | ICD-10-CM | POA: Diagnosis not present

## 2018-12-10 DIAGNOSIS — Z992 Dependence on renal dialysis: Secondary | ICD-10-CM | POA: Diagnosis not present

## 2018-12-10 DIAGNOSIS — D689 Coagulation defect, unspecified: Secondary | ICD-10-CM | POA: Diagnosis not present

## 2018-12-10 DIAGNOSIS — Z4901 Encounter for fitting and adjustment of extracorporeal dialysis catheter: Secondary | ICD-10-CM | POA: Diagnosis not present

## 2018-12-10 DIAGNOSIS — R52 Pain, unspecified: Secondary | ICD-10-CM | POA: Diagnosis not present

## 2018-12-10 DIAGNOSIS — N186 End stage renal disease: Secondary | ICD-10-CM | POA: Diagnosis not present

## 2018-12-12 DIAGNOSIS — D689 Coagulation defect, unspecified: Secondary | ICD-10-CM | POA: Diagnosis not present

## 2018-12-12 DIAGNOSIS — E1129 Type 2 diabetes mellitus with other diabetic kidney complication: Secondary | ICD-10-CM | POA: Diagnosis not present

## 2018-12-12 DIAGNOSIS — N2581 Secondary hyperparathyroidism of renal origin: Secondary | ICD-10-CM | POA: Diagnosis not present

## 2018-12-12 DIAGNOSIS — R52 Pain, unspecified: Secondary | ICD-10-CM | POA: Diagnosis not present

## 2018-12-12 DIAGNOSIS — N186 End stage renal disease: Secondary | ICD-10-CM | POA: Diagnosis not present

## 2018-12-12 DIAGNOSIS — Z4901 Encounter for fitting and adjustment of extracorporeal dialysis catheter: Secondary | ICD-10-CM | POA: Diagnosis not present

## 2018-12-12 DIAGNOSIS — D631 Anemia in chronic kidney disease: Secondary | ICD-10-CM | POA: Diagnosis not present

## 2018-12-12 DIAGNOSIS — Z992 Dependence on renal dialysis: Secondary | ICD-10-CM | POA: Diagnosis not present

## 2018-12-17 DIAGNOSIS — D689 Coagulation defect, unspecified: Secondary | ICD-10-CM | POA: Diagnosis not present

## 2018-12-17 DIAGNOSIS — R52 Pain, unspecified: Secondary | ICD-10-CM | POA: Diagnosis not present

## 2018-12-17 DIAGNOSIS — N2581 Secondary hyperparathyroidism of renal origin: Secondary | ICD-10-CM | POA: Diagnosis not present

## 2018-12-17 DIAGNOSIS — Z992 Dependence on renal dialysis: Secondary | ICD-10-CM | POA: Diagnosis not present

## 2018-12-17 DIAGNOSIS — N186 End stage renal disease: Secondary | ICD-10-CM | POA: Diagnosis not present

## 2018-12-17 DIAGNOSIS — E1129 Type 2 diabetes mellitus with other diabetic kidney complication: Secondary | ICD-10-CM | POA: Diagnosis not present

## 2018-12-17 DIAGNOSIS — D631 Anemia in chronic kidney disease: Secondary | ICD-10-CM | POA: Diagnosis not present

## 2018-12-17 DIAGNOSIS — Z4901 Encounter for fitting and adjustment of extracorporeal dialysis catheter: Secondary | ICD-10-CM | POA: Diagnosis not present

## 2018-12-19 DIAGNOSIS — Z4901 Encounter for fitting and adjustment of extracorporeal dialysis catheter: Secondary | ICD-10-CM | POA: Diagnosis not present

## 2018-12-19 DIAGNOSIS — R52 Pain, unspecified: Secondary | ICD-10-CM | POA: Diagnosis not present

## 2018-12-19 DIAGNOSIS — N186 End stage renal disease: Secondary | ICD-10-CM | POA: Diagnosis not present

## 2018-12-19 DIAGNOSIS — N2581 Secondary hyperparathyroidism of renal origin: Secondary | ICD-10-CM | POA: Diagnosis not present

## 2018-12-19 DIAGNOSIS — D689 Coagulation defect, unspecified: Secondary | ICD-10-CM | POA: Diagnosis not present

## 2018-12-19 DIAGNOSIS — E1129 Type 2 diabetes mellitus with other diabetic kidney complication: Secondary | ICD-10-CM | POA: Diagnosis not present

## 2018-12-19 DIAGNOSIS — D631 Anemia in chronic kidney disease: Secondary | ICD-10-CM | POA: Diagnosis not present

## 2018-12-19 DIAGNOSIS — Z992 Dependence on renal dialysis: Secondary | ICD-10-CM | POA: Diagnosis not present

## 2018-12-22 DIAGNOSIS — Z992 Dependence on renal dialysis: Secondary | ICD-10-CM | POA: Diagnosis not present

## 2018-12-22 DIAGNOSIS — E1129 Type 2 diabetes mellitus with other diabetic kidney complication: Secondary | ICD-10-CM | POA: Diagnosis not present

## 2018-12-22 DIAGNOSIS — R52 Pain, unspecified: Secondary | ICD-10-CM | POA: Diagnosis not present

## 2018-12-22 DIAGNOSIS — N2581 Secondary hyperparathyroidism of renal origin: Secondary | ICD-10-CM | POA: Diagnosis not present

## 2018-12-22 DIAGNOSIS — D689 Coagulation defect, unspecified: Secondary | ICD-10-CM | POA: Diagnosis not present

## 2018-12-22 DIAGNOSIS — D631 Anemia in chronic kidney disease: Secondary | ICD-10-CM | POA: Diagnosis not present

## 2018-12-22 DIAGNOSIS — Z4901 Encounter for fitting and adjustment of extracorporeal dialysis catheter: Secondary | ICD-10-CM | POA: Diagnosis not present

## 2018-12-22 DIAGNOSIS — N186 End stage renal disease: Secondary | ICD-10-CM | POA: Diagnosis not present

## 2018-12-23 DIAGNOSIS — N186 End stage renal disease: Secondary | ICD-10-CM | POA: Diagnosis not present

## 2018-12-23 DIAGNOSIS — E1122 Type 2 diabetes mellitus with diabetic chronic kidney disease: Secondary | ICD-10-CM | POA: Diagnosis not present

## 2018-12-23 DIAGNOSIS — Z992 Dependence on renal dialysis: Secondary | ICD-10-CM | POA: Diagnosis not present

## 2018-12-24 DIAGNOSIS — D509 Iron deficiency anemia, unspecified: Secondary | ICD-10-CM | POA: Diagnosis not present

## 2018-12-24 DIAGNOSIS — Z4901 Encounter for fitting and adjustment of extracorporeal dialysis catheter: Secondary | ICD-10-CM | POA: Diagnosis not present

## 2018-12-24 DIAGNOSIS — E1129 Type 2 diabetes mellitus with other diabetic kidney complication: Secondary | ICD-10-CM | POA: Diagnosis not present

## 2018-12-24 DIAGNOSIS — Z992 Dependence on renal dialysis: Secondary | ICD-10-CM | POA: Diagnosis not present

## 2018-12-24 DIAGNOSIS — D631 Anemia in chronic kidney disease: Secondary | ICD-10-CM | POA: Diagnosis not present

## 2018-12-24 DIAGNOSIS — N186 End stage renal disease: Secondary | ICD-10-CM | POA: Diagnosis not present

## 2018-12-24 DIAGNOSIS — E119 Type 2 diabetes mellitus without complications: Secondary | ICD-10-CM | POA: Diagnosis not present

## 2018-12-24 DIAGNOSIS — D689 Coagulation defect, unspecified: Secondary | ICD-10-CM | POA: Diagnosis not present

## 2018-12-24 DIAGNOSIS — R52 Pain, unspecified: Secondary | ICD-10-CM | POA: Diagnosis not present

## 2018-12-24 DIAGNOSIS — N2581 Secondary hyperparathyroidism of renal origin: Secondary | ICD-10-CM | POA: Diagnosis not present

## 2018-12-26 DIAGNOSIS — E1129 Type 2 diabetes mellitus with other diabetic kidney complication: Secondary | ICD-10-CM | POA: Diagnosis not present

## 2018-12-26 DIAGNOSIS — R52 Pain, unspecified: Secondary | ICD-10-CM | POA: Diagnosis not present

## 2018-12-26 DIAGNOSIS — Z992 Dependence on renal dialysis: Secondary | ICD-10-CM | POA: Diagnosis not present

## 2018-12-26 DIAGNOSIS — Z4901 Encounter for fitting and adjustment of extracorporeal dialysis catheter: Secondary | ICD-10-CM | POA: Diagnosis not present

## 2018-12-26 DIAGNOSIS — N2581 Secondary hyperparathyroidism of renal origin: Secondary | ICD-10-CM | POA: Diagnosis not present

## 2018-12-26 DIAGNOSIS — D689 Coagulation defect, unspecified: Secondary | ICD-10-CM | POA: Diagnosis not present

## 2018-12-26 DIAGNOSIS — D631 Anemia in chronic kidney disease: Secondary | ICD-10-CM | POA: Diagnosis not present

## 2018-12-26 DIAGNOSIS — N186 End stage renal disease: Secondary | ICD-10-CM | POA: Diagnosis not present

## 2018-12-26 DIAGNOSIS — D509 Iron deficiency anemia, unspecified: Secondary | ICD-10-CM | POA: Diagnosis not present

## 2018-12-29 DIAGNOSIS — D631 Anemia in chronic kidney disease: Secondary | ICD-10-CM | POA: Diagnosis not present

## 2018-12-29 DIAGNOSIS — N2581 Secondary hyperparathyroidism of renal origin: Secondary | ICD-10-CM | POA: Diagnosis not present

## 2018-12-29 DIAGNOSIS — Z4901 Encounter for fitting and adjustment of extracorporeal dialysis catheter: Secondary | ICD-10-CM | POA: Diagnosis not present

## 2018-12-29 DIAGNOSIS — D509 Iron deficiency anemia, unspecified: Secondary | ICD-10-CM | POA: Diagnosis not present

## 2018-12-29 DIAGNOSIS — E1129 Type 2 diabetes mellitus with other diabetic kidney complication: Secondary | ICD-10-CM | POA: Diagnosis not present

## 2018-12-29 DIAGNOSIS — R52 Pain, unspecified: Secondary | ICD-10-CM | POA: Diagnosis not present

## 2018-12-29 DIAGNOSIS — Z992 Dependence on renal dialysis: Secondary | ICD-10-CM | POA: Diagnosis not present

## 2018-12-29 DIAGNOSIS — D689 Coagulation defect, unspecified: Secondary | ICD-10-CM | POA: Diagnosis not present

## 2018-12-29 DIAGNOSIS — N186 End stage renal disease: Secondary | ICD-10-CM | POA: Diagnosis not present

## 2018-12-31 DIAGNOSIS — Z992 Dependence on renal dialysis: Secondary | ICD-10-CM | POA: Diagnosis not present

## 2018-12-31 DIAGNOSIS — D509 Iron deficiency anemia, unspecified: Secondary | ICD-10-CM | POA: Diagnosis not present

## 2018-12-31 DIAGNOSIS — D631 Anemia in chronic kidney disease: Secondary | ICD-10-CM | POA: Diagnosis not present

## 2018-12-31 DIAGNOSIS — E1129 Type 2 diabetes mellitus with other diabetic kidney complication: Secondary | ICD-10-CM | POA: Diagnosis not present

## 2018-12-31 DIAGNOSIS — R52 Pain, unspecified: Secondary | ICD-10-CM | POA: Diagnosis not present

## 2018-12-31 DIAGNOSIS — D689 Coagulation defect, unspecified: Secondary | ICD-10-CM | POA: Diagnosis not present

## 2018-12-31 DIAGNOSIS — Z4901 Encounter for fitting and adjustment of extracorporeal dialysis catheter: Secondary | ICD-10-CM | POA: Diagnosis not present

## 2018-12-31 DIAGNOSIS — N2581 Secondary hyperparathyroidism of renal origin: Secondary | ICD-10-CM | POA: Diagnosis not present

## 2018-12-31 DIAGNOSIS — N186 End stage renal disease: Secondary | ICD-10-CM | POA: Diagnosis not present

## 2019-01-02 DIAGNOSIS — E1129 Type 2 diabetes mellitus with other diabetic kidney complication: Secondary | ICD-10-CM | POA: Diagnosis not present

## 2019-01-02 DIAGNOSIS — D689 Coagulation defect, unspecified: Secondary | ICD-10-CM | POA: Diagnosis not present

## 2019-01-02 DIAGNOSIS — D509 Iron deficiency anemia, unspecified: Secondary | ICD-10-CM | POA: Diagnosis not present

## 2019-01-02 DIAGNOSIS — N186 End stage renal disease: Secondary | ICD-10-CM | POA: Diagnosis not present

## 2019-01-02 DIAGNOSIS — D631 Anemia in chronic kidney disease: Secondary | ICD-10-CM | POA: Diagnosis not present

## 2019-01-02 DIAGNOSIS — R52 Pain, unspecified: Secondary | ICD-10-CM | POA: Diagnosis not present

## 2019-01-02 DIAGNOSIS — Z992 Dependence on renal dialysis: Secondary | ICD-10-CM | POA: Diagnosis not present

## 2019-01-02 DIAGNOSIS — N2581 Secondary hyperparathyroidism of renal origin: Secondary | ICD-10-CM | POA: Diagnosis not present

## 2019-01-02 DIAGNOSIS — Z4901 Encounter for fitting and adjustment of extracorporeal dialysis catheter: Secondary | ICD-10-CM | POA: Diagnosis not present

## 2019-01-05 DIAGNOSIS — N186 End stage renal disease: Secondary | ICD-10-CM | POA: Diagnosis not present

## 2019-01-05 DIAGNOSIS — Z4901 Encounter for fitting and adjustment of extracorporeal dialysis catheter: Secondary | ICD-10-CM | POA: Diagnosis not present

## 2019-01-05 DIAGNOSIS — D631 Anemia in chronic kidney disease: Secondary | ICD-10-CM | POA: Diagnosis not present

## 2019-01-05 DIAGNOSIS — N2581 Secondary hyperparathyroidism of renal origin: Secondary | ICD-10-CM | POA: Diagnosis not present

## 2019-01-05 DIAGNOSIS — D689 Coagulation defect, unspecified: Secondary | ICD-10-CM | POA: Diagnosis not present

## 2019-01-05 DIAGNOSIS — E1129 Type 2 diabetes mellitus with other diabetic kidney complication: Secondary | ICD-10-CM | POA: Diagnosis not present

## 2019-01-05 DIAGNOSIS — R52 Pain, unspecified: Secondary | ICD-10-CM | POA: Diagnosis not present

## 2019-01-05 DIAGNOSIS — Z992 Dependence on renal dialysis: Secondary | ICD-10-CM | POA: Diagnosis not present

## 2019-01-05 DIAGNOSIS — D509 Iron deficiency anemia, unspecified: Secondary | ICD-10-CM | POA: Diagnosis not present

## 2019-01-07 DIAGNOSIS — R52 Pain, unspecified: Secondary | ICD-10-CM | POA: Diagnosis not present

## 2019-01-07 DIAGNOSIS — Z4901 Encounter for fitting and adjustment of extracorporeal dialysis catheter: Secondary | ICD-10-CM | POA: Diagnosis not present

## 2019-01-07 DIAGNOSIS — E1129 Type 2 diabetes mellitus with other diabetic kidney complication: Secondary | ICD-10-CM | POA: Diagnosis not present

## 2019-01-07 DIAGNOSIS — N186 End stage renal disease: Secondary | ICD-10-CM | POA: Diagnosis not present

## 2019-01-07 DIAGNOSIS — D509 Iron deficiency anemia, unspecified: Secondary | ICD-10-CM | POA: Diagnosis not present

## 2019-01-07 DIAGNOSIS — Z992 Dependence on renal dialysis: Secondary | ICD-10-CM | POA: Diagnosis not present

## 2019-01-07 DIAGNOSIS — N2581 Secondary hyperparathyroidism of renal origin: Secondary | ICD-10-CM | POA: Diagnosis not present

## 2019-01-07 DIAGNOSIS — D631 Anemia in chronic kidney disease: Secondary | ICD-10-CM | POA: Diagnosis not present

## 2019-01-07 DIAGNOSIS — D689 Coagulation defect, unspecified: Secondary | ICD-10-CM | POA: Diagnosis not present

## 2019-01-09 DIAGNOSIS — Z992 Dependence on renal dialysis: Secondary | ICD-10-CM | POA: Diagnosis not present

## 2019-01-09 DIAGNOSIS — R52 Pain, unspecified: Secondary | ICD-10-CM | POA: Diagnosis not present

## 2019-01-09 DIAGNOSIS — D631 Anemia in chronic kidney disease: Secondary | ICD-10-CM | POA: Diagnosis not present

## 2019-01-09 DIAGNOSIS — N2581 Secondary hyperparathyroidism of renal origin: Secondary | ICD-10-CM | POA: Diagnosis not present

## 2019-01-09 DIAGNOSIS — Z4901 Encounter for fitting and adjustment of extracorporeal dialysis catheter: Secondary | ICD-10-CM | POA: Diagnosis not present

## 2019-01-09 DIAGNOSIS — E1129 Type 2 diabetes mellitus with other diabetic kidney complication: Secondary | ICD-10-CM | POA: Diagnosis not present

## 2019-01-09 DIAGNOSIS — N186 End stage renal disease: Secondary | ICD-10-CM | POA: Diagnosis not present

## 2019-01-09 DIAGNOSIS — D689 Coagulation defect, unspecified: Secondary | ICD-10-CM | POA: Diagnosis not present

## 2019-01-09 DIAGNOSIS — D509 Iron deficiency anemia, unspecified: Secondary | ICD-10-CM | POA: Diagnosis not present

## 2019-01-12 DIAGNOSIS — D631 Anemia in chronic kidney disease: Secondary | ICD-10-CM | POA: Diagnosis not present

## 2019-01-12 DIAGNOSIS — D689 Coagulation defect, unspecified: Secondary | ICD-10-CM | POA: Diagnosis not present

## 2019-01-12 DIAGNOSIS — R52 Pain, unspecified: Secondary | ICD-10-CM | POA: Diagnosis not present

## 2019-01-12 DIAGNOSIS — N2581 Secondary hyperparathyroidism of renal origin: Secondary | ICD-10-CM | POA: Diagnosis not present

## 2019-01-12 DIAGNOSIS — E1129 Type 2 diabetes mellitus with other diabetic kidney complication: Secondary | ICD-10-CM | POA: Diagnosis not present

## 2019-01-12 DIAGNOSIS — Z4901 Encounter for fitting and adjustment of extracorporeal dialysis catheter: Secondary | ICD-10-CM | POA: Diagnosis not present

## 2019-01-12 DIAGNOSIS — Z992 Dependence on renal dialysis: Secondary | ICD-10-CM | POA: Diagnosis not present

## 2019-01-12 DIAGNOSIS — D509 Iron deficiency anemia, unspecified: Secondary | ICD-10-CM | POA: Diagnosis not present

## 2019-01-12 DIAGNOSIS — N186 End stage renal disease: Secondary | ICD-10-CM | POA: Diagnosis not present

## 2019-01-14 DIAGNOSIS — R52 Pain, unspecified: Secondary | ICD-10-CM | POA: Diagnosis not present

## 2019-01-14 DIAGNOSIS — N186 End stage renal disease: Secondary | ICD-10-CM | POA: Diagnosis not present

## 2019-01-14 DIAGNOSIS — D689 Coagulation defect, unspecified: Secondary | ICD-10-CM | POA: Diagnosis not present

## 2019-01-14 DIAGNOSIS — D631 Anemia in chronic kidney disease: Secondary | ICD-10-CM | POA: Diagnosis not present

## 2019-01-14 DIAGNOSIS — Z992 Dependence on renal dialysis: Secondary | ICD-10-CM | POA: Diagnosis not present

## 2019-01-14 DIAGNOSIS — E1129 Type 2 diabetes mellitus with other diabetic kidney complication: Secondary | ICD-10-CM | POA: Diagnosis not present

## 2019-01-14 DIAGNOSIS — N2581 Secondary hyperparathyroidism of renal origin: Secondary | ICD-10-CM | POA: Diagnosis not present

## 2019-01-14 DIAGNOSIS — D509 Iron deficiency anemia, unspecified: Secondary | ICD-10-CM | POA: Diagnosis not present

## 2019-01-14 DIAGNOSIS — Z4901 Encounter for fitting and adjustment of extracorporeal dialysis catheter: Secondary | ICD-10-CM | POA: Diagnosis not present

## 2019-01-16 DIAGNOSIS — D509 Iron deficiency anemia, unspecified: Secondary | ICD-10-CM | POA: Diagnosis not present

## 2019-01-16 DIAGNOSIS — D689 Coagulation defect, unspecified: Secondary | ICD-10-CM | POA: Diagnosis not present

## 2019-01-16 DIAGNOSIS — N2581 Secondary hyperparathyroidism of renal origin: Secondary | ICD-10-CM | POA: Diagnosis not present

## 2019-01-16 DIAGNOSIS — E1129 Type 2 diabetes mellitus with other diabetic kidney complication: Secondary | ICD-10-CM | POA: Diagnosis not present

## 2019-01-16 DIAGNOSIS — D631 Anemia in chronic kidney disease: Secondary | ICD-10-CM | POA: Diagnosis not present

## 2019-01-16 DIAGNOSIS — Z992 Dependence on renal dialysis: Secondary | ICD-10-CM | POA: Diagnosis not present

## 2019-01-16 DIAGNOSIS — N186 End stage renal disease: Secondary | ICD-10-CM | POA: Diagnosis not present

## 2019-01-16 DIAGNOSIS — R52 Pain, unspecified: Secondary | ICD-10-CM | POA: Diagnosis not present

## 2019-01-16 DIAGNOSIS — Z4901 Encounter for fitting and adjustment of extracorporeal dialysis catheter: Secondary | ICD-10-CM | POA: Diagnosis not present

## 2019-01-19 DIAGNOSIS — D689 Coagulation defect, unspecified: Secondary | ICD-10-CM | POA: Diagnosis not present

## 2019-01-19 DIAGNOSIS — E1129 Type 2 diabetes mellitus with other diabetic kidney complication: Secondary | ICD-10-CM | POA: Diagnosis not present

## 2019-01-19 DIAGNOSIS — Z4901 Encounter for fitting and adjustment of extracorporeal dialysis catheter: Secondary | ICD-10-CM | POA: Diagnosis not present

## 2019-01-19 DIAGNOSIS — R52 Pain, unspecified: Secondary | ICD-10-CM | POA: Diagnosis not present

## 2019-01-19 DIAGNOSIS — D509 Iron deficiency anemia, unspecified: Secondary | ICD-10-CM | POA: Diagnosis not present

## 2019-01-19 DIAGNOSIS — N2581 Secondary hyperparathyroidism of renal origin: Secondary | ICD-10-CM | POA: Diagnosis not present

## 2019-01-19 DIAGNOSIS — D631 Anemia in chronic kidney disease: Secondary | ICD-10-CM | POA: Diagnosis not present

## 2019-01-19 DIAGNOSIS — Z992 Dependence on renal dialysis: Secondary | ICD-10-CM | POA: Diagnosis not present

## 2019-01-19 DIAGNOSIS — N186 End stage renal disease: Secondary | ICD-10-CM | POA: Diagnosis not present

## 2019-01-21 DIAGNOSIS — D631 Anemia in chronic kidney disease: Secondary | ICD-10-CM | POA: Diagnosis not present

## 2019-01-21 DIAGNOSIS — E1129 Type 2 diabetes mellitus with other diabetic kidney complication: Secondary | ICD-10-CM | POA: Diagnosis not present

## 2019-01-21 DIAGNOSIS — D509 Iron deficiency anemia, unspecified: Secondary | ICD-10-CM | POA: Diagnosis not present

## 2019-01-21 DIAGNOSIS — N2581 Secondary hyperparathyroidism of renal origin: Secondary | ICD-10-CM | POA: Diagnosis not present

## 2019-01-21 DIAGNOSIS — Z992 Dependence on renal dialysis: Secondary | ICD-10-CM | POA: Diagnosis not present

## 2019-01-21 DIAGNOSIS — R52 Pain, unspecified: Secondary | ICD-10-CM | POA: Diagnosis not present

## 2019-01-21 DIAGNOSIS — N186 End stage renal disease: Secondary | ICD-10-CM | POA: Diagnosis not present

## 2019-01-21 DIAGNOSIS — D689 Coagulation defect, unspecified: Secondary | ICD-10-CM | POA: Diagnosis not present

## 2019-01-21 DIAGNOSIS — Z4901 Encounter for fitting and adjustment of extracorporeal dialysis catheter: Secondary | ICD-10-CM | POA: Diagnosis not present

## 2019-01-23 DIAGNOSIS — D631 Anemia in chronic kidney disease: Secondary | ICD-10-CM | POA: Diagnosis not present

## 2019-01-23 DIAGNOSIS — N186 End stage renal disease: Secondary | ICD-10-CM | POA: Diagnosis not present

## 2019-01-23 DIAGNOSIS — N2581 Secondary hyperparathyroidism of renal origin: Secondary | ICD-10-CM | POA: Diagnosis not present

## 2019-01-23 DIAGNOSIS — D689 Coagulation defect, unspecified: Secondary | ICD-10-CM | POA: Diagnosis not present

## 2019-01-23 DIAGNOSIS — E1122 Type 2 diabetes mellitus with diabetic chronic kidney disease: Secondary | ICD-10-CM | POA: Diagnosis not present

## 2019-01-23 DIAGNOSIS — R52 Pain, unspecified: Secondary | ICD-10-CM | POA: Diagnosis not present

## 2019-01-23 DIAGNOSIS — D509 Iron deficiency anemia, unspecified: Secondary | ICD-10-CM | POA: Diagnosis not present

## 2019-01-23 DIAGNOSIS — E1129 Type 2 diabetes mellitus with other diabetic kidney complication: Secondary | ICD-10-CM | POA: Diagnosis not present

## 2019-01-23 DIAGNOSIS — Z992 Dependence on renal dialysis: Secondary | ICD-10-CM | POA: Diagnosis not present

## 2019-01-23 DIAGNOSIS — Z4901 Encounter for fitting and adjustment of extracorporeal dialysis catheter: Secondary | ICD-10-CM | POA: Diagnosis not present

## 2019-01-28 DIAGNOSIS — R52 Pain, unspecified: Secondary | ICD-10-CM | POA: Diagnosis not present

## 2019-01-28 DIAGNOSIS — E1129 Type 2 diabetes mellitus with other diabetic kidney complication: Secondary | ICD-10-CM | POA: Diagnosis not present

## 2019-01-28 DIAGNOSIS — N2581 Secondary hyperparathyroidism of renal origin: Secondary | ICD-10-CM | POA: Diagnosis not present

## 2019-01-28 DIAGNOSIS — N186 End stage renal disease: Secondary | ICD-10-CM | POA: Diagnosis not present

## 2019-01-28 DIAGNOSIS — D689 Coagulation defect, unspecified: Secondary | ICD-10-CM | POA: Diagnosis not present

## 2019-01-28 DIAGNOSIS — Z992 Dependence on renal dialysis: Secondary | ICD-10-CM | POA: Diagnosis not present

## 2019-01-28 DIAGNOSIS — Z4901 Encounter for fitting and adjustment of extracorporeal dialysis catheter: Secondary | ICD-10-CM | POA: Diagnosis not present

## 2019-01-28 DIAGNOSIS — D631 Anemia in chronic kidney disease: Secondary | ICD-10-CM | POA: Diagnosis not present

## 2019-01-30 DIAGNOSIS — N2581 Secondary hyperparathyroidism of renal origin: Secondary | ICD-10-CM | POA: Diagnosis not present

## 2019-01-30 DIAGNOSIS — Z4901 Encounter for fitting and adjustment of extracorporeal dialysis catheter: Secondary | ICD-10-CM | POA: Diagnosis not present

## 2019-01-30 DIAGNOSIS — N186 End stage renal disease: Secondary | ICD-10-CM | POA: Diagnosis not present

## 2019-01-30 DIAGNOSIS — E1129 Type 2 diabetes mellitus with other diabetic kidney complication: Secondary | ICD-10-CM | POA: Diagnosis not present

## 2019-01-30 DIAGNOSIS — Z992 Dependence on renal dialysis: Secondary | ICD-10-CM | POA: Diagnosis not present

## 2019-01-30 DIAGNOSIS — D631 Anemia in chronic kidney disease: Secondary | ICD-10-CM | POA: Diagnosis not present

## 2019-01-30 DIAGNOSIS — R52 Pain, unspecified: Secondary | ICD-10-CM | POA: Diagnosis not present

## 2019-01-30 DIAGNOSIS — D689 Coagulation defect, unspecified: Secondary | ICD-10-CM | POA: Diagnosis not present

## 2019-02-02 DIAGNOSIS — E1165 Type 2 diabetes mellitus with hyperglycemia: Secondary | ICD-10-CM | POA: Diagnosis not present

## 2019-02-02 DIAGNOSIS — N184 Chronic kidney disease, stage 4 (severe): Secondary | ICD-10-CM | POA: Diagnosis not present

## 2019-02-02 DIAGNOSIS — N2581 Secondary hyperparathyroidism of renal origin: Secondary | ICD-10-CM | POA: Diagnosis not present

## 2019-02-02 DIAGNOSIS — Z992 Dependence on renal dialysis: Secondary | ICD-10-CM | POA: Diagnosis not present

## 2019-02-02 DIAGNOSIS — Z4901 Encounter for fitting and adjustment of extracorporeal dialysis catheter: Secondary | ICD-10-CM | POA: Diagnosis not present

## 2019-02-02 DIAGNOSIS — N186 End stage renal disease: Secondary | ICD-10-CM | POA: Diagnosis not present

## 2019-02-02 DIAGNOSIS — R52 Pain, unspecified: Secondary | ICD-10-CM | POA: Diagnosis not present

## 2019-02-02 DIAGNOSIS — D631 Anemia in chronic kidney disease: Secondary | ICD-10-CM | POA: Diagnosis not present

## 2019-02-02 DIAGNOSIS — E1021 Type 1 diabetes mellitus with diabetic nephropathy: Secondary | ICD-10-CM | POA: Diagnosis not present

## 2019-02-02 DIAGNOSIS — E1129 Type 2 diabetes mellitus with other diabetic kidney complication: Secondary | ICD-10-CM | POA: Diagnosis not present

## 2019-02-02 DIAGNOSIS — D689 Coagulation defect, unspecified: Secondary | ICD-10-CM | POA: Diagnosis not present

## 2019-02-04 DIAGNOSIS — R52 Pain, unspecified: Secondary | ICD-10-CM | POA: Diagnosis not present

## 2019-02-04 DIAGNOSIS — Z4901 Encounter for fitting and adjustment of extracorporeal dialysis catheter: Secondary | ICD-10-CM | POA: Diagnosis not present

## 2019-02-04 DIAGNOSIS — D631 Anemia in chronic kidney disease: Secondary | ICD-10-CM | POA: Diagnosis not present

## 2019-02-04 DIAGNOSIS — E1129 Type 2 diabetes mellitus with other diabetic kidney complication: Secondary | ICD-10-CM | POA: Diagnosis not present

## 2019-02-04 DIAGNOSIS — D689 Coagulation defect, unspecified: Secondary | ICD-10-CM | POA: Diagnosis not present

## 2019-02-04 DIAGNOSIS — Z992 Dependence on renal dialysis: Secondary | ICD-10-CM | POA: Diagnosis not present

## 2019-02-04 DIAGNOSIS — N2581 Secondary hyperparathyroidism of renal origin: Secondary | ICD-10-CM | POA: Diagnosis not present

## 2019-02-04 DIAGNOSIS — N186 End stage renal disease: Secondary | ICD-10-CM | POA: Diagnosis not present

## 2019-02-06 DIAGNOSIS — D631 Anemia in chronic kidney disease: Secondary | ICD-10-CM | POA: Diagnosis not present

## 2019-02-06 DIAGNOSIS — E1129 Type 2 diabetes mellitus with other diabetic kidney complication: Secondary | ICD-10-CM | POA: Diagnosis not present

## 2019-02-06 DIAGNOSIS — N186 End stage renal disease: Secondary | ICD-10-CM | POA: Diagnosis not present

## 2019-02-06 DIAGNOSIS — N2581 Secondary hyperparathyroidism of renal origin: Secondary | ICD-10-CM | POA: Diagnosis not present

## 2019-02-06 DIAGNOSIS — Z992 Dependence on renal dialysis: Secondary | ICD-10-CM | POA: Diagnosis not present

## 2019-02-06 DIAGNOSIS — Z4901 Encounter for fitting and adjustment of extracorporeal dialysis catheter: Secondary | ICD-10-CM | POA: Diagnosis not present

## 2019-02-06 DIAGNOSIS — R52 Pain, unspecified: Secondary | ICD-10-CM | POA: Diagnosis not present

## 2019-02-06 DIAGNOSIS — D689 Coagulation defect, unspecified: Secondary | ICD-10-CM | POA: Diagnosis not present

## 2019-02-08 DIAGNOSIS — Z452 Encounter for adjustment and management of vascular access device: Secondary | ICD-10-CM | POA: Diagnosis not present

## 2019-02-09 DIAGNOSIS — N186 End stage renal disease: Secondary | ICD-10-CM | POA: Diagnosis not present

## 2019-02-09 DIAGNOSIS — E1021 Type 1 diabetes mellitus with diabetic nephropathy: Secondary | ICD-10-CM | POA: Diagnosis not present

## 2019-02-09 DIAGNOSIS — E1142 Type 2 diabetes mellitus with diabetic polyneuropathy: Secondary | ICD-10-CM | POA: Diagnosis not present

## 2019-02-09 DIAGNOSIS — N2581 Secondary hyperparathyroidism of renal origin: Secondary | ICD-10-CM | POA: Diagnosis not present

## 2019-02-09 DIAGNOSIS — R52 Pain, unspecified: Secondary | ICD-10-CM | POA: Diagnosis not present

## 2019-02-09 DIAGNOSIS — I129 Hypertensive chronic kidney disease with stage 1 through stage 4 chronic kidney disease, or unspecified chronic kidney disease: Secondary | ICD-10-CM | POA: Diagnosis not present

## 2019-02-09 DIAGNOSIS — Z992 Dependence on renal dialysis: Secondary | ICD-10-CM | POA: Diagnosis not present

## 2019-02-09 DIAGNOSIS — E782 Mixed hyperlipidemia: Secondary | ICD-10-CM | POA: Diagnosis not present

## 2019-02-09 DIAGNOSIS — N184 Chronic kidney disease, stage 4 (severe): Secondary | ICD-10-CM | POA: Diagnosis not present

## 2019-02-09 DIAGNOSIS — Z4901 Encounter for fitting and adjustment of extracorporeal dialysis catheter: Secondary | ICD-10-CM | POA: Diagnosis not present

## 2019-02-09 DIAGNOSIS — E1129 Type 2 diabetes mellitus with other diabetic kidney complication: Secondary | ICD-10-CM | POA: Diagnosis not present

## 2019-02-09 DIAGNOSIS — D631 Anemia in chronic kidney disease: Secondary | ICD-10-CM | POA: Diagnosis not present

## 2019-02-09 DIAGNOSIS — D689 Coagulation defect, unspecified: Secondary | ICD-10-CM | POA: Diagnosis not present

## 2019-02-11 DIAGNOSIS — D689 Coagulation defect, unspecified: Secondary | ICD-10-CM | POA: Diagnosis not present

## 2019-02-11 DIAGNOSIS — Z992 Dependence on renal dialysis: Secondary | ICD-10-CM | POA: Diagnosis not present

## 2019-02-11 DIAGNOSIS — R52 Pain, unspecified: Secondary | ICD-10-CM | POA: Diagnosis not present

## 2019-02-11 DIAGNOSIS — E1129 Type 2 diabetes mellitus with other diabetic kidney complication: Secondary | ICD-10-CM | POA: Diagnosis not present

## 2019-02-11 DIAGNOSIS — N2581 Secondary hyperparathyroidism of renal origin: Secondary | ICD-10-CM | POA: Diagnosis not present

## 2019-02-11 DIAGNOSIS — Z4901 Encounter for fitting and adjustment of extracorporeal dialysis catheter: Secondary | ICD-10-CM | POA: Diagnosis not present

## 2019-02-11 DIAGNOSIS — N186 End stage renal disease: Secondary | ICD-10-CM | POA: Diagnosis not present

## 2019-02-11 DIAGNOSIS — D631 Anemia in chronic kidney disease: Secondary | ICD-10-CM | POA: Diagnosis not present

## 2019-02-13 DIAGNOSIS — E1129 Type 2 diabetes mellitus with other diabetic kidney complication: Secondary | ICD-10-CM | POA: Diagnosis not present

## 2019-02-13 DIAGNOSIS — N186 End stage renal disease: Secondary | ICD-10-CM | POA: Diagnosis not present

## 2019-02-13 DIAGNOSIS — Z992 Dependence on renal dialysis: Secondary | ICD-10-CM | POA: Diagnosis not present

## 2019-02-13 DIAGNOSIS — N2581 Secondary hyperparathyroidism of renal origin: Secondary | ICD-10-CM | POA: Diagnosis not present

## 2019-02-13 DIAGNOSIS — D631 Anemia in chronic kidney disease: Secondary | ICD-10-CM | POA: Diagnosis not present

## 2019-02-13 DIAGNOSIS — R52 Pain, unspecified: Secondary | ICD-10-CM | POA: Diagnosis not present

## 2019-02-13 DIAGNOSIS — Z4901 Encounter for fitting and adjustment of extracorporeal dialysis catheter: Secondary | ICD-10-CM | POA: Diagnosis not present

## 2019-02-13 DIAGNOSIS — D689 Coagulation defect, unspecified: Secondary | ICD-10-CM | POA: Diagnosis not present

## 2019-02-15 DIAGNOSIS — D689 Coagulation defect, unspecified: Secondary | ICD-10-CM | POA: Diagnosis not present

## 2019-02-15 DIAGNOSIS — E1129 Type 2 diabetes mellitus with other diabetic kidney complication: Secondary | ICD-10-CM | POA: Diagnosis not present

## 2019-02-15 DIAGNOSIS — R52 Pain, unspecified: Secondary | ICD-10-CM | POA: Diagnosis not present

## 2019-02-15 DIAGNOSIS — N2581 Secondary hyperparathyroidism of renal origin: Secondary | ICD-10-CM | POA: Diagnosis not present

## 2019-02-15 DIAGNOSIS — Z4901 Encounter for fitting and adjustment of extracorporeal dialysis catheter: Secondary | ICD-10-CM | POA: Diagnosis not present

## 2019-02-15 DIAGNOSIS — D631 Anemia in chronic kidney disease: Secondary | ICD-10-CM | POA: Diagnosis not present

## 2019-02-15 DIAGNOSIS — N186 End stage renal disease: Secondary | ICD-10-CM | POA: Diagnosis not present

## 2019-02-15 DIAGNOSIS — Z992 Dependence on renal dialysis: Secondary | ICD-10-CM | POA: Diagnosis not present

## 2019-02-17 DIAGNOSIS — D631 Anemia in chronic kidney disease: Secondary | ICD-10-CM | POA: Diagnosis not present

## 2019-02-17 DIAGNOSIS — N186 End stage renal disease: Secondary | ICD-10-CM | POA: Diagnosis not present

## 2019-02-17 DIAGNOSIS — E1129 Type 2 diabetes mellitus with other diabetic kidney complication: Secondary | ICD-10-CM | POA: Diagnosis not present

## 2019-02-17 DIAGNOSIS — D689 Coagulation defect, unspecified: Secondary | ICD-10-CM | POA: Diagnosis not present

## 2019-02-17 DIAGNOSIS — Z992 Dependence on renal dialysis: Secondary | ICD-10-CM | POA: Diagnosis not present

## 2019-02-17 DIAGNOSIS — N2581 Secondary hyperparathyroidism of renal origin: Secondary | ICD-10-CM | POA: Diagnosis not present

## 2019-02-17 DIAGNOSIS — R52 Pain, unspecified: Secondary | ICD-10-CM | POA: Diagnosis not present

## 2019-02-17 DIAGNOSIS — Z4901 Encounter for fitting and adjustment of extracorporeal dialysis catheter: Secondary | ICD-10-CM | POA: Diagnosis not present

## 2019-02-20 DIAGNOSIS — N186 End stage renal disease: Secondary | ICD-10-CM | POA: Diagnosis not present

## 2019-02-20 DIAGNOSIS — Z992 Dependence on renal dialysis: Secondary | ICD-10-CM | POA: Diagnosis not present

## 2019-02-20 DIAGNOSIS — D689 Coagulation defect, unspecified: Secondary | ICD-10-CM | POA: Diagnosis not present

## 2019-02-20 DIAGNOSIS — E1129 Type 2 diabetes mellitus with other diabetic kidney complication: Secondary | ICD-10-CM | POA: Diagnosis not present

## 2019-02-20 DIAGNOSIS — Z4901 Encounter for fitting and adjustment of extracorporeal dialysis catheter: Secondary | ICD-10-CM | POA: Diagnosis not present

## 2019-02-20 DIAGNOSIS — N2581 Secondary hyperparathyroidism of renal origin: Secondary | ICD-10-CM | POA: Diagnosis not present

## 2019-02-20 DIAGNOSIS — D631 Anemia in chronic kidney disease: Secondary | ICD-10-CM | POA: Diagnosis not present

## 2019-02-20 DIAGNOSIS — R52 Pain, unspecified: Secondary | ICD-10-CM | POA: Diagnosis not present

## 2019-02-22 DIAGNOSIS — Z992 Dependence on renal dialysis: Secondary | ICD-10-CM | POA: Diagnosis not present

## 2019-02-22 DIAGNOSIS — E1122 Type 2 diabetes mellitus with diabetic chronic kidney disease: Secondary | ICD-10-CM | POA: Diagnosis not present

## 2019-02-22 DIAGNOSIS — N186 End stage renal disease: Secondary | ICD-10-CM | POA: Diagnosis not present

## 2019-02-23 DIAGNOSIS — L299 Pruritus, unspecified: Secondary | ICD-10-CM | POA: Diagnosis not present

## 2019-02-23 DIAGNOSIS — Z992 Dependence on renal dialysis: Secondary | ICD-10-CM | POA: Diagnosis not present

## 2019-02-23 DIAGNOSIS — D689 Coagulation defect, unspecified: Secondary | ICD-10-CM | POA: Diagnosis not present

## 2019-02-23 DIAGNOSIS — N186 End stage renal disease: Secondary | ICD-10-CM | POA: Diagnosis not present

## 2019-02-23 DIAGNOSIS — N2581 Secondary hyperparathyroidism of renal origin: Secondary | ICD-10-CM | POA: Diagnosis not present

## 2019-02-23 DIAGNOSIS — D631 Anemia in chronic kidney disease: Secondary | ICD-10-CM | POA: Diagnosis not present

## 2019-02-23 DIAGNOSIS — R52 Pain, unspecified: Secondary | ICD-10-CM | POA: Diagnosis not present

## 2019-02-25 DIAGNOSIS — L299 Pruritus, unspecified: Secondary | ICD-10-CM | POA: Diagnosis not present

## 2019-02-25 DIAGNOSIS — D689 Coagulation defect, unspecified: Secondary | ICD-10-CM | POA: Diagnosis not present

## 2019-02-25 DIAGNOSIS — R52 Pain, unspecified: Secondary | ICD-10-CM | POA: Diagnosis not present

## 2019-02-25 DIAGNOSIS — D631 Anemia in chronic kidney disease: Secondary | ICD-10-CM | POA: Diagnosis not present

## 2019-02-25 DIAGNOSIS — Z992 Dependence on renal dialysis: Secondary | ICD-10-CM | POA: Diagnosis not present

## 2019-02-25 DIAGNOSIS — N186 End stage renal disease: Secondary | ICD-10-CM | POA: Diagnosis not present

## 2019-02-25 DIAGNOSIS — N2581 Secondary hyperparathyroidism of renal origin: Secondary | ICD-10-CM | POA: Diagnosis not present

## 2019-02-27 DIAGNOSIS — D689 Coagulation defect, unspecified: Secondary | ICD-10-CM | POA: Diagnosis not present

## 2019-02-27 DIAGNOSIS — Z992 Dependence on renal dialysis: Secondary | ICD-10-CM | POA: Diagnosis not present

## 2019-02-27 DIAGNOSIS — N2581 Secondary hyperparathyroidism of renal origin: Secondary | ICD-10-CM | POA: Diagnosis not present

## 2019-02-27 DIAGNOSIS — D631 Anemia in chronic kidney disease: Secondary | ICD-10-CM | POA: Diagnosis not present

## 2019-02-27 DIAGNOSIS — N186 End stage renal disease: Secondary | ICD-10-CM | POA: Diagnosis not present

## 2019-02-27 DIAGNOSIS — R52 Pain, unspecified: Secondary | ICD-10-CM | POA: Diagnosis not present

## 2019-02-27 DIAGNOSIS — L299 Pruritus, unspecified: Secondary | ICD-10-CM | POA: Diagnosis not present

## 2019-03-02 DIAGNOSIS — N186 End stage renal disease: Secondary | ICD-10-CM | POA: Diagnosis not present

## 2019-03-02 DIAGNOSIS — R52 Pain, unspecified: Secondary | ICD-10-CM | POA: Diagnosis not present

## 2019-03-02 DIAGNOSIS — D689 Coagulation defect, unspecified: Secondary | ICD-10-CM | POA: Diagnosis not present

## 2019-03-02 DIAGNOSIS — N2581 Secondary hyperparathyroidism of renal origin: Secondary | ICD-10-CM | POA: Diagnosis not present

## 2019-03-02 DIAGNOSIS — L299 Pruritus, unspecified: Secondary | ICD-10-CM | POA: Diagnosis not present

## 2019-03-02 DIAGNOSIS — D631 Anemia in chronic kidney disease: Secondary | ICD-10-CM | POA: Diagnosis not present

## 2019-03-02 DIAGNOSIS — Z992 Dependence on renal dialysis: Secondary | ICD-10-CM | POA: Diagnosis not present

## 2019-03-04 DIAGNOSIS — D631 Anemia in chronic kidney disease: Secondary | ICD-10-CM | POA: Diagnosis not present

## 2019-03-04 DIAGNOSIS — N186 End stage renal disease: Secondary | ICD-10-CM | POA: Diagnosis not present

## 2019-03-04 DIAGNOSIS — N2581 Secondary hyperparathyroidism of renal origin: Secondary | ICD-10-CM | POA: Diagnosis not present

## 2019-03-04 DIAGNOSIS — L299 Pruritus, unspecified: Secondary | ICD-10-CM | POA: Diagnosis not present

## 2019-03-04 DIAGNOSIS — D689 Coagulation defect, unspecified: Secondary | ICD-10-CM | POA: Diagnosis not present

## 2019-03-04 DIAGNOSIS — Z992 Dependence on renal dialysis: Secondary | ICD-10-CM | POA: Diagnosis not present

## 2019-03-04 DIAGNOSIS — R52 Pain, unspecified: Secondary | ICD-10-CM | POA: Diagnosis not present

## 2019-03-06 DIAGNOSIS — R52 Pain, unspecified: Secondary | ICD-10-CM | POA: Diagnosis not present

## 2019-03-06 DIAGNOSIS — D689 Coagulation defect, unspecified: Secondary | ICD-10-CM | POA: Diagnosis not present

## 2019-03-06 DIAGNOSIS — L299 Pruritus, unspecified: Secondary | ICD-10-CM | POA: Diagnosis not present

## 2019-03-06 DIAGNOSIS — N186 End stage renal disease: Secondary | ICD-10-CM | POA: Diagnosis not present

## 2019-03-06 DIAGNOSIS — N2581 Secondary hyperparathyroidism of renal origin: Secondary | ICD-10-CM | POA: Diagnosis not present

## 2019-03-06 DIAGNOSIS — D631 Anemia in chronic kidney disease: Secondary | ICD-10-CM | POA: Diagnosis not present

## 2019-03-06 DIAGNOSIS — Z992 Dependence on renal dialysis: Secondary | ICD-10-CM | POA: Diagnosis not present

## 2019-03-09 DIAGNOSIS — D689 Coagulation defect, unspecified: Secondary | ICD-10-CM | POA: Diagnosis not present

## 2019-03-09 DIAGNOSIS — N186 End stage renal disease: Secondary | ICD-10-CM | POA: Diagnosis not present

## 2019-03-09 DIAGNOSIS — R52 Pain, unspecified: Secondary | ICD-10-CM | POA: Diagnosis not present

## 2019-03-09 DIAGNOSIS — N2581 Secondary hyperparathyroidism of renal origin: Secondary | ICD-10-CM | POA: Diagnosis not present

## 2019-03-09 DIAGNOSIS — Z992 Dependence on renal dialysis: Secondary | ICD-10-CM | POA: Diagnosis not present

## 2019-03-09 DIAGNOSIS — D631 Anemia in chronic kidney disease: Secondary | ICD-10-CM | POA: Diagnosis not present

## 2019-03-09 DIAGNOSIS — L299 Pruritus, unspecified: Secondary | ICD-10-CM | POA: Diagnosis not present

## 2019-03-11 DIAGNOSIS — Z992 Dependence on renal dialysis: Secondary | ICD-10-CM | POA: Diagnosis not present

## 2019-03-11 DIAGNOSIS — D689 Coagulation defect, unspecified: Secondary | ICD-10-CM | POA: Diagnosis not present

## 2019-03-11 DIAGNOSIS — N2581 Secondary hyperparathyroidism of renal origin: Secondary | ICD-10-CM | POA: Diagnosis not present

## 2019-03-11 DIAGNOSIS — R52 Pain, unspecified: Secondary | ICD-10-CM | POA: Diagnosis not present

## 2019-03-11 DIAGNOSIS — L299 Pruritus, unspecified: Secondary | ICD-10-CM | POA: Diagnosis not present

## 2019-03-11 DIAGNOSIS — N186 End stage renal disease: Secondary | ICD-10-CM | POA: Diagnosis not present

## 2019-03-11 DIAGNOSIS — D631 Anemia in chronic kidney disease: Secondary | ICD-10-CM | POA: Diagnosis not present

## 2019-03-13 DIAGNOSIS — N186 End stage renal disease: Secondary | ICD-10-CM | POA: Diagnosis not present

## 2019-03-13 DIAGNOSIS — D631 Anemia in chronic kidney disease: Secondary | ICD-10-CM | POA: Diagnosis not present

## 2019-03-13 DIAGNOSIS — L299 Pruritus, unspecified: Secondary | ICD-10-CM | POA: Diagnosis not present

## 2019-03-13 DIAGNOSIS — Z992 Dependence on renal dialysis: Secondary | ICD-10-CM | POA: Diagnosis not present

## 2019-03-13 DIAGNOSIS — N2581 Secondary hyperparathyroidism of renal origin: Secondary | ICD-10-CM | POA: Diagnosis not present

## 2019-03-13 DIAGNOSIS — D689 Coagulation defect, unspecified: Secondary | ICD-10-CM | POA: Diagnosis not present

## 2019-03-13 DIAGNOSIS — R52 Pain, unspecified: Secondary | ICD-10-CM | POA: Diagnosis not present

## 2019-03-15 ENCOUNTER — Other Ambulatory Visit: Payer: Self-pay

## 2019-03-15 ENCOUNTER — Emergency Department (HOSPITAL_COMMUNITY)
Admission: EM | Admit: 2019-03-15 | Discharge: 2019-03-16 | Disposition: A | Discharge status: Home / Self Care | Payer: Medicare Other | Attending: Emergency Medicine | Admitting: Emergency Medicine

## 2019-03-15 ENCOUNTER — Encounter (HOSPITAL_COMMUNITY): Payer: Self-pay | Admitting: Emergency Medicine

## 2019-03-15 ENCOUNTER — Emergency Department (HOSPITAL_COMMUNITY): Payer: Medicare Other

## 2019-03-15 DIAGNOSIS — I120 Hypertensive chronic kidney disease with stage 5 chronic kidney disease or end stage renal disease: Secondary | ICD-10-CM | POA: Insufficient documentation

## 2019-03-15 DIAGNOSIS — R1013 Epigastric pain: Secondary | ICD-10-CM | POA: Insufficient documentation

## 2019-03-15 DIAGNOSIS — Z992 Dependence on renal dialysis: Secondary | ICD-10-CM | POA: Insufficient documentation

## 2019-03-15 DIAGNOSIS — Z20828 Contact with and (suspected) exposure to other viral communicable diseases: Secondary | ICD-10-CM | POA: Diagnosis not present

## 2019-03-15 DIAGNOSIS — Z79899 Other long term (current) drug therapy: Secondary | ICD-10-CM | POA: Diagnosis not present

## 2019-03-15 DIAGNOSIS — E1140 Type 2 diabetes mellitus with diabetic neuropathy, unspecified: Secondary | ICD-10-CM | POA: Insufficient documentation

## 2019-03-15 DIAGNOSIS — E8779 Other fluid overload: Secondary | ICD-10-CM

## 2019-03-15 DIAGNOSIS — R140 Abdominal distension (gaseous): Secondary | ICD-10-CM | POA: Diagnosis not present

## 2019-03-15 DIAGNOSIS — E1122 Type 2 diabetes mellitus with diabetic chronic kidney disease: Secondary | ICD-10-CM | POA: Insufficient documentation

## 2019-03-15 DIAGNOSIS — N186 End stage renal disease: Secondary | ICD-10-CM | POA: Diagnosis not present

## 2019-03-15 DIAGNOSIS — R079 Chest pain, unspecified: Secondary | ICD-10-CM | POA: Diagnosis not present

## 2019-03-15 DIAGNOSIS — R042 Hemoptysis: Secondary | ICD-10-CM | POA: Diagnosis present

## 2019-03-15 DIAGNOSIS — Z87891 Personal history of nicotine dependence: Secondary | ICD-10-CM | POA: Diagnosis not present

## 2019-03-15 DIAGNOSIS — J40 Bronchitis, not specified as acute or chronic: Secondary | ICD-10-CM | POA: Diagnosis not present

## 2019-03-15 DIAGNOSIS — R0789 Other chest pain: Secondary | ICD-10-CM | POA: Diagnosis not present

## 2019-03-15 DIAGNOSIS — R0602 Shortness of breath: Secondary | ICD-10-CM | POA: Diagnosis not present

## 2019-03-15 LAB — CBC
HCT: 34.3 % — ABNORMAL LOW (ref 39.0–52.0)
Hemoglobin: 11.2 g/dL — ABNORMAL LOW (ref 13.0–17.0)
MCH: 30.9 pg (ref 26.0–34.0)
MCHC: 32.7 g/dL (ref 30.0–36.0)
MCV: 94.5 fL (ref 80.0–100.0)
Platelets: 253 10*3/uL (ref 150–400)
RBC: 3.63 MIL/uL — ABNORMAL LOW (ref 4.22–5.81)
RDW: 14.7 % (ref 11.5–15.5)
WBC: 8.8 10*3/uL (ref 4.0–10.5)
nRBC: 0 % (ref 0.0–0.2)

## 2019-03-15 LAB — BASIC METABOLIC PANEL
Anion gap: 17 — ABNORMAL HIGH (ref 5–15)
BUN: 55 mg/dL — ABNORMAL HIGH (ref 8–23)
CO2: 22 mmol/L (ref 22–32)
Calcium: 9.5 mg/dL (ref 8.9–10.3)
Chloride: 100 mmol/L (ref 98–111)
Creatinine, Ser: 11.25 mg/dL — ABNORMAL HIGH (ref 0.61–1.24)
GFR calc Af Amer: 5 mL/min — ABNORMAL LOW (ref >60)
GFR calc non Af Amer: 4 mL/min — ABNORMAL LOW (ref >60)
Glucose, Bld: 121 mg/dL — ABNORMAL HIGH (ref 70–99)
Potassium: 5.1 mmol/L (ref 3.5–5.1)
Sodium: 139 mmol/L (ref 135–145)

## 2019-03-15 LAB — TROPONIN I (HIGH SENSITIVITY)
Troponin I (High Sensitivity): 22 ng/L — ABNORMAL HIGH (ref <18)
Troponin I (High Sensitivity): 20 ng/L — ABNORMAL HIGH (ref <18)

## 2019-03-15 NOTE — ED Triage Notes (Signed)
Patient c/o left sided chest pain and coughing up blood last night. Patient is dialysis patient and due for treatment tomorrow. States chest pain radiates into his throat.

## 2019-03-16 ENCOUNTER — Emergency Department (HOSPITAL_COMMUNITY): Payer: Medicare Other

## 2019-03-16 DIAGNOSIS — R140 Abdominal distension (gaseous): Secondary | ICD-10-CM | POA: Diagnosis not present

## 2019-03-16 DIAGNOSIS — R0789 Other chest pain: Secondary | ICD-10-CM | POA: Diagnosis not present

## 2019-03-16 LAB — URINALYSIS, ROUTINE W REFLEX MICROSCOPIC
Bacteria, UA: NONE SEEN
Bilirubin Urine: NEGATIVE
Glucose, UA: 150 mg/dL — AB
Hgb urine dipstick: NEGATIVE
Ketones, ur: NEGATIVE mg/dL
Leukocytes,Ua: NEGATIVE
Nitrite: NEGATIVE
Protein, ur: 100 mg/dL — AB
Specific Gravity, Urine: 1.013 (ref 1.005–1.030)
pH: 9 — ABNORMAL HIGH (ref 5.0–8.0)

## 2019-03-16 LAB — HEPATIC FUNCTION PANEL
ALT: 15 U/L (ref 0–44)
AST: 15 U/L (ref 15–41)
Albumin: 4.2 g/dL (ref 3.5–5.0)
Alkaline Phosphatase: 65 U/L (ref 38–126)
Bilirubin, Direct: <0.1 mg/dL (ref 0.0–0.2)
Total Bilirubin: 0.6 mg/dL (ref 0.3–1.2)
Total Protein: 8.3 g/dL — ABNORMAL HIGH (ref 6.5–8.1)

## 2019-03-16 LAB — POC SARS CORONAVIRUS 2 AG -  ED: SARS Coronavirus 2 Ag: NEGATIVE

## 2019-03-16 LAB — LIPASE, BLOOD: Lipase: 102 U/L — ABNORMAL HIGH (ref 11–51)

## 2019-03-16 MED ORDER — SODIUM ZIRCONIUM CYCLOSILICATE 10 G PO PACK
10.0000 g | PACK | Freq: Once | ORAL | Status: DC
  Filled 2019-03-16: qty 1

## 2019-03-16 MED ORDER — IOHEXOL 350 MG/ML SOLN
100.0000 mL | Freq: Once | INTRAVENOUS | Status: AC | PRN
  Administered 2019-03-16: 100 mL via INTRAVENOUS

## 2019-03-16 MED ORDER — HYDROMORPHONE HCL 1 MG/ML IJ SOLN
1.0000 mg | Freq: Once | INTRAMUSCULAR | Status: AC
  Administered 2019-03-16: 1 mg via INTRAVENOUS
  Filled 2019-03-16 (×2): qty 1

## 2019-03-16 MED ORDER — ONDANSETRON HCL 4 MG/2ML IJ SOLN
4.0000 mg | Freq: Once | INTRAMUSCULAR | Status: AC
  Administered 2019-03-16: 4 mg via INTRAVENOUS
  Filled 2019-03-16 (×2): qty 2

## 2019-03-16 MED ORDER — ALBUTEROL SULFATE HFA 108 (90 BASE) MCG/ACT IN AERS
INHALATION_SPRAY | RESPIRATORY_TRACT | Status: AC
  Filled 2019-03-16: qty 6.7

## 2019-03-16 MED ORDER — VANCOMYCIN HCL IN DEXTROSE 1-5 GM/200ML-% IV SOLN: 1000.0000 mg | INTRAVENOUS | Status: DC

## 2019-03-16 MED ORDER — SODIUM CHLORIDE 0.9 % IV SOLN: 2.0000 g | Freq: Once | INTRAVENOUS | Status: DC

## 2019-03-16 MED ORDER — ALBUTEROL SULFATE HFA 108 (90 BASE) MCG/ACT IN AERS: 8.0000 | INHALATION_SPRAY | Freq: Once | RESPIRATORY_TRACT | Status: DC

## 2019-03-16 MED ORDER — VANCOMYCIN HCL 2000 MG/400ML IV SOLN
2000.0000 mg | Freq: Once | INTRAVENOUS | Status: DC
  Filled 2019-03-16: qty 400

## 2019-03-16 MED ORDER — SODIUM CHLORIDE 0.9 % IV SOLN
1.0000 g | INTRAVENOUS | Status: DC
  Filled 2019-03-16: qty 1
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
## 2019-03-16 NOTE — ED Notes (Signed)
Attempted IV stick x 2  

## 2019-03-16 NOTE — ED Notes (Signed)
Patient verbalizes understanding of discharge instructions. Opportunity for questioning and answers were provided. Armband removed by staff, pt discharged from ED. Ambulated out to lobby  

## 2019-03-16 NOTE — Discharge Instructions (Addendum)
Thank you for allowing me to care for you today in the Emergency Department.   You were seen today for coughing up blood, sore throat, and chest pain.  Your work-up regarding your heart was reassuring.  Your CT scan was concerning for bronchitis.  Take 2 puffs of the albuterol inhaler with a spacer every 4 hours as needed for shortness of breath.  Coricidin is available over-the-counter and can help with sore throat and cough and cold symptoms.  Your work-up did not show any other acute findings today.  Since you were due for dialysis, I would recommend getting rescheduled as soon as possible.  You may receive a call from the kidney navigator today to help you set this up sooner than Thursday.  You were given an dose of medicine called Lokelma in the ER today to make sure that your potassium does not elevate.  Your COVID-19 test today was negative.  You should return to the ER if you develop worsening shortness of breath, chest pain, uncontrollable vomiting, high fevers, or other new, concerning symptoms.

## 2019-03-16 NOTE — Progress Notes (Signed)
Pharmacy Antibiotic Note  Jonathan Mcneil is a 63 y.o. male admitted on 03/15/2019 with sepsis.  Pharmacy has been consulted for Vancomycin/Cefepime dosing. WBC WNL.  ESRD on HD TTS.   Plan: Vancomycin 2000 mg IV x 1, then 1000 mg IV qHD TTS Cefepime 1g IV q24h Trend WBC, temp, HD schedule F/U infectious work-up Drug levels as indicated  Temp (24hrs), Avg:98.4 F (36.9 C), Min:98 F (36.7 C), Max:98.7 F (37.1 C)  Recent Labs  Lab 03/15/19 1917 03/15/19 1950  WBC  --  8.8  CREATININE 11.25*  --     CrCl cannot be calculated (Unknown ideal weight.).    Allergies  Allergen Reactions  . Ibuprofen Other (See Comments)    UNSPECIFIED REACTION  Pt states he was told not to take it.  Marland Kitchen Penicillins Itching and Other (See Comments)    Has patient had a PCN reaction causing immediate rash, facial/tongue/throat swelling, SOB or lightheadedness with hypotension: No Has patient had a PCN reaction causing severe rash involving mucus membranes or skin necrosis: No Has patient had a PCN reaction that required hospitalization No Has patient had a PCN reaction occurring within the last 10 years: Unknown If all of the above answers are "NO", then may proceed with Cephalosporin use.    Narda Bonds, PharmD, BCPS Clinical Pharmacist Phone: [phone]

## 2019-03-18 DIAGNOSIS — N2581 Secondary hyperparathyroidism of renal origin: Secondary | ICD-10-CM | POA: Diagnosis not present

## 2019-03-18 DIAGNOSIS — Z992 Dependence on renal dialysis: Secondary | ICD-10-CM | POA: Diagnosis not present

## 2019-03-18 DIAGNOSIS — D689 Coagulation defect, unspecified: Secondary | ICD-10-CM | POA: Diagnosis not present

## 2019-03-18 DIAGNOSIS — D631 Anemia in chronic kidney disease: Secondary | ICD-10-CM | POA: Diagnosis not present

## 2019-03-18 DIAGNOSIS — L299 Pruritus, unspecified: Secondary | ICD-10-CM | POA: Diagnosis not present

## 2019-03-18 DIAGNOSIS — N186 End stage renal disease: Secondary | ICD-10-CM | POA: Diagnosis not present

## 2019-03-18 DIAGNOSIS — R52 Pain, unspecified: Secondary | ICD-10-CM | POA: Diagnosis not present

## 2019-03-20 ENCOUNTER — Emergency Department (HOSPITAL_COMMUNITY)
Admission: EM | Admit: 2019-03-20 | Discharge: 2019-03-21 | Disposition: A | Discharge status: Home / Self Care | Payer: Medicare Other | Attending: Emergency Medicine | Admitting: Emergency Medicine

## 2019-03-20 ENCOUNTER — Other Ambulatory Visit: Payer: Self-pay

## 2019-03-20 DIAGNOSIS — I120 Hypertensive chronic kidney disease with stage 5 chronic kidney disease or end stage renal disease: Secondary | ICD-10-CM | POA: Insufficient documentation

## 2019-03-20 DIAGNOSIS — Z20828 Contact with and (suspected) exposure to other viral communicable diseases: Secondary | ICD-10-CM | POA: Insufficient documentation

## 2019-03-20 DIAGNOSIS — R0602 Shortness of breath: Secondary | ICD-10-CM | POA: Insufficient documentation

## 2019-03-20 DIAGNOSIS — Z79899 Other long term (current) drug therapy: Secondary | ICD-10-CM | POA: Insufficient documentation

## 2019-03-20 DIAGNOSIS — N186 End stage renal disease: Secondary | ICD-10-CM | POA: Insufficient documentation

## 2019-03-20 DIAGNOSIS — R109 Unspecified abdominal pain: Secondary | ICD-10-CM | POA: Diagnosis not present

## 2019-03-20 DIAGNOSIS — R1084 Generalized abdominal pain: Secondary | ICD-10-CM | POA: Diagnosis not present

## 2019-03-20 DIAGNOSIS — Z87891 Personal history of nicotine dependence: Secondary | ICD-10-CM | POA: Insufficient documentation

## 2019-03-20 DIAGNOSIS — Z992 Dependence on renal dialysis: Secondary | ICD-10-CM | POA: Diagnosis not present

## 2019-03-20 DIAGNOSIS — E1122 Type 2 diabetes mellitus with diabetic chronic kidney disease: Secondary | ICD-10-CM | POA: Diagnosis not present

## 2019-03-20 MED ORDER — SODIUM CHLORIDE 0.9% FLUSH
3.0000 mL | Freq: Once | INTRAVENOUS | Status: AC
  Administered 2019-03-21: 3 mL via INTRAVENOUS

## 2019-03-20 NOTE — ED Triage Notes (Addendum)
Per pt he said he started having SOB that started today after he ate. Pt said his stomach is hurting. No chest pain, no chills, no fevers. Dialysis m,wed, fri

## 2019-03-21 ENCOUNTER — Emergency Department (HOSPITAL_COMMUNITY): Payer: Medicare Other

## 2019-03-21 DIAGNOSIS — R0602 Shortness of breath: Secondary | ICD-10-CM | POA: Diagnosis not present

## 2019-03-21 DIAGNOSIS — R109 Unspecified abdominal pain: Secondary | ICD-10-CM | POA: Diagnosis not present

## 2019-03-21 LAB — BRAIN NATRIURETIC PEPTIDE: B Natriuretic Peptide: 527.4 pg/mL — ABNORMAL HIGH (ref 0.0–100.0)

## 2019-03-21 LAB — CBC
HCT: 31 % — ABNORMAL LOW (ref 39.0–52.0)
Hemoglobin: 11 g/dL — ABNORMAL LOW (ref 13.0–17.0)
MCH: 33.4 pg (ref 26.0–34.0)
MCHC: 35.5 g/dL (ref 30.0–36.0)
MCV: 94.2 fL (ref 80.0–100.0)
Platelets: 272 K/uL (ref 150–400)
RBC: 3.29 MIL/uL — ABNORMAL LOW (ref 4.22–5.81)
RDW: 15 % (ref 11.5–15.5)
WBC: 6.7 K/uL (ref 4.0–10.5)
nRBC: 0 % (ref 0.0–0.2)

## 2019-03-21 LAB — COMPREHENSIVE METABOLIC PANEL
ALT: 14 U/L (ref 0–44)
AST: 15 U/L (ref 15–41)
Albumin: 4.4 g/dL (ref 3.5–5.0)
Alkaline Phosphatase: 72 U/L (ref 38–126)
Anion gap: 23 — ABNORMAL HIGH (ref 5–15)
BUN: 66 mg/dL — ABNORMAL HIGH (ref 8–23)
CO2: 19 mmol/L — ABNORMAL LOW (ref 22–32)
Calcium: 8.3 mg/dL — ABNORMAL LOW (ref 8.9–10.3)
Chloride: 97 mmol/L — ABNORMAL LOW (ref 98–111)
Creatinine, Ser: 14.81 mg/dL — ABNORMAL HIGH (ref 0.61–1.24)
GFR calc Af Amer: 4 mL/min — ABNORMAL LOW (ref >60)
GFR calc non Af Amer: 3 mL/min — ABNORMAL LOW (ref >60)
Glucose, Bld: 126 mg/dL — ABNORMAL HIGH (ref 70–99)
Potassium: 4.4 mmol/L (ref 3.5–5.1)
Sodium: 139 mmol/L (ref 135–145)
Total Bilirubin: 0.4 mg/dL (ref 0.3–1.2)
Total Protein: 8.3 g/dL — ABNORMAL HIGH (ref 6.5–8.1)

## 2019-03-21 LAB — LIPASE, BLOOD: Lipase: 45 U/L (ref 11–51)

## 2019-03-21 LAB — POC SARS CORONAVIRUS 2 AG -  ED: SARS Coronavirus 2 Ag: NEGATIVE

## 2019-03-21 MED ORDER — SODIUM CHLORIDE 0.9 % IV BOLUS
500.0000 mL | Freq: Once | INTRAVENOUS | Status: AC
  Administered 2019-03-21: 500 mL via INTRAVENOUS

## 2019-03-21 MED ORDER — MORPHINE SULFATE (PF) 4 MG/ML IV SOLN
4.0000 mg | Freq: Once | INTRAVENOUS | Status: AC
  Administered 2019-03-21: 4 mg via INTRAVENOUS
  Filled 2019-03-21: qty 1

## 2019-03-21 MED ORDER — ONDANSETRON HCL 4 MG/2ML IJ SOLN
4.0000 mg | Freq: Once | INTRAMUSCULAR | Status: AC
  Administered 2019-03-21: 4 mg via INTRAVENOUS
  Filled 2019-03-21: qty 2

## 2019-03-21 MED ORDER — IOHEXOL 300 MG/ML  SOLN
100.0000 mL | Freq: Once | INTRAMUSCULAR | Status: AC | PRN
  Administered 2019-03-21: 100 mL via INTRAVENOUS

## 2019-03-21 NOTE — ED Notes (Signed)
Pt transported to CT ?

## 2019-03-21 NOTE — Discharge Instructions (Addendum)
Make sure you go to your dialysis appointment on Tuesday.  Return the emergency department for any difficulty breathing, abdominal pain, chest pain or any other worsening or concerning symptoms.
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
## 2019-03-21 NOTE — ED Notes (Signed)
IV attempted without success. 

## 2019-03-23 DIAGNOSIS — N2581 Secondary hyperparathyroidism of renal origin: Secondary | ICD-10-CM | POA: Diagnosis not present

## 2019-03-23 DIAGNOSIS — N186 End stage renal disease: Secondary | ICD-10-CM | POA: Diagnosis not present

## 2019-03-23 DIAGNOSIS — D689 Coagulation defect, unspecified: Secondary | ICD-10-CM | POA: Diagnosis not present

## 2019-03-23 DIAGNOSIS — R52 Pain, unspecified: Secondary | ICD-10-CM | POA: Diagnosis not present

## 2019-03-23 DIAGNOSIS — Z992 Dependence on renal dialysis: Secondary | ICD-10-CM | POA: Diagnosis not present

## 2019-03-23 DIAGNOSIS — D631 Anemia in chronic kidney disease: Secondary | ICD-10-CM | POA: Diagnosis not present

## 2019-03-23 DIAGNOSIS — L299 Pruritus, unspecified: Secondary | ICD-10-CM | POA: Diagnosis not present

## 2019-03-25 DIAGNOSIS — E1122 Type 2 diabetes mellitus with diabetic chronic kidney disease: Secondary | ICD-10-CM | POA: Diagnosis not present

## 2019-03-25 DIAGNOSIS — N2581 Secondary hyperparathyroidism of renal origin: Secondary | ICD-10-CM | POA: Diagnosis not present

## 2019-03-25 DIAGNOSIS — D689 Coagulation defect, unspecified: Secondary | ICD-10-CM | POA: Diagnosis not present

## 2019-03-25 DIAGNOSIS — N186 End stage renal disease: Secondary | ICD-10-CM | POA: Diagnosis not present

## 2019-03-25 DIAGNOSIS — Z992 Dependence on renal dialysis: Secondary | ICD-10-CM | POA: Diagnosis not present

## 2019-03-25 DIAGNOSIS — R52 Pain, unspecified: Secondary | ICD-10-CM | POA: Diagnosis not present

## 2019-03-25 DIAGNOSIS — L299 Pruritus, unspecified: Secondary | ICD-10-CM | POA: Diagnosis not present

## 2019-03-25 DIAGNOSIS — D631 Anemia in chronic kidney disease: Secondary | ICD-10-CM | POA: Diagnosis not present

## 2019-03-28 DIAGNOSIS — N2581 Secondary hyperparathyroidism of renal origin: Secondary | ICD-10-CM | POA: Diagnosis not present

## 2019-03-28 DIAGNOSIS — D631 Anemia in chronic kidney disease: Secondary | ICD-10-CM | POA: Diagnosis not present

## 2019-03-28 DIAGNOSIS — D689 Coagulation defect, unspecified: Secondary | ICD-10-CM | POA: Diagnosis not present

## 2019-03-28 DIAGNOSIS — Z992 Dependence on renal dialysis: Secondary | ICD-10-CM | POA: Diagnosis not present

## 2019-03-28 DIAGNOSIS — N186 End stage renal disease: Secondary | ICD-10-CM | POA: Diagnosis not present

## 2019-03-28 DIAGNOSIS — E1129 Type 2 diabetes mellitus with other diabetic kidney complication: Secondary | ICD-10-CM | POA: Diagnosis not present

## 2019-03-28 DIAGNOSIS — R52 Pain, unspecified: Secondary | ICD-10-CM | POA: Diagnosis not present

## 2019-03-30 DIAGNOSIS — D689 Coagulation defect, unspecified: Secondary | ICD-10-CM | POA: Diagnosis not present

## 2019-03-30 DIAGNOSIS — E1129 Type 2 diabetes mellitus with other diabetic kidney complication: Secondary | ICD-10-CM | POA: Diagnosis not present

## 2019-03-30 DIAGNOSIS — N186 End stage renal disease: Secondary | ICD-10-CM | POA: Diagnosis not present

## 2019-03-30 DIAGNOSIS — D631 Anemia in chronic kidney disease: Secondary | ICD-10-CM | POA: Diagnosis not present

## 2019-03-30 DIAGNOSIS — R52 Pain, unspecified: Secondary | ICD-10-CM | POA: Diagnosis not present

## 2019-03-30 DIAGNOSIS — N2581 Secondary hyperparathyroidism of renal origin: Secondary | ICD-10-CM | POA: Diagnosis not present

## 2019-03-30 DIAGNOSIS — Z992 Dependence on renal dialysis: Secondary | ICD-10-CM | POA: Diagnosis not present

## 2019-04-01 DIAGNOSIS — E1129 Type 2 diabetes mellitus with other diabetic kidney complication: Secondary | ICD-10-CM | POA: Diagnosis not present

## 2019-04-01 DIAGNOSIS — R52 Pain, unspecified: Secondary | ICD-10-CM | POA: Diagnosis not present

## 2019-04-01 DIAGNOSIS — D689 Coagulation defect, unspecified: Secondary | ICD-10-CM | POA: Diagnosis not present

## 2019-04-01 DIAGNOSIS — N186 End stage renal disease: Secondary | ICD-10-CM | POA: Diagnosis not present

## 2019-04-01 DIAGNOSIS — Z992 Dependence on renal dialysis: Secondary | ICD-10-CM | POA: Diagnosis not present

## 2019-04-01 DIAGNOSIS — D631 Anemia in chronic kidney disease: Secondary | ICD-10-CM | POA: Diagnosis not present

## 2019-04-01 DIAGNOSIS — N2581 Secondary hyperparathyroidism of renal origin: Secondary | ICD-10-CM | POA: Diagnosis not present

## 2019-04-03 DIAGNOSIS — N2581 Secondary hyperparathyroidism of renal origin: Secondary | ICD-10-CM | POA: Diagnosis not present

## 2019-04-03 DIAGNOSIS — N186 End stage renal disease: Secondary | ICD-10-CM | POA: Diagnosis not present

## 2019-04-03 DIAGNOSIS — D689 Coagulation defect, unspecified: Secondary | ICD-10-CM | POA: Diagnosis not present

## 2019-04-03 DIAGNOSIS — Z992 Dependence on renal dialysis: Secondary | ICD-10-CM | POA: Diagnosis not present

## 2019-04-03 DIAGNOSIS — R52 Pain, unspecified: Secondary | ICD-10-CM | POA: Diagnosis not present

## 2019-04-03 DIAGNOSIS — E1129 Type 2 diabetes mellitus with other diabetic kidney complication: Secondary | ICD-10-CM | POA: Diagnosis not present

## 2019-04-03 DIAGNOSIS — D631 Anemia in chronic kidney disease: Secondary | ICD-10-CM | POA: Diagnosis not present

## 2019-04-06 DIAGNOSIS — Z992 Dependence on renal dialysis: Secondary | ICD-10-CM | POA: Diagnosis not present

## 2019-04-06 DIAGNOSIS — R52 Pain, unspecified: Secondary | ICD-10-CM | POA: Diagnosis not present

## 2019-04-06 DIAGNOSIS — D631 Anemia in chronic kidney disease: Secondary | ICD-10-CM | POA: Diagnosis not present

## 2019-04-06 DIAGNOSIS — N186 End stage renal disease: Secondary | ICD-10-CM | POA: Diagnosis not present

## 2019-04-06 DIAGNOSIS — E1129 Type 2 diabetes mellitus with other diabetic kidney complication: Secondary | ICD-10-CM | POA: Diagnosis not present

## 2019-04-06 DIAGNOSIS — N2581 Secondary hyperparathyroidism of renal origin: Secondary | ICD-10-CM | POA: Diagnosis not present

## 2019-04-06 DIAGNOSIS — D689 Coagulation defect, unspecified: Secondary | ICD-10-CM | POA: Diagnosis not present

## 2019-04-08 DIAGNOSIS — N2581 Secondary hyperparathyroidism of renal origin: Secondary | ICD-10-CM | POA: Diagnosis not present

## 2019-04-08 DIAGNOSIS — D631 Anemia in chronic kidney disease: Secondary | ICD-10-CM | POA: Diagnosis not present

## 2019-04-08 DIAGNOSIS — Z992 Dependence on renal dialysis: Secondary | ICD-10-CM | POA: Diagnosis not present

## 2019-04-08 DIAGNOSIS — D689 Coagulation defect, unspecified: Secondary | ICD-10-CM | POA: Diagnosis not present

## 2019-04-08 DIAGNOSIS — R52 Pain, unspecified: Secondary | ICD-10-CM | POA: Diagnosis not present

## 2019-04-08 DIAGNOSIS — N186 End stage renal disease: Secondary | ICD-10-CM | POA: Diagnosis not present

## 2019-04-08 DIAGNOSIS — E1129 Type 2 diabetes mellitus with other diabetic kidney complication: Secondary | ICD-10-CM | POA: Diagnosis not present

## 2019-04-10 DIAGNOSIS — N186 End stage renal disease: Secondary | ICD-10-CM | POA: Diagnosis not present

## 2019-04-10 DIAGNOSIS — N2581 Secondary hyperparathyroidism of renal origin: Secondary | ICD-10-CM | POA: Diagnosis not present

## 2019-04-10 DIAGNOSIS — D689 Coagulation defect, unspecified: Secondary | ICD-10-CM | POA: Diagnosis not present

## 2019-04-10 DIAGNOSIS — Z992 Dependence on renal dialysis: Secondary | ICD-10-CM | POA: Diagnosis not present

## 2019-04-10 DIAGNOSIS — E1129 Type 2 diabetes mellitus with other diabetic kidney complication: Secondary | ICD-10-CM | POA: Diagnosis not present

## 2019-04-10 DIAGNOSIS — D631 Anemia in chronic kidney disease: Secondary | ICD-10-CM | POA: Diagnosis not present

## 2019-04-10 DIAGNOSIS — R52 Pain, unspecified: Secondary | ICD-10-CM | POA: Diagnosis not present

## 2019-04-13 DIAGNOSIS — Z992 Dependence on renal dialysis: Secondary | ICD-10-CM | POA: Diagnosis not present

## 2019-04-13 DIAGNOSIS — N2581 Secondary hyperparathyroidism of renal origin: Secondary | ICD-10-CM | POA: Diagnosis not present

## 2019-04-13 DIAGNOSIS — D689 Coagulation defect, unspecified: Secondary | ICD-10-CM | POA: Diagnosis not present

## 2019-04-13 DIAGNOSIS — D631 Anemia in chronic kidney disease: Secondary | ICD-10-CM | POA: Diagnosis not present

## 2019-04-13 DIAGNOSIS — E1129 Type 2 diabetes mellitus with other diabetic kidney complication: Secondary | ICD-10-CM | POA: Diagnosis not present

## 2019-04-13 DIAGNOSIS — N186 End stage renal disease: Secondary | ICD-10-CM | POA: Diagnosis not present

## 2019-04-13 DIAGNOSIS — R52 Pain, unspecified: Secondary | ICD-10-CM | POA: Diagnosis not present

## 2019-04-15 DIAGNOSIS — R52 Pain, unspecified: Secondary | ICD-10-CM | POA: Diagnosis not present

## 2019-04-15 DIAGNOSIS — D631 Anemia in chronic kidney disease: Secondary | ICD-10-CM | POA: Diagnosis not present

## 2019-04-15 DIAGNOSIS — E1129 Type 2 diabetes mellitus with other diabetic kidney complication: Secondary | ICD-10-CM | POA: Diagnosis not present

## 2019-04-15 DIAGNOSIS — N186 End stage renal disease: Secondary | ICD-10-CM | POA: Diagnosis not present

## 2019-04-15 DIAGNOSIS — N2581 Secondary hyperparathyroidism of renal origin: Secondary | ICD-10-CM | POA: Diagnosis not present

## 2019-04-15 DIAGNOSIS — D689 Coagulation defect, unspecified: Secondary | ICD-10-CM | POA: Diagnosis not present

## 2019-04-15 DIAGNOSIS — Z992 Dependence on renal dialysis: Secondary | ICD-10-CM | POA: Diagnosis not present

## 2019-04-17 DIAGNOSIS — Z992 Dependence on renal dialysis: Secondary | ICD-10-CM | POA: Diagnosis not present

## 2019-04-17 DIAGNOSIS — E1129 Type 2 diabetes mellitus with other diabetic kidney complication: Secondary | ICD-10-CM | POA: Diagnosis not present

## 2019-04-17 DIAGNOSIS — D631 Anemia in chronic kidney disease: Secondary | ICD-10-CM | POA: Diagnosis not present

## 2019-04-17 DIAGNOSIS — R52 Pain, unspecified: Secondary | ICD-10-CM | POA: Diagnosis not present

## 2019-04-17 DIAGNOSIS — N186 End stage renal disease: Secondary | ICD-10-CM | POA: Diagnosis not present

## 2019-04-17 DIAGNOSIS — D689 Coagulation defect, unspecified: Secondary | ICD-10-CM | POA: Diagnosis not present

## 2019-04-17 DIAGNOSIS — N2581 Secondary hyperparathyroidism of renal origin: Secondary | ICD-10-CM | POA: Diagnosis not present

## 2019-04-20 DIAGNOSIS — Z992 Dependence on renal dialysis: Secondary | ICD-10-CM | POA: Diagnosis not present

## 2019-04-20 DIAGNOSIS — D631 Anemia in chronic kidney disease: Secondary | ICD-10-CM | POA: Diagnosis not present

## 2019-04-20 DIAGNOSIS — E1129 Type 2 diabetes mellitus with other diabetic kidney complication: Secondary | ICD-10-CM | POA: Diagnosis not present

## 2019-04-20 DIAGNOSIS — D689 Coagulation defect, unspecified: Secondary | ICD-10-CM | POA: Diagnosis not present

## 2019-04-20 DIAGNOSIS — N186 End stage renal disease: Secondary | ICD-10-CM | POA: Diagnosis not present

## 2019-04-20 DIAGNOSIS — R52 Pain, unspecified: Secondary | ICD-10-CM | POA: Diagnosis not present

## 2019-04-20 DIAGNOSIS — N2581 Secondary hyperparathyroidism of renal origin: Secondary | ICD-10-CM | POA: Diagnosis not present

## 2019-04-22 DIAGNOSIS — R52 Pain, unspecified: Secondary | ICD-10-CM | POA: Diagnosis not present

## 2019-04-22 DIAGNOSIS — D689 Coagulation defect, unspecified: Secondary | ICD-10-CM | POA: Diagnosis not present

## 2019-04-22 DIAGNOSIS — E1129 Type 2 diabetes mellitus with other diabetic kidney complication: Secondary | ICD-10-CM | POA: Diagnosis not present

## 2019-04-22 DIAGNOSIS — N186 End stage renal disease: Secondary | ICD-10-CM | POA: Diagnosis not present

## 2019-04-22 DIAGNOSIS — D631 Anemia in chronic kidney disease: Secondary | ICD-10-CM | POA: Diagnosis not present

## 2019-04-22 DIAGNOSIS — N2581 Secondary hyperparathyroidism of renal origin: Secondary | ICD-10-CM | POA: Diagnosis not present

## 2019-04-22 DIAGNOSIS — Z992 Dependence on renal dialysis: Secondary | ICD-10-CM | POA: Diagnosis not present

## 2019-04-24 DIAGNOSIS — E1129 Type 2 diabetes mellitus with other diabetic kidney complication: Secondary | ICD-10-CM | POA: Diagnosis not present

## 2019-04-24 DIAGNOSIS — R52 Pain, unspecified: Secondary | ICD-10-CM | POA: Diagnosis not present

## 2019-04-24 DIAGNOSIS — D631 Anemia in chronic kidney disease: Secondary | ICD-10-CM | POA: Diagnosis not present

## 2019-04-24 DIAGNOSIS — Z992 Dependence on renal dialysis: Secondary | ICD-10-CM | POA: Diagnosis not present

## 2019-04-24 DIAGNOSIS — N2581 Secondary hyperparathyroidism of renal origin: Secondary | ICD-10-CM | POA: Diagnosis not present

## 2019-04-24 DIAGNOSIS — N186 End stage renal disease: Secondary | ICD-10-CM | POA: Diagnosis not present

## 2019-04-24 DIAGNOSIS — D689 Coagulation defect, unspecified: Secondary | ICD-10-CM | POA: Diagnosis not present

## 2019-04-25 DIAGNOSIS — E1122 Type 2 diabetes mellitus with diabetic chronic kidney disease: Secondary | ICD-10-CM | POA: Diagnosis not present

## 2019-04-25 DIAGNOSIS — Z992 Dependence on renal dialysis: Secondary | ICD-10-CM | POA: Diagnosis not present

## 2019-04-25 DIAGNOSIS — N186 End stage renal disease: Secondary | ICD-10-CM | POA: Diagnosis not present

## 2019-04-27 DIAGNOSIS — R52 Pain, unspecified: Secondary | ICD-10-CM | POA: Diagnosis not present

## 2019-04-27 DIAGNOSIS — E1129 Type 2 diabetes mellitus with other diabetic kidney complication: Secondary | ICD-10-CM | POA: Diagnosis not present

## 2019-04-27 DIAGNOSIS — Z992 Dependence on renal dialysis: Secondary | ICD-10-CM | POA: Diagnosis not present

## 2019-04-27 DIAGNOSIS — N2581 Secondary hyperparathyroidism of renal origin: Secondary | ICD-10-CM | POA: Diagnosis not present

## 2019-04-27 DIAGNOSIS — N186 End stage renal disease: Secondary | ICD-10-CM | POA: Diagnosis not present

## 2019-04-27 DIAGNOSIS — D689 Coagulation defect, unspecified: Secondary | ICD-10-CM | POA: Diagnosis not present

## 2019-04-27 DIAGNOSIS — D509 Iron deficiency anemia, unspecified: Secondary | ICD-10-CM | POA: Diagnosis not present

## 2019-04-29 DIAGNOSIS — R52 Pain, unspecified: Secondary | ICD-10-CM | POA: Diagnosis not present

## 2019-04-29 DIAGNOSIS — N2581 Secondary hyperparathyroidism of renal origin: Secondary | ICD-10-CM | POA: Diagnosis not present

## 2019-04-29 DIAGNOSIS — D509 Iron deficiency anemia, unspecified: Secondary | ICD-10-CM | POA: Diagnosis not present

## 2019-04-29 DIAGNOSIS — Z992 Dependence on renal dialysis: Secondary | ICD-10-CM | POA: Diagnosis not present

## 2019-04-29 DIAGNOSIS — N186 End stage renal disease: Secondary | ICD-10-CM | POA: Diagnosis not present

## 2019-04-29 DIAGNOSIS — E1129 Type 2 diabetes mellitus with other diabetic kidney complication: Secondary | ICD-10-CM | POA: Diagnosis not present

## 2019-04-29 DIAGNOSIS — D689 Coagulation defect, unspecified: Secondary | ICD-10-CM | POA: Diagnosis not present

## 2019-05-01 DIAGNOSIS — N186 End stage renal disease: Secondary | ICD-10-CM | POA: Diagnosis not present

## 2019-05-01 DIAGNOSIS — E1129 Type 2 diabetes mellitus with other diabetic kidney complication: Secondary | ICD-10-CM | POA: Diagnosis not present

## 2019-05-01 DIAGNOSIS — N2581 Secondary hyperparathyroidism of renal origin: Secondary | ICD-10-CM | POA: Diagnosis not present

## 2019-05-01 DIAGNOSIS — D509 Iron deficiency anemia, unspecified: Secondary | ICD-10-CM | POA: Diagnosis not present

## 2019-05-01 DIAGNOSIS — D689 Coagulation defect, unspecified: Secondary | ICD-10-CM | POA: Diagnosis not present

## 2019-05-01 DIAGNOSIS — Z992 Dependence on renal dialysis: Secondary | ICD-10-CM | POA: Diagnosis not present

## 2019-05-01 DIAGNOSIS — R52 Pain, unspecified: Secondary | ICD-10-CM | POA: Diagnosis not present

## 2019-05-04 DIAGNOSIS — D509 Iron deficiency anemia, unspecified: Secondary | ICD-10-CM | POA: Diagnosis not present

## 2019-05-04 DIAGNOSIS — R52 Pain, unspecified: Secondary | ICD-10-CM | POA: Diagnosis not present

## 2019-05-04 DIAGNOSIS — E1129 Type 2 diabetes mellitus with other diabetic kidney complication: Secondary | ICD-10-CM | POA: Diagnosis not present

## 2019-05-04 DIAGNOSIS — N186 End stage renal disease: Secondary | ICD-10-CM | POA: Diagnosis not present

## 2019-05-04 DIAGNOSIS — D689 Coagulation defect, unspecified: Secondary | ICD-10-CM | POA: Diagnosis not present

## 2019-05-04 DIAGNOSIS — Z992 Dependence on renal dialysis: Secondary | ICD-10-CM | POA: Diagnosis not present

## 2019-05-04 DIAGNOSIS — N2581 Secondary hyperparathyroidism of renal origin: Secondary | ICD-10-CM | POA: Diagnosis not present

## 2019-05-06 DIAGNOSIS — E1129 Type 2 diabetes mellitus with other diabetic kidney complication: Secondary | ICD-10-CM | POA: Diagnosis not present

## 2019-05-06 DIAGNOSIS — R52 Pain, unspecified: Secondary | ICD-10-CM | POA: Diagnosis not present

## 2019-05-06 DIAGNOSIS — N2581 Secondary hyperparathyroidism of renal origin: Secondary | ICD-10-CM | POA: Diagnosis not present

## 2019-05-06 DIAGNOSIS — D689 Coagulation defect, unspecified: Secondary | ICD-10-CM | POA: Diagnosis not present

## 2019-05-06 DIAGNOSIS — Z992 Dependence on renal dialysis: Secondary | ICD-10-CM | POA: Diagnosis not present

## 2019-05-06 DIAGNOSIS — N186 End stage renal disease: Secondary | ICD-10-CM | POA: Diagnosis not present

## 2019-05-06 DIAGNOSIS — D509 Iron deficiency anemia, unspecified: Secondary | ICD-10-CM | POA: Diagnosis not present

## 2019-05-08 DIAGNOSIS — R52 Pain, unspecified: Secondary | ICD-10-CM | POA: Diagnosis not present

## 2019-05-08 DIAGNOSIS — N2581 Secondary hyperparathyroidism of renal origin: Secondary | ICD-10-CM | POA: Diagnosis not present

## 2019-05-08 DIAGNOSIS — N186 End stage renal disease: Secondary | ICD-10-CM | POA: Diagnosis not present

## 2019-05-08 DIAGNOSIS — D689 Coagulation defect, unspecified: Secondary | ICD-10-CM | POA: Diagnosis not present

## 2019-05-08 DIAGNOSIS — Z992 Dependence on renal dialysis: Secondary | ICD-10-CM | POA: Diagnosis not present

## 2019-05-08 DIAGNOSIS — D509 Iron deficiency anemia, unspecified: Secondary | ICD-10-CM | POA: Diagnosis not present

## 2019-05-08 DIAGNOSIS — E1129 Type 2 diabetes mellitus with other diabetic kidney complication: Secondary | ICD-10-CM | POA: Diagnosis not present

## 2019-05-11 DIAGNOSIS — N2581 Secondary hyperparathyroidism of renal origin: Secondary | ICD-10-CM | POA: Diagnosis not present

## 2019-05-11 DIAGNOSIS — E1129 Type 2 diabetes mellitus with other diabetic kidney complication: Secondary | ICD-10-CM | POA: Diagnosis not present

## 2019-05-11 DIAGNOSIS — R52 Pain, unspecified: Secondary | ICD-10-CM | POA: Diagnosis not present

## 2019-05-11 DIAGNOSIS — Z992 Dependence on renal dialysis: Secondary | ICD-10-CM | POA: Diagnosis not present

## 2019-05-11 DIAGNOSIS — D689 Coagulation defect, unspecified: Secondary | ICD-10-CM | POA: Diagnosis not present

## 2019-05-11 DIAGNOSIS — D509 Iron deficiency anemia, unspecified: Secondary | ICD-10-CM | POA: Diagnosis not present

## 2019-05-11 DIAGNOSIS — N186 End stage renal disease: Secondary | ICD-10-CM | POA: Diagnosis not present

## 2019-05-13 DIAGNOSIS — N186 End stage renal disease: Secondary | ICD-10-CM | POA: Diagnosis not present

## 2019-05-13 DIAGNOSIS — E1129 Type 2 diabetes mellitus with other diabetic kidney complication: Secondary | ICD-10-CM | POA: Diagnosis not present

## 2019-05-13 DIAGNOSIS — D509 Iron deficiency anemia, unspecified: Secondary | ICD-10-CM | POA: Diagnosis not present

## 2019-05-13 DIAGNOSIS — R52 Pain, unspecified: Secondary | ICD-10-CM | POA: Diagnosis not present

## 2019-05-13 DIAGNOSIS — D689 Coagulation defect, unspecified: Secondary | ICD-10-CM | POA: Diagnosis not present

## 2019-05-13 DIAGNOSIS — N2581 Secondary hyperparathyroidism of renal origin: Secondary | ICD-10-CM | POA: Diagnosis not present

## 2019-05-13 DIAGNOSIS — Z992 Dependence on renal dialysis: Secondary | ICD-10-CM | POA: Diagnosis not present

## 2019-05-15 DIAGNOSIS — E1129 Type 2 diabetes mellitus with other diabetic kidney complication: Secondary | ICD-10-CM | POA: Diagnosis not present

## 2019-05-15 DIAGNOSIS — D689 Coagulation defect, unspecified: Secondary | ICD-10-CM | POA: Diagnosis not present

## 2019-05-15 DIAGNOSIS — N2581 Secondary hyperparathyroidism of renal origin: Secondary | ICD-10-CM | POA: Diagnosis not present

## 2019-05-15 DIAGNOSIS — Z992 Dependence on renal dialysis: Secondary | ICD-10-CM | POA: Diagnosis not present

## 2019-05-15 DIAGNOSIS — N186 End stage renal disease: Secondary | ICD-10-CM | POA: Diagnosis not present

## 2019-05-15 DIAGNOSIS — D509 Iron deficiency anemia, unspecified: Secondary | ICD-10-CM | POA: Diagnosis not present

## 2019-05-15 DIAGNOSIS — R52 Pain, unspecified: Secondary | ICD-10-CM | POA: Diagnosis not present

## 2019-05-18 DIAGNOSIS — Z992 Dependence on renal dialysis: Secondary | ICD-10-CM | POA: Diagnosis not present

## 2019-05-18 DIAGNOSIS — N186 End stage renal disease: Secondary | ICD-10-CM | POA: Diagnosis not present

## 2019-05-18 DIAGNOSIS — D509 Iron deficiency anemia, unspecified: Secondary | ICD-10-CM | POA: Diagnosis not present

## 2019-05-18 DIAGNOSIS — E1129 Type 2 diabetes mellitus with other diabetic kidney complication: Secondary | ICD-10-CM | POA: Diagnosis not present

## 2019-05-18 DIAGNOSIS — R52 Pain, unspecified: Secondary | ICD-10-CM | POA: Diagnosis not present

## 2019-05-18 DIAGNOSIS — N2581 Secondary hyperparathyroidism of renal origin: Secondary | ICD-10-CM | POA: Diagnosis not present

## 2019-05-18 DIAGNOSIS — D689 Coagulation defect, unspecified: Secondary | ICD-10-CM | POA: Diagnosis not present

## 2019-05-20 DIAGNOSIS — Z992 Dependence on renal dialysis: Secondary | ICD-10-CM | POA: Diagnosis not present

## 2019-05-20 DIAGNOSIS — N2581 Secondary hyperparathyroidism of renal origin: Secondary | ICD-10-CM | POA: Diagnosis not present

## 2019-05-20 DIAGNOSIS — E1129 Type 2 diabetes mellitus with other diabetic kidney complication: Secondary | ICD-10-CM | POA: Diagnosis not present

## 2019-05-20 DIAGNOSIS — D689 Coagulation defect, unspecified: Secondary | ICD-10-CM | POA: Diagnosis not present

## 2019-05-20 DIAGNOSIS — N186 End stage renal disease: Secondary | ICD-10-CM | POA: Diagnosis not present

## 2019-05-20 DIAGNOSIS — D509 Iron deficiency anemia, unspecified: Secondary | ICD-10-CM | POA: Diagnosis not present

## 2019-05-20 DIAGNOSIS — R52 Pain, unspecified: Secondary | ICD-10-CM | POA: Diagnosis not present

## 2019-05-22 DIAGNOSIS — Z992 Dependence on renal dialysis: Secondary | ICD-10-CM | POA: Diagnosis not present

## 2019-05-22 DIAGNOSIS — R52 Pain, unspecified: Secondary | ICD-10-CM | POA: Diagnosis not present

## 2019-05-22 DIAGNOSIS — N2581 Secondary hyperparathyroidism of renal origin: Secondary | ICD-10-CM | POA: Diagnosis not present

## 2019-05-22 DIAGNOSIS — N186 End stage renal disease: Secondary | ICD-10-CM | POA: Diagnosis not present

## 2019-05-22 DIAGNOSIS — D689 Coagulation defect, unspecified: Secondary | ICD-10-CM | POA: Diagnosis not present

## 2019-05-22 DIAGNOSIS — E1129 Type 2 diabetes mellitus with other diabetic kidney complication: Secondary | ICD-10-CM | POA: Diagnosis not present

## 2019-05-22 DIAGNOSIS — D509 Iron deficiency anemia, unspecified: Secondary | ICD-10-CM | POA: Diagnosis not present

## 2019-05-23 DIAGNOSIS — N186 End stage renal disease: Secondary | ICD-10-CM | POA: Diagnosis not present

## 2019-05-23 DIAGNOSIS — Z992 Dependence on renal dialysis: Secondary | ICD-10-CM | POA: Diagnosis not present

## 2019-05-23 DIAGNOSIS — E1122 Type 2 diabetes mellitus with diabetic chronic kidney disease: Secondary | ICD-10-CM | POA: Diagnosis not present

## 2019-05-24 DIAGNOSIS — E119 Type 2 diabetes mellitus without complications: Secondary | ICD-10-CM | POA: Diagnosis not present

## 2019-05-25 DIAGNOSIS — N2581 Secondary hyperparathyroidism of renal origin: Secondary | ICD-10-CM | POA: Diagnosis not present

## 2019-05-25 DIAGNOSIS — R52 Pain, unspecified: Secondary | ICD-10-CM | POA: Diagnosis not present

## 2019-05-25 DIAGNOSIS — D509 Iron deficiency anemia, unspecified: Secondary | ICD-10-CM | POA: Diagnosis not present

## 2019-05-25 DIAGNOSIS — E1129 Type 2 diabetes mellitus with other diabetic kidney complication: Secondary | ICD-10-CM | POA: Diagnosis not present

## 2019-05-25 DIAGNOSIS — Z992 Dependence on renal dialysis: Secondary | ICD-10-CM | POA: Diagnosis not present

## 2019-05-25 DIAGNOSIS — N186 End stage renal disease: Secondary | ICD-10-CM | POA: Diagnosis not present

## 2019-05-25 DIAGNOSIS — D631 Anemia in chronic kidney disease: Secondary | ICD-10-CM | POA: Diagnosis not present

## 2019-05-25 DIAGNOSIS — D689 Coagulation defect, unspecified: Secondary | ICD-10-CM | POA: Diagnosis not present

## 2019-05-27 DIAGNOSIS — Z992 Dependence on renal dialysis: Secondary | ICD-10-CM | POA: Diagnosis not present

## 2019-05-27 DIAGNOSIS — E1129 Type 2 diabetes mellitus with other diabetic kidney complication: Secondary | ICD-10-CM | POA: Diagnosis not present

## 2019-05-27 DIAGNOSIS — D689 Coagulation defect, unspecified: Secondary | ICD-10-CM | POA: Diagnosis not present

## 2019-05-27 DIAGNOSIS — D631 Anemia in chronic kidney disease: Secondary | ICD-10-CM | POA: Diagnosis not present

## 2019-05-27 DIAGNOSIS — R52 Pain, unspecified: Secondary | ICD-10-CM | POA: Diagnosis not present

## 2019-05-27 DIAGNOSIS — N2581 Secondary hyperparathyroidism of renal origin: Secondary | ICD-10-CM | POA: Diagnosis not present

## 2019-05-27 DIAGNOSIS — D509 Iron deficiency anemia, unspecified: Secondary | ICD-10-CM | POA: Diagnosis not present

## 2019-05-27 DIAGNOSIS — N186 End stage renal disease: Secondary | ICD-10-CM | POA: Diagnosis not present

## 2019-05-29 DIAGNOSIS — R52 Pain, unspecified: Secondary | ICD-10-CM | POA: Diagnosis not present

## 2019-05-29 DIAGNOSIS — E1129 Type 2 diabetes mellitus with other diabetic kidney complication: Secondary | ICD-10-CM | POA: Diagnosis not present

## 2019-05-29 DIAGNOSIS — N2581 Secondary hyperparathyroidism of renal origin: Secondary | ICD-10-CM | POA: Diagnosis not present

## 2019-05-29 DIAGNOSIS — D689 Coagulation defect, unspecified: Secondary | ICD-10-CM | POA: Diagnosis not present

## 2019-05-29 DIAGNOSIS — D631 Anemia in chronic kidney disease: Secondary | ICD-10-CM | POA: Diagnosis not present

## 2019-05-29 DIAGNOSIS — D509 Iron deficiency anemia, unspecified: Secondary | ICD-10-CM | POA: Diagnosis not present

## 2019-05-29 DIAGNOSIS — Z992 Dependence on renal dialysis: Secondary | ICD-10-CM | POA: Diagnosis not present

## 2019-05-29 DIAGNOSIS — N186 End stage renal disease: Secondary | ICD-10-CM | POA: Diagnosis not present

## 2019-06-01 DIAGNOSIS — E1129 Type 2 diabetes mellitus with other diabetic kidney complication: Secondary | ICD-10-CM | POA: Diagnosis not present

## 2019-06-01 DIAGNOSIS — N186 End stage renal disease: Secondary | ICD-10-CM | POA: Diagnosis not present

## 2019-06-01 DIAGNOSIS — D631 Anemia in chronic kidney disease: Secondary | ICD-10-CM | POA: Diagnosis not present

## 2019-06-01 DIAGNOSIS — Z992 Dependence on renal dialysis: Secondary | ICD-10-CM | POA: Diagnosis not present

## 2019-06-01 DIAGNOSIS — N2581 Secondary hyperparathyroidism of renal origin: Secondary | ICD-10-CM | POA: Diagnosis not present

## 2019-06-01 DIAGNOSIS — D509 Iron deficiency anemia, unspecified: Secondary | ICD-10-CM | POA: Diagnosis not present

## 2019-06-01 DIAGNOSIS — R52 Pain, unspecified: Secondary | ICD-10-CM | POA: Diagnosis not present

## 2019-06-01 DIAGNOSIS — D689 Coagulation defect, unspecified: Secondary | ICD-10-CM | POA: Diagnosis not present

## 2019-06-03 DIAGNOSIS — R52 Pain, unspecified: Secondary | ICD-10-CM | POA: Diagnosis not present

## 2019-06-03 DIAGNOSIS — D689 Coagulation defect, unspecified: Secondary | ICD-10-CM | POA: Diagnosis not present

## 2019-06-03 DIAGNOSIS — Z992 Dependence on renal dialysis: Secondary | ICD-10-CM | POA: Diagnosis not present

## 2019-06-03 DIAGNOSIS — D509 Iron deficiency anemia, unspecified: Secondary | ICD-10-CM | POA: Diagnosis not present

## 2019-06-03 DIAGNOSIS — D631 Anemia in chronic kidney disease: Secondary | ICD-10-CM | POA: Diagnosis not present

## 2019-06-03 DIAGNOSIS — E1129 Type 2 diabetes mellitus with other diabetic kidney complication: Secondary | ICD-10-CM | POA: Diagnosis not present

## 2019-06-03 DIAGNOSIS — N2581 Secondary hyperparathyroidism of renal origin: Secondary | ICD-10-CM | POA: Diagnosis not present

## 2019-06-03 DIAGNOSIS — N186 End stage renal disease: Secondary | ICD-10-CM | POA: Diagnosis not present

## 2019-06-05 DIAGNOSIS — D689 Coagulation defect, unspecified: Secondary | ICD-10-CM | POA: Diagnosis not present

## 2019-06-05 DIAGNOSIS — D631 Anemia in chronic kidney disease: Secondary | ICD-10-CM | POA: Diagnosis not present

## 2019-06-05 DIAGNOSIS — E1129 Type 2 diabetes mellitus with other diabetic kidney complication: Secondary | ICD-10-CM | POA: Diagnosis not present

## 2019-06-05 DIAGNOSIS — R52 Pain, unspecified: Secondary | ICD-10-CM | POA: Diagnosis not present

## 2019-06-05 DIAGNOSIS — N2581 Secondary hyperparathyroidism of renal origin: Secondary | ICD-10-CM | POA: Diagnosis not present

## 2019-06-05 DIAGNOSIS — N186 End stage renal disease: Secondary | ICD-10-CM | POA: Diagnosis not present

## 2019-06-05 DIAGNOSIS — D509 Iron deficiency anemia, unspecified: Secondary | ICD-10-CM | POA: Diagnosis not present

## 2019-06-05 DIAGNOSIS — Z992 Dependence on renal dialysis: Secondary | ICD-10-CM | POA: Diagnosis not present

## 2019-06-08 DIAGNOSIS — E1021 Type 1 diabetes mellitus with diabetic nephropathy: Secondary | ICD-10-CM | POA: Diagnosis not present

## 2019-06-08 DIAGNOSIS — R52 Pain, unspecified: Secondary | ICD-10-CM | POA: Diagnosis not present

## 2019-06-08 DIAGNOSIS — D631 Anemia in chronic kidney disease: Secondary | ICD-10-CM | POA: Diagnosis not present

## 2019-06-08 DIAGNOSIS — N2581 Secondary hyperparathyroidism of renal origin: Secondary | ICD-10-CM | POA: Diagnosis not present

## 2019-06-08 DIAGNOSIS — D509 Iron deficiency anemia, unspecified: Secondary | ICD-10-CM | POA: Diagnosis not present

## 2019-06-08 DIAGNOSIS — Z992 Dependence on renal dialysis: Secondary | ICD-10-CM | POA: Diagnosis not present

## 2019-06-08 DIAGNOSIS — E1142 Type 2 diabetes mellitus with diabetic polyneuropathy: Secondary | ICD-10-CM | POA: Diagnosis not present

## 2019-06-08 DIAGNOSIS — D689 Coagulation defect, unspecified: Secondary | ICD-10-CM | POA: Diagnosis not present

## 2019-06-08 DIAGNOSIS — N184 Chronic kidney disease, stage 4 (severe): Secondary | ICD-10-CM | POA: Diagnosis not present

## 2019-06-08 DIAGNOSIS — Z79899 Other long term (current) drug therapy: Secondary | ICD-10-CM | POA: Diagnosis not present

## 2019-06-08 DIAGNOSIS — N186 End stage renal disease: Secondary | ICD-10-CM | POA: Diagnosis not present

## 2019-06-08 DIAGNOSIS — E1129 Type 2 diabetes mellitus with other diabetic kidney complication: Secondary | ICD-10-CM | POA: Diagnosis not present

## 2019-06-08 DIAGNOSIS — E782 Mixed hyperlipidemia: Secondary | ICD-10-CM | POA: Diagnosis not present

## 2019-06-08 DIAGNOSIS — I129 Hypertensive chronic kidney disease with stage 1 through stage 4 chronic kidney disease, or unspecified chronic kidney disease: Secondary | ICD-10-CM | POA: Diagnosis not present

## 2019-06-10 DIAGNOSIS — D689 Coagulation defect, unspecified: Secondary | ICD-10-CM | POA: Diagnosis not present

## 2019-06-10 DIAGNOSIS — N2581 Secondary hyperparathyroidism of renal origin: Secondary | ICD-10-CM | POA: Diagnosis not present

## 2019-06-10 DIAGNOSIS — Z992 Dependence on renal dialysis: Secondary | ICD-10-CM | POA: Diagnosis not present

## 2019-06-10 DIAGNOSIS — D631 Anemia in chronic kidney disease: Secondary | ICD-10-CM | POA: Diagnosis not present

## 2019-06-10 DIAGNOSIS — R52 Pain, unspecified: Secondary | ICD-10-CM | POA: Diagnosis not present

## 2019-06-10 DIAGNOSIS — N186 End stage renal disease: Secondary | ICD-10-CM | POA: Diagnosis not present

## 2019-06-10 DIAGNOSIS — E1129 Type 2 diabetes mellitus with other diabetic kidney complication: Secondary | ICD-10-CM | POA: Diagnosis not present

## 2019-06-10 DIAGNOSIS — D509 Iron deficiency anemia, unspecified: Secondary | ICD-10-CM | POA: Diagnosis not present

## 2019-06-12 DIAGNOSIS — R52 Pain, unspecified: Secondary | ICD-10-CM | POA: Diagnosis not present

## 2019-06-12 DIAGNOSIS — D631 Anemia in chronic kidney disease: Secondary | ICD-10-CM | POA: Diagnosis not present

## 2019-06-12 DIAGNOSIS — N2581 Secondary hyperparathyroidism of renal origin: Secondary | ICD-10-CM | POA: Diagnosis not present

## 2019-06-12 DIAGNOSIS — Z992 Dependence on renal dialysis: Secondary | ICD-10-CM | POA: Diagnosis not present

## 2019-06-12 DIAGNOSIS — N186 End stage renal disease: Secondary | ICD-10-CM | POA: Diagnosis not present

## 2019-06-12 DIAGNOSIS — E1129 Type 2 diabetes mellitus with other diabetic kidney complication: Secondary | ICD-10-CM | POA: Diagnosis not present

## 2019-06-12 DIAGNOSIS — D689 Coagulation defect, unspecified: Secondary | ICD-10-CM | POA: Diagnosis not present

## 2019-06-12 DIAGNOSIS — D509 Iron deficiency anemia, unspecified: Secondary | ICD-10-CM | POA: Diagnosis not present

## 2019-06-15 DIAGNOSIS — D689 Coagulation defect, unspecified: Secondary | ICD-10-CM | POA: Diagnosis not present

## 2019-06-15 DIAGNOSIS — N186 End stage renal disease: Secondary | ICD-10-CM | POA: Diagnosis not present

## 2019-06-15 DIAGNOSIS — Z992 Dependence on renal dialysis: Secondary | ICD-10-CM | POA: Diagnosis not present

## 2019-06-15 DIAGNOSIS — I700 Atherosclerosis of aorta: Secondary | ICD-10-CM | POA: Diagnosis not present

## 2019-06-15 DIAGNOSIS — N2581 Secondary hyperparathyroidism of renal origin: Secondary | ICD-10-CM | POA: Diagnosis not present

## 2019-06-15 DIAGNOSIS — E1129 Type 2 diabetes mellitus with other diabetic kidney complication: Secondary | ICD-10-CM | POA: Diagnosis not present

## 2019-06-15 DIAGNOSIS — E1165 Type 2 diabetes mellitus with hyperglycemia: Secondary | ICD-10-CM | POA: Diagnosis not present

## 2019-06-15 DIAGNOSIS — D631 Anemia in chronic kidney disease: Secondary | ICD-10-CM | POA: Diagnosis not present

## 2019-06-15 DIAGNOSIS — E1122 Type 2 diabetes mellitus with diabetic chronic kidney disease: Secondary | ICD-10-CM | POA: Diagnosis not present

## 2019-06-15 DIAGNOSIS — R52 Pain, unspecified: Secondary | ICD-10-CM | POA: Diagnosis not present

## 2019-06-15 DIAGNOSIS — Z0000 Encounter for general adult medical examination without abnormal findings: Secondary | ICD-10-CM | POA: Diagnosis not present

## 2019-06-15 DIAGNOSIS — D509 Iron deficiency anemia, unspecified: Secondary | ICD-10-CM | POA: Diagnosis not present

## 2019-06-17 DIAGNOSIS — N186 End stage renal disease: Secondary | ICD-10-CM | POA: Diagnosis not present

## 2019-06-17 DIAGNOSIS — D689 Coagulation defect, unspecified: Secondary | ICD-10-CM | POA: Diagnosis not present

## 2019-06-17 DIAGNOSIS — N2581 Secondary hyperparathyroidism of renal origin: Secondary | ICD-10-CM | POA: Diagnosis not present

## 2019-06-17 DIAGNOSIS — D509 Iron deficiency anemia, unspecified: Secondary | ICD-10-CM | POA: Diagnosis not present

## 2019-06-17 DIAGNOSIS — R52 Pain, unspecified: Secondary | ICD-10-CM | POA: Diagnosis not present

## 2019-06-17 DIAGNOSIS — Z992 Dependence on renal dialysis: Secondary | ICD-10-CM | POA: Diagnosis not present

## 2019-06-17 DIAGNOSIS — D631 Anemia in chronic kidney disease: Secondary | ICD-10-CM | POA: Diagnosis not present

## 2019-06-17 DIAGNOSIS — E1129 Type 2 diabetes mellitus with other diabetic kidney complication: Secondary | ICD-10-CM | POA: Diagnosis not present

## 2019-06-19 DIAGNOSIS — E1129 Type 2 diabetes mellitus with other diabetic kidney complication: Secondary | ICD-10-CM | POA: Diagnosis not present

## 2019-06-19 DIAGNOSIS — N2581 Secondary hyperparathyroidism of renal origin: Secondary | ICD-10-CM | POA: Diagnosis not present

## 2019-06-19 DIAGNOSIS — R52 Pain, unspecified: Secondary | ICD-10-CM | POA: Diagnosis not present

## 2019-06-19 DIAGNOSIS — D631 Anemia in chronic kidney disease: Secondary | ICD-10-CM | POA: Diagnosis not present

## 2019-06-19 DIAGNOSIS — N186 End stage renal disease: Secondary | ICD-10-CM | POA: Diagnosis not present

## 2019-06-19 DIAGNOSIS — D509 Iron deficiency anemia, unspecified: Secondary | ICD-10-CM | POA: Diagnosis not present

## 2019-06-19 DIAGNOSIS — Z992 Dependence on renal dialysis: Secondary | ICD-10-CM | POA: Diagnosis not present

## 2019-06-19 DIAGNOSIS — D689 Coagulation defect, unspecified: Secondary | ICD-10-CM | POA: Diagnosis not present

## 2019-06-22 DIAGNOSIS — D631 Anemia in chronic kidney disease: Secondary | ICD-10-CM | POA: Diagnosis not present

## 2019-06-22 DIAGNOSIS — N186 End stage renal disease: Secondary | ICD-10-CM | POA: Diagnosis not present

## 2019-06-22 DIAGNOSIS — D509 Iron deficiency anemia, unspecified: Secondary | ICD-10-CM | POA: Diagnosis not present

## 2019-06-22 DIAGNOSIS — Z992 Dependence on renal dialysis: Secondary | ICD-10-CM | POA: Diagnosis not present

## 2019-06-22 DIAGNOSIS — D689 Coagulation defect, unspecified: Secondary | ICD-10-CM | POA: Diagnosis not present

## 2019-06-22 DIAGNOSIS — R52 Pain, unspecified: Secondary | ICD-10-CM | POA: Diagnosis not present

## 2019-06-22 DIAGNOSIS — N2581 Secondary hyperparathyroidism of renal origin: Secondary | ICD-10-CM | POA: Diagnosis not present

## 2019-06-22 DIAGNOSIS — E1129 Type 2 diabetes mellitus with other diabetic kidney complication: Secondary | ICD-10-CM | POA: Diagnosis not present

## 2019-06-23 DIAGNOSIS — N186 End stage renal disease: Secondary | ICD-10-CM | POA: Diagnosis not present

## 2019-06-23 DIAGNOSIS — E1122 Type 2 diabetes mellitus with diabetic chronic kidney disease: Secondary | ICD-10-CM | POA: Diagnosis not present

## 2019-06-23 DIAGNOSIS — Z992 Dependence on renal dialysis: Secondary | ICD-10-CM | POA: Diagnosis not present

## 2019-06-24 DIAGNOSIS — D509 Iron deficiency anemia, unspecified: Secondary | ICD-10-CM | POA: Diagnosis not present

## 2019-06-24 DIAGNOSIS — N2581 Secondary hyperparathyroidism of renal origin: Secondary | ICD-10-CM | POA: Diagnosis not present

## 2019-06-24 DIAGNOSIS — Z992 Dependence on renal dialysis: Secondary | ICD-10-CM | POA: Diagnosis not present

## 2019-06-24 DIAGNOSIS — R52 Pain, unspecified: Secondary | ICD-10-CM | POA: Diagnosis not present

## 2019-06-24 DIAGNOSIS — E1129 Type 2 diabetes mellitus with other diabetic kidney complication: Secondary | ICD-10-CM | POA: Diagnosis not present

## 2019-06-24 DIAGNOSIS — L299 Pruritus, unspecified: Secondary | ICD-10-CM | POA: Diagnosis not present

## 2019-06-24 DIAGNOSIS — D689 Coagulation defect, unspecified: Secondary | ICD-10-CM | POA: Diagnosis not present

## 2019-06-24 DIAGNOSIS — N186 End stage renal disease: Secondary | ICD-10-CM | POA: Diagnosis not present

## 2019-06-26 DIAGNOSIS — N2581 Secondary hyperparathyroidism of renal origin: Secondary | ICD-10-CM | POA: Diagnosis not present

## 2019-06-26 DIAGNOSIS — N186 End stage renal disease: Secondary | ICD-10-CM | POA: Diagnosis not present

## 2019-06-26 DIAGNOSIS — L299 Pruritus, unspecified: Secondary | ICD-10-CM | POA: Diagnosis not present

## 2019-06-26 DIAGNOSIS — Z992 Dependence on renal dialysis: Secondary | ICD-10-CM | POA: Diagnosis not present

## 2019-06-26 DIAGNOSIS — E1129 Type 2 diabetes mellitus with other diabetic kidney complication: Secondary | ICD-10-CM | POA: Diagnosis not present

## 2019-06-26 DIAGNOSIS — D689 Coagulation defect, unspecified: Secondary | ICD-10-CM | POA: Diagnosis not present

## 2019-06-26 DIAGNOSIS — R52 Pain, unspecified: Secondary | ICD-10-CM | POA: Diagnosis not present

## 2019-06-26 DIAGNOSIS — D509 Iron deficiency anemia, unspecified: Secondary | ICD-10-CM | POA: Diagnosis not present

## 2019-06-29 DIAGNOSIS — Z992 Dependence on renal dialysis: Secondary | ICD-10-CM | POA: Diagnosis not present

## 2019-06-29 DIAGNOSIS — R52 Pain, unspecified: Secondary | ICD-10-CM | POA: Diagnosis not present

## 2019-06-29 DIAGNOSIS — D509 Iron deficiency anemia, unspecified: Secondary | ICD-10-CM | POA: Diagnosis not present

## 2019-06-29 DIAGNOSIS — N2581 Secondary hyperparathyroidism of renal origin: Secondary | ICD-10-CM | POA: Diagnosis not present

## 2019-06-29 DIAGNOSIS — N186 End stage renal disease: Secondary | ICD-10-CM | POA: Diagnosis not present

## 2019-06-29 DIAGNOSIS — L299 Pruritus, unspecified: Secondary | ICD-10-CM | POA: Diagnosis not present

## 2019-06-29 DIAGNOSIS — E1129 Type 2 diabetes mellitus with other diabetic kidney complication: Secondary | ICD-10-CM | POA: Diagnosis not present

## 2019-06-29 DIAGNOSIS — D689 Coagulation defect, unspecified: Secondary | ICD-10-CM | POA: Diagnosis not present

## 2019-07-01 DIAGNOSIS — E1129 Type 2 diabetes mellitus with other diabetic kidney complication: Secondary | ICD-10-CM | POA: Diagnosis not present

## 2019-07-01 DIAGNOSIS — L299 Pruritus, unspecified: Secondary | ICD-10-CM | POA: Diagnosis not present

## 2019-07-01 DIAGNOSIS — Z992 Dependence on renal dialysis: Secondary | ICD-10-CM | POA: Diagnosis not present

## 2019-07-01 DIAGNOSIS — N2581 Secondary hyperparathyroidism of renal origin: Secondary | ICD-10-CM | POA: Diagnosis not present

## 2019-07-01 DIAGNOSIS — D689 Coagulation defect, unspecified: Secondary | ICD-10-CM | POA: Diagnosis not present

## 2019-07-01 DIAGNOSIS — D509 Iron deficiency anemia, unspecified: Secondary | ICD-10-CM | POA: Diagnosis not present

## 2019-07-01 DIAGNOSIS — N186 End stage renal disease: Secondary | ICD-10-CM | POA: Diagnosis not present

## 2019-07-01 DIAGNOSIS — R52 Pain, unspecified: Secondary | ICD-10-CM | POA: Diagnosis not present

## 2019-07-02 DIAGNOSIS — Z992 Dependence on renal dialysis: Secondary | ICD-10-CM | POA: Diagnosis not present

## 2019-07-02 DIAGNOSIS — N186 End stage renal disease: Secondary | ICD-10-CM | POA: Diagnosis not present

## 2019-07-02 DIAGNOSIS — I871 Compression of vein: Secondary | ICD-10-CM | POA: Diagnosis not present

## 2019-07-03 DIAGNOSIS — N2581 Secondary hyperparathyroidism of renal origin: Secondary | ICD-10-CM | POA: Diagnosis not present

## 2019-07-03 DIAGNOSIS — D509 Iron deficiency anemia, unspecified: Secondary | ICD-10-CM | POA: Diagnosis not present

## 2019-07-03 DIAGNOSIS — R52 Pain, unspecified: Secondary | ICD-10-CM | POA: Diagnosis not present

## 2019-07-03 DIAGNOSIS — E1129 Type 2 diabetes mellitus with other diabetic kidney complication: Secondary | ICD-10-CM | POA: Diagnosis not present

## 2019-07-03 DIAGNOSIS — D689 Coagulation defect, unspecified: Secondary | ICD-10-CM | POA: Diagnosis not present

## 2019-07-03 DIAGNOSIS — Z992 Dependence on renal dialysis: Secondary | ICD-10-CM | POA: Diagnosis not present

## 2019-07-03 DIAGNOSIS — N186 End stage renal disease: Secondary | ICD-10-CM | POA: Diagnosis not present

## 2019-07-03 DIAGNOSIS — L299 Pruritus, unspecified: Secondary | ICD-10-CM | POA: Diagnosis not present

## 2019-07-06 DIAGNOSIS — E1129 Type 2 diabetes mellitus with other diabetic kidney complication: Secondary | ICD-10-CM | POA: Diagnosis not present

## 2019-07-06 DIAGNOSIS — D509 Iron deficiency anemia, unspecified: Secondary | ICD-10-CM | POA: Diagnosis not present

## 2019-07-06 DIAGNOSIS — N2581 Secondary hyperparathyroidism of renal origin: Secondary | ICD-10-CM | POA: Diagnosis not present

## 2019-07-06 DIAGNOSIS — N186 End stage renal disease: Secondary | ICD-10-CM | POA: Diagnosis not present

## 2019-07-06 DIAGNOSIS — Z992 Dependence on renal dialysis: Secondary | ICD-10-CM | POA: Diagnosis not present

## 2019-07-06 DIAGNOSIS — L299 Pruritus, unspecified: Secondary | ICD-10-CM | POA: Diagnosis not present

## 2019-07-06 DIAGNOSIS — D689 Coagulation defect, unspecified: Secondary | ICD-10-CM | POA: Diagnosis not present

## 2019-07-06 DIAGNOSIS — R52 Pain, unspecified: Secondary | ICD-10-CM | POA: Diagnosis not present

## 2019-07-08 DIAGNOSIS — E1129 Type 2 diabetes mellitus with other diabetic kidney complication: Secondary | ICD-10-CM | POA: Diagnosis not present

## 2019-07-08 DIAGNOSIS — Z992 Dependence on renal dialysis: Secondary | ICD-10-CM | POA: Diagnosis not present

## 2019-07-08 DIAGNOSIS — L299 Pruritus, unspecified: Secondary | ICD-10-CM | POA: Diagnosis not present

## 2019-07-08 DIAGNOSIS — D509 Iron deficiency anemia, unspecified: Secondary | ICD-10-CM | POA: Diagnosis not present

## 2019-07-08 DIAGNOSIS — N186 End stage renal disease: Secondary | ICD-10-CM | POA: Diagnosis not present

## 2019-07-08 DIAGNOSIS — D689 Coagulation defect, unspecified: Secondary | ICD-10-CM | POA: Diagnosis not present

## 2019-07-08 DIAGNOSIS — R52 Pain, unspecified: Secondary | ICD-10-CM | POA: Diagnosis not present

## 2019-07-08 DIAGNOSIS — N2581 Secondary hyperparathyroidism of renal origin: Secondary | ICD-10-CM | POA: Diagnosis not present

## 2019-07-10 DIAGNOSIS — N186 End stage renal disease: Secondary | ICD-10-CM | POA: Diagnosis not present

## 2019-07-10 DIAGNOSIS — D689 Coagulation defect, unspecified: Secondary | ICD-10-CM | POA: Diagnosis not present

## 2019-07-10 DIAGNOSIS — E1129 Type 2 diabetes mellitus with other diabetic kidney complication: Secondary | ICD-10-CM | POA: Diagnosis not present

## 2019-07-10 DIAGNOSIS — L299 Pruritus, unspecified: Secondary | ICD-10-CM | POA: Diagnosis not present

## 2019-07-10 DIAGNOSIS — Z992 Dependence on renal dialysis: Secondary | ICD-10-CM | POA: Diagnosis not present

## 2019-07-10 DIAGNOSIS — D509 Iron deficiency anemia, unspecified: Secondary | ICD-10-CM | POA: Diagnosis not present

## 2019-07-10 DIAGNOSIS — R52 Pain, unspecified: Secondary | ICD-10-CM | POA: Diagnosis not present

## 2019-07-10 DIAGNOSIS — N2581 Secondary hyperparathyroidism of renal origin: Secondary | ICD-10-CM | POA: Diagnosis not present

## 2019-07-13 DIAGNOSIS — L299 Pruritus, unspecified: Secondary | ICD-10-CM | POA: Diagnosis not present

## 2019-07-13 DIAGNOSIS — N186 End stage renal disease: Secondary | ICD-10-CM | POA: Diagnosis not present

## 2019-07-13 DIAGNOSIS — D689 Coagulation defect, unspecified: Secondary | ICD-10-CM | POA: Diagnosis not present

## 2019-07-13 DIAGNOSIS — R52 Pain, unspecified: Secondary | ICD-10-CM | POA: Diagnosis not present

## 2019-07-13 DIAGNOSIS — Z992 Dependence on renal dialysis: Secondary | ICD-10-CM | POA: Diagnosis not present

## 2019-07-13 DIAGNOSIS — E1129 Type 2 diabetes mellitus with other diabetic kidney complication: Secondary | ICD-10-CM | POA: Diagnosis not present

## 2019-07-13 DIAGNOSIS — D509 Iron deficiency anemia, unspecified: Secondary | ICD-10-CM | POA: Diagnosis not present

## 2019-07-13 DIAGNOSIS — N2581 Secondary hyperparathyroidism of renal origin: Secondary | ICD-10-CM | POA: Diagnosis not present

## 2019-07-15 DIAGNOSIS — N2581 Secondary hyperparathyroidism of renal origin: Secondary | ICD-10-CM | POA: Diagnosis not present

## 2019-07-15 DIAGNOSIS — N186 End stage renal disease: Secondary | ICD-10-CM | POA: Diagnosis not present

## 2019-07-15 DIAGNOSIS — D509 Iron deficiency anemia, unspecified: Secondary | ICD-10-CM | POA: Diagnosis not present

## 2019-07-15 DIAGNOSIS — D689 Coagulation defect, unspecified: Secondary | ICD-10-CM | POA: Diagnosis not present

## 2019-07-15 DIAGNOSIS — Z992 Dependence on renal dialysis: Secondary | ICD-10-CM | POA: Diagnosis not present

## 2019-07-15 DIAGNOSIS — L299 Pruritus, unspecified: Secondary | ICD-10-CM | POA: Diagnosis not present

## 2019-07-15 DIAGNOSIS — R52 Pain, unspecified: Secondary | ICD-10-CM | POA: Diagnosis not present

## 2019-07-15 DIAGNOSIS — E1129 Type 2 diabetes mellitus with other diabetic kidney complication: Secondary | ICD-10-CM | POA: Diagnosis not present

## 2019-07-17 DIAGNOSIS — D509 Iron deficiency anemia, unspecified: Secondary | ICD-10-CM | POA: Diagnosis not present

## 2019-07-17 DIAGNOSIS — N2581 Secondary hyperparathyroidism of renal origin: Secondary | ICD-10-CM | POA: Diagnosis not present

## 2019-07-17 DIAGNOSIS — E1129 Type 2 diabetes mellitus with other diabetic kidney complication: Secondary | ICD-10-CM | POA: Diagnosis not present

## 2019-07-17 DIAGNOSIS — Z992 Dependence on renal dialysis: Secondary | ICD-10-CM | POA: Diagnosis not present

## 2019-07-17 DIAGNOSIS — D689 Coagulation defect, unspecified: Secondary | ICD-10-CM | POA: Diagnosis not present

## 2019-07-17 DIAGNOSIS — R52 Pain, unspecified: Secondary | ICD-10-CM | POA: Diagnosis not present

## 2019-07-17 DIAGNOSIS — L299 Pruritus, unspecified: Secondary | ICD-10-CM | POA: Diagnosis not present

## 2019-07-17 DIAGNOSIS — N186 End stage renal disease: Secondary | ICD-10-CM | POA: Diagnosis not present

## 2019-07-20 DIAGNOSIS — L299 Pruritus, unspecified: Secondary | ICD-10-CM | POA: Diagnosis not present

## 2019-07-20 DIAGNOSIS — D689 Coagulation defect, unspecified: Secondary | ICD-10-CM | POA: Diagnosis not present

## 2019-07-20 DIAGNOSIS — Z992 Dependence on renal dialysis: Secondary | ICD-10-CM | POA: Diagnosis not present

## 2019-07-20 DIAGNOSIS — R52 Pain, unspecified: Secondary | ICD-10-CM | POA: Diagnosis not present

## 2019-07-20 DIAGNOSIS — N2581 Secondary hyperparathyroidism of renal origin: Secondary | ICD-10-CM | POA: Diagnosis not present

## 2019-07-20 DIAGNOSIS — N186 End stage renal disease: Secondary | ICD-10-CM | POA: Diagnosis not present

## 2019-07-20 DIAGNOSIS — E1129 Type 2 diabetes mellitus with other diabetic kidney complication: Secondary | ICD-10-CM | POA: Diagnosis not present

## 2019-07-20 DIAGNOSIS — D509 Iron deficiency anemia, unspecified: Secondary | ICD-10-CM | POA: Diagnosis not present

## 2019-07-23 DIAGNOSIS — B49 Unspecified mycosis: Secondary | ICD-10-CM | POA: Diagnosis not present

## 2019-07-23 DIAGNOSIS — N186 End stage renal disease: Secondary | ICD-10-CM | POA: Diagnosis not present

## 2019-07-23 DIAGNOSIS — Z992 Dependence on renal dialysis: Secondary | ICD-10-CM | POA: Diagnosis not present

## 2019-07-23 DIAGNOSIS — E1122 Type 2 diabetes mellitus with diabetic chronic kidney disease: Secondary | ICD-10-CM | POA: Diagnosis not present

## 2019-07-24 DIAGNOSIS — D689 Coagulation defect, unspecified: Secondary | ICD-10-CM | POA: Diagnosis not present

## 2019-07-24 DIAGNOSIS — Z992 Dependence on renal dialysis: Secondary | ICD-10-CM | POA: Diagnosis not present

## 2019-07-24 DIAGNOSIS — R52 Pain, unspecified: Secondary | ICD-10-CM | POA: Diagnosis not present

## 2019-07-24 DIAGNOSIS — L299 Pruritus, unspecified: Secondary | ICD-10-CM | POA: Diagnosis not present

## 2019-07-24 DIAGNOSIS — N2581 Secondary hyperparathyroidism of renal origin: Secondary | ICD-10-CM | POA: Diagnosis not present

## 2019-07-24 DIAGNOSIS — D631 Anemia in chronic kidney disease: Secondary | ICD-10-CM | POA: Diagnosis not present

## 2019-07-24 DIAGNOSIS — N186 End stage renal disease: Secondary | ICD-10-CM | POA: Diagnosis not present

## 2019-07-24 DIAGNOSIS — E1129 Type 2 diabetes mellitus with other diabetic kidney complication: Secondary | ICD-10-CM | POA: Diagnosis not present

## 2019-07-26 ENCOUNTER — Emergency Department (HOSPITAL_COMMUNITY): Payer: Medicare Other

## 2019-07-26 ENCOUNTER — Inpatient Hospital Stay (HOSPITAL_COMMUNITY)
Admission: EM | Admit: 2019-07-26 | Discharge: 2019-07-31 | DRG: 438 | Disposition: A | Discharge status: Home / Self Care | Payer: Medicare Other | Attending: Internal Medicine | Admitting: Internal Medicine

## 2019-07-26 ENCOUNTER — Encounter (HOSPITAL_COMMUNITY): Payer: Self-pay

## 2019-07-26 ENCOUNTER — Other Ambulatory Visit: Payer: Self-pay

## 2019-07-26 DIAGNOSIS — E875 Hyperkalemia: Secondary | ICD-10-CM | POA: Diagnosis not present

## 2019-07-26 DIAGNOSIS — Z9115 Patient's noncompliance with renal dialysis: Secondary | ICD-10-CM | POA: Diagnosis not present

## 2019-07-26 DIAGNOSIS — K529 Noninfective gastroenteritis and colitis, unspecified: Secondary | ICD-10-CM | POA: Diagnosis not present

## 2019-07-26 DIAGNOSIS — Z841 Family history of disorders of kidney and ureter: Secondary | ICD-10-CM | POA: Diagnosis not present

## 2019-07-26 DIAGNOSIS — Z880 Allergy status to penicillin: Secondary | ICD-10-CM

## 2019-07-26 DIAGNOSIS — K859 Acute pancreatitis without necrosis or infection, unspecified: Principal | ICD-10-CM | POA: Diagnosis present

## 2019-07-26 DIAGNOSIS — E1122 Type 2 diabetes mellitus with diabetic chronic kidney disease: Secondary | ICD-10-CM | POA: Diagnosis not present

## 2019-07-26 DIAGNOSIS — Z87891 Personal history of nicotine dependence: Secondary | ICD-10-CM

## 2019-07-26 DIAGNOSIS — E1129 Type 2 diabetes mellitus with other diabetic kidney complication: Secondary | ICD-10-CM | POA: Diagnosis not present

## 2019-07-26 DIAGNOSIS — E1165 Type 2 diabetes mellitus with hyperglycemia: Secondary | ICD-10-CM | POA: Diagnosis not present

## 2019-07-26 DIAGNOSIS — Z66 Do not resuscitate: Secondary | ICD-10-CM | POA: Diagnosis not present

## 2019-07-26 DIAGNOSIS — R109 Unspecified abdominal pain: Secondary | ICD-10-CM | POA: Diagnosis not present

## 2019-07-26 DIAGNOSIS — K219 Gastro-esophageal reflux disease without esophagitis: Secondary | ICD-10-CM | POA: Diagnosis present

## 2019-07-26 DIAGNOSIS — I120 Hypertensive chronic kidney disease with stage 5 chronic kidney disease or end stage renal disease: Secondary | ICD-10-CM | POA: Diagnosis present

## 2019-07-26 DIAGNOSIS — F209 Schizophrenia, unspecified: Secondary | ICD-10-CM | POA: Diagnosis present

## 2019-07-26 DIAGNOSIS — Z992 Dependence on renal dialysis: Secondary | ICD-10-CM

## 2019-07-26 DIAGNOSIS — D631 Anemia in chronic kidney disease: Secondary | ICD-10-CM | POA: Diagnosis not present

## 2019-07-26 DIAGNOSIS — E8770 Fluid overload, unspecified: Secondary | ICD-10-CM | POA: Diagnosis present

## 2019-07-26 DIAGNOSIS — Z20822 Contact with and (suspected) exposure to covid-19: Secondary | ICD-10-CM | POA: Diagnosis present

## 2019-07-26 DIAGNOSIS — E785 Hyperlipidemia, unspecified: Secondary | ICD-10-CM | POA: Diagnosis present

## 2019-07-26 DIAGNOSIS — J810 Acute pulmonary edema: Secondary | ICD-10-CM | POA: Diagnosis not present

## 2019-07-26 DIAGNOSIS — N186 End stage renal disease: Secondary | ICD-10-CM | POA: Diagnosis present

## 2019-07-26 DIAGNOSIS — Z79899 Other long term (current) drug therapy: Secondary | ICD-10-CM

## 2019-07-26 DIAGNOSIS — R0602 Shortness of breath: Secondary | ICD-10-CM | POA: Diagnosis not present

## 2019-07-26 DIAGNOSIS — E1142 Type 2 diabetes mellitus with diabetic polyneuropathy: Secondary | ICD-10-CM | POA: Diagnosis present

## 2019-07-26 DIAGNOSIS — K861 Other chronic pancreatitis: Secondary | ICD-10-CM

## 2019-07-26 DIAGNOSIS — Z888 Allergy status to other drugs, medicaments and biological substances status: Secondary | ICD-10-CM

## 2019-07-26 DIAGNOSIS — N2581 Secondary hyperparathyroidism of renal origin: Secondary | ICD-10-CM | POA: Diagnosis not present

## 2019-07-26 LAB — COMPREHENSIVE METABOLIC PANEL
ALT: 12 U/L (ref 0–44)
AST: 8 U/L — ABNORMAL LOW (ref 15–41)
Albumin: 4.4 g/dL (ref 3.5–5.0)
Alkaline Phosphatase: 68 U/L (ref 38–126)
Anion gap: 18 — ABNORMAL HIGH (ref 5–15)
BUN: 94 mg/dL — ABNORMAL HIGH (ref 8–23)
CO2: 15 mmol/L — ABNORMAL LOW (ref 22–32)
Calcium: 8.8 mg/dL — ABNORMAL LOW (ref 8.9–10.3)
Chloride: 103 mmol/L (ref 98–111)
Creatinine, Ser: 19.29 mg/dL — ABNORMAL HIGH (ref 0.61–1.24)
GFR calc Af Amer: 3 mL/min — ABNORMAL LOW (ref >60)
GFR calc non Af Amer: 2 mL/min — ABNORMAL LOW (ref >60)
Glucose, Bld: 98 mg/dL (ref 70–99)
Potassium: 6.6 mmol/L (ref 3.5–5.1)
Sodium: 136 mmol/L (ref 135–145)
Total Bilirubin: 0.6 mg/dL (ref 0.3–1.2)
Total Protein: 8.6 g/dL — ABNORMAL HIGH (ref 6.5–8.1)

## 2019-07-26 LAB — CBC
HCT: 34.4 % — ABNORMAL LOW (ref 39.0–52.0)
Hemoglobin: 11.4 g/dL — ABNORMAL LOW (ref 13.0–17.0)
MCH: 32 pg (ref 26.0–34.0)
MCHC: 33.1 g/dL (ref 30.0–36.0)
MCV: 96.6 fL (ref 80.0–100.0)
Platelets: 244 10*3/uL (ref 150–400)
RBC: 3.56 MIL/uL — ABNORMAL LOW (ref 4.22–5.81)
RDW: 16.5 % — ABNORMAL HIGH (ref 11.5–15.5)
WBC: 8.9 10*3/uL (ref 4.0–10.5)
nRBC: 0 % (ref 0.0–0.2)

## 2019-07-26 LAB — LIPASE, BLOOD: Lipase: 137 U/L — ABNORMAL HIGH (ref 11–51)

## 2019-07-26 MED ORDER — SODIUM ZIRCONIUM CYCLOSILICATE 10 G PO PACK
10.0000 g | PACK | Freq: Once | ORAL | Status: DC
  Filled 2019-07-26: qty 1

## 2019-07-26 MED ORDER — SODIUM ZIRCONIUM CYCLOSILICATE 10 G PO PACK
10.0000 g | PACK | Freq: Three times a day (TID) | ORAL | Status: DC
  Administered 2019-07-26 – 2019-07-31 (×12): 10 g via ORAL
  Filled 2019-07-26 (×11): qty 1

## 2019-07-26 MED ORDER — FENTANYL CITRATE (PF) 100 MCG/2ML IJ SOLN
25.0000 ug | INTRAMUSCULAR | Status: DC | PRN
  Administered 2019-07-27: 04:00:00 25 ug via INTRAVENOUS
  Filled 2019-07-26: qty 2

## 2019-07-26 MED ORDER — FENTANYL CITRATE (PF) 100 MCG/2ML IJ SOLN
100.0000 ug | Freq: Once | INTRAMUSCULAR | Status: AC
  Administered 2019-07-26: 100 ug via INTRAVENOUS
  Filled 2019-07-26: qty 2

## 2019-07-26 MED ORDER — ONDANSETRON HCL 4 MG/2ML IJ SOLN
4.0000 mg | Freq: Once | INTRAMUSCULAR | Status: AC
  Administered 2019-07-26: 4 mg via INTRAVENOUS
  Filled 2019-07-26: qty 2

## 2019-07-26 NOTE — ED Triage Notes (Signed)
Pt reports sob, epigastric pain and emesis for the past week. Last dialysis was last Tuesday, pt unable to get dialysis last thurs or sat due to dialysis center telling him he has a clot in his fistula. resp e.u at this time.
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
## 2019-07-27 ENCOUNTER — Inpatient Hospital Stay (HOSPITAL_COMMUNITY): Payer: Medicare Other

## 2019-07-27 ENCOUNTER — Encounter (HOSPITAL_COMMUNITY): Payer: Self-pay | Admitting: Internal Medicine

## 2019-07-27 DIAGNOSIS — N186 End stage renal disease: Secondary | ICD-10-CM

## 2019-07-27 DIAGNOSIS — K859 Acute pancreatitis without necrosis or infection, unspecified: Principal | ICD-10-CM

## 2019-07-27 DIAGNOSIS — E875 Hyperkalemia: Secondary | ICD-10-CM | POA: Insufficient documentation

## 2019-07-27 LAB — CBC WITH DIFFERENTIAL/PLATELET
Abs Immature Granulocytes: 0.04 10*3/uL (ref 0.00–0.07)
Basophils Absolute: 0.1 10*3/uL (ref 0.0–0.1)
Basophils Relative: 1 %
Eosinophils Absolute: 0.7 10*3/uL — ABNORMAL HIGH (ref 0.0–0.5)
Eosinophils Relative: 9 %
HCT: 34 % — ABNORMAL LOW (ref 39.0–52.0)
Hemoglobin: 11.2 g/dL — ABNORMAL LOW (ref 13.0–17.0)
Immature Granulocytes: 1 %
Lymphocytes Relative: 16 %
Lymphs Abs: 1.2 10*3/uL (ref 0.7–4.0)
MCH: 32.1 pg (ref 26.0–34.0)
MCHC: 32.9 g/dL (ref 30.0–36.0)
MCV: 97.4 fL (ref 80.0–100.0)
Monocytes Absolute: 0.6 10*3/uL (ref 0.1–1.0)
Monocytes Relative: 8 %
Neutro Abs: 5 10*3/uL (ref 1.7–7.7)
Neutrophils Relative %: 65 %
Platelets: 225 10*3/uL (ref 150–400)
RBC: 3.49 MIL/uL — ABNORMAL LOW (ref 4.22–5.81)
RDW: 16.8 % — ABNORMAL HIGH (ref 11.5–15.5)
WBC: 7.5 10*3/uL (ref 4.0–10.5)
nRBC: 0 % (ref 0.0–0.2)

## 2019-07-27 LAB — BASIC METABOLIC PANEL
Anion gap: 20 — ABNORMAL HIGH (ref 5–15)
BUN: 100 mg/dL — ABNORMAL HIGH (ref 8–23)
CO2: 14 mmol/L — ABNORMAL LOW (ref 22–32)
Calcium: 8.6 mg/dL — ABNORMAL LOW (ref 8.9–10.3)
Chloride: 101 mmol/L (ref 98–111)
Creatinine, Ser: 19.77 mg/dL — ABNORMAL HIGH (ref 0.61–1.24)
GFR calc Af Amer: 3 mL/min — ABNORMAL LOW (ref >60)
GFR calc non Af Amer: 2 mL/min — ABNORMAL LOW (ref >60)
Glucose, Bld: 94 mg/dL (ref 70–99)
Potassium: 6.4 mmol/L (ref 3.5–5.1)
Sodium: 135 mmol/L (ref 135–145)

## 2019-07-27 LAB — HEPATIC FUNCTION PANEL
ALT: 13 U/L (ref 0–44)
AST: 13 U/L — ABNORMAL LOW (ref 15–41)
Albumin: 4 g/dL (ref 3.5–5.0)
Alkaline Phosphatase: 65 U/L (ref 38–126)
Bilirubin, Direct: 0.3 mg/dL — ABNORMAL HIGH (ref 0.0–0.2)
Indirect Bilirubin: 0.6 mg/dL (ref 0.3–0.9)
Total Bilirubin: 0.9 mg/dL (ref 0.3–1.2)
Total Protein: 7.7 g/dL (ref 6.5–8.1)

## 2019-07-27 LAB — RESPIRATORY PANEL BY RT PCR (FLU A&B, COVID)
Influenza A by PCR: NEGATIVE
Influenza B by PCR: NEGATIVE
SARS Coronavirus 2 by RT PCR: NEGATIVE

## 2019-07-27 LAB — TRIGLYCERIDES: Triglycerides: 278 mg/dL — ABNORMAL HIGH (ref <150)

## 2019-07-27 LAB — HIV ANTIBODY (ROUTINE TESTING W REFLEX): HIV Screen 4th Generation wRfx: NONREACTIVE

## 2019-07-27 LAB — GLUCOSE, CAPILLARY: Glucose-Capillary: 95 mg/dL (ref 70–99)

## 2019-07-27 LAB — TROPONIN I (HIGH SENSITIVITY): Troponin I (High Sensitivity): 15 ng/L (ref <18)

## 2019-07-27 LAB — CBG MONITORING, ED: Glucose-Capillary: 87 mg/dL (ref 70–99)

## 2019-07-27 LAB — POTASSIUM: Potassium: 3.8 mmol/L (ref 3.5–5.1)

## 2019-07-27 MED ORDER — HYDROMORPHONE HCL 1 MG/ML IJ SOLN
1.0000 mg | INTRAMUSCULAR | Status: DC | PRN
  Administered 2019-07-27 – 2019-07-29 (×7): 1 mg via INTRAVENOUS
  Filled 2019-07-27 (×6): qty 1

## 2019-07-27 MED ORDER — ONDANSETRON HCL 4 MG/2ML IJ SOLN
INTRAMUSCULAR | Status: AC
  Filled 2019-07-27: qty 2

## 2019-07-27 MED ORDER — HEPARIN SODIUM (PORCINE) 1000 UNIT/ML DIALYSIS: 2600.0000 [IU] | Freq: Once | INTRAMUSCULAR | Status: AC

## 2019-07-27 MED ORDER — GABAPENTIN 300 MG PO CAPS
300.0000 mg | ORAL_CAPSULE | ORAL | Status: DC
  Administered 2019-07-27 – 2019-07-29 (×2): 300 mg via ORAL
  Filled 2019-07-27 (×2): qty 1

## 2019-07-27 MED ORDER — ACETAMINOPHEN 325 MG PO TABS
ORAL_TABLET | ORAL | Status: AC
  Filled 2019-07-27: qty 2

## 2019-07-27 MED ORDER — HEPARIN SODIUM (PORCINE) 5000 UNIT/ML IJ SOLN
5000.0000 [IU] | Freq: Three times a day (TID) | INTRAMUSCULAR | Status: DC
  Administered 2019-07-27 – 2019-07-31 (×10): 5000 [IU] via SUBCUTANEOUS
  Filled 2019-07-27 (×11): qty 1

## 2019-07-27 MED ORDER — HYDRALAZINE HCL 20 MG/ML IJ SOLN: 10.0000 mg | INTRAMUSCULAR | Status: DC | PRN

## 2019-07-27 MED ORDER — AMLODIPINE BESYLATE 10 MG PO TABS
10.0000 mg | ORAL_TABLET | Freq: Every day | ORAL | Status: DC
  Administered 2019-07-27 – 2019-07-30 (×5): 10 mg via ORAL
  Filled 2019-07-27: qty 1
  Filled 2019-07-27: qty 2
  Filled 2019-07-27 (×4): qty 1

## 2019-07-27 MED ORDER — RENA-VITE PO TABS
1.0000 | ORAL_TABLET | Freq: Every day | ORAL | Status: DC
  Administered 2019-07-27 – 2019-07-30 (×4): 1 via ORAL
  Filled 2019-07-27 (×5): qty 1

## 2019-07-27 MED ORDER — PROCHLORPERAZINE EDISYLATE 10 MG/2ML IJ SOLN
10.0000 mg | INTRAMUSCULAR | Status: DC | PRN
  Administered 2019-07-28 – 2019-07-30 (×3): 10 mg via INTRAVENOUS
  Filled 2019-07-27 (×3): qty 2

## 2019-07-27 MED ORDER — ONDANSETRON HCL 4 MG PO TABS: 4.0000 mg | ORAL_TABLET | Freq: Four times a day (QID) | ORAL | Status: DC | PRN

## 2019-07-27 MED ORDER — ONDANSETRON HCL 4 MG/2ML IJ SOLN
4.0000 mg | Freq: Four times a day (QID) | INTRAMUSCULAR | Status: DC | PRN
  Administered 2019-07-27 – 2019-07-29 (×6): 4 mg via INTRAVENOUS
  Filled 2019-07-27 (×5): qty 2

## 2019-07-27 MED ORDER — CHLORHEXIDINE GLUCONATE CLOTH 2 % EX PADS
6.0000 | MEDICATED_PAD | Freq: Every day | CUTANEOUS | Status: DC
  Administered 2019-07-28 – 2019-07-29 (×2): 6 via TOPICAL

## 2019-07-27 MED ORDER — ACETAMINOPHEN 325 MG PO TABS
650.0000 mg | ORAL_TABLET | Freq: Four times a day (QID) | ORAL | Status: DC | PRN
  Administered 2019-07-27: 650 mg via ORAL

## 2019-07-27 NOTE — Procedures (Signed)
   I was present at this dialysis session, have reviewed the session itself and made  appropriate changes Kelly Splinter MD [place] pager [phone]   07/27/2019, 3:35 PM

## 2019-07-27 NOTE — Progress Notes (Signed)
Hemodialysis- Mild difficulty cannulating venous needle. First cannulation clots noted. Patient states this is why they could not do his dialysis this week. Second attempt successful. BFR currently 450 with AP -190 VP220
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
## 2019-07-27 NOTE — ED Notes (Signed)
Lunch Tray Ordered @ 1033. 

## 2019-07-27 NOTE — ED Notes (Signed)
Breakfast Ordered 
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
## 2019-07-27 NOTE — ED Notes (Signed)
Pt transported to HD.

## 2019-07-28 DIAGNOSIS — E875 Hyperkalemia: Secondary | ICD-10-CM

## 2019-07-28 LAB — COMPREHENSIVE METABOLIC PANEL
ALT: 11 U/L (ref 0–44)
AST: 10 U/L — ABNORMAL LOW (ref 15–41)
Albumin: 3.7 g/dL (ref 3.5–5.0)
Alkaline Phosphatase: 70 U/L (ref 38–126)
Anion gap: 17 — ABNORMAL HIGH (ref 5–15)
BUN: 55 mg/dL — ABNORMAL HIGH (ref 8–23)
CO2: 24 mmol/L (ref 22–32)
Calcium: 8.9 mg/dL (ref 8.9–10.3)
Chloride: 94 mmol/L — ABNORMAL LOW (ref 98–111)
Creatinine, Ser: 13.66 mg/dL — ABNORMAL HIGH (ref 0.61–1.24)
GFR calc Af Amer: 4 mL/min — ABNORMAL LOW (ref >60)
GFR calc non Af Amer: 3 mL/min — ABNORMAL LOW (ref >60)
Glucose, Bld: 123 mg/dL — ABNORMAL HIGH (ref 70–99)
Potassium: 4.3 mmol/L (ref 3.5–5.1)
Sodium: 135 mmol/L (ref 135–145)
Total Bilirubin: 1 mg/dL (ref 0.3–1.2)
Total Protein: 7.8 g/dL (ref 6.5–8.1)

## 2019-07-28 LAB — LIPASE, BLOOD: Lipase: 132 U/L — ABNORMAL HIGH (ref 11–51)

## 2019-07-28 LAB — GLUCOSE, CAPILLARY
Glucose-Capillary: 104 mg/dL — ABNORMAL HIGH (ref 70–99)
Glucose-Capillary: 110 mg/dL — ABNORMAL HIGH (ref 70–99)
Glucose-Capillary: 113 mg/dL — ABNORMAL HIGH (ref 70–99)
Glucose-Capillary: 130 mg/dL — ABNORMAL HIGH (ref 70–99)
Glucose-Capillary: 98 mg/dL (ref 70–99)

## 2019-07-28 MED ORDER — CHLORHEXIDINE GLUCONATE CLOTH 2 % EX PADS
6.0000 | MEDICATED_PAD | Freq: Every day | CUTANEOUS | Status: DC
  Administered 2019-07-30: 6 via TOPICAL

## 2019-07-28 NOTE — Plan of Care (Signed)
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
## 2019-07-28 NOTE — Plan of Care (Signed)

## 2019-07-29 LAB — COMPREHENSIVE METABOLIC PANEL
ALT: 10 U/L (ref 0–44)
AST: 8 U/L — ABNORMAL LOW (ref 15–41)
Albumin: 3.7 g/dL (ref 3.5–5.0)
Alkaline Phosphatase: 58 U/L (ref 38–126)
Anion gap: 14 (ref 5–15)
BUN: 65 mg/dL — ABNORMAL HIGH (ref 8–23)
CO2: 24 mmol/L (ref 22–32)
Calcium: 8.1 mg/dL — ABNORMAL LOW (ref 8.9–10.3)
Chloride: 97 mmol/L — ABNORMAL LOW (ref 98–111)
Creatinine, Ser: 15.52 mg/dL — ABNORMAL HIGH (ref 0.61–1.24)
GFR calc Af Amer: 3 mL/min — ABNORMAL LOW (ref >60)
GFR calc non Af Amer: 3 mL/min — ABNORMAL LOW (ref >60)
Glucose, Bld: 108 mg/dL — ABNORMAL HIGH (ref 70–99)
Potassium: 3.6 mmol/L (ref 3.5–5.1)
Sodium: 135 mmol/L (ref 135–145)
Total Bilirubin: 0.8 mg/dL (ref 0.3–1.2)
Total Protein: 7.7 g/dL (ref 6.5–8.1)

## 2019-07-29 LAB — CBC
HCT: 30.6 % — ABNORMAL LOW (ref 39.0–52.0)
Hemoglobin: 10.2 g/dL — ABNORMAL LOW (ref 13.0–17.0)
MCH: 31.1 pg (ref 26.0–34.0)
MCHC: 33.3 g/dL (ref 30.0–36.0)
MCV: 93.3 fL (ref 80.0–100.0)
Platelets: 219 10*3/uL (ref 150–400)
RBC: 3.28 MIL/uL — ABNORMAL LOW (ref 4.22–5.81)
RDW: 15.8 % — ABNORMAL HIGH (ref 11.5–15.5)
WBC: 5 10*3/uL (ref 4.0–10.5)
nRBC: 0 % (ref 0.0–0.2)

## 2019-07-29 LAB — LIPASE, BLOOD: Lipase: 128 U/L — ABNORMAL HIGH (ref 11–51)

## 2019-07-29 LAB — GLUCOSE, CAPILLARY
Glucose-Capillary: 102 mg/dL — ABNORMAL HIGH (ref 70–99)
Glucose-Capillary: 106 mg/dL — ABNORMAL HIGH (ref 70–99)
Glucose-Capillary: 93 mg/dL (ref 70–99)

## 2019-07-29 LAB — PHOSPHORUS: Phosphorus: 8.7 mg/dL — ABNORMAL HIGH (ref 2.5–4.6)

## 2019-07-29 MED ORDER — HEPARIN SODIUM (PORCINE) 1000 UNIT/ML IJ SOLN
INTRAMUSCULAR | Status: AC
  Administered 2019-07-29: 2600 [IU] via INTRAVENOUS_CENTRAL
  Filled 2019-07-29: qty 3

## 2019-07-29 MED ORDER — MELATONIN 3 MG PO TABS
6.0000 mg | ORAL_TABLET | Freq: Every evening | ORAL | Status: DC | PRN
  Administered 2019-07-29: 6 mg via ORAL
  Filled 2019-07-29: qty 2

## 2019-07-29 MED ORDER — HYDROMORPHONE HCL 1 MG/ML IJ SOLN
INTRAMUSCULAR | Status: AC
  Filled 2019-07-29: qty 1

## 2019-07-29 NOTE — Progress Notes (Signed)
Patient returned from HD. Patient is alert and oriented times 4. Belongings and call bell within reach. Patient is lethargic after dialysis but easily aroused. Bed rails up times 3. Patient c/o of feeling weak and denies nutrition.

## 2019-07-29 NOTE — Plan of Care (Signed)

## 2019-07-29 NOTE — Progress Notes (Signed)
[facility] Progress Note   Subjective:   Patient seen and examined at bedside.  Reports feeling better today.  No n/v this AM, abdominal pain improving.  Denies SOB, CP, weakness, dizziness and fatigue.   Objective Vitals:   07/29/19 0450 07/29/19 0500 07/29/19 1222 07/29/19 1243  BP: 129/82  (!) (P) 144/84 (!) 139/108  Pulse: 79  (P) 76 74  Resp: 15  (P) 18   Temp: 98.5 F (36.9 C)  (!) (P) 97.5 F (36.4 C)   TempSrc: Oral  (P) Oral   SpO2: 100%  (P) 98%   Weight:  111.2 kg    Height:       Physical Exam General:NAD, WDWN male, laying in bed Heart:RRR Lungs:CTAB, nml WOB Abdomen:soft, NTND, +BS Extremities:trace LE edema Dialysis Access: LU AVF +b  Filed Weights   07/27/19 1524 07/28/19 0500 07/29/19 0500  Weight: 110.8 kg 111 kg 111.2 kg    Intake/Output Summary (Last 24 hours) at 07/29/2019 1311 Last data filed at 07/29/2019 0526 Gross per 24 hour  Intake 260 ml  Output --  Net 260 ml    Additional Objective Labs: Basic Metabolic Panel: Recent Labs  Lab 07/27/19 0532 07/27/19 0532 07/27/19 1832 07/28/19 0246 07/29/19 0200  NA 135  --   --  135 135  K 6.4*   < > 3.8 4.3 3.6  CL 101  --   --  94* 97*  CO2 14*  --   --  24 24  GLUCOSE 94  --   --  123* 108*  BUN 100*  --   --  55* 65*  CREATININE 19.77*  --   --  13.66* 15.52*  CALCIUM 8.6*  --   --  8.9 8.1*   < > = values in this interval not displayed.   Liver Function Tests: Recent Labs  Lab 07/27/19 0532 07/28/19 0246 07/29/19 0200  AST 13* 10* 8*  ALT 13 11 10   ALKPHOS 65 70 58  BILITOT 0.9 1.0 0.8  PROT 7.7 7.8 7.7  ALBUMIN 4.0 3.7 3.7   Recent Labs  Lab 07/26/19 2150 07/28/19 0246 07/29/19 0200  LIPASE 137* 132* 128*   CBC: Recent Labs  Lab 07/26/19 2150 07/27/19 0532 07/29/19 1247  WBC 8.9 7.5 5.0  NEUTROABS  --  5.0  --   HGB 11.4* 11.2* 10.2*  HCT 34.4* 34.0* 30.6*  MCV 96.6 97.4 93.3  PLT 244 225 219   Blood Culture    Component Value Date/Time    SDES URINE, RANDOM 11/17/2017 0707   SPECREQUEST NONE 11/17/2017 0707   CULT  11/17/2017 0707    NO GROWTH Performed at [facility], [place] [address], [place], [place] [address]    REPTSTATUS 11/18/2017 FINAL 11/17/2017 0707   CBG: Recent Labs  Lab 07/28/19 0744 07/28/19 1202 07/28/19 1754 07/29/19 0040 07/29/19 0550  GLUCAP 113* 110* 98 106* 102*    Medications:  . amLODipine  10 mg Oral QHS  . Chlorhexidine Gluconate Cloth  6 each Topical Q0600  . Chlorhexidine Gluconate Cloth  6 each Topical Q0600  . gabapentin  300 mg Oral Q T,Th,Sat-1800  . heparin  5,000 Units Subcutaneous Q8H  . multivitamin  1 tablet Oral Daily  . sodium zirconium cyclosilicate  10 g Oral TID    Dialysis Orders: [facility] -[facility] 4.5hrs, [id number], [id number], [id number]  Access:LU AVF +b/t Heparin2600 Calcitriol1.54mcg PO qHD  Assessment/Plan: 1. Hyperkalemia- resolved with HD, K 3.6 today. 2. SOB- resolved  with HD. Does not appear volume overloaded.  3. Epigastric pain/n/v- ?acute pancreatitis, uremia likely contributed. Improving. Per primary 4. Non compliance with HD- missed last 2 HD, says issued w/clotted AVF but bruit heard, noted that treatment not completed d/t clotted access but no notes about plan.  Stressed importance of compliance with HD.  5. ESRD- On HD TTS. Orders written for HD today per regular schedule.  6. Hypertension- BP well controlled. Monitor.  7. Anemiaof CKD- Hgb 10.2. No indication for ESA at this time. Cont to follow.  8. Secondary Hyperparathyroidism -Ca close to goal. Checking phos. Continue VDRA and binders.  9. Nutrition- Renal diet w/fluid restrictions 10. DMT2 - per primary   Jen Mow, PA-C [facility] Pager: [phone] 07/29/2019,1:11 PM  LOS: 3 days

## 2019-07-29 NOTE — Progress Notes (Signed)
PROGRESS NOTE    Jonathan Mcneil  WIO:[id number] DOB: 1956/01/27 DOA: 07/26/2019 PCP: [place], [place]    Chief Complaint  Patient presents with  . Shortness of Breath  . Abdominal Pain    Brief Narrative:   64 year old male with prior history of end-stage renal disease on dialysis, essential hypertension, type 2 diabetes mellitus, schizophrenia presents to ED with shortness of breath and epigastric pain with nausea and vomiting.  He also reports that he missed 2 sessions of dialysis.  Pt seen and examined, he reports pain is 50% improved,still nauseated, no vomiting .    Assessment & Plan:   Principal Problem:   Acute pancreatitis Active Problems:   ESRD (end stage renal disease) (HCC)   Abdominal pain associated with nausea and vomiting probably secondary to acute pancreatitis : Differential includes gastroenteritis. Probably idiopathic, still has tenderness on abdominal exam.  CT of the abdomen pelvis does not show any gallstones or signs of necrosis.  Patient denies taking any alcohol. Triglyceride level at 278.  Improving lipase levels Symptomatic management with bowel rest, pain control and antiemetics. Continue the same regimen for another 24 hours and advance diet as tolerated.    End-stage renal disease on dialysis with hyperkalemia Hyperkalemia resolved. Nephrology on board, HD today.      Type 2 diabetes mellitus with hyperglycemia CBG (last 3)  Recent Labs    07/28/19 1754 07/29/19 0040 07/29/19 0550  GLUCAP 98 106* 102*   Resume sliding scale insulin. No changes in meds.   Essential hypertension Well-controlled continue with amlodipine.   Anemia of chronic disease Transfuse to keep hemoglobin greater than 7. Hemoglobin is stable around 10.2.     History of schizophrenia Not on any medications at this time.  Appears to be stable.  DVT prophylaxis Heparin Code Status: DNR Family Communication: None at  bedside Disposition:   Status is: Inpatient  Remains inpatient appropriate because:Ongoing active pain requiring inpatient pain management and IV treatments appropriate due to intensity of illness or inability to take PO   Dispo: The patient is from: Home              Anticipated d/c is to: Home              Anticipated d/c date is: 2 days              Patient currently is not medically stable to d/c.        Consultants:   Nephrology.    Procedures: none.   Antimicrobials: none.   Subjective: Improving abd pain but not resolved. No vomiting and persistent nausea.  No chest pain or sob.   Objective: Vitals:   07/29/19 1300 07/29/19 1330 07/29/19 1400 07/29/19 1430  BP: 134/84 (!) 151/83 129/78 131/77  Pulse: 74 77 79 86  Resp:      Temp:      TempSrc:      SpO2:      Weight:      Height:        Intake/Output Summary (Last 24 hours) at 07/29/2019 1442 Last data filed at 07/29/2019 0526 Gross per 24 hour  Intake 260 ml  Output --  Net 260 ml   Filed Weights   07/28/19 0500 07/29/19 0500 07/29/19 1222  Weight: 111 kg 111.2 kg 102.3 kg    Examination:  General exam:  Alert and comfortable.  Respiratory system: clear to auscultation, no wheezing or rhonchi.  Cardiovascular system:  S1S2 heard,  RRR, no JVD.  Gastrointestinal system: abd is soft, mildly tender in the epigastric area, no rebound tenderness, bowel sounds minimal.  Central nervous system: alert and oriented. Non focal.  Extremities: no cyanosis, or clubbing.  Skin: No rashes.  Psychiatry: mood is appropriate.     Data Reviewed: I have personally reviewed following labs and imaging studies  CBC: Recent Labs  Lab 07/26/19 2150 07/27/19 0532 07/29/19 1247  WBC 8.9 7.5 5.0  NEUTROABS  --  5.0  --   HGB 11.4* 11.2* 10.2*  HCT 34.4* 34.0* 30.6*  MCV 96.6 97.4 93.3  PLT 244 225 096    Basic Metabolic Panel: Recent Labs  Lab 07/26/19 2150 07/27/19 0532 07/27/19 1832 07/28/19 0246  07/29/19 0200 07/29/19 1405  NA 136 135  --  135 135  --   K 6.6* 6.4* 3.8 4.3 3.6  --   CL 103 101  --  94* 97*  --   CO2 15* 14*  --  24 24  --   GLUCOSE 98 94  --  123* 108*  --   BUN 94* 100*  --  55* 65*  --   CREATININE 19.29* 19.77*  --  13.66* 15.52*  --   CALCIUM 8.8* 8.6*  --  8.9 8.1*  --   PHOS  --   --   --   --   --  8.7*    GFR: Estimated Creatinine Clearance: 5.7 mL/min (A) (by C-G formula based on SCr of 15.52 mg/dL (H)).  Liver Function Tests: Recent Labs  Lab 07/26/19 2150 07/27/19 0532 07/28/19 0246 07/29/19 0200  AST 8* 13* 10* 8*  ALT 12 13 11 10   ALKPHOS 68 65 70 58  BILITOT 0.6 0.9 1.0 0.8  PROT 8.6* 7.7 7.8 7.7  ALBUMIN 4.4 4.0 3.7 3.7    CBG: Recent Labs  Lab 07/28/19 0744 07/28/19 1202 07/28/19 1754 07/29/19 0040 07/29/19 0550  GLUCAP 113* 110* 98 106* 102*     Recent Results (from the past 240 hour(s))  Respiratory Panel by RT PCR (Flu A&B, Covid) - Nasopharyngeal Swab     Status: None   Collection Time: 07/26/19 11:28 PM   Specimen: Nasopharyngeal Swab  Result Value Ref Range Status   SARS Coronavirus 2 by RT PCR NEGATIVE NEGATIVE Final    Comment: (NOTE) SARS-CoV-2 target nucleic acids are NOT DETECTED. The SARS-CoV-2 RNA is generally detectable in upper respiratoy specimens during the acute phase of infection. The lowest concentration of SARS-CoV-2 viral copies this assay can detect is 131 copies/mL. A negative result does not preclude SARS-Cov-2 infection and should not be used as the sole basis for treatment or other patient management decisions. A negative result may occur with  improper specimen collection/handling, submission of specimen other than nasopharyngeal swab, presence of viral mutation(s) within the areas targeted by this assay, and inadequate number of viral copies (<131 copies/mL). A negative result must be combined with clinical observations, patient history, and epidemiological information. The expected  result is Negative. Fact Sheet for Patients:  PinkCheek.be Fact Sheet for Healthcare Providers:  GravelBags.it This test is not yet ap proved or cleared by the [place] FDA and  has been authorized for detection and/or diagnosis of SARS-CoV-2 by FDA under an Emergency Use Authorization (EUA). This EUA will remain  in effect (meaning this test can be used) for the duration of the COVID-19 declaration under Section 564(b)(1) of the Act, 21 U.S.C. section 360bbb-3(b)(1), unless the authorization is terminated or  revoked sooner.    Influenza A by PCR NEGATIVE NEGATIVE Final   Influenza B by PCR NEGATIVE NEGATIVE Final    Comment: (NOTE) The Xpert Xpress SARS-CoV-2/FLU/RSV assay is intended as an aid in  the diagnosis of influenza from Nasopharyngeal swab specimens and  should not be used as a sole basis for treatment. Nasal washings and  aspirates are unacceptable for Xpert Xpress SARS-CoV-2/FLU/RSV  testing. Fact Sheet for Patients: PinkCheek.be Fact Sheet for Healthcare Providers: GravelBags.it This test is not yet approved or cleared by the [place] FDA and  has been authorized for detection and/or diagnosis of SARS-CoV-2 by  FDA under an Emergency Use Authorization (EUA). This EUA will remain  in effect (meaning this test can be used) for the duration of the  Covid-19 declaration under Section 564(b)(1) of the Act, 21  U.S.C. section 360bbb-3(b)(1), unless the authorization is  terminated or revoked. Performed at [facility], [place] [address], [place], [place] [address]          Radiology Studies: No results found.      Scheduled Meds: . HYDROmorphone      . amLODipine  10 mg Oral QHS  . Chlorhexidine Gluconate Cloth  6 each Topical Q0600  . Chlorhexidine Gluconate Cloth  6 each Topical Q0600  . gabapentin  300 mg Oral Q T,Th,Sat-1800    . heparin  5,000 Units Subcutaneous Q8H  . multivitamin  1 tablet Oral Daily  . sodium zirconium cyclosilicate  10 g Oral TID   Continuous Infusions:   LOS: 3 days        Hosie Poisson, MD [facility]   To contact the attending provider between 7A-7P or the covering provider during after hours 7P-7A, please log into the web site [email or web address] and access using universal [place] password for that web site. If you do not have the password, please call the hospital operator.  07/29/2019, 2:42 PM

## 2019-07-30 LAB — COMPREHENSIVE METABOLIC PANEL
ALT: 10 U/L (ref 0–44)
AST: 11 U/L — ABNORMAL LOW (ref 15–41)
Albumin: 3.9 g/dL (ref 3.5–5.0)
Alkaline Phosphatase: 62 U/L (ref 38–126)
Anion gap: 16 — ABNORMAL HIGH (ref 5–15)
BUN: 36 mg/dL — ABNORMAL HIGH (ref 8–23)
CO2: 26 mmol/L (ref 22–32)
Calcium: 8.8 mg/dL — ABNORMAL LOW (ref 8.9–10.3)
Chloride: 96 mmol/L — ABNORMAL LOW (ref 98–111)
Creatinine, Ser: 11.55 mg/dL — ABNORMAL HIGH (ref 0.61–1.24)
GFR calc Af Amer: 5 mL/min — ABNORMAL LOW (ref >60)
GFR calc non Af Amer: 4 mL/min — ABNORMAL LOW (ref >60)
Glucose, Bld: 120 mg/dL — ABNORMAL HIGH (ref 70–99)
Potassium: 3.9 mmol/L (ref 3.5–5.1)
Sodium: 138 mmol/L (ref 135–145)
Total Bilirubin: 0.8 mg/dL (ref 0.3–1.2)
Total Protein: 7.9 g/dL (ref 6.5–8.1)

## 2019-07-30 LAB — GLUCOSE, CAPILLARY
Glucose-Capillary: 114 mg/dL — ABNORMAL HIGH (ref 70–99)
Glucose-Capillary: 130 mg/dL — ABNORMAL HIGH (ref 70–99)
Glucose-Capillary: 176 mg/dL — ABNORMAL HIGH (ref 70–99)
Glucose-Capillary: 192 mg/dL — ABNORMAL HIGH (ref 70–99)

## 2019-07-30 LAB — LIPASE, BLOOD: Lipase: 115 U/L — ABNORMAL HIGH (ref 11–51)

## 2019-07-30 MED ORDER — CHLORHEXIDINE GLUCONATE CLOTH 2 % EX PADS
6.0000 | MEDICATED_PAD | Freq: Every day | CUTANEOUS | Status: DC
  Administered 2019-07-30 – 2019-07-31 (×2): 6 via TOPICAL

## 2019-07-30 NOTE — Progress Notes (Signed)
[facility] Progress Note   Subjective:   Patient seen and examined at bedside.  Feeling well today, just tired.  Denies abdominal pain, n/v/d, CP, SOB, orthopnea, weakness, dizziness and fatigue.   Objective Vitals:   07/29/19 1718 07/29/19 2102 07/30/19 0500 07/30/19 0522  BP: 130/81 122/76  118/71  Pulse: 85 93  81  Resp: 17 18  17   Temp: 97.9 F (36.6 C) 99.2 F (37.3 C)  99.3 F (37.4 C)  TempSrc: Oral Oral  Oral  SpO2: 100% 100%  99%  Weight:   100.2 kg   Height:       Physical Exam General:WDWN male in NAD Heart:RRR Lungs:CTAB, nml WOB Abdomen:soft, NTND, +BS Extremities:no LE edema Dialysis Access: LU AVF +b   Filed Weights   07/29/19 1222 07/29/19 1648 07/30/19 0500  Weight: 102.3 kg 99.1 kg 100.2 kg    Intake/Output Summary (Last 24 hours) at 07/30/2019 1124 Last data filed at 07/29/2019 2300 Gross per 24 hour  Intake 100 ml  Output 3000 ml  Net -2900 ml    Additional Objective Labs: Basic Metabolic Panel: Recent Labs  Lab 07/27/19 0532 07/27/19 0532 07/27/19 1832 07/28/19 0246 07/29/19 0200 07/29/19 1405  NA 135  --   --  135 135  --   K 6.4*   < > 3.8 4.3 3.6  --   CL 101  --   --  94* 97*  --   CO2 14*  --   --  24 24  --   GLUCOSE 94  --   --  123* 108*  --   BUN 100*  --   --  55* 65*  --   CREATININE 19.77*  --   --  13.66* 15.52*  --   CALCIUM 8.6*  --   --  8.9 8.1*  --   PHOS  --   --   --   --   --  8.7*   < > = values in this interval not displayed.   Liver Function Tests: Recent Labs  Lab 07/27/19 0532 07/28/19 0246 07/29/19 0200  AST 13* 10* 8*  ALT 13 11 10   ALKPHOS 65 70 58  BILITOT 0.9 1.0 0.8  PROT 7.7 7.8 7.7  ALBUMIN 4.0 3.7 3.7   Recent Labs  Lab 07/26/19 2150 07/28/19 0246 07/29/19 0200  LIPASE 137* 132* 128*   CBC: Recent Labs  Lab 07/26/19 2150 07/27/19 0532 07/29/19 1247  WBC 8.9 7.5 5.0  NEUTROABS  --  5.0  --   HGB 11.4* 11.2* 10.2*  HCT 34.4* 34.0* 30.6*  MCV 96.6 97.4 93.3   PLT 244 225 219   CBG: Recent Labs  Lab 07/29/19 0040 07/29/19 0550 07/29/19 1716 07/30/19 0002 07/30/19 0602  GLUCAP 106* 102* 93 176* 130*    Medications:  . amLODipine  10 mg Oral QHS  . Chlorhexidine Gluconate Cloth  6 each Topical Q0600  . Chlorhexidine Gluconate Cloth  6 each Topical Q0600  . gabapentin  300 mg Oral Q T,Th,Sat-1800  . heparin  5,000 Units Subcutaneous Q8H  . multivitamin  1 tablet Oral Daily  . sodium zirconium cyclosilicate  10 g Oral TID    Dialysis Orders: T[facility] -[facility] 4.5hrs, BFR550, [id number], [id number]  Access:LU AVF +b/t Heparin2600 Calcitriol1.50mcg PO qHD  Assessment/Plan: 1. Hyperkalemia-resolved with HD, last K 3.6 today. 2. SOB-resolved with HD. Does not appear volume overloaded.net UF 3L removed yesterday.  3. Epigastric pain/n/v-?acute pancreatitis, uremia likely contributed. Improved. Per  primary 4. Non compliance with HD- missed last 2 HD, says issue w/clotted AVF but bruit heard, noted that treatment not completed d/t clotted access but no notes about plan. No issues with cannulating AVF during admission.  Stressed importance of compliance with HD.  5. ESRD- On HD TTS.HD yesterday tolerated well.  Orders written for HD tomorrow per regular schedule.   6. Hypertension- BPwell controlled. Monitor. 7. Anemiaof CKD- Hgb 10.2. No indication for ESA at this time. Cont to follow.  8. Secondary Hyperparathyroidism -Ca close to goal. Phos elevated. Continue VDRA and binders.  9. Nutrition- Renal diet w/fluid restrictions 10. DMT2 - per primary  Jen Mow, PA-C [facility] Pager: [phone] 07/30/2019,11:24 AM  LOS: 4 days

## 2019-07-30 NOTE — Progress Notes (Signed)
PROGRESS NOTE    Jonathan Mcneil  MPN:[id number] DOB: 1955/11/10 DOA: 07/26/2019 PCP: [place], [place]    Chief Complaint  Patient presents with  . Shortness of Breath  . Abdominal Pain    Brief Narrative:   64 year old male with prior history of end-stage renal disease on dialysis, essential hypertension, type 2 diabetes mellitus, schizophrenia presents to ED with shortness of breath and epigastric pain with nausea and vomiting.  He also reports that he missed 2 sessions of dialysis.  Pt seen and examined, patient reports his abdominal pain has improved no nausea or vomiting today.   Assessment & Plan:   Principal Problem:   Acute pancreatitis Active Problems:   ESRD (end stage renal disease) (HCC)   Abdominal pain associated with nausea and vomiting probably secondary to acute pancreatitis : Differential includes gastroenteritis. Probably idiopathic, on exam today patient denies any tenderness.  No nausea or vomiting. CT of the abdomen pelvis does not show any gallstones or signs of necrosis.  Patient denies taking any alcohol. Triglyceride level at 278.  Improving lipase levels Symptomatic management with bowel rest, pain control and antiemetics. Started on clears and advance to soft diet if able to tolerate soft diet patient can be discharged in the morning.   End-stage renal disease on dialysis with hyperkalemia Hyperkalemia resolved. Neurology on board appreciate recommendations.     Type 2 diabetes mellitus with hyperglycemia CBG (last 3)  Recent Labs    07/30/19 0002 07/30/19 0602 07/30/19 1159  GLUCAP 176* 130* 114*  Continue with sliding scale insulin no changes in medications.    Essential hypertension Well-controlled, continue with amlodipine.   Anemia of chronic disease Transfuse to keep hemoglobin greater than 7. Hemoglobin is stable around 10.2.     History of schizophrenia Not on any medications at this  time.  Appears to be stable.  DVT prophylaxis Heparin Code Status: DNR Family Communication: None at bedside Disposition:   Status is: Inpatient  Remains inpatient appropriate because:Ongoing active pain requiring inpatient pain management and IV treatments appropriate due to intensity of illness or inability to take PO   Dispo: The patient is from: Home              Anticipated d/c is to: Home              Anticipated d/c date is: 1 day              Patient currently is not medically stable to d/c.        Consultants:   Nephrology.    Procedures: none.   Antimicrobials: none.   Subjective: Patient reported abdominal pain has much improved nausea vomiting, advance diet to soft today plan to discharge the patient in the morning if able to tolerate soft diet. Objective: Vitals:   07/29/19 2102 07/30/19 0500 07/30/19 0522 07/30/19 1615  BP: 122/76  118/71 123/67  Pulse: 93  81 77  Resp: 18  17 20   Temp: 99.2 F (37.3 C)  99.3 F (37.4 C) 99.1 F (37.3 C)  TempSrc: Oral  Oral Oral  SpO2: 100%  99% 100%  Weight:  100.2 kg    Height:        Intake/Output Summary (Last 24 hours) at 07/30/2019 1636 Last data filed at 07/29/2019 2300 Gross per 24 hour  Intake 100 ml  Output 3000 ml  Net -2900 ml   Filed Weights   07/29/19 1222 07/29/19 1648 07/30/19 0500  Weight: 102.3  kg 99.1 kg 100.2 kg    Examination:  General exam: Alert and comfortable, not in any kind of distress Respiratory system: Clear to auscultation bilaterally, no wheezing or rhonchi Cardiovascular system: S1-S2 heard,, no JVD, regular rate rhythm Gastrointestinal system: Abdomen is soft, nontender, nondistended, bowel sounds normal Central nervous system: Alert and nonfocal Extremities: No cyanosis or clubbing Skin: No rashes seen Psychiatry: Mood is appropriate   Data Reviewed: I have personally reviewed following labs and imaging studies  CBC: Recent Labs  Lab 07/26/19 2150  07/27/19 0532 07/29/19 1247  WBC 8.9 7.5 5.0  NEUTROABS  --  5.0  --   HGB 11.4* 11.2* 10.2*  HCT 34.4* 34.0* 30.6*  MCV 96.6 97.4 93.3  PLT 244 225 371    Basic Metabolic Panel: Recent Labs  Lab 07/26/19 2150 07/26/19 2150 07/27/19 0532 07/27/19 1832 07/28/19 0246 07/29/19 0200 07/29/19 1405 07/30/19 1241  NA 136  --  135  --  135 135  --  138  K 6.6*   < > 6.4* 3.8 4.3 3.6  --  3.9  CL 103  --  101  --  94* 97*  --  96*  CO2 15*  --  14*  --  24 24  --  26  GLUCOSE 98  --  94  --  123* 108*  --  120*  BUN 94*  --  100*  --  55* 65*  --  36*  CREATININE 19.29*  --  19.77*  --  13.66* 15.52*  --  11.55*  CALCIUM 8.8*  --  8.6*  --  8.9 8.1*  --  8.8*  PHOS  --   --   --   --   --   --  8.7*  --    < > = values in this interval not displayed.    GFR: Estimated Creatinine Clearance: 7.6 mL/min (A) (by C-G formula based on SCr of 11.55 mg/dL (H)).  Liver Function Tests: Recent Labs  Lab 07/26/19 2150 07/27/19 0532 07/28/19 0246 07/29/19 0200 07/30/19 1241  AST 8* 13* 10* 8* 11*  ALT 12 13 11 10 10   ALKPHOS 68 65 70 58 62  BILITOT 0.6 0.9 1.0 0.8 0.8  PROT 8.6* 7.7 7.8 7.7 7.9  ALBUMIN 4.4 4.0 3.7 3.7 3.9    CBG: Recent Labs  Lab 07/29/19 0550 07/29/19 1716 07/30/19 0002 07/30/19 0602 07/30/19 1159  GLUCAP 102* 93 176* 130* 114*     Recent Results (from the past 240 hour(s))  Respiratory Panel by RT PCR (Flu A&B, Covid) - Nasopharyngeal Swab     Status: None   Collection Time: 07/26/19 11:28 PM   Specimen: Nasopharyngeal Swab  Result Value Ref Range Status   SARS Coronavirus 2 by RT PCR NEGATIVE NEGATIVE Final    Comment: (NOTE) SARS-CoV-2 target nucleic acids are NOT DETECTED. The SARS-CoV-2 RNA is generally detectable in upper respiratoy specimens during the acute phase of infection. The lowest concentration of SARS-CoV-2 viral copies this assay can detect is 131 copies/mL. A negative result does not preclude SARS-Cov-2 infection and should  not be used as the sole basis for treatment or other patient management decisions. A negative result may occur with  improper specimen collection/handling, submission of specimen other than nasopharyngeal swab, presence of viral mutation(s) within the areas targeted by this assay, and inadequate number of viral copies (<131 copies/mL). A negative result must be combined with clinical observations, patient history, and epidemiological information. The expected result  is Negative. Fact Sheet for Patients:  PinkCheek.be Fact Sheet for Healthcare Providers:  GravelBags.it This test is not yet ap proved or cleared by the [place] FDA and  has been authorized for detection and/or diagnosis of SARS-CoV-2 by FDA under an Emergency Use Authorization (EUA). This EUA will remain  in effect (meaning this test can be used) for the duration of the COVID-19 declaration under Section 564(b)(1) of the Act, 21 U.S.C. section 360bbb-3(b)(1), unless the authorization is terminated or revoked sooner.    Influenza A by PCR NEGATIVE NEGATIVE Final   Influenza B by PCR NEGATIVE NEGATIVE Final    Comment: (NOTE) The Xpert Xpress SARS-CoV-2/FLU/RSV assay is intended as an aid in  the diagnosis of influenza from Nasopharyngeal swab specimens and  should not be used as a sole basis for treatment. Nasal washings and  aspirates are unacceptable for Xpert Xpress SARS-CoV-2/FLU/RSV  testing. Fact Sheet for Patients: PinkCheek.be Fact Sheet for Healthcare Providers: GravelBags.it This test is not yet approved or cleared by the [place] FDA and  has been authorized for detection and/or diagnosis of SARS-CoV-2 by  FDA under an Emergency Use Authorization (EUA). This EUA will remain  in effect (meaning this test can be used) for the duration of the  Covid-19 declaration under Section 564(b)(1)  of the Act, 21  U.S.C. section 360bbb-3(b)(1), unless the authorization is  terminated or revoked. Performed at [facility], [place] [address] [place],  [address]          Radiology Studies: No results found.      Scheduled Meds: . amLODipine  10 mg Oral QHS  . Chlorhexidine Gluconate Cloth  6 each Topical Q0600  . gabapentin  300 mg Oral Q T,Th,Sat-1800  . heparin  5,000 Units Subcutaneous Q8H  . multivitamin  1 tablet Oral Daily  . sodium zirconium cyclosilicate  10 g Oral TID   Continuous Infusions:   LOS: 4 days        Hosie Poisson, MD [facility]   To contact the attending provider between 7A-7P or the covering provider during after hours 7P-7A, please log into the web site [email or web address] and access using universal [place] password for that web site. If you do not have the password, please call the hospital operator.  07/30/2019, 4:36 PM

## 2019-07-31 LAB — CBC
HCT: 30 % — ABNORMAL LOW (ref 39.0–52.0)
Hemoglobin: 10.2 g/dL — ABNORMAL LOW (ref 13.0–17.0)
MCH: 31.8 pg (ref 26.0–34.0)
MCHC: 34 g/dL (ref 30.0–36.0)
MCV: 93.5 fL (ref 80.0–100.0)
Platelets: 263 10*3/uL (ref 150–400)
RBC: 3.21 MIL/uL — ABNORMAL LOW (ref 4.22–5.81)
RDW: 15.2 % (ref 11.5–15.5)
WBC: 6.7 10*3/uL (ref 4.0–10.5)
nRBC: 0 % (ref 0.0–0.2)

## 2019-07-31 LAB — RENAL FUNCTION PANEL
Albumin: 3.8 g/dL (ref 3.5–5.0)
Anion gap: 17 — ABNORMAL HIGH (ref 5–15)
BUN: 48 mg/dL — ABNORMAL HIGH (ref 8–23)
CO2: 24 mmol/L (ref 22–32)
Calcium: 8.6 mg/dL — ABNORMAL LOW (ref 8.9–10.3)
Chloride: 91 mmol/L — ABNORMAL LOW (ref 98–111)
Creatinine, Ser: 13.36 mg/dL — ABNORMAL HIGH (ref 0.61–1.24)
GFR calc Af Amer: 4 mL/min — ABNORMAL LOW (ref >60)
GFR calc non Af Amer: 3 mL/min — ABNORMAL LOW (ref >60)
Glucose, Bld: 117 mg/dL — ABNORMAL HIGH (ref 70–99)
Phosphorus: 7.4 mg/dL — ABNORMAL HIGH (ref 2.5–4.6)
Potassium: 3.8 mmol/L (ref 3.5–5.1)
Sodium: 132 mmol/L — ABNORMAL LOW (ref 135–145)

## 2019-07-31 LAB — GLUCOSE, CAPILLARY
Glucose-Capillary: 110 mg/dL — ABNORMAL HIGH (ref 70–99)
Glucose-Capillary: 119 mg/dL — ABNORMAL HIGH (ref 70–99)

## 2019-07-31 MED ORDER — HEPARIN SODIUM (PORCINE) 1000 UNIT/ML IJ SOLN
INTRAMUSCULAR | Status: AC
  Administered 2019-07-31: 2600 [IU]
  Filled 2019-07-31: qty 3

## 2019-07-31 NOTE — Plan of Care (Signed)
Resolved, discharged

## 2019-07-31 NOTE — Progress Notes (Addendum)
[facility] Progress Note   Subjective:   Patient seen and examined at bed side in dialysis.  Feeling much better today.  Tolerating diet.  Denies CP, SOB, abdominal pain, n/v/d, weakness, dizziness and fatigue.    Objective Vitals:   07/30/19 1615 07/30/19 2020 07/31/19 0430 07/31/19 0500  BP: 123/67 128/70 (!) 141/83   Pulse: 77 78 78   Resp: 20 18 18    Temp: 99.1 F (37.3 C) 98.7 F (37.1 C) 98.7 F (37.1 C)   TempSrc: Oral Oral Oral   SpO2: 100% 100% 100%   Weight:    101.9 kg  Height:       Physical Exam General:NAD, WDWN, well appearing male  Heart:RRR Lungs:CTAB, nml WOB Abdomen:soft, NTND Extremities:no LE edema Dialysis Access: LU AVF cannulated   Filed Weights   07/29/19 1648 07/30/19 0500 07/31/19 0500  Weight: 99.1 kg 100.2 kg 101.9 kg    Intake/Output Summary (Last 24 hours) at 07/31/2019 0813 Last data filed at 07/31/2019 0500 Gross per 24 hour  Intake 170 ml  Output -  Net 170 ml    Additional Objective Labs: Basic Metabolic Panel: Recent Labs  Lab 07/28/19 0246 07/29/19 0200 07/29/19 1405 07/30/19 1241  NA 135 135  --  138  K 4.3 3.6  --  3.9  CL 94* 97*  --  96*  CO2 24 24  --  26  GLUCOSE 123* 108*  --  120*  BUN 55* 65*  --  36*  CREATININE 13.66* 15.52*  --  11.55*  CALCIUM 8.9 8.1*  --  8.8*  PHOS  --   --  8.7*  --    Liver Function Tests: Recent Labs  Lab 07/28/19 0246 07/29/19 0200 07/30/19 1241  AST 10* 8* 11*  ALT 11 10 10   ALKPHOS 70 58 62  BILITOT 1.0 0.8 0.8  PROT 7.8 7.7 7.9  ALBUMIN 3.7 3.7 3.9   Recent Labs  Lab 07/28/19 0246 07/29/19 0200 07/30/19 1241  LIPASE 132* 128* 115*   CBC: Recent Labs  Lab 07/26/19 2150 07/27/19 0532 07/29/19 1247  WBC 8.9 7.5 5.0  NEUTROABS  --  5.0  --   HGB 11.4* 11.2* 10.2*  HCT 34.4* 34.0* 30.6*  MCV 96.6 97.4 93.3  PLT 244 225 219   CBG: Recent Labs  Lab 07/30/19 0602 07/30/19 1159 07/30/19 1647 07/31/19 0016 07/31/19 0517  GLUCAP 130* 114*  192* 119* 110*    Medications:  . amLODipine  10 mg Oral QHS  . Chlorhexidine Gluconate Cloth  6 each Topical Q0600  . gabapentin  300 mg Oral Q T,Th,Sat-1800  . heparin  5,000 Units Subcutaneous Q8H  . multivitamin  1 tablet Oral Daily  . sodium zirconium cyclosilicate  10 g Oral TID    Dialysis Orders: T[facility] -[facility] 4.5hrs, BFR550, [id number], [id number]  Access:LU AVF +b/t Heparin2600 Calcitriol1.97mcg PO qHD  Assessment/Plan: 1. Hyperkalemia-resolved with HD, last K3.9today. 2. SOB-resolved with HD. Does not appear volume overloaded.net UFG 3L today. 3. Epigastric pain/n/v-?acute pancreatitis, uremia likely contributed.Resolved.Per primary 4. Non compliance with HD- missed last 2 HD, says issue w/clotted AVF but bruit heard, noted that treatment not completed d/t clotted access but no notes about plan. No issues with cannulating AVF during admission.  Stressed importance of compliance with HD.  5. ESRD- On HD TTS.HD today per regular schedule.   6. Hypertension- BP mildly elevated this AM, expect improvement post HD. Monitor. 7. Anemiaof CKD- Hgb 10.2. No indication for ESA at  this time.Cont to follow. 8. Secondary Hyperparathyroidism -Ca close to goal.Phos elevated.Continue VDRA and binders.  9. Nutrition- Renal diet w/fluid restrictions 10. DMT2 - per primary 11. Dispo - ok to d/c from renal standpoint  Jen Mow, PA-C [facility] Pager: [phone] 07/31/2019,8:13 AM  LOS: 5 days   Nephrology attending: Patient was seen and examined in dialysis unit.  I agree with assessment and plan as outlined above. Tolerating HD well.  No new event or complaint.  Potassium level acceptable.  No issue with AV fistula.  Katheran Mathieu, MD [facility].

## 2019-07-31 NOTE — Plan of Care (Signed)
  Problem: Health Behavior/Discharge Planning: Goal: Ability to manage health-related needs will improve Outcome: Adequate for Discharge   Problem: Clinical Measurements: Goal: Respiratory complications will improve Outcome: Not Applicable   Problem: Activity: Goal: Risk for activity intolerance will decrease Outcome: Progressing   Problem: Nutrition: Goal: Adequate nutrition will be maintained Outcome: Adequate for Discharge

## 2019-08-01 ENCOUNTER — Telehealth: Payer: Self-pay | Admitting: Nephrology

## 2019-08-01 NOTE — Telephone Encounter (Signed)
Transition of Care Contact from [place]  Date of Discharge: 07/31/19 Date of Contact: 08/01/19 Method of contact: phone  Attempted to contact patient to discuss transition of care from inpatient admission. Patient did not answer the phone.  Message was left on patient's voicemail informing them we would attempt to call them again and if unable to reach will follow up at dialysis.  Jen Mow, PA-C [facility] Pager: [phone]

## 2019-08-02 ENCOUNTER — Telehealth: Payer: Self-pay | Admitting: Physician Assistant
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
## 2019-08-02 NOTE — Telephone Encounter (Signed)
Transition of care contact from inpatient facility  Date of discharge: 07/31/19 Date of contact:  08/02/19 Method: Phone  Patient contacted to discuss transition of care from recent inpatient hospitalization. atient did not answer the phone.  Message was left on patient's voicemail informing them we would plan to follow up at outpatient HD unit, call back instructions given.   Anice Paganini, PA-C 08/02/2019, 1:27 PM  [facility] Pager: [phone]

## 2019-08-03 DIAGNOSIS — R52 Pain, unspecified: Secondary | ICD-10-CM | POA: Diagnosis not present

## 2019-08-03 DIAGNOSIS — D631 Anemia in chronic kidney disease: Secondary | ICD-10-CM | POA: Diagnosis not present

## 2019-08-03 DIAGNOSIS — L299 Pruritus, unspecified: Secondary | ICD-10-CM | POA: Diagnosis not present

## 2019-08-03 DIAGNOSIS — E875 Hyperkalemia: Secondary | ICD-10-CM | POA: Diagnosis not present

## 2019-08-03 DIAGNOSIS — N186 End stage renal disease: Secondary | ICD-10-CM | POA: Diagnosis not present

## 2019-08-03 DIAGNOSIS — N2581 Secondary hyperparathyroidism of renal origin: Secondary | ICD-10-CM | POA: Diagnosis not present

## 2019-08-03 DIAGNOSIS — E1129 Type 2 diabetes mellitus with other diabetic kidney complication: Secondary | ICD-10-CM | POA: Diagnosis not present

## 2019-08-03 DIAGNOSIS — E8770 Fluid overload, unspecified: Secondary | ICD-10-CM | POA: Diagnosis not present

## 2019-08-03 DIAGNOSIS — Z992 Dependence on renal dialysis: Secondary | ICD-10-CM | POA: Diagnosis not present

## 2019-08-03 DIAGNOSIS — D689 Coagulation defect, unspecified: Secondary | ICD-10-CM | POA: Diagnosis not present

## 2019-08-05 DIAGNOSIS — N2581 Secondary hyperparathyroidism of renal origin: Secondary | ICD-10-CM | POA: Diagnosis not present

## 2019-08-05 DIAGNOSIS — D689 Coagulation defect, unspecified: Secondary | ICD-10-CM | POA: Diagnosis not present

## 2019-08-05 DIAGNOSIS — D631 Anemia in chronic kidney disease: Secondary | ICD-10-CM | POA: Diagnosis not present

## 2019-08-05 DIAGNOSIS — N186 End stage renal disease: Secondary | ICD-10-CM | POA: Diagnosis not present

## 2019-08-05 DIAGNOSIS — R52 Pain, unspecified: Secondary | ICD-10-CM | POA: Diagnosis not present

## 2019-08-05 DIAGNOSIS — Z992 Dependence on renal dialysis: Secondary | ICD-10-CM | POA: Diagnosis not present

## 2019-08-05 DIAGNOSIS — L299 Pruritus, unspecified: Secondary | ICD-10-CM | POA: Diagnosis not present

## 2019-08-05 DIAGNOSIS — E1129 Type 2 diabetes mellitus with other diabetic kidney complication: Secondary | ICD-10-CM | POA: Diagnosis not present

## 2019-08-06 DIAGNOSIS — N186 End stage renal disease: Secondary | ICD-10-CM | POA: Diagnosis not present

## 2019-08-06 DIAGNOSIS — T82858A Stenosis of vascular prosthetic devices, implants and grafts, initial encounter: Secondary | ICD-10-CM | POA: Diagnosis not present

## 2019-08-06 DIAGNOSIS — I871 Compression of vein: Secondary | ICD-10-CM | POA: Diagnosis not present

## 2019-08-06 DIAGNOSIS — Z992 Dependence on renal dialysis: Secondary | ICD-10-CM | POA: Diagnosis not present

## 2019-08-07 DIAGNOSIS — D631 Anemia in chronic kidney disease: Secondary | ICD-10-CM | POA: Diagnosis not present

## 2019-08-07 DIAGNOSIS — D689 Coagulation defect, unspecified: Secondary | ICD-10-CM | POA: Diagnosis not present

## 2019-08-07 DIAGNOSIS — R52 Pain, unspecified: Secondary | ICD-10-CM | POA: Diagnosis not present

## 2019-08-07 DIAGNOSIS — L299 Pruritus, unspecified: Secondary | ICD-10-CM | POA: Diagnosis not present

## 2019-08-07 DIAGNOSIS — N186 End stage renal disease: Secondary | ICD-10-CM | POA: Diagnosis not present

## 2019-08-07 DIAGNOSIS — Z992 Dependence on renal dialysis: Secondary | ICD-10-CM | POA: Diagnosis not present

## 2019-08-07 DIAGNOSIS — E1129 Type 2 diabetes mellitus with other diabetic kidney complication: Secondary | ICD-10-CM | POA: Diagnosis not present

## 2019-08-07 DIAGNOSIS — N2581 Secondary hyperparathyroidism of renal origin: Secondary | ICD-10-CM | POA: Diagnosis not present

## 2019-08-10 ENCOUNTER — Emergency Department (HOSPITAL_COMMUNITY): Payer: Medicare Other

## 2019-08-10 ENCOUNTER — Emergency Department (HOSPITAL_COMMUNITY)
Admission: EM | Admit: 2019-08-10 | Discharge: 2019-08-11 | Disposition: A | Discharge status: Home / Self Care | Payer: Medicare Other | Attending: Emergency Medicine | Admitting: Emergency Medicine

## 2019-08-10 ENCOUNTER — Other Ambulatory Visit: Payer: Self-pay

## 2019-08-10 ENCOUNTER — Encounter (HOSPITAL_COMMUNITY): Payer: Self-pay

## 2019-08-10 DIAGNOSIS — N186 End stage renal disease: Secondary | ICD-10-CM | POA: Diagnosis not present

## 2019-08-10 DIAGNOSIS — E1122 Type 2 diabetes mellitus with diabetic chronic kidney disease: Secondary | ICD-10-CM | POA: Diagnosis not present

## 2019-08-10 DIAGNOSIS — I120 Hypertensive chronic kidney disease with stage 5 chronic kidney disease or end stage renal disease: Secondary | ICD-10-CM | POA: Insufficient documentation

## 2019-08-10 DIAGNOSIS — R519 Headache, unspecified: Secondary | ICD-10-CM | POA: Insufficient documentation

## 2019-08-10 DIAGNOSIS — K861 Other chronic pancreatitis: Secondary | ICD-10-CM | POA: Diagnosis not present

## 2019-08-10 DIAGNOSIS — Z87891 Personal history of nicotine dependence: Secondary | ICD-10-CM | POA: Diagnosis not present

## 2019-08-10 DIAGNOSIS — D631 Anemia in chronic kidney disease: Secondary | ICD-10-CM | POA: Diagnosis not present

## 2019-08-10 DIAGNOSIS — R600 Localized edema: Secondary | ICD-10-CM | POA: Insufficient documentation

## 2019-08-10 DIAGNOSIS — Z992 Dependence on renal dialysis: Secondary | ICD-10-CM | POA: Insufficient documentation

## 2019-08-10 DIAGNOSIS — R52 Pain, unspecified: Secondary | ICD-10-CM | POA: Diagnosis not present

## 2019-08-10 DIAGNOSIS — R109 Unspecified abdominal pain: Secondary | ICD-10-CM | POA: Insufficient documentation

## 2019-08-10 DIAGNOSIS — I10 Essential (primary) hypertension: Secondary | ICD-10-CM | POA: Diagnosis not present

## 2019-08-10 DIAGNOSIS — D689 Coagulation defect, unspecified: Secondary | ICD-10-CM | POA: Diagnosis not present

## 2019-08-10 DIAGNOSIS — Z79899 Other long term (current) drug therapy: Secondary | ICD-10-CM | POA: Diagnosis not present

## 2019-08-10 DIAGNOSIS — R42 Dizziness and giddiness: Secondary | ICD-10-CM | POA: Diagnosis present

## 2019-08-10 DIAGNOSIS — R200 Anesthesia of skin: Secondary | ICD-10-CM | POA: Diagnosis not present

## 2019-08-10 DIAGNOSIS — R112 Nausea with vomiting, unspecified: Secondary | ICD-10-CM | POA: Diagnosis not present

## 2019-08-10 DIAGNOSIS — N2581 Secondary hyperparathyroidism of renal origin: Secondary | ICD-10-CM | POA: Diagnosis not present

## 2019-08-10 DIAGNOSIS — E1129 Type 2 diabetes mellitus with other diabetic kidney complication: Secondary | ICD-10-CM | POA: Diagnosis not present

## 2019-08-10 DIAGNOSIS — L299 Pruritus, unspecified: Secondary | ICD-10-CM | POA: Diagnosis not present

## 2019-08-10 LAB — I-STAT CHEM 8, ED
BUN: 28 mg/dL — ABNORMAL HIGH (ref 8–23)
Calcium, Ion: 1.05 mmol/L — ABNORMAL LOW (ref 1.15–1.40)
Chloride: 94 mmol/L — ABNORMAL LOW (ref 98–111)
Creatinine, Ser: 8.1 mg/dL — ABNORMAL HIGH (ref 0.61–1.24)
Glucose, Bld: 131 mg/dL — ABNORMAL HIGH (ref 70–99)
HCT: 32 % — ABNORMAL LOW (ref 39.0–52.0)
Hemoglobin: 10.9 g/dL — ABNORMAL LOW (ref 13.0–17.0)
Potassium: 4.1 mmol/L (ref 3.5–5.1)
Sodium: 136 mmol/L (ref 135–145)
TCO2: 33 mmol/L — ABNORMAL HIGH (ref 22–32)

## 2019-08-10 LAB — COMPREHENSIVE METABOLIC PANEL
ALT: 19 U/L (ref 0–44)
AST: 20 U/L (ref 15–41)
Albumin: 3.9 g/dL (ref 3.5–5.0)
Alkaline Phosphatase: 61 U/L (ref 38–126)
Anion gap: 14 (ref 5–15)
BUN: 25 mg/dL — ABNORMAL HIGH (ref 8–23)
CO2: 31 mmol/L (ref 22–32)
Calcium: 9.2 mg/dL (ref 8.9–10.3)
Chloride: 92 mmol/L — ABNORMAL LOW (ref 98–111)
Creatinine, Ser: 7.55 mg/dL — ABNORMAL HIGH (ref 0.61–1.24)
GFR calc Af Amer: 8 mL/min — ABNORMAL LOW (ref >60)
GFR calc non Af Amer: 7 mL/min — ABNORMAL LOW (ref >60)
Glucose, Bld: 134 mg/dL — ABNORMAL HIGH (ref 70–99)
Potassium: 4.7 mmol/L (ref 3.5–5.1)
Sodium: 137 mmol/L (ref 135–145)
Total Bilirubin: 0.6 mg/dL (ref 0.3–1.2)
Total Protein: 7.9 g/dL (ref 6.5–8.1)

## 2019-08-10 LAB — DIFFERENTIAL
Abs Immature Granulocytes: 0.03 10*3/uL (ref 0.00–0.07)
Basophils Absolute: 0.1 10*3/uL (ref 0.0–0.1)
Basophils Relative: 1 %
Eosinophils Absolute: 0.6 10*3/uL — ABNORMAL HIGH (ref 0.0–0.5)
Eosinophils Relative: 12 %
Immature Granulocytes: 1 %
Lymphocytes Relative: 30 %
Lymphs Abs: 1.6 10*3/uL (ref 0.7–4.0)
Monocytes Absolute: 0.5 10*3/uL (ref 0.1–1.0)
Monocytes Relative: 9 %
Neutro Abs: 2.5 10*3/uL (ref 1.7–7.7)
Neutrophils Relative %: 47 %

## 2019-08-10 LAB — CBC
HCT: 32.7 % — ABNORMAL LOW (ref 39.0–52.0)
Hemoglobin: 10.7 g/dL — ABNORMAL LOW (ref 13.0–17.0)
MCH: 32 pg (ref 26.0–34.0)
MCHC: 32.7 g/dL (ref 30.0–36.0)
MCV: 97.9 fL (ref 80.0–100.0)
Platelets: 270 10*3/uL (ref 150–400)
RBC: 3.34 MIL/uL — ABNORMAL LOW (ref 4.22–5.81)
RDW: 16.5 % — ABNORMAL HIGH (ref 11.5–15.5)
WBC: 5.3 10*3/uL (ref 4.0–10.5)
nRBC: 0 % (ref 0.0–0.2)

## 2019-08-10 LAB — APTT: aPTT: 32 seconds (ref 24–36)

## 2019-08-10 LAB — PROTIME-INR
INR: 1 (ref 0.8–1.2)
Prothrombin Time: 12.5 seconds (ref 11.4–15.2)

## 2019-08-10 MED ORDER — SODIUM CHLORIDE 0.9% FLUSH: 3.0000 mL | Freq: Once | INTRAVENOUS | Status: DC

## 2019-08-10 NOTE — ED Triage Notes (Addendum)
Pt arrives to ED w/ c/o feeling dizzy and lightheaded since after dialysis. Pt tues, thurs, sat dialysis pt, pt did receive complete treatment today. Pt also endorses headache, has taken tylenol w/ no relief. Neuro intact.

## 2019-08-11 ENCOUNTER — Other Ambulatory Visit: Payer: Self-pay

## 2019-08-11 LAB — URINALYSIS, ROUTINE W REFLEX MICROSCOPIC
Bacteria, UA: NONE SEEN
Bilirubin Urine: NEGATIVE
Glucose, UA: 150 mg/dL — AB
Hgb urine dipstick: NEGATIVE
Ketones, ur: NEGATIVE mg/dL
Leukocytes,Ua: NEGATIVE
Nitrite: NEGATIVE
Protein, ur: >=300 mg/dL — AB
Specific Gravity, Urine: 1.01 (ref 1.005–1.030)
pH: 9 — ABNORMAL HIGH (ref 5.0–8.0)

## 2019-08-11 LAB — CBG MONITORING, ED: Glucose-Capillary: 106 mg/dL — ABNORMAL HIGH (ref 70–99)

## 2019-08-11 LAB — LIPASE, BLOOD: Lipase: 178 U/L — ABNORMAL HIGH (ref 11–51)

## 2019-08-11 MED ORDER — LIDOCAINE VISCOUS HCL 2 % MT SOLN
15.0000 mL | Freq: Once | OROMUCOSAL | Status: AC
  Administered 2019-08-11: 15 mL via ORAL
  Filled 2019-08-11: qty 15

## 2019-08-11 MED ORDER — ACETAMINOPHEN 500 MG PO TABS
500.0000 mg | ORAL_TABLET | Freq: Once | ORAL | Status: AC
  Administered 2019-08-11: 500 mg via ORAL
  Filled 2019-08-11: qty 1

## 2019-08-11 MED ORDER — ALUM & MAG HYDROXIDE-SIMETH 200-200-20 MG/5ML PO SUSP
30.0000 mL | Freq: Once | ORAL | Status: AC
  Administered 2019-08-11: 30 mL via ORAL
  Filled 2019-08-11: qty 30

## 2019-08-11 MED ORDER — SODIUM CHLORIDE 0.9 % IV BOLUS
500.0000 mL | Freq: Once | INTRAVENOUS | Status: AC
  Administered 2019-08-11: 500 mL via INTRAVENOUS

## 2019-08-11 MED ORDER — ONDANSETRON HCL 4 MG/2ML IJ SOLN
4.0000 mg | Freq: Once | INTRAMUSCULAR | Status: AC
  Administered 2019-08-11: 4 mg via INTRAVENOUS
  Filled 2019-08-11: qty 2

## 2019-08-11 NOTE — Discharge Instructions (Addendum)
Your work-up today was overall reassuring.  We did see evidence of your continued chronic pancreatitis causing abdominal discomfort and likely contribute to the nausea and vomiting.  We suspect you are feeling better overall due to some of the fluid shifts and changes as the symptoms have all been persistent since your dialysis regimen changes.  We touch base with our nephrology team who will try to contact your nephrology team to discuss going from 4 hours of treatment to 3 hours a treatment.  Please call your nephrologist to discuss this as well.  Please go to your dialysis treatment tomorrow and rest.  Please follow-up with both your PCP and your nephrologist.  If any symptoms change or worsen, please return to the nearest emergency department immediately.

## 2019-08-11 NOTE — ED Notes (Signed)
Patient given discharge instructions patient verbalizes understanding. 

## 2019-08-11 NOTE — ED Provider Notes (Signed)
[facility] Provider Note   CSN: [id number] Arrival date & time: 08/10/19  2119     History Chief Complaint  Patient presents with  . Dizziness    Kiel Cockerell Eisenhuth is a 64 y.o. male.  The history is provided by the patient and medical records. No language interpreter was used.  Emesis Severity:  Moderate Duration:  2 days Timing:  Intermittent Quality:  Stomach contents Progression:  Unchanged Chronicity:  Recurrent Recent urination:  Normal Relieved by:  Nothing Worsened by:  Nothing Ineffective treatments:  None tried Associated symptoms: abdominal pain and headaches   Associated symptoms: no arthralgias, no chills, no cough, no diarrhea and no fever        Past Medical History:  Diagnosis Date  . Diabetes mellitus without complication ([place])   . ESRD (end stage renal disease) ([place])   . GERD (gastroesophageal reflux disease)    PMH  . Hypertension   . Low back pain   . Metabolic acidosis   . Neuromuscular disorder ([place])    peripheral neuropathy  . Pancreatitis   . Schizophrenia ([place])    does not take medications    Patient Active Problem List   Diagnosis Date Noted  . Hyperkalemia   . Acute pancreatitis 07/26/2019  . Uremia 02/24/2018  . History of anemia due to CKD 02/24/2018  . Marijuana abuse 02/24/2018  . ESRD (end stage renal disease) ([place])   . Hypertension   . Schizophrenia ([place])   . Metabolic acidosis 56/43/3295  . Hypertensive urgency 01/15/2018  . Type 2 diabetes mellitus with stage 4 chronic kidney disease ([place]) 01/15/2018  . Acute encephalopathy 02/15/2015  . CKD (chronic kidney disease) 02/14/2015  . Acute kidney injury superimposed on chronic kidney disease ([place]) 02/14/2015  . Hyponatremia 02/14/2015  . Back pain 02/14/2015  . Neuropathy 02/14/2015  . Obesity 02/14/2015  . Abdominal pain 02/14/2015  . Pancreatitis   . Diabetes mellitus without complication ([place])   . Pancreatitis,  acute   . Schizo-affective psychosis ([place]) 04/28/2013    Past Surgical History:  Procedure Laterality Date  . A/V FISTULAGRAM Left 06/03/2018   Procedure: A/V FISTULAGRAM;  Surgeon: Marty Heck, MD;  Location: [facility];  Service: Cardiovascular;  Laterality: Left;  . AV FISTULA PLACEMENT Left 02/11/2018   Procedure: INSERTION OF ARTERIOVENOUS (AV) FISTULA LEFT  ARM;  Surgeon: [name], MD;  Location: [facility];  Service: Vascular;  Laterality: Left;  . BASCILIC VEIN TRANSPOSITION Left 04/17/2018   Procedure: BASILIC VEIN TRANSPOSITION SECOND STAGE;  Surgeon: [name], MD;  Location: [facility];  Service: Vascular;  Laterality: Left;  . COLONOSCOPY    . DENTAL SURGERY    . INSERTION OF DIALYSIS CATHETER Right 02/25/2018   Procedure: INSERTION OF DIALYSIS CATHETER;  Surgeon: [name], MD;  Location: [facility];  Service: Vascular;  Laterality: Right;  . PERIPHERAL VASCULAR BALLOON ANGIOPLASTY  06/03/2018   Procedure: PERIPHERAL VASCULAR BALLOON ANGIOPLASTY;  Surgeon: Marty Heck, MD;  Location: [facility];  Service: Cardiovascular;;  left a/v fistula       Family History  Problem Relation Age of Onset  . Kidney failure Mother     Social History   Tobacco Use  . Smoking status: Former Research scientist (life sciences)  . Smokeless tobacco: Never Used  Substance Use Topics  . Alcohol use: No  . Drug use: Not Currently    Types: Cocaine, Marijuana    Comment: smoked  marijuana a few days ago; he denies using cocaine    Home Medications Prior to Admission medications   Medication Sig Start Date End Date Taking? Authorizing Provider  acetaminophen (TYLENOL) 650 MG CR tablet Take 1,300 mg by mouth 2 (two) times daily as needed for pain.    [provider]  amLODipine (NORVASC) 10 MG tablet Take 10 mg by mouth at bedtime.  09/24/17   [provider]  calcitRIOL (ROCALTROL) 0.25 MCG capsule Take 1 capsule (0.25 mcg total) by  mouth every Monday, Wednesday, and Friday with hemodialysis. Patient not taking: Reported on 07/26/2019 03/02/18   Lavina Hamman, MD  ferric citrate (AURYXIA) 1 GM 210 MG(Fe) tablet Take 210-420 mg by mouth See admin instructions. Take two tablets (420 mg) by mouth three times daily with meals and one tablet (210 mg) with snacks.    [provider]  gabapentin (NEURONTIN) 300 MG capsule Take 300 mg by mouth See admin instructions. Take one capsule (300 mg) by mouth on Tuesday, Thursday, Saturday after dialysis 05/25/18   [provider]  multivitamin (RENA-VIT) TABS tablet Take 1 tablet by mouth daily. 03/13/18   [provider]  Psyllium (METAMUCIL PO) Take 1 Scoop by mouth daily. Mix in 8 oz liquid and drink    [provider]    Allergies    Ibuprofen and Penicillins  Review of Systems   Review of Systems  Constitutional: Positive for fatigue. Negative for chills, diaphoresis and fever.  HENT: Negative for congestion.   Eyes: Negative for visual disturbance.  Respiratory: Negative for cough, chest tightness, shortness of breath, wheezing and stridor.   Cardiovascular: Negative for chest pain, palpitations and leg swelling.  Gastrointestinal: Positive for abdominal pain, nausea and vomiting. Negative for constipation and diarrhea.  Genitourinary: Negative for dysuria, flank pain and frequency.  Musculoskeletal: Negative for arthralgias, back pain, joint swelling and neck pain.  Neurological: Positive for light-headedness and headaches. Negative for facial asymmetry and numbness.  Psychiatric/Behavioral: Negative for agitation and confusion.  All other systems reviewed and are negative.   Physical Exam Updated Vital Signs BP (!) 146/80 (BP Location: Right Arm)   Pulse 72   Resp 18   SpO2 100%   Physical Exam Vitals and nursing note reviewed.  Constitutional:      General: He is not in acute distress.    Appearance: He is well-developed. He is not  ill-appearing, toxic-appearing or diaphoretic.  HENT:     Head: Normocephalic and atraumatic.     Nose: Nose normal. No congestion or rhinorrhea.     Mouth/Throat:     Mouth: Mucous membranes are moist.     Pharynx: No oropharyngeal exudate or posterior oropharyngeal erythema.  Eyes:     Extraocular Movements: Extraocular movements intact.     Conjunctiva/sclera: Conjunctivae normal.     Pupils: Pupils are equal, round, and reactive to light.  Cardiovascular:     Rate and Rhythm: Normal rate and regular rhythm.     Pulses: Normal pulses.     Heart sounds: Murmur present.  Pulmonary:     Effort: Pulmonary effort is normal. No respiratory distress.     Breath sounds: Normal breath sounds. No wheezing, rhonchi or rales.  Chest:     Chest wall: No tenderness.  Abdominal:     General: Abdomen is flat. There is no distension.     Palpations: Abdomen is soft.     Tenderness: There is no abdominal tenderness. There is no right CVA  tenderness, left CVA tenderness, guarding or rebound.  Musculoskeletal:        General: No tenderness.     Cervical back: Neck supple. No tenderness.     Right lower leg: Edema present.     Left lower leg: Edema present.  Skin:    General: Skin is warm and dry.     Capillary Refill: Capillary refill takes less than 2 seconds.     Findings: No erythema.  Neurological:     General: No focal deficit present.     Mental Status: He is alert.     Sensory: No sensory deficit.     Motor: No weakness.  Psychiatric:        Mood and Affect: Mood normal.     ED Results / Procedures / Treatments   Labs (all labs ordered are listed, but only abnormal results are displayed) Labs Reviewed  CBC - Abnormal; Notable for the following components:      Result Value   RBC 3.34 (*)    Hemoglobin 10.7 (*)    HCT 32.7 (*)    RDW 16.5 (*)    All other components within normal limits  DIFFERENTIAL - Abnormal; Notable for the following components:   Eosinophils Absolute  0.6 (*)    All other components within normal limits  COMPREHENSIVE METABOLIC PANEL - Abnormal; Notable for the following components:   Chloride 92 (*)    Glucose, Bld 134 (*)    BUN 25 (*)    Creatinine, Ser 7.55 (*)    GFR calc non Af Amer 7 (*)    GFR calc Af Amer 8 (*)    All other components within normal limits  URINALYSIS, ROUTINE W REFLEX MICROSCOPIC - Abnormal; Notable for the following components:   pH 9.0 (*)    Glucose, UA 150 (*)    Protein, ur >=300 (*)    All other components within normal limits  LIPASE, BLOOD - Abnormal; Notable for the following components:   Lipase 178 (*)    All other components within normal limits  I-STAT CHEM 8, ED - Abnormal; Notable for the following components:   Chloride 94 (*)    BUN 28 (*)    Creatinine, Ser 8.10 (*)    Glucose, Bld 131 (*)    Calcium, Ion 1.05 (*)    TCO2 33 (*)    Hemoglobin 10.9 (*)    HCT 32.0 (*)    All other components within normal limits  CBG MONITORING, ED - Abnormal; Notable for the following components:   Glucose-Capillary 106 (*)    All other components within normal limits  PROTIME-INR  APTT    EKG EKG Interpretation  Date/Time:  Tuesday Aug 10 2019 21:32:05 EDT Ventricular Rate:  77 PR Interval:  146 QRS Duration: 92 QT Interval:  420 QTC Calculation: 475 R Axis:   15 Text Interpretation: Normal sinus rhythm Normal ECG When compared to prior, no signcicant cahnges seen. No STEMI Confirmed by Antony Blackbird [phone]) on 08/11/2019 8:25:26 AM   Radiology CT HEAD WO CONTRAST  Result Date: 08/10/2019 CLINICAL DATA:  Right hand and left sided neck numbness and tingling. EXAM: CT HEAD WITHOUT CONTRAST TECHNIQUE: Contiguous axial images were obtained from the base of the skull through the vertex without intravenous contrast. COMPARISON:  None. FINDINGS: Brain: There is mild cerebral atrophy with widening of the extra-axial spaces and ventricular dilatation. There are areas of decreased attenuation  within the white matter tracts of the supratentorial brain,  consistent with microvascular disease changes. Vascular: No hyperdense vessel or unexpected calcification. Skull: Normal. Negative for fracture or focal lesion. Sinuses/Orbits: No acute finding. Other: None. IMPRESSION: 1. No acute intracranial abnormality. 2. Mild cerebral atrophy and microvascular disease changes of the supratentorial brain. Electronically Signed   By: Virgina Norfolk M.D.   On: 08/10/2019 22:11    Procedures Procedures (including critical care time)  Medications Ordered in ED Medications  sodium chloride flush (NS) 0.9 % injection 3 mL (3 mLs Intravenous Not Given 08/11/19 0857)  ondansetron (ZOFRAN) injection 4 mg (4 mg Intravenous Given 08/11/19 0855)  sodium chloride 0.9 % bolus 500 mL (0 mLs Intravenous Stopped 08/11/19 1124)  acetaminophen (TYLENOL) tablet 500 mg (500 mg Oral Given 08/11/19 1305)  alum & mag hydroxide-simeth (MAALOX/MYLANTA) 200-200-20 MG/5ML suspension 30 mL (30 mLs Oral Given 08/11/19 1304)    And  lidocaine (XYLOCAINE) 2 % viscous mouth solution 15 mL (15 mLs Oral Given 08/11/19 1305)    ED Course  I have reviewed the triage vital signs and the nursing notes.  Pertinent labs & imaging results that were available during my care of the patient were reviewed by me and considered in my medical decision making (see chart for details).    MDM Rules/Calculators/A&P                      Benjamim Harnish Magloire is a 64 y.o. male with a past medical history significant for ESRD on dialysis TTS, diabetes, peripheral neuropathies, hypertension, prior pancreatitis, and schizoaffective disorder who presents with lightheadedness, headaches, nausea, vomiting, and abdominal aching.  Patient reports that his symptoms have been ongoing for the last 2 weeks ever since they made a change to his dialysis regimen from 3 hours a session to 4 hours a session.  He is concerned that they are taking too much fluid off.  He  says that after each treatment, he gets very lightheaded, feels like he might pass out, has abdominal discomfort with some nausea and vomiting, his peripheral neuropathies worsen.  He is presenting because last night after his Tuesday session, his symptoms would worsen.  He denies any fevers, chills, chest pain, or shortness breath at this time.  Denies any new cough or congestion.  Denies any constipation, diarrhea, or urinary changes.  He still makes some urine.  He reports he is having some abdominal aching with the nausea and vomiting that he feels may be his pancreatitis coming back.  He is very concerned about the fluid shifts his body is going through as every dialysis treatment seems to make the symptoms take place.  He reports his headache is improved since has been in the emergency department proximal 11 hours after he took some Tylenol.  Of note, I saw the patient approximately 11 hours into his ED evaluation.  On exam, lungs are clear and chest is nontender.  Abdomen was not specifically tender on my exam.  Bowel sounds were appreciated.  He does have peripheral edema but he reports that his improved from prior.  He reports his hands are tingling but otherwise no focal neurologic deficits on my initial exam.  Patient resting comfortably.  Clinically I do suspect that based on his story and the recent change in his dialysis regimen, patient may be having some exacerbation of symptoms due to the fluid shifts.  We will get a repeat lipase given his recent admission for pancreatitis.  We will give the patient some nausea medicine and a  small amount of fluids to help.  His other work-up in the emergency department was reassuring with a CT head showed no acute abnormalities and his other labs began to return reassuring.  Anticipate reassessment after work-up anticipate touching base with his nephrology team to discuss how to make possible changes to his dialysis regimen.  Work-up showed slight  elevation in his lipase compared to prior but otherwise had reassuring labs creatinine is elevated as expected given his dialysis.   Spoke with nephrology who will try to contact the patient's primary nephrology team and discuss going from 4 hours to 3 hours of treatment.  Patient was also encouraged to call them to discuss this possible change.  The fluid changes are likely what is causing the patient's symptoms with worsening neuropathies and feeling ill.  Patient is feeling better and was given a GI cocktail for the abdominal burning which she reports is improving.  He will follow up and have dialysis tomorrow and will discuss with his nephrology team the regimen changes we discussed.  Patient had other questions or concerns and was discharged in good condition after overall reassuring work-up.   Final Clinical Impression(s) / ED Diagnoses Final diagnoses:  Chronic pancreatitis, unspecified pancreatitis type ([place])  Non-intractable vomiting with nausea, unspecified vomiting type    Rx / DC Orders ED Discharge Orders    None      Clinical Impression: 1. Chronic pancreatitis, unspecified pancreatitis type ([place])   2. Non-intractable vomiting with nausea, unspecified vomiting type     Disposition: Discharge  Condition: Good  I have discussed the results, Dx and Tx plan with the pt(& family if present). He/she/they expressed understanding and agree(s) with the plan. Discharge instructions discussed at great length. Strict return precautions discussed and pt &/or family have verbalized understanding of the instructions. No further questions at time of discharge.    New Prescriptions   No medications on file    Follow Up: Merrilee Seashore, [employer] [place] [address] [phone]     your nephrologist     [facility] [address] [id number] mc [place]         Tegeler, Gwenyth Allegra, MD 08/11/19 1319

## 2019-08-12 DIAGNOSIS — R52 Pain, unspecified: Secondary | ICD-10-CM | POA: Diagnosis not present

## 2019-08-12 DIAGNOSIS — N2581 Secondary hyperparathyroidism of renal origin: Secondary | ICD-10-CM | POA: Diagnosis not present

## 2019-08-12 DIAGNOSIS — N186 End stage renal disease: Secondary | ICD-10-CM | POA: Diagnosis not present

## 2019-08-12 DIAGNOSIS — D689 Coagulation defect, unspecified: Secondary | ICD-10-CM | POA: Diagnosis not present

## 2019-08-12 DIAGNOSIS — Z992 Dependence on renal dialysis: Secondary | ICD-10-CM | POA: Diagnosis not present

## 2019-08-12 DIAGNOSIS — L299 Pruritus, unspecified: Secondary | ICD-10-CM | POA: Diagnosis not present

## 2019-08-12 DIAGNOSIS — D631 Anemia in chronic kidney disease: Secondary | ICD-10-CM | POA: Diagnosis not present

## 2019-08-12 DIAGNOSIS — E1129 Type 2 diabetes mellitus with other diabetic kidney complication: Secondary | ICD-10-CM | POA: Diagnosis not present

## 2019-08-14 DIAGNOSIS — D631 Anemia in chronic kidney disease: Secondary | ICD-10-CM | POA: Diagnosis not present

## 2019-08-14 DIAGNOSIS — D689 Coagulation defect, unspecified: Secondary | ICD-10-CM | POA: Diagnosis not present

## 2019-08-14 DIAGNOSIS — L299 Pruritus, unspecified: Secondary | ICD-10-CM | POA: Diagnosis not present

## 2019-08-14 DIAGNOSIS — N2581 Secondary hyperparathyroidism of renal origin: Secondary | ICD-10-CM | POA: Diagnosis not present

## 2019-08-14 DIAGNOSIS — N186 End stage renal disease: Secondary | ICD-10-CM | POA: Diagnosis not present

## 2019-08-14 DIAGNOSIS — R52 Pain, unspecified: Secondary | ICD-10-CM | POA: Diagnosis not present

## 2019-08-14 DIAGNOSIS — Z992 Dependence on renal dialysis: Secondary | ICD-10-CM | POA: Diagnosis not present

## 2019-08-14 DIAGNOSIS — E1129 Type 2 diabetes mellitus with other diabetic kidney complication: Secondary | ICD-10-CM | POA: Diagnosis not present

## 2019-08-17 DIAGNOSIS — D689 Coagulation defect, unspecified: Secondary | ICD-10-CM | POA: Diagnosis not present

## 2019-08-17 DIAGNOSIS — R52 Pain, unspecified: Secondary | ICD-10-CM | POA: Diagnosis not present

## 2019-08-17 DIAGNOSIS — D631 Anemia in chronic kidney disease: Secondary | ICD-10-CM | POA: Diagnosis not present

## 2019-08-17 DIAGNOSIS — Z992 Dependence on renal dialysis: Secondary | ICD-10-CM | POA: Diagnosis not present

## 2019-08-17 DIAGNOSIS — L299 Pruritus, unspecified: Secondary | ICD-10-CM | POA: Diagnosis not present

## 2019-08-17 DIAGNOSIS — E1129 Type 2 diabetes mellitus with other diabetic kidney complication: Secondary | ICD-10-CM | POA: Diagnosis not present

## 2019-08-17 DIAGNOSIS — N186 End stage renal disease: Secondary | ICD-10-CM | POA: Diagnosis not present

## 2019-08-17 DIAGNOSIS — N2581 Secondary hyperparathyroidism of renal origin: Secondary | ICD-10-CM | POA: Diagnosis not present

## 2019-08-19 DIAGNOSIS — L299 Pruritus, unspecified: Secondary | ICD-10-CM | POA: Diagnosis not present

## 2019-08-19 DIAGNOSIS — N186 End stage renal disease: Secondary | ICD-10-CM | POA: Diagnosis not present

## 2019-08-19 DIAGNOSIS — Z992 Dependence on renal dialysis: Secondary | ICD-10-CM | POA: Diagnosis not present

## 2019-08-19 DIAGNOSIS — R52 Pain, unspecified: Secondary | ICD-10-CM | POA: Diagnosis not present

## 2019-08-19 DIAGNOSIS — H40033 Anatomical narrow angle, bilateral: Secondary | ICD-10-CM | POA: Diagnosis not present

## 2019-08-19 DIAGNOSIS — H2513 Age-related nuclear cataract, bilateral: Secondary | ICD-10-CM | POA: Diagnosis not present

## 2019-08-19 DIAGNOSIS — N2581 Secondary hyperparathyroidism of renal origin: Secondary | ICD-10-CM | POA: Diagnosis not present

## 2019-08-19 DIAGNOSIS — D689 Coagulation defect, unspecified: Secondary | ICD-10-CM | POA: Diagnosis not present

## 2019-08-19 DIAGNOSIS — E1129 Type 2 diabetes mellitus with other diabetic kidney complication: Secondary | ICD-10-CM | POA: Diagnosis not present

## 2019-08-19 DIAGNOSIS — D631 Anemia in chronic kidney disease: Secondary | ICD-10-CM | POA: Diagnosis not present

## 2019-08-20 DIAGNOSIS — Z992 Dependence on renal dialysis: Secondary | ICD-10-CM | POA: Diagnosis not present

## 2019-08-20 DIAGNOSIS — I871 Compression of vein: Secondary | ICD-10-CM | POA: Diagnosis not present

## 2019-08-20 DIAGNOSIS — T82858A Stenosis of vascular prosthetic devices, implants and grafts, initial encounter: Secondary | ICD-10-CM | POA: Diagnosis not present

## 2019-08-20 DIAGNOSIS — N186 End stage renal disease: Secondary | ICD-10-CM | POA: Diagnosis not present

## 2019-08-21 DIAGNOSIS — L299 Pruritus, unspecified: Secondary | ICD-10-CM | POA: Diagnosis not present

## 2019-08-21 DIAGNOSIS — E1129 Type 2 diabetes mellitus with other diabetic kidney complication: Secondary | ICD-10-CM | POA: Diagnosis not present

## 2019-08-21 DIAGNOSIS — R52 Pain, unspecified: Secondary | ICD-10-CM | POA: Diagnosis not present

## 2019-08-21 DIAGNOSIS — N186 End stage renal disease: Secondary | ICD-10-CM | POA: Diagnosis not present

## 2019-08-21 DIAGNOSIS — Z992 Dependence on renal dialysis: Secondary | ICD-10-CM | POA: Diagnosis not present

## 2019-08-21 DIAGNOSIS — D631 Anemia in chronic kidney disease: Secondary | ICD-10-CM | POA: Diagnosis not present

## 2019-08-21 DIAGNOSIS — D689 Coagulation defect, unspecified: Secondary | ICD-10-CM | POA: Diagnosis not present

## 2019-08-21 DIAGNOSIS — N2581 Secondary hyperparathyroidism of renal origin: Secondary | ICD-10-CM | POA: Diagnosis not present

## 2019-08-23 DIAGNOSIS — E1122 Type 2 diabetes mellitus with diabetic chronic kidney disease: Secondary | ICD-10-CM | POA: Diagnosis not present

## 2019-08-23 DIAGNOSIS — N186 End stage renal disease: Secondary | ICD-10-CM | POA: Diagnosis not present

## 2019-08-23 DIAGNOSIS — Z992 Dependence on renal dialysis: Secondary | ICD-10-CM | POA: Diagnosis not present

## 2019-08-24 DIAGNOSIS — N2581 Secondary hyperparathyroidism of renal origin: Secondary | ICD-10-CM | POA: Diagnosis not present

## 2019-08-24 DIAGNOSIS — D631 Anemia in chronic kidney disease: Secondary | ICD-10-CM | POA: Diagnosis not present

## 2019-08-24 DIAGNOSIS — E1129 Type 2 diabetes mellitus with other diabetic kidney complication: Secondary | ICD-10-CM | POA: Diagnosis not present

## 2019-08-24 DIAGNOSIS — N186 End stage renal disease: Secondary | ICD-10-CM | POA: Diagnosis not present

## 2019-08-24 DIAGNOSIS — D689 Coagulation defect, unspecified: Secondary | ICD-10-CM | POA: Diagnosis not present

## 2019-08-24 DIAGNOSIS — Z992 Dependence on renal dialysis: Secondary | ICD-10-CM | POA: Diagnosis not present

## 2019-08-24 DIAGNOSIS — D509 Iron deficiency anemia, unspecified: Secondary | ICD-10-CM | POA: Diagnosis not present

## 2019-08-24 DIAGNOSIS — R52 Pain, unspecified: Secondary | ICD-10-CM | POA: Diagnosis not present

## 2019-08-26 DIAGNOSIS — D509 Iron deficiency anemia, unspecified: Secondary | ICD-10-CM | POA: Diagnosis not present

## 2019-08-26 DIAGNOSIS — D689 Coagulation defect, unspecified: Secondary | ICD-10-CM | POA: Diagnosis not present

## 2019-08-26 DIAGNOSIS — N2581 Secondary hyperparathyroidism of renal origin: Secondary | ICD-10-CM | POA: Diagnosis not present

## 2019-08-26 DIAGNOSIS — R52 Pain, unspecified: Secondary | ICD-10-CM | POA: Diagnosis not present

## 2019-08-26 DIAGNOSIS — E1129 Type 2 diabetes mellitus with other diabetic kidney complication: Secondary | ICD-10-CM | POA: Diagnosis not present

## 2019-08-26 DIAGNOSIS — Z992 Dependence on renal dialysis: Secondary | ICD-10-CM | POA: Diagnosis not present

## 2019-08-26 DIAGNOSIS — N186 End stage renal disease: Secondary | ICD-10-CM | POA: Diagnosis not present

## 2019-08-26 DIAGNOSIS — D631 Anemia in chronic kidney disease: Secondary | ICD-10-CM | POA: Diagnosis not present

## 2019-08-28 DIAGNOSIS — E1129 Type 2 diabetes mellitus with other diabetic kidney complication: Secondary | ICD-10-CM | POA: Diagnosis not present

## 2019-08-28 DIAGNOSIS — D689 Coagulation defect, unspecified: Secondary | ICD-10-CM | POA: Diagnosis not present

## 2019-08-28 DIAGNOSIS — N186 End stage renal disease: Secondary | ICD-10-CM | POA: Diagnosis not present

## 2019-08-28 DIAGNOSIS — R52 Pain, unspecified: Secondary | ICD-10-CM | POA: Diagnosis not present

## 2019-08-28 DIAGNOSIS — Z992 Dependence on renal dialysis: Secondary | ICD-10-CM | POA: Diagnosis not present

## 2019-08-28 DIAGNOSIS — D631 Anemia in chronic kidney disease: Secondary | ICD-10-CM | POA: Diagnosis not present

## 2019-08-28 DIAGNOSIS — N2581 Secondary hyperparathyroidism of renal origin: Secondary | ICD-10-CM | POA: Diagnosis not present

## 2019-08-28 DIAGNOSIS — D509 Iron deficiency anemia, unspecified: Secondary | ICD-10-CM | POA: Diagnosis not present

## 2019-08-31 DIAGNOSIS — E1129 Type 2 diabetes mellitus with other diabetic kidney complication: Secondary | ICD-10-CM | POA: Diagnosis not present

## 2019-08-31 DIAGNOSIS — D631 Anemia in chronic kidney disease: Secondary | ICD-10-CM | POA: Diagnosis not present

## 2019-08-31 DIAGNOSIS — R52 Pain, unspecified: Secondary | ICD-10-CM | POA: Diagnosis not present

## 2019-08-31 DIAGNOSIS — D689 Coagulation defect, unspecified: Secondary | ICD-10-CM | POA: Diagnosis not present

## 2019-08-31 DIAGNOSIS — Z992 Dependence on renal dialysis: Secondary | ICD-10-CM | POA: Diagnosis not present

## 2019-08-31 DIAGNOSIS — D509 Iron deficiency anemia, unspecified: Secondary | ICD-10-CM | POA: Diagnosis not present

## 2019-08-31 DIAGNOSIS — N186 End stage renal disease: Secondary | ICD-10-CM | POA: Diagnosis not present

## 2019-08-31 DIAGNOSIS — N2581 Secondary hyperparathyroidism of renal origin: Secondary | ICD-10-CM | POA: Diagnosis not present

## 2019-09-02 DIAGNOSIS — D509 Iron deficiency anemia, unspecified: Secondary | ICD-10-CM | POA: Diagnosis not present

## 2019-09-02 DIAGNOSIS — E1129 Type 2 diabetes mellitus with other diabetic kidney complication: Secondary | ICD-10-CM | POA: Diagnosis not present

## 2019-09-02 DIAGNOSIS — D631 Anemia in chronic kidney disease: Secondary | ICD-10-CM | POA: Diagnosis not present

## 2019-09-02 DIAGNOSIS — D689 Coagulation defect, unspecified: Secondary | ICD-10-CM | POA: Diagnosis not present

## 2019-09-02 DIAGNOSIS — Z992 Dependence on renal dialysis: Secondary | ICD-10-CM | POA: Diagnosis not present

## 2019-09-02 DIAGNOSIS — R52 Pain, unspecified: Secondary | ICD-10-CM | POA: Diagnosis not present

## 2019-09-02 DIAGNOSIS — N2581 Secondary hyperparathyroidism of renal origin: Secondary | ICD-10-CM | POA: Diagnosis not present

## 2019-09-02 DIAGNOSIS — N186 End stage renal disease: Secondary | ICD-10-CM | POA: Diagnosis not present

## 2019-09-04 DIAGNOSIS — N2581 Secondary hyperparathyroidism of renal origin: Secondary | ICD-10-CM | POA: Diagnosis not present

## 2019-09-04 DIAGNOSIS — D689 Coagulation defect, unspecified: Secondary | ICD-10-CM | POA: Diagnosis not present

## 2019-09-04 DIAGNOSIS — D631 Anemia in chronic kidney disease: Secondary | ICD-10-CM | POA: Diagnosis not present

## 2019-09-04 DIAGNOSIS — Z992 Dependence on renal dialysis: Secondary | ICD-10-CM | POA: Diagnosis not present

## 2019-09-04 DIAGNOSIS — D509 Iron deficiency anemia, unspecified: Secondary | ICD-10-CM | POA: Diagnosis not present

## 2019-09-04 DIAGNOSIS — R52 Pain, unspecified: Secondary | ICD-10-CM | POA: Diagnosis not present

## 2019-09-04 DIAGNOSIS — E1129 Type 2 diabetes mellitus with other diabetic kidney complication: Secondary | ICD-10-CM | POA: Diagnosis not present

## 2019-09-04 DIAGNOSIS — N186 End stage renal disease: Secondary | ICD-10-CM | POA: Diagnosis not present

## 2019-09-07 DIAGNOSIS — D689 Coagulation defect, unspecified: Secondary | ICD-10-CM | POA: Diagnosis not present

## 2019-09-07 DIAGNOSIS — E1129 Type 2 diabetes mellitus with other diabetic kidney complication: Secondary | ICD-10-CM | POA: Diagnosis not present

## 2019-09-07 DIAGNOSIS — D631 Anemia in chronic kidney disease: Secondary | ICD-10-CM | POA: Diagnosis not present

## 2019-09-07 DIAGNOSIS — Z992 Dependence on renal dialysis: Secondary | ICD-10-CM | POA: Diagnosis not present

## 2019-09-07 DIAGNOSIS — R52 Pain, unspecified: Secondary | ICD-10-CM | POA: Diagnosis not present

## 2019-09-07 DIAGNOSIS — D509 Iron deficiency anemia, unspecified: Secondary | ICD-10-CM | POA: Diagnosis not present

## 2019-09-07 DIAGNOSIS — N186 End stage renal disease: Secondary | ICD-10-CM | POA: Diagnosis not present

## 2019-09-07 DIAGNOSIS — N2581 Secondary hyperparathyroidism of renal origin: Secondary | ICD-10-CM | POA: Diagnosis not present

## 2019-09-09 DIAGNOSIS — D689 Coagulation defect, unspecified: Secondary | ICD-10-CM | POA: Diagnosis not present

## 2019-09-09 DIAGNOSIS — E1165 Type 2 diabetes mellitus with hyperglycemia: Secondary | ICD-10-CM | POA: Diagnosis not present

## 2019-09-09 DIAGNOSIS — N186 End stage renal disease: Secondary | ICD-10-CM | POA: Diagnosis not present

## 2019-09-09 DIAGNOSIS — D509 Iron deficiency anemia, unspecified: Secondary | ICD-10-CM | POA: Diagnosis not present

## 2019-09-09 DIAGNOSIS — Z992 Dependence on renal dialysis: Secondary | ICD-10-CM | POA: Diagnosis not present

## 2019-09-09 DIAGNOSIS — I700 Atherosclerosis of aorta: Secondary | ICD-10-CM | POA: Diagnosis not present

## 2019-09-09 DIAGNOSIS — R52 Pain, unspecified: Secondary | ICD-10-CM | POA: Diagnosis not present

## 2019-09-09 DIAGNOSIS — N2581 Secondary hyperparathyroidism of renal origin: Secondary | ICD-10-CM | POA: Diagnosis not present

## 2019-09-09 DIAGNOSIS — D631 Anemia in chronic kidney disease: Secondary | ICD-10-CM | POA: Diagnosis not present

## 2019-09-09 DIAGNOSIS — E1122 Type 2 diabetes mellitus with diabetic chronic kidney disease: Secondary | ICD-10-CM | POA: Diagnosis not present

## 2019-09-09 DIAGNOSIS — E1129 Type 2 diabetes mellitus with other diabetic kidney complication: Secondary | ICD-10-CM | POA: Diagnosis not present

## 2019-09-15 ENCOUNTER — Emergency Department (HOSPITAL_COMMUNITY)
Admission: EM | Admit: 2019-09-15 | Discharge: 2019-09-16 | Disposition: A | Discharge status: Home / Self Care | Payer: Medicare Other | Attending: Emergency Medicine | Admitting: Emergency Medicine

## 2019-09-15 ENCOUNTER — Emergency Department (HOSPITAL_COMMUNITY): Payer: Medicare Other

## 2019-09-15 ENCOUNTER — Encounter (HOSPITAL_COMMUNITY): Payer: Self-pay | Admitting: Emergency Medicine

## 2019-09-15 ENCOUNTER — Other Ambulatory Visit: Payer: Self-pay

## 2019-09-15 DIAGNOSIS — E1122 Type 2 diabetes mellitus with diabetic chronic kidney disease: Secondary | ICD-10-CM | POA: Diagnosis not present

## 2019-09-15 DIAGNOSIS — R1013 Epigastric pain: Secondary | ICD-10-CM | POA: Diagnosis not present

## 2019-09-15 DIAGNOSIS — Z87891 Personal history of nicotine dependence: Secondary | ICD-10-CM | POA: Insufficient documentation

## 2019-09-15 DIAGNOSIS — R109 Unspecified abdominal pain: Secondary | ICD-10-CM | POA: Diagnosis not present

## 2019-09-15 DIAGNOSIS — I517 Cardiomegaly: Secondary | ICD-10-CM | POA: Diagnosis not present

## 2019-09-15 DIAGNOSIS — Z992 Dependence on renal dialysis: Secondary | ICD-10-CM | POA: Insufficient documentation

## 2019-09-15 DIAGNOSIS — J811 Chronic pulmonary edema: Secondary | ICD-10-CM | POA: Diagnosis not present

## 2019-09-15 DIAGNOSIS — Z79899 Other long term (current) drug therapy: Secondary | ICD-10-CM | POA: Insufficient documentation

## 2019-09-15 DIAGNOSIS — J90 Pleural effusion, not elsewhere classified: Secondary | ICD-10-CM | POA: Diagnosis not present

## 2019-09-15 DIAGNOSIS — I120 Hypertensive chronic kidney disease with stage 5 chronic kidney disease or end stage renal disease: Secondary | ICD-10-CM | POA: Diagnosis not present

## 2019-09-15 DIAGNOSIS — N186 End stage renal disease: Secondary | ICD-10-CM | POA: Insufficient documentation

## 2019-09-15 DIAGNOSIS — J9811 Atelectasis: Secondary | ICD-10-CM | POA: Diagnosis not present

## 2019-09-15 LAB — URINALYSIS, ROUTINE W REFLEX MICROSCOPIC
Bacteria, UA: NONE SEEN
Bilirubin Urine: NEGATIVE
Glucose, UA: 50 mg/dL — AB
Ketones, ur: 5 mg/dL — AB
Leukocytes,Ua: NEGATIVE
Nitrite: NEGATIVE
Protein, ur: >=300 mg/dL — AB
Specific Gravity, Urine: 1.011 (ref 1.005–1.030)
pH: 7 (ref 5.0–8.0)

## 2019-09-15 LAB — CBC
HCT: 34.1 % — ABNORMAL LOW (ref 39.0–52.0)
Hemoglobin: 11.2 g/dL — ABNORMAL LOW (ref 13.0–17.0)
MCH: 31.9 pg (ref 26.0–34.0)
MCHC: 32.8 g/dL (ref 30.0–36.0)
MCV: 97.2 fL (ref 80.0–100.0)
Platelets: 251 10*3/uL (ref 150–400)
RBC: 3.51 MIL/uL — ABNORMAL LOW (ref 4.22–5.81)
RDW: 14.7 % (ref 11.5–15.5)
WBC: 6.2 10*3/uL (ref 4.0–10.5)
nRBC: 0 % (ref 0.0–0.2)

## 2019-09-15 LAB — COMPREHENSIVE METABOLIC PANEL
ALT: 10 U/L (ref 0–44)
AST: 9 U/L — ABNORMAL LOW (ref 15–41)
Albumin: 4.3 g/dL (ref 3.5–5.0)
Alkaline Phosphatase: 56 U/L (ref 38–126)
Anion gap: 21 — ABNORMAL HIGH (ref 5–15)
BUN: 96 mg/dL — ABNORMAL HIGH (ref 8–23)
CO2: 16 mmol/L — ABNORMAL LOW (ref 22–32)
Calcium: 9.1 mg/dL (ref 8.9–10.3)
Chloride: 100 mmol/L (ref 98–111)
Creatinine, Ser: 19.05 mg/dL — ABNORMAL HIGH (ref 0.61–1.24)
GFR calc Af Amer: 3 mL/min — ABNORMAL LOW (ref >60)
GFR calc non Af Amer: 2 mL/min — ABNORMAL LOW (ref >60)
Glucose, Bld: 80 mg/dL (ref 70–99)
Potassium: 4.8 mmol/L (ref 3.5–5.1)
Sodium: 137 mmol/L (ref 135–145)
Total Bilirubin: 1.1 mg/dL (ref 0.3–1.2)
Total Protein: 8.5 g/dL — ABNORMAL HIGH (ref 6.5–8.1)

## 2019-09-15 LAB — LIPASE, BLOOD: Lipase: 97 U/L — ABNORMAL HIGH (ref 11–51)

## 2019-09-15 MED ORDER — SUCRALFATE 1 G PO TABS
1.0000 g | ORAL_TABLET | Freq: Three times a day (TID) | ORAL | 0 refills | Status: DC
Start: 2019-09-15 — End: 2021-03-20

## 2019-09-15 MED ORDER — LIDOCAINE VISCOUS HCL 2 % MT SOLN
15.0000 mL | Freq: Once | OROMUCOSAL | Status: AC
  Administered 2019-09-15: 15 mL via ORAL
  Filled 2019-09-15: qty 15

## 2019-09-15 MED ORDER — SODIUM CHLORIDE 0.9% FLUSH: 3.0000 mL | Freq: Once | INTRAVENOUS | Status: DC

## 2019-09-15 MED ORDER — PANTOPRAZOLE SODIUM 40 MG IV SOLR
40.0000 mg | Freq: Once | INTRAVENOUS | Status: AC
  Administered 2019-09-15: 40 mg via INTRAVENOUS
  Filled 2019-09-15: qty 40

## 2019-09-15 MED ORDER — HYDROMORPHONE HCL 1 MG/ML IJ SOLN
1.0000 mg | Freq: Once | INTRAMUSCULAR | Status: AC
  Administered 2019-09-15: 1 mg via INTRAVENOUS
  Filled 2019-09-15: qty 1

## 2019-09-15 MED ORDER — ONDANSETRON HCL 4 MG/2ML IJ SOLN
4.0000 mg | Freq: Once | INTRAMUSCULAR | Status: AC
  Administered 2019-09-15: 4 mg via INTRAVENOUS
  Filled 2019-09-15: qty 2

## 2019-09-15 MED ORDER — PANTOPRAZOLE SODIUM 20 MG PO TBEC
20.0000 mg | DELAYED_RELEASE_TABLET | Freq: Every day | ORAL | 1 refills | Status: DC
Start: 2019-09-15 — End: 2021-03-20

## 2019-09-15 MED ORDER — ALUM & MAG HYDROXIDE-SIMETH 200-200-20 MG/5ML PO SUSP
30.0000 mL | Freq: Once | ORAL | Status: AC
  Administered 2019-09-15: 30 mL via ORAL
  Filled 2019-09-15: qty 30

## 2019-09-15 MED ORDER — HYDROCODONE-ACETAMINOPHEN 5-325 MG PO TABS: 1.0000 | ORAL_TABLET | Freq: Three times a day (TID) | ORAL | 0 refills | Status: DC | PRN

## 2019-09-15 MED ORDER — HYDROCODONE-ACETAMINOPHEN 5-325 MG PO TABS: 1.0000 | ORAL_TABLET | Freq: Three times a day (TID) | ORAL | 0 refills | Status: AC | PRN

## 2019-09-15 MED ORDER — PROMETHAZINE HCL 25 MG RE SUPP
25.0000 mg | Freq: Three times a day (TID) | RECTAL | 0 refills | Status: DC | PRN
Start: 2019-09-15 — End: 2021-03-20

## 2019-09-15 NOTE — ED Triage Notes (Signed)
Patient arrives to ED with complaints of generalized abdominal pain and emesis for the past week. Patient states it is usually when his chronic pancreatitis acts up. Patient has missed his last two dialysis treatments due to him being sick.

## 2019-09-15 NOTE — ED Provider Notes (Addendum)
[facility] EMERGENCY DEPARTMENT Provider Note   CSN: [id number] Arrival date & time: 09/15/19  1200     History Chief Complaint  Patient presents with  . Abdominal Pain  . Emesis    Jonathan Mcneil is a 64 y.o. male.  HPI     64 year old male comes in a chief complaint of abdominal pain and vomiting.  Patient has history of ESRD on HD, he has missed the last 2 dialysis session because of his abdominal pain and vomiting.  He also has history of diabetes.  Patient alleges that over the last few months he has been having intermittent episodes of abdominal pain.  The pain is unprovoked.  This current episode started 5 days ago.  Pain is epigastric and nonradiating.  Patient has had some bloody emesis.  Patient denies any history of pancreatitis.  He has been seen in the ER with elevated lipase and has been told that he might have pancreatitis.  He denies any gallstone issues or heavy alcohol use.  Patient denies any history of upper endoscopy.  Past Medical History:  Diagnosis Date  . Diabetes mellitus without complication ([place])   . ESRD (end stage renal disease) ([place])   . GERD (gastroesophageal reflux disease)    PMH  . Hypertension   . Low back pain   . Metabolic acidosis   . Neuromuscular disorder ([place])    peripheral neuropathy  . Pancreatitis   . Schizophrenia ([place])    does not take medications    Patient Active Problem List   Diagnosis Date Noted  . Hyperkalemia   . Acute pancreatitis 07/26/2019  . Uremia 02/24/2018  . History of anemia due to CKD 02/24/2018  . Marijuana abuse 02/24/2018  . ESRD (end stage renal disease) ([place])   . Hypertension   . Schizophrenia ([place])   . Metabolic acidosis 80/32/1224  . Hypertensive urgency 01/15/2018  . Type 2 diabetes mellitus with stage 4 chronic kidney disease ([place]) 01/15/2018  . Acute encephalopathy 02/15/2015  . CKD (chronic kidney disease) 02/14/2015  . Acute kidney injury superimposed on chronic  kidney disease ([place]) 02/14/2015  . Hyponatremia 02/14/2015  . Back pain 02/14/2015  . Neuropathy 02/14/2015  . Obesity 02/14/2015  . Abdominal pain 02/14/2015  . Pancreatitis   . Diabetes mellitus without complication ([place])   . Pancreatitis, acute   . Schizo-affective psychosis ([place]) 04/28/2013    Past Surgical History:  Procedure Laterality Date  . A/V FISTULAGRAM Left 06/03/2018   Procedure: A/V FISTULAGRAM;  Surgeon: Marty Heck, MD;  Location: [facility];  Service: Cardiovascular;  Laterality: Left;  . AV FISTULA PLACEMENT Left 02/11/2018   Procedure: INSERTION OF ARTERIOVENOUS (AV) FISTULA LEFT  ARM;  Surgeon: [name], MD;  Location: [facility];  Service: Vascular;  Laterality: Left;  . BASCILIC VEIN TRANSPOSITION Left 04/17/2018   Procedure: BASILIC VEIN TRANSPOSITION SECOND STAGE;  Surgeon: [name], MD;  Location: [facility];  Service: Vascular;  Laterality: Left;  . COLONOSCOPY    . DENTAL SURGERY    . INSERTION OF DIALYSIS CATHETER Right 02/25/2018   Procedure: INSERTION OF DIALYSIS CATHETER;  Surgeon: [name], MD;  Location: [facility];  Service: Vascular;  Laterality: Right;  . PERIPHERAL VASCULAR BALLOON ANGIOPLASTY  06/03/2018   Procedure: PERIPHERAL VASCULAR BALLOON ANGIOPLASTY;  Surgeon: Marty Heck, MD;  Location: [facility];  Service: Cardiovascular;;  left a/v fistula       Family History  Problem  Relation Age of Onset  . Kidney failure Mother     Social History   Tobacco Use  . Smoking status: Former Research scientist (life sciences)  . Smokeless tobacco: Never Used  Vaping Use  . Vaping Use: Never used  Substance Use Topics  . Alcohol use: No  . Drug use: Not Currently    Types: Cocaine, Marijuana    Comment: smoked marijuana a few days ago; he denies using cocaine    Home Medications Prior to Admission medications   Medication Sig Start Date End Date Taking? Authorizing Provider  acetaminophen (TYLENOL)  650 MG CR tablet Take 1,300 mg by mouth 2 (two) times daily as needed for pain.   Yes [provider]  amLODipine (NORVASC) 10 MG tablet Take 10 mg by mouth at bedtime.  09/24/17  Yes [provider]  Multiple Vitamins-Minerals (MULTIVITAMIN) LIQD Take 32 oz by mouth daily.   Yes [provider]  calcitRIOL (ROCALTROL) 0.25 MCG capsule Take 1 capsule (0.25 mcg total) by mouth every Monday, Wednesday, and Friday with hemodialysis. Patient not taking: Reported on 09/15/2019 03/02/18   Lavina Hamman, MD  gabapentin (NEURONTIN) 300 MG capsule Take 300 mg by mouth See admin instructions. Take one capsule (300 mg) by mouth on Tuesday, Thursday, Saturday after dialysis Patient not taking: Reported on 09/15/2019 05/25/18   [provider]    Allergies    Ibuprofen and Penicillins  Review of Systems   Review of Systems  Constitutional: Positive for activity change.  Gastrointestinal: Positive for abdominal pain.  All other systems reviewed and are negative.   Physical Exam Updated Vital Signs BP (!) 168/98 (BP Location: Right Arm)   Pulse 74   Temp 98.9 F (37.2 C) (Oral)   Resp 20   Ht 5\' 8"  (1.727 m)   Wt 99.8 kg   SpO2 100%   BMI 33.45 kg/m   Physical Exam Vitals and nursing note reviewed.  Constitutional:      Appearance: He is well-developed.  HENT:     Head: Atraumatic.  Cardiovascular:     Rate and Rhythm: Normal rate.  Pulmonary:     Effort: Pulmonary effort is normal.  Abdominal:     Tenderness: There is abdominal tenderness in the epigastric area. There is guarding. There is no rebound. Negative signs include Murphy's sign.  Musculoskeletal:     Cervical back: Neck supple.  Skin:    General: Skin is warm.  Neurological:     Mental Status: He is alert and oriented to person, place, and time.     ED Results / Procedures / Treatments   Labs (all labs ordered are listed, but only abnormal results are displayed) Labs Reviewed    LIPASE, BLOOD - Abnormal; Notable for the following components:      Result Value   Lipase 97 (*)    All other components within normal limits  COMPREHENSIVE METABOLIC PANEL - Abnormal; Notable for the following components:   CO2 16 (*)    BUN 96 (*)    Creatinine, Ser 19.05 (*)    Total Protein 8.5 (*)    AST 9 (*)    GFR calc non Af Amer 2 (*)    GFR calc Af Amer 3 (*)    Anion gap 21 (*)    All other components within normal limits  CBC - Abnormal; Notable for the following components:   RBC 3.51 (*)    Hemoglobin 11.2 (*)    HCT 34.1 (*)  All other components within normal limits  URINALYSIS, ROUTINE W REFLEX MICROSCOPIC - Abnormal; Notable for the following components:   Color, Urine STRAW (*)    Glucose, UA 50 (*)    Hgb urine dipstick SMALL (*)    Ketones, ur 5 (*)    Protein, ur >=300 (*)    All other components within normal limits    EKG None  Radiology No results found.  Procedures Procedures (including critical care time)  Medications Ordered in ED Medications  sodium chloride flush (NS) 0.9 % injection 3 mL (has no administration in time range)  HYDROmorphone (DILAUDID) injection 1 mg (1 mg Intravenous Given 09/15/19 2212)  ondansetron (ZOFRAN) injection 4 mg (4 mg Intravenous Given 09/15/19 2214)  pantoprazole (PROTONIX) injection 40 mg (40 mg Intravenous Given 09/15/19 2219)  alum & mag hydroxide-simeth (MAALOX/MYLANTA) 200-200-20 MG/5ML suspension 30 mL (30 mLs Oral Given 09/15/19 2219)    And  lidocaine (XYLOCAINE) 2 % viscous mouth solution 15 mL (15 mLs Oral Given 09/15/19 2219)    ED Course  I have reviewed the triage vital signs and the nursing notes.  Pertinent labs & imaging results that were available during my care of the patient were reviewed by me and considered in my medical decision making (see chart for details).    MDM Rules/Calculators/A&P                          64 year old comes in a chief complaint of abdominal pain.  He is  having epigastric abdominal pain with nausea and vomiting.  Patient reports few episodes of hematemesis.  He has had about 5-10 episodes of emesis in the last 24 hours.  Patient has been told that he has pancreatitis.  He however denies any alcohol use and has no gallstones.  I reviewed 2 CT scans within the last 1 year, and neither of them had any signs of pancreatitis.  My suspicion is that patient might actually be having gastritis, peptic ulcer disease.  Clinical suspicion for mesenteric ischemia is low as the pain is not diffuse.   Patient's lab in the ER look reassuring -besides lipase which is 97.  He has bicarb of 16, with glucose of 80 and anion gap of 21.  The anion gap acidosis is likely not related to DKA.  Plan is to get symptoms in better control. I also think patient will need GI follow-up because he could be having peptic ulcer disease with mildly elevated lipase, that is being confused as pancreatitis.  Final Clinical Impression(s) / ED Diagnoses Final diagnoses:  Epigastric abdominal pain    Rx / DC Orders ED Discharge Orders    None       Varney Biles, MD 09/15/19 [place], Greig Altergott, MD 09/15/19 2222

## 2019-09-15 NOTE — Discharge Instructions (Addendum)
I sent a message to our social worker to see if they can help arrange a ride for you to get your dialysis..  Please take the medications prescribed for suspected gastritis.  These follow-up with the GI doctors as well.  Please return to the ER if your symptoms worsen; you have increased pain, fevers, chills, inability to keep any medications down, confusion. Otherwise see the outpatient doctor as requested.

## 2019-09-16 MED ORDER — METOCLOPRAMIDE HCL 5 MG/ML IJ SOLN
10.0000 mg | Freq: Once | INTRAMUSCULAR | Status: AC
  Administered 2019-09-16: 10 mg via INTRAVENOUS
  Filled 2019-09-16: qty 2

## 2019-09-16 NOTE — ED Notes (Signed)
Pt continues to c/o nausea and states that he may not be able to attend dialysis today due to nausea.

## 2019-09-16 NOTE — ED Provider Notes (Signed)
I assumed care of this patient.  Please see previous provider note for further details of Hx, PE.  Briefly patient is a 64 y.o. male who presented upper abdominal pain and emesis.   Patient's work-up was grossly reassuring.  Elevated lipase is lower than his usual.  Patient has a history of chronic pancreatitis with negative CT scan in the past.  Plan was to p.o. challenge and reassess.  After Zofran, patient still felt nauseated and had another episode of nonbloody nonbilious emesis.  Given his history of ESRD on dialysis and diabetes, also consider gastroparesis.  Patient was treated with Reglan, which provided patient with significant relief and he was able to tolerate oral intake.  Patient felt more comfortable with being discharged.  We recommended patient go to his dialysis session in the morning.  Dr. Kathrynn Humble has already reached out to the dialysis NP to arrange transportation.  The patient appears reasonably screened and/or stabilized for discharge and I doubt any other medical condition or other [facility] requiring further screening, evaluation, or treatment in the ED at this time prior to discharge. Safe for discharge with strict return precautions.  Disposition: Discharge  Condition: Good  I have discussed the results, Dx and Tx plan with the patient/family who expressed understanding and agree(s) with the plan. Discharge instructions discussed at length. The patient/family was given strict return precautions who verbalized understanding of the instructions. No further questions at time of discharge.    ED Discharge Orders         Ordered    pantoprazole (PROTONIX) 20 MG tablet  Daily     Discontinue  Reprint     09/15/19 2314    sucralfate (CARAFATE) 1 g tablet  3 times daily with meals & bedtime     Discontinue  Reprint     09/15/19 2314    HYDROcodone-acetaminophen (NORCO/VICODIN) 5-325 MG tablet  Every 8 hours PRN,   Status:  Discontinued     Reprint     09/15/19 2314     promethazine (PHENERGAN) 25 MG suppository  Every 8 hours PRN     Discontinue  Reprint     09/15/19 2314    HYDROcodone-acetaminophen (NORCO/VICODIN) 5-325 MG tablet  Every 8 hours PRN     Discontinue  Reprint     09/15/19 [place] narcotic database reviewed and no active prescriptions noted.   Follow Up: Gastroenterology, [address] [place] [phone]  In 1 week   Dialysis  Go today as schedule or reschedule for this afternoon if possible.         Fatima Blank, MD 09/16/19 [phone]

## 2019-09-16 NOTE — ED Notes (Signed)
Patient states that he is feeling much better.

## 2019-09-18 DIAGNOSIS — D689 Coagulation defect, unspecified: Secondary | ICD-10-CM | POA: Diagnosis not present

## 2019-09-18 DIAGNOSIS — N2581 Secondary hyperparathyroidism of renal origin: Secondary | ICD-10-CM | POA: Diagnosis not present

## 2019-09-18 DIAGNOSIS — R52 Pain, unspecified: Secondary | ICD-10-CM | POA: Diagnosis not present

## 2019-09-18 DIAGNOSIS — D509 Iron deficiency anemia, unspecified: Secondary | ICD-10-CM | POA: Diagnosis not present

## 2019-09-18 DIAGNOSIS — N186 End stage renal disease: Secondary | ICD-10-CM | POA: Diagnosis not present

## 2019-09-18 DIAGNOSIS — D631 Anemia in chronic kidney disease: Secondary | ICD-10-CM | POA: Diagnosis not present

## 2019-09-18 DIAGNOSIS — Z992 Dependence on renal dialysis: Secondary | ICD-10-CM | POA: Diagnosis not present

## 2019-09-18 DIAGNOSIS — E1129 Type 2 diabetes mellitus with other diabetic kidney complication: Secondary | ICD-10-CM | POA: Diagnosis not present

## 2019-09-21 DIAGNOSIS — D509 Iron deficiency anemia, unspecified: Secondary | ICD-10-CM | POA: Diagnosis not present

## 2019-09-21 DIAGNOSIS — N186 End stage renal disease: Secondary | ICD-10-CM | POA: Diagnosis not present

## 2019-09-21 DIAGNOSIS — D631 Anemia in chronic kidney disease: Secondary | ICD-10-CM | POA: Diagnosis not present

## 2019-09-21 DIAGNOSIS — N2581 Secondary hyperparathyroidism of renal origin: Secondary | ICD-10-CM | POA: Diagnosis not present

## 2019-09-21 DIAGNOSIS — E1129 Type 2 diabetes mellitus with other diabetic kidney complication: Secondary | ICD-10-CM | POA: Diagnosis not present

## 2019-09-21 DIAGNOSIS — Z992 Dependence on renal dialysis: Secondary | ICD-10-CM | POA: Diagnosis not present

## 2019-09-21 DIAGNOSIS — D689 Coagulation defect, unspecified: Secondary | ICD-10-CM | POA: Diagnosis not present

## 2019-09-21 DIAGNOSIS — R52 Pain, unspecified: Secondary | ICD-10-CM | POA: Diagnosis not present

## 2019-09-22 DIAGNOSIS — E1122 Type 2 diabetes mellitus with diabetic chronic kidney disease: Secondary | ICD-10-CM | POA: Diagnosis not present

## 2019-09-22 DIAGNOSIS — R1084 Generalized abdominal pain: Secondary | ICD-10-CM | POA: Diagnosis not present

## 2019-09-22 DIAGNOSIS — N186 End stage renal disease: Secondary | ICD-10-CM | POA: Diagnosis not present

## 2019-09-22 DIAGNOSIS — Z992 Dependence on renal dialysis: Secondary | ICD-10-CM | POA: Diagnosis not present

## 2019-09-22 DIAGNOSIS — K859 Acute pancreatitis without necrosis or infection, unspecified: Secondary | ICD-10-CM | POA: Diagnosis not present

## 2019-09-22 DIAGNOSIS — R112 Nausea with vomiting, unspecified: Secondary | ICD-10-CM | POA: Diagnosis not present

## 2019-09-23 DIAGNOSIS — R1084 Generalized abdominal pain: Secondary | ICD-10-CM | POA: Diagnosis not present

## 2019-09-25 DIAGNOSIS — N2581 Secondary hyperparathyroidism of renal origin: Secondary | ICD-10-CM | POA: Diagnosis not present

## 2019-09-25 DIAGNOSIS — E1129 Type 2 diabetes mellitus with other diabetic kidney complication: Secondary | ICD-10-CM | POA: Diagnosis not present

## 2019-09-25 DIAGNOSIS — Z992 Dependence on renal dialysis: Secondary | ICD-10-CM | POA: Diagnosis not present

## 2019-09-25 DIAGNOSIS — D509 Iron deficiency anemia, unspecified: Secondary | ICD-10-CM | POA: Diagnosis not present

## 2019-09-25 DIAGNOSIS — D689 Coagulation defect, unspecified: Secondary | ICD-10-CM | POA: Diagnosis not present

## 2019-09-25 DIAGNOSIS — R52 Pain, unspecified: Secondary | ICD-10-CM | POA: Diagnosis not present

## 2019-09-25 DIAGNOSIS — D631 Anemia in chronic kidney disease: Secondary | ICD-10-CM | POA: Diagnosis not present

## 2019-09-25 DIAGNOSIS — N186 End stage renal disease: Secondary | ICD-10-CM | POA: Diagnosis not present

## 2019-09-28 DIAGNOSIS — Z992 Dependence on renal dialysis: Secondary | ICD-10-CM | POA: Diagnosis not present

## 2019-09-28 DIAGNOSIS — D631 Anemia in chronic kidney disease: Secondary | ICD-10-CM | POA: Diagnosis not present

## 2019-09-28 DIAGNOSIS — N2581 Secondary hyperparathyroidism of renal origin: Secondary | ICD-10-CM | POA: Diagnosis not present

## 2019-09-28 DIAGNOSIS — E1129 Type 2 diabetes mellitus with other diabetic kidney complication: Secondary | ICD-10-CM | POA: Diagnosis not present

## 2019-09-28 DIAGNOSIS — N186 End stage renal disease: Secondary | ICD-10-CM | POA: Diagnosis not present

## 2019-09-28 DIAGNOSIS — D509 Iron deficiency anemia, unspecified: Secondary | ICD-10-CM | POA: Diagnosis not present

## 2019-09-28 DIAGNOSIS — D689 Coagulation defect, unspecified: Secondary | ICD-10-CM | POA: Diagnosis not present

## 2019-09-28 DIAGNOSIS — R52 Pain, unspecified: Secondary | ICD-10-CM | POA: Diagnosis not present

## 2019-09-30 DIAGNOSIS — D509 Iron deficiency anemia, unspecified: Secondary | ICD-10-CM | POA: Diagnosis not present

## 2019-09-30 DIAGNOSIS — D631 Anemia in chronic kidney disease: Secondary | ICD-10-CM | POA: Diagnosis not present

## 2019-09-30 DIAGNOSIS — N2581 Secondary hyperparathyroidism of renal origin: Secondary | ICD-10-CM | POA: Diagnosis not present

## 2019-09-30 DIAGNOSIS — Z992 Dependence on renal dialysis: Secondary | ICD-10-CM | POA: Diagnosis not present

## 2019-09-30 DIAGNOSIS — E1129 Type 2 diabetes mellitus with other diabetic kidney complication: Secondary | ICD-10-CM | POA: Diagnosis not present

## 2019-09-30 DIAGNOSIS — D689 Coagulation defect, unspecified: Secondary | ICD-10-CM | POA: Diagnosis not present

## 2019-09-30 DIAGNOSIS — N186 End stage renal disease: Secondary | ICD-10-CM | POA: Diagnosis not present

## 2019-09-30 DIAGNOSIS — R52 Pain, unspecified: Secondary | ICD-10-CM | POA: Diagnosis not present

## 2019-10-02 DIAGNOSIS — R52 Pain, unspecified: Secondary | ICD-10-CM | POA: Diagnosis not present

## 2019-10-02 DIAGNOSIS — D631 Anemia in chronic kidney disease: Secondary | ICD-10-CM | POA: Diagnosis not present

## 2019-10-02 DIAGNOSIS — N2581 Secondary hyperparathyroidism of renal origin: Secondary | ICD-10-CM | POA: Diagnosis not present

## 2019-10-02 DIAGNOSIS — N186 End stage renal disease: Secondary | ICD-10-CM | POA: Diagnosis not present

## 2019-10-02 DIAGNOSIS — D689 Coagulation defect, unspecified: Secondary | ICD-10-CM | POA: Diagnosis not present

## 2019-10-02 DIAGNOSIS — Z992 Dependence on renal dialysis: Secondary | ICD-10-CM | POA: Diagnosis not present

## 2019-10-02 DIAGNOSIS — E1129 Type 2 diabetes mellitus with other diabetic kidney complication: Secondary | ICD-10-CM | POA: Diagnosis not present

## 2019-10-02 DIAGNOSIS — D509 Iron deficiency anemia, unspecified: Secondary | ICD-10-CM | POA: Diagnosis not present

## 2019-10-05 DIAGNOSIS — N186 End stage renal disease: Secondary | ICD-10-CM | POA: Diagnosis not present

## 2019-10-05 DIAGNOSIS — D509 Iron deficiency anemia, unspecified: Secondary | ICD-10-CM | POA: Diagnosis not present

## 2019-10-05 DIAGNOSIS — D689 Coagulation defect, unspecified: Secondary | ICD-10-CM | POA: Diagnosis not present

## 2019-10-05 DIAGNOSIS — N2581 Secondary hyperparathyroidism of renal origin: Secondary | ICD-10-CM | POA: Diagnosis not present

## 2019-10-05 DIAGNOSIS — R52 Pain, unspecified: Secondary | ICD-10-CM | POA: Diagnosis not present

## 2019-10-05 DIAGNOSIS — E1129 Type 2 diabetes mellitus with other diabetic kidney complication: Secondary | ICD-10-CM | POA: Diagnosis not present

## 2019-10-05 DIAGNOSIS — Z992 Dependence on renal dialysis: Secondary | ICD-10-CM | POA: Diagnosis not present

## 2019-10-05 DIAGNOSIS — D631 Anemia in chronic kidney disease: Secondary | ICD-10-CM | POA: Diagnosis not present

## 2019-10-07 DIAGNOSIS — N186 End stage renal disease: Secondary | ICD-10-CM | POA: Diagnosis not present

## 2019-10-07 DIAGNOSIS — D631 Anemia in chronic kidney disease: Secondary | ICD-10-CM | POA: Diagnosis not present

## 2019-10-07 DIAGNOSIS — D509 Iron deficiency anemia, unspecified: Secondary | ICD-10-CM | POA: Diagnosis not present

## 2019-10-07 DIAGNOSIS — E1129 Type 2 diabetes mellitus with other diabetic kidney complication: Secondary | ICD-10-CM | POA: Diagnosis not present

## 2019-10-07 DIAGNOSIS — D689 Coagulation defect, unspecified: Secondary | ICD-10-CM | POA: Diagnosis not present

## 2019-10-07 DIAGNOSIS — Z992 Dependence on renal dialysis: Secondary | ICD-10-CM | POA: Diagnosis not present

## 2019-10-07 DIAGNOSIS — N2581 Secondary hyperparathyroidism of renal origin: Secondary | ICD-10-CM | POA: Diagnosis not present

## 2019-10-07 DIAGNOSIS — R52 Pain, unspecified: Secondary | ICD-10-CM | POA: Diagnosis not present

## 2019-10-08 DIAGNOSIS — K920 Hematemesis: Secondary | ICD-10-CM | POA: Diagnosis not present

## 2019-10-08 DIAGNOSIS — Z1211 Encounter for screening for malignant neoplasm of colon: Secondary | ICD-10-CM | POA: Diagnosis not present

## 2019-10-08 DIAGNOSIS — N186 End stage renal disease: Secondary | ICD-10-CM | POA: Diagnosis not present

## 2019-10-08 DIAGNOSIS — R112 Nausea with vomiting, unspecified: Secondary | ICD-10-CM | POA: Diagnosis not present

## 2019-10-08 DIAGNOSIS — R1013 Epigastric pain: Secondary | ICD-10-CM | POA: Diagnosis not present

## 2019-10-09 DIAGNOSIS — D509 Iron deficiency anemia, unspecified: Secondary | ICD-10-CM | POA: Diagnosis not present

## 2019-10-09 DIAGNOSIS — N186 End stage renal disease: Secondary | ICD-10-CM | POA: Diagnosis not present

## 2019-10-09 DIAGNOSIS — Z992 Dependence on renal dialysis: Secondary | ICD-10-CM | POA: Diagnosis not present

## 2019-10-09 DIAGNOSIS — R52 Pain, unspecified: Secondary | ICD-10-CM | POA: Diagnosis not present

## 2019-10-09 DIAGNOSIS — N2581 Secondary hyperparathyroidism of renal origin: Secondary | ICD-10-CM | POA: Diagnosis not present

## 2019-10-09 DIAGNOSIS — D631 Anemia in chronic kidney disease: Secondary | ICD-10-CM | POA: Diagnosis not present

## 2019-10-09 DIAGNOSIS — D689 Coagulation defect, unspecified: Secondary | ICD-10-CM | POA: Diagnosis not present

## 2019-10-09 DIAGNOSIS — E1129 Type 2 diabetes mellitus with other diabetic kidney complication: Secondary | ICD-10-CM | POA: Diagnosis not present

## 2019-10-11 DIAGNOSIS — E1142 Type 2 diabetes mellitus with diabetic polyneuropathy: Secondary | ICD-10-CM | POA: Diagnosis not present

## 2019-10-11 DIAGNOSIS — R112 Nausea with vomiting, unspecified: Secondary | ICD-10-CM | POA: Diagnosis not present

## 2019-10-11 DIAGNOSIS — E1165 Type 2 diabetes mellitus with hyperglycemia: Secondary | ICD-10-CM | POA: Diagnosis not present

## 2019-10-11 DIAGNOSIS — R1084 Generalized abdominal pain: Secondary | ICD-10-CM | POA: Diagnosis not present

## 2019-10-11 DIAGNOSIS — R1110 Vomiting, unspecified: Secondary | ICD-10-CM | POA: Diagnosis not present

## 2019-10-12 DIAGNOSIS — D509 Iron deficiency anemia, unspecified: Secondary | ICD-10-CM | POA: Diagnosis not present

## 2019-10-12 DIAGNOSIS — E1129 Type 2 diabetes mellitus with other diabetic kidney complication: Secondary | ICD-10-CM | POA: Diagnosis not present

## 2019-10-12 DIAGNOSIS — N2581 Secondary hyperparathyroidism of renal origin: Secondary | ICD-10-CM | POA: Diagnosis not present

## 2019-10-12 DIAGNOSIS — N186 End stage renal disease: Secondary | ICD-10-CM | POA: Diagnosis not present

## 2019-10-12 DIAGNOSIS — R52 Pain, unspecified: Secondary | ICD-10-CM | POA: Diagnosis not present

## 2019-10-12 DIAGNOSIS — D631 Anemia in chronic kidney disease: Secondary | ICD-10-CM | POA: Diagnosis not present

## 2019-10-12 DIAGNOSIS — D689 Coagulation defect, unspecified: Secondary | ICD-10-CM | POA: Diagnosis not present

## 2019-10-12 DIAGNOSIS — Z992 Dependence on renal dialysis: Secondary | ICD-10-CM | POA: Diagnosis not present

## 2019-10-14 DIAGNOSIS — D689 Coagulation defect, unspecified: Secondary | ICD-10-CM | POA: Diagnosis not present

## 2019-10-14 DIAGNOSIS — E1129 Type 2 diabetes mellitus with other diabetic kidney complication: Secondary | ICD-10-CM | POA: Diagnosis not present

## 2019-10-14 DIAGNOSIS — N2581 Secondary hyperparathyroidism of renal origin: Secondary | ICD-10-CM | POA: Diagnosis not present

## 2019-10-14 DIAGNOSIS — Z992 Dependence on renal dialysis: Secondary | ICD-10-CM | POA: Diagnosis not present

## 2019-10-14 DIAGNOSIS — D509 Iron deficiency anemia, unspecified: Secondary | ICD-10-CM | POA: Diagnosis not present

## 2019-10-14 DIAGNOSIS — D631 Anemia in chronic kidney disease: Secondary | ICD-10-CM | POA: Diagnosis not present

## 2019-10-14 DIAGNOSIS — R52 Pain, unspecified: Secondary | ICD-10-CM | POA: Diagnosis not present

## 2019-10-14 DIAGNOSIS — N186 End stage renal disease: Secondary | ICD-10-CM | POA: Diagnosis not present

## 2019-10-19 ENCOUNTER — Encounter: Payer: Self-pay | Admitting: Gastroenterology

## 2019-10-19 ENCOUNTER — Other Ambulatory Visit: Payer: Self-pay

## 2019-10-19 ENCOUNTER — Observation Stay (HOSPITAL_COMMUNITY)
Admission: EM | Admit: 2019-10-19 | Discharge: 2019-10-20 | Disposition: A | Discharge status: Home / Self Care | Payer: Medicare Other | Attending: Internal Medicine | Admitting: Internal Medicine

## 2019-10-19 DIAGNOSIS — Z7984 Long term (current) use of oral hypoglycemic drugs: Secondary | ICD-10-CM | POA: Insufficient documentation

## 2019-10-19 DIAGNOSIS — D649 Anemia, unspecified: Secondary | ICD-10-CM | POA: Diagnosis not present

## 2019-10-19 DIAGNOSIS — K317 Polyp of stomach and duodenum: Secondary | ICD-10-CM | POA: Insufficient documentation

## 2019-10-19 DIAGNOSIS — Z87891 Personal history of nicotine dependence: Secondary | ICD-10-CM | POA: Diagnosis not present

## 2019-10-19 DIAGNOSIS — E1122 Type 2 diabetes mellitus with diabetic chronic kidney disease: Secondary | ICD-10-CM | POA: Insufficient documentation

## 2019-10-19 DIAGNOSIS — D509 Iron deficiency anemia, unspecified: Secondary | ICD-10-CM | POA: Diagnosis not present

## 2019-10-19 DIAGNOSIS — R112 Nausea with vomiting, unspecified: Secondary | ICD-10-CM | POA: Diagnosis present

## 2019-10-19 DIAGNOSIS — Z79899 Other long term (current) drug therapy: Secondary | ICD-10-CM | POA: Insufficient documentation

## 2019-10-19 DIAGNOSIS — F1210 Cannabis abuse, uncomplicated: Secondary | ICD-10-CM | POA: Insufficient documentation

## 2019-10-19 DIAGNOSIS — E876 Hypokalemia: Secondary | ICD-10-CM | POA: Insufficient documentation

## 2019-10-19 DIAGNOSIS — I120 Hypertensive chronic kidney disease with stage 5 chronic kidney disease or end stage renal disease: Secondary | ICD-10-CM | POA: Insufficient documentation

## 2019-10-19 DIAGNOSIS — Z20822 Contact with and (suspected) exposure to covid-19: Secondary | ICD-10-CM | POA: Insufficient documentation

## 2019-10-19 DIAGNOSIS — R1084 Generalized abdominal pain: Secondary | ICD-10-CM | POA: Diagnosis not present

## 2019-10-19 DIAGNOSIS — N186 End stage renal disease: Secondary | ICD-10-CM | POA: Diagnosis not present

## 2019-10-19 DIAGNOSIS — R52 Pain, unspecified: Secondary | ICD-10-CM | POA: Diagnosis not present

## 2019-10-19 DIAGNOSIS — I10 Essential (primary) hypertension: Secondary | ICD-10-CM | POA: Diagnosis present

## 2019-10-19 DIAGNOSIS — R109 Unspecified abdominal pain: Secondary | ICD-10-CM | POA: Diagnosis not present

## 2019-10-19 DIAGNOSIS — D689 Coagulation defect, unspecified: Secondary | ICD-10-CM | POA: Diagnosis not present

## 2019-10-19 DIAGNOSIS — N2581 Secondary hyperparathyroidism of renal origin: Secondary | ICD-10-CM | POA: Diagnosis not present

## 2019-10-19 DIAGNOSIS — E1129 Type 2 diabetes mellitus with other diabetic kidney complication: Secondary | ICD-10-CM | POA: Diagnosis not present

## 2019-10-19 DIAGNOSIS — Z992 Dependence on renal dialysis: Secondary | ICD-10-CM | POA: Diagnosis not present

## 2019-10-19 DIAGNOSIS — D631 Anemia in chronic kidney disease: Secondary | ICD-10-CM | POA: Diagnosis not present

## 2019-10-19 LAB — COMPREHENSIVE METABOLIC PANEL
ALT: 11 U/L (ref 0–44)
AST: 11 U/L — ABNORMAL LOW (ref 15–41)
Albumin: 3.9 g/dL (ref 3.5–5.0)
Alkaline Phosphatase: 58 U/L (ref 38–126)
Anion gap: 11 (ref 5–15)
BUN: 26 mg/dL — ABNORMAL HIGH (ref 8–23)
CO2: 30 mmol/L (ref 22–32)
Calcium: 8.7 mg/dL — ABNORMAL LOW (ref 8.9–10.3)
Chloride: 93 mmol/L — ABNORMAL LOW (ref 98–111)
Creatinine, Ser: 9.72 mg/dL — ABNORMAL HIGH (ref 0.61–1.24)
GFR calc Af Amer: 6 mL/min — ABNORMAL LOW (ref >60)
GFR calc non Af Amer: 5 mL/min — ABNORMAL LOW (ref >60)
Glucose, Bld: 83 mg/dL (ref 70–99)
Potassium: 3.2 mmol/L — ABNORMAL LOW (ref 3.5–5.1)
Sodium: 134 mmol/L — ABNORMAL LOW (ref 135–145)
Total Bilirubin: 1 mg/dL (ref 0.3–1.2)
Total Protein: 8 g/dL (ref 6.5–8.1)

## 2019-10-19 LAB — CBC
HCT: 26.9 % — ABNORMAL LOW (ref 39.0–52.0)
Hemoglobin: 8.8 g/dL — ABNORMAL LOW (ref 13.0–17.0)
MCH: 29.7 pg (ref 26.0–34.0)
MCHC: 32.7 g/dL (ref 30.0–36.0)
MCV: 90.9 fL (ref 80.0–100.0)
Platelets: 224 10*3/uL (ref 150–400)
RBC: 2.96 MIL/uL — ABNORMAL LOW (ref 4.22–5.81)
RDW: 14.6 % (ref 11.5–15.5)
WBC: 5.1 10*3/uL (ref 4.0–10.5)
nRBC: 0 % (ref 0.0–0.2)

## 2019-10-19 LAB — LIPASE, BLOOD: Lipase: 23 U/L (ref 11–51)

## 2019-10-19 MED ORDER — SODIUM CHLORIDE 0.9% FLUSH: 3.0000 mL | Freq: Once | INTRAVENOUS | Status: DC

## 2019-10-19 NOTE — ED Notes (Signed)
Called for triage and unable to find.

## 2019-10-19 NOTE — Progress Notes (Unsigned)
Pt seen 10 days ago in our office for chron abd pain, n, v, wt loss, and was s/u for egd today.  Pt had dialysis earlier today and is weak, pain 9/10, can barely walk into the unit.  I do not feel pt is appropriate for outpt egd eval.    Have spoken w/ ER physician who thinks that pt would be appropriate for inpt mgt IF we (GI) can see him (pt is not unassigned, since we have seen him in the office, so we will plan to see him consultatively if he gets admitted) AND if egd can be done tomorrow (I have confirmed availability of endo unit and a slot for 10:30 a.m. on Wednesday is being held for him).  I am directing the patient over to the [facility] ED with the hope that the above-outlined plan will work out.  Cleotis Nipper, M.D. Pager [phone] If no answer or after 5 PM call [phone]

## 2019-10-19 NOTE — ED Triage Notes (Signed)
Pt was sent over here for admission in order to get an EGD tomorrow am. Pt was seen at [facility] outpatient office today for EGD due to weight loss abd pain but was considered to weak for this and was sent over to be admitted and have them consult . Pt is dialysis pt and had full treatment today.

## 2019-10-19 NOTE — ED Provider Notes (Signed)
[facility] EMERGENCY DEPARTMENT Provider Note   CSN: [id number] Arrival date & time: 10/19/19  1508     History Chief Complaint  Patient presents with  . admission    Jonathan Mcneil is a 64 y.o. male.  64 y.o male with a PMH of ESRD, DM, HTN presents to the ED sent in by gastroenterology for admission. Patient was evaluated by Dr. Cristina Gong in office today, he has had 30 lbs unintentional weight loss in the past 2 weeks. He reports being unable to eat solids and has switched his diet to soft foods including salmon, apple sauce. Patient endorses a couple episode of emesis with streak of blood. He is a dialysis patient who currently makes urine, last dialyzed today. He also endorses generalized abdominal pain describing it as dull with radiation to his back. Endorses shortness of breath. No fever, no chest pain, no blood in his stool.    The history is provided by the patient and medical records.       Past Medical History:  Diagnosis Date  . Diabetes mellitus without complication ([place])   . ESRD (end stage renal disease) ([place])   . GERD (gastroesophageal reflux disease)    PMH  . Hypertension   . Low back pain   . Metabolic acidosis   . Neuromuscular disorder ([place])    peripheral neuropathy  . Pancreatitis   . Schizophrenia ([place])    does not take medications    Patient Active Problem List   Diagnosis Date Noted  . Abdominal pain with vomiting 10/20/2019  . Hyperkalemia   . Acute pancreatitis 07/26/2019  . Uremia 02/24/2018  . History of anemia due to CKD 02/24/2018  . Marijuana abuse 02/24/2018  . ESRD (end stage renal disease) ([place])   . Hypertension   . Schizophrenia ([place])   . Metabolic acidosis 63/14/9702  . Hypertensive urgency 01/15/2018  . Type 2 diabetes mellitus with stage 4 chronic kidney disease ([place]) 01/15/2018  . Acute encephalopathy 02/15/2015  . CKD (chronic kidney disease) 02/14/2015  . Acute kidney injury superimposed on chronic  kidney disease ([place]) 02/14/2015  . Hyponatremia 02/14/2015  . Back pain 02/14/2015  . Neuropathy 02/14/2015  . Obesity 02/14/2015  . Abdominal pain 02/14/2015  . Pancreatitis   . Diabetes mellitus without complication ([place])   . Pancreatitis, acute   . Schizo-affective psychosis ([place]) 04/28/2013    Past Surgical History:  Procedure Laterality Date  . A/V FISTULAGRAM Left 06/03/2018   Procedure: A/V FISTULAGRAM;  Surgeon: Marty Heck, MD;  Location: [facility];  Service: Cardiovascular;  Laterality: Left;  . AV FISTULA PLACEMENT Left 02/11/2018   Procedure: INSERTION OF ARTERIOVENOUS (AV) FISTULA LEFT  ARM;  Surgeon: [name], MD;  Location: [facility];  Service: Vascular;  Laterality: Left;  . BASCILIC VEIN TRANSPOSITION Left 04/17/2018   Procedure: BASILIC VEIN TRANSPOSITION SECOND STAGE;  Surgeon: [name], MD;  Location: [facility];  Service: Vascular;  Laterality: Left;  . COLONOSCOPY    . DENTAL SURGERY    . INSERTION OF DIALYSIS CATHETER Right 02/25/2018   Procedure: INSERTION OF DIALYSIS CATHETER;  Surgeon: [name], MD;  Location: [facility];  Service: Vascular;  Laterality: Right;  . PERIPHERAL VASCULAR BALLOON ANGIOPLASTY  06/03/2018   Procedure: PERIPHERAL VASCULAR BALLOON ANGIOPLASTY;  Surgeon: Marty Heck, MD;  Location: [facility];  Service: Cardiovascular;;  left a/v fistula       Family History  Problem Relation Age  of Onset  . Kidney failure Mother     Social History   Tobacco Use  . Smoking status: Former Research scientist (life sciences)  . Smokeless tobacco: Never Used  Vaping Use  . Vaping Use: Never used  Substance Use Topics  . Alcohol use: No  . Drug use: Not Currently    Types: Cocaine, Marijuana    Comment: smoked marijuana a few days ago; he denies using cocaine    Home Medications Prior to Admission medications   Medication Sig Start Date End Date Taking? Authorizing Provider  acetaminophen (TYLENOL)  650 MG CR tablet Take 1,300 mg by mouth 2 (two) times daily as needed for pain.    [provider]  amLODipine (NORVASC) 10 MG tablet Take 10 mg by mouth at bedtime.  09/24/17   [provider]  calcitRIOL (ROCALTROL) 0.25 MCG capsule Take 1 capsule (0.25 mcg total) by mouth every Monday, Wednesday, and Friday with hemodialysis. Patient not taking: Reported on 09/15/2019 03/02/18   Lavina Hamman, MD  gabapentin (NEURONTIN) 300 MG capsule Take 300 mg by mouth See admin instructions. Take one capsule (300 mg) by mouth on Tuesday, Thursday, Saturday after dialysis Patient not taking: Reported on 09/15/2019 05/25/18   [provider]  Multiple Vitamins-Minerals (MULTIVITAMIN) LIQD Take 32 oz by mouth daily.    [provider]  pantoprazole (PROTONIX) 20 MG tablet Take 1 tablet (20 mg total) by mouth daily. 09/15/19   Varney Biles, MD  promethazine (PHENERGAN) 25 MG suppository Place 1 suppository (25 mg total) rectally every 8 (eight) hours as needed for nausea or vomiting. 09/15/19   Varney Biles, MD  sucralfate (CARAFATE) 1 g tablet Take 1 tablet (1 g total) by mouth 4 (four) times daily -  with meals and at bedtime. 09/15/19   Varney Biles, MD    Allergies    Ibuprofen and Penicillins  Review of Systems   Review of Systems  Constitutional: Negative for chills and fever.  HENT: Negative for sore throat.   Respiratory: Positive for shortness of breath.   Cardiovascular: Negative for chest pain.  Gastrointestinal: Positive for abdominal pain, nausea and vomiting. Negative for blood in stool and diarrhea.  Genitourinary: Negative for flank pain.  Musculoskeletal: Negative for back pain.  Skin: Negative for pallor and wound.  Neurological: Negative for light-headedness and headaches.  All other systems reviewed and are negative.   Physical Exam Updated Vital Signs BP (!) 146/83 (BP Location: Right Arm)   Pulse 92   Temp 98.5 F (36.9 C) (Oral)   Resp  16   Ht 5\' 9"  (1.753 m)   Wt (!) 92.1 kg   SpO2 99%   BMI 29.98 kg/m   Physical Exam Vitals and nursing note reviewed.  Constitutional:      Appearance: Normal appearance. He is not ill-appearing.  HENT:     Head: Normocephalic and atraumatic.     Nose: Nose normal.     Mouth/Throat:     Mouth: Mucous membranes are moist.  Cardiovascular:     Rate and Rhythm: Normal rate.  Pulmonary:     Effort: Pulmonary effort is normal.     Breath sounds: No wheezing or rales.  Abdominal:     General: Abdomen is flat. Bowel sounds are normal.     Palpations: Abdomen is soft.     Tenderness: There is no abdominal tenderness. There is no right CVA tenderness, left CVA tenderness or guarding.     Comments: Bowel sounds are present throughout,  minimal ttp.   Musculoskeletal:     Cervical back: Normal range of motion and neck supple.  Skin:    General: Skin is warm and dry.  Neurological:     Mental Status: He is oriented to person, place, and time.     ED Results / Procedures / Treatments   Labs (all labs ordered are listed, but only abnormal results are displayed) Labs Reviewed  COMPREHENSIVE METABOLIC PANEL - Abnormal; Notable for the following components:      Result Value   Sodium 134 (*)    Potassium 3.2 (*)    Chloride 93 (*)    BUN 26 (*)    Creatinine, Ser 9.72 (*)    Calcium 8.7 (*)    AST 11 (*)    GFR calc non Af Amer 5 (*)    GFR calc Af Amer 6 (*)    All other components within normal limits  CBC - Abnormal; Notable for the following components:   RBC 2.96 (*)    Hemoglobin 8.8 (*)    HCT 26.9 (*)    All other components within normal limits  SARS CORONAVIRUS 2 BY RT PCR (HOSPITAL ORDER, [facility])  LIPASE, BLOOD  TYPE AND SCREEN    EKG None  Radiology No results found.  Procedures Procedures (including critical care time)  Medications Ordered in ED Medications  sodium chloride flush (NS) 0.9 % injection 3 mL (has no  administration in time range)    ED Course  I have reviewed the triage vital signs and the nursing notes.  Pertinent labs & imaging results that were available during my care of the patient were reviewed by me and considered in my medical decision making (see chart for details).    MDM Rules/Calculators/A&P   Patient with a past medical history of ESRD currently on dialysis, last treatment was today presents to the ED sent in by gastroenterology for admission and possible EGD tomorrow morning.  Patient was sent in by Dr. Janeece Riggers, patient reports nausea, vomiting, unintentional weight loss complaining of weakness and pain which is 9 out of 10.  12:10 AM Type and screen was ordered for patient, a consultation was placed to [facility] in order to confirm EGD occurring tomorrow.Patient appears in stable condition.   12:14 AM Spoke to Dr. Delma Officer, who reports patient is being admitted for EGD. Negative CT exams, ESRD patient with recurrent symptoms and weight loss, to be scoped by [facility] at 10 am tomorrow.   Interpretation of labs by me showed no leukocytosis, hemoglobin is 8.8 which is decrease from his baseline he does report some prior "streaks of blood" with his emesis. CMP with mild hypokalemia, Creatine 9.72 after completing treatment today. Patient is to be kept NPO after midnight until procedure this morning.   12:54 AM Spoke to Dr. Myna Hidalgo who will admit patient for further management and admission.    Portions of this note were generated with [employer]. Dictation errors may occur despite best attempts at proofreading.  Final Clinical Impression(s) / ED Diagnoses Final diagnoses:  Generalized abdominal pain    Rx / DC Orders ED Discharge Orders    None       Janeece Fitting, PA-C 10/20/19 0054    Merryl Hacker, MD 10/20/19 [id number]

## 2019-10-20 ENCOUNTER — Observation Stay (HOSPITAL_COMMUNITY): Payer: Medicare Other | Admitting: Anesthesiology

## 2019-10-20 ENCOUNTER — Encounter (HOSPITAL_COMMUNITY)
Admission: EM | Disposition: A | Discharge status: Home / Self Care | Payer: Self-pay | Source: Home / Self Care | Attending: Emergency Medicine

## 2019-10-20 ENCOUNTER — Encounter (HOSPITAL_COMMUNITY): Payer: Self-pay | Admitting: Family Medicine

## 2019-10-20 DIAGNOSIS — E876 Hypokalemia: Secondary | ICD-10-CM | POA: Diagnosis present

## 2019-10-20 DIAGNOSIS — K859 Acute pancreatitis without necrosis or infection, unspecified: Secondary | ICD-10-CM | POA: Diagnosis not present

## 2019-10-20 DIAGNOSIS — R112 Nausea with vomiting, unspecified: Secondary | ICD-10-CM | POA: Diagnosis not present

## 2019-10-20 DIAGNOSIS — N186 End stage renal disease: Secondary | ICD-10-CM

## 2019-10-20 DIAGNOSIS — R109 Unspecified abdominal pain: Secondary | ICD-10-CM

## 2019-10-20 DIAGNOSIS — R1013 Epigastric pain: Secondary | ICD-10-CM | POA: Diagnosis not present

## 2019-10-20 DIAGNOSIS — D509 Iron deficiency anemia, unspecified: Secondary | ICD-10-CM | POA: Diagnosis not present

## 2019-10-20 DIAGNOSIS — K317 Polyp of stomach and duodenum: Secondary | ICD-10-CM | POA: Diagnosis not present

## 2019-10-20 DIAGNOSIS — I10 Essential (primary) hypertension: Secondary | ICD-10-CM

## 2019-10-20 DIAGNOSIS — I120 Hypertensive chronic kidney disease with stage 5 chronic kidney disease or end stage renal disease: Secondary | ICD-10-CM | POA: Diagnosis not present

## 2019-10-20 DIAGNOSIS — D649 Anemia, unspecified: Secondary | ICD-10-CM

## 2019-10-20 DIAGNOSIS — R1110 Vomiting, unspecified: Secondary | ICD-10-CM | POA: Diagnosis present

## 2019-10-20 DIAGNOSIS — Z20822 Contact with and (suspected) exposure to covid-19: Secondary | ICD-10-CM | POA: Diagnosis not present

## 2019-10-20 DIAGNOSIS — K219 Gastro-esophageal reflux disease without esophagitis: Secondary | ICD-10-CM | POA: Diagnosis not present

## 2019-10-20 HISTORY — PX: ESOPHAGOGASTRODUODENOSCOPY (EGD) WITH PROPOFOL: SHX5813

## 2019-10-20 HISTORY — PX: BIOPSY: SHX5522

## 2019-10-20 LAB — TYPE AND SCREEN
ABO/RH(D): O POS
Antibody Screen: NEGATIVE

## 2019-10-20 LAB — ABO/RH: ABO/RH(D): O POS

## 2019-10-20 LAB — CBG MONITORING, ED: Glucose-Capillary: 104 mg/dL — ABNORMAL HIGH (ref 70–99)

## 2019-10-20 LAB — SARS CORONAVIRUS 2 BY RT PCR (HOSPITAL ORDER, PERFORMED IN ~~LOC~~ HOSPITAL LAB): SARS Coronavirus 2: NEGATIVE

## 2019-10-20 SURGERY — ESOPHAGOGASTRODUODENOSCOPY (EGD) WITH PROPOFOL
Anesthesia: Monitor Anesthesia Care

## 2019-10-20 MED ORDER — SODIUM CHLORIDE 0.9% FLUSH: 3.0000 mL | INTRAVENOUS | Status: DC | PRN

## 2019-10-20 MED ORDER — SODIUM CHLORIDE 0.9 % IV SOLN: INTRAVENOUS | Status: DC

## 2019-10-20 MED ORDER — FENTANYL CITRATE (PF) 100 MCG/2ML IJ SOLN
25.0000 ug | INTRAMUSCULAR | Status: DC | PRN
  Administered 2019-10-20: 25 ug via INTRAVENOUS
  Administered 2019-10-20: 50 ug via INTRAVENOUS
  Filled 2019-10-20 (×3): qty 2

## 2019-10-20 MED ORDER — ACETAMINOPHEN 650 MG RE SUPP: 650.0000 mg | Freq: Four times a day (QID) | RECTAL | Status: DC | PRN

## 2019-10-20 MED ORDER — ACETAMINOPHEN 325 MG PO TABS: 650.0000 mg | ORAL_TABLET | Freq: Four times a day (QID) | ORAL | Status: DC | PRN

## 2019-10-20 MED ORDER — SODIUM CHLORIDE 0.9 % IV SOLN: 250.0000 mL | INTRAVENOUS | Status: DC | PRN

## 2019-10-20 MED ORDER — AMLODIPINE BESYLATE 5 MG PO TABS: 10.0000 mg | ORAL_TABLET | Freq: Every day | ORAL | Status: DC

## 2019-10-20 MED ORDER — PROPOFOL 10 MG/ML IV BOLUS
INTRAVENOUS | Status: DC | PRN
  Administered 2019-10-20: 25 mg via INTRAVENOUS

## 2019-10-20 MED ORDER — SODIUM CHLORIDE 0.9% FLUSH: 3.0000 mL | Freq: Two times a day (BID) | INTRAVENOUS | Status: DC

## 2019-10-20 MED ORDER — PANTOPRAZOLE SODIUM 40 MG IV SOLR: 40.0000 mg | INTRAVENOUS | Status: DC

## 2019-10-20 MED ORDER — PROPOFOL 500 MG/50ML IV EMUL
INTRAVENOUS | Status: DC | PRN
  Administered 2019-10-20: 150 ug/kg/min via INTRAVENOUS

## 2019-10-20 MED ORDER — LIDOCAINE 2% (20 MG/ML) 5 ML SYRINGE
INTRAMUSCULAR | Status: DC | PRN
Start: 2019-10-20 — End: 2019-10-20
  Administered 2019-10-20: 40 mg via INTRAVENOUS

## 2019-10-20 MED ORDER — POTASSIUM CHLORIDE 10 MEQ/100ML IV SOLN: 10.0000 meq | Freq: Once | INTRAVENOUS | Status: DC

## 2019-10-20 MED ORDER — ONDANSETRON HCL 4 MG/2ML IJ SOLN
4.0000 mg | Freq: Four times a day (QID) | INTRAMUSCULAR | Status: DC | PRN
  Administered 2019-10-20: 4 mg via INTRAVENOUS
  Filled 2019-10-20: qty 2

## 2019-10-20 SURGICAL SUPPLY — 15 items

## 2019-10-20 NOTE — Anesthesia Preprocedure Evaluation (Addendum)
Anesthesia Evaluation  Patient identified by MRN, date of birth, ID band Patient awake    Reviewed: Allergy & Precautions, H&P , NPO status , Patient's Chart, lab work & pertinent test results  Airway Mallampati: II  TM Distance: >3 FB Neck ROM: Full    Dental no notable dental hx. (+) Edentulous Upper, Edentulous Lower, Dental Advisory Given   Pulmonary neg pulmonary ROS, former smoker,    Pulmonary exam normal breath sounds clear to auscultation       Cardiovascular hypertension, Pt. on medications  Rhythm:Regular Rate:Normal     Neuro/Psych Schizophrenia negative neurological ROS     GI/Hepatic Neg liver ROS, GERD  Medicated,  Endo/Other  diabetes  Renal/GU ESRFRenal disease  negative genitourinary   Musculoskeletal   Abdominal   Peds  Hematology  (+) Blood dyscrasia, anemia ,   Anesthesia Other Findings   Reproductive/Obstetrics negative OB ROS                            Anesthesia Physical Anesthesia Plan  ASA: III  Anesthesia Plan: MAC   Post-op Pain Management:    Induction: Intravenous  PONV Risk Score and Plan: 1 and Propofol infusion  Airway Management Planned: Nasal Cannula  Additional Equipment:   Intra-op Plan:   Post-operative Plan:   Informed Consent: I have reviewed the patients History and Physical, chart, labs and discussed the procedure including the risks, benefits and alternatives for the proposed anesthesia with the patient or authorized representative who has indicated his/her understanding and acceptance.     Dental advisory given  Plan Discussed with: CRNA  Anesthesia Plan Comments:         Anesthesia Quick Evaluation

## 2019-10-20 NOTE — ED Notes (Signed)
Pt provided warm blankets, pt resting comfortably with eyes closed denies any, wants, needs or concerns at this time.

## 2019-10-20 NOTE — Progress Notes (Signed)
Jonathan Mcneil 10:52 AM  Subjective: Patient is currently doing well not any specific complaints in his hospital computer chart reviewed his case discussed with my partner Dr. B and we rediscussed his endoscopy and his negative work-up to date  Objective: Vital signs stable afebrile exam please see preassessment evaluation labs and x-rays reviewed  Assessment: Domino pain nausea vomiting questionable etiology  Plan: Okay to proceed with endoscopy with anesthesia assistance  [facility]  office [phone] After 5PM or if no answer call [phone]

## 2019-10-20 NOTE — Progress Notes (Signed)
Please see Endo note for details but patient can be discharged from our standpoint and set up a outpatient gastric emptying study on a nondialysis day or if you decide to keep him in the hospital for other reasons please schedule in a.m. and he can follow-up with Dr. Cristina Gong in 2 weeks after above study has been completed

## 2019-10-20 NOTE — Transfer of Care (Signed)
Immediate Anesthesia Transfer of Care Note  Patient: Jonathan Mcneil  Procedure(s) Performed: ESOPHAGOGASTRODUODENOSCOPY (EGD) WITH PROPOFOL (N/A ) BIOPSY  Patient Location: PACU  Anesthesia Type:MAC  Level of Consciousness: awake, drowsy and patient cooperative  Airway & Oxygen Therapy: Patient Spontanous Breathing and Patient connected to nasal cannula oxygen  Post-op Assessment: Report given to RN and Post -op Vital signs reviewed and stable  Post vital signs: Reviewed and stable  Last Vitals:  Vitals Value Taken Time  BP 115/60 10/20/19 1110  Temp    Pulse 80 10/20/19 1111  Resp 27 10/20/19 1111  SpO2 98 % 10/20/19 1111  Vitals shown include unvalidated device data.  Last Pain:  Vitals:   10/20/19 1014  TempSrc: Oral  PainSc: 0-No pain         Complications: No complications documented.

## 2019-10-20 NOTE — Anesthesia Postprocedure Evaluation (Signed)
Anesthesia Post Note  Patient: [name]  Procedure(s) Performed: ESOPHAGOGASTRODUODENOSCOPY (EGD) WITH PROPOFOL (N/A ) BIOPSY     Patient location during evaluation: Endoscopy Anesthesia Type: MAC Level of consciousness: awake and alert Pain management: pain level controlled Vital Signs Assessment: post-procedure vital signs reviewed and stable Respiratory status: spontaneous breathing, nonlabored ventilation and respiratory function stable Cardiovascular status: stable and blood pressure returned to baseline Postop Assessment: no apparent nausea or vomiting Anesthetic complications: no   No complications documented.  Last Vitals:  Vitals:   10/20/19 1123 10/20/19 1214  BP: 127/70 (!) 143/86  Pulse: 75 78  Resp: 22 15  Temp:  36.5 C  SpO2: 94% 96%    Last Pain:  Vitals:   10/20/19 1214  TempSrc: Oral  PainSc:                  Wava Kildow,W. EDMOND

## 2019-10-20 NOTE — Discharge Summary (Signed)
Physician Discharge Summary  Jonathan Mcneil ZHY:[id number] DOB: [birth year] DOA: 10/19/2019  PCP: Merrilee Seashore, MD  Admit date: 10/19/2019 Discharge date: 10/20/2019  Admitted From: Home  Discharge disposition: home   Recommendations for Outpatient Follow-Up:   . Follow up with your primary care provider as scheduled by you .  Return to GI clinic in 2 weeks. . Telephone GI clinic for pathology results in 1 week. Plan is to do a gastric emptying study at appointment . Continue hemodialysis as scheduled  Discharge Diagnosis:   Principal Problem:   Abdominal pain with vomiting Active Problems:   ESRD (end stage renal disease) (HCC)   Hypertension   Hypokalemia   Normocytic anemia   Discharge Condition: Improved.  Diet recommendation: Low sodium, heart healthy  Wound care: None.  Code status: Full.   History of Present Illness:   Jonathan Mcneil is a 64 y.o. male with medical history significant for type 2 diabetes mellitus, hypertension, and end-stage renal disease on hemodialysis, presented to the emergency department at the direction of his gastroenterologist for abdominal pain with nausea, vomiting, and weight loss.  Patient reported that he developed intermittent abdominal pain almost 2 months, with nausea and has been vomiting.   He completed dialysis yesterday but was feeling generally weak after this, was seen by his gastroenterologist for outpatient EGD, but it was felt that this should be performed in the hospital given the patient's weakness. ED Course: Upon arrival to the ED, patient is found to be afebrile, saturating well on room air, and with stable blood pressure.  Chemistry panel notable for potassium of 3.2, normal bicarbonate, and BUN 26.  CBC with hemoglobin 8.8, down from 11.2 last month.  Gastroenterology was consulted by the ED physician.  Hospital Course:   Following conditions were addressed during hospitalization as listed  below,  Abdominal pain, N/V  Patient presented with several weeks of abdominal pain, N/V, and unintentional wt-loss, reports having negative CT scan with his PCP, and saw GI for outpatient endoscopy but appeared to weak and was directed to the hospital. Status post EGD with normal findings.  GI has recommended outpatient follow-up after discharge.    ESRD  Had HD on 7/27.  Will resume outpatient hemodialysis on discharge.   Hypokalemia  Mild.  Serum potassium is 3.2 .  Adjust with hemodialysis .  Anemia  - Hgb is 8.8 in ED, down from 11.2 one month ago.  GI actively following as outpatient.    Diet-controlled DM  - A1c was 6.6% in 2019, serum glucose 83 in ED, no longer on diabetes medications   Disposition.  At this time, patient is stable for disposition home. Patient was seen after endoscopy and GI has cleared the patient for discharge home.  Medical Consultants:    GI  Procedures:    Upper GI endoscopy on 10/20/2019 Subjective:   Today, patient was seen after endoscopy.  Denies any nausea, vomiting.  Discharge Exam:   Vitals:   10/20/19 1123 10/20/19 1214  BP: 127/70 (!) 143/86  Pulse: 75 78  Resp: 22 15  Temp:  97.7 F (36.5 C)  SpO2: 94% 96%   Vitals:   10/20/19 1110 10/20/19 1120 10/20/19 1123 10/20/19 1214  BP: (!) 115/60 127/70 127/70 (!) 143/86  Pulse: 84 73 75 78  Resp: (!) 24 21 22 15   Temp: 98.6 F (37 C)   97.7 F (36.5 C)  TempSrc: Axillary   Oral  SpO2: 100% 95% 94% 96%  Weight:  Height:        General: Alert awake, not in obvious distress HENT: pupils equally reacting to light,  No scleral pallor or icterus noted. Oral mucosa is moist.  Chest:  Clear breath sounds.  Diminished breath sounds bilaterally. No crackles or wheezes.  CVS: S1 &S2 heard. No murmur.  Regular rate and rhythm. Abdomen: Soft, nontender, nondistended.  Bowel sounds are heard.   Extremities: No cyanosis, clubbing or edema.  Peripheral pulses are palpable. Psych:  Alert, awake and oriented, normal mood CNS:  No cranial nerve deficits.  Power equal in all extremities.   Skin: Warm and dry.  No rashes noted.  The results of significant diagnostics from this hospitalization (including imaging, microbiology, ancillary and laboratory) are listed below for reference.     Diagnostic Studies:   No results found.   Labs:   Basic Metabolic Panel: Recent Labs  Lab 10/19/19 1633  NA 134*  K 3.2*  CL 93*  CO2 30  GLUCOSE 83  BUN 26*  CREATININE 9.72*  CALCIUM 8.7*   GFR Estimated Creatinine Clearance: 8.7 mL/min (A) (by C-G formula based on SCr of 9.72 mg/dL (H)). Liver Function Tests: Recent Labs  Lab 10/19/19 1633  AST 11*  ALT 11  ALKPHOS 58  BILITOT 1.0  PROT 8.0  ALBUMIN 3.9   Recent Labs  Lab 10/19/19 1633  LIPASE 23   No results for input(s): AMMONIA in the last 168 hours. Coagulation profile No results for input(s): INR, PROTIME in the last 168 hours.  CBC: Recent Labs  Lab 10/19/19 1633  WBC 5.1  HGB 8.8*  HCT 26.9*  MCV 90.9  PLT 224   Cardiac Enzymes: No results for input(s): CKTOTAL, CKMB, CKMBINDEX, TROPONINI in the last 168 hours. BNP: Invalid input(s): POCBNP CBG: No results for input(s): GLUCAP in the last 168 hours. D-Dimer No results for input(s): DDIMER in the last 72 hours. Hgb A1c No results for input(s): HGBA1C in the last 72 hours. Lipid Profile No results for input(s): CHOL, HDL, LDLCALC, TRIG, CHOLHDL, LDLDIRECT in the last 72 hours. Thyroid function studies No results for input(s): TSH, T4TOTAL, T3FREE, THYROIDAB in the last 72 hours.  Invalid input(s): FREET3 Anemia work up No results for input(s): VITAMINB12, FOLATE, FERRITIN, TIBC, IRON, RETICCTPCT in the last 72 hours. Microbiology Recent Results (from the past 240 hour(s))  SARS Coronavirus 2 by RT PCR (hospital order, performed in [facility] hospital lab) Nasopharyngeal Nasopharyngeal Swab     Status: None   Collection Time:  10/20/19  1:26 AM   Specimen: Nasopharyngeal Swab  Result Value Ref Range Status   SARS Coronavirus 2 NEGATIVE NEGATIVE Final    Comment: (NOTE) SARS-CoV-2 target nucleic acids are NOT DETECTED.  The SARS-CoV-2 RNA is generally detectable in upper and lower respiratory specimens during the acute phase of infection. The lowest concentration of SARS-CoV-2 viral copies this assay can detect is 250 copies / mL. A negative result does not preclude SARS-CoV-2 infection and should not be used as the sole basis for treatment or other patient management decisions.  A negative result may occur with improper specimen collection / handling, submission of specimen other than nasopharyngeal swab, presence of viral mutation(s) within the areas targeted by this assay, and inadequate number of viral copies (<250 copies / mL). A negative result must be combined with clinical observations, patient history, and epidemiological information.  Fact Sheet for Patients:   StrictlyIdeas.no  Fact Sheet for Healthcare Providers: BankingDealers.co.za  This test is not  yet approved or  cleared by the [place] and has been authorized for detection and/or diagnosis of SARS-CoV-2 by FDA under an Emergency Use Authorization (EUA).  This EUA will remain in effect (meaning this test can be used) for the duration of the COVID-19 declaration under Section 564(b)(1) of the Act, 21 U.S.C. section 360bbb-3(b)(1), unless the authorization is terminated or revoked sooner.  Performed at [facility], [place] [address], [place], [place] [address]      Discharge Instructions:   Discharge Instructions     Diet - low sodium heart healthy   Complete by: As directed    Discharge instructions   Complete by: As directed    Soft diet today.Continue present medications.  Return to GI clinic in 2 weeks. Telephone GI clinic if symptomatic as needed.   Increase  activity slowly   Complete by: As directed       Allergies as of 10/20/2019       Reactions   Ibuprofen Other (See Comments)   Unknown reaction   Penicillins Itching, Other (See Comments)   Has patient had a PCN reaction causing immediate rash, facial/tongue/throat swelling, SOB or lightheadedness with hypotension: No Has patient had a PCN reaction causing severe rash involving mucus membranes or skin necrosis: No Has patient had a PCN reaction that required hospitalization No Has patient had a PCN reaction occurring within the last 10 years: Unknown If all of the above answers are "NO", then may proceed with Cephalosporin use.        Medication List     TAKE these medications    acetaminophen 650 MG CR tablet Commonly known as: TYLENOL Take 1,300 mg by mouth 2 (two) times daily as needed for pain.   amLODipine 10 MG tablet Commonly known as: NORVASC Take 10 mg by mouth at bedtime.   calcitRIOL 0.25 MCG capsule Commonly known as: ROCALTROL Take 1 capsule (0.25 mcg total) by mouth every Monday, Wednesday, and Friday with hemodialysis.   Multivitamin Liqd Take 32 oz by mouth daily.   pantoprazole 20 MG tablet Commonly known as: PROTONIX Take 1 tablet (20 mg total) by mouth daily.   pantoprazole 40 MG tablet Commonly known as: PROTONIX Take 40 mg by mouth daily.   promethazine 25 MG suppository Commonly known as: PHENERGAN Place 1 suppository (25 mg total) rectally every 8 (eight) hours as needed for nausea or vomiting.   promethazine 25 MG tablet Commonly known as: PHENERGAN Take 25 mg by mouth 3 (three) times daily.   sucralfate 1 g tablet Commonly known as: Carafate Take 1 tablet (1 g total) by mouth 4 (four) times daily -  with meals and at bedtime.   Velphoro 500 MG chewable tablet Generic drug: sucroferric oxyhydroxide Chew 500 mg by mouth 3 (three) times daily.           Time coordinating discharge: 39 minutes  Signed:  Tahani Potier  [facility] 10/20/2019, 3:00 PM

## 2019-10-20 NOTE — Op Note (Signed)
[facility] Patient Name: Jonathan Mcneil Procedure Date : 10/20/2019 MRN: [id number] Attending MD: Clarene Essex , MD Date of Birth: 04/11/1955 CSN: [id number] Age: 64 Admit Type: Inpatient Procedure:                Upper GI endoscopy Indications:              Epigastric abdominal pain, Unexplained iron                            deficiency anemia, Nausea with vomiting Providers:                Clarene Essex, MD, Glori Bickers, RN, Laverda Sorenson,                            Technician, Luane School, CRNA Referring MD:              Medicines:                Propofol total dose 706 mg IV Complications:            No immediate complications. Estimated Blood Loss:     Estimated blood loss: none. Procedure:                Pre-Anesthesia Assessment:                           - Prior to the procedure, a History and Physical                            was performed, and patient medications and                            allergies were reviewed. The patient's tolerance of                            previous anesthesia was also reviewed. The risks                            and benefits of the procedure and the sedation                            options and risks were discussed with the patient.                            All questions were answered, and informed consent                            was obtained. Prior Anticoagulants: The patient has                            taken no previous anticoagulant or antiplatelet                            agents. ASA Grade Assessment: III - A patient with  severe systemic disease. After reviewing the risks                            and benefits, the patient was deemed in                            satisfactory condition to undergo the procedure.                           After obtaining informed consent, the endoscope was                            passed under direct vision. Throughout the                             procedure, the patient's blood pressure, pulse, and                            oxygen saturations were monitored continuously. The                            GIF-H190 ([id number] Olympus gastroscope was                            introduced through the mouth, and advanced to the                            third part of duodenum. The upper GI endoscopy was                            accomplished without difficulty. The patient                            tolerated the procedure well. Scope In: Scope Out: Findings:      The larynx was normal.      The examined esophagus was normal.      The entire examined stomach was normal.      The duodenal bulb and first portion of the duodenum were normal.      Two diminutive semi-sessile polyps with no bleeding were found in the       second portion of the duodenum and in the third portion of the duodenum.       Biopsies were taken with a cold forceps for histology.      The exam was otherwise without abnormality. Impression:               - Normal larynx.                           - Normal esophagus.                           - Normal stomach.                           - Normal duodenal bulb and first portion of the  duodenum.                           - Two duodenal polyps. Biopsied.                           - The examination was otherwise normal. Recommendation:           - Soft diet today with his customary renal                            restrictions and okay with me to go home with                            outpatient work-up and follow-up.                           - Continue present medications.                           - Await pathology results.                           - Return to GI clinic in 2 weeks.                           - Telephone GI clinic for pathology results in 1                            week.                           - Telephone GI clinic if symptomatic PRN.                           - Do a  gastric emptying study at appointment to be                            scheduled unless admitted overnight for other                            reasons then please schedule for a.m. and make                            n.p.o. after midnight for that test. Procedure Code(s):        --- Professional ---                           [phone], Esophagogastroduodenoscopy, flexible,                            transoral; with biopsy, single or multiple Diagnosis Code(s):        --- Professional ---                           K31.7, Polyp of stomach and duodenum  R10.13, Epigastric pain                           D50.9, Iron deficiency anemia, unspecified                           R11.2, Nausea with vomiting, unspecified CPT copyright 2019 American [employer]. All rights reserved. The codes documented in this report are preliminary and upon coder review may  be revised to meet current compliance requirements. Clarene Essex, MD 10/20/2019 11:20:11 AM This report has been signed electronically. Number of Addenda: 0

## 2019-10-20 NOTE — ED Notes (Signed)
Pt resting comfortably in bed with eyes closed, no acute distress noted at this time.

## 2019-10-20 NOTE — ED Notes (Signed)
Was told in report that the pt was waiting for  Discharge papers  None have shown u[p yet

## 2019-10-20 NOTE — Anesthesia Procedure Notes (Signed)
Procedure Name: MAC Date/Time: 10/20/2019 10:58 AM Performed by: Renato Shin, CRNA Pre-anesthesia Checklist: Patient identified, Emergency Drugs available, Suction available and Patient being monitored Patient Re-evaluated:Patient Re-evaluated prior to induction Oxygen Delivery Method: Nasal cannula Preoxygenation: Pre-oxygenation with 100% oxygen Induction Type: IV induction Placement Confirmation: positive ETCO2 and breath sounds checked- equal and bilateral Dental Injury: Teeth and Oropharynx as per pre-operative assessment

## 2019-10-20 NOTE — H&P (Signed)
History and Physical    Jonathan Mcneil GBT:[id number] DOB: Aug 15, 1955 DOA: 10/19/2019  PCP: Merrilee Seashore, MD   Patient coming from: Home   Chief Complaint: Abdominal pain, N/V, weight loss   HPI: Jonathan Mcneil is a 64 y.o. male with medical history significant for type 2 diabetes mellitus, hypertension, and end-stage renal disease on hemodialysis, now presenting to the emergency department at the direction of his gastroenterologist for abdominal pain with nausea, vomiting, and weight loss.  Patient reports that he developed intermittent abdominal pain almost 2 months ago now, continues to have intermittent pain that he describes as generalized, continues to have nausea and has been vomiting.  He denies any fevers, diarrhea, melena, or hematochezia, but reports occasional streak of red blood in his vomitus.  He reports losing close to 20 pounds in the past couple weeks.  He completed dialysis yesterday but was feeling generally weak after this, was seen by his gastroenterologist for outpatient EGD, but it was felt that this should be performed in the hospital given the patient's weakness.  ED Course: Upon arrival to the ED, patient is found to be afebrile, saturating well on room air, and with stable blood pressure.  Chemistry panel notable for potassium of 3.2, normal bicarbonate, and BUN 26.  CBC with hemoglobin 8.8, down from 11.2 last month.  Gastroenterology was consulted by the ED physician and indicates that they plan to do EGD in the hospital in the morning.  COVID-19 screening test not yet resulted.  Review of Systems:  All other systems reviewed and apart from HPI, are negative.  Past Medical History:  Diagnosis Date  . Diabetes mellitus without complication ([place])   . ESRD (end stage renal disease) ([place])   . GERD (gastroesophageal reflux disease)    PMH  . Hypertension   . Low back pain   . Metabolic acidosis   . Neuromuscular disorder ([place])    peripheral  neuropathy  . Pancreatitis   . Schizophrenia ([place])    does not take medications    Past Surgical History:  Procedure Laterality Date  . A/V FISTULAGRAM Left 06/03/2018   Procedure: A/V FISTULAGRAM;  Surgeon: Marty Heck, MD;  Location: [facility];  Service: Cardiovascular;  Laterality: Left;  . AV FISTULA PLACEMENT Left 02/11/2018   Procedure: INSERTION OF ARTERIOVENOUS (AV) FISTULA LEFT  ARM;  Surgeon: [name], MD;  Location: [facility];  Service: Vascular;  Laterality: Left;  . BASCILIC VEIN TRANSPOSITION Left 04/17/2018   Procedure: BASILIC VEIN TRANSPOSITION SECOND STAGE;  Surgeon: [name], MD;  Location: [facility];  Service: Vascular;  Laterality: Left;  . COLONOSCOPY    . DENTAL SURGERY    . INSERTION OF DIALYSIS CATHETER Right 02/25/2018   Procedure: INSERTION OF DIALYSIS CATHETER;  Surgeon: [name], MD;  Location: [facility];  Service: Vascular;  Laterality: Right;  . PERIPHERAL VASCULAR BALLOON ANGIOPLASTY  06/03/2018   Procedure: PERIPHERAL VASCULAR BALLOON ANGIOPLASTY;  Surgeon: Marty Heck, MD;  Location: [facility];  Service: Cardiovascular;;  left a/v fistula    Social History:   reports that he has quit smoking. He has never used smokeless tobacco. He reports previous drug use. Drugs: Cocaine and Marijuana. He reports that he does not drink alcohol.  Allergies  Allergen Reactions  . Ibuprofen Other (See Comments)    Unknown reaction  . Penicillins Itching and Other (See Comments)    Has patient had a PCN reaction causing immediate rash,  facial/tongue/throat swelling, SOB or lightheadedness with hypotension: No Has patient had a PCN reaction causing severe rash involving mucus membranes or skin necrosis: No Has patient had a PCN reaction that required hospitalization No Has patient had a PCN reaction occurring within the last 10 years: Unknown If all of the above answers are "NO", then may proceed  with Cephalosporin use.    Family History  Problem Relation Age of Onset  . Kidney failure Mother      Prior to Admission medications   Medication Sig Start Date End Date Taking? Authorizing Provider  acetaminophen (TYLENOL) 650 MG CR tablet Take 1,300 mg by mouth 2 (two) times daily as needed for pain.   Yes [provider]  amLODipine (NORVASC) 10 MG tablet Take 10 mg by mouth at bedtime.  09/24/17  Yes [provider]  Multiple Vitamins-Minerals (MULTIVITAMIN) LIQD Take 32 oz by mouth daily.   Yes [provider]  pantoprazole (PROTONIX) 40 MG tablet Take 40 mg by mouth daily. 10/11/19  Yes [provider]  promethazine (PHENERGAN) 25 MG suppository Place 1 suppository (25 mg total) rectally every 8 (eight) hours as needed for nausea or vomiting. 09/15/19  Yes Nanavati, Ankit, MD  promethazine (PHENERGAN) 25 MG tablet Take 25 mg by mouth 3 (three) times daily. 10/11/19  Yes [provider]  sucralfate (CARAFATE) 1 g tablet Take 1 tablet (1 g total) by mouth 4 (four) times daily -  with meals and at bedtime. 09/15/19  Yes Varney Biles, MD  VELPHORO 500 MG chewable tablet Chew 500 mg by mouth 3 (three) times daily. 10/12/19  Yes [provider]  calcitRIOL (ROCALTROL) 0.25 MCG capsule Take 1 capsule (0.25 mcg total) by mouth every Monday, Wednesday, and Friday with hemodialysis. Patient not taking: Reported on 09/15/2019 03/02/18   Lavina Hamman, MD  pantoprazole (PROTONIX) 20 MG tablet Take 1 tablet (20 mg total) by mouth daily. Patient not taking: Reported on 10/20/2019 09/15/19   Varney Biles, MD    Physical Exam: Vitals:   10/19/19 1559 10/19/19 2013  BP: (!) 147/79 (!) 146/83  Pulse: 86 92  Resp: 18 16  Temp: 98.6 F (37 C) 98.5 F (36.9 C)  TempSrc: Oral Oral  SpO2: 100% 99%  Weight: (!) 92.1 kg   Height: 5\' 9"  (1.753 m)     Constitutional: NAD, calm  Eyes: PERTLA, lids and conjunctivae normal ENMT: Mucous membranes  are moist. Posterior pharynx clear of any exudate or lesions.   Neck: normal, supple, no masses, no thyromegaly Respiratory: no wheezing, no crackles. No accessory muscle use.  Cardiovascular: S1 & S2 heard, regular rate and rhythm. No extremity edema.   Abdomen: No distension, no tenderness, soft. Bowel sounds active.  Musculoskeletal: no clubbing / cyanosis. No joint deformity upper and lower extremities.   Skin: no significant rashes, lesions, ulcers. Warm, dry, well-perfused. Neurologic: CN 2-12 grossly intact. Sensation intact. Moving all extremities.  Psychiatric: Alert and oriented to person, place, and situation. Pleasant and cooperative.    Labs and Imaging on Admission: I have personally reviewed following labs and imaging studies  CBC: Recent Labs  Lab 10/19/19 1633  WBC 5.1  HGB 8.8*  HCT 26.9*  MCV 90.9  PLT 250   Basic Metabolic Panel: Recent Labs  Lab 10/19/19 1633  NA 134*  K 3.2*  CL 93*  CO2 30  GLUCOSE 83  BUN 26*  CREATININE 9.72*  CALCIUM 8.7*   GFR: Estimated Creatinine Clearance: 8.7 mL/min (A) (by  C-G formula based on SCr of 9.72 mg/dL (H)). Liver Function Tests: Recent Labs  Lab 10/19/19 1633  AST 11*  ALT 11  ALKPHOS 58  BILITOT 1.0  PROT 8.0  ALBUMIN 3.9   Recent Labs  Lab 10/19/19 1633  LIPASE 23   No results for input(s): AMMONIA in the last 168 hours. Coagulation Profile: No results for input(s): INR, PROTIME in the last 168 hours. Cardiac Enzymes: No results for input(s): CKTOTAL, CKMB, CKMBINDEX, TROPONINI in the last 168 hours. BNP (last 3 results) No results for input(s): PROBNP in the last 8760 hours. HbA1C: No results for input(s): HGBA1C in the last 72 hours. CBG: No results for input(s): GLUCAP in the last 168 hours. Lipid Profile: No results for input(s): CHOL, HDL, LDLCALC, TRIG, CHOLHDL, LDLDIRECT in the last 72 hours. Thyroid Function Tests: No results for input(s): TSH, T4TOTAL, FREET4, T3FREE, THYROIDAB in  the last 72 hours. Anemia Panel: No results for input(s): VITAMINB12, FOLATE, FERRITIN, TIBC, IRON, RETICCTPCT in the last 72 hours. Urine analysis:    Component Value Date/Time   COLORURINE STRAW (A) 09/15/2019 1305   APPEARANCEUR CLEAR 09/15/2019 1305   LABSPEC 1.011 09/15/2019 1305   PHURINE 7.0 09/15/2019 1305   GLUCOSEU 50 (A) 09/15/2019 1305   HGBUR SMALL (A) 09/15/2019 1305   BILIRUBINUR NEGATIVE 09/15/2019 1305   KETONESUR 5 (A) 09/15/2019 1305   PROTEINUR >=300 (A) 09/15/2019 1305   UROBILINOGEN 0.2 10/10/2014 1600   NITRITE NEGATIVE 09/15/2019 1305   LEUKOCYTESUR NEGATIVE 09/15/2019 1305   Sepsis Labs: @LABRCNTIP (procalcitonin:4,lacticidven:4) )No results found for this or any previous visit (from the past 240 hour(s)).   Radiological Exams on Admission: No results found.  Assessment/Plan   1. Abdominal pain, N/V  - Presents with several weeks of abdominal pain, N/V, and unintentional wt-loss, reports having negative CT scan with his PCP, and saw GI for outpatient endoscopy but appeared to weak and was directed to the hospital  - ED physician discussed with GI who is planning for EGD in am  - Keep NPO, continue PPI and antiemetics    2. ESRD  - Patient reports completing HD on 7/27  - There is no indication for urgent HD on admission  - Restrict fluids, renally-dose medications, monitor    3. Hypokalemia  - Serum potassium is 3.2 in ED  - Replace cautiously given ESRD, repeat chem panel in am    4. Anemia  - Hgb is 8.8 in ED, down from 11.2 one month ago  - Patient denies melena or hematochezia but reports occasional streak of red blood in vomitus  - Type and screen, continue PPI, repeat CBC in am    5. Diet-controlled DM  - A1c was 6.6% in 2019, serum glucose 83 in ED, no longer on diabetes medications  - Update A1c, monitor CBGs     DVT prophylaxis: SCDs   Code Status: DNR  Family Communication: Discussed with patient  Disposition Plan:  Patient is  from: Home  Anticipated d/c is to: TBD Anticipated d/c date is: 7/29 or 10/22/19 Patient currently: Pending GI consultation  Consults called: GI  Admission status: Observation     Vianne Bulls, MD [facility]  10/20/2019, 2:24 AM

## 2019-10-20 NOTE — ED Notes (Signed)
Endo called and states plan to come and take to endo around 945. Pt aware of plan at this time

## 2019-10-21 ENCOUNTER — Encounter (HOSPITAL_COMMUNITY): Payer: Self-pay | Admitting: Gastroenterology

## 2019-10-21 DIAGNOSIS — E119 Type 2 diabetes mellitus without complications: Secondary | ICD-10-CM | POA: Diagnosis not present

## 2019-10-21 DIAGNOSIS — E1129 Type 2 diabetes mellitus with other diabetic kidney complication: Secondary | ICD-10-CM | POA: Diagnosis not present

## 2019-10-21 DIAGNOSIS — N2581 Secondary hyperparathyroidism of renal origin: Secondary | ICD-10-CM | POA: Diagnosis not present

## 2019-10-21 DIAGNOSIS — D631 Anemia in chronic kidney disease: Secondary | ICD-10-CM | POA: Diagnosis not present

## 2019-10-21 DIAGNOSIS — N186 End stage renal disease: Secondary | ICD-10-CM | POA: Diagnosis not present

## 2019-10-21 DIAGNOSIS — D689 Coagulation defect, unspecified: Secondary | ICD-10-CM | POA: Diagnosis not present

## 2019-10-21 DIAGNOSIS — Z992 Dependence on renal dialysis: Secondary | ICD-10-CM | POA: Diagnosis not present

## 2019-10-21 DIAGNOSIS — R52 Pain, unspecified: Secondary | ICD-10-CM | POA: Diagnosis not present

## 2019-10-21 DIAGNOSIS — D509 Iron deficiency anemia, unspecified: Secondary | ICD-10-CM | POA: Diagnosis not present

## 2019-10-21 LAB — SURGICAL PATHOLOGY

## 2019-10-23 DIAGNOSIS — N186 End stage renal disease: Secondary | ICD-10-CM | POA: Diagnosis not present

## 2019-10-23 DIAGNOSIS — N2581 Secondary hyperparathyroidism of renal origin: Secondary | ICD-10-CM | POA: Diagnosis not present

## 2019-10-23 DIAGNOSIS — D689 Coagulation defect, unspecified: Secondary | ICD-10-CM | POA: Diagnosis not present

## 2019-10-23 DIAGNOSIS — E1122 Type 2 diabetes mellitus with diabetic chronic kidney disease: Secondary | ICD-10-CM | POA: Diagnosis not present

## 2019-10-23 DIAGNOSIS — Z992 Dependence on renal dialysis: Secondary | ICD-10-CM | POA: Diagnosis not present

## 2019-10-23 DIAGNOSIS — R52 Pain, unspecified: Secondary | ICD-10-CM | POA: Diagnosis not present

## 2019-10-23 DIAGNOSIS — D631 Anemia in chronic kidney disease: Secondary | ICD-10-CM | POA: Diagnosis not present

## 2019-10-23 DIAGNOSIS — D509 Iron deficiency anemia, unspecified: Secondary | ICD-10-CM | POA: Diagnosis not present

## 2019-10-23 DIAGNOSIS — E1129 Type 2 diabetes mellitus with other diabetic kidney complication: Secondary | ICD-10-CM | POA: Diagnosis not present

## 2019-10-26 DIAGNOSIS — D689 Coagulation defect, unspecified: Secondary | ICD-10-CM | POA: Diagnosis not present

## 2019-10-26 DIAGNOSIS — R52 Pain, unspecified: Secondary | ICD-10-CM | POA: Diagnosis not present

## 2019-10-26 DIAGNOSIS — Z992 Dependence on renal dialysis: Secondary | ICD-10-CM | POA: Diagnosis not present

## 2019-10-26 DIAGNOSIS — E1129 Type 2 diabetes mellitus with other diabetic kidney complication: Secondary | ICD-10-CM | POA: Diagnosis not present

## 2019-10-26 DIAGNOSIS — D509 Iron deficiency anemia, unspecified: Secondary | ICD-10-CM | POA: Diagnosis not present

## 2019-10-26 DIAGNOSIS — N2581 Secondary hyperparathyroidism of renal origin: Secondary | ICD-10-CM | POA: Diagnosis not present

## 2019-10-26 DIAGNOSIS — D631 Anemia in chronic kidney disease: Secondary | ICD-10-CM | POA: Diagnosis not present

## 2019-10-26 DIAGNOSIS — N186 End stage renal disease: Secondary | ICD-10-CM | POA: Diagnosis not present

## 2019-10-28 DIAGNOSIS — R52 Pain, unspecified: Secondary | ICD-10-CM | POA: Diagnosis not present

## 2019-10-28 DIAGNOSIS — Z992 Dependence on renal dialysis: Secondary | ICD-10-CM | POA: Diagnosis not present

## 2019-10-28 DIAGNOSIS — E1129 Type 2 diabetes mellitus with other diabetic kidney complication: Secondary | ICD-10-CM | POA: Diagnosis not present

## 2019-10-28 DIAGNOSIS — D631 Anemia in chronic kidney disease: Secondary | ICD-10-CM | POA: Diagnosis not present

## 2019-10-28 DIAGNOSIS — N2581 Secondary hyperparathyroidism of renal origin: Secondary | ICD-10-CM | POA: Diagnosis not present

## 2019-10-28 DIAGNOSIS — N186 End stage renal disease: Secondary | ICD-10-CM | POA: Diagnosis not present

## 2019-10-28 DIAGNOSIS — D689 Coagulation defect, unspecified: Secondary | ICD-10-CM | POA: Diagnosis not present

## 2019-10-28 DIAGNOSIS — D509 Iron deficiency anemia, unspecified: Secondary | ICD-10-CM | POA: Diagnosis not present

## 2019-10-30 DIAGNOSIS — E1129 Type 2 diabetes mellitus with other diabetic kidney complication: Secondary | ICD-10-CM | POA: Diagnosis not present

## 2019-10-30 DIAGNOSIS — D509 Iron deficiency anemia, unspecified: Secondary | ICD-10-CM | POA: Diagnosis not present

## 2019-10-30 DIAGNOSIS — Z992 Dependence on renal dialysis: Secondary | ICD-10-CM | POA: Diagnosis not present

## 2019-10-30 DIAGNOSIS — N2581 Secondary hyperparathyroidism of renal origin: Secondary | ICD-10-CM | POA: Diagnosis not present

## 2019-10-30 DIAGNOSIS — R52 Pain, unspecified: Secondary | ICD-10-CM | POA: Diagnosis not present

## 2019-10-30 DIAGNOSIS — D689 Coagulation defect, unspecified: Secondary | ICD-10-CM | POA: Diagnosis not present

## 2019-10-30 DIAGNOSIS — D631 Anemia in chronic kidney disease: Secondary | ICD-10-CM | POA: Diagnosis not present

## 2019-10-30 DIAGNOSIS — N186 End stage renal disease: Secondary | ICD-10-CM | POA: Diagnosis not present

## 2019-11-02 DIAGNOSIS — E1129 Type 2 diabetes mellitus with other diabetic kidney complication: Secondary | ICD-10-CM | POA: Diagnosis not present

## 2019-11-02 DIAGNOSIS — D631 Anemia in chronic kidney disease: Secondary | ICD-10-CM | POA: Diagnosis not present

## 2019-11-02 DIAGNOSIS — Z992 Dependence on renal dialysis: Secondary | ICD-10-CM | POA: Diagnosis not present

## 2019-11-02 DIAGNOSIS — N2581 Secondary hyperparathyroidism of renal origin: Secondary | ICD-10-CM | POA: Diagnosis not present

## 2019-11-02 DIAGNOSIS — D509 Iron deficiency anemia, unspecified: Secondary | ICD-10-CM | POA: Diagnosis not present

## 2019-11-02 DIAGNOSIS — D689 Coagulation defect, unspecified: Secondary | ICD-10-CM | POA: Diagnosis not present

## 2019-11-02 DIAGNOSIS — N186 End stage renal disease: Secondary | ICD-10-CM | POA: Diagnosis not present

## 2019-11-02 DIAGNOSIS — R52 Pain, unspecified: Secondary | ICD-10-CM | POA: Diagnosis not present

## 2019-11-04 DIAGNOSIS — D509 Iron deficiency anemia, unspecified: Secondary | ICD-10-CM | POA: Diagnosis not present

## 2019-11-04 DIAGNOSIS — E1129 Type 2 diabetes mellitus with other diabetic kidney complication: Secondary | ICD-10-CM | POA: Diagnosis not present

## 2019-11-04 DIAGNOSIS — D631 Anemia in chronic kidney disease: Secondary | ICD-10-CM | POA: Diagnosis not present

## 2019-11-04 DIAGNOSIS — D689 Coagulation defect, unspecified: Secondary | ICD-10-CM | POA: Diagnosis not present

## 2019-11-04 DIAGNOSIS — R52 Pain, unspecified: Secondary | ICD-10-CM | POA: Diagnosis not present

## 2019-11-04 DIAGNOSIS — N2581 Secondary hyperparathyroidism of renal origin: Secondary | ICD-10-CM | POA: Diagnosis not present

## 2019-11-04 DIAGNOSIS — N186 End stage renal disease: Secondary | ICD-10-CM | POA: Diagnosis not present

## 2019-11-04 DIAGNOSIS — Z992 Dependence on renal dialysis: Secondary | ICD-10-CM | POA: Diagnosis not present

## 2019-11-06 DIAGNOSIS — D509 Iron deficiency anemia, unspecified: Secondary | ICD-10-CM | POA: Diagnosis not present

## 2019-11-06 DIAGNOSIS — D689 Coagulation defect, unspecified: Secondary | ICD-10-CM | POA: Diagnosis not present

## 2019-11-06 DIAGNOSIS — E1129 Type 2 diabetes mellitus with other diabetic kidney complication: Secondary | ICD-10-CM | POA: Diagnosis not present

## 2019-11-06 DIAGNOSIS — Z992 Dependence on renal dialysis: Secondary | ICD-10-CM | POA: Diagnosis not present

## 2019-11-06 DIAGNOSIS — D631 Anemia in chronic kidney disease: Secondary | ICD-10-CM | POA: Diagnosis not present

## 2019-11-06 DIAGNOSIS — R52 Pain, unspecified: Secondary | ICD-10-CM | POA: Diagnosis not present

## 2019-11-06 DIAGNOSIS — N186 End stage renal disease: Secondary | ICD-10-CM | POA: Diagnosis not present

## 2019-11-06 DIAGNOSIS — N2581 Secondary hyperparathyroidism of renal origin: Secondary | ICD-10-CM | POA: Diagnosis not present

## 2019-11-09 DIAGNOSIS — N2581 Secondary hyperparathyroidism of renal origin: Secondary | ICD-10-CM | POA: Diagnosis not present

## 2019-11-09 DIAGNOSIS — D509 Iron deficiency anemia, unspecified: Secondary | ICD-10-CM | POA: Diagnosis not present

## 2019-11-09 DIAGNOSIS — E1129 Type 2 diabetes mellitus with other diabetic kidney complication: Secondary | ICD-10-CM | POA: Diagnosis not present

## 2019-11-09 DIAGNOSIS — R52 Pain, unspecified: Secondary | ICD-10-CM | POA: Diagnosis not present

## 2019-11-09 DIAGNOSIS — D689 Coagulation defect, unspecified: Secondary | ICD-10-CM | POA: Diagnosis not present

## 2019-11-09 DIAGNOSIS — Z992 Dependence on renal dialysis: Secondary | ICD-10-CM | POA: Diagnosis not present

## 2019-11-09 DIAGNOSIS — N186 End stage renal disease: Secondary | ICD-10-CM | POA: Diagnosis not present

## 2019-11-09 DIAGNOSIS — D631 Anemia in chronic kidney disease: Secondary | ICD-10-CM | POA: Diagnosis not present

## 2019-11-11 DIAGNOSIS — R52 Pain, unspecified: Secondary | ICD-10-CM | POA: Diagnosis not present

## 2019-11-11 DIAGNOSIS — D631 Anemia in chronic kidney disease: Secondary | ICD-10-CM | POA: Diagnosis not present

## 2019-11-11 DIAGNOSIS — N2581 Secondary hyperparathyroidism of renal origin: Secondary | ICD-10-CM | POA: Diagnosis not present

## 2019-11-11 DIAGNOSIS — Z992 Dependence on renal dialysis: Secondary | ICD-10-CM | POA: Diagnosis not present

## 2019-11-11 DIAGNOSIS — D509 Iron deficiency anemia, unspecified: Secondary | ICD-10-CM | POA: Diagnosis not present

## 2019-11-11 DIAGNOSIS — E1129 Type 2 diabetes mellitus with other diabetic kidney complication: Secondary | ICD-10-CM | POA: Diagnosis not present

## 2019-11-11 DIAGNOSIS — D689 Coagulation defect, unspecified: Secondary | ICD-10-CM | POA: Diagnosis not present

## 2019-11-11 DIAGNOSIS — N186 End stage renal disease: Secondary | ICD-10-CM | POA: Diagnosis not present

## 2019-11-13 DIAGNOSIS — D689 Coagulation defect, unspecified: Secondary | ICD-10-CM | POA: Diagnosis not present

## 2019-11-13 DIAGNOSIS — N186 End stage renal disease: Secondary | ICD-10-CM | POA: Diagnosis not present

## 2019-11-13 DIAGNOSIS — E1129 Type 2 diabetes mellitus with other diabetic kidney complication: Secondary | ICD-10-CM | POA: Diagnosis not present

## 2019-11-13 DIAGNOSIS — D631 Anemia in chronic kidney disease: Secondary | ICD-10-CM | POA: Diagnosis not present

## 2019-11-13 DIAGNOSIS — N2581 Secondary hyperparathyroidism of renal origin: Secondary | ICD-10-CM | POA: Diagnosis not present

## 2019-11-13 DIAGNOSIS — Z992 Dependence on renal dialysis: Secondary | ICD-10-CM | POA: Diagnosis not present

## 2019-11-13 DIAGNOSIS — R52 Pain, unspecified: Secondary | ICD-10-CM | POA: Diagnosis not present

## 2019-11-13 DIAGNOSIS — D509 Iron deficiency anemia, unspecified: Secondary | ICD-10-CM | POA: Diagnosis not present

## 2019-11-16 DIAGNOSIS — R52 Pain, unspecified: Secondary | ICD-10-CM | POA: Diagnosis not present

## 2019-11-16 DIAGNOSIS — D631 Anemia in chronic kidney disease: Secondary | ICD-10-CM | POA: Diagnosis not present

## 2019-11-16 DIAGNOSIS — E1129 Type 2 diabetes mellitus with other diabetic kidney complication: Secondary | ICD-10-CM | POA: Diagnosis not present

## 2019-11-16 DIAGNOSIS — D689 Coagulation defect, unspecified: Secondary | ICD-10-CM | POA: Diagnosis not present

## 2019-11-16 DIAGNOSIS — Z992 Dependence on renal dialysis: Secondary | ICD-10-CM | POA: Diagnosis not present

## 2019-11-16 DIAGNOSIS — D509 Iron deficiency anemia, unspecified: Secondary | ICD-10-CM | POA: Diagnosis not present

## 2019-11-16 DIAGNOSIS — N186 End stage renal disease: Secondary | ICD-10-CM | POA: Diagnosis not present

## 2019-11-16 DIAGNOSIS — N2581 Secondary hyperparathyroidism of renal origin: Secondary | ICD-10-CM | POA: Diagnosis not present

## 2019-11-18 DIAGNOSIS — D689 Coagulation defect, unspecified: Secondary | ICD-10-CM | POA: Diagnosis not present

## 2019-11-18 DIAGNOSIS — R52 Pain, unspecified: Secondary | ICD-10-CM | POA: Diagnosis not present

## 2019-11-18 DIAGNOSIS — N2581 Secondary hyperparathyroidism of renal origin: Secondary | ICD-10-CM | POA: Diagnosis not present

## 2019-11-18 DIAGNOSIS — N186 End stage renal disease: Secondary | ICD-10-CM | POA: Diagnosis not present

## 2019-11-18 DIAGNOSIS — E1129 Type 2 diabetes mellitus with other diabetic kidney complication: Secondary | ICD-10-CM | POA: Diagnosis not present

## 2019-11-18 DIAGNOSIS — Z992 Dependence on renal dialysis: Secondary | ICD-10-CM | POA: Diagnosis not present

## 2019-11-18 DIAGNOSIS — D509 Iron deficiency anemia, unspecified: Secondary | ICD-10-CM | POA: Diagnosis not present

## 2019-11-18 DIAGNOSIS — D631 Anemia in chronic kidney disease: Secondary | ICD-10-CM | POA: Diagnosis not present

## 2019-11-20 DIAGNOSIS — Z992 Dependence on renal dialysis: Secondary | ICD-10-CM | POA: Diagnosis not present

## 2019-11-20 DIAGNOSIS — N2581 Secondary hyperparathyroidism of renal origin: Secondary | ICD-10-CM | POA: Diagnosis not present

## 2019-11-20 DIAGNOSIS — D689 Coagulation defect, unspecified: Secondary | ICD-10-CM | POA: Diagnosis not present

## 2019-11-20 DIAGNOSIS — D509 Iron deficiency anemia, unspecified: Secondary | ICD-10-CM | POA: Diagnosis not present

## 2019-11-20 DIAGNOSIS — R52 Pain, unspecified: Secondary | ICD-10-CM | POA: Diagnosis not present

## 2019-11-20 DIAGNOSIS — N186 End stage renal disease: Secondary | ICD-10-CM | POA: Diagnosis not present

## 2019-11-20 DIAGNOSIS — D631 Anemia in chronic kidney disease: Secondary | ICD-10-CM | POA: Diagnosis not present

## 2019-11-20 DIAGNOSIS — E1129 Type 2 diabetes mellitus with other diabetic kidney complication: Secondary | ICD-10-CM | POA: Diagnosis not present

## 2019-11-23 DIAGNOSIS — N2581 Secondary hyperparathyroidism of renal origin: Secondary | ICD-10-CM | POA: Diagnosis not present

## 2019-11-23 DIAGNOSIS — N186 End stage renal disease: Secondary | ICD-10-CM | POA: Diagnosis not present

## 2019-11-23 DIAGNOSIS — E1129 Type 2 diabetes mellitus with other diabetic kidney complication: Secondary | ICD-10-CM | POA: Diagnosis not present

## 2019-11-23 DIAGNOSIS — E1122 Type 2 diabetes mellitus with diabetic chronic kidney disease: Secondary | ICD-10-CM | POA: Diagnosis not present

## 2019-11-23 DIAGNOSIS — Z992 Dependence on renal dialysis: Secondary | ICD-10-CM | POA: Diagnosis not present

## 2019-11-23 DIAGNOSIS — D509 Iron deficiency anemia, unspecified: Secondary | ICD-10-CM | POA: Diagnosis not present

## 2019-11-23 DIAGNOSIS — D631 Anemia in chronic kidney disease: Secondary | ICD-10-CM | POA: Diagnosis not present

## 2019-11-23 DIAGNOSIS — R52 Pain, unspecified: Secondary | ICD-10-CM | POA: Diagnosis not present

## 2019-11-23 DIAGNOSIS — D689 Coagulation defect, unspecified: Secondary | ICD-10-CM | POA: Diagnosis not present

## 2019-11-25 DIAGNOSIS — R52 Pain, unspecified: Secondary | ICD-10-CM | POA: Diagnosis not present

## 2019-11-25 DIAGNOSIS — Z992 Dependence on renal dialysis: Secondary | ICD-10-CM | POA: Diagnosis not present

## 2019-11-25 DIAGNOSIS — D689 Coagulation defect, unspecified: Secondary | ICD-10-CM | POA: Diagnosis not present

## 2019-11-25 DIAGNOSIS — N186 End stage renal disease: Secondary | ICD-10-CM | POA: Diagnosis not present

## 2019-11-25 DIAGNOSIS — N2581 Secondary hyperparathyroidism of renal origin: Secondary | ICD-10-CM | POA: Diagnosis not present

## 2019-11-25 DIAGNOSIS — D631 Anemia in chronic kidney disease: Secondary | ICD-10-CM | POA: Diagnosis not present

## 2019-11-25 DIAGNOSIS — D509 Iron deficiency anemia, unspecified: Secondary | ICD-10-CM | POA: Diagnosis not present

## 2019-11-27 DIAGNOSIS — D631 Anemia in chronic kidney disease: Secondary | ICD-10-CM | POA: Diagnosis not present

## 2019-11-27 DIAGNOSIS — D689 Coagulation defect, unspecified: Secondary | ICD-10-CM | POA: Diagnosis not present

## 2019-11-27 DIAGNOSIS — N186 End stage renal disease: Secondary | ICD-10-CM | POA: Diagnosis not present

## 2019-11-27 DIAGNOSIS — D509 Iron deficiency anemia, unspecified: Secondary | ICD-10-CM | POA: Diagnosis not present

## 2019-11-27 DIAGNOSIS — Z992 Dependence on renal dialysis: Secondary | ICD-10-CM | POA: Diagnosis not present

## 2019-11-27 DIAGNOSIS — R52 Pain, unspecified: Secondary | ICD-10-CM | POA: Diagnosis not present

## 2019-11-27 DIAGNOSIS — N2581 Secondary hyperparathyroidism of renal origin: Secondary | ICD-10-CM | POA: Diagnosis not present

## 2019-12-02 DIAGNOSIS — D689 Coagulation defect, unspecified: Secondary | ICD-10-CM | POA: Diagnosis not present

## 2019-12-02 DIAGNOSIS — D631 Anemia in chronic kidney disease: Secondary | ICD-10-CM | POA: Diagnosis not present

## 2019-12-02 DIAGNOSIS — Z992 Dependence on renal dialysis: Secondary | ICD-10-CM | POA: Diagnosis not present

## 2019-12-02 DIAGNOSIS — N2581 Secondary hyperparathyroidism of renal origin: Secondary | ICD-10-CM | POA: Diagnosis not present

## 2019-12-02 DIAGNOSIS — N186 End stage renal disease: Secondary | ICD-10-CM | POA: Diagnosis not present

## 2019-12-02 DIAGNOSIS — R52 Pain, unspecified: Secondary | ICD-10-CM | POA: Diagnosis not present

## 2019-12-02 DIAGNOSIS — D509 Iron deficiency anemia, unspecified: Secondary | ICD-10-CM | POA: Diagnosis not present

## 2019-12-04 DIAGNOSIS — Z992 Dependence on renal dialysis: Secondary | ICD-10-CM | POA: Diagnosis not present

## 2019-12-04 DIAGNOSIS — R52 Pain, unspecified: Secondary | ICD-10-CM | POA: Diagnosis not present

## 2019-12-04 DIAGNOSIS — D689 Coagulation defect, unspecified: Secondary | ICD-10-CM | POA: Diagnosis not present

## 2019-12-04 DIAGNOSIS — N2581 Secondary hyperparathyroidism of renal origin: Secondary | ICD-10-CM | POA: Diagnosis not present

## 2019-12-04 DIAGNOSIS — D631 Anemia in chronic kidney disease: Secondary | ICD-10-CM | POA: Diagnosis not present

## 2019-12-04 DIAGNOSIS — D509 Iron deficiency anemia, unspecified: Secondary | ICD-10-CM | POA: Diagnosis not present

## 2019-12-04 DIAGNOSIS — N186 End stage renal disease: Secondary | ICD-10-CM | POA: Diagnosis not present

## 2019-12-09 DIAGNOSIS — Z992 Dependence on renal dialysis: Secondary | ICD-10-CM | POA: Diagnosis not present

## 2019-12-09 DIAGNOSIS — D689 Coagulation defect, unspecified: Secondary | ICD-10-CM | POA: Diagnosis not present

## 2019-12-09 DIAGNOSIS — D509 Iron deficiency anemia, unspecified: Secondary | ICD-10-CM | POA: Diagnosis not present

## 2019-12-09 DIAGNOSIS — N2581 Secondary hyperparathyroidism of renal origin: Secondary | ICD-10-CM | POA: Diagnosis not present

## 2019-12-09 DIAGNOSIS — R52 Pain, unspecified: Secondary | ICD-10-CM | POA: Diagnosis not present

## 2019-12-09 DIAGNOSIS — N186 End stage renal disease: Secondary | ICD-10-CM | POA: Diagnosis not present

## 2019-12-09 DIAGNOSIS — D631 Anemia in chronic kidney disease: Secondary | ICD-10-CM | POA: Diagnosis not present

## 2019-12-11 DIAGNOSIS — N186 End stage renal disease: Secondary | ICD-10-CM | POA: Diagnosis not present

## 2019-12-11 DIAGNOSIS — N2581 Secondary hyperparathyroidism of renal origin: Secondary | ICD-10-CM | POA: Diagnosis not present

## 2019-12-11 DIAGNOSIS — Z992 Dependence on renal dialysis: Secondary | ICD-10-CM | POA: Diagnosis not present

## 2019-12-11 DIAGNOSIS — D631 Anemia in chronic kidney disease: Secondary | ICD-10-CM | POA: Diagnosis not present

## 2019-12-11 DIAGNOSIS — D509 Iron deficiency anemia, unspecified: Secondary | ICD-10-CM | POA: Diagnosis not present

## 2019-12-11 DIAGNOSIS — R52 Pain, unspecified: Secondary | ICD-10-CM | POA: Diagnosis not present

## 2019-12-11 DIAGNOSIS — D689 Coagulation defect, unspecified: Secondary | ICD-10-CM | POA: Diagnosis not present

## 2019-12-16 DIAGNOSIS — D689 Coagulation defect, unspecified: Secondary | ICD-10-CM | POA: Diagnosis not present

## 2019-12-16 DIAGNOSIS — R52 Pain, unspecified: Secondary | ICD-10-CM | POA: Diagnosis not present

## 2019-12-16 DIAGNOSIS — Z992 Dependence on renal dialysis: Secondary | ICD-10-CM | POA: Diagnosis not present

## 2019-12-16 DIAGNOSIS — D509 Iron deficiency anemia, unspecified: Secondary | ICD-10-CM | POA: Diagnosis not present

## 2019-12-16 DIAGNOSIS — N2581 Secondary hyperparathyroidism of renal origin: Secondary | ICD-10-CM | POA: Diagnosis not present

## 2019-12-16 DIAGNOSIS — D631 Anemia in chronic kidney disease: Secondary | ICD-10-CM | POA: Diagnosis not present

## 2019-12-16 DIAGNOSIS — N186 End stage renal disease: Secondary | ICD-10-CM | POA: Diagnosis not present

## 2019-12-18 DIAGNOSIS — D631 Anemia in chronic kidney disease: Secondary | ICD-10-CM | POA: Diagnosis not present

## 2019-12-18 DIAGNOSIS — N186 End stage renal disease: Secondary | ICD-10-CM | POA: Diagnosis not present

## 2019-12-18 DIAGNOSIS — D689 Coagulation defect, unspecified: Secondary | ICD-10-CM | POA: Diagnosis not present

## 2019-12-18 DIAGNOSIS — D509 Iron deficiency anemia, unspecified: Secondary | ICD-10-CM | POA: Diagnosis not present

## 2019-12-18 DIAGNOSIS — N2581 Secondary hyperparathyroidism of renal origin: Secondary | ICD-10-CM | POA: Diagnosis not present

## 2019-12-18 DIAGNOSIS — Z992 Dependence on renal dialysis: Secondary | ICD-10-CM | POA: Diagnosis not present

## 2019-12-18 DIAGNOSIS — R52 Pain, unspecified: Secondary | ICD-10-CM | POA: Diagnosis not present

## 2019-12-21 DIAGNOSIS — D631 Anemia in chronic kidney disease: Secondary | ICD-10-CM | POA: Diagnosis not present

## 2019-12-21 DIAGNOSIS — E1165 Type 2 diabetes mellitus with hyperglycemia: Secondary | ICD-10-CM | POA: Diagnosis not present

## 2019-12-21 DIAGNOSIS — N2581 Secondary hyperparathyroidism of renal origin: Secondary | ICD-10-CM | POA: Diagnosis not present

## 2019-12-21 DIAGNOSIS — N184 Chronic kidney disease, stage 4 (severe): Secondary | ICD-10-CM | POA: Diagnosis not present

## 2019-12-21 DIAGNOSIS — N186 End stage renal disease: Secondary | ICD-10-CM | POA: Diagnosis not present

## 2019-12-21 DIAGNOSIS — E1142 Type 2 diabetes mellitus with diabetic polyneuropathy: Secondary | ICD-10-CM | POA: Diagnosis not present

## 2019-12-21 DIAGNOSIS — Z23 Encounter for immunization: Secondary | ICD-10-CM | POA: Diagnosis not present

## 2019-12-21 DIAGNOSIS — R52 Pain, unspecified: Secondary | ICD-10-CM | POA: Diagnosis not present

## 2019-12-21 DIAGNOSIS — E782 Mixed hyperlipidemia: Secondary | ICD-10-CM | POA: Diagnosis not present

## 2019-12-21 DIAGNOSIS — D689 Coagulation defect, unspecified: Secondary | ICD-10-CM | POA: Diagnosis not present

## 2019-12-21 DIAGNOSIS — Z992 Dependence on renal dialysis: Secondary | ICD-10-CM | POA: Diagnosis not present

## 2019-12-21 DIAGNOSIS — D509 Iron deficiency anemia, unspecified: Secondary | ICD-10-CM | POA: Diagnosis not present

## 2019-12-21 DIAGNOSIS — E1021 Type 1 diabetes mellitus with diabetic nephropathy: Secondary | ICD-10-CM | POA: Diagnosis not present

## 2019-12-23 DIAGNOSIS — E1122 Type 2 diabetes mellitus with diabetic chronic kidney disease: Secondary | ICD-10-CM | POA: Diagnosis not present

## 2019-12-23 DIAGNOSIS — N186 End stage renal disease: Secondary | ICD-10-CM | POA: Diagnosis not present

## 2019-12-23 DIAGNOSIS — Z992 Dependence on renal dialysis: Secondary | ICD-10-CM | POA: Diagnosis not present

## 2019-12-25 DIAGNOSIS — D509 Iron deficiency anemia, unspecified: Secondary | ICD-10-CM | POA: Diagnosis not present

## 2019-12-25 DIAGNOSIS — R52 Pain, unspecified: Secondary | ICD-10-CM | POA: Diagnosis not present

## 2019-12-25 DIAGNOSIS — N186 End stage renal disease: Secondary | ICD-10-CM | POA: Diagnosis not present

## 2019-12-25 DIAGNOSIS — N2581 Secondary hyperparathyroidism of renal origin: Secondary | ICD-10-CM | POA: Diagnosis not present

## 2019-12-25 DIAGNOSIS — Z992 Dependence on renal dialysis: Secondary | ICD-10-CM | POA: Diagnosis not present

## 2019-12-25 DIAGNOSIS — E1129 Type 2 diabetes mellitus with other diabetic kidney complication: Secondary | ICD-10-CM | POA: Diagnosis not present

## 2019-12-25 DIAGNOSIS — D689 Coagulation defect, unspecified: Secondary | ICD-10-CM | POA: Diagnosis not present

## 2019-12-25 DIAGNOSIS — D631 Anemia in chronic kidney disease: Secondary | ICD-10-CM | POA: Diagnosis not present

## 2019-12-28 DIAGNOSIS — N2581 Secondary hyperparathyroidism of renal origin: Secondary | ICD-10-CM | POA: Diagnosis not present

## 2019-12-28 DIAGNOSIS — N186 End stage renal disease: Secondary | ICD-10-CM | POA: Diagnosis not present

## 2019-12-28 DIAGNOSIS — Z992 Dependence on renal dialysis: Secondary | ICD-10-CM | POA: Diagnosis not present

## 2019-12-28 DIAGNOSIS — D631 Anemia in chronic kidney disease: Secondary | ICD-10-CM | POA: Diagnosis not present

## 2019-12-28 DIAGNOSIS — D689 Coagulation defect, unspecified: Secondary | ICD-10-CM | POA: Diagnosis not present

## 2019-12-28 DIAGNOSIS — E1129 Type 2 diabetes mellitus with other diabetic kidney complication: Secondary | ICD-10-CM | POA: Diagnosis not present

## 2019-12-28 DIAGNOSIS — D509 Iron deficiency anemia, unspecified: Secondary | ICD-10-CM | POA: Diagnosis not present

## 2019-12-28 DIAGNOSIS — R52 Pain, unspecified: Secondary | ICD-10-CM | POA: Diagnosis not present

## 2019-12-30 DIAGNOSIS — Z992 Dependence on renal dialysis: Secondary | ICD-10-CM | POA: Diagnosis not present

## 2019-12-30 DIAGNOSIS — R52 Pain, unspecified: Secondary | ICD-10-CM | POA: Diagnosis not present

## 2019-12-30 DIAGNOSIS — E1129 Type 2 diabetes mellitus with other diabetic kidney complication: Secondary | ICD-10-CM | POA: Diagnosis not present

## 2019-12-30 DIAGNOSIS — N2581 Secondary hyperparathyroidism of renal origin: Secondary | ICD-10-CM | POA: Diagnosis not present

## 2019-12-30 DIAGNOSIS — D689 Coagulation defect, unspecified: Secondary | ICD-10-CM | POA: Diagnosis not present

## 2019-12-30 DIAGNOSIS — D509 Iron deficiency anemia, unspecified: Secondary | ICD-10-CM | POA: Diagnosis not present

## 2019-12-30 DIAGNOSIS — N186 End stage renal disease: Secondary | ICD-10-CM | POA: Diagnosis not present

## 2019-12-30 DIAGNOSIS — D631 Anemia in chronic kidney disease: Secondary | ICD-10-CM | POA: Diagnosis not present

## 2020-01-01 DIAGNOSIS — R52 Pain, unspecified: Secondary | ICD-10-CM | POA: Diagnosis not present

## 2020-01-01 DIAGNOSIS — N186 End stage renal disease: Secondary | ICD-10-CM | POA: Diagnosis not present

## 2020-01-01 DIAGNOSIS — D509 Iron deficiency anemia, unspecified: Secondary | ICD-10-CM | POA: Diagnosis not present

## 2020-01-01 DIAGNOSIS — Z992 Dependence on renal dialysis: Secondary | ICD-10-CM | POA: Diagnosis not present

## 2020-01-01 DIAGNOSIS — D689 Coagulation defect, unspecified: Secondary | ICD-10-CM | POA: Diagnosis not present

## 2020-01-01 DIAGNOSIS — N2581 Secondary hyperparathyroidism of renal origin: Secondary | ICD-10-CM | POA: Diagnosis not present

## 2020-01-01 DIAGNOSIS — E1129 Type 2 diabetes mellitus with other diabetic kidney complication: Secondary | ICD-10-CM | POA: Diagnosis not present

## 2020-01-01 DIAGNOSIS — D631 Anemia in chronic kidney disease: Secondary | ICD-10-CM | POA: Diagnosis not present

## 2020-01-02 ENCOUNTER — Other Ambulatory Visit: Payer: Self-pay

## 2020-01-02 ENCOUNTER — Emergency Department (HOSPITAL_COMMUNITY): Payer: Medicare Other

## 2020-01-02 ENCOUNTER — Ambulatory Visit (HOSPITAL_COMMUNITY)
Admission: EM | Admit: 2020-01-02 | Discharge: 2020-01-02 | Disposition: A | Discharge status: Home / Self Care | Payer: Medicare Other

## 2020-01-02 ENCOUNTER — Emergency Department (HOSPITAL_COMMUNITY)
Admission: EM | Admit: 2020-01-02 | Discharge: 2020-01-03 | Disposition: A | Discharge status: Home / Self Care | Payer: Medicare Other | Attending: Emergency Medicine | Admitting: Emergency Medicine

## 2020-01-02 DIAGNOSIS — Z20822 Contact with and (suspected) exposure to covid-19: Secondary | ICD-10-CM | POA: Insufficient documentation

## 2020-01-02 DIAGNOSIS — I120 Hypertensive chronic kidney disease with stage 5 chronic kidney disease or end stage renal disease: Secondary | ICD-10-CM | POA: Insufficient documentation

## 2020-01-02 DIAGNOSIS — N186 End stage renal disease: Secondary | ICD-10-CM | POA: Diagnosis not present

## 2020-01-02 DIAGNOSIS — E1122 Type 2 diabetes mellitus with diabetic chronic kidney disease: Secondary | ICD-10-CM | POA: Insufficient documentation

## 2020-01-02 DIAGNOSIS — Z79899 Other long term (current) drug therapy: Secondary | ICD-10-CM | POA: Insufficient documentation

## 2020-01-02 DIAGNOSIS — Z87891 Personal history of nicotine dependence: Secondary | ICD-10-CM | POA: Insufficient documentation

## 2020-01-02 DIAGNOSIS — J189 Pneumonia, unspecified organism: Secondary | ICD-10-CM | POA: Diagnosis not present

## 2020-01-02 DIAGNOSIS — R0602 Shortness of breath: Secondary | ICD-10-CM | POA: Diagnosis not present

## 2020-01-02 LAB — COMPREHENSIVE METABOLIC PANEL
ALT: 22 U/L (ref 0–44)
AST: 26 U/L (ref 15–41)
Albumin: 4.3 g/dL (ref 3.5–5.0)
Alkaline Phosphatase: 114 U/L (ref 38–126)
Anion gap: 18 — ABNORMAL HIGH (ref 5–15)
BUN: 24 mg/dL — ABNORMAL HIGH (ref 8–23)
CO2: 26 mmol/L (ref 22–32)
Calcium: 10.2 mg/dL (ref 8.9–10.3)
Chloride: 94 mmol/L — ABNORMAL LOW (ref 98–111)
Creatinine, Ser: 8.45 mg/dL — ABNORMAL HIGH (ref 0.61–1.24)
GFR, Estimated: 6 mL/min — ABNORMAL LOW (ref >60)
Glucose, Bld: 102 mg/dL — ABNORMAL HIGH (ref 70–99)
Potassium: 3.7 mmol/L (ref 3.5–5.1)
Sodium: 138 mmol/L (ref 135–145)
Total Bilirubin: 1.3 mg/dL — ABNORMAL HIGH (ref 0.3–1.2)
Total Protein: 8.2 g/dL — ABNORMAL HIGH (ref 6.5–8.1)

## 2020-01-02 LAB — RESP PANEL BY RT PCR (RSV, FLU A&B, COVID)
Influenza A by PCR: NEGATIVE
Influenza B by PCR: NEGATIVE
Respiratory Syncytial Virus by PCR: NEGATIVE
SARS Coronavirus 2 by RT PCR: NEGATIVE

## 2020-01-02 LAB — CBC WITH DIFFERENTIAL/PLATELET
Abs Immature Granulocytes: 0.01 10*3/uL (ref 0.00–0.07)
Basophils Absolute: 0.1 10*3/uL (ref 0.0–0.1)
Basophils Relative: 1 %
Eosinophils Absolute: 0.4 10*3/uL (ref 0.0–0.5)
Eosinophils Relative: 11 %
HCT: 34.9 % — ABNORMAL LOW (ref 39.0–52.0)
Hemoglobin: 10.6 g/dL — ABNORMAL LOW (ref 13.0–17.0)
Immature Granulocytes: 0 %
Lymphocytes Relative: 19 %
Lymphs Abs: 0.8 10*3/uL (ref 0.7–4.0)
MCH: 27.9 pg (ref 26.0–34.0)
MCHC: 30.4 g/dL (ref 30.0–36.0)
MCV: 91.8 fL (ref 80.0–100.0)
Monocytes Absolute: 0.3 10*3/uL (ref 0.1–1.0)
Monocytes Relative: 7 %
Neutro Abs: 2.6 10*3/uL (ref 1.7–7.7)
Neutrophils Relative %: 62 %
Platelets: 174 10*3/uL (ref 150–400)
RBC: 3.8 MIL/uL — ABNORMAL LOW (ref 4.22–5.81)
RDW: 18.4 % — ABNORMAL HIGH (ref 11.5–15.5)
WBC: 4.2 10*3/uL (ref 4.0–10.5)
nRBC: 0 % (ref 0.0–0.2)

## 2020-01-02 NOTE — ED Triage Notes (Addendum)
C/o short of breath that has been going on for 2-3 days--  Hx dialysis, T, TH, Sat -- c/o cold like symptoms, "sick like the flu" - "No energy" , has had both vaccines for covid and flu.  Sent from urgent care

## 2020-01-02 NOTE — ED Triage Notes (Addendum)
Patient I with c/o SOB that started 3 days ago and spitting up a lot of dark red blood that started this morning approx 10 minutes after drinking a cup of cold water.    Also c/o runny nose, congestion, fatigue and productive cough  States he is a dialysis pt, last session was yesterday.  Denies fever, cp, n/v, diarrhea,   Dr. Meda Coffee made aware of patient status. In to triage to evaluate patient. Pt advised that he needs to go to ER. Wheeled to ED by UC staff.

## 2020-01-02 NOTE — ED Notes (Signed)
Patient is being discharged from the Urgent Care and sent to the Emergency Department via wheelchair by UC staff . Per Meda Coffee, MD, patient is in need of higher level of care due to sob, abdominal pain and spitting up blood. Patient is aware and verbalizes understanding of plan of care.  Vitals:   01/02/20 1448 01/02/20 1456  BP: (!) 150/9 (!) 150/90  Pulse: 87   Resp: (!) 22   Temp: (!) 97.4 F (36.3 C)   SpO2: 100%

## 2020-01-03 MED ORDER — BENZONATATE 100 MG PO CAPS
100.0000 mg | ORAL_CAPSULE | Freq: Once | ORAL | Status: AC
  Administered 2020-01-03: 100 mg via ORAL
  Filled 2020-01-03: qty 1

## 2020-01-03 MED ORDER — AZITHROMYCIN 250 MG PO TABS
250.0000 mg | ORAL_TABLET | Freq: Every day | ORAL | 0 refills | Status: DC
Start: 2020-01-03 — End: 2020-01-03

## 2020-01-03 MED ORDER — BENZONATATE 100 MG PO CAPS: 100.0000 mg | ORAL_CAPSULE | Freq: Three times a day (TID) | ORAL | 0 refills | Status: DC

## 2020-01-03 MED ORDER — AZITHROMYCIN 250 MG PO TABS
500.0000 mg | ORAL_TABLET | Freq: Once | ORAL | Status: AC
  Administered 2020-01-03: 500 mg via ORAL
  Filled 2020-01-03: qty 2

## 2020-01-03 MED ORDER — AZITHROMYCIN 250 MG PO TABS
250.0000 mg | ORAL_TABLET | Freq: Every day | ORAL | 0 refills | Status: DC
Start: 2020-01-03 — End: 2021-03-20

## 2020-01-03 NOTE — ED Provider Notes (Addendum)
[facility] EMERGENCY DEPARTMENT Provider Note   CSN: [id number] Arrival date & time: 01/02/20  1513     History Chief Complaint  Patient presents with  . Shortness of Breath    Jonathan Mcneil is a 64 y.o. male.  Patient with history of ESRD-HD (T, Th, S, compliant), T2DM, HTN, schizophrenia, presents with complaint of "no energy", cough, vomiting with hematemesis x 1 earlier today. The patient is a vague historian and difficult to direct. He relays a history of admission months ago for colonoscopy and EGD and that is when his "no energy" started. He reports over the last several days he has had a cough productive of "foamy" phlegm and earlier today coughed to the point of vomiting x 1 that appeared to have a significant amount of blood. No vomiting or nausea since that time. No diarrhea or bloody stools. He reports getting iron regularly at dialysis so his stool always appears dark. No known fever, chills, congestion, sore throat. He denies chest pain but c/o abdominal pain on the left abdomen.  The history is provided by the patient. No language interpreter was used.  Shortness of Breath Associated symptoms: abdominal pain, cough and vomiting   Associated symptoms: no chest pain and no fever        Past Medical History:  Diagnosis Date  . Diabetes mellitus without complication ([place])   . ESRD (end stage renal disease) ([place])   . GERD (gastroesophageal reflux disease)    PMH  . Hypertension   . Low back pain   . Metabolic acidosis   . Neuromuscular disorder ([place])    peripheral neuropathy  . Pancreatitis   . Schizophrenia ([place])    does not take medications    Patient Active Problem List   Diagnosis Date Noted  . Abdominal pain with vomiting 10/20/2019  . Hypokalemia 10/20/2019  . Normocytic anemia 10/20/2019  . Hyperkalemia   . Acute pancreatitis 07/26/2019  . Uremia 02/24/2018  . History of anemia due to CKD 02/24/2018  . Marijuana abuse  02/24/2018  . ESRD (end stage renal disease) ([place])   . Hypertension   . Schizophrenia ([place])   . Metabolic acidosis 27/05/5007  . Hypertensive urgency 01/15/2018  . Type 2 diabetes mellitus with stage 4 chronic kidney disease ([place]) 01/15/2018  . CKD (chronic kidney disease) 02/14/2015  . Acute kidney injury superimposed on chronic kidney disease ([place]) 02/14/2015  . Hyponatremia 02/14/2015  . Back pain 02/14/2015  . Neuropathy 02/14/2015  . Obesity 02/14/2015  . Abdominal pain 02/14/2015  . Pancreatitis   . Diabetes mellitus without complication ([place])   . Pancreatitis, acute   . Schizo-affective psychosis ([place]) 04/28/2013    Past Surgical History:  Procedure Laterality Date  . A/V FISTULAGRAM Left 06/03/2018   Procedure: A/V FISTULAGRAM;  Surgeon: Marty Heck, MD;  Location: [facility];  Service: Cardiovascular;  Laterality: Left;  . AV FISTULA PLACEMENT Left 02/11/2018   Procedure: INSERTION OF ARTERIOVENOUS (AV) FISTULA LEFT  ARM;  Surgeon: [name], MD;  Location: [facility];  Service: Vascular;  Laterality: Left;  . BASCILIC VEIN TRANSPOSITION Left 04/17/2018   Procedure: BASILIC VEIN TRANSPOSITION SECOND STAGE;  Surgeon: [name], MD;  Location: [facility];  Service: Vascular;  Laterality: Left;  . BIOPSY  10/20/2019   Procedure: BIOPSY;  Surgeon: Clarene Essex, MD;  Location: [facility];  Service: Endoscopy;;  . COLONOSCOPY    . DENTAL SURGERY    . ESOPHAGOGASTRODUODENOSCOPY (EGD) WITH  PROPOFOL N/A 10/20/2019   Procedure: ESOPHAGOGASTRODUODENOSCOPY (EGD) WITH PROPOFOL;  Surgeon: Clarene Essex, MD;  Location: [facility];  Service: Endoscopy;  Laterality: N/A;  . INSERTION OF DIALYSIS CATHETER Right 02/25/2018   Procedure: INSERTION OF DIALYSIS CATHETER;  Surgeon: [name], MD;  Location: [facility];  Service: Vascular;  Laterality: Right;  . PERIPHERAL VASCULAR BALLOON ANGIOPLASTY  06/03/2018   Procedure: PERIPHERAL VASCULAR  BALLOON ANGIOPLASTY;  Surgeon: Marty Heck, MD;  Location: [facility];  Service: Cardiovascular;;  left a/v fistula       Family History  Problem Relation Age of Onset  . Kidney failure Mother     Social History   Tobacco Use  . Smoking status: Former Research scientist (life sciences)  . Smokeless tobacco: Never Used  Vaping Use  . Vaping Use: Never used  Substance Use Topics  . Alcohol use: No  . Drug use: Not Currently    Types: Cocaine, Marijuana    Comment: smoked marijuana a few days ago; he denies using cocaine    Home Medications Prior to Admission medications   Medication Sig Start Date End Date Taking? Authorizing Provider  acetaminophen (TYLENOL) 650 MG CR tablet Take 1,300 mg by mouth 2 (two) times daily as needed for pain.    [provider]  amLODipine (NORVASC) 10 MG tablet Take 10 mg by mouth at bedtime.  09/24/17   [provider]  calcitRIOL (ROCALTROL) 0.25 MCG capsule Take 1 capsule (0.25 mcg total) by mouth every Monday, Wednesday, and Friday with hemodialysis. Patient not taking: Reported on 09/15/2019 03/02/18   Lavina Hamman, MD  Multiple Vitamins-Minerals (MULTIVITAMIN) LIQD Take 32 oz by mouth daily.    [provider]  pantoprazole (PROTONIX) 20 MG tablet Take 1 tablet (20 mg total) by mouth daily. Patient not taking: Reported on 10/20/2019 09/15/19   Varney Biles, MD  pantoprazole (PROTONIX) 40 MG tablet Take 40 mg by mouth daily. 10/11/19   [provider]  promethazine (PHENERGAN) 25 MG suppository Place 1 suppository (25 mg total) rectally every 8 (eight) hours as needed for nausea or vomiting. 09/15/19   Varney Biles, MD  promethazine (PHENERGAN) 25 MG tablet Take 25 mg by mouth 3 (three) times daily. 10/11/19   [provider]  sucralfate (CARAFATE) 1 g tablet Take 1 tablet (1 g total) by mouth 4 (four) times daily -  with meals and at bedtime. 09/15/19   Varney Biles, MD  VELPHORO 500 MG chewable tablet Chew  500 mg by mouth 3 (three) times daily. 10/12/19   [provider]    Allergies    Ibuprofen and Penicillins  Review of Systems   Review of Systems  Constitutional: Positive for fatigue. Negative for chills and fever.  HENT: Negative.   Eyes: Negative for visual disturbance.  Respiratory: Positive for cough and shortness of breath.   Cardiovascular: Negative.  Negative for chest pain and leg swelling.  Gastrointestinal: Positive for abdominal pain and vomiting. Negative for blood in stool and nausea.  Genitourinary: Negative for flank pain.  Musculoskeletal: Negative.  Negative for myalgias and neck stiffness.  Skin: Negative.  Negative for color change and wound.  Neurological: Negative.  Negative for dizziness and light-headedness.  Psychiatric/Behavioral: Negative for confusion.    Physical Exam Updated Vital Signs BP (!) 142/105 (BP Location: Right Arm)   Pulse 90   Temp 97.6 F (36.4 C) (Oral)   Resp 16   SpO2 99%   Physical Exam Vitals and nursing note reviewed.  Constitutional:      General: He is not in acute distress.    Appearance: He is well-developed. He is obese. He is not ill-appearing.  HENT:     Head: Normocephalic and atraumatic.     Mouth/Throat:     Mouth: Mucous membranes are moist.  Cardiovascular:     Rate and Rhythm: Normal rate and regular rhythm.     Heart sounds: No murmur heard.   Pulmonary:     Effort: Pulmonary effort is normal. No tachypnea.     Breath sounds: No wheezing, rhonchi or rales.  Chest:     Chest wall: No tenderness.  Abdominal:     Palpations: Abdomen is soft.     Tenderness: There is no abdominal tenderness.  Musculoskeletal:        General: Normal range of motion.     Cervical back: Normal range of motion and neck supple.  Skin:    General: Skin is warm and dry.  Neurological:     Mental Status: He is alert and oriented to person, place, and time.     ED Results / Procedures / Treatments   Labs (all  labs ordered are listed, but only abnormal results are displayed) Labs Reviewed  CBC WITH DIFFERENTIAL/PLATELET - Abnormal; Notable for the following components:      Result Value   RBC 3.80 (*)    Hemoglobin 10.6 (*)    HCT 34.9 (*)    RDW 18.4 (*)    All other components within normal limits  COMPREHENSIVE METABOLIC PANEL - Abnormal; Notable for the following components:   Chloride 94 (*)    Glucose, Bld 102 (*)    BUN 24 (*)    Creatinine, Ser 8.45 (*)    Total Protein 8.2 (*)    Total Bilirubin 1.3 (*)    GFR, Estimated 6 (*)    Anion gap 18 (*)    All other components within normal limits  RESP PANEL BY RT PCR (RSV, FLU A&B, COVID)    EKG None  Radiology DG Chest 2 View  Result Date: 01/02/2020 CLINICAL DATA:  Shortness of breath. EXAM: CHEST - 2 VIEW COMPARISON:  Chest radiograph 09/15/2019 FINDINGS: Stable cardiac and mediastinal contours. Interval development of patchy opacities within the right mid lower lung. No pleural effusion or pneumothorax. Regional skeleton is unremarkable. IMPRESSION: Patchy opacities right mid and lower lung may represent atelectasis or infection. Electronically Signed   By: Lovey Newcomer M.D.   On: 01/02/2020 17:12    Procedures Procedures (including critical care time)  Medications Ordered in ED Medications - No data to display  ED Course  I have reviewed the triage vital signs and the nursing notes.  Pertinent labs & imaging results that were available during my care of the patient were reviewed by me and considered in my medical decision making (see chart for details).    MDM Rules/Calculators/A&P                          Patient to ED with predominant symptoms of cough and shortness of breath. He complains of having no energy, however, this started months ago. Reports vomiting "blood" earlier today.   Patient is very well appearing. No vomiting in ED. CXR c/w right PNA. No hypoxia or tachypnea. No indication for sepsis. COVID  negative in vaccinated patient. Labs are essentially baseline otherwise.   He can be discharged home. Will start abx for CAP. Encouraged PCP  follow up in 3 days.   On discharge the patient has emesis bag with nonbloody emesis. He denies nausea and reports emesis occurs only after coughing. Tessalon ordered prior to discharge to aid cough relief.   Final Clinical Impression(s) / ED Diagnoses Final diagnoses:  None   1. CAP  Rx / DC Orders ED Discharge Orders    None       Charlann Lange, PA-C 01/03/20 0219    Charlann Lange, PA-C 01/03/20 7903    Merrily Pew, MD 01/03/20 [phone]

## 2020-01-03 NOTE — Discharge Instructions (Signed)
Your tests show that you have pneumonia, which explains your symptoms of cough and shortness of breath. Take the antibiotic (Zithromax) as prescribed. The prescription for Tessalon is to help with cough.   Follow up with your doctor for recheck in 3 days and return here with any worsening symptoms or new concerns.

## 2020-01-04 DIAGNOSIS — R52 Pain, unspecified: Secondary | ICD-10-CM | POA: Diagnosis not present

## 2020-01-04 DIAGNOSIS — E1129 Type 2 diabetes mellitus with other diabetic kidney complication: Secondary | ICD-10-CM | POA: Diagnosis not present

## 2020-01-04 DIAGNOSIS — D689 Coagulation defect, unspecified: Secondary | ICD-10-CM | POA: Diagnosis not present

## 2020-01-04 DIAGNOSIS — N2581 Secondary hyperparathyroidism of renal origin: Secondary | ICD-10-CM | POA: Diagnosis not present

## 2020-01-04 DIAGNOSIS — D509 Iron deficiency anemia, unspecified: Secondary | ICD-10-CM | POA: Diagnosis not present

## 2020-01-04 DIAGNOSIS — N186 End stage renal disease: Secondary | ICD-10-CM | POA: Diagnosis not present

## 2020-01-04 DIAGNOSIS — Z992 Dependence on renal dialysis: Secondary | ICD-10-CM | POA: Diagnosis not present

## 2020-01-04 DIAGNOSIS — D631 Anemia in chronic kidney disease: Secondary | ICD-10-CM | POA: Diagnosis not present

## 2020-01-08 DIAGNOSIS — D631 Anemia in chronic kidney disease: Secondary | ICD-10-CM | POA: Diagnosis not present

## 2020-01-08 DIAGNOSIS — Z992 Dependence on renal dialysis: Secondary | ICD-10-CM | POA: Diagnosis not present

## 2020-01-08 DIAGNOSIS — D509 Iron deficiency anemia, unspecified: Secondary | ICD-10-CM | POA: Diagnosis not present

## 2020-01-08 DIAGNOSIS — N186 End stage renal disease: Secondary | ICD-10-CM | POA: Diagnosis not present

## 2020-01-08 DIAGNOSIS — E1129 Type 2 diabetes mellitus with other diabetic kidney complication: Secondary | ICD-10-CM | POA: Diagnosis not present

## 2020-01-08 DIAGNOSIS — D689 Coagulation defect, unspecified: Secondary | ICD-10-CM | POA: Diagnosis not present

## 2020-01-08 DIAGNOSIS — N2581 Secondary hyperparathyroidism of renal origin: Secondary | ICD-10-CM | POA: Diagnosis not present

## 2020-01-08 DIAGNOSIS — R52 Pain, unspecified: Secondary | ICD-10-CM | POA: Diagnosis not present

## 2020-01-11 DIAGNOSIS — R52 Pain, unspecified: Secondary | ICD-10-CM | POA: Diagnosis not present

## 2020-01-11 DIAGNOSIS — D689 Coagulation defect, unspecified: Secondary | ICD-10-CM | POA: Diagnosis not present

## 2020-01-11 DIAGNOSIS — Z992 Dependence on renal dialysis: Secondary | ICD-10-CM | POA: Diagnosis not present

## 2020-01-11 DIAGNOSIS — N186 End stage renal disease: Secondary | ICD-10-CM | POA: Diagnosis not present

## 2020-01-11 DIAGNOSIS — D509 Iron deficiency anemia, unspecified: Secondary | ICD-10-CM | POA: Diagnosis not present

## 2020-01-11 DIAGNOSIS — D631 Anemia in chronic kidney disease: Secondary | ICD-10-CM | POA: Diagnosis not present

## 2020-01-11 DIAGNOSIS — N2581 Secondary hyperparathyroidism of renal origin: Secondary | ICD-10-CM | POA: Diagnosis not present

## 2020-01-11 DIAGNOSIS — E1129 Type 2 diabetes mellitus with other diabetic kidney complication: Secondary | ICD-10-CM | POA: Diagnosis not present

## 2020-01-13 DIAGNOSIS — R52 Pain, unspecified: Secondary | ICD-10-CM | POA: Diagnosis not present

## 2020-01-13 DIAGNOSIS — Z992 Dependence on renal dialysis: Secondary | ICD-10-CM | POA: Diagnosis not present

## 2020-01-13 DIAGNOSIS — N186 End stage renal disease: Secondary | ICD-10-CM | POA: Diagnosis not present

## 2020-01-13 DIAGNOSIS — E1129 Type 2 diabetes mellitus with other diabetic kidney complication: Secondary | ICD-10-CM | POA: Diagnosis not present

## 2020-01-13 DIAGNOSIS — D509 Iron deficiency anemia, unspecified: Secondary | ICD-10-CM | POA: Diagnosis not present

## 2020-01-13 DIAGNOSIS — D631 Anemia in chronic kidney disease: Secondary | ICD-10-CM | POA: Diagnosis not present

## 2020-01-13 DIAGNOSIS — N2581 Secondary hyperparathyroidism of renal origin: Secondary | ICD-10-CM | POA: Diagnosis not present

## 2020-01-13 DIAGNOSIS — D689 Coagulation defect, unspecified: Secondary | ICD-10-CM | POA: Diagnosis not present

## 2020-01-15 DIAGNOSIS — E1129 Type 2 diabetes mellitus with other diabetic kidney complication: Secondary | ICD-10-CM | POA: Diagnosis not present

## 2020-01-15 DIAGNOSIS — D689 Coagulation defect, unspecified: Secondary | ICD-10-CM | POA: Diagnosis not present

## 2020-01-15 DIAGNOSIS — N2581 Secondary hyperparathyroidism of renal origin: Secondary | ICD-10-CM | POA: Diagnosis not present

## 2020-01-15 DIAGNOSIS — Z992 Dependence on renal dialysis: Secondary | ICD-10-CM | POA: Diagnosis not present

## 2020-01-15 DIAGNOSIS — N186 End stage renal disease: Secondary | ICD-10-CM | POA: Diagnosis not present

## 2020-01-15 DIAGNOSIS — D509 Iron deficiency anemia, unspecified: Secondary | ICD-10-CM | POA: Diagnosis not present

## 2020-01-15 DIAGNOSIS — D631 Anemia in chronic kidney disease: Secondary | ICD-10-CM | POA: Diagnosis not present

## 2020-01-15 DIAGNOSIS — R52 Pain, unspecified: Secondary | ICD-10-CM | POA: Diagnosis not present

## 2020-01-18 DIAGNOSIS — N186 End stage renal disease: Secondary | ICD-10-CM | POA: Diagnosis not present

## 2020-01-18 DIAGNOSIS — D631 Anemia in chronic kidney disease: Secondary | ICD-10-CM | POA: Diagnosis not present

## 2020-01-18 DIAGNOSIS — N2581 Secondary hyperparathyroidism of renal origin: Secondary | ICD-10-CM | POA: Diagnosis not present

## 2020-01-18 DIAGNOSIS — D689 Coagulation defect, unspecified: Secondary | ICD-10-CM | POA: Diagnosis not present

## 2020-01-18 DIAGNOSIS — E1129 Type 2 diabetes mellitus with other diabetic kidney complication: Secondary | ICD-10-CM | POA: Diagnosis not present

## 2020-01-18 DIAGNOSIS — R52 Pain, unspecified: Secondary | ICD-10-CM | POA: Diagnosis not present

## 2020-01-18 DIAGNOSIS — Z992 Dependence on renal dialysis: Secondary | ICD-10-CM | POA: Diagnosis not present

## 2020-01-18 DIAGNOSIS — D509 Iron deficiency anemia, unspecified: Secondary | ICD-10-CM | POA: Diagnosis not present

## 2020-01-20 DIAGNOSIS — D509 Iron deficiency anemia, unspecified: Secondary | ICD-10-CM | POA: Diagnosis not present

## 2020-01-20 DIAGNOSIS — E1129 Type 2 diabetes mellitus with other diabetic kidney complication: Secondary | ICD-10-CM | POA: Diagnosis not present

## 2020-01-20 DIAGNOSIS — N186 End stage renal disease: Secondary | ICD-10-CM | POA: Diagnosis not present

## 2020-01-20 DIAGNOSIS — N2581 Secondary hyperparathyroidism of renal origin: Secondary | ICD-10-CM | POA: Diagnosis not present

## 2020-01-20 DIAGNOSIS — R52 Pain, unspecified: Secondary | ICD-10-CM | POA: Diagnosis not present

## 2020-01-20 DIAGNOSIS — D689 Coagulation defect, unspecified: Secondary | ICD-10-CM | POA: Diagnosis not present

## 2020-01-20 DIAGNOSIS — Z992 Dependence on renal dialysis: Secondary | ICD-10-CM | POA: Diagnosis not present

## 2020-01-20 DIAGNOSIS — D631 Anemia in chronic kidney disease: Secondary | ICD-10-CM | POA: Diagnosis not present

## 2020-01-20 DIAGNOSIS — E119 Type 2 diabetes mellitus without complications: Secondary | ICD-10-CM | POA: Diagnosis not present

## 2020-01-22 DIAGNOSIS — D689 Coagulation defect, unspecified: Secondary | ICD-10-CM | POA: Diagnosis not present

## 2020-01-22 DIAGNOSIS — E1129 Type 2 diabetes mellitus with other diabetic kidney complication: Secondary | ICD-10-CM | POA: Diagnosis not present

## 2020-01-22 DIAGNOSIS — D509 Iron deficiency anemia, unspecified: Secondary | ICD-10-CM | POA: Diagnosis not present

## 2020-01-22 DIAGNOSIS — Z992 Dependence on renal dialysis: Secondary | ICD-10-CM | POA: Diagnosis not present

## 2020-01-22 DIAGNOSIS — N2581 Secondary hyperparathyroidism of renal origin: Secondary | ICD-10-CM | POA: Diagnosis not present

## 2020-01-22 DIAGNOSIS — D631 Anemia in chronic kidney disease: Secondary | ICD-10-CM | POA: Diagnosis not present

## 2020-01-22 DIAGNOSIS — N186 End stage renal disease: Secondary | ICD-10-CM | POA: Diagnosis not present

## 2020-01-22 DIAGNOSIS — R52 Pain, unspecified: Secondary | ICD-10-CM | POA: Diagnosis not present

## 2020-01-23 DIAGNOSIS — E1122 Type 2 diabetes mellitus with diabetic chronic kidney disease: Secondary | ICD-10-CM | POA: Diagnosis not present

## 2020-01-23 DIAGNOSIS — N186 End stage renal disease: Secondary | ICD-10-CM | POA: Diagnosis not present

## 2020-01-23 DIAGNOSIS — Z992 Dependence on renal dialysis: Secondary | ICD-10-CM | POA: Diagnosis not present

## 2020-01-25 DIAGNOSIS — R52 Pain, unspecified: Secondary | ICD-10-CM | POA: Diagnosis not present

## 2020-01-25 DIAGNOSIS — Z992 Dependence on renal dialysis: Secondary | ICD-10-CM | POA: Diagnosis not present

## 2020-01-25 DIAGNOSIS — N186 End stage renal disease: Secondary | ICD-10-CM | POA: Diagnosis not present

## 2020-01-25 DIAGNOSIS — E1129 Type 2 diabetes mellitus with other diabetic kidney complication: Secondary | ICD-10-CM | POA: Diagnosis not present

## 2020-01-25 DIAGNOSIS — D631 Anemia in chronic kidney disease: Secondary | ICD-10-CM | POA: Diagnosis not present

## 2020-01-25 DIAGNOSIS — D689 Coagulation defect, unspecified: Secondary | ICD-10-CM | POA: Diagnosis not present

## 2020-01-25 DIAGNOSIS — N2581 Secondary hyperparathyroidism of renal origin: Secondary | ICD-10-CM | POA: Diagnosis not present

## 2020-01-25 DIAGNOSIS — D509 Iron deficiency anemia, unspecified: Secondary | ICD-10-CM | POA: Diagnosis not present

## 2020-01-27 DIAGNOSIS — N186 End stage renal disease: Secondary | ICD-10-CM | POA: Diagnosis not present

## 2020-01-27 DIAGNOSIS — D689 Coagulation defect, unspecified: Secondary | ICD-10-CM | POA: Diagnosis not present

## 2020-01-27 DIAGNOSIS — E1129 Type 2 diabetes mellitus with other diabetic kidney complication: Secondary | ICD-10-CM | POA: Diagnosis not present

## 2020-01-27 DIAGNOSIS — R52 Pain, unspecified: Secondary | ICD-10-CM | POA: Diagnosis not present

## 2020-01-27 DIAGNOSIS — D509 Iron deficiency anemia, unspecified: Secondary | ICD-10-CM | POA: Diagnosis not present

## 2020-01-27 DIAGNOSIS — D631 Anemia in chronic kidney disease: Secondary | ICD-10-CM | POA: Diagnosis not present

## 2020-01-27 DIAGNOSIS — Z992 Dependence on renal dialysis: Secondary | ICD-10-CM | POA: Diagnosis not present

## 2020-01-27 DIAGNOSIS — N2581 Secondary hyperparathyroidism of renal origin: Secondary | ICD-10-CM | POA: Diagnosis not present

## 2020-01-29 DIAGNOSIS — D689 Coagulation defect, unspecified: Secondary | ICD-10-CM | POA: Diagnosis not present

## 2020-01-29 DIAGNOSIS — E1129 Type 2 diabetes mellitus with other diabetic kidney complication: Secondary | ICD-10-CM | POA: Diagnosis not present

## 2020-01-29 DIAGNOSIS — D509 Iron deficiency anemia, unspecified: Secondary | ICD-10-CM | POA: Diagnosis not present

## 2020-01-29 DIAGNOSIS — D631 Anemia in chronic kidney disease: Secondary | ICD-10-CM | POA: Diagnosis not present

## 2020-01-29 DIAGNOSIS — R52 Pain, unspecified: Secondary | ICD-10-CM | POA: Diagnosis not present

## 2020-01-29 DIAGNOSIS — N186 End stage renal disease: Secondary | ICD-10-CM | POA: Diagnosis not present

## 2020-01-29 DIAGNOSIS — Z992 Dependence on renal dialysis: Secondary | ICD-10-CM | POA: Diagnosis not present

## 2020-01-29 DIAGNOSIS — N2581 Secondary hyperparathyroidism of renal origin: Secondary | ICD-10-CM | POA: Diagnosis not present

## 2020-02-01 DIAGNOSIS — Z992 Dependence on renal dialysis: Secondary | ICD-10-CM | POA: Diagnosis not present

## 2020-02-01 DIAGNOSIS — R52 Pain, unspecified: Secondary | ICD-10-CM | POA: Diagnosis not present

## 2020-02-01 DIAGNOSIS — N186 End stage renal disease: Secondary | ICD-10-CM | POA: Diagnosis not present

## 2020-02-01 DIAGNOSIS — D689 Coagulation defect, unspecified: Secondary | ICD-10-CM | POA: Diagnosis not present

## 2020-02-01 DIAGNOSIS — E1129 Type 2 diabetes mellitus with other diabetic kidney complication: Secondary | ICD-10-CM | POA: Diagnosis not present

## 2020-02-01 DIAGNOSIS — N2581 Secondary hyperparathyroidism of renal origin: Secondary | ICD-10-CM | POA: Diagnosis not present

## 2020-02-01 DIAGNOSIS — D509 Iron deficiency anemia, unspecified: Secondary | ICD-10-CM | POA: Diagnosis not present

## 2020-02-01 DIAGNOSIS — D631 Anemia in chronic kidney disease: Secondary | ICD-10-CM | POA: Diagnosis not present

## 2020-02-03 DIAGNOSIS — D509 Iron deficiency anemia, unspecified: Secondary | ICD-10-CM | POA: Diagnosis not present

## 2020-02-03 DIAGNOSIS — R52 Pain, unspecified: Secondary | ICD-10-CM | POA: Diagnosis not present

## 2020-02-03 DIAGNOSIS — D631 Anemia in chronic kidney disease: Secondary | ICD-10-CM | POA: Diagnosis not present

## 2020-02-03 DIAGNOSIS — E1129 Type 2 diabetes mellitus with other diabetic kidney complication: Secondary | ICD-10-CM | POA: Diagnosis not present

## 2020-02-03 DIAGNOSIS — N2581 Secondary hyperparathyroidism of renal origin: Secondary | ICD-10-CM | POA: Diagnosis not present

## 2020-02-03 DIAGNOSIS — Z992 Dependence on renal dialysis: Secondary | ICD-10-CM | POA: Diagnosis not present

## 2020-02-03 DIAGNOSIS — N186 End stage renal disease: Secondary | ICD-10-CM | POA: Diagnosis not present

## 2020-02-03 DIAGNOSIS — D689 Coagulation defect, unspecified: Secondary | ICD-10-CM | POA: Diagnosis not present

## 2020-02-05 DIAGNOSIS — Z992 Dependence on renal dialysis: Secondary | ICD-10-CM | POA: Diagnosis not present

## 2020-02-05 DIAGNOSIS — N186 End stage renal disease: Secondary | ICD-10-CM | POA: Diagnosis not present

## 2020-02-05 DIAGNOSIS — R52 Pain, unspecified: Secondary | ICD-10-CM | POA: Diagnosis not present

## 2020-02-05 DIAGNOSIS — N2581 Secondary hyperparathyroidism of renal origin: Secondary | ICD-10-CM | POA: Diagnosis not present

## 2020-02-05 DIAGNOSIS — D509 Iron deficiency anemia, unspecified: Secondary | ICD-10-CM | POA: Diagnosis not present

## 2020-02-05 DIAGNOSIS — D631 Anemia in chronic kidney disease: Secondary | ICD-10-CM | POA: Diagnosis not present

## 2020-02-05 DIAGNOSIS — D689 Coagulation defect, unspecified: Secondary | ICD-10-CM | POA: Diagnosis not present

## 2020-02-05 DIAGNOSIS — E1129 Type 2 diabetes mellitus with other diabetic kidney complication: Secondary | ICD-10-CM | POA: Diagnosis not present

## 2020-02-08 DIAGNOSIS — Z992 Dependence on renal dialysis: Secondary | ICD-10-CM | POA: Diagnosis not present

## 2020-02-08 DIAGNOSIS — D509 Iron deficiency anemia, unspecified: Secondary | ICD-10-CM | POA: Diagnosis not present

## 2020-02-08 DIAGNOSIS — D631 Anemia in chronic kidney disease: Secondary | ICD-10-CM | POA: Diagnosis not present

## 2020-02-08 DIAGNOSIS — R52 Pain, unspecified: Secondary | ICD-10-CM | POA: Diagnosis not present

## 2020-02-08 DIAGNOSIS — N186 End stage renal disease: Secondary | ICD-10-CM | POA: Diagnosis not present

## 2020-02-08 DIAGNOSIS — N2581 Secondary hyperparathyroidism of renal origin: Secondary | ICD-10-CM | POA: Diagnosis not present

## 2020-02-08 DIAGNOSIS — E1129 Type 2 diabetes mellitus with other diabetic kidney complication: Secondary | ICD-10-CM | POA: Diagnosis not present

## 2020-02-08 DIAGNOSIS — D689 Coagulation defect, unspecified: Secondary | ICD-10-CM | POA: Diagnosis not present

## 2020-02-10 DIAGNOSIS — N2581 Secondary hyperparathyroidism of renal origin: Secondary | ICD-10-CM | POA: Diagnosis not present

## 2020-02-10 DIAGNOSIS — D689 Coagulation defect, unspecified: Secondary | ICD-10-CM | POA: Diagnosis not present

## 2020-02-10 DIAGNOSIS — N186 End stage renal disease: Secondary | ICD-10-CM | POA: Diagnosis not present

## 2020-02-10 DIAGNOSIS — D631 Anemia in chronic kidney disease: Secondary | ICD-10-CM | POA: Diagnosis not present

## 2020-02-10 DIAGNOSIS — E1129 Type 2 diabetes mellitus with other diabetic kidney complication: Secondary | ICD-10-CM | POA: Diagnosis not present

## 2020-02-10 DIAGNOSIS — Z992 Dependence on renal dialysis: Secondary | ICD-10-CM | POA: Diagnosis not present

## 2020-02-10 DIAGNOSIS — D509 Iron deficiency anemia, unspecified: Secondary | ICD-10-CM | POA: Diagnosis not present

## 2020-02-10 DIAGNOSIS — R52 Pain, unspecified: Secondary | ICD-10-CM | POA: Diagnosis not present

## 2020-02-12 DIAGNOSIS — N186 End stage renal disease: Secondary | ICD-10-CM | POA: Diagnosis not present

## 2020-02-12 DIAGNOSIS — E1129 Type 2 diabetes mellitus with other diabetic kidney complication: Secondary | ICD-10-CM | POA: Diagnosis not present

## 2020-02-12 DIAGNOSIS — D509 Iron deficiency anemia, unspecified: Secondary | ICD-10-CM | POA: Diagnosis not present

## 2020-02-12 DIAGNOSIS — Z992 Dependence on renal dialysis: Secondary | ICD-10-CM | POA: Diagnosis not present

## 2020-02-12 DIAGNOSIS — D631 Anemia in chronic kidney disease: Secondary | ICD-10-CM | POA: Diagnosis not present

## 2020-02-12 DIAGNOSIS — R52 Pain, unspecified: Secondary | ICD-10-CM | POA: Diagnosis not present

## 2020-02-12 DIAGNOSIS — D689 Coagulation defect, unspecified: Secondary | ICD-10-CM | POA: Diagnosis not present

## 2020-02-12 DIAGNOSIS — N2581 Secondary hyperparathyroidism of renal origin: Secondary | ICD-10-CM | POA: Diagnosis not present

## 2020-02-14 DIAGNOSIS — D631 Anemia in chronic kidney disease: Secondary | ICD-10-CM | POA: Diagnosis not present

## 2020-02-14 DIAGNOSIS — N186 End stage renal disease: Secondary | ICD-10-CM | POA: Diagnosis not present

## 2020-02-14 DIAGNOSIS — E1129 Type 2 diabetes mellitus with other diabetic kidney complication: Secondary | ICD-10-CM | POA: Diagnosis not present

## 2020-02-14 DIAGNOSIS — D509 Iron deficiency anemia, unspecified: Secondary | ICD-10-CM | POA: Diagnosis not present

## 2020-02-14 DIAGNOSIS — D689 Coagulation defect, unspecified: Secondary | ICD-10-CM | POA: Diagnosis not present

## 2020-02-14 DIAGNOSIS — N2581 Secondary hyperparathyroidism of renal origin: Secondary | ICD-10-CM | POA: Diagnosis not present

## 2020-02-14 DIAGNOSIS — R52 Pain, unspecified: Secondary | ICD-10-CM | POA: Diagnosis not present

## 2020-02-14 DIAGNOSIS — Z992 Dependence on renal dialysis: Secondary | ICD-10-CM | POA: Diagnosis not present

## 2020-02-16 DIAGNOSIS — Z992 Dependence on renal dialysis: Secondary | ICD-10-CM | POA: Diagnosis not present

## 2020-02-16 DIAGNOSIS — E1129 Type 2 diabetes mellitus with other diabetic kidney complication: Secondary | ICD-10-CM | POA: Diagnosis not present

## 2020-02-16 DIAGNOSIS — R52 Pain, unspecified: Secondary | ICD-10-CM | POA: Diagnosis not present

## 2020-02-16 DIAGNOSIS — N186 End stage renal disease: Secondary | ICD-10-CM | POA: Diagnosis not present

## 2020-02-16 DIAGNOSIS — D631 Anemia in chronic kidney disease: Secondary | ICD-10-CM | POA: Diagnosis not present

## 2020-02-16 DIAGNOSIS — D689 Coagulation defect, unspecified: Secondary | ICD-10-CM | POA: Diagnosis not present

## 2020-02-16 DIAGNOSIS — N2581 Secondary hyperparathyroidism of renal origin: Secondary | ICD-10-CM | POA: Diagnosis not present

## 2020-02-16 DIAGNOSIS — D509 Iron deficiency anemia, unspecified: Secondary | ICD-10-CM | POA: Diagnosis not present

## 2020-02-19 DIAGNOSIS — D689 Coagulation defect, unspecified: Secondary | ICD-10-CM | POA: Diagnosis not present

## 2020-02-19 DIAGNOSIS — N186 End stage renal disease: Secondary | ICD-10-CM | POA: Diagnosis not present

## 2020-02-19 DIAGNOSIS — N2581 Secondary hyperparathyroidism of renal origin: Secondary | ICD-10-CM | POA: Diagnosis not present

## 2020-02-19 DIAGNOSIS — D631 Anemia in chronic kidney disease: Secondary | ICD-10-CM | POA: Diagnosis not present

## 2020-02-19 DIAGNOSIS — E1129 Type 2 diabetes mellitus with other diabetic kidney complication: Secondary | ICD-10-CM | POA: Diagnosis not present

## 2020-02-19 DIAGNOSIS — R52 Pain, unspecified: Secondary | ICD-10-CM | POA: Diagnosis not present

## 2020-02-19 DIAGNOSIS — D509 Iron deficiency anemia, unspecified: Secondary | ICD-10-CM | POA: Diagnosis not present

## 2020-02-19 DIAGNOSIS — Z992 Dependence on renal dialysis: Secondary | ICD-10-CM | POA: Diagnosis not present

## 2020-02-22 DIAGNOSIS — D689 Coagulation defect, unspecified: Secondary | ICD-10-CM | POA: Diagnosis not present

## 2020-02-22 DIAGNOSIS — Z992 Dependence on renal dialysis: Secondary | ICD-10-CM | POA: Diagnosis not present

## 2020-02-22 DIAGNOSIS — E1129 Type 2 diabetes mellitus with other diabetic kidney complication: Secondary | ICD-10-CM | POA: Diagnosis not present

## 2020-02-22 DIAGNOSIS — D631 Anemia in chronic kidney disease: Secondary | ICD-10-CM | POA: Diagnosis not present

## 2020-02-22 DIAGNOSIS — N186 End stage renal disease: Secondary | ICD-10-CM | POA: Diagnosis not present

## 2020-02-22 DIAGNOSIS — E1122 Type 2 diabetes mellitus with diabetic chronic kidney disease: Secondary | ICD-10-CM | POA: Diagnosis not present

## 2020-02-22 DIAGNOSIS — R52 Pain, unspecified: Secondary | ICD-10-CM | POA: Diagnosis not present

## 2020-02-22 DIAGNOSIS — N2581 Secondary hyperparathyroidism of renal origin: Secondary | ICD-10-CM | POA: Diagnosis not present

## 2020-02-22 DIAGNOSIS — D509 Iron deficiency anemia, unspecified: Secondary | ICD-10-CM | POA: Diagnosis not present

## 2020-02-24 DIAGNOSIS — D689 Coagulation defect, unspecified: Secondary | ICD-10-CM | POA: Diagnosis not present

## 2020-02-24 DIAGNOSIS — Z992 Dependence on renal dialysis: Secondary | ICD-10-CM | POA: Diagnosis not present

## 2020-02-24 DIAGNOSIS — E1129 Type 2 diabetes mellitus with other diabetic kidney complication: Secondary | ICD-10-CM | POA: Diagnosis not present

## 2020-02-24 DIAGNOSIS — R52 Pain, unspecified: Secondary | ICD-10-CM | POA: Diagnosis not present

## 2020-02-24 DIAGNOSIS — N2581 Secondary hyperparathyroidism of renal origin: Secondary | ICD-10-CM | POA: Diagnosis not present

## 2020-02-24 DIAGNOSIS — L299 Pruritus, unspecified: Secondary | ICD-10-CM | POA: Diagnosis not present

## 2020-02-24 DIAGNOSIS — N186 End stage renal disease: Secondary | ICD-10-CM | POA: Diagnosis not present

## 2020-02-26 DIAGNOSIS — D689 Coagulation defect, unspecified: Secondary | ICD-10-CM | POA: Diagnosis not present

## 2020-02-26 DIAGNOSIS — L299 Pruritus, unspecified: Secondary | ICD-10-CM | POA: Diagnosis not present

## 2020-02-26 DIAGNOSIS — Z992 Dependence on renal dialysis: Secondary | ICD-10-CM | POA: Diagnosis not present

## 2020-02-26 DIAGNOSIS — N2581 Secondary hyperparathyroidism of renal origin: Secondary | ICD-10-CM | POA: Diagnosis not present

## 2020-02-26 DIAGNOSIS — R52 Pain, unspecified: Secondary | ICD-10-CM | POA: Diagnosis not present

## 2020-02-26 DIAGNOSIS — N186 End stage renal disease: Secondary | ICD-10-CM | POA: Diagnosis not present

## 2020-02-26 DIAGNOSIS — E1129 Type 2 diabetes mellitus with other diabetic kidney complication: Secondary | ICD-10-CM | POA: Diagnosis not present

## 2020-02-28 DIAGNOSIS — H43393 Other vitreous opacities, bilateral: Secondary | ICD-10-CM | POA: Diagnosis not present

## 2020-02-28 DIAGNOSIS — H40033 Anatomical narrow angle, bilateral: Secondary | ICD-10-CM | POA: Diagnosis not present

## 2020-02-29 DIAGNOSIS — N186 End stage renal disease: Secondary | ICD-10-CM | POA: Diagnosis not present

## 2020-02-29 DIAGNOSIS — D689 Coagulation defect, unspecified: Secondary | ICD-10-CM | POA: Diagnosis not present

## 2020-02-29 DIAGNOSIS — Z992 Dependence on renal dialysis: Secondary | ICD-10-CM | POA: Diagnosis not present

## 2020-02-29 DIAGNOSIS — E1129 Type 2 diabetes mellitus with other diabetic kidney complication: Secondary | ICD-10-CM | POA: Diagnosis not present

## 2020-02-29 DIAGNOSIS — L299 Pruritus, unspecified: Secondary | ICD-10-CM | POA: Diagnosis not present

## 2020-02-29 DIAGNOSIS — R52 Pain, unspecified: Secondary | ICD-10-CM | POA: Diagnosis not present

## 2020-02-29 DIAGNOSIS — N2581 Secondary hyperparathyroidism of renal origin: Secondary | ICD-10-CM | POA: Diagnosis not present

## 2020-03-01 ENCOUNTER — Encounter (INDEPENDENT_AMBULATORY_CARE_PROVIDER_SITE_OTHER): Payer: Self-pay | Admitting: Ophthalmology

## 2020-03-01 ENCOUNTER — Other Ambulatory Visit: Payer: Self-pay

## 2020-03-01 ENCOUNTER — Ambulatory Visit (INDEPENDENT_AMBULATORY_CARE_PROVIDER_SITE_OTHER): Payer: Medicare Other | Admitting: Ophthalmology

## 2020-03-01 DIAGNOSIS — H3581 Retinal edema: Secondary | ICD-10-CM

## 2020-03-01 DIAGNOSIS — H4311 Vitreous hemorrhage, right eye: Secondary | ICD-10-CM | POA: Diagnosis not present

## 2020-03-01 DIAGNOSIS — E113513 Type 2 diabetes mellitus with proliferative diabetic retinopathy with macular edema, bilateral: Secondary | ICD-10-CM | POA: Diagnosis not present

## 2020-03-01 DIAGNOSIS — I10 Essential (primary) hypertension: Secondary | ICD-10-CM

## 2020-03-01 DIAGNOSIS — H35033 Hypertensive retinopathy, bilateral: Secondary | ICD-10-CM | POA: Diagnosis not present

## 2020-03-01 DIAGNOSIS — H25813 Combined forms of age-related cataract, bilateral: Secondary | ICD-10-CM

## 2020-03-01 MED ORDER — BEVACIZUMAB CHEMO INJECTION 1.25MG/0.05ML SYRINGE FOR KALEIDOSCOPE
1.2500 mg | INTRAVITREAL | Status: AC | PRN
  Administered 2020-03-01: 1.25 mg via INTRAVITREAL

## 2020-03-01 NOTE — Progress Notes (Signed)
[facility] Note  03/01/2020     CHIEF COMPLAINT Patient presents for Retina Evaluation   HISTORY OF PRESENT ILLNESS: Jonathan Mcneil is a 64 y.o. male who presents to the clinic today for:   HPI    Retina Evaluation    In right eye.  This started 2 days ago.  Duration of 2.  Associated Symptoms Floaters and Weight Loss.  I, the attending physician,  performed the HPI with the patient and updated documentation appropriately.          Comments    Patient here for Retinal Evaluation. Referred by Dr Marin Comment. Patient states sees blood in OD. it is red and green. Dr Marin Comment saw blood in OD. No eye pain. On dialysis Tuesday, Thursday, and Saturday. Last A1C 5.5.       Last edited by Bernarda Caffey, MD on 03/01/2020  9:14 AM. (History)    pt is here on the referral of Dr. Marin Comment for concern of vitreous hemorrhage OD, pt states he saw her 2 days ago bc he coughed and started seeing floaters in his right eye, he states Dr. Marin Comment told him she saw bleeding in his eye, pt is on dialysis, but is not on a blood thinner or aspirin, pt states he is no longer diabetic, but used to be and took oral medication as well as insulin, pt states blood pressure is usually under control as well  Referring physician: Marin Comment [address] [place], [place],  [place] [id number]  HISTORICAL INFORMATION:   Selected notes from the MEDICAL RECORD NUMBER Referred by Dr. Maryjane Hurter for heme OD LEE:  Ocular Hx- PMH-    CURRENT MEDICATIONS: No current outpatient medications on file. (Ophthalmic Drugs)   No current facility-administered medications for this visit. (Ophthalmic Drugs)   Current Outpatient Medications (Other)  Medication Sig  . acetaminophen (TYLENOL) 650 MG CR tablet Take 1,300 mg by mouth 2 (two) times daily as needed for pain.  Marland Kitchen amLODipine (NORVASC) 10 MG tablet Take 10 mg by mouth at bedtime.   Marland Kitchen azithromycin (ZITHROMAX Z-PAK) 250 MG tablet Take 1 tablet (250 mg total) by  mouth daily.  . benzonatate (TESSALON) 100 MG capsule Take 1 capsule (100 mg total) by mouth every 8 (eight) hours.  . calcitRIOL (ROCALTROL) 0.25 MCG capsule Take 1 capsule (0.25 mcg total) by mouth every Monday, Wednesday, and Friday with hemodialysis. (Patient not taking: Reported on 09/15/2019)  . Multiple Vitamins-Minerals (MULTIVITAMIN) LIQD Take 32 oz by mouth daily.  . pantoprazole (PROTONIX) 20 MG tablet Take 1 tablet (20 mg total) by mouth daily. (Patient not taking: Reported on 10/20/2019)  . pantoprazole (PROTONIX) 40 MG tablet Take 40 mg by mouth daily.  . promethazine (PHENERGAN) 25 MG suppository Place 1 suppository (25 mg total) rectally every 8 (eight) hours as needed for nausea or vomiting.  . promethazine (PHENERGAN) 25 MG tablet Take 25 mg by mouth 3 (three) times daily.  . sucralfate (CARAFATE) 1 g tablet Take 1 tablet (1 g total) by mouth 4 (four) times daily -  with meals and at bedtime.  . VELPHORO 500 MG chewable tablet Chew 500 mg by mouth 3 (three) times daily.   No current facility-administered medications for this visit. (Other)      REVIEW OF SYSTEMS: ROS    Positive for: Neurological, Genitourinary, Endocrine, Eyes, Psychiatric   Negative for: Constitutional, Gastrointestinal, Skin, Musculoskeletal, HENT, Cardiovascular, Respiratory, Allergic/Imm, Heme/Lymph   Last edited  by Leonie Douglas, COA on 03/01/2020  8:44 AM. (History)       ALLERGIES Allergies  Allergen Reactions  . Ibuprofen Other (See Comments)    Unknown reaction  . Penicillins Itching and Other (See Comments)    Has patient had a PCN reaction causing immediate rash, facial/tongue/throat swelling, SOB or lightheadedness with hypotension: No Has patient had a PCN reaction causing severe rash involving mucus membranes or skin necrosis: No Has patient had a PCN reaction that required hospitalization No Has patient had a PCN reaction occurring within the last 10 years: Unknown If all of the above  answers are "NO", then may proceed with Cephalosporin use.    PAST MEDICAL HISTORY Past Medical History:  Diagnosis Date  . Diabetes mellitus without complication ([place])   . ESRD (end stage renal disease) ([place])   . GERD (gastroesophageal reflux disease)    PMH  . Hypertension   . Low back pain   . Metabolic acidosis   . Neuromuscular disorder ([place])    peripheral neuropathy  . Pancreatitis   . Schizophrenia ([place])    does not take medications   Past Surgical History:  Procedure Laterality Date  . A/V FISTULAGRAM Left 06/03/2018   Procedure: A/V FISTULAGRAM;  Surgeon: Marty Heck, MD;  Location: [facility];  Service: Cardiovascular;  Laterality: Left;  . AV FISTULA PLACEMENT Left 02/11/2018   Procedure: INSERTION OF ARTERIOVENOUS (AV) FISTULA LEFT  ARM;  Surgeon: [name], MD;  Location: [facility];  Service: Vascular;  Laterality: Left;  . BASCILIC VEIN TRANSPOSITION Left 04/17/2018   Procedure: BASILIC VEIN TRANSPOSITION SECOND STAGE;  Surgeon: [name], MD;  Location: [facility];  Service: Vascular;  Laterality: Left;  . BIOPSY  10/20/2019   Procedure: BIOPSY;  Surgeon: Clarene Essex, MD;  Location: [facility];  Service: Endoscopy;;  . COLONOSCOPY    . DENTAL SURGERY    . ESOPHAGOGASTRODUODENOSCOPY (EGD) WITH PROPOFOL N/A 10/20/2019   Procedure: ESOPHAGOGASTRODUODENOSCOPY (EGD) WITH PROPOFOL;  Surgeon: Clarene Essex, MD;  Location: [facility];  Service: Endoscopy;  Laterality: N/A;  . INSERTION OF DIALYSIS CATHETER Right 02/25/2018   Procedure: INSERTION OF DIALYSIS CATHETER;  Surgeon: [name], MD;  Location: [facility];  Service: Vascular;  Laterality: Right;  . PERIPHERAL VASCULAR BALLOON ANGIOPLASTY  06/03/2018   Procedure: PERIPHERAL VASCULAR BALLOON ANGIOPLASTY;  Surgeon: Marty Heck, MD;  Location: [facility];  Service: Cardiovascular;;  left a/v fistula    FAMILY HISTORY Family History  Problem Relation  Age of Onset  . Kidney failure Mother   . Diabetes Father   . Blindness Brother     SOCIAL HISTORY Social History   Tobacco Use  . Smoking status: Former Research scientist (life sciences)  . Smokeless tobacco: Never Used  Vaping Use  . Vaping Use: Never used  Substance Use Topics  . Alcohol use: No  . Drug use: Not Currently    Types: Cocaine, Marijuana    Comment: smoked marijuana a few days ago; he denies using cocaine         OPHTHALMIC EXAM:  Base Eye Exam    Visual Acuity (Snellen - Linear)      Right Left   Dist [place] CF at 3' 20/20   Dist ph [place] NI        Tonometry (Tonopen, 8:28 AM)      Right Left   Pressure 21 17       Pupils      Dark Light  Shape React APD   Right 3 2 Round Minimal None   Left 3 2 Round Minimal None       Visual Fields      Left Right    Full    Restrictions  Partial inner superior temporal, inferior temporal, superior nasal, inferior nasal deficiencies       Extraocular Movement      Right Left    Full Full       Neuro/Psych    Oriented x3: Yes   Mood/Affect: Normal       Dilation    Both eyes: 1.0% Mydriacyl, 2.5% Phenylephrine @ 8:28 AM        Slit Lamp and Fundus Exam    Slit Lamp Exam      Right Left   Lids/Lashes Dermatochalasis - upper lid Dermatochalasis - upper lid   Conjunctiva/Sclera Melanosis Melanosis, temporal pinguecula   Cornea 1+ Punctate epithelial erosions, mild arcus 1+ Punctate epithelial erosions, mild arcus   Anterior Chamber Deep and quiet Deep and quiet   Iris Round and dilated, No NVI Round and dilated, No NVI   Lens 2-3+ Nuclear sclerosis, 2-3+ Cortical cataract 2-3+ Nuclear sclerosis, 2-3+ Cortical cataract   Vitreous Vitreous syneresis, +RBC, blood stained vitreous condensations, subhyaloid heme inferior and nasal to disc Vitreous syneresis, +RBC       Fundus Exam      Right Left   Disc Mild Pallor, Sharp rim Mild Pallor, Sharp rim   C/D Ratio 0.5 0.6   Macula hazy view, grossly flat, scattered Microaneurysms  Flat, Blunted foveal reflex, RPE mottling and clumping, scattered Microaneurysms greatest temporal macula   Vessels attenuated, Tortuous, +NV nasal midzone attenuated, Tortuous, +NV nasal to disc   Periphery Attached, boat shaped subhyaloid heme inferior and nasal to disc, 360 IRH/DBH    Attached, scattered IRH/DBH greatest nasal           Refraction    Manifest Refraction      Sphere Dist VA   Right [place] NI   Left    Rolling sphere's, no improvement with refraction.  Patient unable to see the screen.           IMAGING AND PROCEDURES  Imaging and Procedures for 03/01/2020  OCT, Retina - OU - Both Eyes       Right Eye Quality was poor. Central Foveal Thickness: 452. Progression has no prior data. Findings include abnormal foveal contour, no IRF, no SRF (+vitreous opacitites, subhyaloid hyper reflective material nasal to disc caught on widefield).   Left Eye Quality was good. Central Foveal Thickness: 258. Progression has no prior data. Findings include normal foveal contour, no IRF, no SRF, intraretinal fluid (+IRF/cystic changes temporal macula; Partial PVD).   Notes *Images captured and stored on drive  Diagnosis / Impression:  OD: +vitreous opacities, subhyaloid hyper reflective material nasal to disc caught on widefield OS: NFP, no SRF; +IRF/cystic changes temporal macula  Clinical management:  See below  Abbreviations: NFP - Normal foveal profile. CME - cystoid macular edema. PED - pigment epithelial detachment. IRF - intraretinal fluid. SRF - subretinal fluid. EZ - ellipsoid zone. ERM - epiretinal membrane. ORA - outer retinal atrophy. ORT - outer retinal tubulation. SRHM - subretinal hyper-reflective material. IRHM - intraretinal hyper-reflective material        Fluorescein Angiography Optos (Transit OD)       Right Eye   Progression has no prior data. Early phase findings include delayed filling, blockage, vascular perfusion defect, retinal  neovascularization, microaneurysm. Mid/Late phase findings include blockage, microaneurysm, leakage, retinal neovascularization, vascular perfusion defect (Boat shaped subhyaloid heme inferior and nasal to disc, patches of vascular non-perfusion with florid NV nasal to disc).   Left Eye   Progression has no prior data. Early phase findings include microaneurysm, vascular perfusion defect, retinal neovascularization, leakage. Mid/Late phase findings include leakage, microaneurysm, retinal neovascularization, vascular perfusion defect (Vascular non-perfusion with florid NV IN midzone).   Notes **Images stored on drive**  Impression: PDR OU Patches of vascular nonperfusion nasal periphery with florid NVE OU Leaking MA OU OD with delayed filling time         Intravitreal Injection, Pharmacologic Agent - OD - Right Eye       Time Out 03/01/2020. 10:24 AM. Confirmed correct patient, procedure, site, and patient consented.   Anesthesia Topical anesthesia was used. Anesthetic medications included Lidocaine 2%, Proparacaine 0.5%.   Procedure Preparation included 5% betadine to ocular surface, eyelid speculum. A supplied needle was used.   Injection:  1.25 mg Bevacizumab (AVASTIN) 1.25mg /0.70mL SOLN   NDC: 44315-400-86, Lot: [id number], Expiration date: 03/16/2020   Route: Intravitreal, Site: Right Eye, Waste: 0.05 mL  Post-op Post injection exam found visual acuity of at least counting fingers. The patient tolerated the procedure well. There were no complications. The patient received written and verbal post procedure care education.                 ASSESSMENT/PLAN:    ICD-10-CM   1. Proliferative diabetic retinopathy of both eyes with macular edema associated with type 2 diabetes mellitus (HCC)  T26.7124 Intravitreal Injection, Pharmacologic Agent - OD - Right Eye    Bevacizumab (AVASTIN) SOLN 1.25 mg  2. Retinal edema  H35.81 OCT, Retina - OU - Both Eyes  3. Vitreous  hemorrhage, right eye ([place])  H43.11   4. Essential hypertension  I10   5. Hypertensive retinopathy of both eyes  H35.033 Fluorescein Angiography Optos (Transit OD)  6. Combined forms of age-related cataract of both eyes  H25.813     1,2. Proliferative diabetic retinopathy OU (OD > OS) - The incidence, risk factors for progression, natural history and treatment options for diabetic retinopathy were discussed with patient.   - The need for close monitoring of blood glucose, blood pressure, and serum lipids, avoiding cigarette or any type of tobacco, and the need for long term follow up was also discussed with patient. - exam shows preretinal/subhyaloid heme OD, OS with just scattered MA, IRH - FA today (12.08.21) shows late-leaking MA, vascular nonperfusion, +NV OU (nasal midzone) - OCT with mild cystic changes OS; diffuse atrophy/thinning OU  - discussed findings and prognosis - recommend IVA OU for PDR/NVE -- OD #1 today, 12.08.21 - pt wishes to proceed with injection - RBA of procedure discussed, questions answered - IVA informed consent obtained and signed, 12.08.21 (OU) - see procedure note - f/u Friday, December 10 for IVA OS  3. Vitreous Hemorrhage OD  - acute onset with violent coughing spell  - underlying etiology related to NV/PDR as described above  - VH precautions reviewed -- minimize activities, keep head elevated, avoid ASA/NSAIDs/blood thinners as able  4,5. Hypertensive retinopathy OU - discussed importance of tight BP control - monitor  6. Mixed Cataract OU - The symptoms of cataract, surgical options, and treatments and risks were discussed with patient. - discussed diagnosis and progression - not yet visually significant - monitor for now     Ophthalmic Meds Ordered this visit:  Meds ordered  this encounter  Medications  . Bevacizumab (AVASTIN) SOLN 1.25 mg       Return in about 2 days (around 03/03/2020) for f/u PDR OU, DFE, OCT.  There are no Patient  Instructions on file for this visit.   Explained the diagnoses, plan, and follow up with the patient and they expressed understanding.  Patient expressed understanding of the importance of proper follow up care.   This document serves as a record of services personally performed by Gardiner Sleeper, MD, PhD. It was created on their behalf by San Jetty. Owens Shark, OA an ophthalmic technician. The creation of this record is the provider's dictation and/or activities during the visit.    Electronically signed by: San Jetty. Owens Shark, [place] 12.08.2021 1:29 PM   Gardiner Sleeper, M.D., Ph.D. Diseases & Surgery of the Retina and Vitreous [facility]  I have reviewed the above documentation for accuracy and completeness, and I agree with the above. Gardiner Sleeper, M.D., Ph.D. 03/01/20 1:29 PM   Abbreviations: M myopia (nearsighted); A astigmatism; H hyperopia (farsighted); P presbyopia; Mrx spectacle prescription;  CTL contact lenses; OD right eye; OS left eye; OU both eyes  XT exotropia; ET esotropia; PEK punctate epithelial keratitis; PEE punctate epithelial erosions; DES dry eye syndrome; MGD meibomian gland dysfunction; ATs artificial tears; PFAT's preservative free artificial tears; [place] nuclear sclerotic cataract; PSC posterior subcapsular cataract; ERM epi-retinal membrane; PVD posterior vitreous detachment; RD retinal detachment; DM diabetes mellitus; DR diabetic retinopathy; NPDR non-proliferative diabetic retinopathy; PDR proliferative diabetic retinopathy; CSME clinically significant macular edema; DME diabetic macular edema; dbh dot blot hemorrhages; CWS cotton wool spot; POAG primary open angle glaucoma; C/D cup-to-disc ratio; HVF humphrey visual field; GVF goldmann visual field; OCT optical coherence tomography; IOP intraocular pressure; BRVO Branch retinal vein occlusion; CRVO central retinal vein occlusion; CRAO central retinal artery occlusion; BRAO branch retinal artery  occlusion; RT retinal tear; SB scleral buckle; PPV pars plana vitrectomy; VH Vitreous hemorrhage; PRP panretinal laser photocoagulation; IVK intravitreal kenalog; VMT vitreomacular traction; MH Macular hole;  NVD neovascularization of the disc; NVE neovascularization elsewhere; AREDS age related eye disease study; ARMD age related macular degeneration; POAG primary open angle glaucoma; EBMD epithelial/anterior basement membrane dystrophy; ACIOL anterior chamber intraocular lens; IOL intraocular lens; PCIOL posterior chamber intraocular lens; Phaco/IOL phacoemulsification with intraocular lens placement; [place] photorefractive keratectomy; LASIK laser assisted in situ keratomileusis; HTN hypertension; DM diabetes mellitus; COPD chronic obstructive pulmonary disease

## 2020-03-02 DIAGNOSIS — D689 Coagulation defect, unspecified: Secondary | ICD-10-CM | POA: Diagnosis not present

## 2020-03-02 DIAGNOSIS — E1129 Type 2 diabetes mellitus with other diabetic kidney complication: Secondary | ICD-10-CM | POA: Diagnosis not present

## 2020-03-02 DIAGNOSIS — N2581 Secondary hyperparathyroidism of renal origin: Secondary | ICD-10-CM | POA: Diagnosis not present

## 2020-03-02 DIAGNOSIS — L299 Pruritus, unspecified: Secondary | ICD-10-CM | POA: Diagnosis not present

## 2020-03-02 DIAGNOSIS — R52 Pain, unspecified: Secondary | ICD-10-CM | POA: Diagnosis not present

## 2020-03-02 DIAGNOSIS — N186 End stage renal disease: Secondary | ICD-10-CM | POA: Diagnosis not present

## 2020-03-02 DIAGNOSIS — Z992 Dependence on renal dialysis: Secondary | ICD-10-CM | POA: Diagnosis not present

## 2020-03-03 ENCOUNTER — Ambulatory Visit (INDEPENDENT_AMBULATORY_CARE_PROVIDER_SITE_OTHER): Payer: Medicare Other | Admitting: Ophthalmology

## 2020-03-03 ENCOUNTER — Other Ambulatory Visit: Payer: Self-pay

## 2020-03-03 ENCOUNTER — Encounter (INDEPENDENT_AMBULATORY_CARE_PROVIDER_SITE_OTHER): Payer: Self-pay | Admitting: Ophthalmology

## 2020-03-03 DIAGNOSIS — H3581 Retinal edema: Secondary | ICD-10-CM

## 2020-03-03 DIAGNOSIS — H4311 Vitreous hemorrhage, right eye: Secondary | ICD-10-CM

## 2020-03-03 DIAGNOSIS — E113593 Type 2 diabetes mellitus with proliferative diabetic retinopathy without macular edema, bilateral: Secondary | ICD-10-CM | POA: Diagnosis not present

## 2020-03-03 DIAGNOSIS — I10 Essential (primary) hypertension: Secondary | ICD-10-CM

## 2020-03-03 DIAGNOSIS — H35033 Hypertensive retinopathy, bilateral: Secondary | ICD-10-CM

## 2020-03-03 MED ORDER — BEVACIZUMAB CHEMO INJECTION 1.25MG/0.05ML SYRINGE FOR KALEIDOSCOPE
1.2500 mg | INTRAVITREAL | Status: AC | PRN
Start: 2020-03-03 — End: 2020-03-03
  Administered 2020-03-03: 1.25 mg via INTRAVITREAL

## 2020-03-03 NOTE — Progress Notes (Signed)
[facility] Note  03/03/2020     CHIEF COMPLAINT Patient presents for Retina Follow Up   HISTORY OF PRESENT ILLNESS: Jonathan Mcneil is a 64 y.o. male who presents to the clinic today for:   HPI    Retina Follow Up    Patient presents with  Diabetic Retinopathy.  In both eyes.  Duration of 2 days.  Since onset it is stable.  I, the attending physician,  performed the HPI with the patient and updated documentation appropriately.          Comments    2 day follow up PDR OU- Vision stable OU. Patient does not check BS and does not take medications for diabetes.        Last edited by Bernarda Caffey, MD on 03/03/2020  8:46 AM. (History)    Here for IVA OS for PDR  Referring physician: Merrilee Seashore, MD [address],  [place] [id number]  HISTORICAL INFORMATION:   Selected notes from the MEDICAL RECORD NUMBER Referred by Dr. Maryjane Hurter for heme OD LEE:  Ocular Hx- PMH-    CURRENT MEDICATIONS: No current outpatient medications on file. (Ophthalmic Drugs)   No current facility-administered medications for this visit. (Ophthalmic Drugs)   Current Outpatient Medications (Other)  Medication Sig  . acetaminophen (TYLENOL) 650 MG CR tablet Take 1,300 mg by mouth 2 (two) times daily as needed for pain.  Marland Kitchen amLODipine (NORVASC) 10 MG tablet Take 10 mg by mouth at bedtime.   . Multiple Vitamins-Minerals (MULTIVITAMIN) LIQD Take 32 oz by mouth daily.  Marland Kitchen azithromycin (ZITHROMAX Z-PAK) 250 MG tablet Take 1 tablet (250 mg total) by mouth daily. (Patient not taking: Reported on 03/03/2020)  . benzonatate (TESSALON) 100 MG capsule Take 1 capsule (100 mg total) by mouth every 8 (eight) hours. (Patient not taking: Reported on 03/03/2020)  . calcitRIOL (ROCALTROL) 0.25 MCG capsule Take 1 capsule (0.25 mcg total) by mouth every Monday, Wednesday, and Friday with hemodialysis. (Patient not taking: No sig reported)  . pantoprazole  (PROTONIX) 20 MG tablet Take 1 tablet (20 mg total) by mouth daily. (Patient not taking: No sig reported)  . pantoprazole (PROTONIX) 40 MG tablet Take 40 mg by mouth daily. (Patient not taking: Reported on 03/03/2020)  . promethazine (PHENERGAN) 25 MG suppository Place 1 suppository (25 mg total) rectally every 8 (eight) hours as needed for nausea or vomiting. (Patient not taking: Reported on 03/03/2020)  . promethazine (PHENERGAN) 25 MG tablet Take 25 mg by mouth 3 (three) times daily. (Patient not taking: Reported on 03/03/2020)  . sucralfate (CARAFATE) 1 g tablet Take 1 tablet (1 g total) by mouth 4 (four) times daily -  with meals and at bedtime. (Patient not taking: Reported on 03/03/2020)  . VELPHORO 500 MG chewable tablet Chew 500 mg by mouth 3 (three) times daily. (Patient not taking: Reported on 03/03/2020)   No current facility-administered medications for this visit. (Other)      REVIEW OF SYSTEMS: ROS    Positive for: Neurological, Genitourinary, Endocrine, Eyes, Psychiatric   Negative for: Constitutional, Gastrointestinal, Skin, Musculoskeletal, HENT, Cardiovascular, Respiratory, Allergic/Imm, Heme/Lymph   Last edited by Leonie Douglas, COA on 03/03/2020  8:24 AM. (History)       ALLERGIES Allergies  Allergen Reactions  . Ibuprofen Other (See Comments)    Unknown reaction  . Penicillins Itching and Other (See Comments)    Has patient had a PCN reaction causing immediate rash, facial/tongue/throat  swelling, SOB or lightheadedness with hypotension: No Has patient had a PCN reaction causing severe rash involving mucus membranes or skin necrosis: No Has patient had a PCN reaction that required hospitalization No Has patient had a PCN reaction occurring within the last 10 years: Unknown If all of the above answers are "NO", then may proceed with Cephalosporin use.    PAST MEDICAL HISTORY Past Medical History:  Diagnosis Date  . Diabetes mellitus without complication ([place])    . ESRD (end stage renal disease) ([place])   . GERD (gastroesophageal reflux disease)    PMH  . Hypertension   . Low back pain   . Metabolic acidosis   . Neuromuscular disorder ([place])    peripheral neuropathy  . Pancreatitis   . Schizophrenia ([place])    does not take medications   Past Surgical History:  Procedure Laterality Date  . A/V FISTULAGRAM Left 06/03/2018   Procedure: A/V FISTULAGRAM;  Surgeon: Marty Heck, MD;  Location: [facility];  Service: Cardiovascular;  Laterality: Left;  . AV FISTULA PLACEMENT Left 02/11/2018   Procedure: INSERTION OF ARTERIOVENOUS (AV) FISTULA LEFT  ARM;  Surgeon: [name], MD;  Location: [facility];  Service: Vascular;  Laterality: Left;  . BASCILIC VEIN TRANSPOSITION Left 04/17/2018   Procedure: BASILIC VEIN TRANSPOSITION SECOND STAGE;  Surgeon: [name], MD;  Location: [facility];  Service: Vascular;  Laterality: Left;  . BIOPSY  10/20/2019   Procedure: BIOPSY;  Surgeon: Clarene Essex, MD;  Location: [facility];  Service: Endoscopy;;  . COLONOSCOPY    . DENTAL SURGERY    . ESOPHAGOGASTRODUODENOSCOPY (EGD) WITH PROPOFOL N/A 10/20/2019   Procedure: ESOPHAGOGASTRODUODENOSCOPY (EGD) WITH PROPOFOL;  Surgeon: Clarene Essex, MD;  Location: [facility];  Service: Endoscopy;  Laterality: N/A;  . INSERTION OF DIALYSIS CATHETER Right 02/25/2018   Procedure: INSERTION OF DIALYSIS CATHETER;  Surgeon: [name], MD;  Location: [facility];  Service: Vascular;  Laterality: Right;  . PERIPHERAL VASCULAR BALLOON ANGIOPLASTY  06/03/2018   Procedure: PERIPHERAL VASCULAR BALLOON ANGIOPLASTY;  Surgeon: Marty Heck, MD;  Location: [facility];  Service: Cardiovascular;;  left a/v fistula    FAMILY HISTORY Family History  Problem Relation Age of Onset  . Kidney failure Mother   . Diabetes Father   . Blindness Brother     SOCIAL HISTORY Social History   Tobacco Use  . Smoking status: Former Research scientist (life sciences)  .  Smokeless tobacco: Never Used  Vaping Use  . Vaping Use: Never used  Substance Use Topics  . Alcohol use: No  . Drug use: Not Currently    Types: Cocaine, Marijuana    Comment: smoked marijuana a few days ago; he denies using cocaine         OPHTHALMIC EXAM:  Base Eye Exam    Visual Acuity (Snellen - Linear)      Right Left   Dist [place] CF 3' 20/20   Dist ph [place] NI        Tonometry (Tonopen, 8:29 AM)      Right Left   Pressure 22 24  Pt squeezing        Pupils      Dark Light Shape React APD   Right 3 2 Round Minimal None   Left 3 2 Round Minimal None       Visual Fields (Counting fingers)      Left Right    Full Full       Extraocular Movement  Right Left    Full Full       Neuro/Psych    Oriented x3: Yes   Mood/Affect: Normal       Dilation    Both eyes: 1.0% Mydriacyl, 2.5% Phenylephrine @ 8:30 AM        Slit Lamp and Fundus Exam    Slit Lamp Exam      Right Left   Lids/Lashes Dermatochalasis - upper lid Dermatochalasis - upper lid   Conjunctiva/Sclera Melanosis Melanosis, temporal pinguecula   Cornea 1+ Punctate epithelial erosions, mild arcus 1+ Punctate epithelial erosions, mild arcus   Anterior Chamber Deep and quiet Deep and quiet   Iris Round and dilated, No NVI Round and dilated, No NVI   Lens 2-3+ Nuclear sclerosis, 2-3+ Cortical cataract 2-3+ Nuclear sclerosis, 2-3+ Cortical cataract   Vitreous Vitreous syneresis, +RBC, blood stained vitreous condensations, subhyaloid heme inferior and nasal to disc Vitreous syneresis, +RBC       Fundus Exam      Right Left   Disc Mild Pallor, Sharp rim Mild Pallor, Sharp rim   C/D Ratio 0.5 0.6   Macula hazy view, grossly flat, scattered Microaneurysms Flat, Blunted foveal reflex, RPE mottling and clumping, scattered Microaneurysms greatest temporal macula   Vessels attenuated, Tortuous, +NV nasal midzone attenuated, Tortuous, +NV nasal to disc   Periphery Attached, boat shaped subhyaloid heme  inferior and nasal to disc, 360 IRH/DBH    Attached, scattered IRH/DBH greatest nasal             IMAGING AND PROCEDURES  Imaging and Procedures for 03/03/2020  OCT, Retina - OU - Both Eyes       Right Eye Quality was poor. Progression has no prior data. Findings include abnormal foveal contour, no IRF, no SRF (+vitreous opacitites, subhyaloid hyper reflective material nasal to disc caught on widefield).   Left Eye Quality was good. Central Foveal Thickness: 241. Progression has been stable. Findings include normal foveal contour, no IRF, no SRF, intraretinal fluid (+IRF/cystic changes temporal macula; Partial PVD).   Notes *Images captured and stored on drive  Diagnosis / Impression:  *no image obtained OD today* Prior OD: +vitreous opacities, subhyaloid hyper reflective material nasal to disc caught on widefield OS: NFP, no SRF; +IRF/cystic changes temporal macula  Clinical management:  See below  Abbreviations: NFP - Normal foveal profile. CME - cystoid macular edema. PED - pigment epithelial detachment. IRF - intraretinal fluid. SRF - subretinal fluid. EZ - ellipsoid zone. ERM - epiretinal membrane. ORA - outer retinal atrophy. ORT - outer retinal tubulation. SRHM - subretinal hyper-reflective material. IRHM - intraretinal hyper-reflective material        Intravitreal Injection, Pharmacologic Agent - OS - Left Eye       Time Out 03/03/2020. 8:41 AM. Confirmed correct patient, procedure, site, and patient consented.   Anesthesia Topical anesthesia was used. Anesthetic medications included Lidocaine 2%, Proparacaine 0.5%.   Procedure Preparation included 5% betadine to ocular surface, eyelid speculum. A (32g) needle was used.   Injection:  1.25 mg Bevacizumab (AVASTIN) 1.25mg /0.51mL SOLN   NDC: H061816, Lot: [id number], Expiration date: 03/16/2020   Route: Intravitreal, Site: Left Eye, Waste: 0.05 mg  Post-op Post injection exam found visual acuity of at  least counting fingers. The patient tolerated the procedure well. There were no complications. The patient received written and verbal post procedure care education.                 ASSESSMENT/PLAN:    ICD-10-CM  1. 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 Retinal edema  H35.81 OCT, Retina - OU - Both Eyes  3. Vitreous hemorrhage, right eye ([place])  H43.11   4. Essential hypertension  I10   5. Hypertensive retinopathy of both eyes  H35.033     1,2. Proliferative diabetic retinopathy OU (OD > OS) - s/p IVA OD #1 (12.08.21) - exam shows preretinal/subhyaloid heme OD, OS with just scattered MA, IRH - FA today (12.08.21) shows late-leaking MA, vascular nonperfusion, +NV OU (nasal midzone) - OCT with mild cystic changes OS; diffuse atrophy/thinning OU  - discussed findings and prognosis - recommend IVA OS #1 today, 12.10.21 - pt wishes to proceed with injection - RBA of procedure discussed, questions answered - IVA informed consent obtained and signed, 12.08.21 (OU) - see procedure note - f/u 4 weeks, DFE, OCT  3. Vitreous Hemorrhage OD  - acute onset with violent coughing spell  - underlying etiology related to NV/PDR as described above  - VH precautions reviewed -- minimize activities, keep head elevated, avoid ASA/NSAIDs/blood thinners as able  4,5. Hypertensive retinopathy OU - discussed importance of tight BP control - monitor  6. Mixed Cataract OU - The symptoms of cataract, surgical options, and treatments and risks were discussed with patient. - discussed diagnosis and progression - not yet visually significant - monitor for now     Ophthalmic Meds Ordered this visit:  Meds ordered this encounter  Medications  . Bevacizumab (AVASTIN) SOLN 1.25 mg       Return for 2-3  wks- PDR OU- Dilated Exam, OCT.  There are no Patient Instructions on file for this visit.   Explained the diagnoses, plan, and follow up with the patient and they expressed understanding.  Patient expressed understanding of the importance of proper follow up care.   This document serves as a record of services personally performed by Gardiner Sleeper, MD, PhD. It was created on their behalf by San Jetty. Owens Shark, OA an ophthalmic technician. The creation of this record is the provider's dictation and/or activities during the visit.    Electronically signed by: San Jetty. Owens Shark, [place] 12.10.2021 8:54 AM   Gardiner Sleeper, M.D., Ph.D. Diseases & Surgery of the Retina and Vitreous [facility]  I have reviewed the above documentation for accuracy and completeness, and I agree with the above. Gardiner Sleeper, M.D., Ph.D. 03/03/20 8:54 AM   Abbreviations: M myopia (nearsighted); A astigmatism; H hyperopia (farsighted); P presbyopia; Mrx spectacle prescription;  CTL contact lenses; OD right eye; OS left eye; OU both eyes  XT exotropia; ET esotropia; PEK punctate epithelial keratitis; PEE punctate epithelial erosions; DES dry eye syndrome; MGD meibomian gland dysfunction; ATs artificial tears; PFAT's preservative free artificial tears; [place] nuclear sclerotic cataract; PSC posterior subcapsular cataract; ERM epi-retinal membrane; PVD posterior vitreous detachment; RD retinal detachment; DM diabetes mellitus; DR diabetic retinopathy; NPDR non-proliferative diabetic retinopathy; PDR proliferative diabetic retinopathy; CSME clinically significant macular edema; DME diabetic macular edema; dbh dot blot hemorrhages; CWS cotton wool spot; POAG primary open angle glaucoma; C/D cup-to-disc ratio; HVF humphrey visual field; GVF goldmann visual field; OCT optical coherence tomography; IOP intraocular pressure; BRVO Branch retinal vein occlusion; CRVO central retinal vein occlusion; CRAO central  retinal artery occlusion; BRAO branch retinal artery occlusion; RT retinal tear; SB scleral  buckle; PPV pars plana vitrectomy; VH Vitreous hemorrhage; PRP panretinal laser photocoagulation; IVK intravitreal kenalog; VMT vitreomacular traction; MH Macular hole;  NVD neovascularization of the disc; NVE neovascularization elsewhere; AREDS age related eye disease study; ARMD age related macular degeneration; POAG primary open angle glaucoma; EBMD epithelial/anterior basement membrane dystrophy; ACIOL anterior chamber intraocular lens; IOL intraocular lens; PCIOL posterior chamber intraocular lens; Phaco/IOL phacoemulsification with intraocular lens placement; [place] photorefractive keratectomy; LASIK laser assisted in situ keratomileusis; HTN hypertension; DM diabetes mellitus; COPD chronic obstructive pulmonary disease

## 2020-03-07 DIAGNOSIS — E1129 Type 2 diabetes mellitus with other diabetic kidney complication: Secondary | ICD-10-CM | POA: Diagnosis not present

## 2020-03-07 DIAGNOSIS — Z992 Dependence on renal dialysis: Secondary | ICD-10-CM | POA: Diagnosis not present

## 2020-03-07 DIAGNOSIS — L299 Pruritus, unspecified: Secondary | ICD-10-CM | POA: Diagnosis not present

## 2020-03-07 DIAGNOSIS — N2581 Secondary hyperparathyroidism of renal origin: Secondary | ICD-10-CM | POA: Diagnosis not present

## 2020-03-07 DIAGNOSIS — N186 End stage renal disease: Secondary | ICD-10-CM | POA: Diagnosis not present

## 2020-03-07 DIAGNOSIS — R52 Pain, unspecified: Secondary | ICD-10-CM | POA: Diagnosis not present

## 2020-03-07 DIAGNOSIS — D689 Coagulation defect, unspecified: Secondary | ICD-10-CM | POA: Diagnosis not present

## 2020-03-09 DIAGNOSIS — R52 Pain, unspecified: Secondary | ICD-10-CM | POA: Diagnosis not present

## 2020-03-09 DIAGNOSIS — L299 Pruritus, unspecified: Secondary | ICD-10-CM | POA: Diagnosis not present

## 2020-03-09 DIAGNOSIS — E1129 Type 2 diabetes mellitus with other diabetic kidney complication: Secondary | ICD-10-CM | POA: Diagnosis not present

## 2020-03-09 DIAGNOSIS — D689 Coagulation defect, unspecified: Secondary | ICD-10-CM | POA: Diagnosis not present

## 2020-03-09 DIAGNOSIS — N2581 Secondary hyperparathyroidism of renal origin: Secondary | ICD-10-CM | POA: Diagnosis not present

## 2020-03-09 DIAGNOSIS — Z992 Dependence on renal dialysis: Secondary | ICD-10-CM | POA: Diagnosis not present

## 2020-03-09 DIAGNOSIS — N186 End stage renal disease: Secondary | ICD-10-CM | POA: Diagnosis not present

## 2020-03-11 DIAGNOSIS — N2581 Secondary hyperparathyroidism of renal origin: Secondary | ICD-10-CM | POA: Diagnosis not present

## 2020-03-11 DIAGNOSIS — R52 Pain, unspecified: Secondary | ICD-10-CM | POA: Diagnosis not present

## 2020-03-11 DIAGNOSIS — L299 Pruritus, unspecified: Secondary | ICD-10-CM | POA: Diagnosis not present

## 2020-03-11 DIAGNOSIS — E1129 Type 2 diabetes mellitus with other diabetic kidney complication: Secondary | ICD-10-CM | POA: Diagnosis not present

## 2020-03-11 DIAGNOSIS — Z992 Dependence on renal dialysis: Secondary | ICD-10-CM | POA: Diagnosis not present

## 2020-03-11 DIAGNOSIS — N186 End stage renal disease: Secondary | ICD-10-CM | POA: Diagnosis not present

## 2020-03-11 DIAGNOSIS — D689 Coagulation defect, unspecified: Secondary | ICD-10-CM | POA: Diagnosis not present

## 2020-03-14 DIAGNOSIS — R52 Pain, unspecified: Secondary | ICD-10-CM | POA: Diagnosis not present

## 2020-03-14 DIAGNOSIS — N186 End stage renal disease: Secondary | ICD-10-CM | POA: Diagnosis not present

## 2020-03-14 DIAGNOSIS — E1129 Type 2 diabetes mellitus with other diabetic kidney complication: Secondary | ICD-10-CM | POA: Diagnosis not present

## 2020-03-14 DIAGNOSIS — D689 Coagulation defect, unspecified: Secondary | ICD-10-CM | POA: Diagnosis not present

## 2020-03-14 DIAGNOSIS — Z992 Dependence on renal dialysis: Secondary | ICD-10-CM | POA: Diagnosis not present

## 2020-03-14 DIAGNOSIS — L299 Pruritus, unspecified: Secondary | ICD-10-CM | POA: Diagnosis not present

## 2020-03-14 DIAGNOSIS — N2581 Secondary hyperparathyroidism of renal origin: Secondary | ICD-10-CM | POA: Diagnosis not present

## 2020-03-16 DIAGNOSIS — Z992 Dependence on renal dialysis: Secondary | ICD-10-CM | POA: Diagnosis not present

## 2020-03-16 DIAGNOSIS — R52 Pain, unspecified: Secondary | ICD-10-CM | POA: Diagnosis not present

## 2020-03-16 DIAGNOSIS — D689 Coagulation defect, unspecified: Secondary | ICD-10-CM | POA: Diagnosis not present

## 2020-03-16 DIAGNOSIS — L299 Pruritus, unspecified: Secondary | ICD-10-CM | POA: Diagnosis not present

## 2020-03-16 DIAGNOSIS — E1129 Type 2 diabetes mellitus with other diabetic kidney complication: Secondary | ICD-10-CM | POA: Diagnosis not present

## 2020-03-16 DIAGNOSIS — N2581 Secondary hyperparathyroidism of renal origin: Secondary | ICD-10-CM | POA: Diagnosis not present

## 2020-03-16 DIAGNOSIS — N186 End stage renal disease: Secondary | ICD-10-CM | POA: Diagnosis not present

## 2020-03-16 NOTE — Progress Notes (Signed)
[facility] Note  03/24/2020     CHIEF COMPLAINT Patient presents for Retina Follow Up   HISTORY OF PRESENT ILLNESS: Jonathan Mcneil is a 64 y.o. male who presents to the clinic today for:   HPI    Retina Follow Up    Patient presents with  Diabetic Retinopathy.  In both eyes.  Severity is mild.  Duration of 3 weeks.  Since onset it is rapidly improving.  I, the attending physician,  performed the HPI with the patient and updated documentation appropriately.          Comments    3 week  Retina follow up for PDR ou. Patients states vision has improved since last visit. Patient Blood sugar?       Last edited by Bernarda Caffey, MD on 03/25/2020 12:39 PM. (History)    pt states right eye vision has improved, pt has started going back to dialysis  Referring physician: Merrilee Seashore, MD [id number],  [place] [address]  HISTORICAL INFORMATION:   Selected notes from the MEDICAL RECORD NUMBER Referred by Dr. Maryjane Hurter for heme OD LEE:  Ocular Hx- PMH-    CURRENT MEDICATIONS: No current outpatient medications on file. (Ophthalmic Drugs)   No current facility-administered medications for this visit. (Ophthalmic Drugs)   Current Outpatient Medications (Other)  Medication Sig  . acetaminophen (TYLENOL) 650 MG CR tablet Take 1,300 mg by mouth 2 (two) times daily as needed for pain.  Marland Kitchen amLODipine (NORVASC) 10 MG tablet Take 10 mg by mouth at bedtime.   . Multiple Vitamins-Minerals (MULTIVITAMIN) LIQD Take 32 oz by mouth daily.  Marland Kitchen azithromycin (ZITHROMAX Z-PAK) 250 MG tablet Take 1 tablet (250 mg total) by mouth daily. (Patient not taking: No sig reported)  . benzonatate (TESSALON) 100 MG capsule Take 1 capsule (100 mg total) by mouth every 8 (eight) hours. (Patient not taking: No sig reported)  . calcitRIOL (ROCALTROL) 0.25 MCG capsule Take 1 capsule (0.25 mcg total) by mouth every Monday, Wednesday, and Friday with  hemodialysis. (Patient not taking: No sig reported)  . pantoprazole (PROTONIX) 20 MG tablet Take 1 tablet (20 mg total) by mouth daily. (Patient not taking: No sig reported)  . pantoprazole (PROTONIX) 40 MG tablet Take 40 mg by mouth daily. (Patient not taking: No sig reported)  . promethazine (PHENERGAN) 25 MG suppository Place 1 suppository (25 mg total) rectally every 8 (eight) hours as needed for nausea or vomiting. (Patient not taking: No sig reported)  . promethazine (PHENERGAN) 25 MG tablet Take 25 mg by mouth 3 (three) times daily. (Patient not taking: No sig reported)  . sucralfate (CARAFATE) 1 g tablet Take 1 tablet (1 g total) by mouth 4 (four) times daily -  with meals and at bedtime. (Patient not taking: No sig reported)  . VELPHORO 500 MG chewable tablet Chew 500 mg by mouth 3 (three) times daily. (Patient not taking: No sig reported)   No current facility-administered medications for this visit. (Other)      REVIEW OF SYSTEMS: ROS    Positive for: Neurological, Genitourinary, Endocrine, Eyes, Psychiatric   Negative for: Constitutional, Gastrointestinal, Skin, Musculoskeletal, HENT, Cardiovascular, Respiratory, Allergic/Imm, Heme/Lymph   Last edited by Elmore Guise, COT on 03/24/2020  8:44 AM. (History)       ALLERGIES Allergies  Allergen Reactions  . Ibuprofen Other (See Comments)    Unknown reaction  . Penicillins Itching and Other (See Comments)  Has patient had a PCN reaction causing immediate rash, facial/tongue/throat swelling, SOB or lightheadedness with hypotension: No Has patient had a PCN reaction causing severe rash involving mucus membranes or skin necrosis: No Has patient had a PCN reaction that required hospitalization No Has patient had a PCN reaction occurring within the last 10 years: Unknown If all of the above answers are "NO", then may proceed with Cephalosporin use.    PAST MEDICAL HISTORY Past Medical History:  Diagnosis Date  . Diabetes  mellitus without complication ([place])   . ESRD (end stage renal disease) ([place])   . GERD (gastroesophageal reflux disease)    PMH  . Hypertension   . Low back pain   . Metabolic acidosis   . Neuromuscular disorder ([place])    peripheral neuropathy  . Pancreatitis   . Schizophrenia ([place])    does not take medications   Past Surgical History:  Procedure Laterality Date  . A/V FISTULAGRAM Left 06/03/2018   Procedure: A/V FISTULAGRAM;  Surgeon: Marty Heck, MD;  Location: [facility];  Service: Cardiovascular;  Laterality: Left;  . AV FISTULA PLACEMENT Left 02/11/2018   Procedure: INSERTION OF ARTERIOVENOUS (AV) FISTULA LEFT  ARM;  Surgeon: [name], MD;  Location: [facility];  Service: Vascular;  Laterality: Left;  . BASCILIC VEIN TRANSPOSITION Left 04/17/2018   Procedure: BASILIC VEIN TRANSPOSITION SECOND STAGE;  Surgeon: [name], MD;  Location: [facility];  Service: Vascular;  Laterality: Left;  . BIOPSY  10/20/2019   Procedure: BIOPSY;  Surgeon: Clarene Essex, MD;  Location: [facility];  Service: Endoscopy;;  . COLONOSCOPY    . DENTAL SURGERY    . ESOPHAGOGASTRODUODENOSCOPY (EGD) WITH PROPOFOL N/A 10/20/2019   Procedure: ESOPHAGOGASTRODUODENOSCOPY (EGD) WITH PROPOFOL;  Surgeon: Clarene Essex, MD;  Location: [facility];  Service: Endoscopy;  Laterality: N/A;  . INSERTION OF DIALYSIS CATHETER Right 02/25/2018   Procedure: INSERTION OF DIALYSIS CATHETER;  Surgeon: [name], MD;  Location: [facility];  Service: Vascular;  Laterality: Right;  . PERIPHERAL VASCULAR BALLOON ANGIOPLASTY  06/03/2018   Procedure: PERIPHERAL VASCULAR BALLOON ANGIOPLASTY;  Surgeon: Marty Heck, MD;  Location: [facility];  Service: Cardiovascular;;  left a/v fistula    FAMILY HISTORY Family History  Problem Relation Age of Onset  . Kidney failure Mother   . Diabetes Father   . Blindness Brother     SOCIAL HISTORY Social History   Tobacco Use  .  Smoking status: Former Research scientist (life sciences)  . Smokeless tobacco: Never Used  Vaping Use  . Vaping Use: Never used  Substance Use Topics  . Alcohol use: No  . Drug use: Not Currently    Types: Cocaine, Marijuana    Comment: smoked marijuana a few days ago; he denies using cocaine         OPHTHALMIC EXAM:  Base Eye Exam    Visual Acuity (Snellen - Linear)      Right Left   Dist [place] 20/25-3 20/20-3   Dist ph [place] NI NI       Tonometry (Tonopen, 8:47 AM)      Right Left   Pressure 18 19       Pupils      Dark Light Shape React APD   Right 3 2 Round Brisk None   Left 3 2 Round Brisk None       Visual Fields (Counting fingers)      Left Right    Full Full  Extraocular Movement      Right Left    Full, Ortho Full, Ortho       Neuro/Psych    Oriented x3: Yes   Mood/Affect: Normal       Dilation    Both eyes: 1.0% Mydriacyl, 2.5% Phenylephrine @ 8:47 AM        Slit Lamp and Fundus Exam    Slit Lamp Exam      Right Left   Lids/Lashes Dermatochalasis - upper lid Dermatochalasis - upper lid   Conjunctiva/Sclera Melanosis Melanosis, temporal pinguecula   Cornea 1+ Punctate epithelial erosions, mild arcus 1+ Punctate epithelial erosions, mild arcus   Anterior Chamber Deep and quiet Deep and quiet   Iris Round and dilated, No NVI Round and dilated, No NVI   Lens 2-3+ Nuclear sclerosis, 2-3+ Cortical cataract 2-3+ Nuclear sclerosis, 2-3+ Cortical cataract   Vitreous Vitreous syneresis, +RBC, blood stained vitreous condensations turning white, clearing centrally and settling inferiorly, subhyaloid heme inferior and nasal to disc improving Vitreous syneresis, +RBC       Fundus Exam      Right Left   Disc Mild Pallor, Sharp rim Mild Pallor, Sharp rim   C/D Ratio 0.5 0.4   Macula hazy view, grossly flat, scattered Microaneurysms Flat, Blunted foveal reflex, RPE mottling and clumping, scattered Microaneurysms greatest temporal macula - slightly improved   Vessels attenuated,  Tortuous, +NV nasal midzone attenuated, Tortuous, +NV nasal to disc   Periphery Attached, boat shaped subhyaloid heme inferior and nasal to disc improving, 360 IRH/DBH    Attached, scattered IRH/DBH greatest nasal             IMAGING AND PROCEDURES  Imaging and Procedures for 03/24/2020  OCT, Retina - OU - Both Eyes       Right Eye Quality was borderline. Central Foveal Thickness: 262. Progression has improved. Findings include abnormal foveal contour, no IRF, no SRF (Significant improvement in vitreous opacitites, trace cystic changes).   Left Eye Quality was good. Central Foveal Thickness: 241. Progression has been stable. Findings include normal foveal contour, no SRF, intraretinal fluid (+IRF/cystic changes temporal macula; Partial PVD).   Notes *Images captured and stored on drive  Diagnosis / Impression:  OD: Significant improvement in vitreous opacitites, trace cystic changes OS: NFP, no SRF; +IRF/cystic changes temporal macula  Clinical management:  See below  Abbreviations: NFP - Normal foveal profile. CME - cystoid macular edema. PED - pigment epithelial detachment. IRF - intraretinal fluid. SRF - subretinal fluid. EZ - ellipsoid zone. ERM - epiretinal membrane. ORA - outer retinal atrophy. ORT - outer retinal tubulation. SRHM - subretinal hyper-reflective material. IRHM - intraretinal hyper-reflective material                 ASSESSMENT/PLAN:    ICD-10-CM   1. Proliferative diabetic retinopathy of both eyes with macular edema associated with type 2 diabetes mellitus ([place])  M01.0272   2. Retinal edema  H35.81 OCT, Retina - OU - Both Eyes  3. Vitreous hemorrhage, right eye ([place])  H43.11   4. Essential hypertension  I10   5. Hypertensive retinopathy of both eyes  H35.033   6. Combined forms of age-related cataract of both eyes  H25.813     1,2. Proliferative diabetic retinopathy OU (OD > OS) - s/p IVA OD #1 (12.08.21) - s/p IVA OS #1 (12.10.21) - exam  shows preretinal/subhyaloid heme OD, OS with just scattered MA, IRH - FA (12.08.21) shows late-leaking MA, vascular nonperfusion, +NV OU (nasal midzone) -  OCT with mild cystic changes OS; diffuse atrophy/thinning OU - f/u January 7 or later, DFE, OCT  3. Vitreous Hemorrhage OD -- improving  - acute onset with violent coughing spell  - underlying etiology related to NV/PDR as described above, but contributed by use of heparin for dialysis  - VH clearing  - VH precautions reviewed -- minimize activities, keep head elevated, avoid ASA/NSAIDs/blood thinners as able  4,5. Hypertensive retinopathy OU - discussed importance of tight BP control - monitor  6. Mixed Cataract OU - The symptoms of cataract, surgical options, and treatments and risks were discussed with patient. - discussed diagnosis and progression - not yet visually significant - monitor for now     Ophthalmic Meds Ordered this visit:  No orders of the defined types were placed in this encounter.      Return for f/u January 7 or later, PDR OU, DFE, OCT.  There are no Patient Instructions on file for this visit.   Explained the diagnoses, plan, and follow up with the patient and they expressed understanding.  Patient expressed understanding of the importance of proper follow up care.   This document serves as a record of services personally performed by Gardiner Sleeper, MD, PhD. It was created on their behalf by San Jetty. Owens Shark, OA an ophthalmic technician. The creation of this record is the provider's dictation and/or activities during the visit.    Electronically signed by: San Jetty. Marguerita Merles 12.23.2021 12:44 PM   Gardiner Sleeper, M.D., Ph.D. Diseases & Surgery of the Retina and Vitreous [facility]  I have reviewed the above documentation for accuracy and completeness, and I agree with the above. Gardiner Sleeper, M.D., Ph.D. 03/25/20 12:44 PM   Abbreviations: M myopia (nearsighted); A  astigmatism; H hyperopia (farsighted); P presbyopia; Mrx spectacle prescription;  CTL contact lenses; OD right eye; OS left eye; OU both eyes  XT exotropia; ET esotropia; PEK punctate epithelial keratitis; PEE punctate epithelial erosions; DES dry eye syndrome; MGD meibomian gland dysfunction; ATs artificial tears; PFAT's preservative free artificial tears; [place] nuclear sclerotic cataract; PSC posterior subcapsular cataract; ERM epi-retinal membrane; PVD posterior vitreous detachment; RD retinal detachment; DM diabetes mellitus; DR diabetic retinopathy; NPDR non-proliferative diabetic retinopathy; PDR proliferative diabetic retinopathy; CSME clinically significant macular edema; DME diabetic macular edema; dbh dot blot hemorrhages; CWS cotton wool spot; POAG primary open angle glaucoma; C/D cup-to-disc ratio; HVF humphrey visual field; GVF goldmann visual field; OCT optical coherence tomography; IOP intraocular pressure; BRVO Branch retinal vein occlusion; CRVO central retinal vein occlusion; CRAO central retinal artery occlusion; BRAO branch retinal artery occlusion; RT retinal tear; SB scleral buckle; PPV pars plana vitrectomy; VH Vitreous hemorrhage; PRP panretinal laser photocoagulation; IVK intravitreal kenalog; VMT vitreomacular traction; MH Macular hole;  NVD neovascularization of the disc; NVE neovascularization elsewhere; AREDS age related eye disease study; ARMD age related macular degeneration; POAG primary open angle glaucoma; EBMD epithelial/anterior basement membrane dystrophy; ACIOL anterior chamber intraocular lens; IOL intraocular lens; PCIOL posterior chamber intraocular lens; Phaco/IOL phacoemulsification with intraocular lens placement; [place] photorefractive keratectomy; LASIK laser assisted in situ keratomileusis; HTN hypertension; DM diabetes mellitus; COPD chronic obstructive pulmonary disease

## 2020-03-19 DIAGNOSIS — L299 Pruritus, unspecified: Secondary | ICD-10-CM | POA: Diagnosis not present

## 2020-03-19 DIAGNOSIS — D689 Coagulation defect, unspecified: Secondary | ICD-10-CM | POA: Diagnosis not present

## 2020-03-19 DIAGNOSIS — R52 Pain, unspecified: Secondary | ICD-10-CM | POA: Diagnosis not present

## 2020-03-19 DIAGNOSIS — N2581 Secondary hyperparathyroidism of renal origin: Secondary | ICD-10-CM | POA: Diagnosis not present

## 2020-03-19 DIAGNOSIS — E1129 Type 2 diabetes mellitus with other diabetic kidney complication: Secondary | ICD-10-CM | POA: Diagnosis not present

## 2020-03-19 DIAGNOSIS — Z992 Dependence on renal dialysis: Secondary | ICD-10-CM | POA: Diagnosis not present

## 2020-03-19 DIAGNOSIS — N186 End stage renal disease: Secondary | ICD-10-CM | POA: Diagnosis not present

## 2020-03-21 DIAGNOSIS — N186 End stage renal disease: Secondary | ICD-10-CM | POA: Diagnosis not present

## 2020-03-21 DIAGNOSIS — Z992 Dependence on renal dialysis: Secondary | ICD-10-CM | POA: Diagnosis not present

## 2020-03-21 DIAGNOSIS — R52 Pain, unspecified: Secondary | ICD-10-CM | POA: Diagnosis not present

## 2020-03-21 DIAGNOSIS — E1129 Type 2 diabetes mellitus with other diabetic kidney complication: Secondary | ICD-10-CM | POA: Diagnosis not present

## 2020-03-21 DIAGNOSIS — L299 Pruritus, unspecified: Secondary | ICD-10-CM | POA: Diagnosis not present

## 2020-03-21 DIAGNOSIS — N2581 Secondary hyperparathyroidism of renal origin: Secondary | ICD-10-CM | POA: Diagnosis not present

## 2020-03-21 DIAGNOSIS — D689 Coagulation defect, unspecified: Secondary | ICD-10-CM | POA: Diagnosis not present

## 2020-03-23 DIAGNOSIS — N186 End stage renal disease: Secondary | ICD-10-CM | POA: Diagnosis not present

## 2020-03-23 DIAGNOSIS — R52 Pain, unspecified: Secondary | ICD-10-CM | POA: Diagnosis not present

## 2020-03-23 DIAGNOSIS — Z992 Dependence on renal dialysis: Secondary | ICD-10-CM | POA: Diagnosis not present

## 2020-03-23 DIAGNOSIS — N2581 Secondary hyperparathyroidism of renal origin: Secondary | ICD-10-CM | POA: Diagnosis not present

## 2020-03-23 DIAGNOSIS — E1129 Type 2 diabetes mellitus with other diabetic kidney complication: Secondary | ICD-10-CM | POA: Diagnosis not present

## 2020-03-23 DIAGNOSIS — D689 Coagulation defect, unspecified: Secondary | ICD-10-CM | POA: Diagnosis not present

## 2020-03-23 DIAGNOSIS — L299 Pruritus, unspecified: Secondary | ICD-10-CM | POA: Diagnosis not present

## 2020-03-24 ENCOUNTER — Other Ambulatory Visit: Payer: Self-pay

## 2020-03-24 ENCOUNTER — Ambulatory Visit (INDEPENDENT_AMBULATORY_CARE_PROVIDER_SITE_OTHER): Payer: Medicare Other | Admitting: Ophthalmology

## 2020-03-24 ENCOUNTER — Encounter (INDEPENDENT_AMBULATORY_CARE_PROVIDER_SITE_OTHER): Payer: Self-pay | Admitting: Ophthalmology

## 2020-03-24 DIAGNOSIS — E1122 Type 2 diabetes mellitus with diabetic chronic kidney disease: Secondary | ICD-10-CM | POA: Diagnosis not present

## 2020-03-24 DIAGNOSIS — H4311 Vitreous hemorrhage, right eye: Secondary | ICD-10-CM | POA: Diagnosis not present

## 2020-03-24 DIAGNOSIS — H3581 Retinal edema: Secondary | ICD-10-CM

## 2020-03-24 DIAGNOSIS — E113513 Type 2 diabetes mellitus with proliferative diabetic retinopathy with macular edema, bilateral: Secondary | ICD-10-CM

## 2020-03-24 DIAGNOSIS — H35033 Hypertensive retinopathy, bilateral: Secondary | ICD-10-CM | POA: Diagnosis not present

## 2020-03-24 DIAGNOSIS — H25813 Combined forms of age-related cataract, bilateral: Secondary | ICD-10-CM

## 2020-03-24 DIAGNOSIS — Z992 Dependence on renal dialysis: Secondary | ICD-10-CM | POA: Diagnosis not present

## 2020-03-24 DIAGNOSIS — I10 Essential (primary) hypertension: Secondary | ICD-10-CM

## 2020-03-24 DIAGNOSIS — N186 End stage renal disease: Secondary | ICD-10-CM | POA: Diagnosis not present

## 2020-03-25 ENCOUNTER — Encounter (INDEPENDENT_AMBULATORY_CARE_PROVIDER_SITE_OTHER): Payer: Self-pay | Admitting: Ophthalmology

## 2020-03-26 DIAGNOSIS — Z992 Dependence on renal dialysis: Secondary | ICD-10-CM | POA: Diagnosis not present

## 2020-03-26 DIAGNOSIS — D689 Coagulation defect, unspecified: Secondary | ICD-10-CM | POA: Diagnosis not present

## 2020-03-26 DIAGNOSIS — R52 Pain, unspecified: Secondary | ICD-10-CM | POA: Diagnosis not present

## 2020-03-26 DIAGNOSIS — D509 Iron deficiency anemia, unspecified: Secondary | ICD-10-CM | POA: Diagnosis not present

## 2020-03-26 DIAGNOSIS — N2581 Secondary hyperparathyroidism of renal origin: Secondary | ICD-10-CM | POA: Diagnosis not present

## 2020-03-26 DIAGNOSIS — N186 End stage renal disease: Secondary | ICD-10-CM | POA: Diagnosis not present

## 2020-03-26 DIAGNOSIS — E1129 Type 2 diabetes mellitus with other diabetic kidney complication: Secondary | ICD-10-CM | POA: Diagnosis not present

## 2020-03-30 DIAGNOSIS — N186 End stage renal disease: Secondary | ICD-10-CM | POA: Diagnosis not present

## 2020-03-30 DIAGNOSIS — R52 Pain, unspecified: Secondary | ICD-10-CM | POA: Diagnosis not present

## 2020-03-30 DIAGNOSIS — N2581 Secondary hyperparathyroidism of renal origin: Secondary | ICD-10-CM | POA: Diagnosis not present

## 2020-03-30 DIAGNOSIS — E1129 Type 2 diabetes mellitus with other diabetic kidney complication: Secondary | ICD-10-CM | POA: Diagnosis not present

## 2020-03-30 DIAGNOSIS — D509 Iron deficiency anemia, unspecified: Secondary | ICD-10-CM | POA: Diagnosis not present

## 2020-03-30 DIAGNOSIS — D689 Coagulation defect, unspecified: Secondary | ICD-10-CM | POA: Diagnosis not present

## 2020-03-30 DIAGNOSIS — Z992 Dependence on renal dialysis: Secondary | ICD-10-CM | POA: Diagnosis not present

## 2020-03-30 NOTE — Progress Notes (Deleted)
[facility] Note  04/04/2020     CHIEF COMPLAINT Patient presents for No chief complaint on file.   HISTORY OF PRESENT ILLNESS: Jonathan Mcneil is a 65 y.o. male who presents to the clinic today for:     Referring physician: Merrilee Seashore, [place],  [place] [address]  HISTORICAL INFORMATION:   Selected notes from the MEDICAL RECORD NUMBER Referred by Dr. Maryjane Hurter for heme OD   CURRENT MEDICATIONS: No current outpatient medications on file. (Ophthalmic Drugs)   No current facility-administered medications for this visit. (Ophthalmic Drugs)   Current Outpatient Medications (Other)  Medication Sig  . acetaminophen (TYLENOL) 650 MG CR tablet Take 1,300 mg by mouth 2 (two) times daily as needed for pain.  Marland Kitchen amLODipine (NORVASC) 10 MG tablet Take 10 mg by mouth at bedtime.   Marland Kitchen azithromycin (ZITHROMAX Z-PAK) 250 MG tablet Take 1 tablet (250 mg total) by mouth daily. (Patient not taking: No sig reported)  . benzonatate (TESSALON) 100 MG capsule Take 1 capsule (100 mg total) by mouth every 8 (eight) hours. (Patient not taking: No sig reported)  . calcitRIOL (ROCALTROL) 0.25 MCG capsule Take 1 capsule (0.25 mcg total) by mouth every Monday, Wednesday, and Friday with hemodialysis. (Patient not taking: No sig reported)  . Multiple Vitamins-Minerals (MULTIVITAMIN) LIQD Take 32 oz by mouth daily.  . pantoprazole (PROTONIX) 20 MG tablet Take 1 tablet (20 mg total) by mouth daily. (Patient not taking: No sig reported)  . pantoprazole (PROTONIX) 40 MG tablet Take 40 mg by mouth daily. (Patient not taking: No sig reported)  . promethazine (PHENERGAN) 25 MG suppository Place 1 suppository (25 mg total) rectally every 8 (eight) hours as needed for nausea or vomiting. (Patient not taking: No sig reported)  . promethazine (PHENERGAN) 25 MG tablet Take 25 mg by mouth 3 (three) times daily. (Patient not taking: No sig reported)  .  sucralfate (CARAFATE) 1 g tablet Take 1 tablet (1 g total) by mouth 4 (four) times daily -  with meals and at bedtime. (Patient not taking: No sig reported)  . VELPHORO 500 MG chewable tablet Chew 500 mg by mouth 3 (three) times daily. (Patient not taking: No sig reported)   No current facility-administered medications for this visit. (Other)      REVIEW OF SYSTEMS:    ALLERGIES Allergies  Allergen Reactions  . Ibuprofen Other (See Comments)    Unknown reaction  . Penicillins Itching and Other (See Comments)    Has patient had a PCN reaction causing immediate rash, facial/tongue/throat swelling, SOB or lightheadedness with hypotension: No Has patient had a PCN reaction causing severe rash involving mucus membranes or skin necrosis: No Has patient had a PCN reaction that required hospitalization No Has patient had a PCN reaction occurring within the last 10 years: Unknown If all of the above answers are "NO", then may proceed with Cephalosporin use.    PAST MEDICAL HISTORY Past Medical History:  Diagnosis Date  . Diabetes mellitus without complication ([place])   . ESRD (end stage renal disease) ([place])   . GERD (gastroesophageal reflux disease)    PMH  . Hypertension   . Low back pain   . Metabolic acidosis   . Neuromuscular disorder ([place])    peripheral neuropathy  . Pancreatitis   . Schizophrenia ([place])    does not take medications   Past Surgical History:  Procedure Laterality Date  . A/V FISTULAGRAM Left  06/03/2018   Procedure: A/V FISTULAGRAM;  Surgeon: Marty Heck, MD;  Location: [facility];  Service: Cardiovascular;  Laterality: Left;  . AV FISTULA PLACEMENT Left 02/11/2018   Procedure: INSERTION OF ARTERIOVENOUS (AV) FISTULA LEFT  ARM;  Surgeon: [name], MD;  Location: [facility];  Service: Vascular;  Laterality: Left;  . BASCILIC VEIN TRANSPOSITION Left 04/17/2018   Procedure: BASILIC VEIN TRANSPOSITION SECOND STAGE;  Surgeon: [name], MD;  Location: [facility];  Service: Vascular;  Laterality: Left;  . BIOPSY  10/20/2019   Procedure: BIOPSY;  Surgeon: Clarene Essex, MD;  Location: [facility];  Service: Endoscopy;;  . COLONOSCOPY    . DENTAL SURGERY    . ESOPHAGOGASTRODUODENOSCOPY (EGD) WITH PROPOFOL N/A 10/20/2019   Procedure: ESOPHAGOGASTRODUODENOSCOPY (EGD) WITH PROPOFOL;  Surgeon: Clarene Essex, MD;  Location: [facility];  Service: Endoscopy;  Laterality: N/A;  . INSERTION OF DIALYSIS CATHETER Right 02/25/2018   Procedure: INSERTION OF DIALYSIS CATHETER;  Surgeon: [name], MD;  Location: [facility];  Service: Vascular;  Laterality: Right;  . PERIPHERAL VASCULAR BALLOON ANGIOPLASTY  06/03/2018   Procedure: PERIPHERAL VASCULAR BALLOON ANGIOPLASTY;  Surgeon: Marty Heck, MD;  Location: [facility];  Service: Cardiovascular;;  left a/v fistula    FAMILY HISTORY Family History  Problem Relation Age of Onset  . Kidney failure Mother   . Diabetes Father   . Blindness Brother     SOCIAL HISTORY Social History   Tobacco Use  . Smoking status: Former Research scientist (life sciences)  . Smokeless tobacco: Never Used  Vaping Use  . Vaping Use: Never used  Substance Use Topics  . Alcohol use: No  . Drug use: Not Currently    Types: Cocaine, Marijuana    Comment: smoked marijuana a few days ago; he denies using cocaine         OPHTHALMIC EXAM:  Not recorded     IMAGING AND PROCEDURES  Imaging and Procedures for 04/04/2020           ASSESSMENT/PLAN:  No diagnosis found.  1,2. Proliferative diabetic retinopathy OU (OD > OS) - s/p IVA OD #1 (12.08.21) - s/p IVA OS #1 (12.10.21) - exam shows preretinal/subhyaloid heme OD, OS with just scattered MA, IRH - FA (12.08.21) shows late-leaking MA, vascular nonperfusion, +NV OU (nasal midzone) - OCT with mild cystic changes OS; diffuse atrophy/thinning OU - f/u January 7 or later, DFE, OCT  3. Vitreous Hemorrhage OD -- improving  - acute onset  with violent coughing spell  - underlying etiology related to NV/PDR as described above, but contributed by use of heparin for dialysis  - VH clearing  - VH precautions reviewed -- minimize activities, keep head elevated, avoid ASA/NSAIDs/blood thinners as able  4,5. Hypertensive retinopathy OU - discussed importance of tight BP control - monitor  6. Mixed Cataract OU - The symptoms of cataract, surgical options, and treatments and risks were discussed with patient. - discussed diagnosis and progression - not yet visually significant - monitor for now  Ophthalmic Meds Ordered this visit:  No orders of the defined types were placed in this encounter.     No follow-ups on file.  There are no Patient Instructions on file for this visit.  Explained the diagnoses, plan, and follow up with the patient and they expressed understanding.  Patient expressed understanding of the importance of proper follow up care.     Abbreviations: M myopia (nearsighted); A astigmatism; H hyperopia (farsighted); P presbyopia; Mrx spectacle  prescription;  CTL contact lenses; OD right eye; OS left eye; OU both eyes  XT exotropia; ET esotropia; PEK punctate epithelial keratitis; PEE punctate epithelial erosions; DES dry eye syndrome; MGD meibomian gland dysfunction; ATs artificial tears; PFAT's preservative free artificial tears; [place] nuclear sclerotic cataract; PSC posterior subcapsular cataract; ERM epi-retinal membrane; PVD posterior vitreous detachment; RD retinal detachment; DM diabetes mellitus; DR diabetic retinopathy; NPDR non-proliferative diabetic retinopathy; PDR proliferative diabetic retinopathy; CSME clinically significant macular edema; DME diabetic macular edema; dbh dot blot hemorrhages; CWS cotton wool spot; POAG primary open angle glaucoma; C/D cup-to-disc ratio; HVF humphrey visual field; GVF goldmann visual field; OCT optical coherence tomography; IOP intraocular pressure; BRVO Branch retinal  vein occlusion; CRVO central retinal vein occlusion; CRAO central retinal artery occlusion; BRAO branch retinal artery occlusion; RT retinal tear; SB scleral buckle; PPV pars plana vitrectomy; VH Vitreous hemorrhage; PRP panretinal laser photocoagulation; IVK intravitreal kenalog; VMT vitreomacular traction; MH Macular hole;  NVD neovascularization of the disc; NVE neovascularization elsewhere; AREDS age related eye disease study; ARMD age related macular degeneration; POAG primary open angle glaucoma; EBMD epithelial/anterior basement membrane dystrophy; ACIOL anterior chamber intraocular lens; IOL intraocular lens; PCIOL posterior chamber intraocular lens; Phaco/IOL phacoemulsification with intraocular lens placement; [place] photorefractive keratectomy; LASIK laser assisted in situ keratomileusis; HTN hypertension; DM diabetes mellitus; COPD chronic obstructive pulmonary disease

## 2020-04-01 DIAGNOSIS — N186 End stage renal disease: Secondary | ICD-10-CM | POA: Diagnosis not present

## 2020-04-01 DIAGNOSIS — D509 Iron deficiency anemia, unspecified: Secondary | ICD-10-CM | POA: Diagnosis not present

## 2020-04-01 DIAGNOSIS — Z992 Dependence on renal dialysis: Secondary | ICD-10-CM | POA: Diagnosis not present

## 2020-04-01 DIAGNOSIS — D689 Coagulation defect, unspecified: Secondary | ICD-10-CM | POA: Diagnosis not present

## 2020-04-01 DIAGNOSIS — E1129 Type 2 diabetes mellitus with other diabetic kidney complication: Secondary | ICD-10-CM | POA: Diagnosis not present

## 2020-04-01 DIAGNOSIS — N2581 Secondary hyperparathyroidism of renal origin: Secondary | ICD-10-CM | POA: Diagnosis not present

## 2020-04-01 DIAGNOSIS — R52 Pain, unspecified: Secondary | ICD-10-CM | POA: Diagnosis not present

## 2020-04-04 ENCOUNTER — Other Ambulatory Visit: Payer: Self-pay

## 2020-04-04 ENCOUNTER — Ambulatory Visit (INDEPENDENT_AMBULATORY_CARE_PROVIDER_SITE_OTHER): Payer: Medicare Other | Admitting: Ophthalmology

## 2020-04-04 ENCOUNTER — Encounter (INDEPENDENT_AMBULATORY_CARE_PROVIDER_SITE_OTHER): Payer: Self-pay | Admitting: Ophthalmology

## 2020-04-04 DIAGNOSIS — H3581 Retinal edema: Secondary | ICD-10-CM | POA: Diagnosis not present

## 2020-04-04 DIAGNOSIS — H35033 Hypertensive retinopathy, bilateral: Secondary | ICD-10-CM

## 2020-04-04 DIAGNOSIS — H4311 Vitreous hemorrhage, right eye: Secondary | ICD-10-CM | POA: Diagnosis not present

## 2020-04-04 DIAGNOSIS — I10 Essential (primary) hypertension: Secondary | ICD-10-CM | POA: Diagnosis not present

## 2020-04-04 DIAGNOSIS — H25813 Combined forms of age-related cataract, bilateral: Secondary | ICD-10-CM

## 2020-04-04 DIAGNOSIS — E113513 Type 2 diabetes mellitus with proliferative diabetic retinopathy with macular edema, bilateral: Secondary | ICD-10-CM

## 2020-04-04 MED ORDER — BEVACIZUMAB CHEMO INJECTION 1.25MG/0.05ML SYRINGE FOR KALEIDOSCOPE
1.2500 mg | INTRAVITREAL | Status: AC | PRN
  Administered 2020-04-04: 1.25 mg via INTRAVITREAL

## 2020-04-04 MED ORDER — BEVACIZUMAB CHEMO INJECTION 1.25MG/0.05ML SYRINGE FOR KALEIDOSCOPE
1.2500 mg | INTRAVITREAL | Status: AC | PRN
Start: 2020-04-04 — End: 2020-04-04
  Administered 2020-04-04: 1.25 mg via INTRAVITREAL

## 2020-04-04 NOTE — Progress Notes (Signed)
[facility] Note  04/04/2020     CHIEF COMPLAINT Patient presents for Retina Follow Up   HISTORY OF PRESENT ILLNESS: Jonathan Mcneil is a 65 y.o. male who presents to the clinic today for:   HPI    Retina Follow Up    Patient presents with  Diabetic Retinopathy.  In both eyes.  Since onset it is gradually improving.  I, the attending physician,  performed the HPI with the patient and updated documentation appropriately.          Comments    1 1/2 week follow up PDR OU- Pt has been itching his whole body since getting the FA dye.  He has been taking Benadryl for it.  Unsure if that caused it but that is when it started. Patient thinks eyes are improving. LBS 120 on Tuesday at dialysis (Was told by PCP he didn't have diabetes any more) A1C in the 5's        Last edited by Bernarda Caffey, MD on 04/04/2020 11:57 AM. (History)    pt states vision is continuing to clear up, he is still sleeping with his head elevated  Referring physician: Merrilee Seashore, MD [place],  [place] [address]  HISTORICAL INFORMATION:   Selected notes from the [facility] Referred by Dr. Maryjane Hurter for heme OD   CURRENT MEDICATIONS: No current outpatient medications on file. (Ophthalmic Drugs)   No current facility-administered medications for this visit. (Ophthalmic Drugs)   Current Outpatient Medications (Other)  Medication Sig  . acetaminophen (TYLENOL) 650 MG CR tablet Take 1,300 mg by mouth 2 (two) times daily as needed for pain.  Marland Kitchen amLODipine (NORVASC) 10 MG tablet Take 10 mg by mouth at bedtime.   Marland Kitchen azithromycin (ZITHROMAX Z-PAK) 250 MG tablet Take 1 tablet (250 mg total) by mouth daily. (Patient not taking: No sig reported)  . benzonatate (TESSALON) 100 MG capsule Take 1 capsule (100 mg total) by mouth every 8 (eight) hours. (Patient not taking: No sig reported)  . calcitRIOL (ROCALTROL) 0.25 MCG capsule Take 1 capsule  (0.25 mcg total) by mouth every Monday, Wednesday, and Friday with hemodialysis. (Patient not taking: No sig reported)  . Multiple Vitamins-Minerals (MULTIVITAMIN) LIQD Take 32 oz by mouth daily. (Patient not taking: Reported on 04/04/2020)  . pantoprazole (PROTONIX) 20 MG tablet Take 1 tablet (20 mg total) by mouth daily. (Patient not taking: No sig reported)  . pantoprazole (PROTONIX) 40 MG tablet Take 40 mg by mouth daily. (Patient not taking: No sig reported)  . promethazine (PHENERGAN) 25 MG suppository Place 1 suppository (25 mg total) rectally every 8 (eight) hours as needed for nausea or vomiting. (Patient not taking: No sig reported)  . promethazine (PHENERGAN) 25 MG tablet Take 25 mg by mouth 3 (three) times daily. (Patient not taking: No sig reported)  . sucralfate (CARAFATE) 1 g tablet Take 1 tablet (1 g total) by mouth 4 (four) times daily -  with meals and at bedtime. (Patient not taking: No sig reported)  . VELPHORO 500 MG chewable tablet Chew 500 mg by mouth 3 (three) times daily. (Patient not taking: No sig reported)   No current facility-administered medications for this visit. (Other)      REVIEW OF SYSTEMS: ROS    Positive for: Neurological, Genitourinary, Endocrine, Eyes, Psychiatric   Negative for: Constitutional, Gastrointestinal, Skin, Musculoskeletal, HENT, Cardiovascular, Respiratory, Allergic/Imm, Heme/Lymph   Last edited by Leonie Douglas, COA on  04/04/2020  8:21 AM. (History)       ALLERGIES Allergies  Allergen Reactions  . Ibuprofen Other (See Comments)    Unknown reaction  . Penicillins Itching and Other (See Comments)    Has patient had a PCN reaction causing immediate rash, facial/tongue/throat swelling, SOB or lightheadedness with hypotension: No Has patient had a PCN reaction causing severe rash involving mucus membranes or skin necrosis: No Has patient had a PCN reaction that required hospitalization No Has patient had a PCN reaction occurring within  the last 10 years: Unknown If all of the above answers are "NO", then may proceed with Cephalosporin use.    PAST MEDICAL HISTORY Past Medical History:  Diagnosis Date  . Diabetes mellitus without complication ([place])   . ESRD (end stage renal disease) ([place])   . GERD (gastroesophageal reflux disease)    PMH  . Hypertension   . Low back pain   . Metabolic acidosis   . Neuromuscular disorder ([place])    peripheral neuropathy  . Pancreatitis   . Schizophrenia ([place])    does not take medications   Past Surgical History:  Procedure Laterality Date  . A/V FISTULAGRAM Left 06/03/2018   Procedure: A/V FISTULAGRAM;  Surgeon: Marty Heck, MD;  Location: [facility];  Service: Cardiovascular;  Laterality: Left;  . AV FISTULA PLACEMENT Left 02/11/2018   Procedure: INSERTION OF ARTERIOVENOUS (AV) FISTULA LEFT  ARM;  Surgeon: [name], MD;  Location: [facility];  Service: Vascular;  Laterality: Left;  . BASCILIC VEIN TRANSPOSITION Left 04/17/2018   Procedure: BASILIC VEIN TRANSPOSITION SECOND STAGE;  Surgeon: [name], MD;  Location: [facility];  Service: Vascular;  Laterality: Left;  . BIOPSY  10/20/2019   Procedure: BIOPSY;  Surgeon: Clarene Essex, MD;  Location: [facility];  Service: Endoscopy;;  . COLONOSCOPY    . DENTAL SURGERY    . ESOPHAGOGASTRODUODENOSCOPY (EGD) WITH PROPOFOL N/A 10/20/2019   Procedure: ESOPHAGOGASTRODUODENOSCOPY (EGD) WITH PROPOFOL;  Surgeon: Clarene Essex, MD;  Location: [facility];  Service: Endoscopy;  Laterality: N/A;  . INSERTION OF DIALYSIS CATHETER Right 02/25/2018   Procedure: INSERTION OF DIALYSIS CATHETER;  Surgeon: [name], MD;  Location: [facility];  Service: Vascular;  Laterality: Right;  . PERIPHERAL VASCULAR BALLOON ANGIOPLASTY  06/03/2018   Procedure: PERIPHERAL VASCULAR BALLOON ANGIOPLASTY;  Surgeon: Marty Heck, MD;  Location: [facility];  Service: Cardiovascular;;  left a/v fistula     FAMILY HISTORY Family History  Problem Relation Age of Onset  . Kidney failure Mother   . Diabetes Father   . Blindness Brother     SOCIAL HISTORY Social History   Tobacco Use  . Smoking status: Former Research scientist (life sciences)  . Smokeless tobacco: Never Used  Vaping Use  . Vaping Use: Never used  Substance Use Topics  . Alcohol use: No  . Drug use: Not Currently    Types: Cocaine, Marijuana    Comment: smoked marijuana a few days ago; he denies using cocaine         OPHTHALMIC EXAM:  Base Eye Exam    Visual Acuity (Snellen - Linear)      Right Left   Dist [place] 20/25 20/25   Dist ph [place] NI 20/20       Tonometry (Tonopen, 8:28 AM)      Right Left   Pressure 19 20       Pupils      Dark Light Shape React APD   Right 3  2 Round Brisk None   Left 3 2 Round Brisk None       Visual Fields (Counting fingers)      Left Right    Full Full       Extraocular Movement      Right Left    Full Full       Neuro/Psych    Oriented x3: Yes   Mood/Affect: Normal       Dilation    Both eyes: 1.0% Mydriacyl, 2.5% Phenylephrine @ 8:28 AM        Slit Lamp and Fundus Exam    Slit Lamp Exam      Right Left   Lids/Lashes Dermatochalasis - upper lid Dermatochalasis - upper lid   Conjunctiva/Sclera Melanosis Melanosis, temporal pinguecula   Cornea 1+ Punctate epithelial erosions, mild arcus 1+ Punctate epithelial erosions, mild arcus   Anterior Chamber Deep and quiet Deep and quiet   Iris Round and dilated, No NVI Round and dilated, No NVI   Lens 2-3+ Nuclear sclerosis, 2-3+ Cortical cataract 2-3+ Nuclear sclerosis, 2-3+ Cortical cataract   Vitreous Vitreous syneresis, +RBC, blood stained vitreous condensations now white, clearing centrally and settling inferiorly, white blood clots settled inferiorly Vitreous syneresis, +RBC       Fundus Exam      Right Left   Disc Mild Pallor, Sharp rim Mild Pallor, Sharp rim   C/D Ratio 0.5 0.4   Macula Flat, Good foveal reflex, RPE mottling  and clumping, trace cystic changes Flat, Blunted foveal reflex, RPE mottling and clumping, scattered Microaneurysms/DBH greatest temporal macula   Vessels attenuated, Tortuous, +NV nasal midzone attenuated, Tortuous, +NV nasal to disc   Periphery Attached, 360 IRH/DBH    Attached, scattered IRH/DBH greatest nasal             IMAGING AND PROCEDURES  Imaging and Procedures for 04/04/2020  OCT, Retina - OU - Both Eyes       Right Eye Quality was good. Central Foveal Thickness: 236. Progression has improved. Findings include no IRF, no SRF, normal foveal contour (interval improvement in vitreous opacities and cystic changes).   Left Eye Quality was good. Central Foveal Thickness: 233. Progression has improved. Findings include normal foveal contour, no SRF, intraretinal fluid (Interval improvement in cystic changes; Partial PVD).   Notes *Images captured and stored on drive  Diagnosis / Impression:  OD: interval improvement in vitreous opacities and cystic changes OS: NFP, no SRF; +IRF, Interval improvement in cystic changes; Partial PVD  Clinical management:  See below  Abbreviations: NFP - Normal foveal profile. CME - cystoid macular edema. PED - pigment epithelial detachment. IRF - intraretinal fluid. SRF - subretinal fluid. EZ - ellipsoid zone. ERM - epiretinal membrane. ORA - outer retinal atrophy. ORT - outer retinal tubulation. SRHM - subretinal hyper-reflective material. IRHM - intraretinal hyper-reflective material        Intravitreal Injection, Pharmacologic Agent - OD - Right Eye       Time Out 04/04/2020. 9:08 AM. Confirmed correct patient, procedure, site, and patient consented.   Anesthesia Topical anesthesia was used. Anesthetic medications included Lidocaine 2%, Proparacaine 0.5%.   Procedure Preparation included 5% betadine to ocular surface, eyelid speculum. A supplied needle was used.   Injection:  1.25 mg Bevacizumab (AVASTIN) 1.25mg /0.6mL SOLN   NDC:  37106-269-48, Lot: [id number] , Expiration date: 05/03/2020   Route: Intravitreal, Site: Right Eye, Waste: 0 mL  Post-op Post injection exam found visual acuity of at least counting fingers. The patient tolerated the  procedure well. There were no complications. The patient received written and verbal post procedure care education.        Intravitreal Injection, Pharmacologic Agent - OS - Left Eye       Time Out 04/04/2020. 9:08 AM. Confirmed correct patient, procedure, site, and patient consented.   Anesthesia Topical anesthesia was used. Anesthetic medications included Lidocaine 2%, Proparacaine 0.5%.   Procedure Preparation included 5% betadine to ocular surface, eyelid speculum. A (32g) needle was used.   Injection:  1.25 mg Bevacizumab (AVASTIN) 1.25mg /0.4mL SOLN   NDC: 54008-676-19, Lot: [id number], Expiration date: 05/11/2020   Route: Intravitreal, Site: Left Eye, Waste: 0.05 mL  Post-op Post injection exam found visual acuity of at least counting fingers. The patient tolerated the procedure well. There were no complications. The patient received written and verbal post procedure care education.                 ASSESSMENT/PLAN:    ICD-10-CM   1. Proliferative diabetic retinopathy of both eyes with macular edema associated with type 2 diabetes mellitus (HCC)  T24.5809 Intravitreal Injection, Pharmacologic Agent - OD - Right Eye    Intravitreal Injection, Pharmacologic Agent - OS - Left Eye    Bevacizumab (AVASTIN) SOLN 1.25 mg    Bevacizumab (AVASTIN) SOLN 1.25 mg  2. Retinal edema  H35.81 OCT, Retina - OU - Both Eyes  3. Vitreous hemorrhage, right eye ([place])  H43.11   4. Essential hypertension  I10   5. Hypertensive retinopathy of both eyes  H35.033   6. Combined forms of age-related cataract of both eyes  H25.813     1,2. Proliferative diabetic retinopathy OU (OD > OS) - s/p IVA OD #1 (12.08.21) - s/p IVA OS #1 (12.10.21) - exam shows preretinal/subhyaloid heme  and VH OD improving, OS with just scattered MA, IRH - FA (12.08.21) shows late-leaking MA, vascular nonperfusion, +NV OU (nasal midzone) -- would benefit from PRP OU - OCT with mild cystic changes OS; diffuse atrophy/thinning OU - recommend IVA OU #2 today, 01.11.22 - pt wishes to proceed  - RBA of procedure discussed, questions answered  - IVA informed consents signed and scanned Dec 2021 - see procedure note - f/u Monday for PRP   3. Vitreous Hemorrhage OD -- improving  - acute onset with violent coughing spell  - underlying etiology related to NV/PDR as described above, but contributed by use of heparin for dialysis  - VH clearing  - VH precautions reviewed -- minimize activities, keep head elevated, avoid ASA/NSAIDs/blood thinners as able  - monitor  4,5. Hypertensive retinopathy OU - discussed importance of tight BP control - monitor  6. Mixed Cataract OU - The symptoms of cataract, surgical options, and treatments and risks were discussed with patient. - discussed diagnosis and progression - not yet visually significant - monitor for now  Ophthalmic Meds Ordered this visit:  Meds ordered this encounter  Medications  . Bevacizumab (AVASTIN) SOLN 1.25 mg  . Bevacizumab (AVASTIN) SOLN 1.25 mg      Return for f/u Monday, 10:00, PDR OU, DFE, OCT.  There are no Patient Instructions on file for this visit.  Explained the diagnoses, plan, and follow up with the patient and they expressed understanding.  Patient expressed understanding of the importance of proper follow up care.   This document serves as a record of services personally performed by Gardiner Sleeper, MD, PhD. It was created on their behalf by San Jetty. Owens Shark, OA an ophthalmic technician. The creation  of this record is the provider's dictation and/or activities during the visit.    Electronically signed by: San Jetty. Owens Shark, [place] 01.11.2021 12:00 PM  Gardiner Sleeper, M.D., Ph.D. Diseases & Surgery of the Retina  and [place] 04/04/2020   I have reviewed the above documentation for accuracy and completeness, and I agree with the above. Gardiner Sleeper, M.D., Ph.D. 04/04/20 12:03 PM  Abbreviations: M myopia (nearsighted); A astigmatism; H hyperopia (farsighted); P presbyopia; Mrx spectacle prescription;  CTL contact lenses; OD right eye; OS left eye; OU both eyes  XT exotropia; ET esotropia; PEK punctate epithelial keratitis; PEE punctate epithelial erosions; DES dry eye syndrome; MGD meibomian gland dysfunction; ATs artificial tears; PFAT's preservative free artificial tears; [place] nuclear sclerotic cataract; PSC posterior subcapsular cataract; ERM epi-retinal membrane; PVD posterior vitreous detachment; RD retinal detachment; DM diabetes mellitus; DR diabetic retinopathy; NPDR non-proliferative diabetic retinopathy; PDR proliferative diabetic retinopathy; CSME clinically significant macular edema; DME diabetic macular edema; dbh dot blot hemorrhages; CWS cotton wool spot; POAG primary open angle glaucoma; C/D cup-to-disc ratio; HVF humphrey visual field; GVF goldmann visual field; OCT optical coherence tomography; IOP intraocular pressure; BRVO Branch retinal vein occlusion; CRVO central retinal vein occlusion; CRAO central retinal artery occlusion; BRAO branch retinal artery occlusion; RT retinal tear; SB scleral buckle; PPV pars plana vitrectomy; VH Vitreous hemorrhage; PRP panretinal laser photocoagulation; IVK intravitreal kenalog; VMT vitreomacular traction; MH Macular hole;  NVD neovascularization of the disc; NVE neovascularization elsewhere; AREDS age related eye disease study; ARMD age related macular degeneration; POAG primary open angle glaucoma; EBMD epithelial/anterior basement membrane dystrophy; ACIOL anterior chamber intraocular lens; IOL intraocular lens; PCIOL posterior chamber intraocular lens; Phaco/IOL phacoemulsification with intraocular lens placement; [place]  photorefractive keratectomy; LASIK laser assisted in situ keratomileusis; HTN hypertension; DM diabetes mellitus; COPD chronic obstructive pulmonary disease

## 2020-04-06 DIAGNOSIS — N2581 Secondary hyperparathyroidism of renal origin: Secondary | ICD-10-CM | POA: Diagnosis not present

## 2020-04-06 DIAGNOSIS — R52 Pain, unspecified: Secondary | ICD-10-CM | POA: Diagnosis not present

## 2020-04-06 DIAGNOSIS — Z992 Dependence on renal dialysis: Secondary | ICD-10-CM | POA: Diagnosis not present

## 2020-04-06 DIAGNOSIS — D689 Coagulation defect, unspecified: Secondary | ICD-10-CM | POA: Diagnosis not present

## 2020-04-06 DIAGNOSIS — D509 Iron deficiency anemia, unspecified: Secondary | ICD-10-CM | POA: Diagnosis not present

## 2020-04-06 DIAGNOSIS — E1129 Type 2 diabetes mellitus with other diabetic kidney complication: Secondary | ICD-10-CM | POA: Diagnosis not present

## 2020-04-06 DIAGNOSIS — N186 End stage renal disease: Secondary | ICD-10-CM | POA: Diagnosis not present

## 2020-04-08 DIAGNOSIS — D689 Coagulation defect, unspecified: Secondary | ICD-10-CM | POA: Diagnosis not present

## 2020-04-08 DIAGNOSIS — D509 Iron deficiency anemia, unspecified: Secondary | ICD-10-CM | POA: Diagnosis not present

## 2020-04-08 DIAGNOSIS — N186 End stage renal disease: Secondary | ICD-10-CM | POA: Diagnosis not present

## 2020-04-08 DIAGNOSIS — R52 Pain, unspecified: Secondary | ICD-10-CM | POA: Diagnosis not present

## 2020-04-08 DIAGNOSIS — E1129 Type 2 diabetes mellitus with other diabetic kidney complication: Secondary | ICD-10-CM | POA: Diagnosis not present

## 2020-04-08 DIAGNOSIS — N2581 Secondary hyperparathyroidism of renal origin: Secondary | ICD-10-CM | POA: Diagnosis not present

## 2020-04-08 DIAGNOSIS — Z992 Dependence on renal dialysis: Secondary | ICD-10-CM | POA: Diagnosis not present

## 2020-04-10 ENCOUNTER — Encounter (INDEPENDENT_AMBULATORY_CARE_PROVIDER_SITE_OTHER): Payer: Medicare Other | Admitting: Ophthalmology

## 2020-04-11 DIAGNOSIS — D689 Coagulation defect, unspecified: Secondary | ICD-10-CM | POA: Diagnosis not present

## 2020-04-11 DIAGNOSIS — N2581 Secondary hyperparathyroidism of renal origin: Secondary | ICD-10-CM | POA: Diagnosis not present

## 2020-04-11 DIAGNOSIS — E1129 Type 2 diabetes mellitus with other diabetic kidney complication: Secondary | ICD-10-CM | POA: Diagnosis not present

## 2020-04-11 DIAGNOSIS — R52 Pain, unspecified: Secondary | ICD-10-CM | POA: Diagnosis not present

## 2020-04-11 DIAGNOSIS — D509 Iron deficiency anemia, unspecified: Secondary | ICD-10-CM | POA: Diagnosis not present

## 2020-04-11 DIAGNOSIS — Z992 Dependence on renal dialysis: Secondary | ICD-10-CM | POA: Diagnosis not present

## 2020-04-11 DIAGNOSIS — N186 End stage renal disease: Secondary | ICD-10-CM | POA: Diagnosis not present

## 2020-04-12 ENCOUNTER — Encounter (INDEPENDENT_AMBULATORY_CARE_PROVIDER_SITE_OTHER): Payer: Medicare Other | Admitting: Ophthalmology

## 2020-04-12 DIAGNOSIS — I129 Hypertensive chronic kidney disease with stage 1 through stage 4 chronic kidney disease, or unspecified chronic kidney disease: Secondary | ICD-10-CM | POA: Diagnosis not present

## 2020-04-12 DIAGNOSIS — E1165 Type 2 diabetes mellitus with hyperglycemia: Secondary | ICD-10-CM | POA: Diagnosis not present

## 2020-04-12 DIAGNOSIS — N185 Chronic kidney disease, stage 5: Secondary | ICD-10-CM | POA: Diagnosis not present

## 2020-04-12 DIAGNOSIS — E1021 Type 1 diabetes mellitus with diabetic nephropathy: Secondary | ICD-10-CM | POA: Diagnosis not present

## 2020-04-12 DIAGNOSIS — E782 Mixed hyperlipidemia: Secondary | ICD-10-CM | POA: Diagnosis not present

## 2020-04-13 DIAGNOSIS — N2581 Secondary hyperparathyroidism of renal origin: Secondary | ICD-10-CM | POA: Diagnosis not present

## 2020-04-13 DIAGNOSIS — Z992 Dependence on renal dialysis: Secondary | ICD-10-CM | POA: Diagnosis not present

## 2020-04-13 DIAGNOSIS — R52 Pain, unspecified: Secondary | ICD-10-CM | POA: Diagnosis not present

## 2020-04-13 DIAGNOSIS — E1129 Type 2 diabetes mellitus with other diabetic kidney complication: Secondary | ICD-10-CM | POA: Diagnosis not present

## 2020-04-13 DIAGNOSIS — D689 Coagulation defect, unspecified: Secondary | ICD-10-CM | POA: Diagnosis not present

## 2020-04-13 DIAGNOSIS — D509 Iron deficiency anemia, unspecified: Secondary | ICD-10-CM | POA: Diagnosis not present

## 2020-04-13 DIAGNOSIS — N186 End stage renal disease: Secondary | ICD-10-CM | POA: Diagnosis not present

## 2020-04-14 NOTE — Progress Notes (Signed)
[facility] Note  04/19/2020     CHIEF COMPLAINT Patient presents for Retina Follow Up   HISTORY OF PRESENT ILLNESS: Jonathan Mcneil is a 65 y.o. male who presents to the clinic today for:   HPI    Retina Follow Up    Patient presents with  Diabetic Retinopathy.  In both eyes.  This started 2 weeks ago.  I, the attending physician,  performed the HPI with the patient and updated documentation appropriately.          Comments    Patient here for 2 weeks retina follow up for PDR OU. Patient states vision a little better. No eye pain.        Last edited by Bernarda Caffey, MD on 04/19/2020  8:13 AM. (History)    pt here for [facility]  Referring physician: Merrilee Seashore, MD [place],  [place] [id number]  HISTORICAL INFORMATION:   Selected notes from the MEDICAL RECORD NUMBER Referred by Dr. Maryjane Hurter for heme OD   CURRENT MEDICATIONS: Current Outpatient Medications (Ophthalmic Drugs)  Medication Sig  . prednisoLONE acetate (PRED FORTE) 1 % ophthalmic suspension Place 1 drop into the right eye 4 (four) times daily for 7 days.   No current facility-administered medications for this visit. (Ophthalmic Drugs)   Current Outpatient Medications (Other)  Medication Sig  . acetaminophen (TYLENOL) 650 MG CR tablet Take 1,300 mg by mouth 2 (two) times daily as needed for pain.  Marland Kitchen amLODipine (NORVASC) 10 MG tablet Take 10 mg by mouth at bedtime.   Marland Kitchen azithromycin (ZITHROMAX Z-PAK) 250 MG tablet Take 1 tablet (250 mg total) by mouth daily. (Patient not taking: No sig reported)  . benzonatate (TESSALON) 100 MG capsule Take 1 capsule (100 mg total) by mouth every 8 (eight) hours. (Patient not taking: No sig reported)  . calcitRIOL (ROCALTROL) 0.25 MCG capsule Take 1 capsule (0.25 mcg total) by mouth every Monday, Wednesday, and Friday with hemodialysis. (Patient not taking: No sig reported)  . Multiple Vitamins-Minerals (MULTIVITAMIN)  LIQD Take 32 oz by mouth daily. (Patient not taking: Reported on 04/04/2020)  . pantoprazole (PROTONIX) 20 MG tablet Take 1 tablet (20 mg total) by mouth daily. (Patient not taking: No sig reported)  . pantoprazole (PROTONIX) 40 MG tablet Take 40 mg by mouth daily. (Patient not taking: No sig reported)  . promethazine (PHENERGAN) 25 MG suppository Place 1 suppository (25 mg total) rectally every 8 (eight) hours as needed for nausea or vomiting. (Patient not taking: No sig reported)  . promethazine (PHENERGAN) 25 MG tablet Take 25 mg by mouth 3 (three) times daily. (Patient not taking: No sig reported)  . sucralfate (CARAFATE) 1 g tablet Take 1 tablet (1 g total) by mouth 4 (four) times daily -  with meals and at bedtime. (Patient not taking: No sig reported)  . VELPHORO 500 MG chewable tablet Chew 500 mg by mouth 3 (three) times daily. (Patient not taking: No sig reported)   No current facility-administered medications for this visit. (Other)      REVIEW OF SYSTEMS: ROS    Positive for: Neurological, Genitourinary, Endocrine, Eyes, Psychiatric   Negative for: Constitutional, Gastrointestinal, Skin, Musculoskeletal, HENT, Cardiovascular, Respiratory, Allergic/Imm, Heme/Lymph   Last edited by Theodore Demark, COA on 04/19/2020  8:06 AM. (History)       ALLERGIES Allergies  Allergen Reactions  . Ibuprofen Other (See Comments)    Unknown reaction  . Penicillins Itching  and Other (See Comments)    Has patient had a PCN reaction causing immediate rash, facial/tongue/throat swelling, SOB or lightheadedness with hypotension: No Has patient had a PCN reaction causing severe rash involving mucus membranes or skin necrosis: No Has patient had a PCN reaction that required hospitalization No Has patient had a PCN reaction occurring within the last 10 years: Unknown If all of the above answers are "NO", then may proceed with Cephalosporin use.    PAST MEDICAL HISTORY Past Medical History:   Diagnosis Date  . Diabetes mellitus without complication ([place])   . ESRD (end stage renal disease) ([place])   . GERD (gastroesophageal reflux disease)    PMH  . Hypertension   . Low back pain   . Metabolic acidosis   . Neuromuscular disorder ([place])    peripheral neuropathy  . Pancreatitis   . Schizophrenia ([place])    does not take medications   Past Surgical History:  Procedure Laterality Date  . A/V FISTULAGRAM Left 06/03/2018   Procedure: A/V FISTULAGRAM;  Surgeon: Marty Heck, MD;  Location: [place] [facility];  Service: Cardiovascular;  Laterality: Left;  . AV FISTULA PLACEMENT Left 02/11/2018   Procedure: INSERTION OF ARTERIOVENOUS (AV) FISTULA LEFT  ARM;  Surgeon: [name], MD;  Location: [place];  Service: Vascular;  Laterality: Left;  . BASCILIC VEIN TRANSPOSITION Left 04/17/2018   Procedure: BASILIC VEIN TRANSPOSITION SECOND STAGE;  Surgeon: [name], MD;  Location: [place];  Service: Vascular;  Laterality: Left;  . BIOPSY  10/20/2019   Procedure: BIOPSY;  Surgeon: Clarene Essex, MD;  Location: [place];  Service: Endoscopy;;  . COLONOSCOPY    . DENTAL SURGERY    . ESOPHAGOGASTRODUODENOSCOPY (EGD) WITH PROPOFOL N/A 10/20/2019   Procedure: ESOPHAGOGASTRODUODENOSCOPY (EGD) WITH PROPOFOL;  Surgeon: Clarene Essex, MD;  Location: [facility];  Service: Endoscopy;  Laterality: N/A;  . INSERTION OF DIALYSIS CATHETER Right 02/25/2018   Procedure: INSERTION OF DIALYSIS CATHETER;  Surgeon: [name], MD;  Location: [facility];  Service: Vascular;  Laterality: Right;  . PERIPHERAL VASCULAR BALLOON ANGIOPLASTY  06/03/2018   Procedure: PERIPHERAL VASCULAR BALLOON ANGIOPLASTY;  Surgeon: Marty Heck, MD;  Location: [facility];  Service: Cardiovascular;;  left a/v fistula    FAMILY HISTORY Family History  Problem Relation Age of Onset  . Kidney failure Mother   . Diabetes Father   . Blindness Brother     SOCIAL  HISTORY Social History   Tobacco Use  . Smoking status: Former Research scientist (life sciences)  . Smokeless tobacco: Never Used  Vaping Use  . Vaping Use: Never used  Substance Use Topics  . Alcohol use: No  . Drug use: Not Currently    Types: Cocaine, Marijuana    Comment: smoked marijuana a few days ago; he denies using cocaine         OPHTHALMIC EXAM:  Base Eye Exam    Visual Acuity (Snellen - Linear)      Right Left   Dist [place] 20/30 +1 20/25 -2   Dist ph [place] 20/25 -2 20/20 -1       Tonometry (Tonopen, 8:03 AM)      Right Left   Pressure 19 18       Pupils      Dark Light Shape React APD   Right 3 2 Round Brisk None   Left 3 2 Round Brisk None       Visual Fields (Counting fingers)  Left Right    Full Full       Extraocular Movement      Right Left    Full Full       Neuro/Psych    Oriented x3: Yes   Mood/Affect: Normal       Dilation    Both eyes: 1.0% Mydriacyl, 2.5% Phenylephrine @ 8:03 AM        Slit Lamp and Fundus Exam    Slit Lamp Exam      Right Left   Lids/Lashes Dermatochalasis - upper lid Dermatochalasis - upper lid   Conjunctiva/Sclera Melanosis Melanosis, temporal pinguecula   Cornea 1+ Punctate epithelial erosions, mild arcus 1+ Punctate epithelial erosions, mild arcus   Anterior Chamber Deep and quiet Deep and quiet   Iris Round and dilated, No NVI Round and dilated, No NVI   Lens 2-3+ Nuclear sclerosis, 2-3+ Cortical cataract 2-3+ Nuclear sclerosis, 2-3+ Cortical cataract   Vitreous Vitreous syneresis, +RBC, blood stained vitreous condensations now white, clearing centrally and settling inferiorly, white blood clots settled inferiorly Vitreous syneresis, +RBC       Fundus Exam      Right Left   Disc Mild Pallor, Sharp rim Mild Pallor, Sharp rim   C/D Ratio 0.5 0.4   Macula Flat, Good foveal reflex, RPE mottling and clumping, trace cystic changes Flat, Blunted foveal reflex, RPE mottling and clumping, scattered Microaneurysms/DBH greatest temporal  macula   Vessels attenuated, Tortuous, +NV nasal midzone attenuated, Tortuous, +NV nasal to disc   Periphery Attached, 360 IRH/DBH Attached, scattered IRH/DBH greatest nasal             IMAGING AND PROCEDURES  Imaging and Procedures for 04/19/2020  OCT, Retina - OU - Both Eyes       Right Eye Quality was good. Central Foveal Thickness: 234. Progression has improved. Findings include no IRF, no SRF, normal foveal contour (interval improvement in vitreous opacities).   Left Eye Quality was good. Central Foveal Thickness: 233. Progression has been stable. Findings include normal foveal contour, no SRF, no IRF (Interval improvement in cystic changes; Partial PVD).   Notes *Images captured and stored on drive  Diagnosis / Impression:  OD: NFP; no IRF/SRF; interval improvement in vitreous opacities OS: NFP, no IRF/SRF; tr cystic changes; Partial PVD  Clinical management:  See below  Abbreviations: NFP - Normal foveal profile. CME - cystoid macular edema. PED - pigment epithelial detachment. IRF - intraretinal fluid. SRF - subretinal fluid. EZ - ellipsoid zone. ERM - epiretinal membrane. ORA - outer retinal atrophy. ORT - outer retinal tubulation. SRHM - subretinal hyper-reflective material. IRHM - intraretinal hyper-reflective material        Panretinal Photocoagulation - OD - Right Eye       LASER PROCEDURE NOTE  Diagnosis:   Proliferative Diabetic Retinopathy, RIGHT EYE  Procedure:  Pan-retinal photocoagulation using slit lamp laser, RIGHT EYE  Anesthesia:  Topical  Surgeon: Bernarda Caffey, MD, PhD   Informed consent obtained, operative eye marked, and time out performed prior to initiation of laser.   [place] [id number] slit lamp laser Pattern: 3x3 square Power: 260 mW Duration: 30 msec  Spot size: 200 microns  # spots: 1399 spots  Notes: significant vitreous heme obscuring view and preventing laser up take inferiorly, nasally and scattered focal areas  RTC: Feb  8 or later  Patient tolerated the procedure well and received written and verbal post-procedure care information/education.  ASSESSMENT/PLAN:    ICD-10-CM   1. Proliferative diabetic retinopathy of both eyes with macular edema associated with type 2 diabetes mellitus ([place])  [id number] Panretinal Photocoagulation - OD - Right Eye  2. Retinal edema  H35.81 OCT, Retina - OU - Both Eyes  3. Vitreous hemorrhage, right eye ([place])  H43.11   4. Essential hypertension  I10   5. Hypertensive retinopathy of both eyes  H35.033   6. Combined forms of age-related cataract of both eyes  H25.813     1,2. Proliferative diabetic retinopathy OU (OD > OS) - s/p IVA OD #1 (12.08.21), #2 (01.11.22) - s/p IVA OS #1 (12.10.21), #2 (01.11.22) - exam shows preretinal/subhyaloid heme and VH OD improving, OS with just scattered MA, IRH - FA (12.08.21) shows late-leaking MA, vascular nonperfusion, +NV OU (nasal midzone) -- would benefit from PRP OU - OCT with mild cystic changes OS; diffuse atrophy/thinning OU - recommend PRP OD today, 12.26.22 - pt wishes to proceed with laser  - RBA of procedure discussed, questions answered  - IVA informed consents signed - see procedure note - start PF QID OD x7 days - f/u February 8 or later  3. Vitreous Hemorrhage OD -- improving  - acute onset with violent coughing spell  - underlying etiology related to NV/PDR as described above, but contributed by use of heparin for dialysis  - VH clearing  - PRP OD today as above  - VH precautions reviewed -- minimize activities, keep head elevated, avoid ASA/NSAIDs/blood thinners as able  - monitor  4,5. Hypertensive retinopathy OU - discussed importance of tight BP control - monitor  6. Mixed Cataract OU - The symptoms of cataract, surgical options, and treatments and risks were discussed with patient. - discussed diagnosis and progression - not yet visually significant - monitor for  now  Ophthalmic Meds Ordered this visit:  Meds ordered this encounter  Medications  . prednisoLONE acetate (PRED FORTE) 1 % ophthalmic suspension    Sig: Place 1 drop into the right eye 4 (four) times daily for 7 days.    Dispense:  10 mL    Refill:  0      Return for f/u February 8 or later, PDR OU, DFE, OCT.  There are no Patient Instructions on file for this visit.  Explained the diagnoses, plan, and follow up with the patient and they expressed understanding.  Patient expressed understanding of the importance of proper follow up care.   This document serves as a record of services personally performed by Gardiner Sleeper, MD, PhD. It was created on their behalf by Roselee Nova, COMT. The creation of this record is the provider's dictation and/or activities during the visit.  Electronically signed by: Roselee Nova, COMT 04/19/20 8:45 AM   This document serves as a record of services personally performed by Gardiner Sleeper, MD, PhD. It was created on their behalf by San Jetty. Owens Shark, OA an ophthalmic technician. The creation of this record is the provider's dictation and/or activities during the visit.    Electronically signed by: San Jetty. Owens Shark, [place] 01.26.2022 8:45 AM  Gardiner Sleeper, M.D., Ph.D. Diseases & Surgery of the Retina and Vitreous [facility]  I have reviewed the above documentation for accuracy and completeness, and I agree with the above. Gardiner Sleeper, M.D., Ph.D. 04/19/20 8:45 AM  Abbreviations: M myopia (nearsighted); A astigmatism; H hyperopia (farsighted); P presbyopia; Mrx spectacle prescription;  CTL contact lenses; OD right eye; OS left  eye; OU both eyes  XT exotropia; ET esotropia; PEK punctate epithelial keratitis; PEE punctate epithelial erosions; DES dry eye syndrome; MGD meibomian gland dysfunction; ATs artificial tears; PFAT's preservative free artificial tears; [place] nuclear sclerotic cataract; PSC posterior subcapsular cataract;  ERM epi-retinal membrane; PVD posterior vitreous detachment; RD retinal detachment; DM diabetes mellitus; DR diabetic retinopathy; NPDR non-proliferative diabetic retinopathy; PDR proliferative diabetic retinopathy; CSME clinically significant macular edema; DME diabetic macular edema; dbh dot blot hemorrhages; CWS cotton wool spot; POAG primary open angle glaucoma; C/D cup-to-disc ratio; HVF humphrey visual field; GVF goldmann visual field; OCT optical coherence tomography; IOP intraocular pressure; BRVO Branch retinal vein occlusion; CRVO central retinal vein occlusion; CRAO central retinal artery occlusion; BRAO branch retinal artery occlusion; RT retinal tear; SB scleral buckle; PPV pars plana vitrectomy; VH Vitreous hemorrhage; PRP panretinal laser photocoagulation; IVK intravitreal kenalog; VMT vitreomacular traction; MH Macular hole;  NVD neovascularization of the disc; NVE neovascularization elsewhere; AREDS age related eye disease study; ARMD age related macular degeneration; POAG primary open angle glaucoma; EBMD epithelial/anterior basement membrane dystrophy; ACIOL anterior chamber intraocular lens; IOL intraocular lens; PCIOL posterior chamber intraocular lens; Phaco/IOL phacoemulsification with intraocular lens placement; [place] photorefractive keratectomy; LASIK laser assisted in situ keratomileusis; HTN hypertension; DM diabetes mellitus; COPD chronic obstructive pulmonary disease

## 2020-04-18 DIAGNOSIS — N2581 Secondary hyperparathyroidism of renal origin: Secondary | ICD-10-CM | POA: Diagnosis not present

## 2020-04-18 DIAGNOSIS — D509 Iron deficiency anemia, unspecified: Secondary | ICD-10-CM | POA: Diagnosis not present

## 2020-04-18 DIAGNOSIS — N186 End stage renal disease: Secondary | ICD-10-CM | POA: Diagnosis not present

## 2020-04-18 DIAGNOSIS — R52 Pain, unspecified: Secondary | ICD-10-CM | POA: Diagnosis not present

## 2020-04-18 DIAGNOSIS — E1129 Type 2 diabetes mellitus with other diabetic kidney complication: Secondary | ICD-10-CM | POA: Diagnosis not present

## 2020-04-18 DIAGNOSIS — Z992 Dependence on renal dialysis: Secondary | ICD-10-CM | POA: Diagnosis not present

## 2020-04-18 DIAGNOSIS — D689 Coagulation defect, unspecified: Secondary | ICD-10-CM | POA: Diagnosis not present

## 2020-04-19 ENCOUNTER — Ambulatory Visit (INDEPENDENT_AMBULATORY_CARE_PROVIDER_SITE_OTHER): Payer: Medicare Other | Admitting: Ophthalmology

## 2020-04-19 ENCOUNTER — Other Ambulatory Visit: Payer: Self-pay

## 2020-04-19 ENCOUNTER — Encounter (INDEPENDENT_AMBULATORY_CARE_PROVIDER_SITE_OTHER): Payer: Self-pay | Admitting: Ophthalmology

## 2020-04-19 DIAGNOSIS — H25813 Combined forms of age-related cataract, bilateral: Secondary | ICD-10-CM

## 2020-04-19 DIAGNOSIS — H3581 Retinal edema: Secondary | ICD-10-CM

## 2020-04-19 DIAGNOSIS — H35033 Hypertensive retinopathy, bilateral: Secondary | ICD-10-CM

## 2020-04-19 DIAGNOSIS — I10 Essential (primary) hypertension: Secondary | ICD-10-CM

## 2020-04-19 DIAGNOSIS — E113513 Type 2 diabetes mellitus with proliferative diabetic retinopathy with macular edema, bilateral: Secondary | ICD-10-CM | POA: Diagnosis not present

## 2020-04-19 DIAGNOSIS — H4311 Vitreous hemorrhage, right eye: Secondary | ICD-10-CM | POA: Diagnosis not present

## 2020-04-19 MED ORDER — PREDNISOLONE ACETATE 1 % OP SUSP
1.0000 [drp] | Freq: Four times a day (QID) | OPHTHALMIC | 0 refills | Status: AC
Start: 2020-04-19 — End: 2020-04-26

## 2020-04-20 DIAGNOSIS — E1129 Type 2 diabetes mellitus with other diabetic kidney complication: Secondary | ICD-10-CM | POA: Diagnosis not present

## 2020-04-20 DIAGNOSIS — N2581 Secondary hyperparathyroidism of renal origin: Secondary | ICD-10-CM | POA: Diagnosis not present

## 2020-04-20 DIAGNOSIS — D509 Iron deficiency anemia, unspecified: Secondary | ICD-10-CM | POA: Diagnosis not present

## 2020-04-20 DIAGNOSIS — R52 Pain, unspecified: Secondary | ICD-10-CM | POA: Diagnosis not present

## 2020-04-20 DIAGNOSIS — D689 Coagulation defect, unspecified: Secondary | ICD-10-CM | POA: Diagnosis not present

## 2020-04-20 DIAGNOSIS — Z992 Dependence on renal dialysis: Secondary | ICD-10-CM | POA: Diagnosis not present

## 2020-04-20 DIAGNOSIS — E119 Type 2 diabetes mellitus without complications: Secondary | ICD-10-CM | POA: Diagnosis not present

## 2020-04-20 DIAGNOSIS — N186 End stage renal disease: Secondary | ICD-10-CM | POA: Diagnosis not present

## 2020-04-22 DIAGNOSIS — N186 End stage renal disease: Secondary | ICD-10-CM | POA: Diagnosis not present

## 2020-04-22 DIAGNOSIS — N2581 Secondary hyperparathyroidism of renal origin: Secondary | ICD-10-CM | POA: Diagnosis not present

## 2020-04-22 DIAGNOSIS — D689 Coagulation defect, unspecified: Secondary | ICD-10-CM | POA: Diagnosis not present

## 2020-04-22 DIAGNOSIS — Z992 Dependence on renal dialysis: Secondary | ICD-10-CM | POA: Diagnosis not present

## 2020-04-22 DIAGNOSIS — E1129 Type 2 diabetes mellitus with other diabetic kidney complication: Secondary | ICD-10-CM | POA: Diagnosis not present

## 2020-04-22 DIAGNOSIS — R52 Pain, unspecified: Secondary | ICD-10-CM | POA: Diagnosis not present

## 2020-04-22 DIAGNOSIS — D509 Iron deficiency anemia, unspecified: Secondary | ICD-10-CM | POA: Diagnosis not present

## 2020-04-24 DIAGNOSIS — N186 End stage renal disease: Secondary | ICD-10-CM | POA: Diagnosis not present

## 2020-04-24 DIAGNOSIS — E1122 Type 2 diabetes mellitus with diabetic chronic kidney disease: Secondary | ICD-10-CM | POA: Diagnosis not present

## 2020-04-24 DIAGNOSIS — Z992 Dependence on renal dialysis: Secondary | ICD-10-CM | POA: Diagnosis not present

## 2020-04-25 DIAGNOSIS — Z992 Dependence on renal dialysis: Secondary | ICD-10-CM | POA: Diagnosis not present

## 2020-04-25 DIAGNOSIS — R52 Pain, unspecified: Secondary | ICD-10-CM | POA: Diagnosis not present

## 2020-04-25 DIAGNOSIS — N186 End stage renal disease: Secondary | ICD-10-CM | POA: Diagnosis not present

## 2020-04-25 DIAGNOSIS — N2581 Secondary hyperparathyroidism of renal origin: Secondary | ICD-10-CM | POA: Diagnosis not present

## 2020-04-25 DIAGNOSIS — E1129 Type 2 diabetes mellitus with other diabetic kidney complication: Secondary | ICD-10-CM | POA: Diagnosis not present

## 2020-04-25 DIAGNOSIS — D689 Coagulation defect, unspecified: Secondary | ICD-10-CM | POA: Diagnosis not present

## 2020-04-29 DIAGNOSIS — E1129 Type 2 diabetes mellitus with other diabetic kidney complication: Secondary | ICD-10-CM | POA: Diagnosis not present

## 2020-04-29 DIAGNOSIS — Z992 Dependence on renal dialysis: Secondary | ICD-10-CM | POA: Diagnosis not present

## 2020-04-29 DIAGNOSIS — N2581 Secondary hyperparathyroidism of renal origin: Secondary | ICD-10-CM | POA: Diagnosis not present

## 2020-04-29 DIAGNOSIS — R52 Pain, unspecified: Secondary | ICD-10-CM | POA: Diagnosis not present

## 2020-04-29 DIAGNOSIS — N186 End stage renal disease: Secondary | ICD-10-CM | POA: Diagnosis not present

## 2020-04-29 DIAGNOSIS — D689 Coagulation defect, unspecified: Secondary | ICD-10-CM | POA: Diagnosis not present

## 2020-05-01 NOTE — Progress Notes (Signed)
[facility] Note  05/03/2020     CHIEF COMPLAINT Patient presents for Retina Follow Up   HISTORY OF PRESENT ILLNESS: Jonathan Mcneil is a 65 y.o. male who presents to the clinic today for:   HPI    Retina Follow Up    Patient presents with  Diabetic Retinopathy.  In both eyes.  This started weeks ago.  Severity is moderate.  Duration of weeks.  Since onset it is gradually improving.  I, the attending physician,  performed the HPI with the patient and updated documentation appropriately.          Comments    Pt states vision is better OU--states black spot OD is getting better also.  Pt denies eye pain or discomfort and denies any new or worsening floaters or fol OU.       Last edited by Bernarda Caffey, MD on 05/03/2020  9:19 AM. (History)    pt   Referring physician: Merrilee Seashore, MD [address] [place],  [place] [address]  HISTORICAL INFORMATION:   Selected notes from the MEDICAL RECORD NUMBER Referred by Dr. Maryjane Hurter for heme OD   CURRENT MEDICATIONS: No current outpatient medications on file. (Ophthalmic Drugs)   No current facility-administered medications for this visit. (Ophthalmic Drugs)   Current Outpatient Medications (Other)  Medication Sig  . acetaminophen (TYLENOL) 650 MG CR tablet Take 1,300 mg by mouth 2 (two) times daily as needed for pain.  Marland Kitchen amLODipine (NORVASC) 10 MG tablet Take 10 mg by mouth at bedtime.   Marland Kitchen azithromycin (ZITHROMAX Z-PAK) 250 MG tablet Take 1 tablet (250 mg total) by mouth daily. (Patient not taking: No sig reported)  . benzonatate (TESSALON) 100 MG capsule Take 1 capsule (100 mg total) by mouth every 8 (eight) hours. (Patient not taking: No sig reported)  . calcitRIOL (ROCALTROL) 0.25 MCG capsule Take 1 capsule (0.25 mcg total) by mouth every Monday, Wednesday, and Friday with hemodialysis. (Patient not taking: No sig reported)  . Multiple Vitamins-Minerals (MULTIVITAMIN) LIQD Take 32  oz by mouth daily. (Patient not taking: Reported on 04/04/2020)  . pantoprazole (PROTONIX) 20 MG tablet Take 1 tablet (20 mg total) by mouth daily. (Patient not taking: No sig reported)  . pantoprazole (PROTONIX) 40 MG tablet Take 40 mg by mouth daily. (Patient not taking: No sig reported)  . promethazine (PHENERGAN) 25 MG suppository Place 1 suppository (25 mg total) rectally every 8 (eight) hours as needed for nausea or vomiting. (Patient not taking: No sig reported)  . promethazine (PHENERGAN) 25 MG tablet Take 25 mg by mouth 3 (three) times daily. (Patient not taking: No sig reported)  . sucralfate (CARAFATE) 1 g tablet Take 1 tablet (1 g total) by mouth 4 (four) times daily -  with meals and at bedtime. (Patient not taking: No sig reported)  . VELPHORO 500 MG chewable tablet Chew 500 mg by mouth 3 (three) times daily. (Patient not taking: No sig reported)   No current facility-administered medications for this visit. (Other)      REVIEW OF SYSTEMS: ROS    Positive for: Neurological, Genitourinary, Endocrine, Eyes, Psychiatric   Negative for: Constitutional, Gastrointestinal, Skin, Musculoskeletal, HENT, Cardiovascular, Respiratory, Allergic/Imm, Heme/Lymph   Last edited by Doneen Poisson on 05/03/2020  8:01 AM. (History)       ALLERGIES Allergies  Allergen Reactions  . Ibuprofen Other (See Comments)    Unknown reaction  . Penicillins Itching and Other (See Comments)  Has patient had a PCN reaction causing immediate rash, facial/tongue/throat swelling, SOB or lightheadedness with hypotension: No Has patient had a PCN reaction causing severe rash involving mucus membranes or skin necrosis: No Has patient had a PCN reaction that required hospitalization No Has patient had a PCN reaction occurring within the last 10 years: Unknown If all of the above answers are "NO", then may proceed with Cephalosporin use.    PAST MEDICAL HISTORY Past Medical History:  Diagnosis Date  .  Diabetes mellitus without complication ([place])   . ESRD (end stage renal disease) ([place])   . GERD (gastroesophageal reflux disease)    PMH  . Hypertension   . Low back pain   . Metabolic acidosis   . Neuromuscular disorder ([place])    peripheral neuropathy  . Pancreatitis   . Schizophrenia ([place])    does not take medications   Past Surgical History:  Procedure Laterality Date  . A/V FISTULAGRAM Left 06/03/2018   Procedure: A/V FISTULAGRAM;  Surgeon: Marty Heck, MD;  Location: [facility];  Service: Cardiovascular;  Laterality: Left;  . AV FISTULA PLACEMENT Left 02/11/2018   Procedure: INSERTION OF ARTERIOVENOUS (AV) FISTULA LEFT  ARM;  Surgeon: [name], MD;  Location: [place];  Service: Vascular;  Laterality: Left;  . BASCILIC VEIN TRANSPOSITION Left 04/17/2018   Procedure: BASILIC VEIN TRANSPOSITION SECOND STAGE;  Surgeon: [name], MD;  Location: [place];  Service: Vascular;  Laterality: Left;  . BIOPSY  10/20/2019   Procedure: BIOPSY;  Surgeon: Clarene Essex, MD;  Location: [facility];  Service: Endoscopy;;  . COLONOSCOPY    . DENTAL SURGERY    . ESOPHAGOGASTRODUODENOSCOPY (EGD) WITH PROPOFOL N/A 10/20/2019   Procedure: ESOPHAGOGASTRODUODENOSCOPY (EGD) WITH PROPOFOL;  Surgeon: Clarene Essex, MD;  Location: [place];  Service: Endoscopy;  Laterality: N/A;  . INSERTION OF DIALYSIS CATHETER Right 02/25/2018   Procedure: INSERTION OF DIALYSIS CATHETER;  Surgeon: [name], MD;  Location: [place];  Service: Vascular;  Laterality: Right;  . PERIPHERAL VASCULAR BALLOON ANGIOPLASTY  06/03/2018   Procedure: PERIPHERAL VASCULAR BALLOON ANGIOPLASTY;  Surgeon: Marty Heck, MD;  Location: [facility];  Service: Cardiovascular;;  left a/v fistula    FAMILY HISTORY Family History  Problem Relation Age of Onset  . Kidney failure Mother   . Diabetes Father   . Blindness Brother     SOCIAL HISTORY Social History   Tobacco  Use  . Smoking status: Former Research scientist (life sciences)  . Smokeless tobacco: Never Used  Vaping Use  . Vaping Use: Never used  Substance Use Topics  . Alcohol use: No  . Drug use: Not Currently    Types: Cocaine, Marijuana    Comment: smoked marijuana a few days ago; he denies using cocaine         OPHTHALMIC EXAM:  Base Eye Exam    Visual Acuity (Snellen - Linear)      Right Left   Dist [place] 20/30 +1 20/20 -2   Dist ph [place] 20/25 -2        Tonometry (Tonopen, 8:04 AM)      Right Left   Pressure 22 20       Pupils      Dark Light Shape React APD   Right 2 1 Round Minimal 0   Left 2 1 Round Minimal 0       Visual Fields      Left Right    Full Full  Extraocular Movement      Right Left    Full Full       Neuro/Psych    Oriented x3: Yes   Mood/Affect: Normal       Dilation    Both eyes: 1.0% Mydriacyl, 2.5% Phenylephrine @ 8:04 AM        Slit Lamp and Fundus Exam    Slit Lamp Exam      Right Left   Lids/Lashes Dermatochalasis - upper lid Dermatochalasis - upper lid   Conjunctiva/Sclera Melanosis Melanosis, temporal pinguecula   Cornea 1+ Punctate epithelial erosions, mild arcus 1+ Punctate epithelial erosions, mild arcus   Anterior Chamber Deep and quiet Deep and quiet   Iris Round and dilated, No NVI Round and dilated, No NVI   Lens 2-3+ Nuclear sclerosis, 2-3+ Cortical cataract 2-3+ Nuclear sclerosis, 2-3+ Cortical cataract   Vitreous Vitreous syneresis, +RBC, blood stained vitreous condensations now white, clearing centrally and settling inferiorly, white blood clots settled inferiorly Vitreous syneresis, +RBC       Fundus Exam      Right Left   Disc Mild Pallor, Sharp rim, focal PPP Mild Pallor, Sharp rim   C/D Ratio 0.5 0.4   Macula Flat, Good foveal reflex, RPE mottling and clumping, no edema, trace MA Flat, Blunted foveal reflex, RPE mottling and clumping, scattered Microaneurysms/DBH greatest temporal macula   Vessels attenuated, Tortuous, +NV nasal  midzone attenuated, Tortuous, +NV nasal to disc   Periphery Attached, 360 IRH/DBH, 360 PRP Attached, scattered IRH/DBH greatest nasally          IMAGING AND PROCEDURES  Imaging and Procedures for 05/03/2020  OCT, Retina - OU - Both Eyes       Right Eye Quality was good. Central Foveal Thickness: 239. Progression has improved. Findings include no IRF, no SRF, normal foveal contour (interval improvement in vitreous opacities, irregular lamination, mild atrophy).   Left Eye Quality was good. Central Foveal Thickness: 236. Progression has improved. Findings include normal foveal contour, no SRF, no IRF (Interval improvement in trace cystic changes temporal macula; Partial PVD).   Notes *Images captured and stored on drive  Diagnosis / Impression:  OD: NFP; no IRF/SRF; interval improvement in vitreous opacities, irregular lamination, mild atrophy OS: NFP, no IRF/SRF; Interval improvement in trace cystic changes temporal macula; Partial PVD  Clinical management:  See below  Abbreviations: NFP - Normal foveal profile. CME - cystoid macular edema. PED - pigment epithelial detachment. IRF - intraretinal fluid. SRF - subretinal fluid. EZ - ellipsoid zone. ERM - epiretinal membrane. ORA - outer retinal atrophy. ORT - outer retinal tubulation. SRHM - subretinal hyper-reflective material. IRHM - intraretinal hyper-reflective material        Intravitreal Injection, Pharmacologic Agent - OD - Right Eye       Time Out 05/03/2020. 8:25 AM. Confirmed correct patient, procedure, site, and patient consented.   Anesthesia Topical anesthesia was used. Anesthetic medications included Lidocaine 2%, Proparacaine 0.5%.   Procedure Preparation included 5% betadine to ocular surface, eyelid speculum. A supplied needle was used.   Injection:  1.25 mg Bevacizumab (AVASTIN) 1.'25mg'$ /0.47m SOLN   NDC: [id number] Lot: [id number]'$ , Expiration date: 05/31/2020   Route: Intravitreal, Site: Right Eye,  Waste: 0 mL  Post-op Post injection exam found visual acuity of at least counting fingers. The patient tolerated the procedure well. There were no complications. The patient received written and verbal post procedure care education.        Intravitreal Injection, Pharmacologic Agent - OS - Left  Eye       Time Out 05/03/2020. 8:26 AM. Confirmed correct patient, procedure, site, and patient consented.   Anesthesia Topical anesthesia was used. Anesthetic medications included Lidocaine 2%, Proparacaine 0.5%.   Procedure Preparation included 5% betadine to ocular surface, eyelid speculum. A (32g) needle was used.   Injection:  1.25 mg Bevacizumab (AVASTIN) 1.'25mg'$ /0.66m SOLN   NDC: [id number] Lot: [id number]'$ , Expiration date: 05/24/2020   Route: Intravitreal, Site: Left Eye, Waste: 0 mL  Post-op Post injection exam found visual acuity of at least counting fingers. The patient tolerated the procedure well. There were no complications. The patient received written and verbal post procedure care education.                 ASSESSMENT/PLAN:    ICD-10-CM   1. Proliferative diabetic retinopathy of both eyes with macular edema associated with type 2 diabetes mellitus (HCC)  E16/2/2022Intravitreal Injection, Pharmacologic Agent - OD - Right Eye    Intravitreal Injection, Pharmacologic Agent - OS - Left Eye    Bevacizumab (AVASTIN) SOLN 1.25 mg    Bevacizumab (AVASTIN) SOLN 1.25 mg  2. Retinal edema  H35.81 OCT, Retina - OU - Both Eyes  3. Vitreous hemorrhage, right eye ([place]  H43.11   4. Essential hypertension  I10   5. Hypertensive retinopathy of both eyes  H35.033   6. Combined forms of age-related cataract of both eyes  H25.813     1,2. Proliferative diabetic retinopathy OU (OD > OS) - s/p IVA OD #1 (12.08.21) - s/p IVA OS #1 (12.10.21) - s/p IVA OU #2 (01.11.22) - s/p PRP OD (01.26.22) - exam shows preretinal/subhyaloid heme and VH OD improving, OS with just scattered MA,  IRH - FA (12.08.21) shows late-leaking MA, vascular nonperfusion, +NV OU (nasal midzone) -- would benefit from PRP OU - OCT with interval improvement in trace cystic changes OS; diffuse atrophy/thinning OU - recommend IVA OU #3 today, 02.09.22 - pt wishes to proceed with injections  - RBA of procedure discussed, questions answered  - IVA informed consents signed - see procedure note - f/u 4 weeks, DFE, OCT, PRP OS  3. Vitreous Hemorrhage OD -- improving  - acute onset with violent coughing spell  - underlying etiology related to NV/PDR as described above, but contributed by use of heparin for dialysis  - VH clearing  - s/p PRP OD (1.26.22)  - VH precautions reviewed -- minimize activities, keep head elevated, avoid ASA/NSAIDs/blood thinners as able  - monitor  4,5. Hypertensive retinopathy OU - discussed importance of tight BP control - monitor  6. Mixed Cataract OU - The symptoms of cataract, surgical options, and treatments and risks were discussed with patient. - discussed diagnosis and progression - not yet visually significant - monitor for now  Ophthalmic Meds Ordered this visit:  Meds ordered this encounter  Medications  . Bevacizumab (AVASTIN) SOLN 1.25 mg  . Bevacizumab (AVASTIN) SOLN 1.25 mg      Return in about 4 weeks (around 05/31/2020) for f/u PDR OU, DFE, OCT, PRP OS.  There are no Patient Instructions on file for this visit.  Explained the diagnoses, plan, and follow up with the patient and they expressed understanding.  Patient expressed understanding of the importance of proper follow up care.   This document serves as a record of services personally performed by B16/11/2020 MD, PhD. It was created on their behalf by DGardiner Sleeper COMT. The creation of this record is the provider's dictation and/or activities  during the visit.  Electronically signed by: Roselee Nova, COMT 05/03/20 9:27 AM   This document serves as a record of services personally  performed by Gardiner Sleeper, MD, PhD. It was created on their behalf by San Jetty. Owens Shark, OA an ophthalmic technician. The creation of this record is the provider's dictation and/or activities during the visit.    Electronically signed by: San Jetty. Owens Shark, [place] 02.09.2022 9:27 AM  Gardiner Sleeper, M.D., Ph.D. Diseases & Surgery of the Retina and Vitreous [facility]  I have reviewed the above documentation for accuracy and completeness, and I agree with the above. Gardiner Sleeper, M.D., Ph.D. 05/03/20 9:27 AM   Abbreviations: M myopia (nearsighted); A astigmatism; H hyperopia (farsighted); P presbyopia; Mrx spectacle prescription;  CTL contact lenses; OD right eye; OS left eye; OU both eyes  XT exotropia; ET esotropia; PEK punctate epithelial keratitis; PEE punctate epithelial erosions; DES dry eye syndrome; MGD meibomian gland dysfunction; ATs artificial tears; PFAT's preservative free artificial tears; [place] nuclear sclerotic cataract; PSC posterior subcapsular cataract; ERM epi-retinal membrane; PVD posterior vitreous detachment; RD retinal detachment; DM diabetes mellitus; DR diabetic retinopathy; NPDR non-proliferative diabetic retinopathy; PDR proliferative diabetic retinopathy; CSME clinically significant macular edema; DME diabetic macular edema; dbh dot blot hemorrhages; CWS cotton wool spot; POAG primary open angle glaucoma; C/D cup-to-disc ratio; HVF humphrey visual field; GVF goldmann visual field; OCT optical coherence tomography; IOP intraocular pressure; BRVO Branch retinal vein occlusion; CRVO central retinal vein occlusion; CRAO central retinal artery occlusion; BRAO branch retinal artery occlusion; RT retinal tear; SB scleral buckle; PPV pars plana vitrectomy; VH Vitreous hemorrhage; PRP panretinal laser photocoagulation; IVK intravitreal kenalog; VMT vitreomacular traction; MH Macular hole;  NVD neovascularization of the disc; NVE neovascularization elsewhere; AREDS  age related eye disease study; ARMD age related macular degeneration; POAG primary open angle glaucoma; EBMD epithelial/anterior basement membrane dystrophy; ACIOL anterior chamber intraocular lens; IOL intraocular lens; PCIOL posterior chamber intraocular lens; Phaco/IOL phacoemulsification with intraocular lens placement; [place] photorefractive keratectomy; LASIK laser assisted in situ keratomileusis; HTN hypertension; DM diabetes mellitus; COPD chronic obstructive pulmonary disease

## 2020-05-02 DIAGNOSIS — Z992 Dependence on renal dialysis: Secondary | ICD-10-CM | POA: Diagnosis not present

## 2020-05-02 DIAGNOSIS — E1129 Type 2 diabetes mellitus with other diabetic kidney complication: Secondary | ICD-10-CM | POA: Diagnosis not present

## 2020-05-02 DIAGNOSIS — R52 Pain, unspecified: Secondary | ICD-10-CM | POA: Diagnosis not present

## 2020-05-02 DIAGNOSIS — D689 Coagulation defect, unspecified: Secondary | ICD-10-CM | POA: Diagnosis not present

## 2020-05-02 DIAGNOSIS — N2581 Secondary hyperparathyroidism of renal origin: Secondary | ICD-10-CM | POA: Diagnosis not present

## 2020-05-02 DIAGNOSIS — N186 End stage renal disease: Secondary | ICD-10-CM | POA: Diagnosis not present

## 2020-05-03 ENCOUNTER — Encounter (INDEPENDENT_AMBULATORY_CARE_PROVIDER_SITE_OTHER): Payer: Self-pay | Admitting: Ophthalmology

## 2020-05-03 ENCOUNTER — Ambulatory Visit (INDEPENDENT_AMBULATORY_CARE_PROVIDER_SITE_OTHER): Payer: Medicare Other | Admitting: Ophthalmology

## 2020-05-03 ENCOUNTER — Other Ambulatory Visit: Payer: Self-pay

## 2020-05-03 DIAGNOSIS — H35033 Hypertensive retinopathy, bilateral: Secondary | ICD-10-CM

## 2020-05-03 DIAGNOSIS — H4311 Vitreous hemorrhage, right eye: Secondary | ICD-10-CM

## 2020-05-03 DIAGNOSIS — H25813 Combined forms of age-related cataract, bilateral: Secondary | ICD-10-CM | POA: Diagnosis not present

## 2020-05-03 DIAGNOSIS — E113513 Type 2 diabetes mellitus with proliferative diabetic retinopathy with macular edema, bilateral: Secondary | ICD-10-CM

## 2020-05-03 DIAGNOSIS — I10 Essential (primary) hypertension: Secondary | ICD-10-CM | POA: Diagnosis not present

## 2020-05-03 DIAGNOSIS — H3581 Retinal edema: Secondary | ICD-10-CM | POA: Diagnosis not present

## 2020-05-03 MED ORDER — BEVACIZUMAB CHEMO INJECTION 1.25MG/0.05ML SYRINGE FOR KALEIDOSCOPE
1.2500 mg | INTRAVITREAL | Status: AC | PRN
  Administered 2020-05-03: 1.25 mg via INTRAVITREAL

## 2020-05-03 MED ORDER — BEVACIZUMAB CHEMO INJECTION 1.25MG/0.05ML SYRINGE FOR KALEIDOSCOPE
1.2500 mg | INTRAVITREAL | Status: AC | PRN
Start: 2020-05-03 — End: 2020-05-03
  Administered 2020-05-03: 1.25 mg via INTRAVITREAL

## 2020-05-04 DIAGNOSIS — N186 End stage renal disease: Secondary | ICD-10-CM | POA: Diagnosis not present

## 2020-05-04 DIAGNOSIS — D689 Coagulation defect, unspecified: Secondary | ICD-10-CM | POA: Diagnosis not present

## 2020-05-04 DIAGNOSIS — R52 Pain, unspecified: Secondary | ICD-10-CM | POA: Diagnosis not present

## 2020-05-04 DIAGNOSIS — E1129 Type 2 diabetes mellitus with other diabetic kidney complication: Secondary | ICD-10-CM | POA: Diagnosis not present

## 2020-05-04 DIAGNOSIS — N2581 Secondary hyperparathyroidism of renal origin: Secondary | ICD-10-CM | POA: Diagnosis not present

## 2020-05-04 DIAGNOSIS — Z992 Dependence on renal dialysis: Secondary | ICD-10-CM | POA: Diagnosis not present

## 2020-05-06 DIAGNOSIS — E1129 Type 2 diabetes mellitus with other diabetic kidney complication: Secondary | ICD-10-CM | POA: Diagnosis not present

## 2020-05-06 DIAGNOSIS — D689 Coagulation defect, unspecified: Secondary | ICD-10-CM | POA: Diagnosis not present

## 2020-05-06 DIAGNOSIS — R52 Pain, unspecified: Secondary | ICD-10-CM | POA: Diagnosis not present

## 2020-05-06 DIAGNOSIS — N2581 Secondary hyperparathyroidism of renal origin: Secondary | ICD-10-CM | POA: Diagnosis not present

## 2020-05-06 DIAGNOSIS — N186 End stage renal disease: Secondary | ICD-10-CM | POA: Diagnosis not present

## 2020-05-06 DIAGNOSIS — Z992 Dependence on renal dialysis: Secondary | ICD-10-CM | POA: Diagnosis not present

## 2020-05-09 DIAGNOSIS — Z992 Dependence on renal dialysis: Secondary | ICD-10-CM | POA: Diagnosis not present

## 2020-05-09 DIAGNOSIS — N2581 Secondary hyperparathyroidism of renal origin: Secondary | ICD-10-CM | POA: Diagnosis not present

## 2020-05-09 DIAGNOSIS — D689 Coagulation defect, unspecified: Secondary | ICD-10-CM | POA: Diagnosis not present

## 2020-05-09 DIAGNOSIS — E1129 Type 2 diabetes mellitus with other diabetic kidney complication: Secondary | ICD-10-CM | POA: Diagnosis not present

## 2020-05-09 DIAGNOSIS — R52 Pain, unspecified: Secondary | ICD-10-CM | POA: Diagnosis not present

## 2020-05-09 DIAGNOSIS — N186 End stage renal disease: Secondary | ICD-10-CM | POA: Diagnosis not present

## 2020-05-11 DIAGNOSIS — D689 Coagulation defect, unspecified: Secondary | ICD-10-CM | POA: Diagnosis not present

## 2020-05-11 DIAGNOSIS — N186 End stage renal disease: Secondary | ICD-10-CM | POA: Diagnosis not present

## 2020-05-11 DIAGNOSIS — Z992 Dependence on renal dialysis: Secondary | ICD-10-CM | POA: Diagnosis not present

## 2020-05-11 DIAGNOSIS — R52 Pain, unspecified: Secondary | ICD-10-CM | POA: Diagnosis not present

## 2020-05-11 DIAGNOSIS — E1129 Type 2 diabetes mellitus with other diabetic kidney complication: Secondary | ICD-10-CM | POA: Diagnosis not present

## 2020-05-11 DIAGNOSIS — N2581 Secondary hyperparathyroidism of renal origin: Secondary | ICD-10-CM | POA: Diagnosis not present

## 2020-05-16 DIAGNOSIS — R52 Pain, unspecified: Secondary | ICD-10-CM | POA: Diagnosis not present

## 2020-05-16 DIAGNOSIS — N2581 Secondary hyperparathyroidism of renal origin: Secondary | ICD-10-CM | POA: Diagnosis not present

## 2020-05-16 DIAGNOSIS — D689 Coagulation defect, unspecified: Secondary | ICD-10-CM | POA: Diagnosis not present

## 2020-05-16 DIAGNOSIS — N186 End stage renal disease: Secondary | ICD-10-CM | POA: Diagnosis not present

## 2020-05-16 DIAGNOSIS — E1129 Type 2 diabetes mellitus with other diabetic kidney complication: Secondary | ICD-10-CM | POA: Diagnosis not present

## 2020-05-16 DIAGNOSIS — Z992 Dependence on renal dialysis: Secondary | ICD-10-CM | POA: Diagnosis not present

## 2020-05-18 DIAGNOSIS — R52 Pain, unspecified: Secondary | ICD-10-CM | POA: Diagnosis not present

## 2020-05-18 DIAGNOSIS — E1129 Type 2 diabetes mellitus with other diabetic kidney complication: Secondary | ICD-10-CM | POA: Diagnosis not present

## 2020-05-18 DIAGNOSIS — N2581 Secondary hyperparathyroidism of renal origin: Secondary | ICD-10-CM | POA: Diagnosis not present

## 2020-05-18 DIAGNOSIS — Z992 Dependence on renal dialysis: Secondary | ICD-10-CM | POA: Diagnosis not present

## 2020-05-18 DIAGNOSIS — D689 Coagulation defect, unspecified: Secondary | ICD-10-CM | POA: Diagnosis not present

## 2020-05-18 DIAGNOSIS — N186 End stage renal disease: Secondary | ICD-10-CM | POA: Diagnosis not present

## 2020-05-20 DIAGNOSIS — E1129 Type 2 diabetes mellitus with other diabetic kidney complication: Secondary | ICD-10-CM | POA: Diagnosis not present

## 2020-05-20 DIAGNOSIS — Z992 Dependence on renal dialysis: Secondary | ICD-10-CM | POA: Diagnosis not present

## 2020-05-20 DIAGNOSIS — N186 End stage renal disease: Secondary | ICD-10-CM | POA: Diagnosis not present

## 2020-05-20 DIAGNOSIS — R52 Pain, unspecified: Secondary | ICD-10-CM | POA: Diagnosis not present

## 2020-05-20 DIAGNOSIS — N2581 Secondary hyperparathyroidism of renal origin: Secondary | ICD-10-CM | POA: Diagnosis not present

## 2020-05-20 DIAGNOSIS — D689 Coagulation defect, unspecified: Secondary | ICD-10-CM | POA: Diagnosis not present

## 2020-05-22 DIAGNOSIS — N186 End stage renal disease: Secondary | ICD-10-CM | POA: Diagnosis not present

## 2020-05-22 DIAGNOSIS — Z992 Dependence on renal dialysis: Secondary | ICD-10-CM | POA: Diagnosis not present

## 2020-05-22 DIAGNOSIS — E1122 Type 2 diabetes mellitus with diabetic chronic kidney disease: Secondary | ICD-10-CM | POA: Diagnosis not present

## 2020-05-23 DIAGNOSIS — R52 Pain, unspecified: Secondary | ICD-10-CM | POA: Diagnosis not present

## 2020-05-23 DIAGNOSIS — N186 End stage renal disease: Secondary | ICD-10-CM | POA: Diagnosis not present

## 2020-05-23 DIAGNOSIS — N2581 Secondary hyperparathyroidism of renal origin: Secondary | ICD-10-CM | POA: Diagnosis not present

## 2020-05-23 DIAGNOSIS — Z992 Dependence on renal dialysis: Secondary | ICD-10-CM | POA: Diagnosis not present

## 2020-05-23 DIAGNOSIS — D689 Coagulation defect, unspecified: Secondary | ICD-10-CM | POA: Diagnosis not present

## 2020-05-25 DIAGNOSIS — Z992 Dependence on renal dialysis: Secondary | ICD-10-CM | POA: Diagnosis not present

## 2020-05-25 DIAGNOSIS — R52 Pain, unspecified: Secondary | ICD-10-CM | POA: Diagnosis not present

## 2020-05-25 DIAGNOSIS — N2581 Secondary hyperparathyroidism of renal origin: Secondary | ICD-10-CM | POA: Diagnosis not present

## 2020-05-25 DIAGNOSIS — N186 End stage renal disease: Secondary | ICD-10-CM | POA: Diagnosis not present

## 2020-05-25 DIAGNOSIS — D689 Coagulation defect, unspecified: Secondary | ICD-10-CM | POA: Diagnosis not present

## 2020-05-26 DIAGNOSIS — N186 End stage renal disease: Secondary | ICD-10-CM | POA: Diagnosis not present

## 2020-05-26 DIAGNOSIS — E103513 Type 1 diabetes mellitus with proliferative diabetic retinopathy with macular edema, bilateral: Secondary | ICD-10-CM | POA: Diagnosis not present

## 2020-05-26 DIAGNOSIS — E1022 Type 1 diabetes mellitus with diabetic chronic kidney disease: Secondary | ICD-10-CM | POA: Diagnosis not present

## 2020-05-26 DIAGNOSIS — Z992 Dependence on renal dialysis: Secondary | ICD-10-CM | POA: Diagnosis not present

## 2020-05-27 DIAGNOSIS — Z992 Dependence on renal dialysis: Secondary | ICD-10-CM | POA: Diagnosis not present

## 2020-05-27 DIAGNOSIS — N2581 Secondary hyperparathyroidism of renal origin: Secondary | ICD-10-CM | POA: Diagnosis not present

## 2020-05-27 DIAGNOSIS — D689 Coagulation defect, unspecified: Secondary | ICD-10-CM | POA: Diagnosis not present

## 2020-05-27 DIAGNOSIS — R52 Pain, unspecified: Secondary | ICD-10-CM | POA: Diagnosis not present

## 2020-05-27 DIAGNOSIS — N186 End stage renal disease: Secondary | ICD-10-CM | POA: Diagnosis not present

## 2020-05-30 DIAGNOSIS — N2581 Secondary hyperparathyroidism of renal origin: Secondary | ICD-10-CM | POA: Diagnosis not present

## 2020-05-30 DIAGNOSIS — R52 Pain, unspecified: Secondary | ICD-10-CM | POA: Diagnosis not present

## 2020-05-30 DIAGNOSIS — N186 End stage renal disease: Secondary | ICD-10-CM | POA: Diagnosis not present

## 2020-05-30 DIAGNOSIS — D689 Coagulation defect, unspecified: Secondary | ICD-10-CM | POA: Diagnosis not present

## 2020-05-30 DIAGNOSIS — Z992 Dependence on renal dialysis: Secondary | ICD-10-CM | POA: Diagnosis not present

## 2020-05-30 NOTE — Progress Notes (Signed)
[facility] - Clinic Note  05/31/2020     CHIEF COMPLAINT Patient presents for Retina Follow Up   HISTORY OF PRESENT ILLNESS: Jonathan Mcneil is a 65 y.o. male who presents to the clinic today for:   HPI    Retina Follow Up    Patient presents with  Diabetic Retinopathy.  In both eyes.  This started weeks ago.  Severity is moderate.  Duration of weeks.  I, the attending physician,  performed the HPI with the patient and updated documentation appropriately.          Comments    Pt states he is noticing continuous improvement in his vision OU.  Pt denies eye pain or discomfort.  Pt states he still has floaters OU--same amount of floaters.  Denies any new or worsening floaters or fol OU.       Last edited by Rennis Chris, MD on 05/31/2020 12:24 PM. (History)    pt states no changes in vision, he is trying to sleep with his head elevated  Referring physician: Georgianne Fick, MD [address] [place],  [place] [address]  HISTORICAL INFORMATION:   Selected notes from the MEDICAL RECORD NUMBER Referred by Dr. Aletta Edouard for heme OD   CURRENT MEDICATIONS: No current outpatient medications on file. (Ophthalmic Drugs)   No current facility-administered medications for this visit. (Ophthalmic Drugs)   Current Outpatient Medications (Other)  Medication Sig  . acetaminophen (TYLENOL) 650 MG CR tablet Take 1,300 mg by mouth 2 (two) times daily as needed for pain.  Marland Kitchen amLODipine (NORVASC) 10 MG tablet Take 10 mg by mouth at bedtime.   Marland Kitchen azithromycin (ZITHROMAX Z-PAK) 250 MG tablet Take 1 tablet (250 mg total) by mouth daily. (Patient not taking: No sig reported)  . benzonatate (TESSALON) 100 MG capsule Take 1 capsule (100 mg total) by mouth every 8 (eight) hours. (Patient not taking: No sig reported)  . calcitRIOL (ROCALTROL) 0.25 MCG capsule Take 1 capsule (0.25 mcg total) by mouth every Monday, Wednesday, and Friday with hemodialysis. (Patient not  taking: No sig reported)  . Multiple Vitamins-Minerals (MULTIVITAMIN) LIQD Take 32 oz by mouth daily. (Patient not taking: Reported on 04/04/2020)  . pantoprazole (PROTONIX) 20 MG tablet Take 1 tablet (20 mg total) by mouth daily. (Patient not taking: No sig reported)  . pantoprazole (PROTONIX) 40 MG tablet Take 40 mg by mouth daily. (Patient not taking: No sig reported)  . promethazine (PHENERGAN) 25 MG suppository Place 1 suppository (25 mg total) rectally every 8 (eight) hours as needed for nausea or vomiting. (Patient not taking: No sig reported)  . promethazine (PHENERGAN) 25 MG tablet Take 25 mg by mouth 3 (three) times daily. (Patient not taking: No sig reported)  . sucralfate (CARAFATE) 1 g tablet Take 1 tablet (1 g total) by mouth 4 (four) times daily -  with meals and at bedtime. (Patient not taking: No sig reported)  . VELPHORO 500 MG chewable tablet Chew 500 mg by mouth 3 (three) times daily. (Patient not taking: No sig reported)   No current facility-administered medications for this visit. (Other)      REVIEW OF SYSTEMS: ROS    Positive for: Neurological, Genitourinary, Endocrine, Eyes, Psychiatric   Negative for: Constitutional, Gastrointestinal, Skin, Musculoskeletal, HENT, Cardiovascular, Respiratory, Allergic/Imm, Heme/Lymph   Last edited by Corrinne Eagle on 05/31/2020  9:50 AM. (History)       ALLERGIES Allergies  Allergen Reactions  . Ibuprofen Other (See  Comments)    Unknown reaction  . Penicillins Itching and Other (See Comments)    Has patient had a PCN reaction causing immediate rash, facial/tongue/throat swelling, SOB or lightheadedness with hypotension: No Has patient had a PCN reaction causing severe rash involving mucus membranes or skin necrosis: No Has patient had a PCN reaction that required hospitalization No Has patient had a PCN reaction occurring within the last 10 years: Unknown If all of the above answers are "NO", then may proceed with  Cephalosporin use.    PAST MEDICAL HISTORY Past Medical History:  Diagnosis Date  . Diabetes mellitus without complication (HCC)   . ESRD (end stage renal disease) (HCC)   . GERD (gastroesophageal reflux disease)    PMH  . Hypertension   . Low back pain   . Metabolic acidosis   . Neuromuscular disorder (HCC)    peripheral neuropathy  . Pancreatitis   . Schizophrenia (HCC)    does not take medications   Past Surgical History:  Procedure Laterality Date  . A/V FISTULAGRAM Left 06/03/2018   Procedure: A/V FISTULAGRAM;  Surgeon: Cephus Shelling, MD;  Location: [facility] INVASIVE CV LAB;  Service: Cardiovascular;  Laterality: Left;  . AV FISTULA PLACEMENT Left 02/11/2018   Procedure: INSERTION OF ARTERIOVENOUS (AV) FISTULA LEFT  ARM;  Surgeon: Maeola Harman, MD;  Location: [facility] OR;  Service: Vascular;  Laterality: Left;  . BASCILIC VEIN TRANSPOSITION Left 04/17/2018   Procedure: BASILIC VEIN TRANSPOSITION SECOND STAGE;  Surgeon: Maeola Harman, MD;  Location: [facility] OR;  Service: Vascular;  Laterality: Left;  . BIOPSY  10/20/2019   Procedure: BIOPSY;  Surgeon: Vida Rigger, MD;  Location: [facility] ENDOSCOPY;  Service: Endoscopy;;  . COLONOSCOPY    . DENTAL SURGERY    . ESOPHAGOGASTRODUODENOSCOPY (EGD) WITH PROPOFOL N/A 10/20/2019   Procedure: ESOPHAGOGASTRODUODENOSCOPY (EGD) WITH PROPOFOL;  Surgeon: Vida Rigger, MD;  Location[facility] ENDOSCOPY;  Service: Endoscopy;  Laterality: N/A;  . INSERTION OF DIALYSIS CATHETER Right 02/25/2018   Procedure: INSERTION OF DIALYSIS CATHETER;  Surgeon: Maeola Harman, MD;  Location: [facility] OR;  Service: Vascular;  Laterality: Right;  . PERIPHERAL VASCULAR BALLOON ANGIOPLASTY  06/03/2018   Procedure: PERIPHERAL VASCULAR BALLOON ANGIOPLASTY;  Surgeon: Cephus Shelling, MD;  Location: [facility] INVASIVE CV LAB;  Service: Cardiovascular;;  left a/v fistula    FAMILY HISTORY Family History  Problem Relation Age of Onset  . Kidney failure Mother    . Diabetes Father   . Blindness Brother     SOCIAL HISTORY Social History   Tobacco Use  . Smoking status: Former Games developer  . Smokeless tobacco: Never Used  Vaping Use  . Vaping Use: Never used  Substance Use Topics  . Alcohol use: No  . Drug use: Not Currently    Types: Cocaine, Marijuana    Comment: smoked marijuana a few days ago; he denies using cocaine         OPHTHALMIC EXAM:  Base Eye Exam    Visual Acuity (Snellen - Linear)      Right Left   Dist [place] 20/30 -1 20/20 -2   Dist ph [place] NI    Correction: Glasses       Tonometry (Tonopen, 9:53 AM)      Right Left   Pressure 19 19       Pupils      Dark Light Shape React APD   Right 3 2 Round Minimal 0   Left 3 2 Round Minimal 0  Visual Fields      Left Right    Full Full       Extraocular Movement      Right Left    Full Full       Neuro/Psych    Oriented x3: Yes   Mood/Affect: Normal       Dilation    Both eyes: 1.0% Mydriacyl, 2.5% Phenylephrine @ 9:53 AM        Slit Lamp and Fundus Exam    Slit Lamp Exam      Right Left   Lids/Lashes Dermatochalasis - upper lid Dermatochalasis - upper lid   Conjunctiva/Sclera Melanosis Melanosis, temporal pinguecula   Cornea 1+ Punctate epithelial erosions, mild arcus 1+ Punctate epithelial erosions, mild arcus   Anterior Chamber Deep and quiet Deep and quiet   Iris Round and dilated, No NVI Round and dilated, No NVI   Lens 2-3+ Nuclear sclerosis, 2-3+ Cortical cataract 2-3+ Nuclear sclerosis, 2-3+ Cortical cataract   Vitreous Vitreous syneresis, +RBC, blood stained vitreous condensations now white, clearing centrally and settling inferiorly, white blood clots settled inferiorly Vitreous syneresis, +RBC       Fundus Exam      Right Left   Disc Mild Pallor, Sharp rim, focal PPP Mild Pallor, Sharp rim   C/D Ratio 0.5 0.4   Macula Flat, Good foveal reflex, RPE mottling and clumping, no edema, trace MA Flat, Blunted foveal reflex, RPE mottling and  clumping, scattered Microaneurysms/DBH greatest temporal macula   Vessels attenuated, Tortuous, +NV nasal midzone attenuated, Tortuous, +NV nasal to disc   Periphery Attached, 360 IRH/DBH, 360 PRP with room for fill in Attached, scattered IRH/DBH greatest nasally          IMAGING AND PROCEDURES  Imaging and Procedures for 05/31/2020  OCT, Retina - OU - Both Eyes       Right Eye Quality was good. Central Foveal Thickness: 235. Progression has improved. Findings include no IRF, no SRF, normal foveal contour, intraretinal hyper-reflective material (Mild interval improvement in vitreous opacities, irregular lamination, mild atrophy).   Left Eye Quality was good. Central Foveal Thickness: 228. Progression has improved. Findings include normal foveal contour, no SRF, no IRF (Interval improvement in trace cystic changes temporal macula; Partial PVD).   Notes *Images captured and stored on drive  Diagnosis / Impression:  OD: NFP; no IRF/SRF; interval improvement in vitreous opacities; irregular lamination, mild atrophy OS: NFP, no IRF/SRF; Interval improvement in trace cystic changes temporal macula; Partial PVD  Clinical management:  See below  Abbreviations: NFP - Normal foveal profile. CME - cystoid macular edema. PED - pigment epithelial detachment. IRF - intraretinal fluid. SRF - subretinal fluid. EZ - ellipsoid zone. ERM - epiretinal membrane. ORA - outer retinal atrophy. ORT - outer retinal tubulation. SRHM - subretinal hyper-reflective material. IRHM - intraretinal hyper-reflective material        Panretinal Photocoagulation - OS - Left Eye       LASER PROCEDURE NOTE  Diagnosis:   Proliferative Diabetic Retinopathy, LEFT EYE  Procedure:  Pan-retinal photocoagulation using slit lamp laser, LEFT EYE  Anesthesia:  Topical  Surgeon: Rennis Chris, MD, PhD   Informed consent obtained, operative eye marked, and time out performed prior to initiation of laser.   L[employer]is  [id number] slit lamp laser Pattern: 3x3 square Power: 260 mW Duration: 30 msec  Spot size: 200 microns  # spots: 1911 spots  Complications: None.  RTC: 4 wks  Patient tolerated the procedure well and received written and verbal  post-procedure care information/education.                  ASSESSMENT/PLAN:    ICD-10-CM   1. Proliferative diabetic retinopathy of both eyes with macular edema associated with type 2 diabetes mellitus (HCC)  N62.9528 Panretinal Photocoagulation - OS - Left Eye  2. Retinal edema  H35.81 OCT, Retina - OU - Both Eyes  3. Vitreous hemorrhage, right eye (HCC)  H43.11   4. Essential hypertension  I10   5. Hypertensive retinopathy of both eyes  H35.033   6. Combined forms of age-related cataract of both eyes  H25.813     1,2. Proliferative diabetic retinopathy OU (OD > OS) - s/p IVA OD #1 (12.08.21) - s/p IVA OS #1 (12.10.21) - s/p IVA OU #2 (01.11.22), #3 (02.09.22) - s/p PRP OD (01.26.22) - exam shows preretinal/subhyaloid heme and VH OD improving, OS with just scattered MA, IRH - FA (12.08.21) shows late-leaking MA, vascular nonperfusion, +NV OU (nasal midzone) -- would benefit from PRP OU  - OCT with interval improvement in trace cystic changes OS; diffuse atrophy/thinning OU - recommend PRP OS today, 03.09.22 - pt wishes to proceed with laser  - RBA of procedure discussed, questions answered  - IVA informed consents signed - see procedure note - start PF QID OS x7 days - f/u 4 weeks, DFE, OCT  3. Vitreous Hemorrhage OD -- improving  - acute onset with violent coughing spell  - underlying etiology related to NV/PDR as described above, but contributed by use of heparin for dialysis  - VH clearing  - s/p PRP OD (1.26.22)  - VH precautions reviewed -- minimize activities, keep head elevated, avoid ASA/NSAIDs/blood thinners as able  - monitor  4,5. Hypertensive retinopathy OU - discussed importance of tight BP control - monitor  6. Mixed  Cataract OU - The symptoms of cataract, surgical options, and treatments and risks were discussed with patient. - discussed diagnosis and progression - not yet visually significant - monitor for now  Ophthalmic Meds Ordered this visit:  No orders of the defined types were placed in this encounter.     Return in about 4 weeks (around 06/28/2020) for f/u PDR OU, DFE, OCT.  There are no Patient Instructions on file for this visit.  Explained the diagnoses, plan, and follow up with the patient and they expressed understanding.  Patient expressed understanding of the importance of proper follow up care.   This document serves as a record of services personally performed by Karie Chimera, MD, PhD. It was created on their behalf by Annalee Genta, COMT. The creation of this record is the provider's dictation and/or activities during the visit.  Electronically signed by: Annalee Genta, COMT 05/31/20 12:41 PM   This document serves as a record of services personally performed by Karie Chimera, MD, PhD. It was created on their behalf by Glee Arvin. Manson Passey, OA an ophthalmic technician. The creation of this record is the provider's dictation and/or activities during the visit.    Electronically signed by: Glee Arvin. Kristopher Oppenheim 03.09.2022 12:41 PM  Karie Chimera, M.D., Ph.D. Diseases & Surgery of the Retina and Vitreous [facility]  I have reviewed the above documentation for accuracy and completeness, and I agree with the above. Karie Chimera, M.D., Ph.D. 05/31/20 12:41 PM  Abbreviations: M myopia (nearsighted); A astigmatism; H hyperopia (farsighted); P presbyopia; Mrx spectacle prescription;  CTL contact lenses; OD right eye; OS left eye; OU both  eyes  XT exotropia; ET esotropia; PEK punctate epithelial keratitis; PEE punctate epithelial erosions; DES dry eye syndrome; MGD meibomian gland dysfunction; ATs artificial tears; PFAT's preservative free artificial tears; NSC  nuclear sclerotic cataract; PSC posterior subcapsular cataract; ERM epi-retinal membrane; PVD posterior vitreous detachment; RD retinal detachment; DM diabetes mellitus; DR diabetic retinopathy; NPDR non-proliferative diabetic retinopathy; PDR proliferative diabetic retinopathy; CSME clinically significant macular edema; DME diabetic macular edema; dbh dot blot hemorrhages; CWS cotton wool spot; POAG primary open angle glaucoma; C/D cup-to-disc ratio; HVF humphrey visual field; GVF goldmann visual field; OCT optical coherence tomography; IOP intraocular pressure; BRVO Branch retinal vein occlusion; CRVO central retinal vein occlusion; CRAO central retinal artery occlusion; BRAO branch retinal artery occlusion; RT retinal tear; SB scleral buckle; PPV pars plana vitrectomy; VH Vitreous hemorrhage; PRP panretinal laser photocoagulation; IVK intravitreal kenalog; VMT vitreomacular traction; MH Macular hole;  NVD neovascularization of the disc; NVE neovascularization elsewhere; AREDS age related eye disease study; ARMD age related macular degeneration; POAG primary open angle glaucoma; EBMD epithelial/anterior basement membrane dystrophy; ACIOL anterior chamber intraocular lens; IOL intraocular lens; PCIOL posterior chamber intraocular lens; Phaco/IOL phacoemulsification with intraocular lens placement; PRK photorefractive keratectomy; LASIK laser assisted in situ keratomileusis; HTN hypertension; DM diabetes mellitus; COPD chronic obstructive pulmonary disease

## 2020-05-31 ENCOUNTER — Other Ambulatory Visit: Payer: Self-pay

## 2020-05-31 ENCOUNTER — Ambulatory Visit (INDEPENDENT_AMBULATORY_CARE_PROVIDER_SITE_OTHER): Payer: Medicare Other | Admitting: Ophthalmology

## 2020-05-31 ENCOUNTER — Encounter (INDEPENDENT_AMBULATORY_CARE_PROVIDER_SITE_OTHER): Payer: Self-pay | Admitting: Ophthalmology

## 2020-05-31 DIAGNOSIS — H4311 Vitreous hemorrhage, right eye: Secondary | ICD-10-CM

## 2020-05-31 DIAGNOSIS — H35033 Hypertensive retinopathy, bilateral: Secondary | ICD-10-CM

## 2020-05-31 DIAGNOSIS — H3581 Retinal edema: Secondary | ICD-10-CM

## 2020-05-31 DIAGNOSIS — I10 Essential (primary) hypertension: Secondary | ICD-10-CM | POA: Diagnosis not present

## 2020-05-31 DIAGNOSIS — H25813 Combined forms of age-related cataract, bilateral: Secondary | ICD-10-CM | POA: Diagnosis not present

## 2020-05-31 DIAGNOSIS — E113513 Type 2 diabetes mellitus with proliferative diabetic retinopathy with macular edema, bilateral: Secondary | ICD-10-CM

## 2020-06-01 DIAGNOSIS — D689 Coagulation defect, unspecified: Secondary | ICD-10-CM | POA: Diagnosis not present

## 2020-06-01 DIAGNOSIS — N186 End stage renal disease: Secondary | ICD-10-CM | POA: Diagnosis not present

## 2020-06-01 DIAGNOSIS — R52 Pain, unspecified: Secondary | ICD-10-CM | POA: Diagnosis not present

## 2020-06-01 DIAGNOSIS — Z992 Dependence on renal dialysis: Secondary | ICD-10-CM | POA: Diagnosis not present

## 2020-06-01 DIAGNOSIS — N2581 Secondary hyperparathyroidism of renal origin: Secondary | ICD-10-CM | POA: Diagnosis not present

## 2020-06-03 DIAGNOSIS — R52 Pain, unspecified: Secondary | ICD-10-CM | POA: Diagnosis not present

## 2020-06-03 DIAGNOSIS — N186 End stage renal disease: Secondary | ICD-10-CM | POA: Diagnosis not present

## 2020-06-03 DIAGNOSIS — N2581 Secondary hyperparathyroidism of renal origin: Secondary | ICD-10-CM | POA: Diagnosis not present

## 2020-06-03 DIAGNOSIS — D689 Coagulation defect, unspecified: Secondary | ICD-10-CM | POA: Diagnosis not present

## 2020-06-03 DIAGNOSIS — Z992 Dependence on renal dialysis: Secondary | ICD-10-CM | POA: Diagnosis not present

## 2020-06-06 DIAGNOSIS — D689 Coagulation defect, unspecified: Secondary | ICD-10-CM | POA: Diagnosis not present

## 2020-06-06 DIAGNOSIS — N186 End stage renal disease: Secondary | ICD-10-CM | POA: Diagnosis not present

## 2020-06-06 DIAGNOSIS — E1129 Type 2 diabetes mellitus with other diabetic kidney complication: Secondary | ICD-10-CM | POA: Diagnosis not present

## 2020-06-06 DIAGNOSIS — R52 Pain, unspecified: Secondary | ICD-10-CM | POA: Diagnosis not present

## 2020-06-06 DIAGNOSIS — Z992 Dependence on renal dialysis: Secondary | ICD-10-CM | POA: Diagnosis not present

## 2020-06-06 DIAGNOSIS — N2581 Secondary hyperparathyroidism of renal origin: Secondary | ICD-10-CM | POA: Diagnosis not present

## 2020-06-08 DIAGNOSIS — D689 Coagulation defect, unspecified: Secondary | ICD-10-CM | POA: Diagnosis not present

## 2020-06-08 DIAGNOSIS — N186 End stage renal disease: Secondary | ICD-10-CM | POA: Diagnosis not present

## 2020-06-08 DIAGNOSIS — N2581 Secondary hyperparathyroidism of renal origin: Secondary | ICD-10-CM | POA: Diagnosis not present

## 2020-06-08 DIAGNOSIS — Z992 Dependence on renal dialysis: Secondary | ICD-10-CM | POA: Diagnosis not present

## 2020-06-08 DIAGNOSIS — E1129 Type 2 diabetes mellitus with other diabetic kidney complication: Secondary | ICD-10-CM | POA: Diagnosis not present

## 2020-06-08 DIAGNOSIS — R52 Pain, unspecified: Secondary | ICD-10-CM | POA: Diagnosis not present

## 2020-06-10 DIAGNOSIS — Z992 Dependence on renal dialysis: Secondary | ICD-10-CM | POA: Diagnosis not present

## 2020-06-10 DIAGNOSIS — R52 Pain, unspecified: Secondary | ICD-10-CM | POA: Diagnosis not present

## 2020-06-10 DIAGNOSIS — N2581 Secondary hyperparathyroidism of renal origin: Secondary | ICD-10-CM | POA: Diagnosis not present

## 2020-06-10 DIAGNOSIS — N186 End stage renal disease: Secondary | ICD-10-CM | POA: Diagnosis not present

## 2020-06-10 DIAGNOSIS — D689 Coagulation defect, unspecified: Secondary | ICD-10-CM | POA: Diagnosis not present

## 2020-06-10 DIAGNOSIS — E1129 Type 2 diabetes mellitus with other diabetic kidney complication: Secondary | ICD-10-CM | POA: Diagnosis not present

## 2020-06-13 DIAGNOSIS — N186 End stage renal disease: Secondary | ICD-10-CM | POA: Diagnosis not present

## 2020-06-13 DIAGNOSIS — N2581 Secondary hyperparathyroidism of renal origin: Secondary | ICD-10-CM | POA: Diagnosis not present

## 2020-06-13 DIAGNOSIS — R52 Pain, unspecified: Secondary | ICD-10-CM | POA: Diagnosis not present

## 2020-06-13 DIAGNOSIS — Z992 Dependence on renal dialysis: Secondary | ICD-10-CM | POA: Diagnosis not present

## 2020-06-13 DIAGNOSIS — D689 Coagulation defect, unspecified: Secondary | ICD-10-CM | POA: Diagnosis not present

## 2020-06-15 DIAGNOSIS — N2581 Secondary hyperparathyroidism of renal origin: Secondary | ICD-10-CM | POA: Diagnosis not present

## 2020-06-15 DIAGNOSIS — Z992 Dependence on renal dialysis: Secondary | ICD-10-CM | POA: Diagnosis not present

## 2020-06-15 DIAGNOSIS — N186 End stage renal disease: Secondary | ICD-10-CM | POA: Diagnosis not present

## 2020-06-15 DIAGNOSIS — D689 Coagulation defect, unspecified: Secondary | ICD-10-CM | POA: Diagnosis not present

## 2020-06-15 DIAGNOSIS — R52 Pain, unspecified: Secondary | ICD-10-CM | POA: Diagnosis not present

## 2020-06-17 DIAGNOSIS — N186 End stage renal disease: Secondary | ICD-10-CM | POA: Diagnosis not present

## 2020-06-17 DIAGNOSIS — Z992 Dependence on renal dialysis: Secondary | ICD-10-CM | POA: Diagnosis not present

## 2020-06-17 DIAGNOSIS — D689 Coagulation defect, unspecified: Secondary | ICD-10-CM | POA: Diagnosis not present

## 2020-06-17 DIAGNOSIS — R52 Pain, unspecified: Secondary | ICD-10-CM | POA: Diagnosis not present

## 2020-06-17 DIAGNOSIS — N2581 Secondary hyperparathyroidism of renal origin: Secondary | ICD-10-CM | POA: Diagnosis not present

## 2020-06-20 DIAGNOSIS — R52 Pain, unspecified: Secondary | ICD-10-CM | POA: Diagnosis not present

## 2020-06-20 DIAGNOSIS — D689 Coagulation defect, unspecified: Secondary | ICD-10-CM | POA: Diagnosis not present

## 2020-06-20 DIAGNOSIS — N2581 Secondary hyperparathyroidism of renal origin: Secondary | ICD-10-CM | POA: Diagnosis not present

## 2020-06-20 DIAGNOSIS — Z992 Dependence on renal dialysis: Secondary | ICD-10-CM | POA: Diagnosis not present

## 2020-06-20 DIAGNOSIS — N186 End stage renal disease: Secondary | ICD-10-CM | POA: Diagnosis not present
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
## 2020-06-20 IMAGING — CT CT ABD-PELV W/ CM
2 of 5 series · 16 of 46 positions shown, 18 images · IV contrast (omnipaque)
Comparison: March 16, 2019

CLINICAL DATA: Abdomen pain. Shortness of breath.

EXAM:
CT ABDOMEN AND PELVIS WITH CONTRAST
TECHNIQUE: Multidetector CT imaging of the abdomen and pelvis was performed
using the standard protocol following bolus administration of
intravenous contrast.
CONTRAST:  100mL OMNIPAQUE IOHEXOL 300 MG/ML  SOLN

[Series 3: abdomen 5.0 (person_name) · axial · 0.83mm/px · z∈[-482,-82]mm · 13 of 94 slices shown, 15 images]
[im 7/94  soft-tissue]
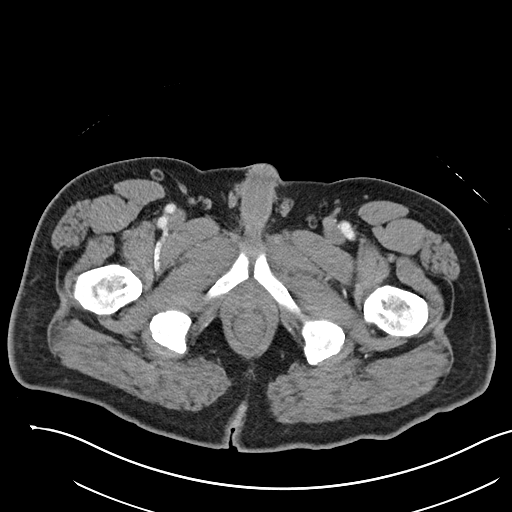
[im 7/94  bone]
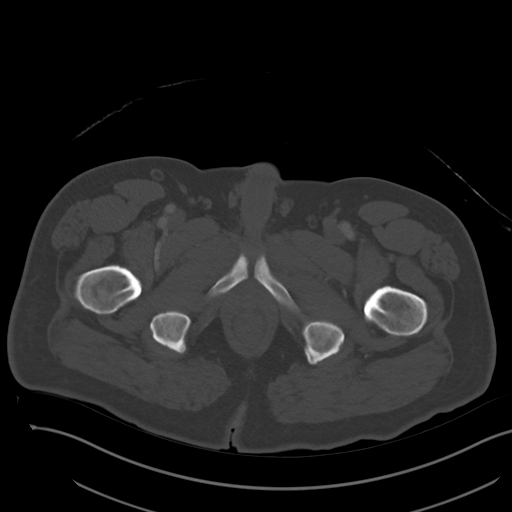
[im 13/94  soft-tissue]
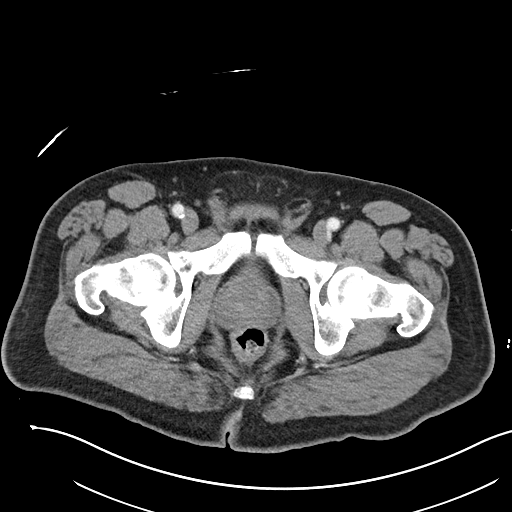
[im 19/94  soft-tissue]
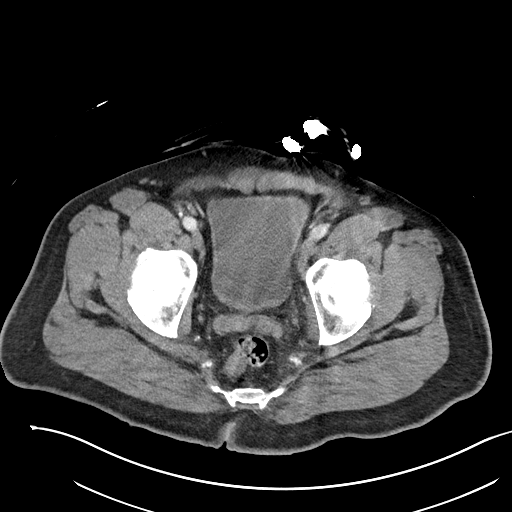
[im 25/94  soft-tissue]
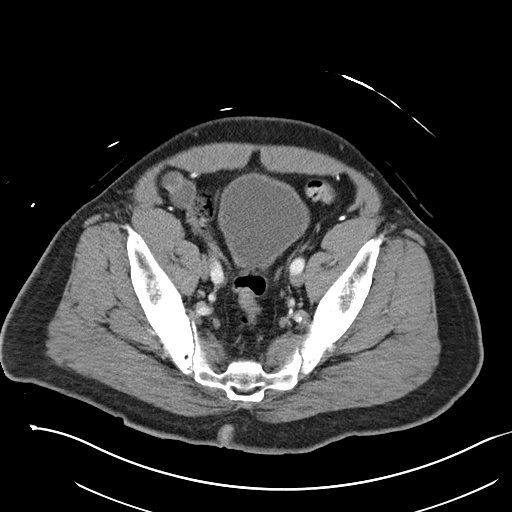
[im 32/94  soft-tissue]
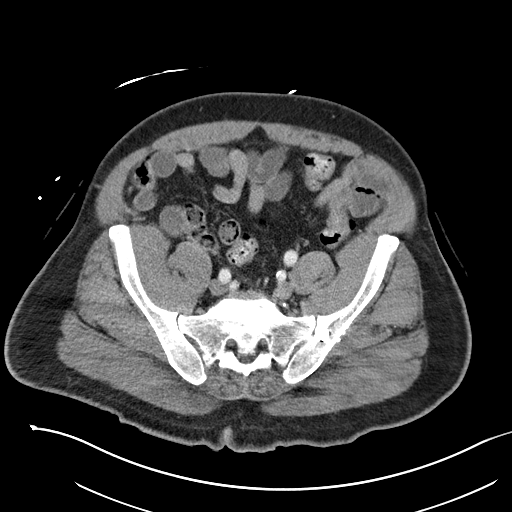
[im 38/94  soft-tissue]
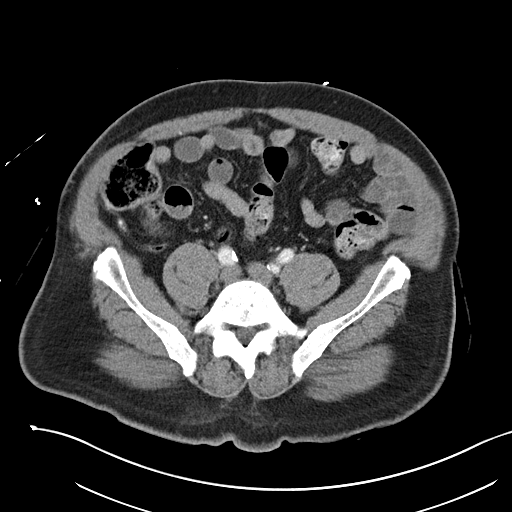
[im 50/94  soft-tissue]
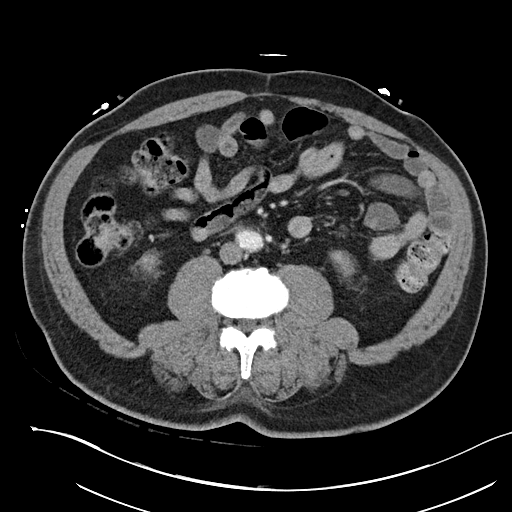
[im 56/94  soft-tissue]
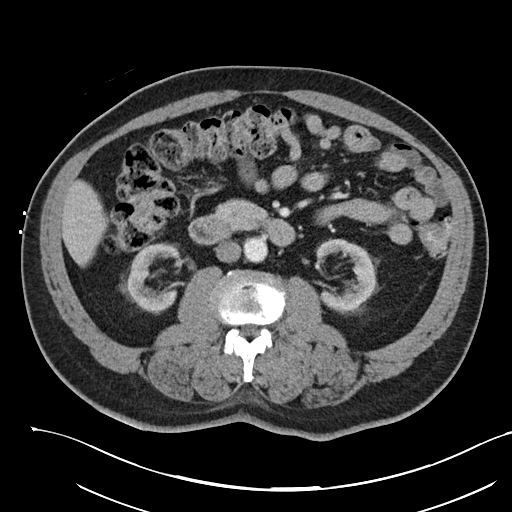
[im 63/94  soft-tissue]
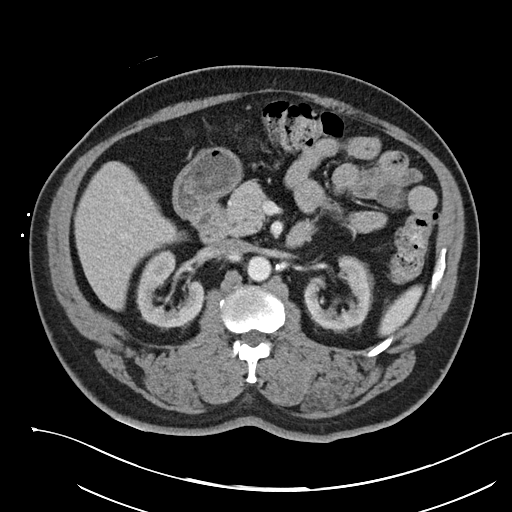
[im 63/94  bone]
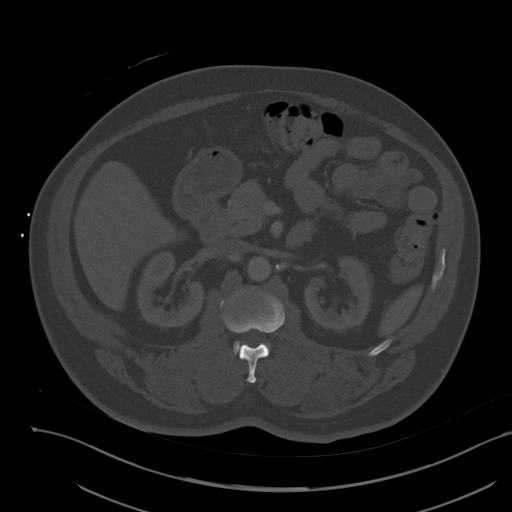
[im 69/94  soft-tissue]
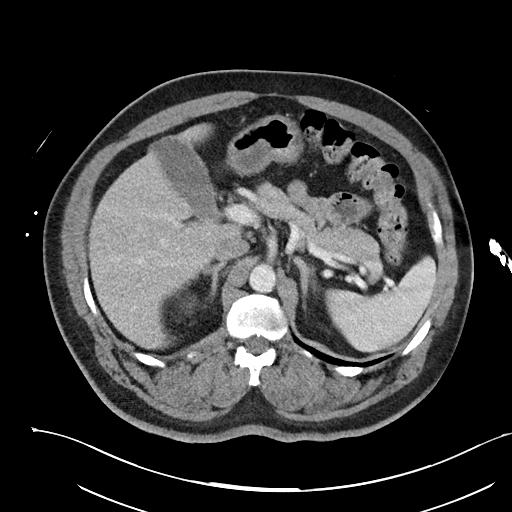
[im 75/94  soft-tissue]
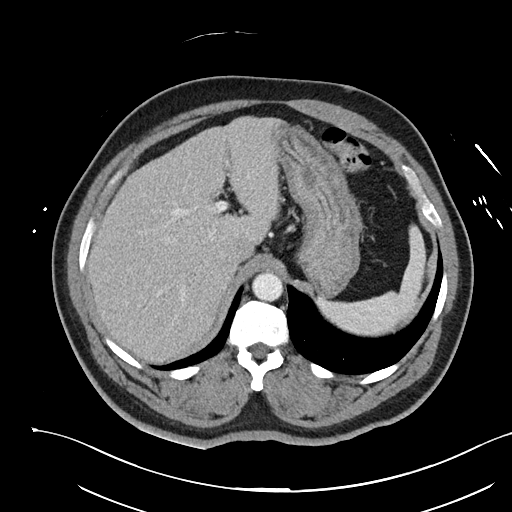
[im 81/94  soft-tissue]
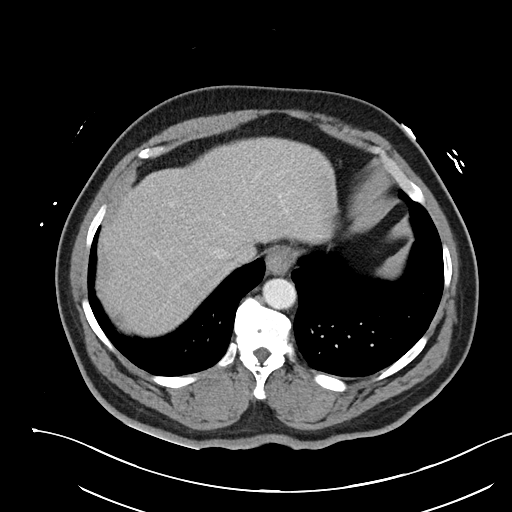
[im 87/94  soft-tissue]
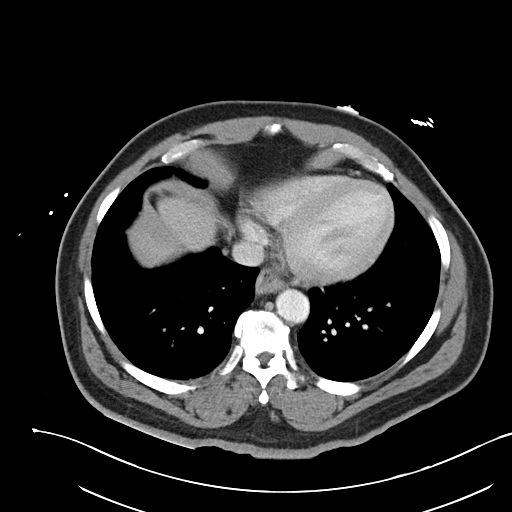

[Series 6: abdomen 3.0 mpr cor · coronal · 0.77mm/px · 3 of 110 slices shown]
[im 37/110  soft-tissue]
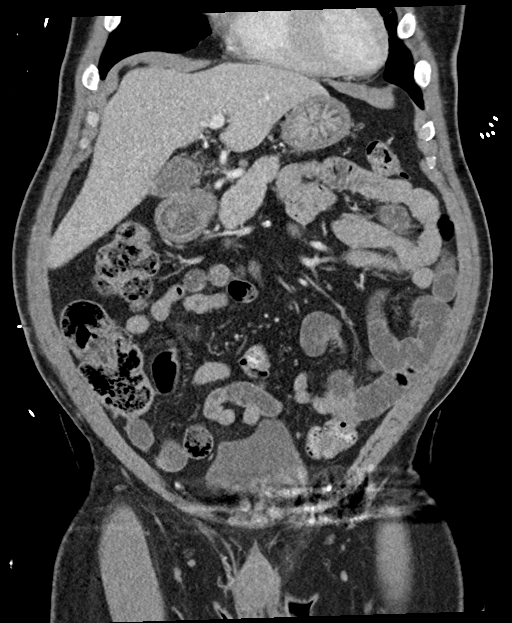
[im 49/110  soft-tissue]
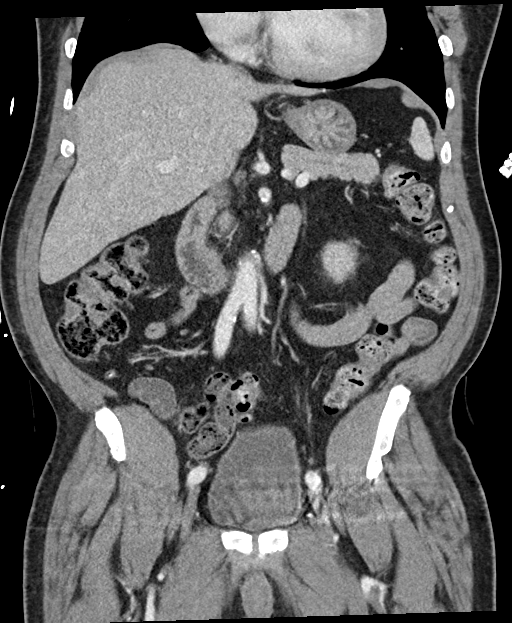
[im 61/110  soft-tissue]
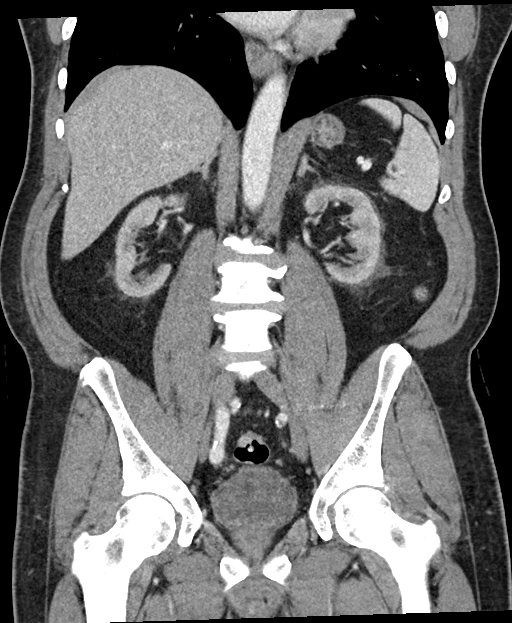

[16 of 46 positions shown; findings below may reference images not displayed]

FINDINGS: Lower chest: Mild patchy ground-glass opacity is identified in the
posterior left lung base. The heart size is upper limits of normal.

Hepatobiliary: No focal liver abnormality is seen. No gallstones,
gallbladder wall thickening, or biliary dilatation.

Pancreas: Unremarkable. No pancreatic ductal dilatation or
surrounding inflammatory changes.

Spleen: Normal in size without focal abnormality.

Adrenals/Urinary Tract: Bilateral adrenal glands are normal. Small
cysts identified in bilateral kidneys. There is no hydronephrosis
bilaterally. Chronic bilateral mild perinephric stranding is
unchanged. The bladder is normal.

Stomach/Bowel: There is a small hiatal hernia. The stomach is
otherwise normal. There is no small bowel obstruction. Colon is
normal. The appendix is normal. Moderate bowel content is identified
throughout colon.

Vascular/Lymphatic: Aortic atherosclerosis. No enlarged abdominal or
pelvic lymph nodes.

Reproductive: Prostate gland is mildly enlarged measuring 5.7 cm in
diameter.

Other: None.

Musculoskeletal: Degenerative joint changes of the spine are noted.
IMPRESSION: 1. No acute abnormality identified in the abdomen and pelvis.
2. Mild patchy ground-glass opacity in the posterior left lung base,
nonspecific but can be seen in early pneumonia.

## 2020-06-22 DIAGNOSIS — D689 Coagulation defect, unspecified: Secondary | ICD-10-CM | POA: Diagnosis not present

## 2020-06-22 DIAGNOSIS — R52 Pain, unspecified: Secondary | ICD-10-CM | POA: Diagnosis not present

## 2020-06-22 DIAGNOSIS — E1122 Type 2 diabetes mellitus with diabetic chronic kidney disease: Secondary | ICD-10-CM | POA: Diagnosis not present

## 2020-06-22 DIAGNOSIS — N186 End stage renal disease: Secondary | ICD-10-CM | POA: Diagnosis not present

## 2020-06-22 DIAGNOSIS — Z992 Dependence on renal dialysis: Secondary | ICD-10-CM | POA: Diagnosis not present

## 2020-06-22 DIAGNOSIS — N2581 Secondary hyperparathyroidism of renal origin: Secondary | ICD-10-CM | POA: Diagnosis not present

## 2020-06-24 DIAGNOSIS — N186 End stage renal disease: Secondary | ICD-10-CM | POA: Diagnosis not present

## 2020-06-24 DIAGNOSIS — Z992 Dependence on renal dialysis: Secondary | ICD-10-CM | POA: Diagnosis not present

## 2020-06-24 DIAGNOSIS — N2581 Secondary hyperparathyroidism of renal origin: Secondary | ICD-10-CM | POA: Diagnosis not present

## 2020-06-24 DIAGNOSIS — R52 Pain, unspecified: Secondary | ICD-10-CM | POA: Diagnosis not present

## 2020-06-24 DIAGNOSIS — D689 Coagulation defect, unspecified: Secondary | ICD-10-CM | POA: Diagnosis not present

## 2020-06-27 DIAGNOSIS — Z992 Dependence on renal dialysis: Secondary | ICD-10-CM | POA: Diagnosis not present

## 2020-06-27 DIAGNOSIS — N2581 Secondary hyperparathyroidism of renal origin: Secondary | ICD-10-CM | POA: Diagnosis not present

## 2020-06-27 DIAGNOSIS — D689 Coagulation defect, unspecified: Secondary | ICD-10-CM | POA: Diagnosis not present

## 2020-06-27 DIAGNOSIS — R52 Pain, unspecified: Secondary | ICD-10-CM | POA: Diagnosis not present

## 2020-06-27 DIAGNOSIS — N186 End stage renal disease: Secondary | ICD-10-CM | POA: Diagnosis not present

## 2020-06-27 NOTE — Progress Notes (Signed)
[facility] Note  06/28/2020     CHIEF COMPLAINT Patient presents for Retina Follow Up   HISTORY OF PRESENT ILLNESS: Jonathan Mcneil is a 65 y.o. male who presents to the clinic today for:   HPI    Retina Follow Up    Patient presents with  Diabetic Retinopathy.  In both eyes.  This started weeks ago.  Severity is moderate.  Duration of weeks.  Since onset it is gradually improving.  I, the attending physician,  performed the HPI with the patient and updated documentation appropriately.          Comments    Pt states vision is improving.  Pt denies eye pain or discomfort and denies any new or worsening floaters or fol OU.       Last edited by Bernarda Caffey, MD on 06/29/2020  2:58 PM. (History)    pt states   Referring physician: Merrilee Seashore, [address],  [place] [address]  HISTORICAL INFORMATION:   Selected notes from the MEDICAL RECORD NUMBER Referred by Dr. Maryjane Hurter for heme OD   CURRENT MEDICATIONS: No current outpatient medications on file. (Ophthalmic Drugs)   No current facility-administered medications for this visit. (Ophthalmic Drugs)   Current Outpatient Medications (Other)  Medication Sig  . acetaminophen (TYLENOL) 650 MG CR tablet Take 1,300 mg by mouth 2 (two) times daily as needed for pain.  Marland Kitchen amLODipine (NORVASC) 10 MG tablet Take 10 mg by mouth at bedtime.   Marland Kitchen azithromycin (ZITHROMAX Z-PAK) 250 MG tablet Take 1 tablet (250 mg total) by mouth daily. (Patient not taking: No sig reported)  . benzonatate (TESSALON) 100 MG capsule Take 1 capsule (100 mg total) by mouth every 8 (eight) hours. (Patient not taking: No sig reported)  . calcitRIOL (ROCALTROL) 0.25 MCG capsule Take 1 capsule (0.25 mcg total) by mouth every Monday, Wednesday, and Friday with hemodialysis. (Patient not taking: No sig reported)  . Multiple Vitamins-Minerals (MULTIVITAMIN) LIQD Take 32 oz by mouth daily. (Patient not  taking: Reported on 04/04/2020)  . pantoprazole (PROTONIX) 20 MG tablet Take 1 tablet (20 mg total) by mouth daily. (Patient not taking: No sig reported)  . pantoprazole (PROTONIX) 40 MG tablet Take 40 mg by mouth daily. (Patient not taking: No sig reported)  . promethazine (PHENERGAN) 25 MG suppository Place 1 suppository (25 mg total) rectally every 8 (eight) hours as needed for nausea or vomiting. (Patient not taking: No sig reported)  . promethazine (PHENERGAN) 25 MG tablet Take 25 mg by mouth 3 (three) times daily. (Patient not taking: No sig reported)  . sucralfate (CARAFATE) 1 g tablet Take 1 tablet (1 g total) by mouth 4 (four) times daily -  with meals and at bedtime. (Patient not taking: No sig reported)  . VELPHORO 500 MG chewable tablet Chew 500 mg by mouth 3 (three) times daily. (Patient not taking: No sig reported)   No current facility-administered medications for this visit. (Other)      REVIEW OF SYSTEMS: ROS    Positive for: Neurological, Genitourinary, Endocrine, Eyes, Psychiatric   Negative for: Constitutional, Gastrointestinal, Skin, Musculoskeletal, HENT, Cardiovascular, Respiratory, Allergic/Imm, Heme/Lymph   Last edited by Doneen Poisson on 06/28/2020 10:02 AM. (History)       ALLERGIES Allergies  Allergen Reactions  . Ibuprofen Other (See Comments)    Unknown reaction  . Penicillins Itching and Other (See Comments)    Has patient had a PCN  reaction causing immediate rash, facial/tongue/throat swelling, SOB or lightheadedness with hypotension: No Has patient had a PCN reaction causing severe rash involving mucus membranes or skin necrosis: No Has patient had a PCN reaction that required hospitalization No Has patient had a PCN reaction occurring within the last 10 years: Unknown If all of the above answers are "NO", then may proceed with Cephalosporin use.    PAST MEDICAL HISTORY Past Medical History:  Diagnosis Date  . Diabetes mellitus without  complication ([place])   . ESRD (end stage renal disease) ([place])   . GERD (gastroesophageal reflux disease)    PMH  . Hypertension   . Low back pain   . Metabolic acidosis   . Neuromuscular disorder ([place])    peripheral neuropathy  . Pancreatitis   . Schizophrenia ([place])    does not take medications   Past Surgical History:  Procedure Laterality Date  . A/V FISTULAGRAM Left 06/03/2018   Procedure: A/V FISTULAGRAM;  Surgeon: Marty Heck, MD;  Location: [facility];  Service: Cardiovascular;  Laterality: Left;  . AV FISTULA PLACEMENT Left 02/11/2018   Procedure: INSERTION OF ARTERIOVENOUS (AV) FISTULA LEFT  ARM;  Surgeon: [name], MD;  Location: [facility];  Service: Vascular;  Laterality: Left;  . BASCILIC VEIN TRANSPOSITION Left 04/17/2018   Procedure: BASILIC VEIN TRANSPOSITION SECOND STAGE;  Surgeon: [name], MD;  Location: [facility];  Service: Vascular;  Laterality: Left;  . BIOPSY  10/20/2019   Procedure: BIOPSY;  Surgeon: Clarene Essex, MD;  Location: [facility];  Service: Endoscopy;;  . COLONOSCOPY    . DENTAL SURGERY    . ESOPHAGOGASTRODUODENOSCOPY (EGD) WITH PROPOFOL N/A 10/20/2019   Procedure: ESOPHAGOGASTRODUODENOSCOPY (EGD) WITH PROPOFOL;  Surgeon: Clarene Essex, MD;  Location: [facility];  Service: Endoscopy;  Laterality: N/A;  . INSERTION OF DIALYSIS CATHETER Right 02/25/2018   Procedure: INSERTION OF DIALYSIS CATHETER;  Surgeon: [name], MD;  Location: [facility];  Service: Vascular;  Laterality: Right;  . PERIPHERAL VASCULAR BALLOON ANGIOPLASTY  06/03/2018   Procedure: PERIPHERAL VASCULAR BALLOON ANGIOPLASTY;  Surgeon: Marty Heck, MD;  Location: [facility];  Service: Cardiovascular;;  left a/v fistula    FAMILY HISTORY Family History  Problem Relation Age of Onset  . Kidney failure Mother   . Diabetes Father   . Blindness Brother     SOCIAL HISTORY Social History   Tobacco Use  . Smoking status:  Former Research scientist (life sciences)  . Smokeless tobacco: Never Used  Vaping Use  . Vaping Use: Never used  Substance Use Topics  . Alcohol use: No  . Drug use: Not Currently    Types: Cocaine, Marijuana    Comment: smoked marijuana a few days ago; he denies using cocaine         OPHTHALMIC EXAM:  Base Eye Exam    Visual Acuity (Snellen - Linear)      Right Left   Dist [place] 20/30 - 20/20 -2   Dist ph [place] 20/30 -2        Tonometry (Tonopen, 10:06 AM)      Right Left   Pressure 19 17       Pupils      Dark Light Shape React APD   Right 2 1 Round Minimal 0   Left 2 1 Round Minimal 0       Visual Fields      Left Right    Full Full       Extraocular Movement  Right Left    Full Full       Neuro/Psych    Oriented x3: Yes   Mood/Affect: Normal       Dilation    Both eyes: 1.0% Mydriacyl, 2.5% Phenylephrine @ 10:06 AM        Slit Lamp and Fundus Exam    Slit Lamp Exam      Right Left   Lids/Lashes Dermatochalasis - upper lid Dermatochalasis - upper lid   Conjunctiva/Sclera Melanosis Melanosis, temporal pinguecula   Cornea 1+ Punctate epithelial erosions, mild arcus 1+ Punctate epithelial erosions, mild arcus   Anterior Chamber Deep and quiet Deep and quiet   Iris Round and dilated, No NVI Round and dilated, No NVI   Lens 2-3+ Nuclear sclerosis, 2-3+ Cortical cataract 2-3+ Nuclear sclerosis, 2-3+ Cortical cataract   Vitreous Vitreous syneresis, +RBC, blood stained vitreous condensations now white, clearing centrally and settling inferiorly, white blood clots settled inferiorly Vitreous syneresis, +RBC       Fundus Exam      Right Left   Disc Mild Pallor, Sharp rim, focal PPP Mild Pallor, Sharp rim   C/D Ratio 0.4 0.4   Macula Flat, Good foveal reflex, RPE mottling and clumping, no edema, trace MA Flat, Blunted foveal reflex, RPE mottling and clumping, scattered Microaneurysms/DBH greatest temporal macula -- improved   Vessels attenuated, mild Tortuousity mild attenuation,  mild tortuousity   Periphery Attached, 360 IRH/DBH, 360 PRP with room for fill in Attached, scattered IRH/DBH greatest nasally, good 360 PRP changes          IMAGING AND PROCEDURES  Imaging and Procedures for 06/28/2020  OCT, Retina - OU - Both Eyes       Right Eye Quality was good. Central Foveal Thickness: 241. Progression has improved. Findings include no IRF, no SRF, normal foveal contour, intraretinal hyper-reflective material (Mild interval improvement in vitreous opacities, irregular lamination, mild atrophy).   Left Eye Quality was good. Central Foveal Thickness: 240. Progression has been stable. Findings include normal foveal contour, no SRF, no IRF (Stable improvement in trace cystic changes, Partial PVD).   Notes *Images captured and stored on drive  Diagnosis / Impression:  OD: NFP; no IRF/SRF; interval improvement in vitreous opacities; irregular lamination, mild atrophy OS: NFP, no IRF/SRF; Stable improvement in trace cystic changes, Partial PVD  Clinical management:  See below  Abbreviations: NFP - Normal foveal profile. CME - cystoid macular edema. PED - pigment epithelial detachment. IRF - intraretinal fluid. SRF - subretinal fluid. EZ - ellipsoid zone. ERM - epiretinal membrane. ORA - outer retinal atrophy. ORT - outer retinal tubulation. SRHM - subretinal hyper-reflective material. IRHM - intraretinal hyper-reflective material                 ASSESSMENT/PLAN:    ICD-10-CM   1. Proliferative diabetic retinopathy of both eyes with macular edema associated with type 2 diabetes mellitus ([place])  [id number]   2. Retinal edema  H35.81 OCT, Retina - OU - Both Eyes  3. Vitreous hemorrhage, right eye ([place])  H43.11   4. Essential hypertension  I10   5. Hypertensive retinopathy of both eyes  H35.033   6. Combined forms of age-related cataract of both eyes  H25.813     1,2. Proliferative diabetic retinopathy OU (OD > OS) - s/p IVA OD #1 (12.08.21) - s/p IVA OS  #1 (12.10.21) - s/p IVA OU #2 (01.11.22), #3 (02.09.22) - s/p PRP OD (01.26.22) - s/p PRP OS (03.09.22) - BCVA 20/30 OD, 20/20  OS - exam shows preretinal/subhyaloid heme and VH OD improving, OS with just scattered MA, IRH - FA (12.08.21) shows late-leaking MA, vascular nonperfusion, +NV OU (nasal midzone) -- would benefit from PRP OU   - OCT OD: interval improvement in vitreous opacities; irregular lamination, mild atrophy; OS: Stable improvement in trace cystic changes, Partial PVD  - no intervention recommended at this time - f/u 2 months, DFE, OCT, FA (transit OS)  3. Vitreous Hemorrhage OD -- improving  - acute onset with violent coughing spell  - underlying etiology related to NV/PDR as described above, but contributed by use of heparin for dialysis  - VH clearing  - s/p PRP OD (01.26.22)  - VH precautions reviewed -- minimize activities, keep head elevated, avoid ASA/NSAIDs/blood thinners as able  - monitor  4,5. Hypertensive retinopathy OU - discussed importance of tight BP control - monitor  6. Mixed Cataract OU - The symptoms of cataract, surgical options, and treatments and risks were discussed with patient. - discussed diagnosis and progression - not yet visually significant - monitor for now  Ophthalmic Meds Ordered this visit:  No orders of the defined types were placed in this encounter.     Return in about 2 months (around 08/28/2020) for f/u PDR OU, DFE, OCT, FA (transit OS).  There are no Patient Instructions on file for this visit.  Explained the diagnoses, plan, and follow up with the patient and they expressed understanding.  Patient expressed understanding of the importance of proper follow up care.   This document serves as a record of services personally performed by Gardiner Sleeper, MD, PhD. It was created on their behalf by Roselee Nova, COMT. The creation of this record is the provider's dictation and/or activities during the visit.  Electronically  signed by: Roselee Nova, COMT 06/29/20 3:03 PM   This document serves as a record of services personally performed by Gardiner Sleeper, MD, PhD. It was created on their behalf by San Jetty. Owens Shark, OA an ophthalmic technician. The creation of this record is the provider's dictation and/or activities during the visit.    Electronically signed by: San Jetty. Owens Shark, [place] 04.06.2022 3:03 PM  Gardiner Sleeper, M.D., Ph.D. Diseases & Surgery of the Retina and Vitreous [facility]  I have reviewed the above documentation for accuracy and completeness, and I agree with the above. Gardiner Sleeper, M.D., Ph.D. 06/29/20 3:03 PM  Abbreviations: M myopia (nearsighted); A astigmatism; H hyperopia (farsighted); P presbyopia; Mrx spectacle prescription;  CTL contact lenses; OD right eye; OS left eye; OU both eyes  XT exotropia; ET esotropia; PEK punctate epithelial keratitis; PEE punctate epithelial erosions; DES dry eye syndrome; MGD meibomian gland dysfunction; ATs artificial tears; PFAT's preservative free artificial tears; [place] nuclear sclerotic cataract; PSC posterior subcapsular cataract; ERM epi-retinal membrane; PVD posterior vitreous detachment; RD retinal detachment; DM diabetes mellitus; DR diabetic retinopathy; NPDR non-proliferative diabetic retinopathy; PDR proliferative diabetic retinopathy; CSME clinically significant macular edema; DME diabetic macular edema; dbh dot blot hemorrhages; CWS cotton wool spot; POAG primary open angle glaucoma; C/D cup-to-disc ratio; HVF humphrey visual field; GVF goldmann visual field; OCT optical coherence tomography; IOP intraocular pressure; BRVO Branch retinal vein occlusion; CRVO central retinal vein occlusion; CRAO central retinal artery occlusion; BRAO branch retinal artery occlusion; RT retinal tear; SB scleral buckle; PPV pars plana vitrectomy; VH Vitreous hemorrhage; PRP panretinal laser photocoagulation; IVK intravitreal kenalog; VMT vitreomacular  traction; MH Macular hole;  NVD neovascularization of the disc; NVE  neovascularization elsewhere; AREDS age related eye disease study; ARMD age related macular degeneration; POAG primary open angle glaucoma; EBMD epithelial/anterior basement membrane dystrophy; ACIOL anterior chamber intraocular lens; IOL intraocular lens; PCIOL posterior chamber intraocular lens; Phaco/IOL phacoemulsification with intraocular lens placement; [place] photorefractive keratectomy; LASIK laser assisted in situ keratomileusis; HTN hypertension; DM diabetes mellitus; COPD chronic obstructive pulmonary disease

## 2020-06-28 ENCOUNTER — Other Ambulatory Visit: Payer: Self-pay

## 2020-06-28 ENCOUNTER — Ambulatory Visit (INDEPENDENT_AMBULATORY_CARE_PROVIDER_SITE_OTHER): Payer: Medicare Other | Admitting: Ophthalmology

## 2020-06-28 DIAGNOSIS — H25813 Combined forms of age-related cataract, bilateral: Secondary | ICD-10-CM | POA: Diagnosis not present

## 2020-06-28 DIAGNOSIS — H3581 Retinal edema: Secondary | ICD-10-CM

## 2020-06-28 DIAGNOSIS — E113513 Type 2 diabetes mellitus with proliferative diabetic retinopathy with macular edema, bilateral: Secondary | ICD-10-CM

## 2020-06-28 DIAGNOSIS — H4311 Vitreous hemorrhage, right eye: Secondary | ICD-10-CM

## 2020-06-28 DIAGNOSIS — H35033 Hypertensive retinopathy, bilateral: Secondary | ICD-10-CM

## 2020-06-28 DIAGNOSIS — I10 Essential (primary) hypertension: Secondary | ICD-10-CM | POA: Diagnosis not present

## 2020-06-29 ENCOUNTER — Encounter (INDEPENDENT_AMBULATORY_CARE_PROVIDER_SITE_OTHER): Payer: Self-pay | Admitting: Ophthalmology

## 2020-06-29 DIAGNOSIS — N186 End stage renal disease: Secondary | ICD-10-CM | POA: Diagnosis not present

## 2020-06-29 DIAGNOSIS — N2581 Secondary hyperparathyroidism of renal origin: Secondary | ICD-10-CM | POA: Diagnosis not present

## 2020-06-29 DIAGNOSIS — R52 Pain, unspecified: Secondary | ICD-10-CM | POA: Diagnosis not present

## 2020-06-29 DIAGNOSIS — D689 Coagulation defect, unspecified: Secondary | ICD-10-CM | POA: Diagnosis not present

## 2020-06-29 DIAGNOSIS — Z992 Dependence on renal dialysis: Secondary | ICD-10-CM | POA: Diagnosis not present

## 2020-07-01 DIAGNOSIS — D689 Coagulation defect, unspecified: Secondary | ICD-10-CM | POA: Diagnosis not present

## 2020-07-01 DIAGNOSIS — R52 Pain, unspecified: Secondary | ICD-10-CM | POA: Diagnosis not present

## 2020-07-01 DIAGNOSIS — N186 End stage renal disease: Secondary | ICD-10-CM | POA: Diagnosis not present

## 2020-07-01 DIAGNOSIS — Z992 Dependence on renal dialysis: Secondary | ICD-10-CM | POA: Diagnosis not present

## 2020-07-01 DIAGNOSIS — N2581 Secondary hyperparathyroidism of renal origin: Secondary | ICD-10-CM | POA: Diagnosis not present

## 2020-07-04 DIAGNOSIS — R52 Pain, unspecified: Secondary | ICD-10-CM | POA: Diagnosis not present

## 2020-07-04 DIAGNOSIS — N186 End stage renal disease: Secondary | ICD-10-CM | POA: Diagnosis not present

## 2020-07-04 DIAGNOSIS — D689 Coagulation defect, unspecified: Secondary | ICD-10-CM | POA: Diagnosis not present

## 2020-07-04 DIAGNOSIS — N2581 Secondary hyperparathyroidism of renal origin: Secondary | ICD-10-CM | POA: Diagnosis not present

## 2020-07-04 DIAGNOSIS — Z992 Dependence on renal dialysis: Secondary | ICD-10-CM | POA: Diagnosis not present

## 2020-07-06 DIAGNOSIS — N2581 Secondary hyperparathyroidism of renal origin: Secondary | ICD-10-CM | POA: Diagnosis not present

## 2020-07-06 DIAGNOSIS — N186 End stage renal disease: Secondary | ICD-10-CM | POA: Diagnosis not present

## 2020-07-06 DIAGNOSIS — Z992 Dependence on renal dialysis: Secondary | ICD-10-CM | POA: Diagnosis not present

## 2020-07-06 DIAGNOSIS — R52 Pain, unspecified: Secondary | ICD-10-CM | POA: Diagnosis not present

## 2020-07-06 DIAGNOSIS — D689 Coagulation defect, unspecified: Secondary | ICD-10-CM | POA: Diagnosis not present

## 2020-07-08 DIAGNOSIS — Z992 Dependence on renal dialysis: Secondary | ICD-10-CM | POA: Diagnosis not present

## 2020-07-08 DIAGNOSIS — R52 Pain, unspecified: Secondary | ICD-10-CM | POA: Diagnosis not present

## 2020-07-08 DIAGNOSIS — N2581 Secondary hyperparathyroidism of renal origin: Secondary | ICD-10-CM | POA: Diagnosis not present

## 2020-07-08 DIAGNOSIS — N186 End stage renal disease: Secondary | ICD-10-CM | POA: Diagnosis not present

## 2020-07-08 DIAGNOSIS — D689 Coagulation defect, unspecified: Secondary | ICD-10-CM | POA: Diagnosis not present

## 2020-07-11 DIAGNOSIS — N186 End stage renal disease: Secondary | ICD-10-CM | POA: Diagnosis not present

## 2020-07-11 DIAGNOSIS — E1129 Type 2 diabetes mellitus with other diabetic kidney complication: Secondary | ICD-10-CM | POA: Diagnosis not present

## 2020-07-11 DIAGNOSIS — N2581 Secondary hyperparathyroidism of renal origin: Secondary | ICD-10-CM | POA: Diagnosis not present

## 2020-07-11 DIAGNOSIS — Z992 Dependence on renal dialysis: Secondary | ICD-10-CM | POA: Diagnosis not present

## 2020-07-11 DIAGNOSIS — R52 Pain, unspecified: Secondary | ICD-10-CM | POA: Diagnosis not present

## 2020-07-11 DIAGNOSIS — D689 Coagulation defect, unspecified: Secondary | ICD-10-CM | POA: Diagnosis not present

## 2020-07-11 DIAGNOSIS — Z23 Encounter for immunization: Secondary | ICD-10-CM | POA: Diagnosis not present

## 2020-07-12 DIAGNOSIS — N185 Chronic kidney disease, stage 5: Secondary | ICD-10-CM | POA: Diagnosis not present

## 2020-07-12 DIAGNOSIS — R531 Weakness: Secondary | ICD-10-CM | POA: Diagnosis not present

## 2020-07-12 DIAGNOSIS — E103513 Type 1 diabetes mellitus with proliferative diabetic retinopathy with macular edema, bilateral: Secondary | ICD-10-CM | POA: Diagnosis not present

## 2020-07-12 DIAGNOSIS — Z992 Dependence on renal dialysis: Secondary | ICD-10-CM | POA: Diagnosis not present

## 2020-07-12 DIAGNOSIS — I129 Hypertensive chronic kidney disease with stage 1 through stage 4 chronic kidney disease, or unspecified chronic kidney disease: Secondary | ICD-10-CM | POA: Diagnosis not present

## 2020-07-12 DIAGNOSIS — E782 Mixed hyperlipidemia: Secondary | ICD-10-CM | POA: Diagnosis not present

## 2020-07-12 DIAGNOSIS — Z0000 Encounter for general adult medical examination without abnormal findings: Secondary | ICD-10-CM | POA: Diagnosis not present

## 2020-07-12 DIAGNOSIS — I700 Atherosclerosis of aorta: Secondary | ICD-10-CM | POA: Diagnosis not present

## 2020-07-13 DIAGNOSIS — E1129 Type 2 diabetes mellitus with other diabetic kidney complication: Secondary | ICD-10-CM | POA: Diagnosis not present

## 2020-07-13 DIAGNOSIS — N2581 Secondary hyperparathyroidism of renal origin: Secondary | ICD-10-CM | POA: Diagnosis not present

## 2020-07-13 DIAGNOSIS — Z992 Dependence on renal dialysis: Secondary | ICD-10-CM | POA: Diagnosis not present

## 2020-07-13 DIAGNOSIS — D689 Coagulation defect, unspecified: Secondary | ICD-10-CM | POA: Diagnosis not present

## 2020-07-13 DIAGNOSIS — Z23 Encounter for immunization: Secondary | ICD-10-CM | POA: Diagnosis not present

## 2020-07-13 DIAGNOSIS — R52 Pain, unspecified: Secondary | ICD-10-CM | POA: Diagnosis not present

## 2020-07-13 DIAGNOSIS — N186 End stage renal disease: Secondary | ICD-10-CM | POA: Diagnosis not present

## 2020-07-15 DIAGNOSIS — Z23 Encounter for immunization: Secondary | ICD-10-CM | POA: Diagnosis not present

## 2020-07-15 DIAGNOSIS — N2581 Secondary hyperparathyroidism of renal origin: Secondary | ICD-10-CM | POA: Diagnosis not present

## 2020-07-15 DIAGNOSIS — Z992 Dependence on renal dialysis: Secondary | ICD-10-CM | POA: Diagnosis not present

## 2020-07-15 DIAGNOSIS — R52 Pain, unspecified: Secondary | ICD-10-CM | POA: Diagnosis not present

## 2020-07-15 DIAGNOSIS — D689 Coagulation defect, unspecified: Secondary | ICD-10-CM | POA: Diagnosis not present

## 2020-07-15 DIAGNOSIS — E1129 Type 2 diabetes mellitus with other diabetic kidney complication: Secondary | ICD-10-CM | POA: Diagnosis not present

## 2020-07-15 DIAGNOSIS — N186 End stage renal disease: Secondary | ICD-10-CM | POA: Diagnosis not present

## 2020-07-17 DIAGNOSIS — N186 End stage renal disease: Secondary | ICD-10-CM | POA: Diagnosis not present

## 2020-07-18 DIAGNOSIS — N2581 Secondary hyperparathyroidism of renal origin: Secondary | ICD-10-CM | POA: Diagnosis not present

## 2020-07-18 DIAGNOSIS — R52 Pain, unspecified: Secondary | ICD-10-CM | POA: Diagnosis not present

## 2020-07-18 DIAGNOSIS — L299 Pruritus, unspecified: Secondary | ICD-10-CM | POA: Diagnosis not present

## 2020-07-18 DIAGNOSIS — D689 Coagulation defect, unspecified: Secondary | ICD-10-CM | POA: Diagnosis not present

## 2020-07-18 DIAGNOSIS — Z992 Dependence on renal dialysis: Secondary | ICD-10-CM | POA: Diagnosis not present

## 2020-07-18 DIAGNOSIS — N186 End stage renal disease: Secondary | ICD-10-CM | POA: Diagnosis not present

## 2020-07-20 DIAGNOSIS — N2581 Secondary hyperparathyroidism of renal origin: Secondary | ICD-10-CM | POA: Diagnosis not present

## 2020-07-20 DIAGNOSIS — Z992 Dependence on renal dialysis: Secondary | ICD-10-CM | POA: Diagnosis not present

## 2020-07-20 DIAGNOSIS — R52 Pain, unspecified: Secondary | ICD-10-CM | POA: Diagnosis not present

## 2020-07-20 DIAGNOSIS — L299 Pruritus, unspecified: Secondary | ICD-10-CM | POA: Diagnosis not present

## 2020-07-20 DIAGNOSIS — N186 End stage renal disease: Secondary | ICD-10-CM | POA: Diagnosis not present

## 2020-07-20 DIAGNOSIS — D689 Coagulation defect, unspecified: Secondary | ICD-10-CM | POA: Diagnosis not present

## 2020-07-22 DIAGNOSIS — E1122 Type 2 diabetes mellitus with diabetic chronic kidney disease: Secondary | ICD-10-CM | POA: Diagnosis not present

## 2020-07-22 DIAGNOSIS — L299 Pruritus, unspecified: Secondary | ICD-10-CM | POA: Diagnosis not present

## 2020-07-22 DIAGNOSIS — N2581 Secondary hyperparathyroidism of renal origin: Secondary | ICD-10-CM | POA: Diagnosis not present

## 2020-07-22 DIAGNOSIS — R52 Pain, unspecified: Secondary | ICD-10-CM | POA: Diagnosis not present

## 2020-07-22 DIAGNOSIS — N186 End stage renal disease: Secondary | ICD-10-CM | POA: Diagnosis not present

## 2020-07-22 DIAGNOSIS — D689 Coagulation defect, unspecified: Secondary | ICD-10-CM | POA: Diagnosis not present

## 2020-07-22 DIAGNOSIS — Z992 Dependence on renal dialysis: Secondary | ICD-10-CM | POA: Diagnosis not present

## 2020-07-25 DIAGNOSIS — D689 Coagulation defect, unspecified: Secondary | ICD-10-CM | POA: Diagnosis not present

## 2020-07-25 DIAGNOSIS — N186 End stage renal disease: Secondary | ICD-10-CM | POA: Diagnosis not present

## 2020-07-25 DIAGNOSIS — N2581 Secondary hyperparathyroidism of renal origin: Secondary | ICD-10-CM | POA: Diagnosis not present

## 2020-07-25 DIAGNOSIS — L299 Pruritus, unspecified: Secondary | ICD-10-CM | POA: Diagnosis not present

## 2020-07-25 DIAGNOSIS — R52 Pain, unspecified: Secondary | ICD-10-CM | POA: Diagnosis not present

## 2020-07-25 DIAGNOSIS — Z992 Dependence on renal dialysis: Secondary | ICD-10-CM | POA: Diagnosis not present

## 2020-07-27 DIAGNOSIS — N2581 Secondary hyperparathyroidism of renal origin: Secondary | ICD-10-CM | POA: Diagnosis not present

## 2020-07-27 DIAGNOSIS — N186 End stage renal disease: Secondary | ICD-10-CM | POA: Diagnosis not present

## 2020-07-27 DIAGNOSIS — Z992 Dependence on renal dialysis: Secondary | ICD-10-CM | POA: Diagnosis not present

## 2020-07-27 DIAGNOSIS — R52 Pain, unspecified: Secondary | ICD-10-CM | POA: Diagnosis not present

## 2020-07-27 DIAGNOSIS — D689 Coagulation defect, unspecified: Secondary | ICD-10-CM | POA: Diagnosis not present

## 2020-07-27 DIAGNOSIS — L299 Pruritus, unspecified: Secondary | ICD-10-CM | POA: Diagnosis not present

## 2020-07-29 DIAGNOSIS — Z992 Dependence on renal dialysis: Secondary | ICD-10-CM | POA: Diagnosis not present

## 2020-07-29 DIAGNOSIS — L299 Pruritus, unspecified: Secondary | ICD-10-CM | POA: Diagnosis not present

## 2020-07-29 DIAGNOSIS — N186 End stage renal disease: Secondary | ICD-10-CM | POA: Diagnosis not present

## 2020-07-29 DIAGNOSIS — N2581 Secondary hyperparathyroidism of renal origin: Secondary | ICD-10-CM | POA: Diagnosis not present

## 2020-07-29 DIAGNOSIS — R52 Pain, unspecified: Secondary | ICD-10-CM | POA: Diagnosis not present

## 2020-07-29 DIAGNOSIS — D689 Coagulation defect, unspecified: Secondary | ICD-10-CM | POA: Diagnosis not present

## 2020-08-01 DIAGNOSIS — R52 Pain, unspecified: Secondary | ICD-10-CM | POA: Diagnosis not present

## 2020-08-01 DIAGNOSIS — L299 Pruritus, unspecified: Secondary | ICD-10-CM | POA: Diagnosis not present

## 2020-08-01 DIAGNOSIS — N2581 Secondary hyperparathyroidism of renal origin: Secondary | ICD-10-CM | POA: Diagnosis not present

## 2020-08-01 DIAGNOSIS — N186 End stage renal disease: Secondary | ICD-10-CM | POA: Diagnosis not present

## 2020-08-01 DIAGNOSIS — D689 Coagulation defect, unspecified: Secondary | ICD-10-CM | POA: Diagnosis not present

## 2020-08-01 DIAGNOSIS — Z992 Dependence on renal dialysis: Secondary | ICD-10-CM | POA: Diagnosis not present

## 2020-08-03 DIAGNOSIS — D689 Coagulation defect, unspecified: Secondary | ICD-10-CM | POA: Diagnosis not present

## 2020-08-03 DIAGNOSIS — N186 End stage renal disease: Secondary | ICD-10-CM | POA: Diagnosis not present

## 2020-08-03 DIAGNOSIS — L299 Pruritus, unspecified: Secondary | ICD-10-CM | POA: Diagnosis not present

## 2020-08-03 DIAGNOSIS — R52 Pain, unspecified: Secondary | ICD-10-CM | POA: Diagnosis not present

## 2020-08-03 DIAGNOSIS — Z992 Dependence on renal dialysis: Secondary | ICD-10-CM | POA: Diagnosis not present

## 2020-08-03 DIAGNOSIS — N2581 Secondary hyperparathyroidism of renal origin: Secondary | ICD-10-CM | POA: Diagnosis not present

## 2020-08-05 DIAGNOSIS — D689 Coagulation defect, unspecified: Secondary | ICD-10-CM | POA: Diagnosis not present

## 2020-08-05 DIAGNOSIS — N2581 Secondary hyperparathyroidism of renal origin: Secondary | ICD-10-CM | POA: Diagnosis not present

## 2020-08-05 DIAGNOSIS — Z992 Dependence on renal dialysis: Secondary | ICD-10-CM | POA: Diagnosis not present

## 2020-08-05 DIAGNOSIS — N186 End stage renal disease: Secondary | ICD-10-CM | POA: Diagnosis not present

## 2020-08-05 DIAGNOSIS — L299 Pruritus, unspecified: Secondary | ICD-10-CM | POA: Diagnosis not present

## 2020-08-05 DIAGNOSIS — R52 Pain, unspecified: Secondary | ICD-10-CM | POA: Diagnosis not present

## 2020-08-08 DIAGNOSIS — D689 Coagulation defect, unspecified: Secondary | ICD-10-CM | POA: Diagnosis not present

## 2020-08-08 DIAGNOSIS — R52 Pain, unspecified: Secondary | ICD-10-CM | POA: Diagnosis not present

## 2020-08-08 DIAGNOSIS — N186 End stage renal disease: Secondary | ICD-10-CM | POA: Diagnosis not present

## 2020-08-08 DIAGNOSIS — Z23 Encounter for immunization: Secondary | ICD-10-CM | POA: Diagnosis not present

## 2020-08-08 DIAGNOSIS — N2581 Secondary hyperparathyroidism of renal origin: Secondary | ICD-10-CM | POA: Diagnosis not present

## 2020-08-08 DIAGNOSIS — Z992 Dependence on renal dialysis: Secondary | ICD-10-CM | POA: Diagnosis not present

## 2020-08-08 DIAGNOSIS — E1129 Type 2 diabetes mellitus with other diabetic kidney complication: Secondary | ICD-10-CM | POA: Diagnosis not present

## 2020-08-08 DIAGNOSIS — L299 Pruritus, unspecified: Secondary | ICD-10-CM | POA: Diagnosis not present

## 2020-08-10 DIAGNOSIS — Z23 Encounter for immunization: Secondary | ICD-10-CM | POA: Diagnosis not present

## 2020-08-10 DIAGNOSIS — N2581 Secondary hyperparathyroidism of renal origin: Secondary | ICD-10-CM | POA: Diagnosis not present

## 2020-08-10 DIAGNOSIS — E1129 Type 2 diabetes mellitus with other diabetic kidney complication: Secondary | ICD-10-CM | POA: Diagnosis not present

## 2020-08-10 DIAGNOSIS — N186 End stage renal disease: Secondary | ICD-10-CM | POA: Diagnosis not present

## 2020-08-10 DIAGNOSIS — D689 Coagulation defect, unspecified: Secondary | ICD-10-CM | POA: Diagnosis not present

## 2020-08-10 DIAGNOSIS — R52 Pain, unspecified: Secondary | ICD-10-CM | POA: Diagnosis not present

## 2020-08-10 DIAGNOSIS — Z992 Dependence on renal dialysis: Secondary | ICD-10-CM | POA: Diagnosis not present

## 2020-08-10 DIAGNOSIS — L299 Pruritus, unspecified: Secondary | ICD-10-CM | POA: Diagnosis not present

## 2020-08-12 DIAGNOSIS — R52 Pain, unspecified: Secondary | ICD-10-CM | POA: Diagnosis not present

## 2020-08-12 DIAGNOSIS — N186 End stage renal disease: Secondary | ICD-10-CM | POA: Diagnosis not present

## 2020-08-12 DIAGNOSIS — Z992 Dependence on renal dialysis: Secondary | ICD-10-CM | POA: Diagnosis not present

## 2020-08-12 DIAGNOSIS — Z23 Encounter for immunization: Secondary | ICD-10-CM | POA: Diagnosis not present

## 2020-08-12 DIAGNOSIS — L299 Pruritus, unspecified: Secondary | ICD-10-CM | POA: Diagnosis not present

## 2020-08-12 DIAGNOSIS — N2581 Secondary hyperparathyroidism of renal origin: Secondary | ICD-10-CM | POA: Diagnosis not present

## 2020-08-12 DIAGNOSIS — D689 Coagulation defect, unspecified: Secondary | ICD-10-CM | POA: Diagnosis not present

## 2020-08-12 DIAGNOSIS — E1129 Type 2 diabetes mellitus with other diabetic kidney complication: Secondary | ICD-10-CM | POA: Diagnosis not present

## 2020-08-15 DIAGNOSIS — D689 Coagulation defect, unspecified: Secondary | ICD-10-CM | POA: Diagnosis not present

## 2020-08-15 DIAGNOSIS — L299 Pruritus, unspecified: Secondary | ICD-10-CM | POA: Diagnosis not present

## 2020-08-15 DIAGNOSIS — R52 Pain, unspecified: Secondary | ICD-10-CM | POA: Diagnosis not present

## 2020-08-15 DIAGNOSIS — N2581 Secondary hyperparathyroidism of renal origin: Secondary | ICD-10-CM | POA: Diagnosis not present

## 2020-08-15 DIAGNOSIS — Z992 Dependence on renal dialysis: Secondary | ICD-10-CM | POA: Diagnosis not present

## 2020-08-15 DIAGNOSIS — N186 End stage renal disease: Secondary | ICD-10-CM | POA: Diagnosis not present

## 2020-08-16 DIAGNOSIS — N186 End stage renal disease: Secondary | ICD-10-CM | POA: Diagnosis not present

## 2020-08-17 DIAGNOSIS — N2581 Secondary hyperparathyroidism of renal origin: Secondary | ICD-10-CM | POA: Diagnosis not present

## 2020-08-17 DIAGNOSIS — N186 End stage renal disease: Secondary | ICD-10-CM | POA: Diagnosis not present

## 2020-08-17 DIAGNOSIS — R52 Pain, unspecified: Secondary | ICD-10-CM | POA: Diagnosis not present

## 2020-08-17 DIAGNOSIS — Z992 Dependence on renal dialysis: Secondary | ICD-10-CM | POA: Diagnosis not present

## 2020-08-17 DIAGNOSIS — L299 Pruritus, unspecified: Secondary | ICD-10-CM | POA: Diagnosis not present

## 2020-08-17 DIAGNOSIS — D689 Coagulation defect, unspecified: Secondary | ICD-10-CM | POA: Diagnosis not present

## 2020-08-19 DIAGNOSIS — N186 End stage renal disease: Secondary | ICD-10-CM | POA: Diagnosis not present

## 2020-08-19 DIAGNOSIS — Z992 Dependence on renal dialysis: Secondary | ICD-10-CM | POA: Diagnosis not present

## 2020-08-19 DIAGNOSIS — R52 Pain, unspecified: Secondary | ICD-10-CM | POA: Diagnosis not present

## 2020-08-19 DIAGNOSIS — L299 Pruritus, unspecified: Secondary | ICD-10-CM | POA: Diagnosis not present

## 2020-08-19 DIAGNOSIS — N2581 Secondary hyperparathyroidism of renal origin: Secondary | ICD-10-CM | POA: Diagnosis not present

## 2020-08-19 DIAGNOSIS — D689 Coagulation defect, unspecified: Secondary | ICD-10-CM | POA: Diagnosis not present

## 2020-08-22 DIAGNOSIS — E1122 Type 2 diabetes mellitus with diabetic chronic kidney disease: Secondary | ICD-10-CM | POA: Diagnosis not present

## 2020-08-22 DIAGNOSIS — N2581 Secondary hyperparathyroidism of renal origin: Secondary | ICD-10-CM | POA: Diagnosis not present

## 2020-08-22 DIAGNOSIS — D689 Coagulation defect, unspecified: Secondary | ICD-10-CM | POA: Diagnosis not present

## 2020-08-22 DIAGNOSIS — Z992 Dependence on renal dialysis: Secondary | ICD-10-CM | POA: Diagnosis not present

## 2020-08-22 DIAGNOSIS — N186 End stage renal disease: Secondary | ICD-10-CM | POA: Diagnosis not present

## 2020-08-24 DIAGNOSIS — Z992 Dependence on renal dialysis: Secondary | ICD-10-CM | POA: Diagnosis not present

## 2020-08-24 DIAGNOSIS — R52 Pain, unspecified: Secondary | ICD-10-CM | POA: Diagnosis not present

## 2020-08-24 DIAGNOSIS — L299 Pruritus, unspecified: Secondary | ICD-10-CM | POA: Diagnosis not present

## 2020-08-24 DIAGNOSIS — D689 Coagulation defect, unspecified: Secondary | ICD-10-CM | POA: Diagnosis not present

## 2020-08-24 DIAGNOSIS — N2581 Secondary hyperparathyroidism of renal origin: Secondary | ICD-10-CM | POA: Diagnosis not present

## 2020-08-24 DIAGNOSIS — N186 End stage renal disease: Secondary | ICD-10-CM | POA: Diagnosis not present

## 2020-08-24 NOTE — Progress Notes (Signed)
[facility] Note  08/28/2020     CHIEF COMPLAINT Patient presents for Retina Follow Up   HISTORY OF PRESENT ILLNESS: Jonathan Mcneil is a 65 y.o. male who presents to the clinic today for:   HPI    Retina Follow Up    Patient presents with  Diabetic Retinopathy.  In both eyes.  Duration of 2 months.  Since onset it is gradually improving.  I, the attending physician,  performed the HPI with the patient and updated documentation appropriately.          Comments    2 month follow up [name] has improved states patient.  BS he doesn't check.  He states his doctor told him he no longer has diabetes or high blood pressure. A1C 5.8       Last edited by Bernarda Caffey, MD on 08/28/2020 12:34 PM. (History)    pt states reports occasional floaters  Referring physician: Merrilee Seashore, MD [id number],  [place] [address]  HISTORICAL INFORMATION:   Selected notes from the MEDICAL RECORD NUMBER Referred by Dr. Maryjane Hurter for heme OD   CURRENT MEDICATIONS: No current outpatient medications on file. (Ophthalmic Drugs)   No current facility-administered medications for this visit. (Ophthalmic Drugs)   Current Outpatient Medications (Other)  Medication Sig  . acetaminophen (TYLENOL) 650 MG CR tablet Take 1,300 mg by mouth 2 (two) times daily as needed for pain.  . Multiple Vitamins-Minerals (MULTIVITAMIN) LIQD Take 32 oz by mouth daily.  Marland Kitchen amLODipine (NORVASC) 10 MG tablet Take 10 mg by mouth at bedtime.  (Patient not taking: Reported on 08/28/2020)  . azithromycin (ZITHROMAX Z-PAK) 250 MG tablet Take 1 tablet (250 mg total) by mouth daily. (Patient not taking: No sig reported)  . benzonatate (TESSALON) 100 MG capsule Take 1 capsule (100 mg total) by mouth every 8 (eight) hours. (Patient not taking: No sig reported)  . calcitRIOL (ROCALTROL) 0.25 MCG capsule Take 1 capsule (0.25 mcg total) by mouth every Monday, Wednesday, and  Friday with hemodialysis. (Patient not taking: No sig reported)  . pantoprazole (PROTONIX) 20 MG tablet Take 1 tablet (20 mg total) by mouth daily. (Patient not taking: No sig reported)  . pantoprazole (PROTONIX) 40 MG tablet Take 40 mg by mouth daily. (Patient not taking: No sig reported)  . promethazine (PHENERGAN) 25 MG suppository Place 1 suppository (25 mg total) rectally every 8 (eight) hours as needed for nausea or vomiting. (Patient not taking: No sig reported)  . promethazine (PHENERGAN) 25 MG tablet Take 25 mg by mouth 3 (three) times daily. (Patient not taking: No sig reported)  . sucralfate (CARAFATE) 1 g tablet Take 1 tablet (1 g total) by mouth 4 (four) times daily -  with meals and at bedtime. (Patient not taking: No sig reported)  . VELPHORO 500 MG chewable tablet Chew 500 mg by mouth 3 (three) times daily. (Patient not taking: No sig reported)   No current facility-administered medications for this visit. (Other)      REVIEW OF SYSTEMS: ROS    Positive for: Neurological, Genitourinary, Endocrine, Eyes, Psychiatric   Negative for: Constitutional, Gastrointestinal, Skin, Musculoskeletal, HENT, Cardiovascular, Respiratory, Allergic/Imm, Heme/Lymph   Last edited by Leonie Douglas, COA on 08/28/2020  9:13 AM. (History)       ALLERGIES Allergies  Allergen Reactions  . Ibuprofen Other (See Comments)    Unknown reaction  . Penicillins Itching and Other (See Comments)  Has patient had a PCN reaction causing immediate rash, facial/tongue/throat swelling, SOB or lightheadedness with hypotension: No Has patient had a PCN reaction causing severe rash involving mucus membranes or skin necrosis: No Has patient had a PCN reaction that required hospitalization No Has patient had a PCN reaction occurring within the last 10 years: Unknown If all of the above answers are "NO", then may proceed with Cephalosporin use.    PAST MEDICAL HISTORY Past Medical History:  Diagnosis Date  .  Diabetes mellitus without complication ([place])   . ESRD (end stage renal disease) ([place])   . GERD (gastroesophageal reflux disease)    PMH  . Hypertension   . Low back pain   . Metabolic acidosis   . Neuromuscular disorder ([place])    peripheral neuropathy  . Pancreatitis   . Schizophrenia ([place])    does not take medications   Past Surgical History:  Procedure Laterality Date  . A/V FISTULAGRAM Left 06/03/2018   Procedure: A/V FISTULAGRAM;  Surgeon: Marty Heck, MD;  Location: [facility];  Service: Cardiovascular;  Laterality: Left;  . AV FISTULA PLACEMENT Left 02/11/2018   Procedure: INSERTION OF ARTERIOVENOUS (AV) FISTULA LEFT  ARM;  Surgeon: [name], MD;  Location: [facility];  Service: Vascular;  Laterality: Left;  . BASCILIC VEIN TRANSPOSITION Left 04/17/2018   Procedure: BASILIC VEIN TRANSPOSITION SECOND STAGE;  Surgeon: [name], MD;  Location: [facility];  Service: Vascular;  Laterality: Left;  . BIOPSY  10/20/2019   Procedure: BIOPSY;  Surgeon: Clarene Essex, MD;  Location: [facility];  Service: Endoscopy;;  . COLONOSCOPY    . DENTAL SURGERY    . ESOPHAGOGASTRODUODENOSCOPY (EGD) WITH PROPOFOL N/A 10/20/2019   Procedure: ESOPHAGOGASTRODUODENOSCOPY (EGD) WITH PROPOFOL;  Surgeon: Clarene Essex, MD;  Location: [facility];  Service: Endoscopy;  Laterality: N/A;  . INSERTION OF DIALYSIS CATHETER Right 02/25/2018   Procedure: INSERTION OF DIALYSIS CATHETER;  Surgeon: [name], MD;  Location: [facility];  Service: Vascular;  Laterality: Right;  . PERIPHERAL VASCULAR BALLOON ANGIOPLASTY  06/03/2018   Procedure: PERIPHERAL VASCULAR BALLOON ANGIOPLASTY;  Surgeon: Marty Heck, MD;  Location: [facility];  Service: Cardiovascular;;  left a/v fistula    FAMILY HISTORY Family History  Problem Relation Age of Onset  . Kidney failure Mother   . Diabetes Father   . Blindness Brother     SOCIAL HISTORY Social History   Tobacco  Use  . Smoking status: Former Research scientist (life sciences)  . Smokeless tobacco: Never Used  Vaping Use  . Vaping Use: Never used  Substance Use Topics  . Alcohol use: No  . Drug use: Not Currently    Types: Cocaine, Marijuana    Comment: smoked marijuana a few days ago; he denies using cocaine         OPHTHALMIC EXAM:  Base Eye Exam    Visual Acuity (Snellen - Linear)      Right Left   Dist [place] 20/25 +2 20/20       Tonometry (Tonopen, 9:20 AM)      Right Left   Pressure 20 19       Pupils      Dark Light Shape React APD   Right 2 1 Round Minimal None   Left 2 1 Round Minimal None       Visual Fields (Counting fingers)      Left Right    Full Full       Extraocular Movement  Right Left    Full Full       Neuro/Psych    Oriented x3: Yes   Mood/Affect: Normal       Dilation    Both eyes: 1.0% Mydriacyl, 2.5% Phenylephrine @ 9:20 AM        Slit Lamp and Fundus Exam    Slit Lamp Exam      Right Left   Lids/Lashes Dermatochalasis - upper lid Dermatochalasis - upper lid   Conjunctiva/Sclera Melanosis Melanosis, temporal pinguecula   Cornea Trace Punctate epithelial erosions, mild arcus trace Punctate epithelial erosions, mild arcus   Anterior Chamber Deep and quiet Deep and quiet   Iris Round and dilated, No NVI Round and dilated, No NVI   Lens 2-3+ Nuclear sclerosis, 2-3+ Cortical cataract 2-3+ Nuclear sclerosis, 2-3+ Cortical cataract   Vitreous Vitreous syneresis, +RBC, blood stained vitreous condensations now white, clearing centrally and settling inferiorly, white blood clots settled inferiorly -- improved from prior Vitreous syneresis, +RBC       Fundus Exam      Right Left   Disc Mild Pallor, Sharp rim, focal PPP Mild Pallor, Sharp rim   C/D Ratio 0.4 0.6   Macula Flat, Good foveal reflex, RPE mottling and clumping, no edema, trace MA Flat, Blunted foveal reflex, RPE mottling and clumping, scattered Microaneurysms/DBH greatest temporal macula -- improved   Vessels  attenuated, mild Tortuousity, mild Copper wiring, nasal NV improved/regressing mild attenuation, mild Copper wiring, IN NV improved/regressing   Periphery Attached, 360 IRH/DBH, 360 PRP with room for fill in Attached, scattered IRH/DBH greatest nasally, good 360 PRP changes          IMAGING AND PROCEDURES  Imaging and Procedures for 08/28/2020  OCT, Retina - OU - Both Eyes       Right Eye Quality was good. Central Foveal Thickness: 242. Progression has been stable. Findings include no IRF, no SRF, normal foveal contour, intraretinal hyper-reflective material (Mild vitreous opacities, irregular lamination, mild atrophy).   Left Eye Quality was good. Central Foveal Thickness: 244. Progression has been stable. Findings include normal foveal contour, no SRF, no IRF (Non-central IRF temporal macula caught on widefield, Partial PVD).   Notes *Images captured and stored on drive  Diagnosis / Impression:  OD: NFP; no IRF/SRF; mild vitreous opacities; irregular lamination, mild atrophy OS: NFP, no IRF/SRF; Non-central IRF temporal macula caught on widefield, Partial PVD  Clinical management:  See below  Abbreviations: NFP - Normal foveal profile. CME - cystoid macular edema. PED - pigment epithelial detachment. IRF - intraretinal fluid. SRF - subretinal fluid. EZ - ellipsoid zone. ERM - epiretinal membrane. ORA - outer retinal atrophy. ORT - outer retinal tubulation. SRHM - subretinal hyper-reflective material. IRHM - intraretinal hyper-reflective material        Fluorescein Angiography Optos (Transit OS)       Right Eye   Progression has improved. Early phase findings include microaneurysm, staining. Mid/Late phase findings include staining, microaneurysm, leakage (interval improvement in nasall NVE -- just minimal leakage remains).   Left Eye   Progression has improved. Early phase findings include staining (Low fluorescein signal). Mid/Late phase findings include staining,  leakage, microaneurysm (Interval improvement in focal NVE IN periphery -- minimal leakage).   Notes **Images stored on drive**  Impression: PDR OU OD: interval improvement in nasal NVE -- just minimal leakage remains OS: Interval improvement in focal NVE inf nasal periphery -- minimal leakage  ASSESSMENT/PLAN:    ICD-10-CM   1. Proliferative diabetic retinopathy of both eyes with macular edema associated with type 2 diabetes mellitus ([place])  [id number]   2. Retinal edema  H35.81 OCT, Retina - OU - Both Eyes  3. Vitreous hemorrhage, right eye ([place])  H43.11   4. Essential hypertension  I10   5. Hypertensive retinopathy of both eyes  H35.033 Fluorescein Angiography Optos (Transit OS)  6. Combined forms of age-related cataract of both eyes  H25.813   7. Proliferative diabetic retinopathy of both eyes without macular edema associated with type 2 diabetes mellitus ([place])  [id number]     1,2. Proliferative diabetic retinopathy OU (OD > OS) - s/p IVA OD #1 (12.08.21) - s/p IVA OS #1 (12.10.21) - s/p IVA OU #2 (01.11.22), #3 (02.09.22) - FA (12.08.21) shows late-leaking MA, vascular nonperfusion, +NV OU (nasal midzone) - s/p PRP OD (01.26.22) - s/p PRP OS (03.09.22) - BCVA 20/30 OD, 20/20 OS - exam shows preretinal/subhyaloid heme and VH OD improved w/ white heme settled inferiorly, OS with just scattered MA, IRH  - OCT OD: interval improvement in vitreous opacities; irregular lamination, mild atrophy; OS: Stable improvement in trace cystic changes, Partial PVD  - repeat FA 6.6.22 shows interval improvement in NVE/leakage OU s/p PRP OU-- just minimal leakage remains  - no intervention recommended at this time - f/u 4-6 months, sooner prn -- DFE, OCT  3. Vitreous Hemorrhage OD -- improving  - acute onset with violent coughing spell  - underlying etiology related to NV/PDR as described above, but contributed by use of heparin for dialysis  - VH clearing and settling  inferiorly -- white blood clots in inferior periphery  - s/p PRP OD (01.26.22)  - VH precautions reviewed -- minimize activities, keep head elevated, avoid ASA/NSAIDs/blood thinners as able  - monitor  4,5. Hypertensive retinopathy OU - discussed importance of tight BP control - monitor  6. Mixed Cataract OU - The symptoms of cataract, surgical options, and treatments and risks were discussed with patient. - discussed diagnosis and progression - not yet visually significant - monitor for now  Ophthalmic Meds Ordered this visit:  No orders of the defined types were placed in this encounter.     Return for f/u 4-6 months, PDR OU, DFE, OCT.  There are no Patient Instructions on file for this visit. This document serves as a record of services personally performed by Gardiner Sleeper, MD, PhD. It was created on their behalf by Leeann Must, [place], an ophthalmic technician. The creation of this record is the provider's dictation and/or activities during the visit.    Electronically signed by: Leeann Must, COA '@TODAY'$ @ 12:39 PM   This document serves as a record of services personally performed by Gardiner Sleeper, MD, PhD. It was created on their behalf by San Jetty. Owens Shark, OA an ophthalmic technician. The creation of this record is the provider's dictation and/or activities during the visit.    Electronically signed by: San Jetty. Marguerita Merles 06.06.2022 12:39 PM  Gardiner Sleeper, M.D., Ph.D. Diseases & Surgery of the Retina and [place] 08/28/2020   I have reviewed the above documentation for accuracy and completeness, and I agree with the above. Gardiner Sleeper, M.D., Ph.D. 08/28/20 12:39 PM   Abbreviations: M myopia (nearsighted); A astigmatism; H hyperopia (farsighted); P presbyopia; Mrx spectacle prescription;  CTL contact lenses; OD right eye; OS left eye; OU both eyes  XT exotropia; ET esotropia; PEK  punctate epithelial keratitis; PEE punctate  epithelial erosions; DES dry eye syndrome; MGD meibomian gland dysfunction; ATs artificial tears; PFAT's preservative free artificial tears; [place] nuclear sclerotic cataract; PSC posterior subcapsular cataract; ERM epi-retinal membrane; PVD posterior vitreous detachment; RD retinal detachment; DM diabetes mellitus; DR diabetic retinopathy; NPDR non-proliferative diabetic retinopathy; PDR proliferative diabetic retinopathy; CSME clinically significant macular edema; DME diabetic macular edema; dbh dot blot hemorrhages; CWS cotton wool spot; POAG primary open angle glaucoma; C/D cup-to-disc ratio; HVF humphrey visual field; GVF goldmann visual field; OCT optical coherence tomography; IOP intraocular pressure; BRVO Branch retinal vein occlusion; CRVO central retinal vein occlusion; CRAO central retinal artery occlusion; BRAO branch retinal artery occlusion; RT retinal tear; SB scleral buckle; PPV pars plana vitrectomy; VH Vitreous hemorrhage; PRP panretinal laser photocoagulation; IVK intravitreal kenalog; VMT vitreomacular traction; MH Macular hole;  NVD neovascularization of the disc; NVE neovascularization elsewhere; AREDS age related eye disease study; ARMD age related macular degeneration; POAG primary open angle glaucoma; EBMD epithelial/anterior basement membrane dystrophy; ACIOL anterior chamber intraocular lens; IOL intraocular lens; PCIOL posterior chamber intraocular lens; Phaco/IOL phacoemulsification with intraocular lens placement; [place] photorefractive keratectomy; LASIK laser assisted in situ keratomileusis; HTN hypertension; DM diabetes mellitus; COPD chronic obstructive pulmonary disease

## 2020-08-26 DIAGNOSIS — N186 End stage renal disease: Secondary | ICD-10-CM | POA: Diagnosis not present

## 2020-08-26 DIAGNOSIS — D689 Coagulation defect, unspecified: Secondary | ICD-10-CM | POA: Diagnosis not present

## 2020-08-26 DIAGNOSIS — L299 Pruritus, unspecified: Secondary | ICD-10-CM | POA: Diagnosis not present

## 2020-08-26 DIAGNOSIS — Z992 Dependence on renal dialysis: Secondary | ICD-10-CM | POA: Diagnosis not present

## 2020-08-26 DIAGNOSIS — N2581 Secondary hyperparathyroidism of renal origin: Secondary | ICD-10-CM | POA: Diagnosis not present

## 2020-08-26 DIAGNOSIS — R52 Pain, unspecified: Secondary | ICD-10-CM | POA: Diagnosis not present

## 2020-08-28 ENCOUNTER — Encounter (INDEPENDENT_AMBULATORY_CARE_PROVIDER_SITE_OTHER): Payer: Self-pay | Admitting: Ophthalmology

## 2020-08-28 ENCOUNTER — Other Ambulatory Visit: Payer: Self-pay

## 2020-08-28 ENCOUNTER — Ambulatory Visit (INDEPENDENT_AMBULATORY_CARE_PROVIDER_SITE_OTHER): Payer: Medicare Other | Admitting: Ophthalmology

## 2020-08-28 DIAGNOSIS — H35033 Hypertensive retinopathy, bilateral: Secondary | ICD-10-CM | POA: Diagnosis not present

## 2020-08-28 DIAGNOSIS — H4311 Vitreous hemorrhage, right eye: Secondary | ICD-10-CM | POA: Diagnosis not present

## 2020-08-28 DIAGNOSIS — H3581 Retinal edema: Secondary | ICD-10-CM

## 2020-08-28 DIAGNOSIS — H25813 Combined forms of age-related cataract, bilateral: Secondary | ICD-10-CM

## 2020-08-28 DIAGNOSIS — I10 Essential (primary) hypertension: Secondary | ICD-10-CM

## 2020-08-28 DIAGNOSIS — E113593 Type 2 diabetes mellitus with proliferative diabetic retinopathy without macular edema, bilateral: Secondary | ICD-10-CM | POA: Diagnosis not present

## 2020-08-28 DIAGNOSIS — E113513 Type 2 diabetes mellitus with proliferative diabetic retinopathy with macular edema, bilateral: Secondary | ICD-10-CM | POA: Diagnosis not present

## 2020-08-29 DIAGNOSIS — L299 Pruritus, unspecified: Secondary | ICD-10-CM | POA: Diagnosis not present

## 2020-08-29 DIAGNOSIS — R52 Pain, unspecified: Secondary | ICD-10-CM | POA: Diagnosis not present

## 2020-08-29 DIAGNOSIS — N186 End stage renal disease: Secondary | ICD-10-CM | POA: Diagnosis not present

## 2020-08-29 DIAGNOSIS — D689 Coagulation defect, unspecified: Secondary | ICD-10-CM | POA: Diagnosis not present

## 2020-08-29 DIAGNOSIS — N2581 Secondary hyperparathyroidism of renal origin: Secondary | ICD-10-CM | POA: Diagnosis not present

## 2020-08-29 DIAGNOSIS — Z992 Dependence on renal dialysis: Secondary | ICD-10-CM | POA: Diagnosis not present

## 2020-08-31 DIAGNOSIS — R52 Pain, unspecified: Secondary | ICD-10-CM | POA: Diagnosis not present

## 2020-08-31 DIAGNOSIS — Z992 Dependence on renal dialysis: Secondary | ICD-10-CM | POA: Diagnosis not present

## 2020-08-31 DIAGNOSIS — N2581 Secondary hyperparathyroidism of renal origin: Secondary | ICD-10-CM | POA: Diagnosis not present

## 2020-08-31 DIAGNOSIS — D689 Coagulation defect, unspecified: Secondary | ICD-10-CM | POA: Diagnosis not present

## 2020-08-31 DIAGNOSIS — L299 Pruritus, unspecified: Secondary | ICD-10-CM | POA: Diagnosis not present

## 2020-08-31 DIAGNOSIS — N186 End stage renal disease: Secondary | ICD-10-CM | POA: Diagnosis not present

## 2020-09-02 DIAGNOSIS — N186 End stage renal disease: Secondary | ICD-10-CM | POA: Diagnosis not present

## 2020-09-02 DIAGNOSIS — D689 Coagulation defect, unspecified: Secondary | ICD-10-CM | POA: Diagnosis not present

## 2020-09-02 DIAGNOSIS — N2581 Secondary hyperparathyroidism of renal origin: Secondary | ICD-10-CM | POA: Diagnosis not present

## 2020-09-02 DIAGNOSIS — Z992 Dependence on renal dialysis: Secondary | ICD-10-CM | POA: Diagnosis not present

## 2020-09-02 DIAGNOSIS — L299 Pruritus, unspecified: Secondary | ICD-10-CM | POA: Diagnosis not present

## 2020-09-02 DIAGNOSIS — R52 Pain, unspecified: Secondary | ICD-10-CM | POA: Diagnosis not present

## 2020-09-05 DIAGNOSIS — E1129 Type 2 diabetes mellitus with other diabetic kidney complication: Secondary | ICD-10-CM | POA: Diagnosis not present

## 2020-09-05 DIAGNOSIS — Z23 Encounter for immunization: Secondary | ICD-10-CM | POA: Diagnosis not present

## 2020-09-05 DIAGNOSIS — D689 Coagulation defect, unspecified: Secondary | ICD-10-CM | POA: Diagnosis not present

## 2020-09-05 DIAGNOSIS — Z992 Dependence on renal dialysis: Secondary | ICD-10-CM | POA: Diagnosis not present

## 2020-09-05 DIAGNOSIS — N186 End stage renal disease: Secondary | ICD-10-CM | POA: Diagnosis not present

## 2020-09-05 DIAGNOSIS — R52 Pain, unspecified: Secondary | ICD-10-CM | POA: Diagnosis not present

## 2020-09-05 DIAGNOSIS — R197 Diarrhea, unspecified: Secondary | ICD-10-CM | POA: Diagnosis not present

## 2020-09-05 DIAGNOSIS — L299 Pruritus, unspecified: Secondary | ICD-10-CM | POA: Diagnosis not present

## 2020-09-05 DIAGNOSIS — N2581 Secondary hyperparathyroidism of renal origin: Secondary | ICD-10-CM | POA: Diagnosis not present

## 2020-09-07 DIAGNOSIS — R52 Pain, unspecified: Secondary | ICD-10-CM | POA: Diagnosis not present

## 2020-09-07 DIAGNOSIS — Z23 Encounter for immunization: Secondary | ICD-10-CM | POA: Diagnosis not present

## 2020-09-07 DIAGNOSIS — D689 Coagulation defect, unspecified: Secondary | ICD-10-CM | POA: Diagnosis not present

## 2020-09-07 DIAGNOSIS — E1129 Type 2 diabetes mellitus with other diabetic kidney complication: Secondary | ICD-10-CM | POA: Diagnosis not present

## 2020-09-07 DIAGNOSIS — R197 Diarrhea, unspecified: Secondary | ICD-10-CM | POA: Diagnosis not present

## 2020-09-07 DIAGNOSIS — N2581 Secondary hyperparathyroidism of renal origin: Secondary | ICD-10-CM | POA: Diagnosis not present

## 2020-09-07 DIAGNOSIS — Z992 Dependence on renal dialysis: Secondary | ICD-10-CM | POA: Diagnosis not present

## 2020-09-07 DIAGNOSIS — L299 Pruritus, unspecified: Secondary | ICD-10-CM | POA: Diagnosis not present

## 2020-09-07 DIAGNOSIS — N186 End stage renal disease: Secondary | ICD-10-CM | POA: Diagnosis not present

## 2020-09-09 DIAGNOSIS — D689 Coagulation defect, unspecified: Secondary | ICD-10-CM | POA: Diagnosis not present

## 2020-09-09 DIAGNOSIS — Z23 Encounter for immunization: Secondary | ICD-10-CM | POA: Diagnosis not present

## 2020-09-09 DIAGNOSIS — N2581 Secondary hyperparathyroidism of renal origin: Secondary | ICD-10-CM | POA: Diagnosis not present

## 2020-09-09 DIAGNOSIS — R197 Diarrhea, unspecified: Secondary | ICD-10-CM | POA: Diagnosis not present

## 2020-09-09 DIAGNOSIS — R52 Pain, unspecified: Secondary | ICD-10-CM | POA: Diagnosis not present

## 2020-09-09 DIAGNOSIS — L299 Pruritus, unspecified: Secondary | ICD-10-CM | POA: Diagnosis not present

## 2020-09-09 DIAGNOSIS — E1129 Type 2 diabetes mellitus with other diabetic kidney complication: Secondary | ICD-10-CM | POA: Diagnosis not present

## 2020-09-09 DIAGNOSIS — Z992 Dependence on renal dialysis: Secondary | ICD-10-CM | POA: Diagnosis not present

## 2020-09-09 DIAGNOSIS — N186 End stage renal disease: Secondary | ICD-10-CM | POA: Diagnosis not present

## 2020-09-12 DIAGNOSIS — R52 Pain, unspecified: Secondary | ICD-10-CM | POA: Diagnosis not present

## 2020-09-12 DIAGNOSIS — L299 Pruritus, unspecified: Secondary | ICD-10-CM | POA: Diagnosis not present

## 2020-09-12 DIAGNOSIS — N186 End stage renal disease: Secondary | ICD-10-CM | POA: Diagnosis not present

## 2020-09-12 DIAGNOSIS — D689 Coagulation defect, unspecified: Secondary | ICD-10-CM | POA: Diagnosis not present

## 2020-09-12 DIAGNOSIS — R197 Diarrhea, unspecified: Secondary | ICD-10-CM | POA: Diagnosis not present

## 2020-09-12 DIAGNOSIS — Z992 Dependence on renal dialysis: Secondary | ICD-10-CM | POA: Diagnosis not present

## 2020-09-12 DIAGNOSIS — N2581 Secondary hyperparathyroidism of renal origin: Secondary | ICD-10-CM | POA: Diagnosis not present

## 2020-09-14 DIAGNOSIS — L299 Pruritus, unspecified: Secondary | ICD-10-CM | POA: Diagnosis not present

## 2020-09-14 DIAGNOSIS — N186 End stage renal disease: Secondary | ICD-10-CM | POA: Diagnosis not present

## 2020-09-14 DIAGNOSIS — D689 Coagulation defect, unspecified: Secondary | ICD-10-CM | POA: Diagnosis not present

## 2020-09-14 DIAGNOSIS — R197 Diarrhea, unspecified: Secondary | ICD-10-CM | POA: Diagnosis not present

## 2020-09-14 DIAGNOSIS — R52 Pain, unspecified: Secondary | ICD-10-CM | POA: Diagnosis not present

## 2020-09-14 DIAGNOSIS — N2581 Secondary hyperparathyroidism of renal origin: Secondary | ICD-10-CM | POA: Diagnosis not present

## 2020-09-14 DIAGNOSIS — Z992 Dependence on renal dialysis: Secondary | ICD-10-CM | POA: Diagnosis not present

## 2020-09-16 DIAGNOSIS — L299 Pruritus, unspecified: Secondary | ICD-10-CM | POA: Diagnosis not present

## 2020-09-16 DIAGNOSIS — N2581 Secondary hyperparathyroidism of renal origin: Secondary | ICD-10-CM | POA: Diagnosis not present

## 2020-09-16 DIAGNOSIS — N186 End stage renal disease: Secondary | ICD-10-CM | POA: Diagnosis not present

## 2020-09-16 DIAGNOSIS — D689 Coagulation defect, unspecified: Secondary | ICD-10-CM | POA: Diagnosis not present

## 2020-09-16 DIAGNOSIS — R197 Diarrhea, unspecified: Secondary | ICD-10-CM | POA: Diagnosis not present

## 2020-09-16 DIAGNOSIS — R52 Pain, unspecified: Secondary | ICD-10-CM | POA: Diagnosis not present

## 2020-09-16 DIAGNOSIS — Z992 Dependence on renal dialysis: Secondary | ICD-10-CM | POA: Diagnosis not present

## 2020-09-19 ENCOUNTER — Other Ambulatory Visit: Payer: Self-pay

## 2020-09-19 ENCOUNTER — Emergency Department (HOSPITAL_COMMUNITY): Payer: Medicare Other

## 2020-09-19 ENCOUNTER — Emergency Department (HOSPITAL_COMMUNITY)
Admission: EM | Admit: 2020-09-19 | Discharge: 2020-09-19 | Disposition: A | Discharge status: Home / Self Care | Payer: Medicare Other | Attending: Emergency Medicine | Admitting: Emergency Medicine

## 2020-09-19 DIAGNOSIS — N2581 Secondary hyperparathyroidism of renal origin: Secondary | ICD-10-CM | POA: Diagnosis not present

## 2020-09-19 DIAGNOSIS — R059 Cough, unspecified: Secondary | ICD-10-CM | POA: Diagnosis not present

## 2020-09-19 DIAGNOSIS — I10 Essential (primary) hypertension: Secondary | ICD-10-CM | POA: Diagnosis not present

## 2020-09-19 DIAGNOSIS — Z79899 Other long term (current) drug therapy: Secondary | ICD-10-CM | POA: Diagnosis not present

## 2020-09-19 DIAGNOSIS — I120 Hypertensive chronic kidney disease with stage 5 chronic kidney disease or end stage renal disease: Secondary | ICD-10-CM | POA: Diagnosis not present

## 2020-09-19 DIAGNOSIS — R52 Pain, unspecified: Secondary | ICD-10-CM | POA: Diagnosis not present

## 2020-09-19 DIAGNOSIS — E1122 Type 2 diabetes mellitus with diabetic chronic kidney disease: Secondary | ICD-10-CM | POA: Diagnosis not present

## 2020-09-19 DIAGNOSIS — D689 Coagulation defect, unspecified: Secondary | ICD-10-CM | POA: Diagnosis not present

## 2020-09-19 DIAGNOSIS — E1140 Type 2 diabetes mellitus with diabetic neuropathy, unspecified: Secondary | ICD-10-CM | POA: Insufficient documentation

## 2020-09-19 DIAGNOSIS — N186 End stage renal disease: Secondary | ICD-10-CM | POA: Insufficient documentation

## 2020-09-19 DIAGNOSIS — Z20822 Contact with and (suspected) exposure to covid-19: Secondary | ICD-10-CM | POA: Diagnosis not present

## 2020-09-19 DIAGNOSIS — R197 Diarrhea, unspecified: Secondary | ICD-10-CM | POA: Diagnosis not present

## 2020-09-19 DIAGNOSIS — Z87891 Personal history of nicotine dependence: Secondary | ICD-10-CM | POA: Diagnosis not present

## 2020-09-19 DIAGNOSIS — R519 Headache, unspecified: Secondary | ICD-10-CM | POA: Insufficient documentation

## 2020-09-19 DIAGNOSIS — M545 Low back pain, unspecified: Secondary | ICD-10-CM | POA: Insufficient documentation

## 2020-09-19 DIAGNOSIS — L299 Pruritus, unspecified: Secondary | ICD-10-CM | POA: Diagnosis not present

## 2020-09-19 DIAGNOSIS — R093 Abnormal sputum: Secondary | ICD-10-CM | POA: Insufficient documentation

## 2020-09-19 DIAGNOSIS — Z992 Dependence on renal dialysis: Secondary | ICD-10-CM | POA: Diagnosis not present

## 2020-09-19 DIAGNOSIS — I517 Cardiomegaly: Secondary | ICD-10-CM | POA: Diagnosis not present

## 2020-09-19 LAB — CBC WITH DIFFERENTIAL/PLATELET
Abs Immature Granulocytes: 0.02 10*3/uL (ref 0.00–0.07)
Basophils Absolute: 0.1 10*3/uL (ref 0.0–0.1)
Basophils Relative: 2 %
Eosinophils Absolute: 0.5 10*3/uL (ref 0.0–0.5)
Eosinophils Relative: 14 %
HCT: 38.6 % — ABNORMAL LOW (ref 39.0–52.0)
Hemoglobin: 12.7 g/dL — ABNORMAL LOW (ref 13.0–17.0)
Immature Granulocytes: 1 %
Lymphocytes Relative: 20 %
Lymphs Abs: 0.7 10*3/uL (ref 0.7–4.0)
MCH: 31.9 pg (ref 26.0–34.0)
MCHC: 32.9 g/dL (ref 30.0–36.0)
MCV: 97 fL (ref 80.0–100.0)
Monocytes Absolute: 0.4 10*3/uL (ref 0.1–1.0)
Monocytes Relative: 12 %
Neutro Abs: 1.8 10*3/uL (ref 1.7–7.7)
Neutrophils Relative %: 51 %
Platelets: 136 10*3/uL — ABNORMAL LOW (ref 150–400)
RBC: 3.98 MIL/uL — ABNORMAL LOW (ref 4.22–5.81)
RDW: 16.5 % — ABNORMAL HIGH (ref 11.5–15.5)
WBC: 3.4 10*3/uL — ABNORMAL LOW (ref 4.0–10.5)
nRBC: 0 % (ref 0.0–0.2)

## 2020-09-19 LAB — RESP PANEL BY RT-PCR (FLU A&B, COVID) ARPGX2
Influenza A by PCR: NEGATIVE
Influenza B by PCR: NEGATIVE
SARS Coronavirus 2 by RT PCR: NEGATIVE

## 2020-09-19 LAB — BASIC METABOLIC PANEL
Anion gap: 14 (ref 5–15)
BUN: 18 mg/dL (ref 8–23)
CO2: 31 mmol/L (ref 22–32)
Calcium: 9.4 mg/dL (ref 8.9–10.3)
Chloride: 91 mmol/L — ABNORMAL LOW (ref 98–111)
Creatinine, Ser: 5.35 mg/dL — ABNORMAL HIGH (ref 0.61–1.24)
GFR, Estimated: 11 mL/min — ABNORMAL LOW (ref >60)
Glucose, Bld: 127 mg/dL — ABNORMAL HIGH (ref 70–99)
Potassium: 3.8 mmol/L (ref 3.5–5.1)
Sodium: 136 mmol/L (ref 135–145)

## 2020-09-19 LAB — CBG MONITORING, ED: Glucose-Capillary: 147 mg/dL — ABNORMAL HIGH (ref 70–99)

## 2020-09-19 MED ORDER — GABAPENTIN 100 MG PO CAPS
100.0000 mg | ORAL_CAPSULE | Freq: Once | ORAL | Status: AC
  Administered 2020-09-19: 100 mg via ORAL
  Filled 2020-09-19: qty 1

## 2020-09-19 MED ORDER — OXYCODONE HCL 5 MG PO TABS
5.0000 mg | ORAL_TABLET | Freq: Once | ORAL | Status: AC
Start: 2020-09-19 — End: 2020-09-19
  Administered 2020-09-19: 5 mg via ORAL
  Filled 2020-09-19: qty 1

## 2020-09-19 MED ORDER — OXYCODONE HCL 5 MG PO TABS
2.5000 mg | ORAL_TABLET | Freq: Once | ORAL | Status: AC
  Administered 2020-09-19: 2.5 mg via ORAL
  Filled 2020-09-19: qty 1

## 2020-09-19 NOTE — ED Provider Notes (Signed)
Emergency Medicine Provider Triage Evaluation Note  Jonathan Mcneil , a 65 y.o. male  was evaluated in triage.  Pt complains of headache, coughing up phlegm, abd pain, back pain. New unilateral numbness, vision changes  Review of Systems  Positive:  headache, coughing up phlegm, abd pain, back pain. Negative: Unilateral numbness, vision changes  Physical Exam  BP 132/90   Pulse 73   Temp 98.6 F (37 C)   Resp 18   SpO2 100%  Gen:   Awake, no distress   Resp:  Normal effort  MSK:   Moves extremities without difficulty  Other:  Clear speech, no facial droop moving all extremities  Medical Decision Making  Medically screening exam initiated at 4:10 PM.  Appropriate orders placed.  Jonathan Mcneil was informed that the remainder of the evaluation will be completed by another provider, this initial triage assessment does not replace that evaluation, and the importance of remaining in the ED until their evaluation is complete.     Rodney Booze, PA-C 09/19/20 1611    Blanchie Dessert, MD 09/21/20 1226

## 2020-09-19 NOTE — ED Triage Notes (Addendum)
Pt reports sharp pain to L side of head, cough with clear thick phlegm, and L lower back pain that started today.  States he had laser eye surgery 2 weeks ago.  Denies urinary complaints.  Full dialysis treatment today.

## 2020-09-19 NOTE — ED Notes (Signed)
Pt here with generalized body aches, back pain, and productive cough for a few days. Denies sick contacts.
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
## 2020-09-21 DIAGNOSIS — Z992 Dependence on renal dialysis: Secondary | ICD-10-CM | POA: Diagnosis not present

## 2020-09-21 DIAGNOSIS — N186 End stage renal disease: Secondary | ICD-10-CM | POA: Diagnosis not present

## 2020-09-21 DIAGNOSIS — N2581 Secondary hyperparathyroidism of renal origin: Secondary | ICD-10-CM | POA: Diagnosis not present

## 2020-09-21 DIAGNOSIS — L299 Pruritus, unspecified: Secondary | ICD-10-CM | POA: Diagnosis not present

## 2020-09-21 DIAGNOSIS — R197 Diarrhea, unspecified: Secondary | ICD-10-CM | POA: Diagnosis not present

## 2020-09-21 DIAGNOSIS — E1122 Type 2 diabetes mellitus with diabetic chronic kidney disease: Secondary | ICD-10-CM | POA: Diagnosis not present

## 2020-09-21 DIAGNOSIS — D689 Coagulation defect, unspecified: Secondary | ICD-10-CM | POA: Diagnosis not present

## 2020-09-21 DIAGNOSIS — R52 Pain, unspecified: Secondary | ICD-10-CM | POA: Diagnosis not present

## 2020-09-23 DIAGNOSIS — D689 Coagulation defect, unspecified: Secondary | ICD-10-CM | POA: Diagnosis not present

## 2020-09-23 DIAGNOSIS — N2581 Secondary hyperparathyroidism of renal origin: Secondary | ICD-10-CM | POA: Diagnosis not present

## 2020-09-23 DIAGNOSIS — L299 Pruritus, unspecified: Secondary | ICD-10-CM | POA: Diagnosis not present

## 2020-09-23 DIAGNOSIS — Z992 Dependence on renal dialysis: Secondary | ICD-10-CM | POA: Diagnosis not present

## 2020-09-23 DIAGNOSIS — R52 Pain, unspecified: Secondary | ICD-10-CM | POA: Diagnosis not present

## 2020-09-23 DIAGNOSIS — N186 End stage renal disease: Secondary | ICD-10-CM | POA: Diagnosis not present

## 2020-09-23 DIAGNOSIS — E1129 Type 2 diabetes mellitus with other diabetic kidney complication: Secondary | ICD-10-CM | POA: Diagnosis not present

## 2020-09-26 DIAGNOSIS — L299 Pruritus, unspecified: Secondary | ICD-10-CM | POA: Diagnosis not present

## 2020-09-26 DIAGNOSIS — N186 End stage renal disease: Secondary | ICD-10-CM | POA: Diagnosis not present

## 2020-09-26 DIAGNOSIS — E1129 Type 2 diabetes mellitus with other diabetic kidney complication: Secondary | ICD-10-CM | POA: Diagnosis not present

## 2020-09-26 DIAGNOSIS — Z992 Dependence on renal dialysis: Secondary | ICD-10-CM | POA: Diagnosis not present

## 2020-09-26 DIAGNOSIS — N2581 Secondary hyperparathyroidism of renal origin: Secondary | ICD-10-CM | POA: Diagnosis not present

## 2020-09-26 DIAGNOSIS — R52 Pain, unspecified: Secondary | ICD-10-CM | POA: Diagnosis not present

## 2020-09-26 DIAGNOSIS — D689 Coagulation defect, unspecified: Secondary | ICD-10-CM | POA: Diagnosis not present

## 2020-09-28 DIAGNOSIS — N2581 Secondary hyperparathyroidism of renal origin: Secondary | ICD-10-CM | POA: Diagnosis not present

## 2020-09-28 DIAGNOSIS — N186 End stage renal disease: Secondary | ICD-10-CM | POA: Diagnosis not present

## 2020-09-28 DIAGNOSIS — Z992 Dependence on renal dialysis: Secondary | ICD-10-CM | POA: Diagnosis not present

## 2020-09-28 DIAGNOSIS — R52 Pain, unspecified: Secondary | ICD-10-CM | POA: Diagnosis not present

## 2020-09-28 DIAGNOSIS — L299 Pruritus, unspecified: Secondary | ICD-10-CM | POA: Diagnosis not present

## 2020-09-28 DIAGNOSIS — D689 Coagulation defect, unspecified: Secondary | ICD-10-CM | POA: Diagnosis not present

## 2020-09-28 DIAGNOSIS — E1129 Type 2 diabetes mellitus with other diabetic kidney complication: Secondary | ICD-10-CM | POA: Diagnosis not present

## 2020-09-30 DIAGNOSIS — N186 End stage renal disease: Secondary | ICD-10-CM | POA: Diagnosis not present

## 2020-09-30 DIAGNOSIS — D689 Coagulation defect, unspecified: Secondary | ICD-10-CM | POA: Diagnosis not present

## 2020-09-30 DIAGNOSIS — E1129 Type 2 diabetes mellitus with other diabetic kidney complication: Secondary | ICD-10-CM | POA: Diagnosis not present

## 2020-09-30 DIAGNOSIS — R52 Pain, unspecified: Secondary | ICD-10-CM | POA: Diagnosis not present

## 2020-09-30 DIAGNOSIS — N2581 Secondary hyperparathyroidism of renal origin: Secondary | ICD-10-CM | POA: Diagnosis not present

## 2020-09-30 DIAGNOSIS — L299 Pruritus, unspecified: Secondary | ICD-10-CM | POA: Diagnosis not present

## 2020-09-30 DIAGNOSIS — Z992 Dependence on renal dialysis: Secondary | ICD-10-CM | POA: Diagnosis not present

## 2020-10-03 DIAGNOSIS — L299 Pruritus, unspecified: Secondary | ICD-10-CM | POA: Diagnosis not present

## 2020-10-03 DIAGNOSIS — Z992 Dependence on renal dialysis: Secondary | ICD-10-CM | POA: Diagnosis not present

## 2020-10-03 DIAGNOSIS — R52 Pain, unspecified: Secondary | ICD-10-CM | POA: Diagnosis not present

## 2020-10-03 DIAGNOSIS — D689 Coagulation defect, unspecified: Secondary | ICD-10-CM | POA: Diagnosis not present

## 2020-10-03 DIAGNOSIS — E1129 Type 2 diabetes mellitus with other diabetic kidney complication: Secondary | ICD-10-CM | POA: Diagnosis not present

## 2020-10-03 DIAGNOSIS — N2581 Secondary hyperparathyroidism of renal origin: Secondary | ICD-10-CM | POA: Diagnosis not present

## 2020-10-03 DIAGNOSIS — N186 End stage renal disease: Secondary | ICD-10-CM | POA: Diagnosis not present

## 2020-10-05 DIAGNOSIS — L299 Pruritus, unspecified: Secondary | ICD-10-CM | POA: Diagnosis not present

## 2020-10-05 DIAGNOSIS — N186 End stage renal disease: Secondary | ICD-10-CM | POA: Diagnosis not present

## 2020-10-05 DIAGNOSIS — D689 Coagulation defect, unspecified: Secondary | ICD-10-CM | POA: Diagnosis not present

## 2020-10-05 DIAGNOSIS — N2581 Secondary hyperparathyroidism of renal origin: Secondary | ICD-10-CM | POA: Diagnosis not present

## 2020-10-05 DIAGNOSIS — R52 Pain, unspecified: Secondary | ICD-10-CM | POA: Diagnosis not present

## 2020-10-05 DIAGNOSIS — E1129 Type 2 diabetes mellitus with other diabetic kidney complication: Secondary | ICD-10-CM | POA: Diagnosis not present

## 2020-10-05 DIAGNOSIS — Z992 Dependence on renal dialysis: Secondary | ICD-10-CM | POA: Diagnosis not present

## 2020-10-07 DIAGNOSIS — Z992 Dependence on renal dialysis: Secondary | ICD-10-CM | POA: Diagnosis not present

## 2020-10-07 DIAGNOSIS — N2581 Secondary hyperparathyroidism of renal origin: Secondary | ICD-10-CM | POA: Diagnosis not present

## 2020-10-07 DIAGNOSIS — N186 End stage renal disease: Secondary | ICD-10-CM | POA: Diagnosis not present

## 2020-10-07 DIAGNOSIS — R52 Pain, unspecified: Secondary | ICD-10-CM | POA: Diagnosis not present

## 2020-10-07 DIAGNOSIS — D689 Coagulation defect, unspecified: Secondary | ICD-10-CM | POA: Diagnosis not present

## 2020-10-07 DIAGNOSIS — E1129 Type 2 diabetes mellitus with other diabetic kidney complication: Secondary | ICD-10-CM | POA: Diagnosis not present

## 2020-10-07 DIAGNOSIS — L299 Pruritus, unspecified: Secondary | ICD-10-CM | POA: Diagnosis not present

## 2020-10-10 DIAGNOSIS — Z992 Dependence on renal dialysis: Secondary | ICD-10-CM | POA: Diagnosis not present

## 2020-10-10 DIAGNOSIS — R52 Pain, unspecified: Secondary | ICD-10-CM | POA: Diagnosis not present

## 2020-10-10 DIAGNOSIS — N2581 Secondary hyperparathyroidism of renal origin: Secondary | ICD-10-CM | POA: Diagnosis not present

## 2020-10-10 DIAGNOSIS — E1129 Type 2 diabetes mellitus with other diabetic kidney complication: Secondary | ICD-10-CM | POA: Diagnosis not present

## 2020-10-10 DIAGNOSIS — D689 Coagulation defect, unspecified: Secondary | ICD-10-CM | POA: Diagnosis not present

## 2020-10-10 DIAGNOSIS — L299 Pruritus, unspecified: Secondary | ICD-10-CM | POA: Diagnosis not present

## 2020-10-10 DIAGNOSIS — N186 End stage renal disease: Secondary | ICD-10-CM | POA: Diagnosis not present

## 2020-10-12 DIAGNOSIS — R52 Pain, unspecified: Secondary | ICD-10-CM | POA: Diagnosis not present

## 2020-10-12 DIAGNOSIS — N186 End stage renal disease: Secondary | ICD-10-CM | POA: Diagnosis not present

## 2020-10-12 DIAGNOSIS — D689 Coagulation defect, unspecified: Secondary | ICD-10-CM | POA: Diagnosis not present

## 2020-10-12 DIAGNOSIS — N2581 Secondary hyperparathyroidism of renal origin: Secondary | ICD-10-CM | POA: Diagnosis not present

## 2020-10-12 DIAGNOSIS — E1129 Type 2 diabetes mellitus with other diabetic kidney complication: Secondary | ICD-10-CM | POA: Diagnosis not present

## 2020-10-12 DIAGNOSIS — L299 Pruritus, unspecified: Secondary | ICD-10-CM | POA: Diagnosis not present

## 2020-10-12 DIAGNOSIS — Z992 Dependence on renal dialysis: Secondary | ICD-10-CM | POA: Diagnosis not present

## 2020-10-14 DIAGNOSIS — D689 Coagulation defect, unspecified: Secondary | ICD-10-CM | POA: Diagnosis not present

## 2020-10-14 DIAGNOSIS — E1129 Type 2 diabetes mellitus with other diabetic kidney complication: Secondary | ICD-10-CM | POA: Diagnosis not present

## 2020-10-14 DIAGNOSIS — Z992 Dependence on renal dialysis: Secondary | ICD-10-CM | POA: Diagnosis not present

## 2020-10-14 DIAGNOSIS — L299 Pruritus, unspecified: Secondary | ICD-10-CM | POA: Diagnosis not present

## 2020-10-14 DIAGNOSIS — N186 End stage renal disease: Secondary | ICD-10-CM | POA: Diagnosis not present

## 2020-10-14 DIAGNOSIS — N2581 Secondary hyperparathyroidism of renal origin: Secondary | ICD-10-CM | POA: Diagnosis not present

## 2020-10-14 DIAGNOSIS — R52 Pain, unspecified: Secondary | ICD-10-CM | POA: Diagnosis not present

## 2020-10-16 DIAGNOSIS — N186 End stage renal disease: Secondary | ICD-10-CM | POA: Diagnosis not present

## 2020-10-17 DIAGNOSIS — L299 Pruritus, unspecified: Secondary | ICD-10-CM | POA: Diagnosis not present

## 2020-10-17 DIAGNOSIS — Z992 Dependence on renal dialysis: Secondary | ICD-10-CM | POA: Diagnosis not present

## 2020-10-17 DIAGNOSIS — D689 Coagulation defect, unspecified: Secondary | ICD-10-CM | POA: Diagnosis not present

## 2020-10-17 DIAGNOSIS — N2581 Secondary hyperparathyroidism of renal origin: Secondary | ICD-10-CM | POA: Diagnosis not present

## 2020-10-17 DIAGNOSIS — N186 End stage renal disease: Secondary | ICD-10-CM | POA: Diagnosis not present

## 2020-10-17 DIAGNOSIS — E1129 Type 2 diabetes mellitus with other diabetic kidney complication: Secondary | ICD-10-CM | POA: Diagnosis not present

## 2020-10-17 DIAGNOSIS — R52 Pain, unspecified: Secondary | ICD-10-CM | POA: Diagnosis not present

## 2020-10-19 DIAGNOSIS — Z992 Dependence on renal dialysis: Secondary | ICD-10-CM | POA: Diagnosis not present

## 2020-10-19 DIAGNOSIS — E1129 Type 2 diabetes mellitus with other diabetic kidney complication: Secondary | ICD-10-CM | POA: Diagnosis not present

## 2020-10-19 DIAGNOSIS — L299 Pruritus, unspecified: Secondary | ICD-10-CM | POA: Diagnosis not present

## 2020-10-19 DIAGNOSIS — N2581 Secondary hyperparathyroidism of renal origin: Secondary | ICD-10-CM | POA: Diagnosis not present

## 2020-10-19 DIAGNOSIS — D689 Coagulation defect, unspecified: Secondary | ICD-10-CM | POA: Diagnosis not present

## 2020-10-19 DIAGNOSIS — R52 Pain, unspecified: Secondary | ICD-10-CM | POA: Diagnosis not present

## 2020-10-19 DIAGNOSIS — N186 End stage renal disease: Secondary | ICD-10-CM | POA: Diagnosis not present

## 2020-10-21 DIAGNOSIS — D689 Coagulation defect, unspecified: Secondary | ICD-10-CM | POA: Diagnosis not present

## 2020-10-21 DIAGNOSIS — N2581 Secondary hyperparathyroidism of renal origin: Secondary | ICD-10-CM | POA: Diagnosis not present

## 2020-10-21 DIAGNOSIS — N186 End stage renal disease: Secondary | ICD-10-CM | POA: Diagnosis not present

## 2020-10-21 DIAGNOSIS — Z992 Dependence on renal dialysis: Secondary | ICD-10-CM | POA: Diagnosis not present

## 2020-10-21 DIAGNOSIS — E1129 Type 2 diabetes mellitus with other diabetic kidney complication: Secondary | ICD-10-CM | POA: Diagnosis not present

## 2020-10-21 DIAGNOSIS — R52 Pain, unspecified: Secondary | ICD-10-CM | POA: Diagnosis not present

## 2020-10-21 DIAGNOSIS — L299 Pruritus, unspecified: Secondary | ICD-10-CM | POA: Diagnosis not present

## 2020-10-22 DIAGNOSIS — Z992 Dependence on renal dialysis: Secondary | ICD-10-CM | POA: Diagnosis not present

## 2020-10-22 DIAGNOSIS — N186 End stage renal disease: Secondary | ICD-10-CM | POA: Diagnosis not present

## 2020-10-22 DIAGNOSIS — E1122 Type 2 diabetes mellitus with diabetic chronic kidney disease: Secondary | ICD-10-CM | POA: Diagnosis not present

## 2020-10-24 DIAGNOSIS — D689 Coagulation defect, unspecified: Secondary | ICD-10-CM | POA: Diagnosis not present

## 2020-10-24 DIAGNOSIS — N186 End stage renal disease: Secondary | ICD-10-CM | POA: Diagnosis not present

## 2020-10-24 DIAGNOSIS — R52 Pain, unspecified: Secondary | ICD-10-CM | POA: Diagnosis not present

## 2020-10-24 DIAGNOSIS — N2581 Secondary hyperparathyroidism of renal origin: Secondary | ICD-10-CM | POA: Diagnosis not present

## 2020-10-24 DIAGNOSIS — E1129 Type 2 diabetes mellitus with other diabetic kidney complication: Secondary | ICD-10-CM | POA: Diagnosis not present

## 2020-10-24 DIAGNOSIS — Z992 Dependence on renal dialysis: Secondary | ICD-10-CM | POA: Diagnosis not present

## 2020-10-24 DIAGNOSIS — R197 Diarrhea, unspecified: Secondary | ICD-10-CM | POA: Diagnosis not present

## 2020-10-24 DIAGNOSIS — Z23 Encounter for immunization: Secondary | ICD-10-CM | POA: Diagnosis not present

## 2020-10-26 DIAGNOSIS — R197 Diarrhea, unspecified: Secondary | ICD-10-CM | POA: Diagnosis not present

## 2020-10-26 DIAGNOSIS — N186 End stage renal disease: Secondary | ICD-10-CM | POA: Diagnosis not present

## 2020-10-26 DIAGNOSIS — E1129 Type 2 diabetes mellitus with other diabetic kidney complication: Secondary | ICD-10-CM | POA: Diagnosis not present

## 2020-10-26 DIAGNOSIS — Z992 Dependence on renal dialysis: Secondary | ICD-10-CM | POA: Diagnosis not present

## 2020-10-26 DIAGNOSIS — D689 Coagulation defect, unspecified: Secondary | ICD-10-CM | POA: Diagnosis not present

## 2020-10-26 DIAGNOSIS — Z23 Encounter for immunization: Secondary | ICD-10-CM | POA: Diagnosis not present

## 2020-10-26 DIAGNOSIS — R52 Pain, unspecified: Secondary | ICD-10-CM | POA: Diagnosis not present

## 2020-10-26 DIAGNOSIS — N2581 Secondary hyperparathyroidism of renal origin: Secondary | ICD-10-CM | POA: Diagnosis not present

## 2020-10-28 DIAGNOSIS — R52 Pain, unspecified: Secondary | ICD-10-CM | POA: Diagnosis not present

## 2020-10-28 DIAGNOSIS — Z23 Encounter for immunization: Secondary | ICD-10-CM | POA: Diagnosis not present

## 2020-10-28 DIAGNOSIS — Z992 Dependence on renal dialysis: Secondary | ICD-10-CM | POA: Diagnosis not present

## 2020-10-28 DIAGNOSIS — N2581 Secondary hyperparathyroidism of renal origin: Secondary | ICD-10-CM | POA: Diagnosis not present

## 2020-10-28 DIAGNOSIS — R197 Diarrhea, unspecified: Secondary | ICD-10-CM | POA: Diagnosis not present

## 2020-10-28 DIAGNOSIS — N186 End stage renal disease: Secondary | ICD-10-CM | POA: Diagnosis not present

## 2020-10-28 DIAGNOSIS — E1129 Type 2 diabetes mellitus with other diabetic kidney complication: Secondary | ICD-10-CM | POA: Diagnosis not present

## 2020-10-28 DIAGNOSIS — D689 Coagulation defect, unspecified: Secondary | ICD-10-CM | POA: Diagnosis not present

## 2020-10-31 DIAGNOSIS — N2581 Secondary hyperparathyroidism of renal origin: Secondary | ICD-10-CM | POA: Diagnosis not present

## 2020-10-31 DIAGNOSIS — Z23 Encounter for immunization: Secondary | ICD-10-CM | POA: Diagnosis not present

## 2020-10-31 DIAGNOSIS — D689 Coagulation defect, unspecified: Secondary | ICD-10-CM | POA: Diagnosis not present

## 2020-10-31 DIAGNOSIS — Z992 Dependence on renal dialysis: Secondary | ICD-10-CM | POA: Diagnosis not present

## 2020-10-31 DIAGNOSIS — N186 End stage renal disease: Secondary | ICD-10-CM | POA: Diagnosis not present

## 2020-10-31 DIAGNOSIS — E1129 Type 2 diabetes mellitus with other diabetic kidney complication: Secondary | ICD-10-CM | POA: Diagnosis not present

## 2020-10-31 DIAGNOSIS — R52 Pain, unspecified: Secondary | ICD-10-CM | POA: Diagnosis not present

## 2020-10-31 DIAGNOSIS — R197 Diarrhea, unspecified: Secondary | ICD-10-CM | POA: Diagnosis not present

## 2020-11-02 DIAGNOSIS — E1129 Type 2 diabetes mellitus with other diabetic kidney complication: Secondary | ICD-10-CM | POA: Diagnosis not present

## 2020-11-02 DIAGNOSIS — R197 Diarrhea, unspecified: Secondary | ICD-10-CM | POA: Diagnosis not present

## 2020-11-02 DIAGNOSIS — Z992 Dependence on renal dialysis: Secondary | ICD-10-CM | POA: Diagnosis not present

## 2020-11-02 DIAGNOSIS — Z23 Encounter for immunization: Secondary | ICD-10-CM | POA: Diagnosis not present

## 2020-11-02 DIAGNOSIS — R52 Pain, unspecified: Secondary | ICD-10-CM | POA: Diagnosis not present

## 2020-11-02 DIAGNOSIS — N186 End stage renal disease: Secondary | ICD-10-CM | POA: Diagnosis not present

## 2020-11-02 DIAGNOSIS — D689 Coagulation defect, unspecified: Secondary | ICD-10-CM | POA: Diagnosis not present

## 2020-11-02 DIAGNOSIS — N2581 Secondary hyperparathyroidism of renal origin: Secondary | ICD-10-CM | POA: Diagnosis not present

## 2020-11-04 DIAGNOSIS — R197 Diarrhea, unspecified: Secondary | ICD-10-CM | POA: Diagnosis not present

## 2020-11-04 DIAGNOSIS — D689 Coagulation defect, unspecified: Secondary | ICD-10-CM | POA: Diagnosis not present

## 2020-11-04 DIAGNOSIS — R52 Pain, unspecified: Secondary | ICD-10-CM | POA: Diagnosis not present

## 2020-11-04 DIAGNOSIS — Z992 Dependence on renal dialysis: Secondary | ICD-10-CM | POA: Diagnosis not present

## 2020-11-04 DIAGNOSIS — N2581 Secondary hyperparathyroidism of renal origin: Secondary | ICD-10-CM | POA: Diagnosis not present

## 2020-11-04 DIAGNOSIS — N186 End stage renal disease: Secondary | ICD-10-CM | POA: Diagnosis not present

## 2020-11-04 DIAGNOSIS — Z23 Encounter for immunization: Secondary | ICD-10-CM | POA: Diagnosis not present

## 2020-11-04 DIAGNOSIS — E1129 Type 2 diabetes mellitus with other diabetic kidney complication: Secondary | ICD-10-CM | POA: Diagnosis not present

## 2020-11-07 DIAGNOSIS — R197 Diarrhea, unspecified: Secondary | ICD-10-CM | POA: Diagnosis not present

## 2020-11-07 DIAGNOSIS — D689 Coagulation defect, unspecified: Secondary | ICD-10-CM | POA: Diagnosis not present

## 2020-11-07 DIAGNOSIS — E1129 Type 2 diabetes mellitus with other diabetic kidney complication: Secondary | ICD-10-CM | POA: Diagnosis not present

## 2020-11-07 DIAGNOSIS — R52 Pain, unspecified: Secondary | ICD-10-CM | POA: Diagnosis not present

## 2020-11-07 DIAGNOSIS — N2581 Secondary hyperparathyroidism of renal origin: Secondary | ICD-10-CM | POA: Diagnosis not present

## 2020-11-07 DIAGNOSIS — Z23 Encounter for immunization: Secondary | ICD-10-CM | POA: Diagnosis not present

## 2020-11-07 DIAGNOSIS — Z992 Dependence on renal dialysis: Secondary | ICD-10-CM | POA: Diagnosis not present

## 2020-11-07 DIAGNOSIS — N186 End stage renal disease: Secondary | ICD-10-CM | POA: Diagnosis not present

## 2020-11-09 DIAGNOSIS — R197 Diarrhea, unspecified: Secondary | ICD-10-CM | POA: Diagnosis not present

## 2020-11-09 DIAGNOSIS — E1129 Type 2 diabetes mellitus with other diabetic kidney complication: Secondary | ICD-10-CM | POA: Diagnosis not present

## 2020-11-09 DIAGNOSIS — R52 Pain, unspecified: Secondary | ICD-10-CM | POA: Diagnosis not present

## 2020-11-09 DIAGNOSIS — D689 Coagulation defect, unspecified: Secondary | ICD-10-CM | POA: Diagnosis not present

## 2020-11-09 DIAGNOSIS — N186 End stage renal disease: Secondary | ICD-10-CM | POA: Diagnosis not present

## 2020-11-09 DIAGNOSIS — Z23 Encounter for immunization: Secondary | ICD-10-CM | POA: Diagnosis not present

## 2020-11-09 DIAGNOSIS — Z992 Dependence on renal dialysis: Secondary | ICD-10-CM | POA: Diagnosis not present

## 2020-11-09 DIAGNOSIS — N2581 Secondary hyperparathyroidism of renal origin: Secondary | ICD-10-CM | POA: Diagnosis not present

## 2020-11-11 DIAGNOSIS — D689 Coagulation defect, unspecified: Secondary | ICD-10-CM | POA: Diagnosis not present

## 2020-11-11 DIAGNOSIS — Z23 Encounter for immunization: Secondary | ICD-10-CM | POA: Diagnosis not present

## 2020-11-11 DIAGNOSIS — R197 Diarrhea, unspecified: Secondary | ICD-10-CM | POA: Diagnosis not present

## 2020-11-11 DIAGNOSIS — Z992 Dependence on renal dialysis: Secondary | ICD-10-CM | POA: Diagnosis not present

## 2020-11-11 DIAGNOSIS — R52 Pain, unspecified: Secondary | ICD-10-CM | POA: Diagnosis not present

## 2020-11-11 DIAGNOSIS — N186 End stage renal disease: Secondary | ICD-10-CM | POA: Diagnosis not present

## 2020-11-11 DIAGNOSIS — N2581 Secondary hyperparathyroidism of renal origin: Secondary | ICD-10-CM | POA: Diagnosis not present

## 2020-11-11 DIAGNOSIS — E1129 Type 2 diabetes mellitus with other diabetic kidney complication: Secondary | ICD-10-CM | POA: Diagnosis not present

## 2020-11-14 DIAGNOSIS — R52 Pain, unspecified: Secondary | ICD-10-CM | POA: Diagnosis not present

## 2020-11-14 DIAGNOSIS — N2581 Secondary hyperparathyroidism of renal origin: Secondary | ICD-10-CM | POA: Diagnosis not present

## 2020-11-14 DIAGNOSIS — Z23 Encounter for immunization: Secondary | ICD-10-CM | POA: Diagnosis not present

## 2020-11-14 DIAGNOSIS — Z992 Dependence on renal dialysis: Secondary | ICD-10-CM | POA: Diagnosis not present

## 2020-11-14 DIAGNOSIS — R197 Diarrhea, unspecified: Secondary | ICD-10-CM | POA: Diagnosis not present

## 2020-11-14 DIAGNOSIS — N186 End stage renal disease: Secondary | ICD-10-CM | POA: Diagnosis not present

## 2020-11-14 DIAGNOSIS — E1129 Type 2 diabetes mellitus with other diabetic kidney complication: Secondary | ICD-10-CM | POA: Diagnosis not present

## 2020-11-14 DIAGNOSIS — D689 Coagulation defect, unspecified: Secondary | ICD-10-CM | POA: Diagnosis not present

## 2020-11-16 DIAGNOSIS — D689 Coagulation defect, unspecified: Secondary | ICD-10-CM | POA: Diagnosis not present

## 2020-11-16 DIAGNOSIS — R52 Pain, unspecified: Secondary | ICD-10-CM | POA: Diagnosis not present

## 2020-11-16 DIAGNOSIS — N2581 Secondary hyperparathyroidism of renal origin: Secondary | ICD-10-CM | POA: Diagnosis not present

## 2020-11-16 DIAGNOSIS — R197 Diarrhea, unspecified: Secondary | ICD-10-CM | POA: Diagnosis not present

## 2020-11-16 DIAGNOSIS — N186 End stage renal disease: Secondary | ICD-10-CM | POA: Diagnosis not present

## 2020-11-16 DIAGNOSIS — E1129 Type 2 diabetes mellitus with other diabetic kidney complication: Secondary | ICD-10-CM | POA: Diagnosis not present

## 2020-11-16 DIAGNOSIS — Z23 Encounter for immunization: Secondary | ICD-10-CM | POA: Diagnosis not present

## 2020-11-16 DIAGNOSIS — Z992 Dependence on renal dialysis: Secondary | ICD-10-CM | POA: Diagnosis not present

## 2020-11-18 DIAGNOSIS — N2581 Secondary hyperparathyroidism of renal origin: Secondary | ICD-10-CM | POA: Diagnosis not present

## 2020-11-18 DIAGNOSIS — E1129 Type 2 diabetes mellitus with other diabetic kidney complication: Secondary | ICD-10-CM | POA: Diagnosis not present

## 2020-11-18 DIAGNOSIS — R197 Diarrhea, unspecified: Secondary | ICD-10-CM | POA: Diagnosis not present

## 2020-11-18 DIAGNOSIS — D689 Coagulation defect, unspecified: Secondary | ICD-10-CM | POA: Diagnosis not present

## 2020-11-18 DIAGNOSIS — N186 End stage renal disease: Secondary | ICD-10-CM | POA: Diagnosis not present

## 2020-11-18 DIAGNOSIS — Z992 Dependence on renal dialysis: Secondary | ICD-10-CM | POA: Diagnosis not present

## 2020-11-18 DIAGNOSIS — R52 Pain, unspecified: Secondary | ICD-10-CM | POA: Diagnosis not present

## 2020-11-18 DIAGNOSIS — Z23 Encounter for immunization: Secondary | ICD-10-CM | POA: Diagnosis not present

## 2020-11-21 DIAGNOSIS — N2581 Secondary hyperparathyroidism of renal origin: Secondary | ICD-10-CM | POA: Diagnosis not present

## 2020-11-21 DIAGNOSIS — E1129 Type 2 diabetes mellitus with other diabetic kidney complication: Secondary | ICD-10-CM | POA: Diagnosis not present

## 2020-11-21 DIAGNOSIS — D689 Coagulation defect, unspecified: Secondary | ICD-10-CM | POA: Diagnosis not present

## 2020-11-21 DIAGNOSIS — R197 Diarrhea, unspecified: Secondary | ICD-10-CM | POA: Diagnosis not present

## 2020-11-21 DIAGNOSIS — N186 End stage renal disease: Secondary | ICD-10-CM | POA: Diagnosis not present

## 2020-11-21 DIAGNOSIS — R52 Pain, unspecified: Secondary | ICD-10-CM | POA: Diagnosis not present

## 2020-11-21 DIAGNOSIS — Z992 Dependence on renal dialysis: Secondary | ICD-10-CM | POA: Diagnosis not present

## 2020-11-21 DIAGNOSIS — Z23 Encounter for immunization: Secondary | ICD-10-CM | POA: Diagnosis not present

## 2020-11-22 DIAGNOSIS — Z992 Dependence on renal dialysis: Secondary | ICD-10-CM | POA: Diagnosis not present

## 2020-11-22 DIAGNOSIS — E1122 Type 2 diabetes mellitus with diabetic chronic kidney disease: Secondary | ICD-10-CM | POA: Diagnosis not present

## 2020-11-22 DIAGNOSIS — N186 End stage renal disease: Secondary | ICD-10-CM | POA: Diagnosis not present

## 2020-11-23 DIAGNOSIS — R52 Pain, unspecified: Secondary | ICD-10-CM | POA: Diagnosis not present

## 2020-11-23 DIAGNOSIS — D689 Coagulation defect, unspecified: Secondary | ICD-10-CM | POA: Diagnosis not present

## 2020-11-23 DIAGNOSIS — N186 End stage renal disease: Secondary | ICD-10-CM | POA: Diagnosis not present

## 2020-11-23 DIAGNOSIS — N2581 Secondary hyperparathyroidism of renal origin: Secondary | ICD-10-CM | POA: Diagnosis not present

## 2020-11-23 DIAGNOSIS — Z992 Dependence on renal dialysis: Secondary | ICD-10-CM | POA: Diagnosis not present

## 2020-11-23 DIAGNOSIS — E1129 Type 2 diabetes mellitus with other diabetic kidney complication: Secondary | ICD-10-CM | POA: Diagnosis not present

## 2020-11-25 DIAGNOSIS — E1129 Type 2 diabetes mellitus with other diabetic kidney complication: Secondary | ICD-10-CM | POA: Diagnosis not present

## 2020-11-25 DIAGNOSIS — N2581 Secondary hyperparathyroidism of renal origin: Secondary | ICD-10-CM | POA: Diagnosis not present

## 2020-11-25 DIAGNOSIS — R52 Pain, unspecified: Secondary | ICD-10-CM | POA: Diagnosis not present

## 2020-11-25 DIAGNOSIS — N186 End stage renal disease: Secondary | ICD-10-CM | POA: Diagnosis not present

## 2020-11-25 DIAGNOSIS — D689 Coagulation defect, unspecified: Secondary | ICD-10-CM | POA: Diagnosis not present

## 2020-11-25 DIAGNOSIS — Z992 Dependence on renal dialysis: Secondary | ICD-10-CM | POA: Diagnosis not present

## 2020-11-28 DIAGNOSIS — R52 Pain, unspecified: Secondary | ICD-10-CM | POA: Diagnosis not present

## 2020-11-28 DIAGNOSIS — N186 End stage renal disease: Secondary | ICD-10-CM | POA: Diagnosis not present

## 2020-11-28 DIAGNOSIS — E1129 Type 2 diabetes mellitus with other diabetic kidney complication: Secondary | ICD-10-CM | POA: Diagnosis not present

## 2020-11-28 DIAGNOSIS — N2581 Secondary hyperparathyroidism of renal origin: Secondary | ICD-10-CM | POA: Diagnosis not present

## 2020-11-28 DIAGNOSIS — D689 Coagulation defect, unspecified: Secondary | ICD-10-CM | POA: Diagnosis not present

## 2020-11-28 DIAGNOSIS — Z992 Dependence on renal dialysis: Secondary | ICD-10-CM | POA: Diagnosis not present

## 2020-11-30 DIAGNOSIS — N2581 Secondary hyperparathyroidism of renal origin: Secondary | ICD-10-CM | POA: Diagnosis not present

## 2020-11-30 DIAGNOSIS — Z992 Dependence on renal dialysis: Secondary | ICD-10-CM | POA: Diagnosis not present

## 2020-11-30 DIAGNOSIS — R52 Pain, unspecified: Secondary | ICD-10-CM | POA: Diagnosis not present

## 2020-11-30 DIAGNOSIS — D689 Coagulation defect, unspecified: Secondary | ICD-10-CM | POA: Diagnosis not present

## 2020-11-30 DIAGNOSIS — N186 End stage renal disease: Secondary | ICD-10-CM | POA: Diagnosis not present

## 2020-11-30 DIAGNOSIS — E1129 Type 2 diabetes mellitus with other diabetic kidney complication: Secondary | ICD-10-CM | POA: Diagnosis not present

## 2020-12-01 ENCOUNTER — Ambulatory Visit (INDEPENDENT_AMBULATORY_CARE_PROVIDER_SITE_OTHER): Payer: Medicare Other | Admitting: Ophthalmology

## 2020-12-01 ENCOUNTER — Encounter (INDEPENDENT_AMBULATORY_CARE_PROVIDER_SITE_OTHER): Payer: Self-pay | Admitting: Ophthalmology

## 2020-12-01 ENCOUNTER — Other Ambulatory Visit: Payer: Self-pay

## 2020-12-01 DIAGNOSIS — I10 Essential (primary) hypertension: Secondary | ICD-10-CM

## 2020-12-01 DIAGNOSIS — H3581 Retinal edema: Secondary | ICD-10-CM

## 2020-12-01 DIAGNOSIS — H4312 Vitreous hemorrhage, left eye: Secondary | ICD-10-CM

## 2020-12-01 DIAGNOSIS — H43812 Vitreous degeneration, left eye: Secondary | ICD-10-CM | POA: Diagnosis not present

## 2020-12-01 DIAGNOSIS — E113513 Type 2 diabetes mellitus with proliferative diabetic retinopathy with macular edema, bilateral: Secondary | ICD-10-CM | POA: Diagnosis not present

## 2020-12-01 DIAGNOSIS — H35033 Hypertensive retinopathy, bilateral: Secondary | ICD-10-CM | POA: Diagnosis not present

## 2020-12-01 DIAGNOSIS — H4313 Vitreous hemorrhage, bilateral: Secondary | ICD-10-CM | POA: Diagnosis not present

## 2020-12-01 DIAGNOSIS — H25813 Combined forms of age-related cataract, bilateral: Secondary | ICD-10-CM

## 2020-12-01 DIAGNOSIS — H4311 Vitreous hemorrhage, right eye: Secondary | ICD-10-CM

## 2020-12-01 MED ORDER — BEVACIZUMAB CHEMO INJECTION 1.25MG/0.05ML SYRINGE FOR KALEIDOSCOPE
1.2500 mg | INTRAVITREAL | Status: AC | PRN
  Administered 2020-12-01: 1.25 mg via INTRAVITREAL

## 2020-12-01 NOTE — Progress Notes (Signed)
[facility] Note  12/01/2020     CHIEF COMPLAINT Patient presents for Retina Follow Up   HISTORY OF PRESENT ILLNESS: Jonathan Mcneil is a 64 y.o. male who presents to the clinic today for:   HPI     Retina Follow Up   Patient presents with  Diabetic Retinopathy.  In left eye.  Severity is moderate.  Since onset it is gradually worsening.  I, the attending physician,  performed the HPI with the patient and updated documentation appropriately.        Comments   Pt states about 4 weeks ago the vision in his left eye became blurry, right eye is okay, pt is no longer taking medication for BP or diabetes, pt only has BG checked at dialysis, last A1c was 5.5, pt states he is seeing black spots in his vision      Last edited by Bernarda Caffey, MD on 12/01/2020 12:32 PM.     pt states about 3-4 weeks ago, while at dialysis, he noticed new black floaters in his left eye, he states it has not gotten any better since then, he notices occasional fol  Referring physician: Merrilee Seashore, MD [place],  [place] [id number]  HISTORICAL INFORMATION:   Selected notes from the [facility] Referred by Dr. Maryjane Hurter for heme OD   CURRENT MEDICATIONS: No current outpatient medications on file. (Ophthalmic Drugs)   No current facility-administered medications for this visit. (Ophthalmic Drugs)   Current Outpatient Medications (Other)  Medication Sig   acetaminophen (TYLENOL) 650 MG CR tablet Take 1,300 mg by mouth 2 (two) times daily as needed for pain.   amLODipine (NORVASC) 10 MG tablet Take 10 mg by mouth at bedtime.  (Patient not taking: Reported on 08/28/2020)   azithromycin (ZITHROMAX Z-PAK) 250 MG tablet Take 1 tablet (250 mg total) by mouth daily. (Patient not taking: No sig reported)   benzonatate (TESSALON) 100 MG capsule Take 1 capsule (100 mg total) by mouth every 8 (eight) hours. (Patient not taking: No sig  reported)   calcitRIOL (ROCALTROL) 0.25 MCG capsule Take 1 capsule (0.25 mcg total) by mouth every Monday, Wednesday, and Friday with hemodialysis. (Patient not taking: No sig reported)   Multiple Vitamins-Minerals (MULTIVITAMIN) LIQD Take 32 oz by mouth daily.   pantoprazole (PROTONIX) 20 MG tablet Take 1 tablet (20 mg total) by mouth daily. (Patient not taking: No sig reported)   pantoprazole (PROTONIX) 40 MG tablet Take 40 mg by mouth daily. (Patient not taking: No sig reported)   promethazine (PHENERGAN) 25 MG suppository Place 1 suppository (25 mg total) rectally every 8 (eight) hours as needed for nausea or vomiting. (Patient not taking: No sig reported)   promethazine (PHENERGAN) 25 MG tablet Take 25 mg by mouth 3 (three) times daily. (Patient not taking: No sig reported)   sucralfate (CARAFATE) 1 g tablet Take 1 tablet (1 g total) by mouth 4 (four) times daily -  with meals and at bedtime. (Patient not taking: No sig reported)   VELPHORO 500 MG chewable tablet Chew 500 mg by mouth 3 (three) times daily. (Patient not taking: No sig reported)   No current facility-administered medications for this visit. (Other)      REVIEW OF SYSTEMS:     ALLERGIES Allergies  Allergen Reactions   Ibuprofen Other (See Comments)    Unknown reaction   Penicillins Itching and Other (See Comments)    Has patient  had a PCN reaction causing immediate rash, facial/tongue/throat swelling, SOB or lightheadedness with hypotension: No Has patient had a PCN reaction causing severe rash involving mucus membranes or skin necrosis: No Has patient had a PCN reaction that required hospitalization No Has patient had a PCN reaction occurring within the last 10 years: Unknown If all of the above answers are "NO", then may proceed with Cephalosporin use.    PAST MEDICAL HISTORY Past Medical History:  Diagnosis Date   Diabetes mellitus without complication ([place])    ESRD (end stage renal disease) ([place])    GERD  (gastroesophageal reflux disease)    PMH   Hypertension    Low back pain    Metabolic acidosis    Neuromuscular disorder ([place])    peripheral neuropathy   Pancreatitis    Schizophrenia ([place])    does not take medications   Past Surgical History:  Procedure Laterality Date   A/V FISTULAGRAM Left 06/03/2018   Procedure: A/V FISTULAGRAM;  Surgeon: Marty Heck, MD;  Location: [facility];  Service: Cardiovascular;  Laterality: Left;   AV FISTULA PLACEMENT Left 02/11/2018   Procedure: INSERTION OF ARTERIOVENOUS (AV) FISTULA LEFT  ARM;  Surgeon: [name], MD;  Location: [facility];  Service: Vascular;  Laterality: Left;   [place] Left 04/17/2018   Procedure: BASILIC VEIN TRANSPOSITION SECOND STAGE;  Surgeon: [name], MD;  Location: [facility];  Service: Vascular;  Laterality: Left;   BIOPSY  10/20/2019   Procedure: BIOPSY;  Surgeon: Clarene Essex, MD;  Location: [facility];  Service: Endoscopy;;   COLONOSCOPY     DENTAL SURGERY     ESOPHAGOGASTRODUODENOSCOPY (EGD) WITH PROPOFOL N/A 10/20/2019   Procedure: ESOPHAGOGASTRODUODENOSCOPY (EGD) WITH PROPOFOL;  Surgeon: Clarene Essex, MD;  Location: [facility];  Service: Endoscopy;  Laterality: N/A;   INSERTION OF DIALYSIS CATHETER Right 02/25/2018   Procedure: INSERTION OF DIALYSIS CATHETER;  Surgeon: [name], MD;  Location: [facility];  Service: Vascular;  Laterality: Right;   PERIPHERAL VASCULAR BALLOON ANGIOPLASTY  06/03/2018   Procedure: PERIPHERAL VASCULAR BALLOON ANGIOPLASTY;  Surgeon: Marty Heck, MD;  Location: [facility];  Service: Cardiovascular;;  left a/v fistula    FAMILY HISTORY Family History  Problem Relation Age of Onset   Kidney failure Mother    Diabetes Father    Blindness Brother     SOCIAL HISTORY Social History   Tobacco Use   Smoking status: Former   Smokeless tobacco: Never  Scientific laboratory technician Use: Never used  Substance Use  Topics   Alcohol use: No   Drug use: Not Currently    Types: Cocaine, Marijuana    Comment: smoked marijuana a few days ago; he denies using cocaine         OPHTHALMIC EXAM:  Base Eye Exam     Visual Acuity (Snellen - Linear)       Right Left   Dist [place] 20/25 +2 20/20 -1   Dist ph [place] NI NI         Tonometry (Tonopen, 8:16 AM)       Right Left   Pressure 22 21         Pupils       Dark Light Shape React APD   Right 2 1 Round Brisk None   Left 2 1 Round Brisk None         Visual Fields (Counting fingers)       Left Right  Full Full         Extraocular Movement       Right Left    Full, Ortho Full, Ortho         Neuro/Psych     Oriented x3: Yes   Mood/Affect: Normal         Dilation     Both eyes: 1.0% Mydriacyl, 2.5% Phenylephrine @ 8:16 AM           Slit Lamp and Fundus Exam     Slit Lamp Exam       Right Left   Lids/Lashes Dermatochalasis - upper lid Dermatochalasis - upper lid   Conjunctiva/Sclera Melanosis Melanosis, temporal pinguecula   Cornea 1+Punctate epithelial erosions, mild arcus 1+Punctate epithelial erosions, mild arcus   Anterior Chamber Deep and quiet Deep and quiet   Iris Round and dilated, No NVI Round and dilated, No NVI   Lens 2-3+ Nuclear sclerosis, 2-3+ Cortical cataract 2-3+ Nuclear sclerosis, 2-3+ Cortical cataract   Vitreous Vitreous syneresis, old white VH settled inferiorly Vitreous syneresis, blood stained vitreous condensations         Fundus Exam       Right Left   Disc Mild Pallor, Sharp rim, focal PPP/PPA Mild Pallor, Sharp rim   C/D Ratio 0.4 0.6   Macula Flat, Good foveal reflex, RPE mottling and clumping, no edema, trace MA Flat, Blunted foveal reflex, RPE mottling and clumping, scattered Microaneurysms/DBH greatest temporal macula    Vessels attenuated, mild Tortuousity, mild Copper wiring, nasal NV improved/regressing mild attenuation, mild Copper wiring, IN NV improved/regressing    Periphery Attached, 360 IRH/DBH - improved, 360 PRP with room for fill in Attached, scattered IRH/DBH greatest nasally, good 360 PRP changes, No RT/RD            IMAGING AND PROCEDURES  Imaging and Procedures for 12/01/2020  OCT, Retina - OU - Both Eyes       Right Eye Quality was good. Central Foveal Thickness: 245. Progression has been stable. Findings include no IRF, no SRF, normal foveal contour, intraretinal hyper-reflective material (Mild, persistent vitreous opacities, irregular lamination, mild atrophy).   Left Eye Quality was good. Central Foveal Thickness: 246. Progression has worsened. Findings include normal foveal contour, no SRF, no IRF (Non-central IRF temporal macula caught on widefield, interval release of partial PVD, fine vitreous opacities).   Notes *Images captured and stored on drive  Diagnosis / Impression:  OD: NFP; no IRF/SRF; mild vitreous opacities; irregular lamination, mild atrophy OS: NFP, no IRF/SRF; Non-central IRF temporal macula caught on widefield; interval release of partial PVD, +fine vitreous opacities  Clinical management:  See below  Abbreviations: NFP - Normal foveal profile. CME - cystoid macular edema. PED - pigment epithelial detachment. IRF - intraretinal fluid. SRF - subretinal fluid. EZ - ellipsoid zone. ERM - epiretinal membrane. ORA - outer retinal atrophy. ORT - outer retinal tubulation. SRHM - subretinal hyper-reflective material. IRHM - intraretinal hyper-reflective material      Intravitreal Injection, Pharmacologic Agent - OS - Left Eye       Time Out 12/01/2020. 8:56 AM. Confirmed correct patient, procedure, site, and patient consented.   Anesthesia Topical anesthesia was used. Anesthetic medications included Lidocaine 2%, Proparacaine 0.5%.   Procedure Preparation included 5% betadine to ocular surface, eyelid speculum. A (32g) needle was used.   Injection: 1.25 mg Bevacizumab 1.'25mg'$ /0.61m   Route: Intravitreal,  Site: Left Eye   NDC: [id number] Lot: [id number] , Expiration date: 12/20/2020, Waste: 0 mL   Post-op Post  injection exam found visual acuity of at least counting fingers. The patient tolerated the procedure well. There were no complications. The patient received written and verbal post procedure care education.               ASSESSMENT/PLAN:    ICD-10-CM   1. Posterior vitreous detachment of left eye  H43.812     2. Vitreous hemorrhage of left eye (HCC)  H43.12     3. Proliferative diabetic retinopathy of both eyes with macular edema associated with type 2 diabetes mellitus (HCC)  [id number] Intravitreal Injection, Pharmacologic Agent - OS - Left Eye    Bevacizumab (AVASTIN) SOLN 1.25 mg    4. Retinal edema  H35.81 OCT, Retina - OU - Both Eyes    5. Vitreous hemorrhage, right eye ([place])  H43.11     6. Essential hypertension  I10     7. Hypertensive retinopathy of both eyes  H35.033     8. Combined forms of age-related cataract of both eyes  H25.813      1,2. Hemorrhagic PVD OS  - Onset ~3 wks w/ new onset floaters, no photopsias -- persistent w/ minimal improvement since onset  - Discussed findings and prognosis  - No RT or RD on 360 peripheral exam  - Reviewed s/s of RT/RD  - Strict return precautions for any such RT/RD signs/symptoms  - VH precautions reviewed -- minimize activities, keep head elevated, avoid ASA/NSAIDs/blood thinners as able  - discussed findings and treatment options  - recommend IVA OS to expedite VH clearance -- see below  - F/U 2 weeks   3,4. Proliferative diabetic retinopathy OU (OD > OS) - s/p IVA OD #1 (12.08.21) - s/p IVA OS #1 (12.10.21) - s/p IVA OU #2 (01.11.22), #3 (02.09.22) - FA (12.08.21) shows late-leaking MA, vascular nonperfusion, +NV OU (nasal midzone) - s/p PRP OD (01.26.22) - s/p PRP OS (03.09.22) - BCVA 20/25 OD, 20/20 OS - exam shows preretinal/subhyaloid heme and VH OD improved w/ white heme settled inferiorly, OS with  new blood stained vit condensations from hemorrhagic PVD (see above)  - OCT OD: mild vitreous opacities; irregular lamination, mild atrophy; OS: Non-central IRF temporal macula caught on widefield, interval release of artial PVD, fine vitreous opacities  - repeat FA 6.6.22 shows interval improvement in NVE/leakage OU s/p PRP OU-- just minimal leakage remains  - recommend IVA OS #4 today, 09.09.22 for new VH / hemorrhagic PVD  - pt wishes to proceed with injection  - RBA of procedure discussed, questions answered - informed consent obtained and signed - see procedure note - f/u 3 weeks, sooner prn -- DFE, OCT  5. Vitreous Hemorrhage OD -- improving  - acute onset with violent coughing spell  - underlying etiology related to NV/PDR as described above, but contributed by use of heparin for dialysis  - VH clearing and settling inferiorly -- white blood clots in inferior periphery  - s/p PRP OD (01.26.22)  - VH precautions reviewed -- minimize activities, keep head elevated, avoid ASA/NSAIDs/blood thinners as able  - monitor  6,7. Hypertensive retinopathy OU - discussed importance of tight BP control - monitor  8. Mixed Cataract OU - The symptoms of cataract, surgical options, and treatments and risks were discussed with patient. - discussed diagnosis and progression - not yet visually significant - monitor for now  Ophthalmic Meds Ordered this visit:  Meds ordered this encounter  Medications   Bevacizumab (AVASTIN) SOLN 1.25 mg       Return  in about 3 weeks (around 12/22/2020) for f/u PDR OU, DFE, OCT.  There are no Patient Instructions on file for this visit.  This document serves as a record of services personally performed by Gardiner Sleeper, MD, PhD. It was created on their behalf by San Jetty. Owens Shark, OA an ophthalmic technician. The creation of this record is the provider's dictation and/or activities during the visit.    Electronically signed by: San Jetty. Marguerita Merles  09.09.2022 12:38 PM   Gardiner Sleeper, M.D., Ph.D. Diseases & Surgery of the Retina and Vitreous [facility]  I have reviewed the above documentation for accuracy and completeness, and I agree with the above. Gardiner Sleeper, M.D., Ph.D. 12/01/20 12:38 PM   Abbreviations: M myopia (nearsighted); A astigmatism; H hyperopia (farsighted); P presbyopia; Mrx spectacle prescription;  CTL contact lenses; OD right eye; OS left eye; OU both eyes  XT exotropia; ET esotropia; PEK punctate epithelial keratitis; PEE punctate epithelial erosions; DES dry eye syndrome; MGD meibomian gland dysfunction; ATs artificial tears; PFAT's preservative free artificial tears; [place] nuclear sclerotic cataract; PSC posterior subcapsular cataract; ERM epi-retinal membrane; PVD posterior vitreous detachment; RD retinal detachment; DM diabetes mellitus; DR diabetic retinopathy; NPDR non-proliferative diabetic retinopathy; PDR proliferative diabetic retinopathy; CSME clinically significant macular edema; DME diabetic macular edema; dbh dot blot hemorrhages; CWS cotton wool spot; POAG primary open angle glaucoma; C/D cup-to-disc ratio; HVF humphrey visual field; GVF goldmann visual field; OCT optical coherence tomography; IOP intraocular pressure; BRVO Branch retinal vein occlusion; CRVO central retinal vein occlusion; CRAO central retinal artery occlusion; BRAO branch retinal artery occlusion; RT retinal tear; SB scleral buckle; PPV pars plana vitrectomy; VH Vitreous hemorrhage; PRP panretinal laser photocoagulation; IVK intravitreal kenalog; VMT vitreomacular traction; MH Macular hole;  NVD neovascularization of the disc; NVE neovascularization elsewhere; AREDS age related eye disease study; ARMD age related macular degeneration; POAG primary open angle glaucoma; EBMD epithelial/anterior basement membrane dystrophy; ACIOL anterior chamber intraocular lens; IOL intraocular lens; PCIOL posterior chamber intraocular  lens; Phaco/IOL phacoemulsification with intraocular lens placement; [place] photorefractive keratectomy; LASIK laser assisted in situ keratomileusis; HTN hypertension; DM diabetes mellitus; COPD chronic obstructive pulmonary disease

## 2020-12-02 DIAGNOSIS — E1129 Type 2 diabetes mellitus with other diabetic kidney complication: Secondary | ICD-10-CM | POA: Diagnosis not present

## 2020-12-02 DIAGNOSIS — R52 Pain, unspecified: Secondary | ICD-10-CM | POA: Diagnosis not present

## 2020-12-02 DIAGNOSIS — N186 End stage renal disease: Secondary | ICD-10-CM | POA: Diagnosis not present

## 2020-12-02 DIAGNOSIS — D689 Coagulation defect, unspecified: Secondary | ICD-10-CM | POA: Diagnosis not present

## 2020-12-02 DIAGNOSIS — N2581 Secondary hyperparathyroidism of renal origin: Secondary | ICD-10-CM | POA: Diagnosis not present

## 2020-12-02 DIAGNOSIS — Z992 Dependence on renal dialysis: Secondary | ICD-10-CM | POA: Diagnosis not present

## 2020-12-06 ENCOUNTER — Other Ambulatory Visit: Payer: Self-pay

## 2020-12-06 ENCOUNTER — Emergency Department (HOSPITAL_COMMUNITY)
Admission: EM | Admit: 2020-12-06 | Discharge: 2020-12-07 | Disposition: A | Discharge status: Home / Self Care | Payer: Medicare Other | Attending: Emergency Medicine | Admitting: Emergency Medicine

## 2020-12-06 DIAGNOSIS — E875 Hyperkalemia: Secondary | ICD-10-CM | POA: Diagnosis not present

## 2020-12-06 DIAGNOSIS — E1140 Type 2 diabetes mellitus with diabetic neuropathy, unspecified: Secondary | ICD-10-CM | POA: Insufficient documentation

## 2020-12-06 DIAGNOSIS — E1122 Type 2 diabetes mellitus with diabetic chronic kidney disease: Secondary | ICD-10-CM | POA: Insufficient documentation

## 2020-12-06 DIAGNOSIS — Z79899 Other long term (current) drug therapy: Secondary | ICD-10-CM | POA: Insufficient documentation

## 2020-12-06 DIAGNOSIS — I120 Hypertensive chronic kidney disease with stage 5 chronic kidney disease or end stage renal disease: Secondary | ICD-10-CM | POA: Diagnosis not present

## 2020-12-06 DIAGNOSIS — Z20822 Contact with and (suspected) exposure to covid-19: Secondary | ICD-10-CM | POA: Diagnosis not present

## 2020-12-06 DIAGNOSIS — N186 End stage renal disease: Secondary | ICD-10-CM | POA: Insufficient documentation

## 2020-12-06 DIAGNOSIS — Z87891 Personal history of nicotine dependence: Secondary | ICD-10-CM | POA: Diagnosis not present

## 2020-12-06 DIAGNOSIS — Z992 Dependence on renal dialysis: Secondary | ICD-10-CM | POA: Insufficient documentation

## 2020-12-06 DIAGNOSIS — R519 Headache, unspecified: Secondary | ICD-10-CM | POA: Diagnosis not present

## 2020-12-06 DIAGNOSIS — R112 Nausea with vomiting, unspecified: Secondary | ICD-10-CM | POA: Diagnosis not present

## 2020-12-06 DIAGNOSIS — R197 Diarrhea, unspecified: Secondary | ICD-10-CM | POA: Diagnosis not present

## 2020-12-06 LAB — COMPREHENSIVE METABOLIC PANEL
ALT: 18 U/L (ref 0–44)
AST: 17 U/L (ref 15–41)
Albumin: 4.3 g/dL (ref 3.5–5.0)
Alkaline Phosphatase: 190 U/L — ABNORMAL HIGH (ref 38–126)
Anion gap: 19 — ABNORMAL HIGH (ref 5–15)
BUN: 77 mg/dL — ABNORMAL HIGH (ref 8–23)
CO2: 23 mmol/L (ref 22–32)
Calcium: 9.4 mg/dL (ref 8.9–10.3)
Chloride: 90 mmol/L — ABNORMAL LOW (ref 98–111)
Creatinine, Ser: 13.24 mg/dL — ABNORMAL HIGH (ref 0.61–1.24)
GFR, Estimated: 4 mL/min — ABNORMAL LOW (ref >60)
Glucose, Bld: 89 mg/dL (ref 70–99)
Potassium: 7.2 mmol/L (ref 3.5–5.1)
Sodium: 132 mmol/L — ABNORMAL LOW (ref 135–145)
Total Bilirubin: 1.5 mg/dL — ABNORMAL HIGH (ref 0.3–1.2)
Total Protein: 8.5 g/dL — ABNORMAL HIGH (ref 6.5–8.1)

## 2020-12-06 LAB — CBC WITH DIFFERENTIAL/PLATELET
Abs Immature Granulocytes: 0.02 10*3/uL (ref 0.00–0.07)
Basophils Absolute: 0.1 10*3/uL (ref 0.0–0.1)
Basophils Relative: 1 %
Eosinophils Absolute: 0.5 10*3/uL (ref 0.0–0.5)
Eosinophils Relative: 11 %
HCT: 38.2 % — ABNORMAL LOW (ref 39.0–52.0)
Hemoglobin: 12.8 g/dL — ABNORMAL LOW (ref 13.0–17.0)
Immature Granulocytes: 0 %
Lymphocytes Relative: 20 %
Lymphs Abs: 0.9 10*3/uL (ref 0.7–4.0)
MCH: 32.2 pg (ref 26.0–34.0)
MCHC: 33.5 g/dL (ref 30.0–36.0)
MCV: 96 fL (ref 80.0–100.0)
Monocytes Absolute: 0.7 10*3/uL (ref 0.1–1.0)
Monocytes Relative: 14 %
Neutro Abs: 2.5 10*3/uL (ref 1.7–7.7)
Neutrophils Relative %: 54 %
Platelets: 132 10*3/uL — ABNORMAL LOW (ref 150–400)
RBC: 3.98 MIL/uL — ABNORMAL LOW (ref 4.22–5.81)
RDW: 15.1 % (ref 11.5–15.5)
WBC: 4.7 10*3/uL (ref 4.0–10.5)
nRBC: 0 % (ref 0.0–0.2)

## 2020-12-06 LAB — RESP PANEL BY RT-PCR (FLU A&B, COVID) ARPGX2
Influenza A by PCR: NEGATIVE
Influenza B by PCR: NEGATIVE
SARS Coronavirus 2 by RT PCR: NEGATIVE

## 2020-12-06 LAB — CBG MONITORING, ED: Glucose-Capillary: 270 mg/dL — ABNORMAL HIGH (ref 70–99)

## 2020-12-06 MED ORDER — SODIUM ZIRCONIUM CYCLOSILICATE 10 G PO PACK
10.0000 g | PACK | Freq: Once | ORAL | Status: AC
  Administered 2020-12-06: 10 g via ORAL
  Filled 2020-12-06: qty 1

## 2020-12-06 MED ORDER — LOPERAMIDE HCL 2 MG PO CAPS
4.0000 mg | ORAL_CAPSULE | Freq: Once | ORAL | Status: AC
  Administered 2020-12-06: 4 mg via ORAL
  Filled 2020-12-06: qty 2

## 2020-12-06 MED ORDER — CALCIUM GLUCONATE-NACL 1-0.675 GM/50ML-% IV SOLN
1.0000 g | Freq: Once | INTRAVENOUS | Status: AC
  Administered 2020-12-06: 1000 mg via INTRAVENOUS
  Filled 2020-12-06: qty 50

## 2020-12-06 MED ORDER — DEXTROSE 50 % IV SOLN
25.0000 g | Freq: Once | INTRAVENOUS | Status: AC
  Administered 2020-12-06: 25 g via INTRAVENOUS
  Filled 2020-12-06: qty 50

## 2020-12-06 MED ORDER — INSULIN ASPART 100 UNIT/ML IJ SOLN
10.0000 [IU] | Freq: Once | INTRAMUSCULAR | Status: AC
  Administered 2020-12-06: 10 [IU] via INTRAVENOUS

## 2020-12-06 NOTE — ED Provider Notes (Signed)
Emergency Medicine Provider Triage Evaluation Note  Jonathan Mcneil , a 65 y.o. male  was evaluated in triage.  Pt complains of abnormal lab results after missing dialysis.  Reports that he was told by his primary care provider to come to the emergency department due to abnormal lab results.  Patient is unsure what lab result was abnormal.  Patient is a dialysis patient.  Receives treatment Tuesday, Thursday, Saturday.  Patient's last received dialysis on Saturday.  Patient reports that he received COVID-19 in influenza vaccine on Saturday.  States that since then he has had generalized fatigue.  Review of Systems  Positive: Fatigue Negative: Fever, chills  Physical Exam  BP 113/72   Pulse 81   Temp 99.1 F (37.3 C)   Resp 14   SpO2 100%  Gen:   Awake, no distress   Resp:  Normal effort  MSK:   Moves extremities without difficulty  Other:  Fistula to left upper arm, palpable thrill.  Medical Decision Making  Medically screening exam initiated at 3:48 PM.  Appropriate orders placed.  Wessley Roylee Moster was informed that the remainder of the evaluation will be completed by another provider, this initial triage assessment does not replace that evaluation, and the importance of remaining in the ED until their evaluation is complete.  The patient appears stable so that the remainder of the work up may be completed by another provider.      Loni Beckwith, PA-C 12/06/20 1549    Elnora Morrison, MD 12/07/20 [phone]

## 2020-12-06 NOTE — Progress Notes (Signed)
30M ESRD TTS at [facility] with LUE AVF received COVID and flu booster and developed malaise, myalgias, nausea, diarrhea.  Last dialysis treatment 9/10, missed 9/13 because of the above symptoms.  He was seen by PCP, had labs ordered, and sent to the ED for abnormal labs which is likely hyperkalemia.  Here labs notable for potassium of 7.2, bicarbonate 23, hemoglobin 12.8.  Sodium of 132.  EKG without peaked T waves or interval elongation.  Patient treated with calcium gluconate, insulin/dextrose, Lokelma.  At the time of my evaluation feels well.  He thinks that his stomach symptoms are improving. RRR, CTAB. LUE AVF +B/T, s/nt/nd, trace to 1+ LEE, aao x3, nonfocal.  Assessment: 1. Hyperkalemia after missed dialysis treatment 2.  ESRD TTS  Plan HD tonight, 3.5h, 2K, 400/600, no heparin, 1-2L UF Likely ok for DC from ED post HD

## 2020-12-06 NOTE — ED Triage Notes (Signed)
Pt sent by PCP for abnormal blood work results after missing dialysis tx yesterday d/t not feeling well after receiving covid and flu vaccines in the same day prior. Last full tx on Saturday. Pt reports mild fatigue but otherwise has no complaints.

## 2020-12-06 NOTE — ED Notes (Signed)
Pt to toilet again for [place]. Pt requesting something for diarrhea. Will update provider

## 2020-12-06 NOTE — ED Provider Notes (Signed)
[facility] EMERGENCY DEPARTMENT Provider Note  CSN: [id number] Arrival date & time: 12/06/20 1456    History No chief complaint on file.   Jonathan Mcneil is a 65 y.o. male with history of ESRD on HD TTS who reports a normal dialysis session Saturday but afterwards he went and got a Covid and Flu booster. He has been feeling poorly since with body aches, nausea and diarrhea.He did not go to dialysis yesterday (Tuesday) due to feeling so poorly and at his PCP office today he was complaining of feeling weak and had an abnormal lab (presumably K) and sent to the ED for further evaluation. Denies fever or SOB.    Past Medical History:  Diagnosis Date  . Diabetes mellitus without complication ([place])   . ESRD (end stage renal disease) ([place])   . GERD (gastroesophageal reflux disease)    PMH  . Hypertension   . Low back pain   . Metabolic acidosis   . Neuromuscular disorder ([place])    peripheral neuropathy  . Pancreatitis   . Schizophrenia ([place])    does not take medications    Past Surgical History:  Procedure Laterality Date  . A/V FISTULAGRAM Left 06/03/2018   Procedure: A/V FISTULAGRAM;  Surgeon: Marty Heck, MD;  Location: [facility];  Service: Cardiovascular;  Laterality: Left;  . AV FISTULA PLACEMENT Left 02/11/2018   Procedure: INSERTION OF ARTERIOVENOUS (AV) FISTULA LEFT  ARM;  Surgeon: [name], MD;  Location: [facility];  Service: Vascular;  Laterality: Left;  . BASCILIC VEIN TRANSPOSITION Left 04/17/2018   Procedure: BASILIC VEIN TRANSPOSITION SECOND STAGE;  Surgeon: [name], MD;  Location: [facility];  Service: Vascular;  Laterality: Left;  . BIOPSY  10/20/2019   Procedure: BIOPSY;  Surgeon: Clarene Essex, MD;  Location: [facility];  Service: Endoscopy;;  . COLONOSCOPY    . DENTAL SURGERY    . ESOPHAGOGASTRODUODENOSCOPY (EGD) WITH PROPOFOL N/A 10/20/2019   Procedure: ESOPHAGOGASTRODUODENOSCOPY (EGD) WITH PROPOFOL;  Surgeon:  Clarene Essex, MD;  Location: [facility];  Service: Endoscopy;  Laterality: N/A;  . INSERTION OF DIALYSIS CATHETER Right 02/25/2018   Procedure: INSERTION OF DIALYSIS CATHETER;  Surgeon: [name], MD;  Location: [facility];  Service: Vascular;  Laterality: Right;  . PERIPHERAL VASCULAR BALLOON ANGIOPLASTY  06/03/2018   Procedure: PERIPHERAL VASCULAR BALLOON ANGIOPLASTY;  Surgeon: Marty Heck, MD;  Location: [facility];  Service: Cardiovascular;;  left a/v fistula    Family History  Problem Relation Age of Onset  . Kidney failure Mother   . Diabetes Father   . Blindness Brother     Social History   Tobacco Use  . Smoking status: Former  . Smokeless tobacco: Never  Vaping Use  . Vaping Use: Never used  Substance Use Topics  . Alcohol use: No  . Drug use: Not Currently    Types: Cocaine, Marijuana    Comment: smoked marijuana a few days ago; he denies using cocaine     Home Medications Prior to Admission medications   Medication Sig Start Date End Date Taking? Authorizing Provider  acetaminophen (TYLENOL) 650 MG CR tablet Take 1,300 mg by mouth 2 (two) times daily as needed for pain.    [provider]  amLODipine (NORVASC) 10 MG tablet Take 10 mg by mouth at bedtime.  Patient not taking: Reported on 08/28/2020 09/24/17   [provider]  azithromycin (ZITHROMAX Z-PAK) 250 MG tablet Take 1 tablet (250 mg total) by  mouth daily. Patient not taking: No sig reported 01/03/20   Charlann Lange, PA-C  benzonatate (TESSALON) 100 MG capsule Take 1 capsule (100 mg total) by mouth every 8 (eight) hours. Patient not taking: No sig reported 01/03/20   Charlann Lange, PA-C  calcitRIOL (ROCALTROL) 0.25 MCG capsule Take 1 capsule (0.25 mcg total) by mouth every Monday, Wednesday, and Friday with hemodialysis. Patient not taking: No sig reported 03/02/18   Lavina Hamman, MD  Multiple Vitamins-Minerals (MULTIVITAMIN) LIQD Take 32 oz by mouth daily.     [provider]  pantoprazole (PROTONIX) 20 MG tablet Take 1 tablet (20 mg total) by mouth daily. Patient not taking: No sig reported 09/15/19   Varney Biles, MD  pantoprazole (PROTONIX) 40 MG tablet Take 40 mg by mouth daily. Patient not taking: No sig reported 10/11/19   [provider]  promethazine (PHENERGAN) 25 MG suppository Place 1 suppository (25 mg total) rectally every 8 (eight) hours as needed for nausea or vomiting. Patient not taking: No sig reported 09/15/19   Varney Biles, MD  promethazine (PHENERGAN) 25 MG tablet Take 25 mg by mouth 3 (three) times daily. Patient not taking: No sig reported 10/11/19   [provider]  sucralfate (CARAFATE) 1 g tablet Take 1 tablet (1 g total) by mouth 4 (four) times daily -  with meals and at bedtime. Patient not taking: No sig reported 09/15/19   Varney Biles, MD  VELPHORO 500 MG chewable tablet Chew 500 mg by mouth 3 (three) times daily. Patient not taking: No sig reported 10/12/19   [provider]     Allergies    Ibuprofen and Penicillins   Review of Systems   Review of Systems A comprehensive review of systems was completed and negative except as noted in HPI.    Physical Exam BP 123/81   Pulse 73   Temp 98.3 F (36.8 C) (Oral)   Resp 18   Ht '5\' 9"'$  (1.753 m)   Wt 90.7 kg   SpO2 99%   BMI 29.53 kg/m   Physical Exam Vitals and nursing note reviewed.  Constitutional:      Appearance: Normal appearance.  HENT:     Head: Normocephalic and atraumatic.     Nose: Nose normal.     Mouth/Throat:     Mouth: Mucous membranes are moist.  Eyes:     Extraocular Movements: Extraocular movements intact.     Conjunctiva/sclera: Conjunctivae normal.  Cardiovascular:     Rate and Rhythm: Normal rate.  Pulmonary:     Effort: Pulmonary effort is normal.     Breath sounds: Normal breath sounds.  Abdominal:     General: Abdomen is flat.     Palpations: Abdomen is soft.     Tenderness:  There is no abdominal tenderness.  Musculoskeletal:        General: No swelling. Normal range of motion.     Cervical back: Neck supple.     Comments: Dialysis fistula in LUE with palpable thrill  Skin:    General: Skin is warm and dry.  Neurological:     General: No focal deficit present.     Mental Status: He is alert.  Psychiatric:        Mood and Affect: Mood normal.     ED Results / Procedures / Treatments   Labs (all labs ordered are listed, but only abnormal results are displayed) Labs Reviewed  COMPREHENSIVE METABOLIC PANEL - Abnormal; Notable for the following components:  Result Value   Sodium 132 (*)    Potassium 7.2 (*)    Chloride 90 (*)    BUN 77 (*)    Creatinine, Ser 13.24 (*)    Total Protein 8.5 (*)    Alkaline Phosphatase 190 (*)    Total Bilirubin 1.5 (*)    GFR, Estimated 4 (*)    Anion gap 19 (*)    All other components within normal limits  CBC WITH DIFFERENTIAL/PLATELET - Abnormal; Notable for the following components:   RBC 3.98 (*)    Hemoglobin 12.8 (*)    HCT 38.2 (*)    Platelets 132 (*)    All other components within normal limits  CBG MONITORING, ED - Abnormal; Notable for the following components:   Glucose-Capillary 270 (*)    All other components within normal limits  RESP PANEL BY RT-PCR (FLU A&B, COVID) [id number]    EKG EKG Interpretation  Date/Time:  Wednesday December 06 2020 16:00:26 EDT Ventricular Rate:  62 PR Interval:  196 QRS Duration: 108 QT Interval:  438 QTC Calculation: 444 R Axis:   -69 Text Interpretation: Sinus rhythm with Premature atrial complexes Left axis deviation Abnormal ECG No significant change since last tracing Confirmed by Calvert Cantor [phone]) on 12/06/2020 6:43:10 PM  Radiology No results found.  Procedures .Critical Care Performed by: Truddie Hidden, MD Authorized by: Truddie Hidden, MD   Critical care provider statement:    Critical care time (minutes):  30   Critical care  was necessary to treat or prevent imminent or life-threatening deterioration of the following conditions:  Renal failure and metabolic crisis   Critical care was time spent personally by me on the following activities:  Discussions with consultants, evaluation of patient's response to treatment, examination of patient, ordering and performing treatments and interventions, ordering and review of laboratory studies, ordering and review of radiographic studies, pulse oximetry, re-evaluation of patient's condition, obtaining history from patient or surrogate and review of old charts   Care discussed with: admitting provider    Medications Ordered in the ED Medications  sodium zirconium cyclosilicate (LOKELMA) packet 10 g (10 g Oral Given 12/06/20 1855)  calcium gluconate 1 g/ 50 mL sodium chloride IVPB (0 mg Intravenous Stopped 12/06/20 2157)  dextrose 50 % solution 25 g (25 g Intravenous Given 12/06/20 2040)  insulin aspart (novoLOG) injection 10 Units (10 Units Intravenous Given 12/06/20 2047)     MDM Rules/Calculators/A&P MDM Labs done in triage reviewed, confirms hyperkalemia, no significant EKG changes. Will give a dose of Lokelma, discuss with Nephrology.   ED Course  I have reviewed the triage vital signs and the nursing notes.  Pertinent labs & imaging results that were available during my care of the patient were reviewed by me and considered in my medical decision making (see chart for details).  Clinical Course as of 12/06/20 2330  Wed Dec 06, 2020  1946 Spoke with Dr. Joelyn Oms, Nephrology who will evaluate the patient for dialysis. He recommends CaGlu, Insulin and D50 in addition to [facility] for HyperK given anticipated delay getting him dialysis.  [CS]  2329 Care of the patient signed out at the change of shift.  [CS]    Clinical Course User Index [CS] Truddie Hidden, MD    Final Clinical Impression(s) / ED Diagnoses Final diagnoses:  ESRD on hemodialysis ([place])  Hyperkalemia     Rx / DC Orders ED Discharge Orders     None  Truddie Hidden, MD 12/06/20 2212

## 2020-12-07 LAB — HEPATITIS B SURFACE ANTIBODY,QUALITATIVE: Hep B S Ab: REACTIVE — AB

## 2020-12-07 LAB — I-STAT CHEM 8, ED
BUN: 88 mg/dL — ABNORMAL HIGH (ref 8–23)
Calcium, Ion: 1.08 mmol/L — ABNORMAL LOW (ref 1.15–1.40)
Chloride: 98 mmol/L (ref 98–111)
Creatinine, Ser: 14.2 mg/dL — ABNORMAL HIGH (ref 0.61–1.24)
Glucose, Bld: 59 mg/dL — ABNORMAL LOW (ref 70–99)
HCT: 38 % — ABNORMAL LOW (ref 39.0–52.0)
Hemoglobin: 12.9 g/dL — ABNORMAL LOW (ref 13.0–17.0)
Potassium: 5 mmol/L (ref 3.5–5.1)
Sodium: 135 mmol/L (ref 135–145)
TCO2: 26 mmol/L (ref 22–32)

## 2020-12-07 LAB — CBG MONITORING, ED: Glucose-Capillary: 122 mg/dL — ABNORMAL HIGH (ref 70–99)

## 2020-12-07 LAB — HEPATITIS B SURFACE ANTIGEN: Hepatitis B Surface Ag: NONREACTIVE

## 2020-12-07 LAB — GLUCOSE, CAPILLARY: Glucose-Capillary: 99 mg/dL (ref 70–99)

## 2020-12-07 MED ORDER — LIDOCAINE-PRILOCAINE 2.5-2.5 % EX CREA
1.0000 "application " | TOPICAL_CREAM | CUTANEOUS | Status: DC | PRN
  Filled 2020-12-07: qty 5

## 2020-12-07 MED ORDER — SODIUM CHLORIDE 0.9 % IV SOLN: 100.0000 mL | INTRAVENOUS | Status: DC | PRN

## 2020-12-07 NOTE — ED Provider Notes (Signed)
Pt returns to ED post dialysis for discharge.   Pt denies any c/o other than not having eaten today/hungry.  Po fluids and food provided.   Pt currently appears stable for d/c.      Lajean Saver, MD 12/07/20 1341

## 2020-12-07 NOTE — ED Notes (Addendum)
Provided pt w/turkey bag. Pt refused the cup of ice water

## 2020-12-07 NOTE — Discharge Instructions (Signed)
It was our pleasure to provide your ER care today - we hope that you feel better.  Make sure to go to your regular dialysis, and make sure not to skip or miss dialysis sessions.   Follow up with your doctor for recheck of blood pressure as it is high today.  Return to ER if worse, new symptoms, fevers, trouble breathing, or other emergency concern.

## 2020-12-08 LAB — HEPATITIS B SURFACE ANTIBODY, QUANTITATIVE: Hep B S AB Quant (Post): 932 m[IU]/mL (ref >9.9)

## 2020-12-09 DIAGNOSIS — Z992 Dependence on renal dialysis: Secondary | ICD-10-CM | POA: Diagnosis not present

## 2020-12-09 DIAGNOSIS — E1129 Type 2 diabetes mellitus with other diabetic kidney complication: Secondary | ICD-10-CM | POA: Diagnosis not present

## 2020-12-09 DIAGNOSIS — N2581 Secondary hyperparathyroidism of renal origin: Secondary | ICD-10-CM | POA: Diagnosis not present

## 2020-12-09 DIAGNOSIS — N186 End stage renal disease: Secondary | ICD-10-CM | POA: Diagnosis not present

## 2020-12-09 DIAGNOSIS — R52 Pain, unspecified: Secondary | ICD-10-CM | POA: Diagnosis not present

## 2020-12-09 DIAGNOSIS — D689 Coagulation defect, unspecified: Secondary | ICD-10-CM | POA: Diagnosis not present

## 2020-12-12 DIAGNOSIS — R52 Pain, unspecified: Secondary | ICD-10-CM | POA: Diagnosis not present

## 2020-12-12 DIAGNOSIS — N186 End stage renal disease: Secondary | ICD-10-CM | POA: Diagnosis not present

## 2020-12-12 DIAGNOSIS — Z992 Dependence on renal dialysis: Secondary | ICD-10-CM | POA: Diagnosis not present

## 2020-12-12 DIAGNOSIS — E1129 Type 2 diabetes mellitus with other diabetic kidney complication: Secondary | ICD-10-CM | POA: Diagnosis not present

## 2020-12-12 DIAGNOSIS — N2581 Secondary hyperparathyroidism of renal origin: Secondary | ICD-10-CM | POA: Diagnosis not present

## 2020-12-12 DIAGNOSIS — D689 Coagulation defect, unspecified: Secondary | ICD-10-CM | POA: Diagnosis not present

## 2020-12-14 DIAGNOSIS — R52 Pain, unspecified: Secondary | ICD-10-CM | POA: Diagnosis not present

## 2020-12-14 DIAGNOSIS — Z992 Dependence on renal dialysis: Secondary | ICD-10-CM | POA: Diagnosis not present

## 2020-12-14 DIAGNOSIS — N2581 Secondary hyperparathyroidism of renal origin: Secondary | ICD-10-CM | POA: Diagnosis not present

## 2020-12-14 DIAGNOSIS — E1129 Type 2 diabetes mellitus with other diabetic kidney complication: Secondary | ICD-10-CM | POA: Diagnosis not present

## 2020-12-14 DIAGNOSIS — N186 End stage renal disease: Secondary | ICD-10-CM | POA: Diagnosis not present

## 2020-12-14 DIAGNOSIS — D689 Coagulation defect, unspecified: Secondary | ICD-10-CM | POA: Diagnosis not present

## 2020-12-16 DIAGNOSIS — R52 Pain, unspecified: Secondary | ICD-10-CM | POA: Diagnosis not present

## 2020-12-16 DIAGNOSIS — D689 Coagulation defect, unspecified: Secondary | ICD-10-CM | POA: Diagnosis not present

## 2020-12-16 DIAGNOSIS — Z992 Dependence on renal dialysis: Secondary | ICD-10-CM | POA: Diagnosis not present

## 2020-12-16 DIAGNOSIS — N186 End stage renal disease: Secondary | ICD-10-CM | POA: Diagnosis not present

## 2020-12-16 DIAGNOSIS — N2581 Secondary hyperparathyroidism of renal origin: Secondary | ICD-10-CM | POA: Diagnosis not present

## 2020-12-16 DIAGNOSIS — E1129 Type 2 diabetes mellitus with other diabetic kidney complication: Secondary | ICD-10-CM | POA: Diagnosis not present

## 2020-12-17 DIAGNOSIS — N186 End stage renal disease: Secondary | ICD-10-CM | POA: Diagnosis not present

## 2020-12-19 DIAGNOSIS — R52 Pain, unspecified: Secondary | ICD-10-CM | POA: Diagnosis not present

## 2020-12-19 DIAGNOSIS — D689 Coagulation defect, unspecified: Secondary | ICD-10-CM | POA: Diagnosis not present

## 2020-12-19 DIAGNOSIS — E1129 Type 2 diabetes mellitus with other diabetic kidney complication: Secondary | ICD-10-CM | POA: Diagnosis not present

## 2020-12-19 DIAGNOSIS — Z992 Dependence on renal dialysis: Secondary | ICD-10-CM | POA: Diagnosis not present

## 2020-12-19 DIAGNOSIS — N186 End stage renal disease: Secondary | ICD-10-CM | POA: Diagnosis not present

## 2020-12-19 DIAGNOSIS — N2581 Secondary hyperparathyroidism of renal origin: Secondary | ICD-10-CM | POA: Diagnosis not present

## 2020-12-20 NOTE — Progress Notes (Signed)
[facility] Note  12/22/2020     CHIEF COMPLAINT Patient presents for Retina Follow Up   HISTORY OF PRESENT ILLNESS: Jonathan Mcneil is a 65 y.o. male who presents to the clinic today for:   HPI     Retina Follow Up   Patient presents with  Diabetic Retinopathy.  In both eyes.  This started 3 weeks ago.  I, the attending physician,  performed the HPI with the patient and updated documentation appropriately.        Comments   Patient here for 3 weeks retina follow up for PDR OU. Patient states vision is blurry OS. Like a film. No eye pain.       Last edited by Bernarda Caffey, MD on 12/22/2020  9:37 PM.     Patient states flashing OU--slightly worse since last visit  Referring physician: Merrilee Seashore, MD [address],  [place] [address]  HISTORICAL INFORMATION:   Selected notes from the MEDICAL RECORD NUMBER Referred by Dr. Maryjane Hurter for heme OD   CURRENT MEDICATIONS: No current outpatient medications on file. (Ophthalmic Drugs)   No current facility-administered medications for this visit. (Ophthalmic Drugs)   Current Outpatient Medications (Other)  Medication Sig   acetaminophen (TYLENOL) 650 MG CR tablet Take 1,300 mg by mouth 2 (two) times daily as needed for pain.   azithromycin (ZITHROMAX Z-PAK) 250 MG tablet Take 1 tablet (250 mg total) by mouth daily. (Patient not taking: No sig reported)   B Complex-C-Folic Acid (DIALYVITE PO) Take 1 tablet by mouth daily.   benzonatate (TESSALON) 100 MG capsule Take 1 capsule (100 mg total) by mouth every 8 (eight) hours. (Patient not taking: No sig reported)   calcitRIOL (ROCALTROL) 0.25 MCG capsule Take 1 capsule (0.25 mcg total) by mouth every Monday, Wednesday, and Friday with hemodialysis. (Patient not taking: No sig reported)   gabapentin (NEURONTIN) 300 MG capsule Take 300 mg by mouth at bedtime.   pantoprazole (PROTONIX) 20 MG tablet Take 1 tablet (20 mg total)  by mouth daily. (Patient not taking: No sig reported)   promethazine (PHENERGAN) 25 MG suppository Place 1 suppository (25 mg total) rectally every 8 (eight) hours as needed for nausea or vomiting. (Patient not taking: No sig reported)   sertraline (ZOLOFT) 25 MG tablet Take 25 mg by mouth every Monday, Wednesday, and Friday.   sucralfate (CARAFATE) 1 g tablet Take 1 tablet (1 g total) by mouth 4 (four) times daily -  with meals and at bedtime. (Patient not taking: No sig reported)   sucroferric oxyhydroxide (VELPHORO) 500 MG chewable tablet Chew 1,000 mg by mouth 3 (three) times daily with meals.   VELPHORO 500 MG chewable tablet Chew 500 mg by mouth 3 (three) times daily. (Patient not taking: No sig reported)   No current facility-administered medications for this visit. (Other)   REVIEW OF SYSTEMS: ROS   Positive for: Neurological, Genitourinary, Endocrine, Eyes, Psychiatric Negative for: Constitutional, Gastrointestinal, Skin, Musculoskeletal, HENT, Cardiovascular, Respiratory, Allergic/Imm, Heme/Lymph Last edited by Theodore Demark, COA on 12/22/2020  9:07 AM.     ALLERGIES Allergies  Allergen Reactions   Ibuprofen Other (See Comments)    Unknown reaction   Penicillins Itching and Other (See Comments)    Has patient had a PCN reaction causing immediate rash, facial/tongue/throat swelling, SOB or lightheadedness with hypotension: No Has patient had a PCN reaction causing severe rash involving mucus membranes or skin necrosis: No Has patient had  a PCN reaction that required hospitalization No Has patient had a PCN reaction occurring within the last 10 years: Unknown If all of the above answers are "NO", then may proceed with Cephalosporin use.   Venlafaxine     Other reaction(s): insomnia   PAST MEDICAL HISTORY Past Medical History:  Diagnosis Date   Diabetes mellitus without complication ([place])    ESRD (end stage renal disease) ([place])    GERD (gastroesophageal reflux disease)     PMH   Hypertension    Low back pain    Metabolic acidosis    Neuromuscular disorder ([place])    peripheral neuropathy   Pancreatitis    Schizophrenia ([place])    does not take medications   Past Surgical History:  Procedure Laterality Date   A/V FISTULAGRAM Left 06/03/2018   Procedure: A/V FISTULAGRAM;  Surgeon: Marty Heck, MD;  Location: [facility];  Service: Cardiovascular;  Laterality: Left;   AV FISTULA PLACEMENT Left 02/11/2018   Procedure: INSERTION OF ARTERIOVENOUS (AV) FISTULA LEFT  ARM;  Surgeon: [name], MD;  Location: [facility];  Service: Vascular;  Laterality: Left;   [place] Left 04/17/2018   Procedure: BASILIC VEIN TRANSPOSITION SECOND STAGE;  Surgeon: [name], MD;  Location: [facility];  Service: Vascular;  Laterality: Left;   BIOPSY  10/20/2019   Procedure: BIOPSY;  Surgeon: Clarene Essex, MD;  Location: [facility];  Service: Endoscopy;;   COLONOSCOPY     DENTAL SURGERY     ESOPHAGOGASTRODUODENOSCOPY (EGD) WITH PROPOFOL N/A 10/20/2019   Procedure: ESOPHAGOGASTRODUODENOSCOPY (EGD) WITH PROPOFOL;  Surgeon: Clarene Essex, MD;  Location: [facility];  Service: Endoscopy;  Laterality: N/A;   INSERTION OF DIALYSIS CATHETER Right 02/25/2018   Procedure: INSERTION OF DIALYSIS CATHETER;  Surgeon: [name], MD;  Location: [facility];  Service: Vascular;  Laterality: Right;   PERIPHERAL VASCULAR BALLOON ANGIOPLASTY  06/03/2018   Procedure: PERIPHERAL VASCULAR BALLOON ANGIOPLASTY;  Surgeon: Marty Heck, MD;  Location: [facility];  Service: Cardiovascular;;  left a/v fistula   FAMILY HISTORY Family History  Problem Relation Age of Onset   Kidney failure Mother    Diabetes Father    Blindness Brother    SOCIAL HISTORY Social History   Tobacco Use   Smoking status: Former   Smokeless tobacco: Never  Scientific laboratory technician Use: Never used  Substance Use Topics   Alcohol use: No   Drug use:  Not Currently    Types: Cocaine, Marijuana    Comment: smoked marijuana a few days ago; he denies using cocaine       OPHTHALMIC EXAM:  Base Eye Exam     Visual Acuity (Snellen - Linear)       Right Left   Dist [place] 20/25 20/150 -1   Dist ph [place]  20/80         Tonometry (Tonopen, 9:05 AM)       Right Left   Pressure 21 22         Pupils       Dark Light Shape React APD   Right 2 1 Round Brisk None   Left 2 1 Round Brisk None         Visual Fields (Counting fingers)       Left Right    Full Full         Extraocular Movement       Right Left    Full, Ortho Full, Ortho  Neuro/Psych     Oriented x3: Yes   Mood/Affect: Normal         Dilation     Both eyes: 1.0% Mydriacyl, 2.5% Phenylephrine @ 9:05 AM           Slit Lamp and Fundus Exam     Slit Lamp Exam       Right Left   Lids/Lashes Dermatochalasis - upper lid Dermatochalasis - upper lid   Conjunctiva/Sclera Melanosis Melanosis, temporal pinguecula   Cornea 1+Punctate epithelial erosions, mild arcus, mild tear film debris 1+Punctate epithelial erosions, mild arcus   Anterior Chamber Deep and quiet Deep and quiet   Iris Round and dilated, No NVI Round and dilated, No NVI   Lens 2-3+ Nuclear sclerosis, 2-3+ Cortical cataract 2-3+ Nuclear sclerosis, 2-3+ Cortical cataract   Vitreous Vitreous syneresis, old white VH settled inferiorly Vitreous syneresis, blood stained vitreous condensations--slightly worse centrally, blood clots settling inferiorly         Fundus Exam       Right Left   Disc Mild Pallor, Sharp rim, focal PPP/PPA hazy view, Mild Pallor, Sharp rim   C/D Ratio 0.4 0.6   Macula Flat, Good foveal reflex, RPE mottling and clumping, no edema, trace MA hazy view, Flat, Blunted foveal reflex, RPE mottling and clumping, scattered Microaneurysms/DBH greatest temporal macula    Vessels attenuated, mild Tortuousity, mild Copper wiring, nasal NV improved/regressing mild  attenuation, mild Copper wiring, IN NV    Periphery Attached, 360 IRH/DBH - improved, 360 PRP with room for fill in Hazy view, Attached, scattered IRH/DBH greatest nasally, good 360 PRP changes, No RT/RD           Refraction     Manifest Refraction       Sphere Cylinder Dist VA   Right      Left -0.50 Sphere 20/70           IMAGING AND PROCEDURES  Imaging and Procedures for 12/22/2020  OCT, Retina - OU - Both Eyes       Right Eye Quality was good. Central Foveal Thickness: 253. Progression has been stable. Findings include no IRF, no SRF, normal foveal contour, intraretinal hyper-reflective material (Mild, persistent vitreous opacities, irregular lamination, mild atrophy).   Left Eye Quality was good. Central Foveal Thickness: 254. Progression has worsened. Findings include normal foveal contour, no SRF, no IRF (Non-central IRF temporal macula caught on widefield--slightly improved, interval release of partial PVD, interval increased vitreous opacities).   Notes *Images captured and stored on drive  Diagnosis / Impression:  OD: NFP; no IRF/SRF; mild vitreous opacities; irregular lamination, mild atrophy OS: interval increase in vitreous opacities; NFP, no SRF; Non-central IRF temporal macula caught on widefield--slightly improved; interval release of partial PVD,   Clinical management:  See below  Abbreviations: NFP - Normal foveal profile. CME - cystoid macular edema. PED - pigment epithelial detachment. IRF - intraretinal fluid. SRF - subretinal fluid. EZ - ellipsoid zone. ERM - epiretinal membrane. ORA - outer retinal atrophy. ORT - outer retinal tubulation. SRHM - subretinal hyper-reflective material. IRHM - intraretinal hyper-reflective material            ASSESSMENT/PLAN:    ICD-10-CM   1. Posterior vitreous detachment of left eye  H43.812     2. Vitreous hemorrhage of left eye (HCC)  H43.12     3. Proliferative diabetic retinopathy of both eyes with  macular edema associated with type 2 diabetes mellitus ([place])  [id number]     4. Retinal  edema  H35.81 OCT, Retina - OU - Both Eyes    5. Vitreous hemorrhage, right eye ([place])  H43.11     6. Essential hypertension  I10     7. Hypertensive retinopathy of both eyes  H35.033     8. Combined forms of age-related cataract of both eyes  H25.813       1,2. Hemorrhagic PVD OS  - Onset mid-August 2022 w/ new onset floaters, no photopsias  - s/p IVA OS #4 (09.09.22) - today, VA decreased to 20/80 from - exam shows interval increase in vitreous heme -- more dense centrally - pt reports receiving heparin during dialysis (T, Th, Sat) -- may have contributed to interval worsening  - Discussed findings and prognosis  - No RT or RD on 360 peripheral exam  - Reviewed s/s of RT/RD  - Strict return precautions for any such RT/RD signs/symptoms  - VH precautions reviewed -- minimize activities, keep head elevated, hold heparin if able, avoid ASA/NSAIDS  - F/U 1 week   3,4. Proliferative diabetic retinopathy OU (OD > OS) - s/p IVA OD #1 (12.08.21), #2 (01.11.22), #3 (02.09.22) - s/p IVA OS #1 (12.10.21), #2 (01.11.22), #3 (02.09.22), #4 (09.09.22) - FA (12.08.21) shows late-leaking MA, vascular nonperfusion, +NV OU (nasal midzone) - s/p PRP OD (01.26.22) - s/p PRP OS (03.09.22) - BCVA 20/25 OD, 20/70 OS (down from 20/20) - exam shows preretinal/subhyaloid heme and VH OD improved w/ white heme settled inferiorly, OS with blood stained vit condensations getting worse from hemorrhagic PVD (see above), and blood clots settling inferiorly  - OCT OD: mild vitreous opacities; irregular lamination, mild atrophy; OS: Non-central IRF temporal macula caught on widefield--slightly improved, vitreous opacities have increased  - repeat FA 6.6.22 shows interval improvement in NVE/leakage OU s/p PRP OU-- just minimal leakage remains  - recommend holding heparin at dialysis if able--gave note for patient to take to  dialysis on 12/23/2020 - f/u 12/29/2020 for possible avastin, DFE, OCT  5. Vitreous Hemorrhage OD -- improving  - acute onset with violent coughing spell  - underlying etiology related to NV/PDR as described above, but contributed by use of heparin for dialysis  - VH clearing and settling inferiorly -- white blood clots in inferior periphery  - s/p PRP OD (01.26.22)  - VH precautions reviewed -- minimize activities, keep head elevated, avoid ASA/NSAIDs/blood thinners as able  - monitor   6,7. Hypertensive retinopathy OU - discussed importance of tight BP control - monitor   8. Mixed Cataract OU - The symptoms of cataract, surgical options, and treatments and risks were discussed with patient - discussed diagnosis and progression - not yet visually significant - monitor for now  Ophthalmic Meds Ordered this visit:  No orders of the defined types were placed in this encounter.     Return in 1 week (on 12/29/2020) for DFE, OCT, possible avastin.  There are no Patient Instructions on file for this visit.  This document serves as a record of services personally performed by Gardiner Sleeper, MD, PhD. It was created on their behalf by Leonie Douglas, an ophthalmic technician. The creation of this record is the provider's dictation and/or activities during the visit.    Electronically signed by: Leonie Douglas COA, 12/22/20  10:02 PM  This document serves as a record of services personally performed by Gardiner Sleeper, MD, PhD. It was created on their behalf by Roselee Nova, COMT. The creation of this record is the provider's dictation and/or activities during  the visit.  Electronically signed by: Roselee Nova, COMT 12/22/20 10:02 PM  Gardiner Sleeper, M.D., Ph.D. Diseases & Surgery of the Retina and [place] 12/22/2020  I have reviewed the above documentation for accuracy and completeness, and I agree with the above. Gardiner Sleeper, M.D., Ph.D.  12/22/20 10:02 PM  Abbreviations: M myopia (nearsighted); A astigmatism; H hyperopia (farsighted); P presbyopia; Mrx spectacle prescription;  CTL contact lenses; OD right eye; OS left eye; OU both eyes  XT exotropia; ET esotropia; PEK punctate epithelial keratitis; PEE punctate epithelial erosions; DES dry eye syndrome; MGD meibomian gland dysfunction; ATs artificial tears; PFAT's preservative free artificial tears; [place] nuclear sclerotic cataract; PSC posterior subcapsular cataract; ERM epi-retinal membrane; PVD posterior vitreous detachment; RD retinal detachment; DM diabetes mellitus; DR diabetic retinopathy; NPDR non-proliferative diabetic retinopathy; PDR proliferative diabetic retinopathy; CSME clinically significant macular edema; DME diabetic macular edema; dbh dot blot hemorrhages; CWS cotton wool spot; POAG primary open angle glaucoma; C/D cup-to-disc ratio; HVF humphrey visual field; GVF goldmann visual field; OCT optical coherence tomography; IOP intraocular pressure; BRVO Branch retinal vein occlusion; CRVO central retinal vein occlusion; CRAO central retinal artery occlusion; BRAO branch retinal artery occlusion; RT retinal tear; SB scleral buckle; PPV pars plana vitrectomy; VH Vitreous hemorrhage; PRP panretinal laser photocoagulation; IVK intravitreal kenalog; VMT vitreomacular traction; MH Macular hole;  NVD neovascularization of the disc; NVE neovascularization elsewhere; AREDS age related eye disease study; ARMD age related macular degeneration; POAG primary open angle glaucoma; EBMD epithelial/anterior basement membrane dystrophy; ACIOL anterior chamber intraocular lens; IOL intraocular lens; PCIOL posterior chamber intraocular lens; Phaco/IOL phacoemulsification with intraocular lens placement; [place] photorefractive keratectomy; LASIK laser assisted in situ keratomileusis; HTN hypertension; DM diabetes mellitus; COPD chronic obstructive pulmonary disease

## 2020-12-21 DIAGNOSIS — D689 Coagulation defect, unspecified: Secondary | ICD-10-CM | POA: Diagnosis not present

## 2020-12-21 DIAGNOSIS — R52 Pain, unspecified: Secondary | ICD-10-CM | POA: Diagnosis not present

## 2020-12-21 DIAGNOSIS — N2581 Secondary hyperparathyroidism of renal origin: Secondary | ICD-10-CM | POA: Diagnosis not present

## 2020-12-21 DIAGNOSIS — N186 End stage renal disease: Secondary | ICD-10-CM | POA: Diagnosis not present

## 2020-12-21 DIAGNOSIS — E1129 Type 2 diabetes mellitus with other diabetic kidney complication: Secondary | ICD-10-CM | POA: Diagnosis not present

## 2020-12-21 DIAGNOSIS — Z992 Dependence on renal dialysis: Secondary | ICD-10-CM | POA: Diagnosis not present

## 2020-12-22 ENCOUNTER — Other Ambulatory Visit: Payer: Self-pay

## 2020-12-22 ENCOUNTER — Encounter (INDEPENDENT_AMBULATORY_CARE_PROVIDER_SITE_OTHER): Payer: Self-pay | Admitting: Ophthalmology

## 2020-12-22 ENCOUNTER — Ambulatory Visit (INDEPENDENT_AMBULATORY_CARE_PROVIDER_SITE_OTHER): Payer: Medicare Other | Admitting: Ophthalmology

## 2020-12-22 DIAGNOSIS — E1122 Type 2 diabetes mellitus with diabetic chronic kidney disease: Secondary | ICD-10-CM | POA: Diagnosis not present

## 2020-12-22 DIAGNOSIS — H4311 Vitreous hemorrhage, right eye: Secondary | ICD-10-CM

## 2020-12-22 DIAGNOSIS — H3581 Retinal edema: Secondary | ICD-10-CM

## 2020-12-22 DIAGNOSIS — I120 Hypertensive chronic kidney disease with stage 5 chronic kidney disease or end stage renal disease: Secondary | ICD-10-CM | POA: Diagnosis not present

## 2020-12-22 DIAGNOSIS — I10 Essential (primary) hypertension: Secondary | ICD-10-CM

## 2020-12-22 DIAGNOSIS — H43812 Vitreous degeneration, left eye: Secondary | ICD-10-CM | POA: Diagnosis not present

## 2020-12-22 DIAGNOSIS — Z992 Dependence on renal dialysis: Secondary | ICD-10-CM | POA: Diagnosis not present

## 2020-12-22 DIAGNOSIS — E1022 Type 1 diabetes mellitus with diabetic chronic kidney disease: Secondary | ICD-10-CM | POA: Diagnosis not present

## 2020-12-22 DIAGNOSIS — E782 Mixed hyperlipidemia: Secondary | ICD-10-CM | POA: Diagnosis not present

## 2020-12-22 DIAGNOSIS — E113513 Type 2 diabetes mellitus with proliferative diabetic retinopathy with macular edema, bilateral: Secondary | ICD-10-CM

## 2020-12-22 DIAGNOSIS — N185 Chronic kidney disease, stage 5: Secondary | ICD-10-CM | POA: Diagnosis not present

## 2020-12-22 DIAGNOSIS — H35033 Hypertensive retinopathy, bilateral: Secondary | ICD-10-CM

## 2020-12-22 DIAGNOSIS — H4313 Vitreous hemorrhage, bilateral: Secondary | ICD-10-CM | POA: Diagnosis not present

## 2020-12-22 DIAGNOSIS — H4312 Vitreous hemorrhage, left eye: Secondary | ICD-10-CM

## 2020-12-22 DIAGNOSIS — H25813 Combined forms of age-related cataract, bilateral: Secondary | ICD-10-CM | POA: Diagnosis not present

## 2020-12-22 DIAGNOSIS — N186 End stage renal disease: Secondary | ICD-10-CM | POA: Diagnosis not present

## 2020-12-23 DIAGNOSIS — Z992 Dependence on renal dialysis: Secondary | ICD-10-CM | POA: Diagnosis not present

## 2020-12-23 DIAGNOSIS — R52 Pain, unspecified: Secondary | ICD-10-CM | POA: Diagnosis not present

## 2020-12-23 DIAGNOSIS — N2581 Secondary hyperparathyroidism of renal origin: Secondary | ICD-10-CM | POA: Diagnosis not present

## 2020-12-23 DIAGNOSIS — E1129 Type 2 diabetes mellitus with other diabetic kidney complication: Secondary | ICD-10-CM | POA: Diagnosis not present

## 2020-12-23 DIAGNOSIS — N186 End stage renal disease: Secondary | ICD-10-CM | POA: Diagnosis not present

## 2020-12-23 DIAGNOSIS — L299 Pruritus, unspecified: Secondary | ICD-10-CM | POA: Diagnosis not present

## 2020-12-26 DIAGNOSIS — N2581 Secondary hyperparathyroidism of renal origin: Secondary | ICD-10-CM | POA: Diagnosis not present

## 2020-12-26 DIAGNOSIS — L299 Pruritus, unspecified: Secondary | ICD-10-CM | POA: Diagnosis not present

## 2020-12-26 DIAGNOSIS — N186 End stage renal disease: Secondary | ICD-10-CM | POA: Diagnosis not present

## 2020-12-26 DIAGNOSIS — Z992 Dependence on renal dialysis: Secondary | ICD-10-CM | POA: Diagnosis not present

## 2020-12-26 DIAGNOSIS — R52 Pain, unspecified: Secondary | ICD-10-CM | POA: Diagnosis not present

## 2020-12-26 DIAGNOSIS — E1129 Type 2 diabetes mellitus with other diabetic kidney complication: Secondary | ICD-10-CM | POA: Diagnosis not present

## 2020-12-27 NOTE — Progress Notes (Signed)
[facility] Note  12/29/2020     CHIEF COMPLAINT Patient presents for Retina Follow Up   HISTORY OF PRESENT ILLNESS: Alen Suite [place] is a 65 y.o. male who presents to the clinic today for:   HPI     Retina Follow Up   Patient presents with  Other.  In left eye.  This started weeks ago.  Severity is moderate.  Duration of 1 week.  Since onset it is gradually improving.  I, the attending physician,  performed the HPI with the patient and updated documentation appropriately.        Comments   65 y/o male pt here for 1 wk f/u for hemorrhagic PVD OS.  VA OS seems improved, but it still appears as if there is a "film" over his left eye.  No change in [place] OD.  Denies pain, FOL, floaters.  No gtts.  BS 113 at dialysis yesterday.  A1C 5.7.      Last edited by Bernarda Caffey, MD on 12/29/2020 12:56 PM.    Patient states vision is improving, pt is still not using heparin at dialysis  Referring physician: Merrilee Seashore, MD [address],  [place] [address]  HISTORICAL INFORMATION:   Selected notes from the [facility] Referred by Dr. Maryjane Hurter for heme OD   CURRENT MEDICATIONS: No current outpatient medications on file. (Ophthalmic Drugs)   No current facility-administered medications for this visit. (Ophthalmic Drugs)   Current Outpatient Medications (Other)  Medication Sig   acetaminophen (TYLENOL) 650 MG CR tablet Take 1,300 mg by mouth 2 (two) times daily as needed for pain.   azithromycin (ZITHROMAX Z-PAK) 250 MG tablet Take 1 tablet (250 mg total) by mouth daily.   B Complex-C-Folic Acid (DIALYVITE PO) Take 1 tablet by mouth daily.   benzonatate (TESSALON) 100 MG capsule Take 1 capsule (100 mg total) by mouth every 8 (eight) hours.   calcitRIOL (ROCALTROL) 0.25 MCG capsule Take 1 capsule (0.25 mcg total) by mouth every Monday, Wednesday, and Friday with hemodialysis.   gabapentin (NEURONTIN) 300 MG capsule Take  300 mg by mouth at bedtime.   ondansetron (ZOFRAN) 8 MG tablet Take 8 mg by mouth every 8 (eight) hours as needed.   pantoprazole (PROTONIX) 20 MG tablet Take 1 tablet (20 mg total) by mouth daily.   promethazine (PHENERGAN) 25 MG suppository Place 1 suppository (25 mg total) rectally every 8 (eight) hours as needed for nausea or vomiting.   sertraline (ZOLOFT) 25 MG tablet Take 25 mg by mouth every Monday, Wednesday, and Friday.   sucralfate (CARAFATE) 1 g tablet Take 1 tablet (1 g total) by mouth 4 (four) times daily -  with meals and at bedtime.   sucroferric oxyhydroxide (VELPHORO) 500 MG chewable tablet Chew 1,000 mg by mouth 3 (three) times daily with meals.   VELPHORO 500 MG chewable tablet Chew 500 mg by mouth 3 (three) times daily.   No current facility-administered medications for this visit. (Other)   REVIEW OF SYSTEMS: ROS   Positive for: Genitourinary, Endocrine, Eyes, Psychiatric Negative for: Constitutional, Gastrointestinal, Neurological, Skin, Musculoskeletal, HENT, Cardiovascular, Respiratory, Allergic/Imm, Heme/Lymph Last edited by Matthew Folks, COA on 12/29/2020  9:06 AM.      ALLERGIES Allergies  Allergen Reactions   Ibuprofen Other (See Comments)    Unknown reaction   Penicillins Itching and Other (See Comments)    Has patient had a PCN reaction causing immediate rash, facial/tongue/throat swelling,  SOB or lightheadedness with hypotension: No Has patient had a PCN reaction causing severe rash involving mucus membranes or skin necrosis: No Has patient had a PCN reaction that required hospitalization No Has patient had a PCN reaction occurring within the last 10 years: Unknown If all of the above answers are "NO", then may proceed with Cephalosporin use.   Venlafaxine     Other reaction(s): insomnia   PAST MEDICAL HISTORY Past Medical History:  Diagnosis Date   Cataract    Diabetes mellitus without complication ([place])    Diabetic retinopathy ([place])    ESRD  (end stage renal disease) ([place])    GERD (gastroesophageal reflux disease)    PMH   Hypertension    Hypertensive retinopathy    Low back pain    Metabolic acidosis    Neuromuscular disorder ([place])    peripheral neuropathy   Pancreatitis    Schizophrenia ([place])    does not take medications   Past Surgical History:  Procedure Laterality Date   A/V FISTULAGRAM Left 06/03/2018   Procedure: A/V FISTULAGRAM;  Surgeon: Marty Heck, MD;  Location: [facility];  Service: Cardiovascular;  Laterality: Left;   AV FISTULA PLACEMENT Left 02/11/2018   Procedure: INSERTION OF ARTERIOVENOUS (AV) FISTULA LEFT  ARM;  Surgeon: [name], MD;  Location: [facility];  Service: Vascular;  Laterality: Left;   [place] Left 04/17/2018   Procedure: BASILIC VEIN TRANSPOSITION SECOND STAGE;  Surgeon: [name], MD;  Location: [facility];  Service: Vascular;  Laterality: Left;   BIOPSY  10/20/2019   Procedure: BIOPSY;  Surgeon: Clarene Essex, MD;  Location: [facility];  Service: Endoscopy;;   COLONOSCOPY     DENTAL SURGERY     ESOPHAGOGASTRODUODENOSCOPY (EGD) WITH PROPOFOL N/A 10/20/2019   Procedure: ESOPHAGOGASTRODUODENOSCOPY (EGD) WITH PROPOFOL;  Surgeon: Clarene Essex, MD;  Location: [facility];  Service: Endoscopy;  Laterality: N/A;   INSERTION OF DIALYSIS CATHETER Right 02/25/2018   Procedure: INSERTION OF DIALYSIS CATHETER;  Surgeon: [name], MD;  Location: [facility];  Service: Vascular;  Laterality: Right;   PERIPHERAL VASCULAR BALLOON ANGIOPLASTY  06/03/2018   Procedure: PERIPHERAL VASCULAR BALLOON ANGIOPLASTY;  Surgeon: Marty Heck, MD;  Location: [facility];  Service: Cardiovascular;;  left a/v fistula   FAMILY HISTORY Family History  Problem Relation Age of Onset   Kidney failure Mother    Diabetes Father    Blindness Brother    SOCIAL HISTORY Social History   Tobacco Use   Smoking status: Former   Smokeless  tobacco: Never  Scientific laboratory technician Use: Never used  Substance Use Topics   Alcohol use: No   Drug use: Not Currently    Types: Cocaine, Marijuana    Comment: smoked marijuana a few days ago; he denies using cocaine       OPHTHALMIC EXAM:  Base Eye Exam     Visual Acuity (Snellen - Linear)       Right Left   Dist [place] 20/25 20/40 -2   Dist ph [place] NI NI         Tonometry (Tonopen, 9:12 AM)       Right Left   Pressure 22 14         Pupils       Dark Light Shape React APD   Right 2 1 Round Brisk None   Left 2 1 Round Brisk None         Visual Fields (  Counting fingers)       Left Right    Full Full         Extraocular Movement       Right Left    Full, Ortho Full, Ortho         Neuro/Psych     Oriented x3: Yes   Mood/Affect: Normal         Dilation     Both eyes: 1.0% Mydriacyl, 2.5% Phenylephrine @ 9:12 AM           Slit Lamp and Fundus Exam     Slit Lamp Exam       Right Left   Lids/Lashes Dermatochalasis - upper lid Dermatochalasis - upper lid   Conjunctiva/Sclera Melanosis Melanosis, temporal pinguecula   Cornea 1+Punctate epithelial erosions, mild arcus, mild tear film debris 1+Punctate epithelial erosions, mild arcus   Anterior Chamber Deep and quiet Deep and quiet   [place]ris Round and dilated, No NVI Round and poorly dilated, No NVI   Lens 2-3+ Nuclear sclerosis, 2-3+ Cortical cataract 2-3+ Nuclear sclerosis, 2-3+ Cortical cataract   Vitreous Vitreous syneresis, old white VH settled inferiorly Vitreous syneresis, blood stained vitreous condensations--slightly improved, blood clots settling inferiorly         Fundus Exam       Right Left   Disc Mild Pallor, Sharp rim, focal PPP/PPA hazy view improved, Mild Pallor, Sharp rim   C/D Ratio 0.4 0.6   Macula Flat, Good foveal reflex, RPE mottling and clumping, no edema, trace MA hazy view improved, Flat, Blunted foveal reflex, RPE mottling and clumping, scattered Microaneurysms/DBH  greatest temporal macula    Vessels attenuated, mild Tortuousity, mild [place] wiring, nasal NV improved/regressing mild attenuation, mild [place] wiring, IN NV    Periphery Attached, 360 IRH/DBH - improved, 360 PRP with room for fill in Hazy view, Attached, scattered IRH/DBH greatest nasally, good 360 PRP changes, No RT/RD           IMAGING AND PROCEDURES  Imaging and Procedures for 12/29/2020  OCT, Retina - OU - Both Eyes       Right Eye Quality was good. Central Foveal Thickness: 248. Progression has been stable. Findings include no IRF, no SRF, normal foveal contour, intraretinal hyper-reflective material (Trace vitreous opacities, irregular lamination, mild atrophy).   Left Eye Quality was good. Central Foveal Thickness: 253. Progression has improved. Findings include normal foveal contour, no SRF, no IRF (mild interval improvement in vitreous opacities; Non-central IRF temporal macula caught on widefield--persistent ).   Notes *Images captured and stored on drive  Diagnosis / Impression:  OD: NFP; no IRF/SRF; trace vitreous opacities; irregular lamination, mild atrophy OS: mild interval improvement in vitreous opacities; NFP, no SRF; Non-central IRF temporal macula caught on widefield--persistent  Clinical management:  See below  Abbreviations: NFP - Normal foveal profile. CME - cystoid macular edema. PED - pigment epithelial detachment. IRF - intraretinal fluid. SRF - subretinal fluid. EZ - ellipsoid zone. ERM - epiretinal membrane. ORA - outer retinal atrophy. ORT - outer retinal tubulation. SRHM - subretinal hyper-reflective material. IRHM - intraretinal hyper-reflective material      Intravitreal Injection, Pharmacologic Agent - OS - Left Eye       Time Out 12/29/2020. 9:36 AM. Confirmed correct patient, procedure, site, and patient consented.   Anesthesia Topical anesthesia was used. Anesthetic medications included Lidocaine 2%, Proparacaine 0.5%.    Procedure Preparation included 5% betadine to ocular surface, eyelid speculum. A supplied needle was used.   Injection: 1.25  mg Bevacizumab 1.'25mg'$ /0.19m   Route: Intravitreal, Site: Left Eye   NDC: [id number] Lot: [id number]'$ , Expiration date: 02/07/2021, Waste: 0 mL   Post-op Post injection exam found visual acuity of at least counting fingers. The patient tolerated the procedure well. There were no complications. The patient received written and verbal post procedure care education.             ASSESSMENT/PLAN:   ICD-10-CM   1. Posterior vitreous detachment of left eye  H43.812     2. Vitreous hemorrhage of left eye (HCC)  H43.12     3. Proliferative diabetic retinopathy of both eyes with macular edema associated with type 2 diabetes mellitus (HCC)  E11/29/2022Intravitreal Injection, Pharmacologic Agent - OS - Left Eye    Bevacizumab (AVASTIN) SOLN 1.25 mg    4. Retinal edema  H35.81 OCT, Retina - OU - Both Eyes    5. Vitreous hemorrhage, right eye ([place]  H43.11     6. Essential hypertension  I10     7. Hypertensive retinopathy of both eyes  H35.033     8. Combined forms of age-related cataract of both eyes  H25.813     1,2. Hemorrhagic PVD OS  - Onset mid-August 2022 w/ new onset floaters, no photopsias  - s/p IVA OS #4 (09.09.22) - today, VA improved to 20/40 from 20/80 in 1 wk - pt states they have been holding heparin at dialysis for the last week - exam shows interval improvement in vitreous heme -- clearing and settling inferiorly - pt reports previously receiving heparin during dialysis (T, Th, Sat) -- may have contributed to interval worsening  - Discussed findings and prognosis  - No RT or RD on 360 peripheral exam  - Reviewed s/s of RT/RD  - Strict return precautions for any such RT/RD signs/symptoms  - VH precautions reviewed -- minimize activities, keep head elevated, hold heparin if able, avoid ASA/NSAIDS  - recommend IVA OS #5 today, 10.07.22  -  pt wishes to proceed  - RBA of procedure discussed, questions answered - informed consent obtained and signed - see procedure note  - F/U 4 week  3,4. Proliferative diabetic retinopathy OU (OD > OS) - s/p IVA OD #1 (12.08.21), #2 (01.11.22), #3 (02.09.22) - s/p IVA OS #1 (12.10.21), #2 (01.11.22), #3 (02.09.22), #4 (09.09.22) - FA (12.08.21) shows late-leaking MA, vascular nonperfusion, +NV OU (nasal midzone) - s/p PRP OD (01.26.22) - s/p PRP OS (03.09.22) - BCVA 20/25 OD, 20/70 OS (down from 20/20) - exam shows preretinal/subhyaloid heme and VH OD improved w/ white heme settled inferiorly, OS with blood stained vit condensations improving from hemorrhagic PVD (see above), and blood clots settling inferiorly  - OCT OD: trace vitreous opacities; irregular lamination, mild atrophy; OS: Non-central IRF temporal macula caught on widefield--slightly improved, mild interval improvement in vitreous opacities  - repeat FA 6.6.22 shows interval improvement in NVE/leakage OU s/p PRP OU-- just minimal leakage remains   - recommend holding heparin at dialysis if able--gave note for patient to take to dialysis on 12/23/2020  - recommend IVA OS #5 today, 10.07.22  - pt wishes to proceed  - RBA of procedure discussed, questions answered - informed consent obtained and signed - see procedure note - f/u 4 weeks, DFE, OCT  5. Vitreous Hemorrhage OD -- improving  - acute onset with violent coughing spell  - underlying etiology related to NV/PDR as described above, but contributed by use of heparin for dialysis  - VH clearing and settling  inferiorly -- white blood clots in inferior periphery  - s/p PRP OD (01.26.22)  - VH precautions reviewed -- minimize activities, keep head elevated, avoid ASA/NSAIDs/blood thinners as able  - monitor  6,7. Hypertensive retinopathy OU - discussed importance of tight BP control - monitor   8. Mixed Cataract OU - The symptoms of cataract, surgical options, and  treatments and risks were discussed with patient - discussed diagnosis and progression - not yet visually significant - monitor for now  Ophthalmic Meds Ordered this visit:  Meds ordered this encounter  Medications   Bevacizumab (AVASTIN) SOLN 1.25 mg       Return in about 4 weeks (around 01/26/2021) for f/u PDR OU, DFE, OCT.  There are no Patient Instructions on file for this visit.  This document serves as a record of services personally performed by Gardiner Sleeper, MD, PhD. It was created on their behalf by Leonie Douglas, an ophthalmic technician. The creation of this record is the provider's dictation and/or activities during the visit.    Electronically signed by: Leonie Douglas COA, 12/29/20  12:57 PM  This document serves as a record of services personally performed by Gardiner Sleeper, MD, PhD. It was created on their behalf by San Jetty. Owens Shark, OA an ophthalmic technician. The creation of this record is the provider's dictation and/or activities during the visit.    Electronically signed by: San Jetty. Marguerita Merles 10.07.2022 12:57 PM  Gardiner Sleeper, M.D., Ph.D. Diseases & Surgery of the Retina and [place] 12/29/2020  I have reviewed the above documentation for accuracy and completeness, and I agree with the above. Gardiner Sleeper, M.D., Ph.D. 12/29/20 1:00 PM   Abbreviations: M myopia (nearsighted); A astigmatism; H hyperopia (farsighted); P presbyopia; Mrx spectacle prescription;  CTL contact lenses; OD right eye; OS left eye; OU both eyes  XT exotropia; ET esotropia; PEK punctate epithelial keratitis; PEE punctate epithelial erosions; DES dry eye syndrome; MGD meibomian gland dysfunction; ATs artificial tears; PFAT's preservative free artificial tears; [place] nuclear sclerotic cataract; PSC posterior subcapsular cataract; ERM epi-retinal membrane; PVD posterior vitreous detachment; RD retinal detachment; DM diabetes mellitus; DR diabetic  retinopathy; NPDR non-proliferative diabetic retinopathy; PDR proliferative diabetic retinopathy; CSME clinically significant macular edema; DME diabetic macular edema; dbh dot blot hemorrhages; CWS cotton wool spot; POAG primary open angle glaucoma; C/D cup-to-disc ratio; HVF humphrey visual field; GVF goldmann visual field; OCT optical coherence tomography; IOP intraocular pressure; BRVO Branch retinal vein occlusion; CRVO central retinal vein occlusion; CRAO central retinal artery occlusion; BRAO branch retinal artery occlusion; RT retinal tear; SB scleral buckle; PPV pars plana vitrectomy; VH Vitreous hemorrhage; PRP panretinal laser photocoagulation; IVK intravitreal kenalog; VMT vitreomacular traction; MH Macular hole;  NVD neovascularization of the disc; NVE neovascularization elsewhere; AREDS age related eye disease study; ARMD age related macular degeneration; POAG primary open angle glaucoma; EBMD epithelial/anterior basement membrane dystrophy; ACIOL anterior chamber intraocular lens; IOL intraocular lens; PCIOL posterior chamber intraocular lens; Phaco/IOL phacoemulsification with intraocular lens placement; [place] photorefractive keratectomy; LASIK laser assisted in situ keratomileusis; HTN hypertension; DM diabetes mellitus; COPD chronic obstructive pulmonary disease

## 2020-12-28 DIAGNOSIS — Z992 Dependence on renal dialysis: Secondary | ICD-10-CM | POA: Diagnosis not present

## 2020-12-28 DIAGNOSIS — E1129 Type 2 diabetes mellitus with other diabetic kidney complication: Secondary | ICD-10-CM | POA: Diagnosis not present

## 2020-12-28 DIAGNOSIS — N2581 Secondary hyperparathyroidism of renal origin: Secondary | ICD-10-CM | POA: Diagnosis not present

## 2020-12-28 DIAGNOSIS — N186 End stage renal disease: Secondary | ICD-10-CM | POA: Diagnosis not present

## 2020-12-28 DIAGNOSIS — L299 Pruritus, unspecified: Secondary | ICD-10-CM | POA: Diagnosis not present

## 2020-12-28 DIAGNOSIS — R52 Pain, unspecified: Secondary | ICD-10-CM | POA: Diagnosis not present

## 2020-12-29 ENCOUNTER — Encounter (INDEPENDENT_AMBULATORY_CARE_PROVIDER_SITE_OTHER): Payer: Self-pay | Admitting: Ophthalmology

## 2020-12-29 ENCOUNTER — Ambulatory Visit (INDEPENDENT_AMBULATORY_CARE_PROVIDER_SITE_OTHER): Payer: Medicare Other | Admitting: Ophthalmology

## 2020-12-29 ENCOUNTER — Other Ambulatory Visit: Payer: Self-pay

## 2020-12-29 DIAGNOSIS — I10 Essential (primary) hypertension: Secondary | ICD-10-CM

## 2020-12-29 DIAGNOSIS — E113513 Type 2 diabetes mellitus with proliferative diabetic retinopathy with macular edema, bilateral: Secondary | ICD-10-CM

## 2020-12-29 DIAGNOSIS — H4312 Vitreous hemorrhage, left eye: Secondary | ICD-10-CM

## 2020-12-29 DIAGNOSIS — H25813 Combined forms of age-related cataract, bilateral: Secondary | ICD-10-CM

## 2020-12-29 DIAGNOSIS — H3581 Retinal edema: Secondary | ICD-10-CM

## 2020-12-29 DIAGNOSIS — H43812 Vitreous degeneration, left eye: Secondary | ICD-10-CM

## 2020-12-29 DIAGNOSIS — H4313 Vitreous hemorrhage, bilateral: Secondary | ICD-10-CM | POA: Diagnosis not present

## 2020-12-29 DIAGNOSIS — H4311 Vitreous hemorrhage, right eye: Secondary | ICD-10-CM

## 2020-12-29 DIAGNOSIS — H35033 Hypertensive retinopathy, bilateral: Secondary | ICD-10-CM | POA: Diagnosis not present

## 2020-12-29 MED ORDER — BEVACIZUMAB CHEMO INJECTION 1.25MG/0.05ML SYRINGE FOR KALEIDOSCOPE
1.2500 mg | INTRAVITREAL | Status: AC | PRN
  Administered 2020-12-29: 1.25 mg via INTRAVITREAL

## 2020-12-30 DIAGNOSIS — Z992 Dependence on renal dialysis: Secondary | ICD-10-CM | POA: Diagnosis not present

## 2020-12-30 DIAGNOSIS — N186 End stage renal disease: Secondary | ICD-10-CM | POA: Diagnosis not present

## 2020-12-30 DIAGNOSIS — N2581 Secondary hyperparathyroidism of renal origin: Secondary | ICD-10-CM | POA: Diagnosis not present

## 2020-12-30 DIAGNOSIS — L299 Pruritus, unspecified: Secondary | ICD-10-CM | POA: Diagnosis not present

## 2020-12-30 DIAGNOSIS — E1129 Type 2 diabetes mellitus with other diabetic kidney complication: Secondary | ICD-10-CM | POA: Diagnosis not present

## 2020-12-30 DIAGNOSIS — R52 Pain, unspecified: Secondary | ICD-10-CM | POA: Diagnosis not present

## 2021-01-02 DIAGNOSIS — Z992 Dependence on renal dialysis: Secondary | ICD-10-CM | POA: Diagnosis not present

## 2021-01-02 DIAGNOSIS — L299 Pruritus, unspecified: Secondary | ICD-10-CM | POA: Diagnosis not present

## 2021-01-02 DIAGNOSIS — N186 End stage renal disease: Secondary | ICD-10-CM | POA: Diagnosis not present

## 2021-01-02 DIAGNOSIS — N2581 Secondary hyperparathyroidism of renal origin: Secondary | ICD-10-CM | POA: Diagnosis not present

## 2021-01-02 DIAGNOSIS — E1129 Type 2 diabetes mellitus with other diabetic kidney complication: Secondary | ICD-10-CM | POA: Diagnosis not present

## 2021-01-02 DIAGNOSIS — R52 Pain, unspecified: Secondary | ICD-10-CM | POA: Diagnosis not present

## 2021-01-04 DIAGNOSIS — E1129 Type 2 diabetes mellitus with other diabetic kidney complication: Secondary | ICD-10-CM | POA: Diagnosis not present

## 2021-01-04 DIAGNOSIS — N186 End stage renal disease: Secondary | ICD-10-CM | POA: Diagnosis not present

## 2021-01-04 DIAGNOSIS — N2581 Secondary hyperparathyroidism of renal origin: Secondary | ICD-10-CM | POA: Diagnosis not present

## 2021-01-04 DIAGNOSIS — R52 Pain, unspecified: Secondary | ICD-10-CM | POA: Diagnosis not present

## 2021-01-04 DIAGNOSIS — L299 Pruritus, unspecified: Secondary | ICD-10-CM | POA: Diagnosis not present

## 2021-01-04 DIAGNOSIS — Z992 Dependence on renal dialysis: Secondary | ICD-10-CM | POA: Diagnosis not present

## 2021-01-06 DIAGNOSIS — N2581 Secondary hyperparathyroidism of renal origin: Secondary | ICD-10-CM | POA: Diagnosis not present

## 2021-01-06 DIAGNOSIS — L299 Pruritus, unspecified: Secondary | ICD-10-CM | POA: Diagnosis not present

## 2021-01-06 DIAGNOSIS — Z992 Dependence on renal dialysis: Secondary | ICD-10-CM | POA: Diagnosis not present

## 2021-01-06 DIAGNOSIS — E1129 Type 2 diabetes mellitus with other diabetic kidney complication: Secondary | ICD-10-CM | POA: Diagnosis not present

## 2021-01-06 DIAGNOSIS — N186 End stage renal disease: Secondary | ICD-10-CM | POA: Diagnosis not present

## 2021-01-06 DIAGNOSIS — R52 Pain, unspecified: Secondary | ICD-10-CM | POA: Diagnosis not present

## 2021-01-09 DIAGNOSIS — R52 Pain, unspecified: Secondary | ICD-10-CM | POA: Diagnosis not present

## 2021-01-09 DIAGNOSIS — N186 End stage renal disease: Secondary | ICD-10-CM | POA: Diagnosis not present

## 2021-01-09 DIAGNOSIS — Z992 Dependence on renal dialysis: Secondary | ICD-10-CM | POA: Diagnosis not present

## 2021-01-09 DIAGNOSIS — L299 Pruritus, unspecified: Secondary | ICD-10-CM | POA: Diagnosis not present

## 2021-01-09 DIAGNOSIS — N2581 Secondary hyperparathyroidism of renal origin: Secondary | ICD-10-CM | POA: Diagnosis not present

## 2021-01-09 DIAGNOSIS — E1129 Type 2 diabetes mellitus with other diabetic kidney complication: Secondary | ICD-10-CM | POA: Diagnosis not present

## 2021-01-10 DIAGNOSIS — I129 Hypertensive chronic kidney disease with stage 1 through stage 4 chronic kidney disease, or unspecified chronic kidney disease: Secondary | ICD-10-CM | POA: Diagnosis not present

## 2021-01-10 DIAGNOSIS — E103513 Type 1 diabetes mellitus with proliferative diabetic retinopathy with macular edema, bilateral: Secondary | ICD-10-CM | POA: Diagnosis not present

## 2021-01-10 DIAGNOSIS — N185 Chronic kidney disease, stage 5: Secondary | ICD-10-CM | POA: Diagnosis not present

## 2021-01-10 DIAGNOSIS — I700 Atherosclerosis of aorta: Secondary | ICD-10-CM | POA: Diagnosis not present

## 2021-01-10 DIAGNOSIS — E1165 Type 2 diabetes mellitus with hyperglycemia: Secondary | ICD-10-CM | POA: Diagnosis not present

## 2021-01-10 DIAGNOSIS — E782 Mixed hyperlipidemia: Secondary | ICD-10-CM | POA: Diagnosis not present

## 2021-01-10 DIAGNOSIS — Z992 Dependence on renal dialysis: Secondary | ICD-10-CM | POA: Diagnosis not present

## 2021-01-11 DIAGNOSIS — E1129 Type 2 diabetes mellitus with other diabetic kidney complication: Secondary | ICD-10-CM | POA: Diagnosis not present

## 2021-01-11 DIAGNOSIS — N186 End stage renal disease: Secondary | ICD-10-CM | POA: Diagnosis not present

## 2021-01-11 DIAGNOSIS — Z992 Dependence on renal dialysis: Secondary | ICD-10-CM | POA: Diagnosis not present

## 2021-01-11 DIAGNOSIS — R52 Pain, unspecified: Secondary | ICD-10-CM | POA: Diagnosis not present

## 2021-01-11 DIAGNOSIS — L299 Pruritus, unspecified: Secondary | ICD-10-CM | POA: Diagnosis not present

## 2021-01-11 DIAGNOSIS — N2581 Secondary hyperparathyroidism of renal origin: Secondary | ICD-10-CM | POA: Diagnosis not present

## 2021-01-13 DIAGNOSIS — N2581 Secondary hyperparathyroidism of renal origin: Secondary | ICD-10-CM | POA: Diagnosis not present

## 2021-01-13 DIAGNOSIS — Z992 Dependence on renal dialysis: Secondary | ICD-10-CM | POA: Diagnosis not present

## 2021-01-13 DIAGNOSIS — R52 Pain, unspecified: Secondary | ICD-10-CM | POA: Diagnosis not present

## 2021-01-13 DIAGNOSIS — N186 End stage renal disease: Secondary | ICD-10-CM | POA: Diagnosis not present

## 2021-01-13 DIAGNOSIS — E1129 Type 2 diabetes mellitus with other diabetic kidney complication: Secondary | ICD-10-CM | POA: Diagnosis not present

## 2021-01-13 DIAGNOSIS — L299 Pruritus, unspecified: Secondary | ICD-10-CM | POA: Diagnosis not present

## 2021-01-16 DIAGNOSIS — N186 End stage renal disease: Secondary | ICD-10-CM | POA: Diagnosis not present

## 2021-01-16 DIAGNOSIS — R52 Pain, unspecified: Secondary | ICD-10-CM | POA: Diagnosis not present

## 2021-01-16 DIAGNOSIS — L299 Pruritus, unspecified: Secondary | ICD-10-CM | POA: Diagnosis not present

## 2021-01-16 DIAGNOSIS — Z992 Dependence on renal dialysis: Secondary | ICD-10-CM | POA: Diagnosis not present

## 2021-01-16 DIAGNOSIS — N2581 Secondary hyperparathyroidism of renal origin: Secondary | ICD-10-CM | POA: Diagnosis not present

## 2021-01-16 DIAGNOSIS — E1129 Type 2 diabetes mellitus with other diabetic kidney complication: Secondary | ICD-10-CM | POA: Diagnosis not present

## 2021-01-18 DIAGNOSIS — L299 Pruritus, unspecified: Secondary | ICD-10-CM | POA: Diagnosis not present

## 2021-01-18 DIAGNOSIS — Z992 Dependence on renal dialysis: Secondary | ICD-10-CM | POA: Diagnosis not present

## 2021-01-18 DIAGNOSIS — E1129 Type 2 diabetes mellitus with other diabetic kidney complication: Secondary | ICD-10-CM | POA: Diagnosis not present

## 2021-01-18 DIAGNOSIS — N2581 Secondary hyperparathyroidism of renal origin: Secondary | ICD-10-CM | POA: Diagnosis not present

## 2021-01-18 DIAGNOSIS — N186 End stage renal disease: Secondary | ICD-10-CM | POA: Diagnosis not present

## 2021-01-18 DIAGNOSIS — R52 Pain, unspecified: Secondary | ICD-10-CM | POA: Diagnosis not present

## 2021-01-18 NOTE — [facility] Notes (Signed)
[facility] Note  01/26/2021     CHIEF COMPLAINT Patient presents for Retina Follow Up   HISTORY OF PRESENT ILLNESS: Jonathan Mcneil is a 65 y.o. male who presents to the clinic today for:   HPI     Retina Follow Up   Patient presents with  Retinal Break/Detachment.  In left eye.  Severity is moderate.  Duration of 4 weeks.  Since onset it is stable.  I, the attending physician,  performed the HPI with the patient and updated documentation appropriately.        Comments   Pt here for 4 wk ret f/u for PDR OU. Pt states in the past week he has noticed film over OD, does report some flashes and floaters that come and go. Pt did not check blood sugar this am.       Last edited by Bernarda Caffey, MD on 01/26/2021 12:40 PM.     Patient states  Referring physician: Merrilee Seashore, [address] [place],  [place] [address]  HISTORICAL INFORMATION:   Selected notes from the MEDICAL RECORD NUMBER Referred by Dr. Maryjane Hurter for heme OD   CURRENT MEDICATIONS: No current outpatient medications on file. (Ophthalmic Drugs)   No current facility-administered medications for this visit. (Ophthalmic Drugs)   Current Outpatient Medications (Other)  Medication Sig   acetaminophen (TYLENOL) 650 MG CR tablet Take 1,300 mg by mouth 2 (two) times daily as needed for pain.   azithromycin (ZITHROMAX Z-PAK) 250 MG tablet Take 1 tablet (250 mg total) by mouth daily.   B Complex-C-Folic Acid (DIALYVITE PO) Take 1 tablet by mouth daily.   benzonatate (TESSALON) 100 MG capsule Take 1 capsule (100 mg total) by mouth every 8 (eight) hours.   calcitRIOL (ROCALTROL) 0.25 MCG capsule Take 1 capsule (0.25 mcg total) by mouth every Monday, Wednesday, and Friday with hemodialysis.   gabapentin (NEURONTIN) 300 MG capsule Take 300 mg by mouth at bedtime.   ondansetron (ZOFRAN) 8 MG tablet Take 8 mg by mouth every 8 (eight) hours as needed.   pantoprazole  (PROTONIX) 20 MG tablet Take 1 tablet (20 mg total) by mouth daily.   promethazine (PHENERGAN) 25 MG suppository Place 1 suppository (25 mg total) rectally every 8 (eight) hours as needed for nausea or vomiting.   sertraline (ZOLOFT) 25 MG tablet Take 25 mg by mouth every Monday, Wednesday, and Friday.   sucralfate (CARAFATE) 1 g tablet Take 1 tablet (1 g total) by mouth 4 (four) times daily -  with meals and at bedtime.   sucroferric oxyhydroxide (VELPHORO) 500 MG chewable tablet Chew 1,000 mg by mouth 3 (three) times daily with meals.   VELPHORO 500 MG chewable tablet Chew 500 mg by mouth 3 (three) times daily.   No current facility-administered medications for this visit. (Other)   REVIEW OF SYSTEMS: ROS   Positive for: Genitourinary, Endocrine, Eyes, Psychiatric Negative for: Constitutional, Gastrointestinal, Neurological, Skin, Musculoskeletal, HENT, Cardiovascular, Respiratory, Allergic/Imm, Heme/Lymph Last edited by Kingsley Spittle, COT on 01/26/2021  9:02 AM.     ALLERGIES Allergies  Allergen Reactions   Ibuprofen Other (See Comments)    Unknown reaction   Penicillins Itching and Other (See Comments)    Has patient had a PCN reaction causing immediate rash, facial/tongue/throat swelling, SOB or lightheadedness with hypotension: No Has patient had a PCN reaction causing severe rash involving mucus membranes or skin necrosis: No Has patient had a PCN reaction that required  hospitalization No Has patient had a PCN reaction occurring within the last 10 years: Unknown If all of the above answers are "NO", then may proceed with Cephalosporin use.   Venlafaxine     Other reaction(s): insomnia   PAST MEDICAL HISTORY Past Medical History:  Diagnosis Date   Cataract    Diabetes mellitus without complication ([place])    Diabetic retinopathy ([place])    ESRD (end stage renal disease) ([place])    GERD (gastroesophageal reflux disease)    PMH   Hypertension    Hypertensive retinopathy     Low back pain    Metabolic acidosis    Neuromuscular disorder ([place])    peripheral neuropathy   Pancreatitis    Schizophrenia ([place])    does not take medications   Past Surgical History:  Procedure Laterality Date   A/V FISTULAGRAM Left 06/03/2018   Procedure: A/V FISTULAGRAM;  Surgeon: Marty Heck, MD;  Location: [facility];  Service: Cardiovascular;  Laterality: Left;   AV FISTULA PLACEMENT Left 02/11/2018   Procedure: INSERTION OF ARTERIOVENOUS (AV) FISTULA LEFT  ARM;  Surgeon: [name], MD;  Location: [facility];  Service: Vascular;  Laterality: Left;   [place] Left 04/17/2018   Procedure: BASILIC VEIN TRANSPOSITION SECOND STAGE;  Surgeon: [name], MD;  Location: [facility];  Service: Vascular;  Laterality: Left;   BIOPSY  10/20/2019   Procedure: BIOPSY;  Surgeon: Clarene Essex, MD;  Location: [facility];  Service: Endoscopy;;   COLONOSCOPY     DENTAL SURGERY     ESOPHAGOGASTRODUODENOSCOPY (EGD) WITH PROPOFOL N/A 10/20/2019   Procedure: ESOPHAGOGASTRODUODENOSCOPY (EGD) WITH PROPOFOL;  Surgeon: Clarene Essex, MD;  Location: [facility];  Service: Endoscopy;  Laterality: N/A;   INSERTION OF DIALYSIS CATHETER Right 02/25/2018   Procedure: INSERTION OF DIALYSIS CATHETER;  Surgeon: [name], MD;  Location: [facility];  Service: Vascular;  Laterality: Right;   PERIPHERAL VASCULAR BALLOON ANGIOPLASTY  06/03/2018   Procedure: PERIPHERAL VASCULAR BALLOON ANGIOPLASTY;  Surgeon: Marty Heck, MD;  Location: [facility];  Service: Cardiovascular;;  left a/v fistula   FAMILY HISTORY Family History  Problem Relation Age of Onset   Kidney failure Mother    Diabetes Father    Blindness Brother    SOCIAL HISTORY Social History   Tobacco Use   Smoking status: Former   Smokeless tobacco: Never  Scientific laboratory technician Use: Never used  Substance Use Topics   Alcohol use: No   Drug use: Not Currently    Types:  Cocaine, Marijuana    Comment: smoked marijuana a few days ago; he denies using cocaine       OPHTHALMIC EXAM:  Base Eye Exam     Visual Acuity (Snellen - Linear)       Right Left   Dist [place] 20/100 -1 20/20 -2   Dist ph [place] NI          Tonometry (Tonopen, 9:17 AM)       Right Left   Pressure 25 19         Pupils       Dark Light Shape React APD   Right 2 1 Round Brisk None   Left 2 1 Round Brisk None         Visual Fields (Counting fingers)       Left Right    Full Full         Extraocular Movement  Right Left    Full, Ortho Full, Ortho         Neuro/Psych     Oriented x3: Yes   Mood/Affect: Normal         Dilation     Both eyes: 1.0% Mydriacyl, 2.5% Phenylephrine @ 9:21 AM           Slit Lamp and Fundus Exam     Slit Lamp Exam       Right Left   Lids/Lashes Dermatochalasis - upper lid Dermatochalasis - upper lid   Conjunctiva/Sclera Melanosis Melanosis, temporal pinguecula   Cornea 1+Punctate epithelial erosions, mild arcus, mild tear film debris 1+Punctate epithelial erosions, mild arcus   Anterior Chamber Deep and quiet Deep and quiet   Iris Round and dilated, No NVI Round and poorly dilated, No NVI   Lens 2-3+ Nuclear sclerosis, 2-3+ Cortical cataract 2-3+ Nuclear sclerosis, 2-3+ Cortical cataract   Vitreous Vitreous syneresis, diffuse VH, blood stained vitreous condensations greatest centrally Vitreous syneresis, blood stained vitreous condensations, blood clots settling inferiorly         Fundus Exam       Right Left   Disc Mild Pallor, Sharp rim, focal PPP/PPA hazy view improved, Mild Pallor, Sharp rim   C/D Ratio 0.4 0.6   Macula Flat, Good foveal reflex, RPE mottling and clumping, no edema, trace MA hazy view improved, Flat, Blunted foveal reflex, RPE mottling and clumping, scattered Microaneurysms/DBH greatest temporal macula, mild ERM   Vessels attenuated, mild Tortuousity, mild Copper wiring, nasal NV  improved/regressing mild attenuation, mild Copper wiring, IN NV    Periphery Attached, 360 IRH/DBH - improved, 360 PRP with room for fill in Hazy view, Attached, scattered IRH/DBH greatest nasally, good 360 PRP changes, No RT/RD           Refraction     Manifest Refraction       Sphere Cylinder Dist VA   Right -0.25 Sphere 20/100-2   Left              IMAGING AND PROCEDURES  Imaging and Procedures for 01/26/2021  OCT, Retina - OU - Both Eyes       Right Eye Quality was good. Central Foveal Thickness: 255. Progression has worsened. Findings include no IRF, no SRF, normal foveal contour, intraretinal hyper-reflective material (Interval increase in vitreous opacities -- new VH, irregular lamination, mild atrophy).   Left Eye Quality was good. Central Foveal Thickness: 267. Progression has improved. Findings include normal foveal contour, no SRF, no IRF (mild interval improvement in vitreous opacities; Non-central IRF temporal macula caught on widefield--persistent ).   Notes *Images captured and stored on drive  Diagnosis / Impression:  OD: NFP; no IRF/SRF; Interval increase in vitreous opacities -- new VH, irregular lamination, mild atrophy OS: mild interval improvement in vitreous opacities; NFP, no SRF; Non-central IRF temporal macula caught on widefield--persistent  Clinical management:  See below  Abbreviations: NFP - Normal foveal profile. CME - cystoid macular edema. PED - pigment epithelial detachment. IRF - intraretinal fluid. SRF - subretinal fluid. EZ - ellipsoid zone. ERM - epiretinal membrane. ORA - outer retinal atrophy. ORT - outer retinal tubulation. SRHM - subretinal hyper-reflective material. IRHM - intraretinal hyper-reflective material      Intravitreal Injection, Pharmacologic Agent - OD - Right Eye       Time Out 01/26/2021. 10:06 AM. Confirmed correct patient, procedure, site, and patient consented.   Anesthesia Topical anesthesia was used.  Anesthetic medications included Lidocaine 2%, Proparacaine 0.5%.  Procedure Preparation included 5% betadine to ocular surface, eyelid speculum. A supplied needle was used.   Injection: 1.25 mg Bevacizumab 1.25mg /0.38ml   Route: Intravitreal, Site: Right Eye   NDC: H061816, Lot: [id number] , Expiration date: 03/07/2021, Waste: 0 mL   Post-op Post injection exam found visual acuity of at least counting fingers. The patient tolerated the procedure well. There were no complications. The patient received written and verbal post procedure care education.      Intravitreal Injection, Pharmacologic Agent - OS - Left Eye       Time Out 01/26/2021. 10:09 AM. Confirmed correct patient, procedure, site, and patient consented.   Anesthesia Topical anesthesia was used. Anesthetic medications included Lidocaine 2%, Proparacaine 0.5%.   Procedure Preparation included 5% betadine to ocular surface, eyelid speculum. A (32g) needle was used.   Injection: 1.25 mg Bevacizumab 1.25mg /0.48ml   Route: Intravitreal, Site: Left Eye   NDC: 80m, [id number], Expiration date: 03/13/2021, Waste: 0.05 mL   Post-op Post injection exam found visual acuity of at least counting fingers. The patient tolerated the procedure well. There were no complications. The patient received written and verbal post procedure care education.            ASSESSMENT/PLAN:   ICD-10-CM   1. Posterior vitreous detachment of left eye  H43.812     2. Vitreous hemorrhage of left eye (HCC)  H43.12 Intravitreal Injection, Pharmacologic Agent - OS - Left Eye    Bevacizumab (AVASTIN) SOLN 1.25 mg    3. Proliferative diabetic retinopathy of both eyes with macular edema associated with type 2 diabetes mellitus (HCC)  03/26/2021 Intravitreal Injection, Pharmacologic Agent - OD - Right Eye    Intravitreal Injection, Pharmacologic Agent - OS - Left Eye    Bevacizumab (AVASTIN) SOLN 1.25 mg    Bevacizumab (AVASTIN) SOLN  1.25 mg    4. Retinal edema  H35.81 OCT, Retina - OU - Both Eyes    5. Vitreous hemorrhage, right eye (HCC)  H43.11 Intravitreal Injection, Pharmacologic Agent - OD - Right Eye    Bevacizumab (AVASTIN) SOLN 1.25 mg    6. Essential hypertension  I10     7. Hypertensive retinopathy of both eyes  H35.033     8. Combined forms of age-related cataract of both eyes  H25.813      1,2. Hemorrhagic PVD OS  - Onset mid-August 2022 w/ new onset floaters, no photopsias  - s/p IVA OS #4 (09.09.22), #5 (10.07.22) - today, VA improved to 20/20 OS from 20/40 and 20/80 prior - pt states they have been holding heparin at dialysis - exam shows interval improvement in vitreous heme -- clearing and settling inferiorly - pt reports previously receiving heparin during dialysis (T, Th, Sat) -- may have contributed to interval worsening  - Discussed findings and prognosis  - No RT or RD on 360 peripheral exam  - Reviewed s/s of RT/RD  - Strict return precautions for any such RT/RD signs/symptoms  - VH precautions reviewed -- minimize activities, keep head elevated, hold heparin if able, avoid ASA/NSAIDS  - recommend IVA OS #6 today, 11.04.22  - pt wishes to proceed  - RBA of procedure discussed, questions answered - informed consent obtained and signed - see procedure note  - F/U 4 week  3,4. Proliferative diabetic retinopathy OU (OD > OS) - s/p IVA OD #1 (12.08.21), #2 (01.11.22), #3 (02.09.22) - s/p IVA OS #1 (12.10.21), #2 (01.11.22), #3 (02.09.22), #4 (09.09.22), #5 (10.07.22) - FA (12.08.21) shows late-leaking MA,  vascular nonperfusion, +NV OU (nasal midzone) - s/p PRP OD (01.26.22) - s/p PRP OS (03.09.22) - BCVA 20/200 OD, 20/70 OS (down from 20/20) - exam shows new diffuse VH OD; OS with blood stained vit condensations improving from hemorrhagic PVD (see above), and blood clots settling inferiorly  - OCT   - repeat FA 6.6.22 shows interval improvement in NVE/leakage OU s/p PRP OU-- just  minimal leakage remains   - recommend holding heparin at dialysis if able--gave note for patient to take to dialysis on 12/23/2020  - recommend IVA OU (OD #4 and OS #6) today, 11.04.22 for [facility]  - pt wishes to proceed  - RBA of procedure discussed, questions answered - informed consent obtained and signed - see procedure note - f/u 4 weeks, DFE, OCT, possible injections  5. Vitreous Hemorrhage OD -- interval worsening  - acute worsening occurred on Saturday, 10.29  - prior onset with violent coughing spell  - underlying etiology related to NV/PDR as described above, but contributed by use of heparin for dialysis  - s/p PRP OD (01.26.22)  - recommend IVA OD as above  - VH precautions reviewed -- minimize activities, keep head elevated, avoid ASA/NSAIDs/blood thinners as able  - monitor   6,7. Hypertensive retinopathy OU - discussed importance of tight BP control - monitor    8. Mixed Cataract OU - The symptoms of cataract, surgical options, and treatments and risks were discussed with patient - discussed diagnosis and progression - not yet visually significant - monitor for now  Ophthalmic Meds Ordered this visit:  Meds ordered this encounter  Medications   Bevacizumab (AVASTIN) SOLN 1.25 mg   Bevacizumab (AVASTIN) SOLN 1.25 mg     Return in about 4 weeks (around 02/23/2021) for f/u PDR OU, DFE, OCT.  There are no Patient Instructions on file for this visit.  This document serves as a record of services personally performed by Gardiner Sleeper, MD, PhD. It was created on their behalf by Leonie Douglas, an ophthalmic technician. The creation of this record is the provider's dictation and/or activities during the visit.    Electronically signed by: Leonie Douglas COA, 01/26/21  12:50 PM  This document serves as a record of services personally performed by Gardiner Sleeper, MD, PhD. It was created on their behalf by San Jetty. Owens Shark, OA an ophthalmic technician. The creation of this  record is the provider's dictation and/or activities during the visit.    Electronically signed by: San Jetty. Marguerita Merles 11.04.2022 12:50 PM  Gardiner Sleeper, M.D., Ph.D. Diseases & Surgery of the Retina and [place] 01/26/2021  I have reviewed the above documentation for accuracy and completeness, and I agree with the above. Gardiner Sleeper, M.D., Ph.D. 01/26/21 12:50 PM   Abbreviations: M myopia (nearsighted); A astigmatism; H hyperopia (farsighted); P presbyopia; Mrx spectacle prescription;  CTL contact lenses; OD right eye; OS left eye; OU both eyes  XT exotropia; ET esotropia; PEK punctate epithelial keratitis; PEE punctate epithelial erosions; DES dry eye syndrome; MGD meibomian gland dysfunction; ATs artificial tears; PFAT's preservative free artificial tears; [place] nuclear sclerotic cataract; PSC posterior subcapsular cataract; ERM epi-retinal membrane; PVD posterior vitreous detachment; RD retinal detachment; DM diabetes mellitus; DR diabetic retinopathy; NPDR non-proliferative diabetic retinopathy; PDR proliferative diabetic retinopathy; CSME clinically significant macular edema; DME diabetic macular edema; dbh dot blot hemorrhages; CWS cotton wool spot; POAG primary open angle glaucoma; C/D cup-to-disc ratio; HVF humphrey visual field; GVF goldmann visual  field; OCT optical coherence tomography; IOP intraocular pressure; BRVO Branch retinal vein occlusion; CRVO central retinal vein occlusion; CRAO central retinal artery occlusion; BRAO branch retinal artery occlusion; RT retinal tear; SB scleral buckle; PPV pars plana vitrectomy; VH Vitreous hemorrhage; PRP panretinal laser photocoagulation; IVK intravitreal kenalog; VMT vitreomacular traction; MH Macular hole;  NVD neovascularization of the disc; NVE neovascularization elsewhere; AREDS age related eye disease study; ARMD age related macular degeneration; POAG primary open angle glaucoma; EBMD  epithelial/anterior basement membrane dystrophy; ACIOL anterior chamber intraocular lens; IOL intraocular lens; PCIOL posterior chamber intraocular lens; Phaco/IOL phacoemulsification with intraocular lens placement; [place] photorefractive keratectomy; LASIK laser assisted in situ keratomileusis; HTN hypertension; DM diabetes mellitus; COPD chronic obstructive pulmonary disease

## 2021-01-20 DIAGNOSIS — R52 Pain, unspecified: Secondary | ICD-10-CM | POA: Diagnosis not present

## 2021-01-20 DIAGNOSIS — L299 Pruritus, unspecified: Secondary | ICD-10-CM | POA: Diagnosis not present

## 2021-01-20 DIAGNOSIS — N186 End stage renal disease: Secondary | ICD-10-CM | POA: Diagnosis not present

## 2021-01-20 DIAGNOSIS — Z992 Dependence on renal dialysis: Secondary | ICD-10-CM | POA: Diagnosis not present

## 2021-01-20 DIAGNOSIS — E1129 Type 2 diabetes mellitus with other diabetic kidney complication: Secondary | ICD-10-CM | POA: Diagnosis not present

## 2021-01-20 DIAGNOSIS — N2581 Secondary hyperparathyroidism of renal origin: Secondary | ICD-10-CM | POA: Diagnosis not present

## 2021-01-22 DIAGNOSIS — E1122 Type 2 diabetes mellitus with diabetic chronic kidney disease: Secondary | ICD-10-CM | POA: Diagnosis not present

## 2021-01-22 DIAGNOSIS — Z992 Dependence on renal dialysis: Secondary | ICD-10-CM | POA: Diagnosis not present

## 2021-01-22 DIAGNOSIS — N186 End stage renal disease: Secondary | ICD-10-CM | POA: Diagnosis not present

## 2021-01-23 DIAGNOSIS — N186 End stage renal disease: Secondary | ICD-10-CM | POA: Diagnosis not present

## 2021-01-23 DIAGNOSIS — R52 Pain, unspecified: Secondary | ICD-10-CM | POA: Diagnosis not present

## 2021-01-23 DIAGNOSIS — D631 Anemia in chronic kidney disease: Secondary | ICD-10-CM | POA: Diagnosis not present

## 2021-01-23 DIAGNOSIS — Z992 Dependence on renal dialysis: Secondary | ICD-10-CM | POA: Diagnosis not present

## 2021-01-23 DIAGNOSIS — N2581 Secondary hyperparathyroidism of renal origin: Secondary | ICD-10-CM | POA: Diagnosis not present

## 2021-01-23 DIAGNOSIS — L299 Pruritus, unspecified: Secondary | ICD-10-CM | POA: Diagnosis not present

## 2021-01-23 DIAGNOSIS — E1129 Type 2 diabetes mellitus with other diabetic kidney complication: Secondary | ICD-10-CM | POA: Diagnosis not present

## 2021-01-25 DIAGNOSIS — N2581 Secondary hyperparathyroidism of renal origin: Secondary | ICD-10-CM | POA: Diagnosis not present

## 2021-01-25 DIAGNOSIS — R52 Pain, unspecified: Secondary | ICD-10-CM | POA: Diagnosis not present

## 2021-01-25 DIAGNOSIS — L299 Pruritus, unspecified: Secondary | ICD-10-CM | POA: Diagnosis not present

## 2021-01-25 DIAGNOSIS — Z992 Dependence on renal dialysis: Secondary | ICD-10-CM | POA: Diagnosis not present

## 2021-01-25 DIAGNOSIS — E1129 Type 2 diabetes mellitus with other diabetic kidney complication: Secondary | ICD-10-CM | POA: Diagnosis not present

## 2021-01-25 DIAGNOSIS — N186 End stage renal disease: Secondary | ICD-10-CM | POA: Diagnosis not present

## 2021-01-25 DIAGNOSIS — D631 Anemia in chronic kidney disease: Secondary | ICD-10-CM | POA: Diagnosis not present

## 2021-01-26 ENCOUNTER — Encounter (INDEPENDENT_AMBULATORY_CARE_PROVIDER_SITE_OTHER): Payer: Self-pay | Admitting: Ophthalmology

## 2021-01-26 ENCOUNTER — Other Ambulatory Visit: Payer: Self-pay

## 2021-01-26 ENCOUNTER — Ambulatory Visit (INDEPENDENT_AMBULATORY_CARE_PROVIDER_SITE_OTHER): Payer: Medicare Other | Admitting: Ophthalmology

## 2021-01-26 DIAGNOSIS — H25813 Combined forms of age-related cataract, bilateral: Secondary | ICD-10-CM

## 2021-01-26 DIAGNOSIS — H3581 Retinal edema: Secondary | ICD-10-CM

## 2021-01-26 DIAGNOSIS — H35033 Hypertensive retinopathy, bilateral: Secondary | ICD-10-CM | POA: Diagnosis not present

## 2021-01-26 DIAGNOSIS — I10 Essential (primary) hypertension: Secondary | ICD-10-CM | POA: Diagnosis not present

## 2021-01-26 DIAGNOSIS — H4313 Vitreous hemorrhage, bilateral: Secondary | ICD-10-CM | POA: Diagnosis not present

## 2021-01-26 DIAGNOSIS — H4312 Vitreous hemorrhage, left eye: Secondary | ICD-10-CM

## 2021-01-26 DIAGNOSIS — E113513 Type 2 diabetes mellitus with proliferative diabetic retinopathy with macular edema, bilateral: Secondary | ICD-10-CM

## 2021-01-26 DIAGNOSIS — H43812 Vitreous degeneration, left eye: Secondary | ICD-10-CM | POA: Diagnosis not present

## 2021-01-26 DIAGNOSIS — H4311 Vitreous hemorrhage, right eye: Secondary | ICD-10-CM

## 2021-01-26 MED ORDER — BEVACIZUMAB CHEMO INJECTION 1.25MG/0.05ML SYRINGE FOR KALEIDOSCOPE
1.2500 mg | INTRAVITREAL | Status: AC | PRN
  Administered 2021-01-26: 1.25 mg via INTRAVITREAL

## 2021-01-27 DIAGNOSIS — N2581 Secondary hyperparathyroidism of renal origin: Secondary | ICD-10-CM | POA: Diagnosis not present

## 2021-01-27 DIAGNOSIS — R52 Pain, unspecified: Secondary | ICD-10-CM | POA: Diagnosis not present

## 2021-01-27 DIAGNOSIS — L299 Pruritus, unspecified: Secondary | ICD-10-CM | POA: Diagnosis not present

## 2021-01-27 DIAGNOSIS — E1129 Type 2 diabetes mellitus with other diabetic kidney complication: Secondary | ICD-10-CM | POA: Diagnosis not present

## 2021-01-27 DIAGNOSIS — N186 End stage renal disease: Secondary | ICD-10-CM | POA: Diagnosis not present

## 2021-01-27 DIAGNOSIS — D631 Anemia in chronic kidney disease: Secondary | ICD-10-CM | POA: Diagnosis not present

## 2021-01-27 DIAGNOSIS — Z992 Dependence on renal dialysis: Secondary | ICD-10-CM | POA: Diagnosis not present

## 2021-01-30 DIAGNOSIS — E1129 Type 2 diabetes mellitus with other diabetic kidney complication: Secondary | ICD-10-CM | POA: Diagnosis not present

## 2021-01-30 DIAGNOSIS — D631 Anemia in chronic kidney disease: Secondary | ICD-10-CM | POA: Diagnosis not present

## 2021-01-30 DIAGNOSIS — Z992 Dependence on renal dialysis: Secondary | ICD-10-CM | POA: Diagnosis not present

## 2021-01-30 DIAGNOSIS — N186 End stage renal disease: Secondary | ICD-10-CM | POA: Diagnosis not present

## 2021-01-30 DIAGNOSIS — N2581 Secondary hyperparathyroidism of renal origin: Secondary | ICD-10-CM | POA: Diagnosis not present

## 2021-01-30 DIAGNOSIS — L299 Pruritus, unspecified: Secondary | ICD-10-CM | POA: Diagnosis not present

## 2021-01-30 DIAGNOSIS — R52 Pain, unspecified: Secondary | ICD-10-CM | POA: Diagnosis not present

## 2021-02-01 DIAGNOSIS — L299 Pruritus, unspecified: Secondary | ICD-10-CM | POA: Diagnosis not present

## 2021-02-01 DIAGNOSIS — E1129 Type 2 diabetes mellitus with other diabetic kidney complication: Secondary | ICD-10-CM | POA: Diagnosis not present

## 2021-02-01 DIAGNOSIS — N2581 Secondary hyperparathyroidism of renal origin: Secondary | ICD-10-CM | POA: Diagnosis not present

## 2021-02-01 DIAGNOSIS — N186 End stage renal disease: Secondary | ICD-10-CM | POA: Diagnosis not present

## 2021-02-01 DIAGNOSIS — Z992 Dependence on renal dialysis: Secondary | ICD-10-CM | POA: Diagnosis not present

## 2021-02-01 DIAGNOSIS — D631 Anemia in chronic kidney disease: Secondary | ICD-10-CM | POA: Diagnosis not present

## 2021-02-01 DIAGNOSIS — R52 Pain, unspecified: Secondary | ICD-10-CM | POA: Diagnosis not present

## 2021-02-03 DIAGNOSIS — Z992 Dependence on renal dialysis: Secondary | ICD-10-CM | POA: Diagnosis not present

## 2021-02-03 DIAGNOSIS — L299 Pruritus, unspecified: Secondary | ICD-10-CM | POA: Diagnosis not present

## 2021-02-03 DIAGNOSIS — R52 Pain, unspecified: Secondary | ICD-10-CM | POA: Diagnosis not present

## 2021-02-03 DIAGNOSIS — N2581 Secondary hyperparathyroidism of renal origin: Secondary | ICD-10-CM | POA: Diagnosis not present

## 2021-02-03 DIAGNOSIS — E1129 Type 2 diabetes mellitus with other diabetic kidney complication: Secondary | ICD-10-CM | POA: Diagnosis not present

## 2021-02-03 DIAGNOSIS — D631 Anemia in chronic kidney disease: Secondary | ICD-10-CM | POA: Diagnosis not present

## 2021-02-03 DIAGNOSIS — N186 End stage renal disease: Secondary | ICD-10-CM | POA: Diagnosis not present

## 2021-02-06 DIAGNOSIS — D631 Anemia in chronic kidney disease: Secondary | ICD-10-CM | POA: Diagnosis not present

## 2021-02-06 DIAGNOSIS — Z992 Dependence on renal dialysis: Secondary | ICD-10-CM | POA: Diagnosis not present

## 2021-02-06 DIAGNOSIS — E1129 Type 2 diabetes mellitus with other diabetic kidney complication: Secondary | ICD-10-CM | POA: Diagnosis not present

## 2021-02-06 DIAGNOSIS — N2581 Secondary hyperparathyroidism of renal origin: Secondary | ICD-10-CM | POA: Diagnosis not present

## 2021-02-06 DIAGNOSIS — R52 Pain, unspecified: Secondary | ICD-10-CM | POA: Diagnosis not present

## 2021-02-06 DIAGNOSIS — N186 End stage renal disease: Secondary | ICD-10-CM | POA: Diagnosis not present

## 2021-02-06 DIAGNOSIS — L299 Pruritus, unspecified: Secondary | ICD-10-CM | POA: Diagnosis not present

## 2021-02-06 DIAGNOSIS — Z1152 Encounter for screening for COVID-19: Secondary | ICD-10-CM | POA: Diagnosis not present

## 2021-02-08 DIAGNOSIS — E1129 Type 2 diabetes mellitus with other diabetic kidney complication: Secondary | ICD-10-CM | POA: Diagnosis not present

## 2021-02-08 DIAGNOSIS — N186 End stage renal disease: Secondary | ICD-10-CM | POA: Diagnosis not present

## 2021-02-08 DIAGNOSIS — D631 Anemia in chronic kidney disease: Secondary | ICD-10-CM | POA: Diagnosis not present

## 2021-02-08 DIAGNOSIS — R52 Pain, unspecified: Secondary | ICD-10-CM | POA: Diagnosis not present

## 2021-02-08 DIAGNOSIS — Z992 Dependence on renal dialysis: Secondary | ICD-10-CM | POA: Diagnosis not present

## 2021-02-08 DIAGNOSIS — L299 Pruritus, unspecified: Secondary | ICD-10-CM | POA: Diagnosis not present

## 2021-02-08 DIAGNOSIS — N2581 Secondary hyperparathyroidism of renal origin: Secondary | ICD-10-CM | POA: Diagnosis not present

## 2021-02-08 NOTE — Progress Notes (Deleted)
[facility] Note  02/12/2021     CHIEF COMPLAINT Patient presents for No chief complaint on file.   HISTORY OF PRESENT ILLNESS: Jonathan Mcneil is a 65 y.o. male who presents to the clinic today for:     Patient states  Referring physician: Merrilee Seashore, [place],  [place] [id number]  HISTORICAL INFORMATION:   Selected notes from the MEDICAL RECORD NUMBER Referred by Dr. Maryjane Hurter for heme OD   CURRENT MEDICATIONS: No current outpatient medications on file. (Ophthalmic Drugs)   No current facility-administered medications for this visit. (Ophthalmic Drugs)   Current Outpatient Medications (Other)  Medication Sig   acetaminophen (TYLENOL) 650 MG CR tablet Take 1,300 mg by mouth 2 (two) times daily as needed for pain.   azithromycin (ZITHROMAX Z-PAK) 250 MG tablet Take 1 tablet (250 mg total) by mouth daily.   B Complex-C-Folic Acid (DIALYVITE PO) Take 1 tablet by mouth daily.   benzonatate (TESSALON) 100 MG capsule Take 1 capsule (100 mg total) by mouth every 8 (eight) hours.   calcitRIOL (ROCALTROL) 0.25 MCG capsule Take 1 capsule (0.25 mcg total) by mouth every Monday, Wednesday, and Friday with hemodialysis.   gabapentin (NEURONTIN) 300 MG capsule Take 300 mg by mouth at bedtime.   ondansetron (ZOFRAN) 8 MG tablet Take 8 mg by mouth every 8 (eight) hours as needed.   pantoprazole (PROTONIX) 20 MG tablet Take 1 tablet (20 mg total) by mouth daily.   promethazine (PHENERGAN) 25 MG suppository Place 1 suppository (25 mg total) rectally every 8 (eight) hours as needed for nausea or vomiting.   sertraline (ZOLOFT) 25 MG tablet Take 25 mg by mouth every Monday, Wednesday, and Friday.   sucralfate (CARAFATE) 1 g tablet Take 1 tablet (1 g total) by mouth 4 (four) times daily -  with meals and at bedtime.   sucroferric oxyhydroxide (VELPHORO) 500 MG chewable tablet Chew 1,000 mg by mouth 3 (three) times daily with  meals.   VELPHORO 500 MG chewable tablet Chew 500 mg by mouth 3 (three) times daily.   No current facility-administered medications for this visit. (Other)   REVIEW OF SYSTEMS:   ALLERGIES Allergies  Allergen Reactions   Ibuprofen Other (See Comments)    Unknown reaction   Penicillins Itching and Other (See Comments)    Has patient had a PCN reaction causing immediate rash, facial/tongue/throat swelling, SOB or lightheadedness with hypotension: No Has patient had a PCN reaction causing severe rash involving mucus membranes or skin necrosis: No Has patient had a PCN reaction that required hospitalization No Has patient had a PCN reaction occurring within the last 10 years: Unknown If all of the above answers are "NO", then may proceed with Cephalosporin use.   Venlafaxine     Other reaction(s): insomnia   PAST MEDICAL HISTORY Past Medical History:  Diagnosis Date   Cataract    Diabetes mellitus without complication ([place])    Diabetic retinopathy ([place])    ESRD (end stage renal disease) ([place])    GERD (gastroesophageal reflux disease)    PMH   Hypertension    Hypertensive retinopathy    Low back pain    Metabolic acidosis    Neuromuscular disorder ([place])    peripheral neuropathy   Pancreatitis    Schizophrenia ([place])    does not take medications   Past Surgical History:  Procedure Laterality Date   A/V FISTULAGRAM Left 06/03/2018   Procedure: A/V FISTULAGRAM;  Surgeon: Marty Heck, MD;  Location: [facility];  Service: Cardiovascular;  Laterality: Left;   AV FISTULA PLACEMENT Left 02/11/2018   Procedure: INSERTION OF ARTERIOVENOUS (AV) FISTULA LEFT  ARM;  Surgeon: [name], MD;  Location: [facility];  Service: Vascular;  Laterality: Left;   [place] Left 04/17/2018   Procedure: BASILIC VEIN TRANSPOSITION SECOND STAGE;  Surgeon: [name], MD;  Location: [place];  Service: Vascular;  Laterality: Left;   BIOPSY   10/20/2019   Procedure: BIOPSY;  Surgeon: Clarene Essex, MD;  Location: [facility];  Service: Endoscopy;;   COLONOSCOPY     DENTAL SURGERY     ESOPHAGOGASTRODUODENOSCOPY (EGD) WITH PROPOFOL N/A 10/20/2019   Procedure: ESOPHAGOGASTRODUODENOSCOPY (EGD) WITH PROPOFOL;  Surgeon: Clarene Essex, MD;  Location: [facility];  Service: Endoscopy;  Laterality: N/A;   INSERTION OF DIALYSIS CATHETER Right 02/25/2018   Procedure: INSERTION OF DIALYSIS CATHETER;  Surgeon: [name], MD;  Location: [facility];  Service: Vascular;  Laterality: Right;   PERIPHERAL VASCULAR BALLOON ANGIOPLASTY  06/03/2018   Procedure: PERIPHERAL VASCULAR BALLOON ANGIOPLASTY;  Surgeon: Marty Heck, MD;  Location: [facility];  Service: Cardiovascular;;  left a/v fistula   FAMILY HISTORY Family History  Problem Relation Age of Onset   Kidney failure Mother    Diabetes Father    Blindness Brother    SOCIAL HISTORY Social History   Tobacco Use   Smoking status: Former   Smokeless tobacco: Never  Scientific laboratory technician Use: Never used  Substance Use Topics   Alcohol use: No   Drug use: Not Currently    Types: Cocaine, Marijuana    Comment: smoked marijuana a few days ago; he denies using cocaine       OPHTHALMIC EXAM:  Not recorded    IMAGING AND PROCEDURES  Imaging and Procedures for 02/12/2021          ASSESSMENT/PLAN: No diagnosis found.  1,2. Hemorrhagic PVD OS  - Onset mid-August 2022 w/ new onset floaters, no photopsias  - s/p IVA OS #4 (09.09.22), #5 (10.07.22), #6 (11.04.22) - today, VA improved to 20/20 OS from 20/40 and 20/80 prior - pt states they have been holding heparin at dialysis - exam shows interval improvement in vitreous heme -- clearing and settling inferiorly - pt reports previously receiving heparin during dialysis (T, Th, Sat) -- may have contributed to interval worsening  - Discussed findings and prognosis  - No RT or RD on 360 peripheral exam  -  Reviewed s/s of RT/RD  - Strict return precautions for any such RT/RD signs/symptoms  - VH precautions reviewed -- minimize activities, keep head elevated, hold heparin if able, avoid ASA/NSAIDS  - recommend IVA   - pt wishes to proceed  - RBA of procedure discussed, questions answered - informed consent obtained and signed - see procedure note  - F/U 4 week  3,4. Proliferative diabetic retinopathy OU (OD > OS) - s/p IVA OD #1 (12.08.21), #2 (01.11.22), #3 (02.09.22), #4 (11.04.22) - s/p IVA OS #1 (12.10.21), #2 (01.11.22), #3 (02.09.22), #4 (09.09.22), #5 (10.07.22), #6 (11.04.22) - FA (12.08.21) shows late-leaking MA, vascular nonperfusion, +NV OU (nasal midzone) - s/p PRP OD (01.26.22) - s/p PRP OS (03.09.22) - BCVA 20/200 OD, 20/70 OS (down from 20/20) - exam shows new diffuse VH OD; OS with blood stained vit condensations improving from hemorrhagic PVD (see above), and blood clots settling inferiorly  - OCT   -  repeat FA 6.6.22 shows interval improvement in NVE/leakage OU s/p PRP OU-- just minimal leakage remains   - recommend holding heparin at dialysis if able--gave note for patient to take to dialysis on 12/23/2020  - recommend IVA OU (OD #4 and OS #6) today, 11.04.22 for [facility]  - pt wishes to proceed  - RBA of procedure discussed, questions answered - informed consent obtained and signed - see procedure note - f/u 4 weeks, DFE, OCT, possible injections  5. Vitreous Hemorrhage OD -- interval worsening  - acute worsening occurred on Saturday, 10.29  - prior onset with violent coughing spell  - underlying etiology related to NV/PDR as described above, but contributed by use of heparin for dialysis  - s/p PRP OD (01.26.22)  - recommend IVA OD as above  - VH precautions reviewed -- minimize activities, keep head elevated, avoid ASA/NSAIDs/blood thinners as able  - monitor  6,7. Hypertensive retinopathy OU - discussed importance of tight BP control - monitor  8. Mixed  Cataract OU - The symptoms of cataract, surgical options, and treatments and risks were discussed with patient - discussed diagnosis and progression - not yet visually significant - monitor for now  Ophthalmic Meds Ordered this visit:  No orders of the defined types were placed in this encounter.    No follow-ups on file.  There are no Patient Instructions on file for this visit.  This document serves as a record of services personally performed by Gardiner Sleeper, MD, PhD. It was created on their behalf by Roselee Nova, COMT. The creation of this record is the provider's dictation and/or activities during the visit.  Electronically signed by: Roselee Nova, COMT 02/08/21 9:54 AM    Gardiner Sleeper, M.D., Ph.D. Diseases & Surgery of the Retina and [place] 01/26/2021     Abbreviations: M myopia (nearsighted); A astigmatism; H hyperopia (farsighted); P presbyopia; Mrx spectacle prescription;  CTL contact lenses; OD right eye; OS left eye; OU both eyes  XT exotropia; ET esotropia; PEK punctate epithelial keratitis; PEE punctate epithelial erosions; DES dry eye syndrome; MGD meibomian gland dysfunction; ATs artificial tears; PFAT's preservative free artificial tears; [place] nuclear sclerotic cataract; PSC posterior subcapsular cataract; ERM epi-retinal membrane; PVD posterior vitreous detachment; RD retinal detachment; DM diabetes mellitus; DR diabetic retinopathy; NPDR non-proliferative diabetic retinopathy; PDR proliferative diabetic retinopathy; CSME clinically significant macular edema; DME diabetic macular edema; dbh dot blot hemorrhages; CWS cotton wool spot; POAG primary open angle glaucoma; C/D cup-to-disc ratio; HVF humphrey visual field; GVF goldmann visual field; OCT optical coherence tomography; IOP intraocular pressure; BRVO Branch retinal vein occlusion; CRVO central retinal vein occlusion; CRAO central retinal artery occlusion; BRAO branch  retinal artery occlusion; RT retinal tear; SB scleral buckle; PPV pars plana vitrectomy; VH Vitreous hemorrhage; PRP panretinal laser photocoagulation; IVK intravitreal kenalog; VMT vitreomacular traction; MH Macular hole;  NVD neovascularization of the disc; NVE neovascularization elsewhere; AREDS age related eye disease study; ARMD age related macular degeneration; POAG primary open angle glaucoma; EBMD epithelial/anterior basement membrane dystrophy; ACIOL anterior chamber intraocular lens; IOL intraocular lens; PCIOL posterior chamber intraocular lens; Phaco/IOL phacoemulsification with intraocular lens placement; [place] photorefractive keratectomy; LASIK laser assisted in situ keratomileusis; HTN hypertension; DM diabetes mellitus; COPD chronic obstructive pulmonary disease

## 2021-02-10 DIAGNOSIS — Z992 Dependence on renal dialysis: Secondary | ICD-10-CM | POA: Diagnosis not present

## 2021-02-10 DIAGNOSIS — D631 Anemia in chronic kidney disease: Secondary | ICD-10-CM | POA: Diagnosis not present

## 2021-02-10 DIAGNOSIS — E1129 Type 2 diabetes mellitus with other diabetic kidney complication: Secondary | ICD-10-CM | POA: Diagnosis not present

## 2021-02-10 DIAGNOSIS — R52 Pain, unspecified: Secondary | ICD-10-CM | POA: Diagnosis not present

## 2021-02-10 DIAGNOSIS — L299 Pruritus, unspecified: Secondary | ICD-10-CM | POA: Diagnosis not present

## 2021-02-10 DIAGNOSIS — N186 End stage renal disease: Secondary | ICD-10-CM | POA: Diagnosis not present

## 2021-02-10 DIAGNOSIS — N2581 Secondary hyperparathyroidism of renal origin: Secondary | ICD-10-CM | POA: Diagnosis not present

## 2021-02-12 ENCOUNTER — Other Ambulatory Visit: Payer: Self-pay

## 2021-02-12 ENCOUNTER — Encounter (INDEPENDENT_AMBULATORY_CARE_PROVIDER_SITE_OTHER): Payer: Medicare Other | Admitting: Ophthalmology

## 2021-02-12 ENCOUNTER — Encounter (INDEPENDENT_AMBULATORY_CARE_PROVIDER_SITE_OTHER): Payer: Self-pay

## 2021-02-12 DIAGNOSIS — H40033 Anatomical narrow angle, bilateral: Secondary | ICD-10-CM | POA: Diagnosis not present

## 2021-02-12 DIAGNOSIS — H2513 Age-related nuclear cataract, bilateral: Secondary | ICD-10-CM | POA: Diagnosis not present

## 2021-02-13 DIAGNOSIS — R52 Pain, unspecified: Secondary | ICD-10-CM | POA: Diagnosis not present

## 2021-02-13 DIAGNOSIS — L299 Pruritus, unspecified: Secondary | ICD-10-CM | POA: Diagnosis not present

## 2021-02-13 DIAGNOSIS — N186 End stage renal disease: Secondary | ICD-10-CM | POA: Diagnosis not present

## 2021-02-13 DIAGNOSIS — Z992 Dependence on renal dialysis: Secondary | ICD-10-CM | POA: Diagnosis not present

## 2021-02-13 DIAGNOSIS — N2581 Secondary hyperparathyroidism of renal origin: Secondary | ICD-10-CM | POA: Diagnosis not present

## 2021-02-13 DIAGNOSIS — E1129 Type 2 diabetes mellitus with other diabetic kidney complication: Secondary | ICD-10-CM | POA: Diagnosis not present

## 2021-02-13 DIAGNOSIS — D631 Anemia in chronic kidney disease: Secondary | ICD-10-CM | POA: Diagnosis not present

## 2021-02-13 NOTE — Progress Notes (Signed)
This encounter was created in error - please disregard.

## 2021-02-16 DIAGNOSIS — E1129 Type 2 diabetes mellitus with other diabetic kidney complication: Secondary | ICD-10-CM | POA: Diagnosis not present

## 2021-02-16 DIAGNOSIS — L299 Pruritus, unspecified: Secondary | ICD-10-CM | POA: Diagnosis not present

## 2021-02-16 DIAGNOSIS — D631 Anemia in chronic kidney disease: Secondary | ICD-10-CM | POA: Diagnosis not present

## 2021-02-16 DIAGNOSIS — N186 End stage renal disease: Secondary | ICD-10-CM | POA: Diagnosis not present

## 2021-02-16 DIAGNOSIS — N2581 Secondary hyperparathyroidism of renal origin: Secondary | ICD-10-CM | POA: Diagnosis not present

## 2021-02-16 DIAGNOSIS — Z992 Dependence on renal dialysis: Secondary | ICD-10-CM | POA: Diagnosis not present

## 2021-02-16 DIAGNOSIS — R52 Pain, unspecified: Secondary | ICD-10-CM | POA: Diagnosis not present

## 2021-02-18 DIAGNOSIS — E1129 Type 2 diabetes mellitus with other diabetic kidney complication: Secondary | ICD-10-CM | POA: Diagnosis not present

## 2021-02-18 DIAGNOSIS — Z992 Dependence on renal dialysis: Secondary | ICD-10-CM | POA: Diagnosis not present

## 2021-02-18 DIAGNOSIS — N186 End stage renal disease: Secondary | ICD-10-CM | POA: Diagnosis not present

## 2021-02-18 DIAGNOSIS — N2581 Secondary hyperparathyroidism of renal origin: Secondary | ICD-10-CM | POA: Diagnosis not present

## 2021-02-18 DIAGNOSIS — L299 Pruritus, unspecified: Secondary | ICD-10-CM | POA: Diagnosis not present

## 2021-02-18 DIAGNOSIS — D631 Anemia in chronic kidney disease: Secondary | ICD-10-CM | POA: Diagnosis not present

## 2021-02-18 DIAGNOSIS — R52 Pain, unspecified: Secondary | ICD-10-CM | POA: Diagnosis not present

## 2021-02-20 DIAGNOSIS — E1129 Type 2 diabetes mellitus with other diabetic kidney complication: Secondary | ICD-10-CM | POA: Diagnosis not present

## 2021-02-20 DIAGNOSIS — N186 End stage renal disease: Secondary | ICD-10-CM | POA: Diagnosis not present

## 2021-02-20 DIAGNOSIS — Z1152 Encounter for screening for COVID-19: Secondary | ICD-10-CM | POA: Diagnosis not present

## 2021-02-20 DIAGNOSIS — D631 Anemia in chronic kidney disease: Secondary | ICD-10-CM | POA: Diagnosis not present

## 2021-02-20 DIAGNOSIS — Z992 Dependence on renal dialysis: Secondary | ICD-10-CM | POA: Diagnosis not present

## 2021-02-20 DIAGNOSIS — R52 Pain, unspecified: Secondary | ICD-10-CM | POA: Diagnosis not present

## 2021-02-20 DIAGNOSIS — N2581 Secondary hyperparathyroidism of renal origin: Secondary | ICD-10-CM | POA: Diagnosis not present

## 2021-02-20 DIAGNOSIS — L299 Pruritus, unspecified: Secondary | ICD-10-CM | POA: Diagnosis not present

## 2021-02-20 NOTE — Progress Notes (Signed)
[facility] Note  02/23/2021     CHIEF COMPLAINT Patient presents for Retina Follow Up   HISTORY OF PRESENT ILLNESS: Jonathan Mcneil is a 64 y.o. male who presents to the clinic today for:   HPI     Retina Follow Up   Patient presents with  Diabetic Retinopathy.  In both eyes.  Severity is moderate.  Duration of 4 weeks.  Since onset it is gradually improving.        Comments   4 week retina follow up for PDROU. Patient states vision is a lot better.      Last edited by Elmore Guise, COT on 02/23/2021  9:16 AM.    Patient states  Referring physician: Merrilee Seashore, MD [id number],  [place] [id number]  HISTORICAL INFORMATION:   Selected notes from the MEDICAL RECORD NUMBER Referred by Dr. Maryjane Hurter for heme OD   CURRENT MEDICATIONS: No current outpatient medications on file. (Ophthalmic Drugs)   No current facility-administered medications for this visit. (Ophthalmic Drugs)   Current Outpatient Medications (Other)  Medication Sig   acetaminophen (TYLENOL) 650 MG CR tablet Take 1,300 mg by mouth 2 (two) times daily as needed for pain.   azithromycin (ZITHROMAX Z-PAK) 250 MG tablet Take 1 tablet (250 mg total) by mouth daily.   B Complex-C-Folic Acid (DIALYVITE PO) Take 1 tablet by mouth daily.   benzonatate (TESSALON) 100 MG capsule Take 1 capsule (100 mg total) by mouth every 8 (eight) hours.   calcitRIOL (ROCALTROL) 0.25 MCG capsule Take 1 capsule (0.25 mcg total) by mouth every Monday, Wednesday, and Friday with hemodialysis.   gabapentin (NEURONTIN) 300 MG capsule Take 300 mg by mouth at bedtime.   ondansetron (ZOFRAN) 8 MG tablet Take 8 mg by mouth every 8 (eight) hours as needed.   pantoprazole (PROTONIX) 20 MG tablet Take 1 tablet (20 mg total) by mouth daily.   promethazine (PHENERGAN) 25 MG suppository Place 1 suppository (25 mg total) rectally every 8 (eight) hours as needed for nausea or vomiting.    sertraline (ZOLOFT) 25 MG tablet Take 25 mg by mouth every Monday, Wednesday, and Friday.   sucralfate (CARAFATE) 1 g tablet Take 1 tablet (1 g total) by mouth 4 (four) times daily -  with meals and at bedtime.   sucroferric oxyhydroxide (VELPHORO) 500 MG chewable tablet Chew 1,000 mg by mouth 3 (three) times daily with meals.   VELPHORO 500 MG chewable tablet Chew 500 mg by mouth 3 (three) times daily.   No current facility-administered medications for this visit. (Other)   REVIEW OF SYSTEMS: ROS   Positive for: Genitourinary, Endocrine, Eyes, Psychiatric Negative for: Constitutional, Gastrointestinal, Neurological, Skin, Musculoskeletal, HENT, Cardiovascular, Respiratory, Allergic/Imm, Heme/Lymph Last edited by Elmore Guise, COT on 02/23/2021  9:10 AM.      ALLERGIES Allergies  Allergen Reactions   Ibuprofen Other (See Comments)    Unknown reaction   Penicillins Itching and Other (See Comments)    Has patient had a PCN reaction causing immediate rash, facial/tongue/throat swelling, SOB or lightheadedness with hypotension: No Has patient had a PCN reaction causing severe rash involving mucus membranes or skin necrosis: No Has patient had a PCN reaction that required hospitalization No Has patient had a PCN reaction occurring within the last 10 years: Unknown If all of the above answers are "NO", then may proceed with Cephalosporin use.   Venlafaxine     Other reaction(s): insomnia  PAST MEDICAL HISTORY Past Medical History:  Diagnosis Date   Cataract    Diabetes mellitus without complication ([place])    Diabetic retinopathy ([place])    ESRD (end stage renal disease) ([place])    GERD (gastroesophageal reflux disease)    PMH   Hypertension    Hypertensive retinopathy    Low back pain    Metabolic acidosis    Neuromuscular disorder ([place])    peripheral neuropathy   Pancreatitis    Schizophrenia ([place])    does not take medications   Past Surgical History:  Procedure Laterality  Date   A/V FISTULAGRAM Left 06/03/2018   Procedure: A/V FISTULAGRAM;  Surgeon: Marty Heck, MD;  Location: [facility];  Service: Cardiovascular;  Laterality: Left;   AV FISTULA PLACEMENT Left 02/11/2018   Procedure: INSERTION OF ARTERIOVENOUS (AV) FISTULA LEFT  ARM;  Surgeon: [name], MD;  Location: [facility];  Service: Vascular;  Laterality: Left;   [place] Left 04/17/2018   Procedure: BASILIC VEIN TRANSPOSITION SECOND STAGE;  Surgeon: [name], MD;  Location: [facility];  Service: Vascular;  Laterality: Left;   BIOPSY  10/20/2019   Procedure: BIOPSY;  Surgeon: Clarene Essex, MD;  Location: [facility];  Service: Endoscopy;;   COLONOSCOPY     DENTAL SURGERY     ESOPHAGOGASTRODUODENOSCOPY (EGD) WITH PROPOFOL N/A 10/20/2019   Procedure: ESOPHAGOGASTRODUODENOSCOPY (EGD) WITH PROPOFOL;  Surgeon: Clarene Essex, MD;  Location: [facility];  Service: Endoscopy;  Laterality: N/A;   INSERTION OF DIALYSIS CATHETER Right 02/25/2018   Procedure: INSERTION OF DIALYSIS CATHETER;  Surgeon: [name], MD;  Location: [facility];  Service: Vascular;  Laterality: Right;   PERIPHERAL VASCULAR BALLOON ANGIOPLASTY  06/03/2018   Procedure: PERIPHERAL VASCULAR BALLOON ANGIOPLASTY;  Surgeon: Marty Heck, MD;  Location: [facility];  Service: Cardiovascular;;  left a/v fistula   FAMILY HISTORY Family History  Problem Relation Age of Onset   Kidney failure Mother    Diabetes Father    Blindness Brother    SOCIAL HISTORY Social History   Tobacco Use   Smoking status: Former   Smokeless tobacco: Never  Scientific laboratory technician Use: Never used  Substance Use Topics   Alcohol use: No   Drug use: Not Currently    Types: Cocaine, Marijuana    Comment: smoked marijuana a few days ago; he denies using cocaine       OPHTHALMIC EXAM:  Base Eye Exam     Visual Acuity (Snellen - Linear)       Right Left   Dist [place] 20/30-2 20/20-1    Dist ph [place] 20/NI          Tonometry (Tonopen, 9:15 AM)       Right Left   Pressure 19 21         Pupils       Dark Light Shape React APD   Right 3 2 Round Brisk None   Left 3 2 Round Brisk None         Visual Fields (Counting fingers)       Left Right    Full Full         Extraocular Movement       Right Left    Full, Ortho Full, Ortho         Neuro/Psych     Oriented x3: Yes   Mood/Affect: Normal         Dilation  Both eyes: 1.0% Mydriacyl, 2.5% Phenylephrine @ 9:15 AM           Slit Lamp and Fundus Exam     Slit Lamp Exam       Right Left   Lids/Lashes Dermatochalasis - upper lid Dermatochalasis - upper lid   Conjunctiva/Sclera Melanosis Melanosis, temporal pinguecula   Cornea 1+Punctate epithelial erosions, mild arcus, mild tear film debris 1+Punctate epithelial erosions, mild arcus   Anterior Chamber Deep and quiet Deep and quiet   Iris Round and dilated, No NVI Round and poorly dilated, No NVI   Lens 2-3+ Nuclear sclerosis, 2-3+ Cortical cataract 2-3+ Nuclear sclerosis, 2-3+ Cortical cataract   Anterior Vitreous Vitreous syneresis, diffuse VH- improved, residual blood stained vitreous condensations settled inferiorly Vitreous syneresis, blood stained vitreous condensations - improving, blood clots settling inferiorly         Fundus Exam       Right Left   Disc Mild Pallor, Sharp rim, focal PPP/PPA Mild Pallor, Sharp rim   C/D Ratio 0.4 0.6   Macula Flat, Good foveal reflex, RPE mottling and clumping, no edema, trace MA Flat, Blunted foveal reflex, ERM, RPE mottling and clumping, scattered Microaneurysms/DBH greatest temporal macula   Vessels attenuated, mild Tortuousity, mild Copper wiring, nasal NV improved/regressing mild attenuation, mild Copper wiring, IN NV    Periphery Attached, 360 IRH/DBH - improved, pre-retinal heme settled inferiorly, 360 PRP with room for fill in Attached, scattered IRH/DBH greatest nasally, good 360  PRP changes, No RT/RD           IMAGING AND PROCEDURES  Imaging and Procedures for 02/23/2021  OCT, Retina - OU - Both Eyes       Right Eye Quality was good. Central Foveal Thickness: 245. Progression has improved. Findings include no IRF, no SRF, normal foveal contour, intraretinal hyper-reflective material (Massive interval improvement in vitreous opacities, residual vitreous opacities concentrated inferiorly).   Left Eye Quality was good. Central Foveal Thickness: 276. Progression has improved. Findings include normal foveal contour, no SRF, intraretinal fluid (interval improvement in vitreous opacities; persistent cystic changes temporal macula, ERM with blunted foveal contour).   Notes *Images captured and stored on drive  Diagnosis / Impression:  OD: Massive interval improvement in vitreous opacities, residual vitreous opacities concentrated inferiorly OS: interval improvement in vitreous opacities; persistent cystic changes temporal macula, ERM with blunted foveal contour  Clinical management:  See below  Abbreviations: NFP - Normal foveal profile. CME - cystoid macular edema. PED - pigment epithelial detachment. IRF - intraretinal fluid. SRF - subretinal fluid. EZ - ellipsoid zone. ERM - epiretinal membrane. ORA - outer retinal atrophy. ORT - outer retinal tubulation. SRHM - subretinal hyper-reflective material. IRHM - intraretinal hyper-reflective material      Intravitreal Injection, Pharmacologic Agent - OD - Right Eye       Time Out 02/23/2021. 10:15 AM. Confirmed correct patient, procedure, site, and patient consented.   Anesthesia Topical anesthesia was used. Anesthetic medications included Lidocaine 2%, Proparacaine 0.5%.   Procedure Preparation included 5% betadine to ocular surface, eyelid speculum. A supplied needle was used.   Injection: 1.25 mg Bevacizumab 1.25mg /0.77ml   Route: Intravitreal, Site: Right Eye   NDC: H061816, Lot: [id number] ,  Expiration date: 04/03/2021, Waste: 0.05 mL   Post-op Post injection exam found visual acuity of at least counting fingers. The patient tolerated the procedure well. There were no complications. The patient received written and verbal post procedure care education.      Intravitreal Injection, Pharmacologic Agent -  OS - Left Eye       Time Out 02/23/2021. 10:16 AM. Confirmed correct patient, procedure, site, and patient consented.   Anesthesia Topical anesthesia was used. Anesthetic medications included Lidocaine 2%, Proparacaine 0.5%.   Procedure Preparation included 5% betadine to ocular surface, eyelid speculum. A (32g) needle was used.   Injection: 1.25 mg Bevacizumab 1.25mg /0.19ml   Route: Intravitreal, Site: Left Eye   NDC: H061816, Lot: [id number], Expiration date: 03/28/2021, Waste: 0.05 mL   Post-op Post injection exam found visual acuity of at least counting fingers. The patient tolerated the procedure well. There were no complications. The patient received written and verbal post procedure care education.             ASSESSMENT/PLAN:   ICD-10-CM   1. Proliferative diabetic retinopathy of both eyes with macular edema associated with type 2 diabetes mellitus (HCC)  U13.2440 Intravitreal Injection, Pharmacologic Agent - OD - Right Eye    Intravitreal Injection, Pharmacologic Agent - OS - Left Eye    Bevacizumab (AVASTIN) SOLN 1.25 mg    Bevacizumab (AVASTIN) SOLN 1.25 mg    2. Vitreous hemorrhage of left eye (HCC)  H43.12 Intravitreal Injection, Pharmacologic Agent - OS - Left Eye    Bevacizumab (AVASTIN) SOLN 1.25 mg    3. Vitreous hemorrhage, right eye (HCC)  H43.11 Intravitreal Injection, Pharmacologic Agent - OD - Right Eye    Bevacizumab (AVASTIN) SOLN 1.25 mg    4. Retinal edema  H35.81 OCT, Retina - OU - Both Eyes    5. Posterior vitreous detachment of left eye  H43.812     6. Essential hypertension  I10     7. Hypertensive retinopathy of both eyes   H35.033     8. Combined forms of age-related cataract of both eyes  H25.813       1-4. Proliferative diabetic retinopathy w/ recurrent VH OU (OD > OS) - s/p IVA OD #1 (12.08.21), #2 (01.11.22), #3 (02.09.22), #4 (11.04.22) - s/p IVA OS #1 (12.10.21), #2 (01.11.22), #3 (02.09.22), #4 (09.09.22), #5 (10.07.22), #6 (11.04.22) - FA (12.08.21) shows late-leaking MA, vascular nonperfusion, +NV OU (nasal midzone) - s/p PRP OD (01.26.22) - s/p PRP OS (03.09.22) - BCVA 20/300 OD, 20/20 OS (improved OU) - exam shows massive interval improvement in VH OD; OS with blood stained vit condensations improving from hemorrhagic PVD, and blood clots settling inferiorly  - OCT shows OD: Massive interval improvement in vitreous opacities, residual vitreous opacities concentrated inferiorly; OS: interval improvement in vitreous opacities; persistent cystic changes temporal macula, ERM with blunted foveal contour  - repeat FA 6.6.22 shows interval improvement in NVE/leakage OU s/p PRP OU-- just minimal leakage remains   - recommend holding heparin at dialysis if able--gave note for patient to take to dialysis on 12/23/2020  - recommend IVA OU (OD #6 and OS #8) today, 12.02.22 for [facility]  - pt wishes to proceed  - RBA of procedure discussed, questions answered - informed consent obtained and signed - see procedure note - f/u 4 weeks, DFE, OCT, possible injections  5. Hemorrhagic PVD OS  - Onset mid-August 2022 w/ new onset floaters, no photopsias  - s/p IVA OS #4 (09.09.22), #5 (10.07.22), #6 (11.04.22) - today, VA improved to 20/20 OS from 20/40 and 20/80 prior - pt states they have been holding heparin at dialysis - exam shows interval improvement in vitreous heme -- clearing and settling inferiorly - pt reports previously receiving heparin during dialysis (T, Th, Sat) -- may have contributed  to interval worsening  - Discussed findings and prognosis  - No RT or RD on 360 peripheral exam  - Reviewed s/s of  RT/RD  - Strict return precautions for any such RT/RD signs/symptoms  - VH precautions reviewed -- minimize activities, keep head elevated, hold heparin if able, avoid ASA/NSAIDS  - recommend IVA OS #7 today, 12.02.22  - pt wishes to proceed  - RBA of procedure discussed, questions answered - informed consent obtained and signed - see procedure note  - F/U 4 week  6,7. Hypertensive retinopathy OU - discussed importance of tight BP control - monitor   8. Mixed Cataract OU - The symptoms of cataract, surgical options, and treatments and risks were discussed with patient - discussed diagnosis and progression - not yet visually significant - monitor for now  Ophthalmic Meds Ordered this visit:  Meds ordered this encounter  Medications   Bevacizumab (AVASTIN) SOLN 1.25 mg   Bevacizumab (AVASTIN) SOLN 1.25 mg      Return in about 4 weeks (around 03/23/2021) for f/u PDR OU, DFE, OCT.  There are no Patient Instructions on file for this visit.  This document serves as a record of services personally performed by Gardiner Sleeper, MD, PhD. It was created on their behalf by Leonie Douglas, an ophthalmic technician. The creation of this record is the provider's dictation and/or activities during the visit.    Electronically signed by: Leonie Douglas COA, 02/23/21  12:49 PM  This document serves as a record of services personally performed by Gardiner Sleeper, MD, PhD. It was created on their behalf by San Jetty. Owens Shark, OA an ophthalmic technician. The creation of this record is the provider's dictation and/or activities during the visit.    Electronically signed by: San Jetty. Marguerita Merles 12.02.2022 12:49 PM  Gardiner Sleeper, M.D., Ph.D. Diseases & Surgery of the Retina and [place] 02/23/2021  I have reviewed the above documentation for accuracy and completeness, and I agree with the above. Gardiner Sleeper, M.D., Ph.D. 02/23/21 12:53  PM   Abbreviations: M myopia (nearsighted); A astigmatism; H hyperopia (farsighted); P presbyopia; Mrx spectacle prescription;  CTL contact lenses; OD right eye; OS left eye; OU both eyes  XT exotropia; ET esotropia; PEK punctate epithelial keratitis; PEE punctate epithelial erosions; DES dry eye syndrome; MGD meibomian gland dysfunction; ATs artificial tears; PFAT's preservative free artificial tears; [place] nuclear sclerotic cataract; PSC posterior subcapsular cataract; ERM epi-retinal membrane; PVD posterior vitreous detachment; RD retinal detachment; DM diabetes mellitus; DR diabetic retinopathy; NPDR non-proliferative diabetic retinopathy; PDR proliferative diabetic retinopathy; CSME clinically significant macular edema; DME diabetic macular edema; dbh dot blot hemorrhages; CWS cotton wool spot; POAG primary open angle glaucoma; C/D cup-to-disc ratio; HVF humphrey visual field; GVF goldmann visual field; OCT optical coherence tomography; IOP intraocular pressure; BRVO Branch retinal vein occlusion; CRVO central retinal vein occlusion; CRAO central retinal artery occlusion; BRAO branch retinal artery occlusion; RT retinal tear; SB scleral buckle; PPV pars plana vitrectomy; VH Vitreous hemorrhage; PRP panretinal laser photocoagulation; IVK intravitreal kenalog; VMT vitreomacular traction; MH Macular hole;  NVD neovascularization of the disc; NVE neovascularization elsewhere; AREDS age related eye disease study; ARMD age related macular degeneration; POAG primary open angle glaucoma; EBMD epithelial/anterior basement membrane dystrophy; ACIOL anterior chamber intraocular lens; IOL intraocular lens; PCIOL posterior chamber intraocular lens; Phaco/IOL phacoemulsification with intraocular lens placement; [place] photorefractive keratectomy; LASIK laser assisted in situ keratomileusis; HTN hypertension; DM diabetes mellitus; COPD chronic obstructive pulmonary disease

## 2021-02-21 DIAGNOSIS — N185 Chronic kidney disease, stage 5: Secondary | ICD-10-CM | POA: Diagnosis not present

## 2021-02-21 DIAGNOSIS — N186 End stage renal disease: Secondary | ICD-10-CM | POA: Diagnosis not present

## 2021-02-21 DIAGNOSIS — E782 Mixed hyperlipidemia: Secondary | ICD-10-CM | POA: Diagnosis not present

## 2021-02-21 DIAGNOSIS — E1022 Type 1 diabetes mellitus with diabetic chronic kidney disease: Secondary | ICD-10-CM | POA: Diagnosis not present

## 2021-02-21 DIAGNOSIS — I120 Hypertensive chronic kidney disease with stage 5 chronic kidney disease or end stage renal disease: Secondary | ICD-10-CM | POA: Diagnosis not present

## 2021-02-21 DIAGNOSIS — Z992 Dependence on renal dialysis: Secondary | ICD-10-CM | POA: Diagnosis not present

## 2021-02-21 DIAGNOSIS — E1122 Type 2 diabetes mellitus with diabetic chronic kidney disease: Secondary | ICD-10-CM | POA: Diagnosis not present

## 2021-02-22 DIAGNOSIS — N2581 Secondary hyperparathyroidism of renal origin: Secondary | ICD-10-CM | POA: Diagnosis not present

## 2021-02-22 DIAGNOSIS — R52 Pain, unspecified: Secondary | ICD-10-CM | POA: Diagnosis not present

## 2021-02-22 DIAGNOSIS — Z992 Dependence on renal dialysis: Secondary | ICD-10-CM | POA: Diagnosis not present

## 2021-02-22 DIAGNOSIS — N186 End stage renal disease: Secondary | ICD-10-CM | POA: Diagnosis not present

## 2021-02-23 ENCOUNTER — Encounter (INDEPENDENT_AMBULATORY_CARE_PROVIDER_SITE_OTHER): Payer: Self-pay | Admitting: Ophthalmology

## 2021-02-23 ENCOUNTER — Ambulatory Visit (INDEPENDENT_AMBULATORY_CARE_PROVIDER_SITE_OTHER): Payer: Medicare Other | Admitting: Ophthalmology

## 2021-02-23 ENCOUNTER — Other Ambulatory Visit: Payer: Self-pay

## 2021-02-23 DIAGNOSIS — H25813 Combined forms of age-related cataract, bilateral: Secondary | ICD-10-CM

## 2021-02-23 DIAGNOSIS — H4313 Vitreous hemorrhage, bilateral: Secondary | ICD-10-CM | POA: Diagnosis not present

## 2021-02-23 DIAGNOSIS — E113513 Type 2 diabetes mellitus with proliferative diabetic retinopathy with macular edema, bilateral: Secondary | ICD-10-CM | POA: Diagnosis not present

## 2021-02-23 DIAGNOSIS — H3581 Retinal edema: Secondary | ICD-10-CM

## 2021-02-23 DIAGNOSIS — I10 Essential (primary) hypertension: Secondary | ICD-10-CM | POA: Diagnosis not present

## 2021-02-23 DIAGNOSIS — H35033 Hypertensive retinopathy, bilateral: Secondary | ICD-10-CM

## 2021-02-23 DIAGNOSIS — H4312 Vitreous hemorrhage, left eye: Secondary | ICD-10-CM

## 2021-02-23 DIAGNOSIS — H4311 Vitreous hemorrhage, right eye: Secondary | ICD-10-CM

## 2021-02-23 DIAGNOSIS — H43812 Vitreous degeneration, left eye: Secondary | ICD-10-CM

## 2021-02-23 MED ORDER — BEVACIZUMAB CHEMO INJECTION 1.25MG/0.05ML SYRINGE FOR KALEIDOSCOPE
1.2500 mg | INTRAVITREAL | Status: AC | PRN
  Administered 2021-02-23: 1.25 mg via INTRAVITREAL

## 2021-02-24 DIAGNOSIS — N2581 Secondary hyperparathyroidism of renal origin: Secondary | ICD-10-CM | POA: Diagnosis not present

## 2021-02-24 DIAGNOSIS — Z992 Dependence on renal dialysis: Secondary | ICD-10-CM | POA: Diagnosis not present

## 2021-02-24 DIAGNOSIS — R52 Pain, unspecified: Secondary | ICD-10-CM | POA: Diagnosis not present

## 2021-02-24 DIAGNOSIS — N186 End stage renal disease: Secondary | ICD-10-CM | POA: Diagnosis not present

## 2021-02-27 DIAGNOSIS — Z992 Dependence on renal dialysis: Secondary | ICD-10-CM | POA: Diagnosis not present

## 2021-02-27 DIAGNOSIS — N186 End stage renal disease: Secondary | ICD-10-CM | POA: Diagnosis not present

## 2021-02-27 DIAGNOSIS — N2581 Secondary hyperparathyroidism of renal origin: Secondary | ICD-10-CM | POA: Diagnosis not present

## 2021-02-27 DIAGNOSIS — R52 Pain, unspecified: Secondary | ICD-10-CM | POA: Diagnosis not present

## 2021-02-28 DIAGNOSIS — Z1152 Encounter for screening for COVID-19: Secondary | ICD-10-CM | POA: Diagnosis not present

## 2021-03-01 DIAGNOSIS — Z992 Dependence on renal dialysis: Secondary | ICD-10-CM | POA: Diagnosis not present

## 2021-03-01 DIAGNOSIS — N2581 Secondary hyperparathyroidism of renal origin: Secondary | ICD-10-CM | POA: Diagnosis not present

## 2021-03-01 DIAGNOSIS — R52 Pain, unspecified: Secondary | ICD-10-CM | POA: Diagnosis not present

## 2021-03-01 DIAGNOSIS — N186 End stage renal disease: Secondary | ICD-10-CM | POA: Diagnosis not present

## 2021-03-03 DIAGNOSIS — N186 End stage renal disease: Secondary | ICD-10-CM | POA: Diagnosis not present

## 2021-03-03 DIAGNOSIS — N2581 Secondary hyperparathyroidism of renal origin: Secondary | ICD-10-CM | POA: Diagnosis not present

## 2021-03-03 DIAGNOSIS — R52 Pain, unspecified: Secondary | ICD-10-CM | POA: Diagnosis not present

## 2021-03-03 DIAGNOSIS — Z992 Dependence on renal dialysis: Secondary | ICD-10-CM | POA: Diagnosis not present

## 2021-03-06 DIAGNOSIS — Z992 Dependence on renal dialysis: Secondary | ICD-10-CM | POA: Diagnosis not present

## 2021-03-06 DIAGNOSIS — N186 End stage renal disease: Secondary | ICD-10-CM | POA: Diagnosis not present

## 2021-03-06 DIAGNOSIS — R52 Pain, unspecified: Secondary | ICD-10-CM | POA: Diagnosis not present

## 2021-03-06 DIAGNOSIS — N2581 Secondary hyperparathyroidism of renal origin: Secondary | ICD-10-CM | POA: Diagnosis not present

## 2021-03-08 DIAGNOSIS — N186 End stage renal disease: Secondary | ICD-10-CM | POA: Diagnosis not present

## 2021-03-08 DIAGNOSIS — R52 Pain, unspecified: Secondary | ICD-10-CM | POA: Diagnosis not present

## 2021-03-08 DIAGNOSIS — N2581 Secondary hyperparathyroidism of renal origin: Secondary | ICD-10-CM | POA: Diagnosis not present

## 2021-03-08 DIAGNOSIS — Z992 Dependence on renal dialysis: Secondary | ICD-10-CM | POA: Diagnosis not present

## 2021-03-10 DIAGNOSIS — Z992 Dependence on renal dialysis: Secondary | ICD-10-CM | POA: Diagnosis not present

## 2021-03-10 DIAGNOSIS — N2581 Secondary hyperparathyroidism of renal origin: Secondary | ICD-10-CM | POA: Diagnosis not present

## 2021-03-10 DIAGNOSIS — R52 Pain, unspecified: Secondary | ICD-10-CM | POA: Diagnosis not present

## 2021-03-10 DIAGNOSIS — N186 End stage renal disease: Secondary | ICD-10-CM | POA: Diagnosis not present

## 2021-03-13 DIAGNOSIS — N186 End stage renal disease: Secondary | ICD-10-CM | POA: Diagnosis not present

## 2021-03-13 DIAGNOSIS — R52 Pain, unspecified: Secondary | ICD-10-CM | POA: Diagnosis not present

## 2021-03-13 DIAGNOSIS — N2581 Secondary hyperparathyroidism of renal origin: Secondary | ICD-10-CM | POA: Diagnosis not present

## 2021-03-13 DIAGNOSIS — Z992 Dependence on renal dialysis: Secondary | ICD-10-CM | POA: Diagnosis not present

## 2021-03-14 NOTE — Progress Notes (Signed)
[facility] Note  03/23/2021     CHIEF COMPLAINT Patient presents for Retina Follow Up    HISTORY OF PRESENT ILLNESS: Jonathan Mcneil is a 65 y.o. male who presents to the clinic today for:   HPI     Retina Follow Up   Patient presents with  Diabetic Retinopathy.  In both eyes.  Severity is moderate.  Duration of 4 weeks.  Since onset it is stable.  I, the attending physician,  performed the HPI with the patient and updated documentation appropriately.        Comments   Pt here for 4 wk ret f/u PDR OU. Pt reports he was hospitalized for elevated potassium levels on Christmas day. Pt reports he was not able to have his dialysis done last Saturday and ate potatoes that sent his potassium levels too high. He has recovered and reports his vision is stable, seems to be getting better. Does report some FOL OU that started recently, comes and goes.       Last edited by Bernarda Caffey, MD on 03/23/2021  1:00 PM.     Patient states he was unable to have dialysis due to nurse not able to find a vein.  In hospital during Christmas.  Had dialysis twice while there.  Potassium ended up high as well at 11.    Referring physician: Merrilee Seashore, MD [address],  [place] [address]  HISTORICAL INFORMATION:   Selected notes from the MEDICAL RECORD NUMBER Referred by Dr. Maryjane Hurter for heme OD   CURRENT MEDICATIONS: No current outpatient medications on file. (Ophthalmic Drugs)   No current facility-administered medications for this visit. (Ophthalmic Drugs)   Current Outpatient Medications (Other)  Medication Sig   acetaminophen (TYLENOL) 650 MG CR tablet Take 1,300 mg by mouth daily.   B Complex-C-Folic Acid (DIALYVITE PO) Take 1 tablet by mouth daily.   gabapentin (NEURONTIN) 300 MG capsule Take 300 mg by mouth at bedtime.   loperamide (IMODIUM A-D) 2 MG tablet Take 1 tablet (2 mg total) by mouth 4 (four) times daily as needed  for diarrhea or loose stools.   ondansetron (ZOFRAN) 8 MG tablet Take 8 mg by mouth every 8 (eight) hours as needed for vomiting or nausea.   sertraline (ZOLOFT) 25 MG tablet Take 25 mg by mouth every Monday, Wednesday, and Friday.   sucroferric oxyhydroxide (VELPHORO) 500 MG chewable tablet Chew 2,000 mg by mouth daily after breakfast.   No current facility-administered medications for this visit. (Other)   REVIEW OF SYSTEMS: ROS   Positive for: Genitourinary, Endocrine, Eyes, Psychiatric Negative for: Constitutional, Gastrointestinal, Neurological, Skin, Musculoskeletal, HENT, Cardiovascular, Respiratory, Allergic/Imm, Heme/Lymph Last edited by Kingsley Spittle, COT on 03/23/2021  9:30 AM.       ALLERGIES Allergies  Allergen Reactions   Ibuprofen Other (See Comments)    Unknown reaction Other reaction(s): hives   Penicillin G     Other reaction(s): hives   Penicillins Itching and Other (See Comments)    Has patient had a PCN reaction causing immediate rash, facial/tongue/throat swelling, SOB or lightheadedness with hypotension: No Has patient had a PCN reaction causing severe rash involving mucus membranes or skin necrosis: No Has patient had a PCN reaction that required hospitalization No Has patient had a PCN reaction occurring within the last 10 years: Unknown If all of the above answers are "NO", then may proceed with Cephalosporin use.   Venlafaxine  Other reaction(s): insomnia   PAST MEDICAL HISTORY Past Medical History:  Diagnosis Date   Cataract    Diabetes mellitus without complication ([place])    Diabetic retinopathy ([place])    ESRD (end stage renal disease) ([place])    GERD (gastroesophageal reflux disease)    PMH   Hypertension    Hypertensive retinopathy    Low back pain    Metabolic acidosis    Neuromuscular disorder ([place])    peripheral neuropathy   Pancreatitis    Schizophrenia ([place])    does not take medications   Past Surgical History:  Procedure  Laterality Date   A/V FISTULAGRAM Left 06/03/2018   Procedure: A/V FISTULAGRAM;  Surgeon: Marty Heck, MD;  Location: [facility];  Service: Cardiovascular;  Laterality: Left;   AV FISTULA PLACEMENT Left 02/11/2018   Procedure: INSERTION OF ARTERIOVENOUS (AV) FISTULA LEFT  ARM;  Surgeon: [name], MD;  Location: [facility];  Service: Vascular;  Laterality: Left;   [place] Left 04/17/2018   Procedure: BASILIC VEIN TRANSPOSITION SECOND STAGE;  Surgeon: [name], MD;  Location: [facility];  Service: Vascular;  Laterality: Left;   BIOPSY  10/20/2019   Procedure: BIOPSY;  Surgeon: Clarene Essex, MD;  Location: [facility];  Service: Endoscopy;;   COLONOSCOPY     DENTAL SURGERY     ESOPHAGOGASTRODUODENOSCOPY (EGD) WITH PROPOFOL N/A 10/20/2019   Procedure: ESOPHAGOGASTRODUODENOSCOPY (EGD) WITH PROPOFOL;  Surgeon: Clarene Essex, MD;  Location: [facility];  Service: Endoscopy;  Laterality: N/A;   INSERTION OF DIALYSIS CATHETER Right 02/25/2018   Procedure: INSERTION OF DIALYSIS CATHETER;  Surgeon: [name], MD;  Location: [facility];  Service: Vascular;  Laterality: Right;   PERIPHERAL VASCULAR BALLOON ANGIOPLASTY  06/03/2018   Procedure: PERIPHERAL VASCULAR BALLOON ANGIOPLASTY;  Surgeon: Marty Heck, MD;  Location: [facility];  Service: Cardiovascular;;  left a/v fistula   FAMILY HISTORY Family History  Problem Relation Age of Onset   Kidney failure Mother    Diabetes Father    Blindness Brother    SOCIAL HISTORY Social History   Tobacco Use   Smoking status: Former   Smokeless tobacco: Never  Scientific laboratory technician Use: Never used  Substance Use Topics   Alcohol use: No   Drug use: Not Currently    Types: Cocaine, Marijuana    Comment: smoked marijuana a few days ago; he denies using cocaine       OPHTHALMIC EXAM:  Base Eye Exam     Visual Acuity (Snellen - Linear)       Right Left   Dist [place] 20/30  +2 20/20 -2   Dist ph  NI          Tonometry (Tonopen, 9:35 AM)       Right Left   Pressure 19 18         Pupils       Dark Light Shape React APD   Right 3 2 Round Brisk None   Left 3 2 Round Brisk None         Visual Fields (Counting fingers)       Left Right    Full Full         Extraocular Movement       Right Left    Full, Ortho Full, Ortho         Neuro/Psych     Oriented x3: Yes   Mood/Affect: Normal  Dilation     Both eyes: 1.0% Mydriacyl, 2.5% Phenylephrine @ 9:36 AM           Slit Lamp and Fundus Exam     Slit Lamp Exam       Right Left   Lids/Lashes Dermatochalasis - upper lid Dermatochalasis - upper lid   Conjunctiva/Sclera Melanosis Melanosis, temporal pinguecula   Cornea 1+Punctate epithelial erosions, mild arcus, mild tear film debris 1+Punctate epithelial erosions, mild arcus   Anterior Chamber Deep and quiet Deep and quiet   Iris Round and dilated, No NVI Round and poorly dilated, No NVI   Lens 2-3+ Nuclear sclerosis, 2-3+ Cortical cataract 2-3+ Nuclear sclerosis, 2-3+ Cortical cataract   Anterior Vitreous Vitreous syneresis, diffuse VH- improved, residual blood stained vitreous condensations settled inferiorly Vitreous syneresis, blood stained vitreous condensations - improving, blood clots settling inferiorly         Fundus Exam       Right Left   Disc Mild Pallor, Sharp rim, focal PPP/PPA Mild Pallor, Sharp rim   C/D Ratio 0.4 0.6   Macula Flat, Good foveal reflex, RPE mottling and clumping, no edema, trace MA Flat, good foveal reflex, ERM, RPE mottling and clumping, scattered Microaneurysms/DBH greatest temporal macula   Vessels attenuated, mild Tortuousity, mild Copper wiring, nasal NV improved/regressing mild attenuation, mild Copper wiring, IN NV   Periphery Attached, 360 IRH/DBH - improved, pre-retinal heme settled inferiorly, red blood clots inferiorly turning white, 360 PRP with room for fill in.  Attached, scattered IRH/DBH greatest nasally, good 360 PRP changes, red blood clots inferiorly turning white, No RT/RD           IMAGING AND PROCEDURES  Imaging and Procedures for 03/23/2021  OCT, Retina - OU - Both Eyes       Right Eye Quality was good. Central Foveal Thickness: 243. Progression has improved. Findings include no IRF, no SRF, normal foveal contour, intraretinal hyper-reflective material (mild interval improvement in vitreous opacities).   Left Eye Quality was good. Central Foveal Thickness: 278. Progression has been stable. Findings include normal foveal contour, no SRF, intraretinal fluid (Stable improvement vitreous opacities; persistent cystic changes temporal macula, ERM with blunted foveal contour).   Notes *Images captured and stored on drive  Diagnosis / Impression:  OD: Mild interval improvement in vitreous opacities OS: Stable improvement vitreous opacities; persistent cystic changes temporal macula, ERM with blunted foveal contour  Clinical management:  See below  Abbreviations: NFP - Normal foveal profile. CME - cystoid macular edema. PED - pigment epithelial detachment. IRF - intraretinal fluid. SRF - subretinal fluid. EZ - ellipsoid zone. ERM - epiretinal membrane. ORA - outer retinal atrophy. ORT - outer retinal tubulation. SRHM - subretinal hyper-reflective material. IRHM - intraretinal hyper-reflective material      Intravitreal Injection, Pharmacologic Agent - OD - Right Eye       Time Out 03/23/2021. 10:38 AM. Confirmed correct patient, procedure, site, and patient consented.   Anesthesia Topical anesthesia was used. Anesthetic medications included Lidocaine 2%, Proparacaine 0.5%.   Procedure Preparation included 5% betadine to ocular surface, eyelid speculum. A supplied needle was used.   Injection: 1.25 mg Bevacizumab 1.25mg /0.30ml   Route: Intravitreal, Site: Right Eye   NDC: H061816, Lot: [id number] , Expiration date:  05/01/2021, Waste: 0 mL   Post-op Post injection exam found visual acuity of at least counting fingers. The patient tolerated the procedure well. There were no complications. The patient received written and verbal post procedure care education. Post injection medications were not  given.      Intravitreal Injection, Pharmacologic Agent - OS - Left Eye       Time Out 03/23/2021. 10:39 AM. Confirmed correct patient, procedure, site, and patient consented.   Anesthesia Topical anesthesia was used. Anesthetic medications included Lidocaine 2%, Proparacaine 0.5%.   Procedure Preparation included 5% betadine to ocular surface, eyelid speculum. A supplied needle was used.   Injection: 1.25 mg Bevacizumab 1.25mg /0.19ml   Route: Intravitreal, Site: Left Eye   NDC: H061816, Lot: [id number] , Expiration date: 04/21/2021, Waste: 0 mL   Post-op Post injection exam found visual acuity of at least counting fingers. The patient tolerated the procedure well. There were no complications. The patient received written and verbal post procedure care education. Post injection medications were not given.              ASSESSMENT/PLAN:   ICD-10-CM   1. Proliferative diabetic retinopathy of both eyes with macular edema associated with type 2 diabetes mellitus (HCC)  E11.3513 OCT, Retina - OU - Both Eyes    Intravitreal Injection, Pharmacologic Agent - OD - Right Eye    Intravitreal Injection, Pharmacologic Agent - OS - Left Eye    Bevacizumab (AVASTIN) SOLN 1.25 mg    Bevacizumab (AVASTIN) SOLN 1.25 mg    2. Vitreous hemorrhage of left eye (HCC)  H43.12 OCT, Retina - OU - Both Eyes    Intravitreal Injection, Pharmacologic Agent - OS - Left Eye    Bevacizumab (AVASTIN) SOLN 1.25 mg    3. Vitreous hemorrhage, right eye (HCC)  H43.11 OCT, Retina - OU - Both Eyes    Intravitreal Injection, Pharmacologic Agent - OD - Right Eye    Bevacizumab (AVASTIN) SOLN 1.25 mg    4. Posterior vitreous  detachment of left eye  H43.812     5. Essential hypertension  I10     6. Hypertensive retinopathy of both eyes  H35.033     7. Combined forms of age-related cataract of both eyes  H25.813        1-3. Proliferative diabetic retinopathy w/ recurrent VH OU (OD > OS) - s/p IVA OD #1 (12.08.21), #2 (01.11.22), #3 (02.09.22), #4 (11.04.22), #5 (12.02.22) - s/p IVA OS #1 (12.10.21), #2 (01.11.22), #3 (02.09.22), #4 (09.09.22), #5 (10.07.22), #6 (11.04.22), #7 (12.02.22) - FA (12.08.21) shows late-leaking MA, vascular nonperfusion, +NV OU (nasal midzone) - s/p PRP OD (01.26.22) - s/p PRP OS (03.09.22) - BCVA 20/30 OD, 20/20 OS (stable OU) - exam shows interval improvement in VH OD; OS with blood stained vit condensations improving from hemorrhagic PVD, and blood clots settling inferiorly  - OCT shows OD: mild interval improvement in vitreous opacities; OS: stable improvement in vitreous opacities; persistent cystic changes temporal macula, ERM with blunted foveal contour  - repeat FA 6.6.22 shows interval improvement in NVE/leakage OU s/p PRP OU-- just minimal leakage remains   - recommend holding heparin at dialysis if able--gave note for patient to take to dialysis on 12/23/2020  - recommend IVA OU (OD #6 and OS #8) today, 12.30.22 for [facility]  - pt wishes to proceed  - RBA of procedure discussed, questions answered - informed consent obtained and signed - see procedure note - f/u 4 weeks, DFE, OCT, possible injections  4. Hemorrhagic PVD OS  - Onset mid-August 2022 w/ new onset floaters, no photopsias  - s/p IVA OS #4 (09.09.22), #5 (10.07.22), #6 (11.04.22), #7 (12.02.22) - today, VA stably improved to 20/20 OS from 20/40 and 20/80 prior - pt states  they have been holding heparin at dialysis - exam shows interval improvement in vitreous heme -- clearing and settling inferiorly - pt reports previously receiving heparin during dialysis (T, Th, Sat) -- may have contributed to interval  worsening  - Discussed findings and prognosis  - No RT or RD on 360 peripheral exam  - Reviewed s/s of RT/RD  - Strict return precautions for any such RT/RD signs/symptoms  - VH precautions reviewed -- minimize activities, keep head elevated, hold heparin if able, avoid ASA/NSAIDS  - recommend IVA OS #8 today, 12.30.22  - pt wishes to proceed  - RBA of procedure discussed, questions answered - informed consent obtained and signed - see procedure note  - F/U 4 week  5,6. Hypertensive retinopathy OU - discussed importance of tight BP control - monitor   7. Mixed Cataract OU - The symptoms of cataract, surgical options, and treatments and risks were discussed with patient - discussed diagnosis and progression - approaching visual significance - monitor for now  Ophthalmic Meds Ordered this visit:  Meds ordered this encounter  Medications   Bevacizumab (AVASTIN) SOLN 1.25 mg   Bevacizumab (AVASTIN) SOLN 1.25 mg     Return in about 4 weeks (around 04/20/2021) for DFE, OCT, possible injection.  There are no Patient Instructions on file for this visit.  This document serves as a record of services personally performed by Gardiner Sleeper, MD, PhD. It was created on their behalf by Leonie Douglas, an ophthalmic technician. The creation of this record is the provider's dictation and/or activities during the visit.    Electronically signed by: Leonie Douglas COA, 03/23/21  1:05 PM   Gardiner Sleeper, M.D., Ph.D. Diseases & Surgery of the Retina and [place] 03/23/2021  I have reviewed the above documentation for accuracy and completeness, and I agree with the above. Gardiner Sleeper, M.D., Ph.D. 03/23/21 1:05 PM   Abbreviations: M myopia (nearsighted); A astigmatism; H hyperopia (farsighted); P presbyopia; Mrx spectacle prescription;  CTL contact lenses; OD right eye; OS left eye; OU both eyes  XT exotropia; ET esotropia; PEK punctate epithelial  keratitis; PEE punctate epithelial erosions; DES dry eye syndrome; MGD meibomian gland dysfunction; ATs artificial tears; PFAT's preservative free artificial tears; [place] nuclear sclerotic cataract; PSC posterior subcapsular cataract; ERM epi-retinal membrane; PVD posterior vitreous detachment; RD retinal detachment; DM diabetes mellitus; DR diabetic retinopathy; NPDR non-proliferative diabetic retinopathy; PDR proliferative diabetic retinopathy; CSME clinically significant macular edema; DME diabetic macular edema; dbh dot blot hemorrhages; CWS cotton wool spot; POAG primary open angle glaucoma; C/D cup-to-disc ratio; HVF humphrey visual field; GVF goldmann visual field; OCT optical coherence tomography; IOP intraocular pressure; BRVO Branch retinal vein occlusion; CRVO central retinal vein occlusion; CRAO central retinal artery occlusion; BRAO branch retinal artery occlusion; RT retinal tear; SB scleral buckle; PPV pars plana vitrectomy; VH Vitreous hemorrhage; PRP panretinal laser photocoagulation; IVK intravitreal kenalog; VMT vitreomacular traction; MH Macular hole;  NVD neovascularization of the disc; NVE neovascularization elsewhere; AREDS age related eye disease study; ARMD age related macular degeneration; POAG primary open angle glaucoma; EBMD epithelial/anterior basement membrane dystrophy; ACIOL anterior chamber intraocular lens; IOL intraocular lens; PCIOL posterior chamber intraocular lens; Phaco/IOL phacoemulsification with intraocular lens placement; [place] photorefractive keratectomy; LASIK laser assisted in situ keratomileusis; HTN hypertension; DM diabetes mellitus; COPD chronic obstructive pulmonary disease

## 2021-03-15 DIAGNOSIS — N186 End stage renal disease: Secondary | ICD-10-CM | POA: Diagnosis not present

## 2021-03-15 DIAGNOSIS — Z992 Dependence on renal dialysis: Secondary | ICD-10-CM | POA: Diagnosis not present

## 2021-03-15 DIAGNOSIS — N2581 Secondary hyperparathyroidism of renal origin: Secondary | ICD-10-CM | POA: Diagnosis not present

## 2021-03-15 DIAGNOSIS — R52 Pain, unspecified: Secondary | ICD-10-CM | POA: Diagnosis not present

## 2021-03-17 DIAGNOSIS — N2581 Secondary hyperparathyroidism of renal origin: Secondary | ICD-10-CM | POA: Diagnosis not present

## 2021-03-17 DIAGNOSIS — Z992 Dependence on renal dialysis: Secondary | ICD-10-CM | POA: Diagnosis not present

## 2021-03-17 DIAGNOSIS — R52 Pain, unspecified: Secondary | ICD-10-CM | POA: Diagnosis not present

## 2021-03-17 DIAGNOSIS — N186 End stage renal disease: Secondary | ICD-10-CM | POA: Diagnosis not present

## 2021-03-18 ENCOUNTER — Inpatient Hospital Stay (HOSPITAL_COMMUNITY)
Admission: EM | Admit: 2021-03-18 | Discharge: 2021-03-20 | DRG: 917 | Disposition: A | Discharge status: Home / Self Care | Payer: Medicare Other | Attending: Internal Medicine | Admitting: Internal Medicine

## 2021-03-18 ENCOUNTER — Encounter (HOSPITAL_COMMUNITY): Payer: Self-pay | Admitting: Emergency Medicine

## 2021-03-18 ENCOUNTER — Other Ambulatory Visit: Payer: Self-pay

## 2021-03-18 DIAGNOSIS — E669 Obesity, unspecified: Secondary | ICD-10-CM | POA: Diagnosis present

## 2021-03-18 DIAGNOSIS — T40711A Poisoning by cannabis, accidental (unintentional), initial encounter: Principal | ICD-10-CM | POA: Diagnosis present

## 2021-03-18 DIAGNOSIS — A084 Viral intestinal infection, unspecified: Secondary | ICD-10-CM | POA: Diagnosis not present

## 2021-03-18 DIAGNOSIS — E1122 Type 2 diabetes mellitus with diabetic chronic kidney disease: Secondary | ICD-10-CM | POA: Diagnosis present

## 2021-03-18 DIAGNOSIS — I5033 Acute on chronic diastolic (congestive) heart failure: Secondary | ICD-10-CM | POA: Diagnosis present

## 2021-03-18 DIAGNOSIS — K529 Noninfective gastroenteritis and colitis, unspecified: Secondary | ICD-10-CM | POA: Diagnosis not present

## 2021-03-18 DIAGNOSIS — Z992 Dependence on renal dialysis: Secondary | ICD-10-CM | POA: Diagnosis not present

## 2021-03-18 DIAGNOSIS — N186 End stage renal disease: Secondary | ICD-10-CM | POA: Diagnosis present

## 2021-03-18 DIAGNOSIS — I120 Hypertensive chronic kidney disease with stage 5 chronic kidney disease or end stage renal disease: Secondary | ICD-10-CM | POA: Diagnosis not present

## 2021-03-18 DIAGNOSIS — E66811 Obesity, class 1: Secondary | ICD-10-CM | POA: Diagnosis present

## 2021-03-18 DIAGNOSIS — F32A Depression, unspecified: Secondary | ICD-10-CM | POA: Diagnosis not present

## 2021-03-18 DIAGNOSIS — R112 Nausea with vomiting, unspecified: Secondary | ICD-10-CM | POA: Diagnosis present

## 2021-03-18 DIAGNOSIS — R1111 Vomiting without nausea: Secondary | ICD-10-CM | POA: Diagnosis not present

## 2021-03-18 DIAGNOSIS — K521 Toxic gastroenteritis and colitis: Secondary | ICD-10-CM | POA: Diagnosis present

## 2021-03-18 DIAGNOSIS — I10 Essential (primary) hypertension: Secondary | ICD-10-CM | POA: Diagnosis not present

## 2021-03-18 DIAGNOSIS — R109 Unspecified abdominal pain: Secondary | ICD-10-CM | POA: Diagnosis not present

## 2021-03-18 DIAGNOSIS — Z888 Allergy status to other drugs, medicaments and biological substances status: Secondary | ICD-10-CM

## 2021-03-18 DIAGNOSIS — Y92009 Unspecified place in unspecified non-institutional (private) residence as the place of occurrence of the external cause: Secondary | ICD-10-CM | POA: Diagnosis not present

## 2021-03-18 DIAGNOSIS — Z20822 Contact with and (suspected) exposure to covid-19: Secondary | ICD-10-CM | POA: Diagnosis present

## 2021-03-18 DIAGNOSIS — Z880 Allergy status to penicillin: Secondary | ICD-10-CM

## 2021-03-18 DIAGNOSIS — Z833 Family history of diabetes mellitus: Secondary | ICD-10-CM | POA: Diagnosis not present

## 2021-03-18 DIAGNOSIS — F1210 Cannabis abuse, uncomplicated: Secondary | ICD-10-CM | POA: Diagnosis present

## 2021-03-18 DIAGNOSIS — R1011 Right upper quadrant pain: Secondary | ICD-10-CM

## 2021-03-18 DIAGNOSIS — E1142 Type 2 diabetes mellitus with diabetic polyneuropathy: Secondary | ICD-10-CM | POA: Diagnosis present

## 2021-03-18 DIAGNOSIS — Z6831 Body mass index (BMI) 31.0-31.9, adult: Secondary | ICD-10-CM

## 2021-03-18 DIAGNOSIS — E875 Hyperkalemia: Secondary | ICD-10-CM | POA: Diagnosis not present

## 2021-03-18 DIAGNOSIS — R1084 Generalized abdominal pain: Secondary | ICD-10-CM | POA: Diagnosis not present

## 2021-03-18 DIAGNOSIS — Z841 Family history of disorders of kidney and ureter: Secondary | ICD-10-CM

## 2021-03-18 DIAGNOSIS — Z87891 Personal history of nicotine dependence: Secondary | ICD-10-CM

## 2021-03-18 DIAGNOSIS — I132 Hypertensive heart and chronic kidney disease with heart failure and with stage 5 chronic kidney disease, or end stage renal disease: Secondary | ICD-10-CM | POA: Diagnosis present

## 2021-03-18 DIAGNOSIS — D631 Anemia in chronic kidney disease: Secondary | ICD-10-CM | POA: Diagnosis present

## 2021-03-18 DIAGNOSIS — N261 Atrophy of kidney (terminal): Secondary | ICD-10-CM | POA: Diagnosis not present

## 2021-03-18 DIAGNOSIS — R9431 Abnormal electrocardiogram [ECG] [EKG]: Secondary | ICD-10-CM | POA: Diagnosis not present

## 2021-03-18 DIAGNOSIS — J9601 Acute respiratory failure with hypoxia: Secondary | ICD-10-CM | POA: Diagnosis not present

## 2021-03-18 DIAGNOSIS — Z1810 Retained metal fragments, unspecified: Secondary | ICD-10-CM | POA: Diagnosis not present

## 2021-03-18 DIAGNOSIS — N2581 Secondary hyperparathyroidism of renal origin: Secondary | ICD-10-CM | POA: Diagnosis not present

## 2021-03-18 DIAGNOSIS — Z821 Family history of blindness and visual loss: Secondary | ICD-10-CM

## 2021-03-18 DIAGNOSIS — R197 Diarrhea, unspecified: Secondary | ICD-10-CM

## 2021-03-18 DIAGNOSIS — K219 Gastro-esophageal reflux disease without esophagitis: Secondary | ICD-10-CM | POA: Diagnosis not present

## 2021-03-18 DIAGNOSIS — I700 Atherosclerosis of aorta: Secondary | ICD-10-CM | POA: Diagnosis not present

## 2021-03-18 DIAGNOSIS — R188 Other ascites: Secondary | ICD-10-CM | POA: Diagnosis not present

## 2021-03-18 DIAGNOSIS — J90 Pleural effusion, not elsewhere classified: Secondary | ICD-10-CM | POA: Diagnosis not present

## 2021-03-18 DIAGNOSIS — R110 Nausea: Secondary | ICD-10-CM | POA: Diagnosis not present

## 2021-03-18 LAB — CBC WITH DIFFERENTIAL/PLATELET
Abs Immature Granulocytes: 0.04 10*3/uL (ref 0.00–0.07)
Basophils Absolute: 0.1 10*3/uL (ref 0.0–0.1)
Basophils Relative: 1 %
Eosinophils Absolute: 0.2 10*3/uL (ref 0.0–0.5)
Eosinophils Relative: 3 %
HCT: 41 % (ref 39.0–52.0)
Hemoglobin: 13.1 g/dL (ref 13.0–17.0)
Immature Granulocytes: 1 %
Lymphocytes Relative: 16 %
Lymphs Abs: 1.2 10*3/uL (ref 0.7–4.0)
MCH: 34.7 pg — ABNORMAL HIGH (ref 26.0–34.0)
MCHC: 32 g/dL (ref 30.0–36.0)
MCV: 108.5 fL — ABNORMAL HIGH (ref 80.0–100.0)
Monocytes Absolute: 0.6 10*3/uL (ref 0.1–1.0)
Monocytes Relative: 7 %
Neutro Abs: 5.7 10*3/uL (ref 1.7–7.7)
Neutrophils Relative %: 72 %
Platelets: 154 10*3/uL (ref 150–400)
RBC: 3.78 MIL/uL — ABNORMAL LOW (ref 4.22–5.81)
RDW: 15.6 % — ABNORMAL HIGH (ref 11.5–15.5)
WBC: 7.7 10*3/uL (ref 4.0–10.5)
nRBC: 0.5 % — ABNORMAL HIGH (ref 0.0–0.2)

## 2021-03-18 LAB — I-STAT VENOUS BLOOD GAS, ED
Acid-Base Excess: 1 mmol/L (ref 0.0–2.0)
Bicarbonate: 25.1 mmol/L (ref 20.0–28.0)
Calcium, Ion: 0.97 mmol/L — ABNORMAL LOW (ref 1.15–1.40)
HCT: 40 % (ref 39.0–52.0)
Hemoglobin: 13.6 g/dL (ref 13.0–17.0)
O2 Saturation: 75 %
Potassium: 8.1 mmol/L (ref 3.5–5.1)
Sodium: 134 mmol/L — ABNORMAL LOW (ref 135–145)
TCO2: 26 mmol/L (ref 22–32)
pCO2, Ven: 39.1 mmHg — ABNORMAL LOW (ref 44.0–60.0)
pH, Ven: 7.415 (ref 7.250–7.430)
pO2, Ven: 39 mmHg (ref 32.0–45.0)

## 2021-03-18 LAB — COMPREHENSIVE METABOLIC PANEL
ALT: 21 U/L (ref 0–44)
AST: 22 U/L (ref 15–41)
Albumin: 3.9 g/dL (ref 3.5–5.0)
Alkaline Phosphatase: 110 U/L (ref 38–126)
Anion gap: 16 — ABNORMAL HIGH (ref 5–15)
BUN: 79 mg/dL — ABNORMAL HIGH (ref 8–23)
CO2: 21 mmol/L — ABNORMAL LOW (ref 22–32)
Calcium: 9.7 mg/dL (ref 8.9–10.3)
Chloride: 99 mmol/L (ref 98–111)
Creatinine, Ser: 13.23 mg/dL — ABNORMAL HIGH (ref 0.61–1.24)
GFR, Estimated: 4 mL/min — ABNORMAL LOW (ref >60)
Glucose, Bld: 135 mg/dL — ABNORMAL HIGH (ref 70–99)
Potassium: >7.5 mmol/L (ref 3.5–5.1)
Sodium: 136 mmol/L (ref 135–145)
Total Bilirubin: 1.2 mg/dL (ref 0.3–1.2)
Total Protein: 7.8 g/dL (ref 6.5–8.1)

## 2021-03-18 LAB — I-STAT CHEM 8, ED
BUN: 88 mg/dL — ABNORMAL HIGH (ref 8–23)
Calcium, Ion: 1.05 mmol/L — ABNORMAL LOW (ref 1.15–1.40)
Chloride: 102 mmol/L (ref 98–111)
Creatinine, Ser: 13.5 mg/dL — ABNORMAL HIGH (ref 0.61–1.24)
Glucose, Bld: 125 mg/dL — ABNORMAL HIGH (ref 70–99)
HCT: 43 % (ref 39.0–52.0)
Hemoglobin: 14.6 g/dL (ref 13.0–17.0)
Potassium: 8.1 mmol/L (ref 3.5–5.1)
Sodium: 135 mmol/L (ref 135–145)
TCO2: 26 mmol/L (ref 22–32)

## 2021-03-18 LAB — RESP PANEL BY RT-PCR (FLU A&B, COVID) ARPGX2
Influenza A by PCR: NEGATIVE
Influenza B by PCR: NEGATIVE
SARS Coronavirus 2 by RT PCR: NEGATIVE

## 2021-03-18 LAB — MAGNESIUM: Magnesium: 2.8 mg/dL — ABNORMAL HIGH (ref 1.7–2.4)

## 2021-03-18 LAB — PHOSPHORUS: Phosphorus: 7.5 mg/dL — ABNORMAL HIGH (ref 2.5–4.6)

## 2021-03-18 MED ORDER — SODIUM BICARBONATE 8.4 % IV SOLN
50.0000 meq | Freq: Once | INTRAVENOUS | Status: AC
  Administered 2021-03-18: 18:00:00 50 meq via INTRAVENOUS
  Filled 2021-03-18: qty 50

## 2021-03-18 MED ORDER — SODIUM BICARBONATE 8.4 % IV SOLN
50.0000 meq | Freq: Once | INTRAVENOUS | Status: AC
  Administered 2021-03-18: 20:00:00 50 meq via INTRAVENOUS
  Filled 2021-03-18: qty 50

## 2021-03-18 MED ORDER — ONDANSETRON HCL 4 MG/2ML IJ SOLN
INTRAMUSCULAR | Status: AC
  Administered 2021-03-18: 20:00:00 4 mg via INTRAVENOUS
  Filled 2021-03-18: qty 2

## 2021-03-18 MED ORDER — CALCIUM GLUCONATE 10 % IV SOLN
1.0000 g | Freq: Once | INTRAVENOUS | Status: AC
  Administered 2021-03-18: 18:00:00 1 g via INTRAVENOUS
  Filled 2021-03-18: qty 10

## 2021-03-18 MED ORDER — ONDANSETRON HCL 4 MG/2ML IJ SOLN
4.0000 mg | Freq: Once | INTRAMUSCULAR | Status: AC
  Administered 2021-03-18: 18:00:00 4 mg via INTRAVENOUS
  Filled 2021-03-18: qty 2

## 2021-03-18 MED ORDER — CHLORHEXIDINE GLUCONATE CLOTH 2 % EX PADS
6.0000 | MEDICATED_PAD | Freq: Every day | CUTANEOUS | Status: DC
  Administered 2021-03-20: 06:00:00 6 via TOPICAL

## 2021-03-18 MED ORDER — DEXTROSE 50 % IV SOLN
1.0000 | Freq: Once | INTRAVENOUS | Status: AC
  Administered 2021-03-18: 20:00:00 50 mL via INTRAVENOUS
  Filled 2021-03-18: qty 50

## 2021-03-18 MED ORDER — INSULIN ASPART 100 UNIT/ML IV SOLN
5.0000 [IU] | Freq: Once | INTRAVENOUS | Status: AC
  Administered 2021-03-18: 20:00:00 5 [IU] via INTRAVENOUS

## 2021-03-18 MED ORDER — CALCIUM GLUCONATE 10 % IV SOLN
1.0000 g | Freq: Once | INTRAVENOUS | Status: AC
  Administered 2021-03-18: 20:00:00 1 g via INTRAVENOUS
  Filled 2021-03-18: qty 10

## 2021-03-18 MED ORDER — MIDAZOLAM HCL 2 MG/2ML IJ SOLN
1.0000 mg | Freq: Once | INTRAMUSCULAR | Status: AC
  Administered 2021-03-18: 20:00:00 1 mg via INTRAVENOUS
  Filled 2021-03-18: qty 2

## 2021-03-18 MED ORDER — SODIUM ZIRCONIUM CYCLOSILICATE 10 G PO PACK
10.0000 g | PACK | Freq: Once | ORAL | Status: AC
  Administered 2021-03-18: 20:00:00 10 g via ORAL
  Filled 2021-03-18: qty 1

## 2021-03-18 MED ORDER — ONDANSETRON HCL 4 MG/2ML IJ SOLN: 4.0000 mg | Freq: Once | INTRAMUSCULAR | Status: AC

## 2021-03-18 NOTE — ED Notes (Signed)
Pt oxygen saturation 88% on room air. Placed on 2L nasal cannula

## 2021-03-18 NOTE — ED Notes (Signed)
Pt requesting to use the restroom and provided with a bedside commode. Pt refused to use the bedside commode and insisted on ambulating to the restroom. Pt ambulated to the restroom with assistance.

## 2021-03-18 NOTE — Consult Note (Signed)
[facility]  INPATIENT CONSULTATION  Reason for Consultation:  hyperkalemia Requesting Provider: Dr. Kathrynn Humble  HPI: Jonathan Mcneil is an 65 y.o. male with ESRD on HD TTS [place] [facility], DM, HTN, HL, schizophrenia, h/o pancreatitis who is seen for eval and management of hyperkalemia.   Went for usual HD yesterday but issue accessing AVF so he received no dialysis.  Was told to return Monday for next usual dialysis.  In meantime developed nausea, vomiting, diarrhea and weakness.  Presented to ED today where he was found to have a K > 7.5 and some brief periods of junctional escape bradycardia.  Afebrile, hypertensive, WBC 7.7, Hb 14.6.   On eval he has ongoing vomiting and abdominal pain since missed HD.  Has some chronic issues with same, but this is worse.   Other than inability to cannulate AVF yesterday no recent AVF issues.   PMH: Past Medical History:  Diagnosis Date   Cataract    Diabetes mellitus without complication ([place])    Diabetic retinopathy ([place])    ESRD (end stage renal disease) ([place])    GERD (gastroesophageal reflux disease)    PMH   Hypertension    Hypertensive retinopathy    Low back pain    Metabolic acidosis    Neuromuscular disorder ([place])    peripheral neuropathy   Pancreatitis    Schizophrenia ([place])    does not take medications   PSH: Past Surgical History:  Procedure Laterality Date   A/V FISTULAGRAM Left 06/03/2018   Procedure: A/V FISTULAGRAM;  Surgeon: Jonathan Heck, MD;  Location: [facility];  Service: Cardiovascular;  Laterality: Left;   AV FISTULA PLACEMENT Left 02/11/2018   Procedure: INSERTION OF ARTERIOVENOUS (AV) FISTULA LEFT  ARM;  Surgeon: [name], MD;  Location: [facility];  Service: Vascular;  Laterality: Left;   [place] Left 04/17/2018   Procedure: BASILIC VEIN TRANSPOSITION SECOND STAGE;  Surgeon: [name], MD;  Location: [facility];  Service: Vascular;  Laterality:  Left;   BIOPSY  10/20/2019   Procedure: BIOPSY;  Surgeon: Clarene Essex, MD;  Location: [facility];  Service: Endoscopy;;   COLONOSCOPY     DENTAL SURGERY     ESOPHAGOGASTRODUODENOSCOPY (EGD) WITH PROPOFOL N/A 10/20/2019   Procedure: ESOPHAGOGASTRODUODENOSCOPY (EGD) WITH PROPOFOL;  Surgeon: Clarene Essex, MD;  Location: [facility];  Service: Endoscopy;  Laterality: N/A;   INSERTION OF DIALYSIS CATHETER Right 02/25/2018   Procedure: INSERTION OF DIALYSIS CATHETER;  Surgeon: [name], MD;  Location: [facility];  Service: Vascular;  Laterality: Right;   PERIPHERAL VASCULAR BALLOON ANGIOPLASTY  06/03/2018   Procedure: PERIPHERAL VASCULAR BALLOON ANGIOPLASTY;  Surgeon: Jonathan Heck, MD;  Location: [facility];  Service: Cardiovascular;;  left a/v fistula   Past Medical History:  Diagnosis Date   Cataract    Diabetes mellitus without complication ([place])    Diabetic retinopathy ([place])    ESRD (end stage renal disease) ([place])    GERD (gastroesophageal reflux disease)    PMH   Hypertension    Hypertensive retinopathy    Low back pain    Metabolic acidosis    Neuromuscular disorder ([place])    peripheral neuropathy   Pancreatitis    Schizophrenia ([place])    does not take medications    Medications:  I have reviewed the patient's current medications.  (Not in a hospital admission)   ALLERGIES:   Allergies  Allergen Reactions   Ibuprofen Other (See Comments)  Unknown reaction Other reaction(s): hives   Penicillin G     Other reaction(s): hives   Penicillins Itching and Other (See Comments)    Has patient had a PCN reaction causing immediate rash, facial/tongue/throat swelling, SOB or lightheadedness with hypotension: No Has patient had a PCN reaction causing severe rash involving mucus membranes or skin necrosis: No Has patient had a PCN reaction that required hospitalization No Has patient had a PCN reaction occurring within the last 10 years: Unknown If all  of the above answers are "NO", then may proceed with Cephalosporin use.   Venlafaxine     Other reaction(s): insomnia    FAM HX: Family History  Problem Relation Age of Onset   Kidney failure Mother    Diabetes Father    Blindness Brother     Social History:   reports that he has quit smoking. He has never used smokeless tobacco. He reports that he does not currently use drugs after having used the following drugs: Cocaine and Marijuana. He reports that he does not drink alcohol.  ROS: 12 system ROS neg except per HPI  Blood pressure (!) 167/89, pulse 63, temperature 97.6 F (36.4 C), temperature source Oral, resp. rate 18, height 5\' 9"  (1.753 m), weight 95.3 kg, SpO2 97 %. PHYSICAL EXAM: Gen: uncomfortable but nontoxic appearing  Eyes: anicteric ENT: MMM Neck:supple CV: RRR, SR on monitor, no rub Abd:  soft, obese, nontender with palpation Lungs: normal WOB, clear GU: no foley Extr:  no edema, LUE AVF +t/b - short cannulation zone Neuro:conversant, grossly nonfocal Skin: no visible rashes   Results for orders placed or performed during the hospital encounter of 03/18/21 (from the past 48 hour(s))  CBC with Differential     Status: Abnormal   Collection Time: 03/18/21  4:47 PM  Result Value Ref Range   WBC 7.7 4.0 - 10.5 K/uL   RBC 3.78 (L) 4.22 - 5.81 MIL/uL   Hemoglobin 13.1 13.0 - 17.0 g/dL   HCT 41.0 39.0 - 52.0 %   MCV 108.5 (H) 80.0 - 100.0 fL   MCH 34.7 (H) 26.0 - 34.0 pg   MCHC 32.0 30.0 - 36.0 g/dL   RDW 15.6 (H) 11.5 - 15.5 %   Platelets 154 150 - 400 K/uL   nRBC 0.5 (H) 0.0 - 0.2 %   Neutrophils Relative % 72 %   Neutro Abs 5.7 1.7 - 7.7 K/uL   Lymphocytes Relative 16 %   Lymphs Abs 1.2 0.7 - 4.0 K/uL   Monocytes Relative 7 %   Monocytes Absolute 0.6 0.1 - 1.0 K/uL   Eosinophils Relative 3 %   Eosinophils Absolute 0.2 0.0 - 0.5 K/uL   Basophils Relative 1 %   Basophils Absolute 0.1 0.0 - 0.1 K/uL   Immature Granulocytes 1 %   Abs Immature  Granulocytes 0.04 0.00 - 0.07 K/uL    Comment: Performed at [facility], [address], [place], [place] [address]  I-Stat venous blood gas, ED     Status: Abnormal   Collection Time: 03/18/21  5:45 PM  Result Value Ref Range   pH, Ven 7.415 7.250 - 7.430   pCO2, Ven 39.1 (L) 44.0 - 60.0 mmHg   pO2, Ven 39.0 32.0 - 45.0 mmHg   Bicarbonate 25.1 20.0 - 28.0 mmol/L   TCO2 26 22 - 32 mmol/L   O2 Saturation 75.0 %   Acid-Base Excess 1.0 0.0 - 2.0 mmol/L   Sodium 134 (L) 135 - 145  mmol/L   Potassium 8.1 (HH) 3.5 - 5.1 mmol/L   Calcium, Ion 0.97 (L) 1.15 - 1.40 mmol/L   HCT 40.0 39.0 - 52.0 %   Hemoglobin 13.6 13.0 - 17.0 g/dL   Sample type VENOUS    Comment NOTIFIED PHYSICIAN   Comprehensive metabolic panel     Status: Abnormal   Collection Time: 03/18/21  7:02 PM  Result Value Ref Range   Sodium 136 135 - 145 mmol/L   Potassium >7.5 (HH) 3.5 - 5.1 mmol/L    Comment: SLIGHT HEMOLYSIS CRITICAL RESULT CALLED TO, READ BACK BY AND VERIFIED WITH: S WIRTZ RN BY SSTEPHENS [id number]    Chloride 99 98 - 111 mmol/L   CO2 21 (L) 22 - 32 mmol/L   Glucose, Bld 135 (H) 70 - 99 mg/dL    Comment: Glucose reference range applies only to samples taken after fasting for at least 8 hours.   BUN 79 (H) 8 - 23 mg/dL   Creatinine, Ser 13.23 (H) 0.61 - 1.24 mg/dL   Calcium 9.7 8.9 - 10.3 mg/dL   Total Protein 7.8 6.5 - 8.1 g/dL   Albumin 3.9 3.5 - 5.0 g/dL   AST 22 15 - 41 U/L   ALT 21 0 - 44 U/L   Alkaline Phosphatase 110 38 - 126 U/L   Total Bilirubin 1.2 0.3 - 1.2 mg/dL   GFR, Estimated 4 (L) >60 mL/min    Comment: (NOTE) Calculated using the CKD-EPI Creatinine Equation (2021)    Anion gap 16 (H) 5 - 15    Comment: Performed at [facility], [place] [address] [place], [place] [address]  Magnesium     Status: Abnormal   Collection Time: 03/18/21  7:02 PM  Result Value Ref Range   Magnesium 2.8 (H) 1.7 - 2.4 mg/dL    Comment: Performed at [facility] [address]  [place], [place] [address]  Phosphorus     Status: Abnormal   Collection Time: 03/18/21  7:02 PM  Result Value Ref Range   Phosphorus 7.5 (H) 2.5 - 4.6 mg/dL    Comment: Performed at [facility] [address], [place], [place] [address]  I-stat chem 8, ED (not at [facility] or [facility])     Status: Abnormal   Collection Time: 03/18/21  7:27 PM  Result Value Ref Range   Sodium 135 135 - 145 mmol/L   Potassium 8.1 (HH) 3.5 - 5.1 mmol/L   Chloride 102 98 - 111 mmol/L   BUN 88 (H) 8 - 23 mg/dL   Creatinine, Ser 13.50 (H) 0.61 - 1.24 mg/dL   Glucose, Bld 125 (H) 70 - 99 mg/dL    Comment: Glucose reference range applies only to samples taken after fasting for at least 8 hours.   Calcium, Ion 1.05 (L) 1.15 - 1.40 mmol/L   TCO2 26 22 - 32 mmol/L   Hemoglobin 14.6 13.0 - 17.0 g/dL   HCT 43.0 39.0 - 52.0 %   Comment NOTIFIED PHYSICIAN   Resp Panel by RT-PCR (Flu A&B, Covid) Nasopharyngeal Swab     Status: None   Collection Time: 03/18/21  8:15 PM   Specimen: Nasopharyngeal Swab; Nasopharyngeal(NP) swabs in vial transport medium  Result Value Ref Range   SARS Coronavirus 2 by RT PCR NEGATIVE NEGATIVE    Comment: (NOTE) SARS-CoV-2 target nucleic acids are NOT DETECTED.  The SARS-CoV-2 RNA is generally detectable in upper respiratory specimens during the acute phase of infection. The  lowest concentration of SARS-CoV-2 viral copies this assay can detect is 138 copies/mL. A negative result does not preclude SARS-Cov-2 infection and should not be used as the sole basis for treatment or other patient management decisions. A negative result may occur with  improper specimen collection/handling, submission of specimen other than nasopharyngeal swab, presence of viral mutation(s) within the areas targeted by this assay, and inadequate number of viral copies(<138 copies/mL). A negative result must be combined with clinical observations, patient history, and epidemiological information. The expected result  is Negative.  Fact Sheet for Patients:  EntrepreneurPulse.com.au  Fact Sheet for Healthcare Providers:  IncredibleEmployment.be  This test is no t yet approved or cleared by the [place] FDA and  has been authorized for detection and/or diagnosis of SARS-CoV-2 by FDA under an Emergency Use Authorization (EUA). This EUA will remain  in effect (meaning this test can be used) for the duration of the COVID-19 declaration under Section 564(b)(1) of the Act, 21 U.S.C.section 360bbb-3(b)(1), unless the authorization is terminated  or revoked sooner.       Influenza A by PCR NEGATIVE NEGATIVE   Influenza B by PCR NEGATIVE NEGATIVE    Comment: (NOTE) The Xpert Xpress SARS-CoV-2/FLU/RSV plus assay is intended as an aid in the diagnosis of influenza from Nasopharyngeal swab specimens and should not be used as a sole basis for treatment. Nasal washings and aspirates are unacceptable for Xpert Xpress SARS-CoV-2/FLU/RSV testing.  Fact Sheet for Patients: EntrepreneurPulse.com.au  Fact Sheet for Healthcare Providers: IncredibleEmployment.be  This test is not yet approved or cleared by the [place] FDA and has been authorized for detection and/or diagnosis of SARS-CoV-2 by FDA under an Emergency Use Authorization (EUA). This EUA will remain in effect (meaning this test can be used) for the duration of the COVID-19 declaration under Section 564(b)(1) of the Act, 21 U.S.C. section 360bbb-3(b)(1), unless the authorization is terminated or revoked.  Performed at [facility], [place] [address], [place], [place] [address]     No results found.  Assessment/Plan **hyperkalemia: marked with assoc EKG changes - has been medically managed in the ED but will need urgent HD tonight - have d/w on call RN and treatment will be facilitated.   **ESRD on HD: HD tonight and assuming no issues could likely be  discharged to f/u at outpt HD tomorrow for usual treatment.  If unable to be discharged we will continue to do dialysis while admitted.   **N/V/abd pain: h/o pancreatitis.  Abd exam fairly benign on my exam.  Per ED provider.  **HTN: outpt records showing amlodipine 5 daily with post HD BPs well controlled. UF 3L with HD today as tolerated.    **BMM: velphoro if tolerating po intake and remains admitted  Dispo - HD tonight then return to ED for reassessment - possible he could be discharged if improved post dialysis.   Call with concerns.  Justin Mend 03/18/2021, 11:18 PM

## 2021-03-18 NOTE — ED Triage Notes (Signed)
PT BIB [facility] from home.  Pt is T,TH, SAT, Dialysis pt but center was unable to acces fistula on Sat so he did not receive Dialysis. He has been experiencing NVD since last night with generalized abd pain.  Pt has zofran at home which has been ineffective.    EMS VS 160/90, 99% RA, HR 80, CBG 132  PT is A&O.

## 2021-03-18 NOTE — ED Notes (Signed)
Hemodialysis consent form signed  by pt and this RN, consent form at bedside with pt to go usptairs

## 2021-03-18 NOTE — ED Provider Notes (Signed)
[facility] EMERGENCY DEPARTMENT Provider Note   CSN: [id number] Arrival date & time: 03/18/21  1554     History No chief complaint on file.   Jonathan Mcneil is a 65 y.o. male.  HPI    65 year old male comes in with chief complaint of nausea, vomiting, diarrhea.  Patient has history of diabetes, hypertension, neuropathy, ESRD on HD.  He reports that he normally gets his dialysis TTS, however his last HD was on Thursday and is scheduled for his next hemodialysis on Monday.  He comes in because of nausea, vomiting, abdominal pain and diarrhea.  Patient chronically has all of the symptoms, however over the last 24 hours he has had about 5-10 episodes each of emesis and diarrhea.  Both are nonbloody, emesis is mostly nonbilious however he is throwing up a lot of " phlegm".  Patient's abdominal pain is generalized.  He has no history of abdominal surgery.  Past Medical History:  Diagnosis Date   Cataract    Diabetes mellitus without complication ([place])    Diabetic retinopathy ([place])    ESRD (end stage renal disease) ([place])    GERD (gastroesophageal reflux disease)    PMH   Hypertension    Hypertensive retinopathy    Low back pain    Metabolic acidosis    Neuromuscular disorder ([place])    peripheral neuropathy   Pancreatitis    Schizophrenia ([place])    does not take medications    Patient Active Problem List   Diagnosis Date Noted   Abdominal pain with vomiting 10/20/2019   Hypokalemia 10/20/2019   Normocytic anemia 10/20/2019   Hyperkalemia    Acute pancreatitis 07/26/2019   Uremia 02/24/2018   History of anemia due to CKD 02/24/2018   Marijuana abuse 02/24/2018   ESRD (end stage renal disease) ([place])    Hypertension    Schizophrenia ([place])    Metabolic acidosis 40/98/1191   Hypertensive urgency 01/15/2018   Type 2 diabetes mellitus with stage 4 chronic kidney disease ([place]) 01/15/2018   CKD (chronic kidney disease) 02/14/2015   Acute kidney  injury superimposed on chronic kidney disease ([place]) 02/14/2015   Hyponatremia 02/14/2015   Back pain 02/14/2015   Neuropathy 02/14/2015   Obesity 02/14/2015   Abdominal pain 02/14/2015   Pancreatitis    Diabetes mellitus without complication ([place])    Pancreatitis, acute    Schizo-affective psychosis ([place]) 04/28/2013    Past Surgical History:  Procedure Laterality Date   A/V FISTULAGRAM Left 06/03/2018   Procedure: A/V FISTULAGRAM;  Surgeon: Marty Heck, MD;  Location: [facility];  Service: Cardiovascular;  Laterality: Left;   AV FISTULA PLACEMENT Left 02/11/2018   Procedure: INSERTION OF ARTERIOVENOUS (AV) FISTULA LEFT  ARM;  Surgeon: [name], MD;  Location: [facility];  Service: Vascular;  Laterality: Left;   [place] Left 04/17/2018   Procedure: BASILIC VEIN TRANSPOSITION SECOND STAGE;  Surgeon: [name], MD;  Location: [facility];  Service: Vascular;  Laterality: Left;   BIOPSY  10/20/2019   Procedure: BIOPSY;  Surgeon: Clarene Essex, MD;  Location: [facility];  Service: Endoscopy;;   COLONOSCOPY     DENTAL SURGERY     ESOPHAGOGASTRODUODENOSCOPY (EGD) WITH PROPOFOL N/A 10/20/2019   Procedure: ESOPHAGOGASTRODUODENOSCOPY (EGD) WITH PROPOFOL;  Surgeon: Clarene Essex, MD;  Location: [facility];  Service: Endoscopy;  Laterality: N/A;   INSERTION OF DIALYSIS CATHETER Right 02/25/2018   Procedure: INSERTION OF DIALYSIS CATHETER;  Surgeon: [name], MD;  Location: [facility];  Service: Vascular;  Laterality: Right;   PERIPHERAL VASCULAR BALLOON ANGIOPLASTY  06/03/2018   Procedure: PERIPHERAL VASCULAR BALLOON ANGIOPLASTY;  Surgeon: Marty Heck, MD;  Location: [facility];  Service: Cardiovascular;;  left a/v fistula       Family History  Problem Relation Age of Onset   Kidney failure Mother    Diabetes Father    Blindness Brother     Social History   Tobacco Use   Smoking status: Former    Smokeless tobacco: Never  Scientific laboratory technician Use: Never used  Substance Use Topics   Alcohol use: No   Drug use: Not Currently    Types: Cocaine, Marijuana    Comment: smoked marijuana a few days ago; he denies using cocaine    Home Medications Prior to Admission medications   Medication Sig Start Date End Date Taking? Authorizing Provider  acetaminophen (TYLENOL) 650 MG CR tablet Take 1,300 mg by mouth 2 (two) times daily as needed for pain.    [provider]  azithromycin (ZITHROMAX Z-PAK) 250 MG tablet Take 1 tablet (250 mg total) by mouth daily. 01/03/20   Charlann Lange, PA-C  B Complex-C-Folic Acid (DIALYVITE PO) Take 1 tablet by mouth daily.    [provider]  benzonatate (TESSALON) 100 MG capsule Take 1 capsule (100 mg total) by mouth every 8 (eight) hours. 01/03/20   Charlann Lange, PA-C  calcitRIOL (ROCALTROL) 0.25 MCG capsule Take 1 capsule (0.25 mcg total) by mouth every Monday, Wednesday, and Friday with hemodialysis. 03/02/18   Lavina Hamman, MD  gabapentin (NEURONTIN) 300 MG capsule Take 300 mg by mouth at bedtime. 09/20/20   [provider]  ondansetron (ZOFRAN) 8 MG tablet Take 8 mg by mouth every 8 (eight) hours as needed. 12/06/20   [provider]  pantoprazole (PROTONIX) 20 MG tablet Take 1 tablet (20 mg total) by mouth daily. 09/15/19   Varney Biles, MD  promethazine (PHENERGAN) 25 MG suppository Place 1 suppository (25 mg total) rectally every 8 (eight) hours as needed for nausea or vomiting. 09/15/19   Varney Biles, MD  sertraline (ZOLOFT) 25 MG tablet Take 25 mg by mouth every Monday, Wednesday, and Friday. 11/14/20   [provider]  sucralfate (CARAFATE) 1 g tablet Take 1 tablet (1 g total) by mouth 4 (four) times daily -  with meals and at bedtime. 09/15/19   Varney Biles, MD  sucroferric oxyhydroxide (VELPHORO) 500 MG chewable tablet Chew 1,000 mg by mouth 3 (three) times daily with meals. 10/12/19   [provider]  VELPHORO 500 MG chewable tablet Chew 500 mg by mouth 3 (three) times daily. 10/12/19   [provider]    Allergies    Ibuprofen, Penicillins, and Venlafaxine  Review of Systems   Review of Systems  Constitutional:  Positive for activity change.  Respiratory:  Negative for shortness of breath.   Cardiovascular:  Negative for chest pain.  Gastrointestinal:  Positive for abdominal distention, abdominal pain, diarrhea, nausea and vomiting.  All other systems reviewed and are negative.  Physical Exam Updated Vital Signs BP (!) 157/90    Pulse 79    Temp 97.6 F (36.4 C) (Oral)    Resp (!) 25    Ht 5\' 9"  (1.753 m)    Wt 95.3 kg    SpO2 98%    BMI 31.01 kg/m   Physical Exam Vitals and nursing note reviewed.  Constitutional:  Appearance: He is well-developed.  HENT:     Head: Atraumatic.  Cardiovascular:     Rate and Rhythm: Bradycardia present.  Pulmonary:     Effort: Pulmonary effort is normal.  Abdominal:     Palpations: Abdomen is soft.     Tenderness: There is abdominal tenderness. There is no guarding or rebound.  Musculoskeletal:     Cervical back: Neck supple.  Skin:    General: Skin is warm.  Neurological:     Mental Status: He is alert and oriented to person, place, and time.    ED Results / Procedures / Treatments   Labs (all labs ordered are listed, but only abnormal results are displayed) Labs Reviewed  CBC WITH DIFFERENTIAL/PLATELET - Abnormal; Notable for the following components:      Result Value   RBC 3.78 (*)    MCV 108.5 (*)    MCH 34.7 (*)    RDW 15.6 (*)    nRBC 0.5 (*)    All other components within normal limits  COMPREHENSIVE METABOLIC PANEL - Abnormal; Notable for the following components:   Potassium >7.5 (*)    CO2 21 (*)    Glucose, Bld 135 (*)    BUN 79 (*)    Creatinine, Ser 13.23 (*)    GFR, Estimated 4 (*)    Anion gap 16 (*)    All other components within normal limits  MAGNESIUM - Abnormal; Notable  for the following components:   Magnesium 2.8 (*)    All other components within normal limits  PHOSPHORUS - Abnormal; Notable for the following components:   Phosphorus 7.5 (*)    All other components within normal limits  I-STAT VENOUS BLOOD GAS, ED - Abnormal; Notable for the following components:   pCO2, Ven 39.1 (*)    Sodium 134 (*)    Potassium 8.1 (*)    Calcium, Ion 0.97 (*)    All other components within normal limits  I-STAT CHEM 8, ED - Abnormal; Notable for the following components:   Potassium 8.1 (*)    BUN 88 (*)    Creatinine, Ser 13.50 (*)    Glucose, Bld 125 (*)    Calcium, Ion 1.05 (*)    All other components within normal limits  RESP PANEL BY RT-PCR (FLU A&B, COVID) [id number]    EKG EKG Interpretation  Date/Time:  Sunday March 18 2021 16:04:56 EST Ventricular Rate:  62 PR Interval:  158 QRS Duration: 103 QT Interval:  449 QTC Calculation: 456 R Axis:   -16 Text Interpretation: Sinus rhythm Borderline left axis deviation No acute changes No significant change since last tracing Confirmed by Varney Biles [phone]) on 03/18/2021 5:20:57 PM    EKG Interpretation  Date/Time:  Sunday March 18 2021 17:35:47 EST Ventricular Rate:  51 PR Interval:  158 QRS Duration: 112 QT Interval:  487 QTC Calculation: 449 R Axis:   -61 Text Interpretation: Junctional rhythm LAD, consider left anterior fascicular block junctional rhythm - new change since last ekg Confirmed by Varney Biles [phone]) on 03/18/2021 6:14:22 PM         Radiology No results found.  Procedures .Critical Care Performed by: Varney Biles, MD Authorized by: Varney Biles, MD   Critical care provider statement:    Critical care time (minutes):  78   Critical care was necessary to treat or prevent imminent or life-threatening deterioration of the following conditions:  Renal failure and metabolic crisis   Critical care was time spent personally by  me on the following  activities:  Development of treatment plan with patient or surrogate, discussions with consultants, evaluation of patient's response to treatment, examination of patient, ordering and review of laboratory studies, ordering and review of radiographic studies, ordering and performing treatments and interventions, pulse oximetry, re-evaluation of patient's condition, review of old charts and obtaining history from patient or surrogate   Medications Ordered in ED Medications  sodium zirconium cyclosilicate (LOKELMA) packet 10 g (has no administration in time range)  ondansetron (ZOFRAN) injection 4 mg (has no administration in time range)  calcium gluconate inj 10% (1 g) URGENT USE ONLY! (1 g Intravenous Given 03/18/21 1811)  sodium bicarbonate injection 50 mEq (50 mEq Intravenous Given 03/18/21 1811)  ondansetron (ZOFRAN) injection 4 mg (4 mg Intravenous Given 03/18/21 1826)  calcium gluconate inj 10% (1 g) URGENT USE ONLY! (1 g Intravenous Given 03/18/21 2001)  insulin aspart (novoLOG) injection 5 Units (5 Units Intravenous Given 03/18/21 2004)    And  dextrose 50 % solution 50 mL (50 mLs Intravenous Given 03/18/21 2006)  sodium bicarbonate injection 50 mEq (50 mEq Intravenous Given 03/18/21 2008)  midazolam (VERSED) injection 1 mg (1 mg Intravenous Given 03/18/21 2013)    ED Course  I have reviewed the triage vital signs and the nursing notes.  Pertinent labs & imaging results that were available during my care of the patient were reviewed by me and considered in my medical decision making (see chart for details).  Clinical Course as of 03/18/21 2016  Sun Mar 18, 2021  1750 Potassium(!!): 8.1 Nursing staff informed me that patient had a run of bradycardia.  Rhythm strip reveals that he was in junctional rhythm.  I then noticed that his potassium in venous blood gas is 8.1.  We will give him calcium and bicarb at this time. [AN]  2014 Potassium(!!): >7.5 Giving him another round of calcium  and bicarb. Also adding Lokelma, insulin and dextrose. [AN]  2014 Discussed case with Dr. Johnney Ou nephrology.  She recommends ICU admission.  She will see the patient as soon as possible. [AN]  2015 ICU team to admit.  Persistent nausea, episode of junctional rhythm, possibly difficult access to fistula -all making this a high risk patient.  For his persistent nausea, Zofran and 1 mg of IV Versed ordered. [AN]    Clinical Course User Index [AN] Varney Biles, MD   MDM Rules/Calculators/A&P                          65 year old male comes in with chief complaint of abdominal pain, nausea, vomiting and diarrhea.  Seems like the HD center had difficulty in accessing his fistula on Saturday, therefore he last had his dialysis on Thursday.  Since Saturday he has started experiencing nausea, vomiting and diarrhea along with generalized abdominal pain.  Exam is not peritoneal.      Final Clinical Impression(s) / ED Diagnoses Final diagnoses:  Nausea vomiting and diarrhea  ESRD on hemodialysis ([place])  Acute hyperkalemia    Rx / DC Orders ED Discharge Orders     None        Varney Biles, MD 03/18/21 2016

## 2021-03-19 ENCOUNTER — Emergency Department (HOSPITAL_COMMUNITY): Payer: Medicare Other

## 2021-03-19 DIAGNOSIS — Z841 Family history of disorders of kidney and ureter: Secondary | ICD-10-CM | POA: Diagnosis not present

## 2021-03-19 DIAGNOSIS — R197 Diarrhea, unspecified: Secondary | ICD-10-CM

## 2021-03-19 DIAGNOSIS — Z880 Allergy status to penicillin: Secondary | ICD-10-CM | POA: Diagnosis not present

## 2021-03-19 DIAGNOSIS — Z833 Family history of diabetes mellitus: Secondary | ICD-10-CM | POA: Diagnosis not present

## 2021-03-19 DIAGNOSIS — F1210 Cannabis abuse, uncomplicated: Secondary | ICD-10-CM | POA: Diagnosis present

## 2021-03-19 DIAGNOSIS — J90 Pleural effusion, not elsewhere classified: Secondary | ICD-10-CM | POA: Diagnosis not present

## 2021-03-19 DIAGNOSIS — E669 Obesity, unspecified: Secondary | ICD-10-CM | POA: Diagnosis present

## 2021-03-19 DIAGNOSIS — Z888 Allergy status to other drugs, medicaments and biological substances status: Secondary | ICD-10-CM | POA: Diagnosis not present

## 2021-03-19 DIAGNOSIS — R112 Nausea with vomiting, unspecified: Secondary | ICD-10-CM

## 2021-03-19 DIAGNOSIS — K219 Gastro-esophageal reflux disease without esophagitis: Secondary | ICD-10-CM | POA: Diagnosis present

## 2021-03-19 DIAGNOSIS — Y92009 Unspecified place in unspecified non-institutional (private) residence as the place of occurrence of the external cause: Secondary | ICD-10-CM | POA: Diagnosis not present

## 2021-03-19 DIAGNOSIS — T40711A Poisoning by cannabis, accidental (unintentional), initial encounter: Secondary | ICD-10-CM | POA: Diagnosis present

## 2021-03-19 DIAGNOSIS — K529 Noninfective gastroenteritis and colitis, unspecified: Secondary | ICD-10-CM | POA: Diagnosis present

## 2021-03-19 DIAGNOSIS — R109 Unspecified abdominal pain: Secondary | ICD-10-CM | POA: Diagnosis not present

## 2021-03-19 DIAGNOSIS — I132 Hypertensive heart and chronic kidney disease with heart failure and with stage 5 chronic kidney disease, or end stage renal disease: Secondary | ICD-10-CM | POA: Diagnosis present

## 2021-03-19 DIAGNOSIS — N2581 Secondary hyperparathyroidism of renal origin: Secondary | ICD-10-CM | POA: Diagnosis present

## 2021-03-19 DIAGNOSIS — N261 Atrophy of kidney (terminal): Secondary | ICD-10-CM | POA: Diagnosis not present

## 2021-03-19 DIAGNOSIS — Z821 Family history of blindness and visual loss: Secondary | ICD-10-CM | POA: Diagnosis not present

## 2021-03-19 DIAGNOSIS — K521 Toxic gastroenteritis and colitis: Secondary | ICD-10-CM | POA: Diagnosis present

## 2021-03-19 DIAGNOSIS — I120 Hypertensive chronic kidney disease with stage 5 chronic kidney disease or end stage renal disease: Secondary | ICD-10-CM | POA: Diagnosis not present

## 2021-03-19 DIAGNOSIS — D631 Anemia in chronic kidney disease: Secondary | ICD-10-CM | POA: Diagnosis not present

## 2021-03-19 DIAGNOSIS — R1011 Right upper quadrant pain: Secondary | ICD-10-CM | POA: Diagnosis not present

## 2021-03-19 DIAGNOSIS — I5033 Acute on chronic diastolic (congestive) heart failure: Secondary | ICD-10-CM | POA: Diagnosis present

## 2021-03-19 DIAGNOSIS — Z6831 Body mass index (BMI) 31.0-31.9, adult: Secondary | ICD-10-CM | POA: Diagnosis not present

## 2021-03-19 DIAGNOSIS — E875 Hyperkalemia: Secondary | ICD-10-CM | POA: Diagnosis not present

## 2021-03-19 DIAGNOSIS — Z20822 Contact with and (suspected) exposure to covid-19: Secondary | ICD-10-CM | POA: Diagnosis present

## 2021-03-19 DIAGNOSIS — A084 Viral intestinal infection, unspecified: Secondary | ICD-10-CM | POA: Diagnosis present

## 2021-03-19 DIAGNOSIS — Z992 Dependence on renal dialysis: Secondary | ICD-10-CM | POA: Diagnosis not present

## 2021-03-19 DIAGNOSIS — I700 Atherosclerosis of aorta: Secondary | ICD-10-CM | POA: Diagnosis not present

## 2021-03-19 DIAGNOSIS — E1122 Type 2 diabetes mellitus with diabetic chronic kidney disease: Secondary | ICD-10-CM | POA: Diagnosis present

## 2021-03-19 DIAGNOSIS — R188 Other ascites: Secondary | ICD-10-CM | POA: Diagnosis not present

## 2021-03-19 DIAGNOSIS — E1142 Type 2 diabetes mellitus with diabetic polyneuropathy: Secondary | ICD-10-CM | POA: Diagnosis present

## 2021-03-19 DIAGNOSIS — N186 End stage renal disease: Secondary | ICD-10-CM | POA: Diagnosis present

## 2021-03-19 DIAGNOSIS — Z1810 Retained metal fragments, unspecified: Secondary | ICD-10-CM | POA: Diagnosis not present

## 2021-03-19 DIAGNOSIS — F32A Depression, unspecified: Secondary | ICD-10-CM | POA: Diagnosis present

## 2021-03-19 DIAGNOSIS — J9601 Acute respiratory failure with hypoxia: Secondary | ICD-10-CM | POA: Diagnosis present

## 2021-03-19 LAB — RENAL FUNCTION PANEL
Albumin: 4.4 g/dL (ref 3.5–5.0)
Anion gap: 16 — ABNORMAL HIGH (ref 5–15)
BUN: 35 mg/dL — ABNORMAL HIGH (ref 8–23)
CO2: 24 mmol/L (ref 22–32)
Calcium: 9.7 mg/dL (ref 8.9–10.3)
Chloride: 94 mmol/L — ABNORMAL LOW (ref 98–111)
Creatinine, Ser: 6.6 mg/dL — ABNORMAL HIGH (ref 0.61–1.24)
GFR, Estimated: 9 mL/min — ABNORMAL LOW (ref >60)
Glucose, Bld: 85 mg/dL (ref 70–99)
Phosphorus: 3.1 mg/dL (ref 2.5–4.6)
Potassium: 4 mmol/L (ref 3.5–5.1)
Sodium: 134 mmol/L — ABNORMAL LOW (ref 135–145)

## 2021-03-19 LAB — HEPATIC FUNCTION PANEL
ALT: 20 U/L (ref 0–44)
AST: 25 U/L (ref 15–41)
Albumin: 3.9 g/dL (ref 3.5–5.0)
Alkaline Phosphatase: 102 U/L (ref 38–126)
Bilirubin, Direct: 0.5 mg/dL — ABNORMAL HIGH (ref 0.0–0.2)
Indirect Bilirubin: 1.1 mg/dL — ABNORMAL HIGH (ref 0.3–0.9)
Total Bilirubin: 1.6 mg/dL — ABNORMAL HIGH (ref 0.3–1.2)
Total Protein: 7.7 g/dL (ref 6.5–8.1)

## 2021-03-19 LAB — BASIC METABOLIC PANEL
Anion gap: 17 — ABNORMAL HIGH (ref 5–15)
BUN: 43 mg/dL — ABNORMAL HIGH (ref 8–23)
CO2: 24 mmol/L (ref 22–32)
Calcium: 9.5 mg/dL (ref 8.9–10.3)
Chloride: 95 mmol/L — ABNORMAL LOW (ref 98–111)
Creatinine, Ser: 8.66 mg/dL — ABNORMAL HIGH (ref 0.61–1.24)
GFR, Estimated: 6 mL/min — ABNORMAL LOW (ref >60)
Glucose, Bld: 84 mg/dL (ref 70–99)
Potassium: 5.1 mmol/L (ref 3.5–5.1)
Sodium: 136 mmol/L (ref 135–145)

## 2021-03-19 LAB — HEPATITIS B SURFACE ANTIGEN: Hepatitis B Surface Ag: NONREACTIVE

## 2021-03-19 LAB — CBC WITH DIFFERENTIAL/PLATELET
Abs Immature Granulocytes: 0.02 10*3/uL (ref 0.00–0.07)
Basophils Absolute: 0.1 10*3/uL (ref 0.0–0.1)
Basophils Relative: 1 %
Eosinophils Absolute: 0.3 10*3/uL (ref 0.0–0.5)
Eosinophils Relative: 4 %
HCT: 38.7 % — ABNORMAL LOW (ref 39.0–52.0)
Hemoglobin: 12.6 g/dL — ABNORMAL LOW (ref 13.0–17.0)
Immature Granulocytes: 0 %
Lymphocytes Relative: 19 %
Lymphs Abs: 1.2 10*3/uL (ref 0.7–4.0)
MCH: 34.4 pg — ABNORMAL HIGH (ref 26.0–34.0)
MCHC: 32.6 g/dL (ref 30.0–36.0)
MCV: 105.7 fL — ABNORMAL HIGH (ref 80.0–100.0)
Monocytes Absolute: 0.6 10*3/uL (ref 0.1–1.0)
Monocytes Relative: 9 %
Neutro Abs: 4.3 10*3/uL (ref 1.7–7.7)
Neutrophils Relative %: 67 %
Platelets: 132 10*3/uL — ABNORMAL LOW (ref 150–400)
RBC: 3.66 MIL/uL — ABNORMAL LOW (ref 4.22–5.81)
RDW: 15.5 % (ref 11.5–15.5)
WBC: 6.4 10*3/uL (ref 4.0–10.5)
nRBC: 0 % (ref 0.0–0.2)

## 2021-03-19 LAB — HEMOGLOBIN A1C
Hgb A1c MFr Bld: 5.3 % (ref 4.8–5.6)
Mean Plasma Glucose: 105.41 mg/dL

## 2021-03-19 LAB — HEPATITIS B SURFACE ANTIBODY,QUALITATIVE: Hep B S Ab: REACTIVE — AB

## 2021-03-19 LAB — LIPASE, BLOOD: Lipase: 55 U/L — ABNORMAL HIGH (ref 11–51)

## 2021-03-19 MED ORDER — ONDANSETRON HCL 4 MG/2ML IJ SOLN
4.0000 mg | Freq: Once | INTRAMUSCULAR | Status: AC
  Administered 2021-03-19: 03:00:00 4 mg via INTRAVENOUS
  Filled 2021-03-19: qty 2

## 2021-03-19 MED ORDER — GABAPENTIN 300 MG PO CAPS
300.0000 mg | ORAL_CAPSULE | Freq: Every day | ORAL | Status: DC
  Administered 2021-03-19: 300 mg via ORAL
  Filled 2021-03-19: qty 1

## 2021-03-19 MED ORDER — PENTAFLUOROPROP-TETRAFLUOROETH EX AERO
1.0000 "application " | INHALATION_SPRAY | CUTANEOUS | Status: DC | PRN
  Filled 2021-03-19: qty 116

## 2021-03-19 MED ORDER — LORAZEPAM 2 MG/ML IJ SOLN
1.0000 mg | Freq: Four times a day (QID) | INTRAMUSCULAR | Status: AC | PRN
  Administered 2021-03-19: 16:00:00 1 mg via INTRAVENOUS
  Filled 2021-03-19: qty 1

## 2021-03-19 MED ORDER — HEPARIN SODIUM (PORCINE) 5000 UNIT/ML IJ SOLN
5000.0000 [IU] | Freq: Three times a day (TID) | INTRAMUSCULAR | Status: DC
  Filled 2021-03-19 (×2): qty 1

## 2021-03-19 MED ORDER — SCOPOLAMINE 1 MG/3DAYS TD PT72
1.0000 | MEDICATED_PATCH | TRANSDERMAL | Status: DC
  Administered 2021-03-19: 11:00:00 1.5 mg via TRANSDERMAL
  Filled 2021-03-19: qty 1

## 2021-03-19 MED ORDER — SUCROFERRIC OXYHYDROXIDE 500 MG PO CHEW
2000.0000 mg | CHEWABLE_TABLET | Freq: Every day | ORAL | Status: DC
  Filled 2021-03-19: qty 4

## 2021-03-19 MED ORDER — SODIUM CHLORIDE 0.9 % IV SOLN: 100.0000 mL | INTRAVENOUS | Status: DC | PRN

## 2021-03-19 MED ORDER — LIDOCAINE-PRILOCAINE 2.5-2.5 % EX CREA
1.0000 "application " | TOPICAL_CREAM | CUTANEOUS | Status: DC | PRN
  Filled 2021-03-19: qty 5

## 2021-03-19 MED ORDER — HEPARIN SODIUM (PORCINE) 1000 UNIT/ML DIALYSIS: 1000.0000 [IU] | INTRAMUSCULAR | Status: DC | PRN

## 2021-03-19 MED ORDER — ACETAMINOPHEN 325 MG PO TABS
650.0000 mg | ORAL_TABLET | Freq: Four times a day (QID) | ORAL | Status: DC | PRN
  Administered 2021-03-19 (×2): 650 mg via ORAL
  Filled 2021-03-19 (×2): qty 2

## 2021-03-19 MED ORDER — INSULIN ASPART 100 UNIT/ML IJ SOLN: 0.0000 [IU] | Freq: Every day | INTRAMUSCULAR | Status: DC

## 2021-03-19 MED ORDER — LIDOCAINE-PRILOCAINE 2.5-2.5 % EX CREA: 1.0000 "application " | TOPICAL_CREAM | CUTANEOUS | Status: DC | PRN

## 2021-03-19 MED ORDER — ACETAMINOPHEN 650 MG RE SUPP: 650.0000 mg | Freq: Four times a day (QID) | RECTAL | Status: DC | PRN

## 2021-03-19 MED ORDER — ALTEPLASE 2 MG IJ SOLR: 2.0000 mg | Freq: Once | INTRAMUSCULAR | Status: DC | PRN

## 2021-03-19 MED ORDER — LIDOCAINE HCL (PF) 1 % IJ SOLN: 5.0000 mL | INTRAMUSCULAR | Status: DC | PRN

## 2021-03-19 MED ORDER — FENTANYL CITRATE PF 50 MCG/ML IJ SOSY
50.0000 ug | PREFILLED_SYRINGE | Freq: Once | INTRAMUSCULAR | Status: AC
  Administered 2021-03-19: 03:00:00 50 ug via INTRAVENOUS
  Filled 2021-03-19: qty 1

## 2021-03-19 MED ORDER — INSULIN ASPART 100 UNIT/ML IJ SOLN: 0.0000 [IU] | Freq: Three times a day (TID) | INTRAMUSCULAR | Status: DC

## 2021-03-19 MED ORDER — HEPARIN SODIUM (PORCINE) 1000 UNIT/ML DIALYSIS
1000.0000 [IU] | INTRAMUSCULAR | Status: DC | PRN
  Filled 2021-03-19: qty 1

## 2021-03-19 MED ORDER — PENTAFLUOROPROP-TETRAFLUOROETH EX AERO: 1.0000 "application " | INHALATION_SPRAY | CUTANEOUS | Status: DC | PRN
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
## 2021-03-19 NOTE — ED Notes (Signed)
Pt given a cup of water 

## 2021-03-19 NOTE — ED Notes (Signed)
Patient transported to Ultrasound 

## 2021-03-19 NOTE — ED Notes (Signed)
Patient transported to CT 

## 2021-03-19 NOTE — ED Provider Notes (Signed)
Patient returns from having emergent dialysis.  Did miss last dialysis session.  Came in for abdominal pain, vomiting and diarrhea.  Still having upper abdominal pain to epigastric and right upper quadrant.  States had pancreatitis 1 time many years ago is not certain if this feels similar.  No leukocytosis.  LFTs are normal.  Lipase will be sent.  Potassium to be rechecked after dialysis  Potassium has improved to 4.0. CT without acute pathology Lipase 55. LFts nowmal.  Small amount of fluid around the gallbladder but no cholecystitis.  Patient still with nausea abdominal pain and dry heaving.  Does not feel he can go home.  Plan overnight observation admission for further evaluation by hospitalist service.  D/w Dr. Marlowe Sax.     Ezequiel Essex, MD 03/19/21 [phone]

## 2021-03-19 NOTE — ED Notes (Signed)
Pt has refused his labs and would like for them to be pulled from his IV.

## 2021-03-19 NOTE — ED Notes (Signed)
This RN went in to check on pt. Pt's O2 saturation was 87% with a good wave form. This RN placed pt on 2l [place]. Pt is now sating 96%. Will continue to monitor.
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
## 2021-03-19 NOTE — Progress Notes (Signed)
Jonathan Mcneil is a 65 year old admitted as observation for ABD pain, N/V, and hyperkalemia. Upon arrival to the ED, patient was found to have K+ level of 8.1. He reports missing HD on 03/17/21 d/t issues with cannulation. He was emergently dialyzed overnight. He tolerated net UF 3L. Seen and examined at bedside in ED. Currently feeling better but still c/o N/V. K+ level now 5.1. Plan for HD tomorrow 1st shift. Will consult formally if patient status upgraded to in-patient.  HD Orders: [facility]-MWF 4hrs87min BFR 550 DFR Auto 1.5 EDW 90.5kg 2K/2Ca Profile 4 L AVF  Tobie Poet, NP

## 2021-03-19 NOTE — ED Notes (Addendum)
Pt NAD in bed with Bryan applied. A/ox4, speaking in full and complete sentences. Pt states he was at dialysis Saturday but they could not access his fistula and sent him home, telling him that if he becomes sick to come into the ED. Pt did begin having diffuse ABD pain and approximately 15 episodes of clear vomiting. Pt states he is feeling better overall but is still nauseated with mild ABD pain. Denies dizziness, CP, SOB. +headache. ABD soft, tender throughout. LS clear. VSS.

## 2021-03-20 DIAGNOSIS — K529 Noninfective gastroenteritis and colitis, unspecified: Secondary | ICD-10-CM

## 2021-03-20 DIAGNOSIS — E875 Hyperkalemia: Secondary | ICD-10-CM

## 2021-03-20 DIAGNOSIS — N186 End stage renal disease: Secondary | ICD-10-CM

## 2021-03-20 LAB — BASIC METABOLIC PANEL
Anion gap: 17 — ABNORMAL HIGH (ref 5–15)
BUN: 55 mg/dL — ABNORMAL HIGH (ref 8–23)
CO2: 24 mmol/L (ref 22–32)
Calcium: 9.4 mg/dL (ref 8.9–10.3)
Chloride: 94 mmol/L — ABNORMAL LOW (ref 98–111)
Creatinine, Ser: 11.21 mg/dL — ABNORMAL HIGH (ref 0.61–1.24)
GFR, Estimated: 5 mL/min — ABNORMAL LOW (ref >60)
Glucose, Bld: 123 mg/dL — ABNORMAL HIGH (ref 70–99)
Potassium: 4.6 mmol/L (ref 3.5–5.1)
Sodium: 135 mmol/L (ref 135–145)

## 2021-03-20 LAB — CBC
HCT: 36.6 % — ABNORMAL LOW (ref 39.0–52.0)
Hemoglobin: 12 g/dL — ABNORMAL LOW (ref 13.0–17.0)
MCH: 34.9 pg — ABNORMAL HIGH (ref 26.0–34.0)
MCHC: 32.8 g/dL (ref 30.0–36.0)
MCV: 106.4 fL — ABNORMAL HIGH (ref 80.0–100.0)
Platelets: 142 10*3/uL — ABNORMAL LOW (ref 150–400)
RBC: 3.44 MIL/uL — ABNORMAL LOW (ref 4.22–5.81)
RDW: 15 % (ref 11.5–15.5)
WBC: 5.2 10*3/uL (ref 4.0–10.5)
nRBC: 0 % (ref 0.0–0.2)

## 2021-03-20 LAB — HIV ANTIBODY (ROUTINE TESTING W REFLEX): HIV Screen 4th Generation wRfx: NONREACTIVE

## 2021-03-20 MED ORDER — LOPERAMIDE HCL 2 MG PO TABS: 2.0000 mg | ORAL_TABLET | Freq: Four times a day (QID) | ORAL | 0 refills | Status: DC | PRN
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
## 2021-03-20 NOTE — Progress Notes (Signed)
Pt discharged home in stable condition 

## 2021-03-20 NOTE — Discharge Summary (Signed)
Physician Discharge Summary  Patient ID: Jonathan Mcneil MRN: [id number] DOB/AGE: October 24, 1955 65 y.o.  Admit date: 03/18/2021 Discharge date: 03/20/2021  Admission Diagnoses:  Discharge Diagnoses:  Principal Problem:   Intractable nausea and vomiting Active Problems:   Obesity (BMI 30.0-34.9)   ESRD (end stage renal disease) ([place])   Hypertension   Hyperkalemia   Diarrhea   Gastroenteritis   Discharged Condition: good  Hospital Course:   Jonathan Mcneil is a 65 y.o. male with medical history significant of ESRD on HD TTS, diet-controlled type 2 diabetes, diabetic retinopathy, peripheral neuropathy, history of pancreatitis, schizophrenia, substance abuse (marijuana and cocaine), GERD, hypertension presented to the ED for evaluation of nausea, vomiting, abdominal pain, and diarrhea.  Upon arriving the hospital, his oxygen saturation was 88%, potassium 8.1 with end-stage renal disease. Patiently emergently dialyzed, and admit to the hospital for further treatment.  Intractable nausea, vomiting with abdominal pain and diarrhea. Acute gastroenteritis. CT scan of abdomen/pelvis without acute changes. This could be a viral gastroenteritis versus marijuana use. Patient has improved, currently no nausea vomiting.  He states that he has some chronic diarrhea, will prescribe Imodium.  End-stage renal disease  severe hyperkalemia with EKG changes. Patient potassium level was 8.1, potassium was normalized after dialysis. Patient is dialyzed x2.  Condition had improved.  Acute hypoxemic respiratory failure.   Acute on chronic diastolic congestive heart failure. Patient is off oxygen, condition much improved.  No short of breath.  Medically stable to be discharged.  Type 2 diabetes. Stable.  Consults: nephrology  Significant Diagnostic Studies:   Treatments: HD  Discharge Exam: Blood pressure 127/81, pulse 79, temperature 98.2 F (36.8 C), temperature source Oral, resp.  rate 16, height 5\' 9"  (1.753 m), weight 90.1 kg, SpO2 98 %. General appearance: alert and cooperative Resp: clear to auscultation bilaterally Cardio: regular rate and rhythm, S1, S2 normal, no murmur, click, rub or gallop GI: soft, non-tender; bowel sounds normal; no masses,  no organomegaly Extremities: extremities normal, atraumatic, no cyanosis or edema  Disposition: Discharge disposition: 01-Home or Self Care       Discharge Instructions     Diet general   Complete by: As directed    Renal diet   Increase activity slowly   Complete by: As directed       Allergies as of 03/20/2021       Reactions   Ibuprofen Other (See Comments)   Unknown reaction Other reaction(s): hives   Penicillin G    Other reaction(s): hives   Penicillins Itching, Other (See Comments)   Has patient had a PCN reaction causing immediate rash, facial/tongue/throat swelling, SOB or lightheadedness with hypotension: No Has patient had a PCN reaction causing severe rash involving mucus membranes or skin necrosis: No Has patient had a PCN reaction that required hospitalization No Has patient had a PCN reaction occurring within the last 10 years: Unknown If all of the above answers are "NO", then may proceed with Cephalosporin use.   Venlafaxine    Other reaction(s): insomnia        Medication List     STOP taking these medications    azithromycin 250 MG tablet Commonly known as: Zithromax Z-Pak   benzonatate 100 MG capsule Commonly known as: TESSALON   calcitRIOL 0.25 MCG capsule Commonly known as: ROCALTROL   pantoprazole 20 MG tablet Commonly known as: PROTONIX   promethazine 25 MG suppository Commonly known as: PHENERGAN   sucralfate 1 g tablet Commonly known as: Carafate  TAKE these medications    acetaminophen 650 MG CR tablet Commonly known as: TYLENOL Take 1,300 mg by mouth daily.   DIALYVITE PO Take 1 tablet by mouth daily.   gabapentin 300 MG  capsule Commonly known as: NEURONTIN Take 300 mg by mouth at bedtime.   loperamide 2 MG tablet Commonly known as: Imodium A-D Take 1 tablet (2 mg total) by mouth 4 (four) times daily as needed for diarrhea or loose stools.   ondansetron 8 MG tablet Commonly known as: ZOFRAN Take 8 mg by mouth every 8 (eight) hours as needed for vomiting or nausea.   sertraline 25 MG tablet Commonly known as: ZOLOFT Take 25 mg by mouth every Monday, Wednesday, and Friday.   Velphoro 500 MG chewable tablet Generic drug: sucroferric oxyhydroxide Chew 2,000 mg by mouth daily after breakfast.        Follow-up Information     Merrilee Seashore, MD Follow up in 1 week(s).   Specialty: Internal Medicine Contact information: [address] [place] [address] [phone]                 Signed: Sharen Hones 03/20/2021, 1:35 PM

## 2021-03-20 NOTE — Progress Notes (Signed)
2500cc removed during HD today

## 2021-03-21 LAB — HEPATITIS B SURFACE ANTIBODY, QUANTITATIVE: Hep B S AB Quant (Post): >1000 m[IU]/mL (ref >9.9)

## 2021-03-22 DIAGNOSIS — R52 Pain, unspecified: Secondary | ICD-10-CM | POA: Diagnosis not present

## 2021-03-22 DIAGNOSIS — N2581 Secondary hyperparathyroidism of renal origin: Secondary | ICD-10-CM | POA: Diagnosis not present

## 2021-03-22 DIAGNOSIS — N186 End stage renal disease: Secondary | ICD-10-CM | POA: Diagnosis not present

## 2021-03-22 DIAGNOSIS — Z992 Dependence on renal dialysis: Secondary | ICD-10-CM | POA: Diagnosis not present

## 2021-03-23 ENCOUNTER — Encounter (INDEPENDENT_AMBULATORY_CARE_PROVIDER_SITE_OTHER): Payer: Self-pay | Admitting: Ophthalmology

## 2021-03-23 ENCOUNTER — Other Ambulatory Visit: Payer: Self-pay

## 2021-03-23 ENCOUNTER — Ambulatory Visit (INDEPENDENT_AMBULATORY_CARE_PROVIDER_SITE_OTHER): Payer: Medicare Other | Admitting: Ophthalmology

## 2021-03-23 DIAGNOSIS — H43812 Vitreous degeneration, left eye: Secondary | ICD-10-CM | POA: Diagnosis not present

## 2021-03-23 DIAGNOSIS — H35033 Hypertensive retinopathy, bilateral: Secondary | ICD-10-CM | POA: Diagnosis not present

## 2021-03-23 DIAGNOSIS — E113513 Type 2 diabetes mellitus with proliferative diabetic retinopathy with macular edema, bilateral: Secondary | ICD-10-CM | POA: Diagnosis not present

## 2021-03-23 DIAGNOSIS — H4313 Vitreous hemorrhage, bilateral: Secondary | ICD-10-CM

## 2021-03-23 DIAGNOSIS — H4311 Vitreous hemorrhage, right eye: Secondary | ICD-10-CM

## 2021-03-23 DIAGNOSIS — H25813 Combined forms of age-related cataract, bilateral: Secondary | ICD-10-CM

## 2021-03-23 DIAGNOSIS — I10 Essential (primary) hypertension: Secondary | ICD-10-CM | POA: Diagnosis not present

## 2021-03-23 DIAGNOSIS — H4312 Vitreous hemorrhage, left eye: Secondary | ICD-10-CM

## 2021-03-23 MED ORDER — BEVACIZUMAB CHEMO INJECTION 1.25MG/0.05ML SYRINGE FOR KALEIDOSCOPE
1.2500 mg | INTRAVITREAL | Status: AC | PRN
  Administered 2021-03-23: 13:00:00 1.25 mg via INTRAVITREAL

## 2021-03-23 MED ORDER — BEVACIZUMAB CHEMO INJECTION 1.25MG/0.05ML SYRINGE FOR KALEIDOSCOPE
1.2500 mg | INTRAVITREAL | Status: AC | PRN
Start: 2021-03-23 — End: 2021-03-23
  Administered 2021-03-23: 13:00:00 1.25 mg via INTRAVITREAL

## 2021-03-24 DIAGNOSIS — N186 End stage renal disease: Secondary | ICD-10-CM | POA: Diagnosis not present

## 2021-03-24 DIAGNOSIS — N2581 Secondary hyperparathyroidism of renal origin: Secondary | ICD-10-CM | POA: Diagnosis not present

## 2021-03-24 DIAGNOSIS — E875 Hyperkalemia: Secondary | ICD-10-CM | POA: Diagnosis not present

## 2021-03-24 DIAGNOSIS — R52 Pain, unspecified: Secondary | ICD-10-CM | POA: Diagnosis not present

## 2021-03-24 DIAGNOSIS — Z992 Dependence on renal dialysis: Secondary | ICD-10-CM | POA: Diagnosis not present

## 2021-03-24 DIAGNOSIS — I120 Hypertensive chronic kidney disease with stage 5 chronic kidney disease or end stage renal disease: Secondary | ICD-10-CM | POA: Diagnosis not present

## 2021-03-24 NOTE — TOC Transition Note (Signed)
Transition of care contact from inpatient facility  Date of discharge: 03/20/21 Date of contact: 03/21/21 Method: Phone Spoke to: Patient  Patient contacted to discuss transition of care from recent inpatient hospitalization. Patient was admitted to [facility] from 03/18/21-03/20/21 with discharge diagnosis of hyperkalemia (missed HD) and acute gastroenteritis  Medication changes were reviewed.  Patient will follow up with his outpatient HD unit on: 03/22/21 at [facility].  Tobie Poet, NP

## 2021-03-27 DIAGNOSIS — R52 Pain, unspecified: Secondary | ICD-10-CM | POA: Diagnosis not present

## 2021-03-27 DIAGNOSIS — N186 End stage renal disease: Secondary | ICD-10-CM | POA: Diagnosis not present

## 2021-03-27 DIAGNOSIS — R197 Diarrhea, unspecified: Secondary | ICD-10-CM | POA: Diagnosis not present

## 2021-03-27 DIAGNOSIS — E1129 Type 2 diabetes mellitus with other diabetic kidney complication: Secondary | ICD-10-CM | POA: Diagnosis not present

## 2021-03-27 DIAGNOSIS — Z992 Dependence on renal dialysis: Secondary | ICD-10-CM | POA: Diagnosis not present

## 2021-03-27 DIAGNOSIS — N2581 Secondary hyperparathyroidism of renal origin: Secondary | ICD-10-CM | POA: Diagnosis not present

## 2021-03-29 DIAGNOSIS — N2581 Secondary hyperparathyroidism of renal origin: Secondary | ICD-10-CM | POA: Diagnosis not present

## 2021-03-29 DIAGNOSIS — Z992 Dependence on renal dialysis: Secondary | ICD-10-CM | POA: Diagnosis not present

## 2021-03-29 DIAGNOSIS — R197 Diarrhea, unspecified: Secondary | ICD-10-CM | POA: Diagnosis not present

## 2021-03-29 DIAGNOSIS — N186 End stage renal disease: Secondary | ICD-10-CM | POA: Diagnosis not present

## 2021-03-29 DIAGNOSIS — R52 Pain, unspecified: Secondary | ICD-10-CM | POA: Diagnosis not present

## 2021-03-29 DIAGNOSIS — E1129 Type 2 diabetes mellitus with other diabetic kidney complication: Secondary | ICD-10-CM | POA: Diagnosis not present

## 2021-03-31 DIAGNOSIS — Z992 Dependence on renal dialysis: Secondary | ICD-10-CM | POA: Diagnosis not present

## 2021-03-31 DIAGNOSIS — R52 Pain, unspecified: Secondary | ICD-10-CM | POA: Diagnosis not present

## 2021-03-31 DIAGNOSIS — E1129 Type 2 diabetes mellitus with other diabetic kidney complication: Secondary | ICD-10-CM | POA: Diagnosis not present

## 2021-03-31 DIAGNOSIS — N186 End stage renal disease: Secondary | ICD-10-CM | POA: Diagnosis not present

## 2021-03-31 DIAGNOSIS — N2581 Secondary hyperparathyroidism of renal origin: Secondary | ICD-10-CM | POA: Diagnosis not present

## 2021-03-31 DIAGNOSIS — R197 Diarrhea, unspecified: Secondary | ICD-10-CM | POA: Diagnosis not present

## 2021-04-03 DIAGNOSIS — N186 End stage renal disease: Secondary | ICD-10-CM | POA: Diagnosis not present

## 2021-04-03 DIAGNOSIS — E1129 Type 2 diabetes mellitus with other diabetic kidney complication: Secondary | ICD-10-CM | POA: Diagnosis not present

## 2021-04-03 DIAGNOSIS — R52 Pain, unspecified: Secondary | ICD-10-CM | POA: Diagnosis not present

## 2021-04-03 DIAGNOSIS — R197 Diarrhea, unspecified: Secondary | ICD-10-CM | POA: Diagnosis not present

## 2021-04-03 DIAGNOSIS — Z992 Dependence on renal dialysis: Secondary | ICD-10-CM | POA: Diagnosis not present

## 2021-04-03 DIAGNOSIS — N2581 Secondary hyperparathyroidism of renal origin: Secondary | ICD-10-CM | POA: Diagnosis not present

## 2021-04-05 DIAGNOSIS — R197 Diarrhea, unspecified: Secondary | ICD-10-CM | POA: Diagnosis not present

## 2021-04-05 DIAGNOSIS — Z992 Dependence on renal dialysis: Secondary | ICD-10-CM | POA: Diagnosis not present

## 2021-04-05 DIAGNOSIS — N2581 Secondary hyperparathyroidism of renal origin: Secondary | ICD-10-CM | POA: Diagnosis not present

## 2021-04-05 DIAGNOSIS — E1129 Type 2 diabetes mellitus with other diabetic kidney complication: Secondary | ICD-10-CM | POA: Diagnosis not present

## 2021-04-05 DIAGNOSIS — N186 End stage renal disease: Secondary | ICD-10-CM | POA: Diagnosis not present

## 2021-04-05 DIAGNOSIS — R52 Pain, unspecified: Secondary | ICD-10-CM | POA: Diagnosis not present

## 2021-04-07 DIAGNOSIS — Z992 Dependence on renal dialysis: Secondary | ICD-10-CM | POA: Diagnosis not present

## 2021-04-07 DIAGNOSIS — N186 End stage renal disease: Secondary | ICD-10-CM | POA: Diagnosis not present

## 2021-04-07 DIAGNOSIS — R197 Diarrhea, unspecified: Secondary | ICD-10-CM | POA: Diagnosis not present

## 2021-04-07 DIAGNOSIS — N2581 Secondary hyperparathyroidism of renal origin: Secondary | ICD-10-CM | POA: Diagnosis not present

## 2021-04-07 DIAGNOSIS — R52 Pain, unspecified: Secondary | ICD-10-CM | POA: Diagnosis not present

## 2021-04-07 DIAGNOSIS — E1129 Type 2 diabetes mellitus with other diabetic kidney complication: Secondary | ICD-10-CM | POA: Diagnosis not present

## 2021-04-10 ENCOUNTER — Emergency Department (HOSPITAL_COMMUNITY): Payer: Medicare Other

## 2021-04-10 ENCOUNTER — Emergency Department (HOSPITAL_COMMUNITY)
Admission: EM | Admit: 2021-04-10 | Discharge: 2021-04-11 | Disposition: A | Discharge status: Home / Self Care | Payer: Medicare Other | Attending: Emergency Medicine | Admitting: Emergency Medicine

## 2021-04-10 ENCOUNTER — Other Ambulatory Visit: Payer: Self-pay

## 2021-04-10 DIAGNOSIS — Z20822 Contact with and (suspected) exposure to covid-19: Secondary | ICD-10-CM | POA: Diagnosis not present

## 2021-04-10 DIAGNOSIS — N2581 Secondary hyperparathyroidism of renal origin: Secondary | ICD-10-CM | POA: Diagnosis not present

## 2021-04-10 DIAGNOSIS — R1084 Generalized abdominal pain: Secondary | ICD-10-CM | POA: Diagnosis not present

## 2021-04-10 DIAGNOSIS — R531 Weakness: Secondary | ICD-10-CM | POA: Insufficient documentation

## 2021-04-10 DIAGNOSIS — R0602 Shortness of breath: Secondary | ICD-10-CM | POA: Diagnosis not present

## 2021-04-10 DIAGNOSIS — R059 Cough, unspecified: Secondary | ICD-10-CM | POA: Diagnosis not present

## 2021-04-10 DIAGNOSIS — R1110 Vomiting, unspecified: Secondary | ICD-10-CM | POA: Diagnosis not present

## 2021-04-10 DIAGNOSIS — Z992 Dependence on renal dialysis: Secondary | ICD-10-CM | POA: Diagnosis not present

## 2021-04-10 DIAGNOSIS — R112 Nausea with vomiting, unspecified: Secondary | ICD-10-CM | POA: Diagnosis not present

## 2021-04-10 DIAGNOSIS — N186 End stage renal disease: Secondary | ICD-10-CM | POA: Diagnosis not present

## 2021-04-10 DIAGNOSIS — R52 Pain, unspecified: Secondary | ICD-10-CM | POA: Diagnosis not present

## 2021-04-10 DIAGNOSIS — I10 Essential (primary) hypertension: Secondary | ICD-10-CM | POA: Diagnosis not present

## 2021-04-10 DIAGNOSIS — R0789 Other chest pain: Secondary | ICD-10-CM | POA: Diagnosis not present

## 2021-04-10 DIAGNOSIS — R079 Chest pain, unspecified: Secondary | ICD-10-CM | POA: Diagnosis not present

## 2021-04-10 DIAGNOSIS — R1111 Vomiting without nausea: Secondary | ICD-10-CM | POA: Diagnosis not present

## 2021-04-10 DIAGNOSIS — R197 Diarrhea, unspecified: Secondary | ICD-10-CM | POA: Diagnosis not present

## 2021-04-10 DIAGNOSIS — R9431 Abnormal electrocardiogram [ECG] [EKG]: Secondary | ICD-10-CM | POA: Diagnosis not present

## 2021-04-10 DIAGNOSIS — E1129 Type 2 diabetes mellitus with other diabetic kidney complication: Secondary | ICD-10-CM | POA: Diagnosis not present

## 2021-04-10 NOTE — ED Triage Notes (Addendum)
Pt BIB [facility] from home for NV.  Pt was dialyzed today and told his potassium was still high after dialysis.Marland Kitchen  He felt ok so chose to go home. NV and abd pain and light CP began about an hr ago.  Pt took PO zofran at home prior to EMS arrival and was ok on truck but began to dry eve on arrival to room.  Pt had tears running down his eyes and was a little diaphoretic when I entered room. He has been doing better since I began care.   EMS vitals: 166/72, 100%, 65 sinus rhythm, CBG 125

## 2021-04-11 ENCOUNTER — Encounter (HOSPITAL_COMMUNITY): Payer: Self-pay | Admitting: Emergency Medicine

## 2021-04-11 LAB — CBC WITH DIFFERENTIAL/PLATELET
Abs Immature Granulocytes: 0.02 10*3/uL (ref 0.00–0.07)
Basophils Absolute: 0.1 10*3/uL (ref 0.0–0.1)
Basophils Relative: 2 %
Eosinophils Absolute: 0.6 10*3/uL — ABNORMAL HIGH (ref 0.0–0.5)
Eosinophils Relative: 12 %
HCT: 41.7 % (ref 39.0–52.0)
Hemoglobin: 13.2 g/dL (ref 13.0–17.0)
Immature Granulocytes: 0 %
Lymphocytes Relative: 20 %
Lymphs Abs: 1 10*3/uL (ref 0.7–4.0)
MCH: 33.5 pg (ref 26.0–34.0)
MCHC: 31.7 g/dL (ref 30.0–36.0)
MCV: 105.8 fL — ABNORMAL HIGH (ref 80.0–100.0)
Monocytes Absolute: 0.5 10*3/uL (ref 0.1–1.0)
Monocytes Relative: 11 %
Neutro Abs: 2.6 10*3/uL (ref 1.7–7.7)
Neutrophils Relative %: 55 %
Platelets: 144 10*3/uL — ABNORMAL LOW (ref 150–400)
RBC: 3.94 MIL/uL — ABNORMAL LOW (ref 4.22–5.81)
RDW: 14 % (ref 11.5–15.5)
WBC: 4.8 10*3/uL (ref 4.0–10.5)
nRBC: 0 % (ref 0.0–0.2)

## 2021-04-11 LAB — I-STAT CHEM 8, ED
BUN: 33 mg/dL — ABNORMAL HIGH (ref 8–23)
Calcium, Ion: 0.93 mmol/L — ABNORMAL LOW (ref 1.15–1.40)
Chloride: 98 mmol/L (ref 98–111)
Creatinine, Ser: 8.4 mg/dL — ABNORMAL HIGH (ref 0.61–1.24)
Glucose, Bld: 117 mg/dL — ABNORMAL HIGH (ref 70–99)
HCT: 43 % (ref 39.0–52.0)
Hemoglobin: 14.6 g/dL (ref 13.0–17.0)
Potassium: 4.4 mmol/L (ref 3.5–5.1)
Sodium: 136 mmol/L (ref 135–145)
TCO2: 32 mmol/L (ref 22–32)

## 2021-04-11 LAB — TROPONIN I (HIGH SENSITIVITY)
Troponin I (High Sensitivity): 27 ng/L — ABNORMAL HIGH (ref <18)
Troponin I (High Sensitivity): 24 ng/L — ABNORMAL HIGH (ref <18)

## 2021-04-11 LAB — RESP PANEL BY RT-PCR (FLU A&B, COVID) ARPGX2
Influenza A by PCR: NEGATIVE
Influenza B by PCR: NEGATIVE
SARS Coronavirus 2 by RT PCR: NEGATIVE

## 2021-04-11 MED ORDER — ONDANSETRON HCL 4 MG/2ML IJ SOLN
4.0000 mg | Freq: Once | INTRAMUSCULAR | Status: AC
  Administered 2021-04-11: 4 mg via INTRAVENOUS
  Filled 2021-04-11: qty 2

## 2021-04-11 MED ORDER — GABAPENTIN 300 MG PO CAPS
300.0000 mg | ORAL_CAPSULE | ORAL | Status: AC
  Administered 2021-04-11: 300 mg via ORAL
  Filled 2021-04-11: qty 1

## 2021-04-11 MED ORDER — FAMOTIDINE IN NACL 20-0.9 MG/50ML-% IV SOLN
20.0000 mg | Freq: Once | INTRAVENOUS | Status: AC
Start: 2021-04-11 — End: 2021-04-11
  Administered 2021-04-11: 20 mg via INTRAVENOUS
  Filled 2021-04-11: qty 50
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
## 2021-04-11 NOTE — ED Notes (Signed)
Patient verbalizes understanding of discharge instructions. Opportunity for questioning and answers were provided. Armband removed by staff, pt discharged from ED via wheelchair to lobby to wait for wife to pick up. Pt refused emesis bag.

## 2021-04-11 NOTE — ED Notes (Signed)
PT able to take sips of water with Med without issue. PT refusing to drink or eat post pill administration. Pt states, " I am too scared to try. My stomach is still upset."

## 2021-04-12 DIAGNOSIS — N2581 Secondary hyperparathyroidism of renal origin: Secondary | ICD-10-CM | POA: Diagnosis not present

## 2021-04-12 DIAGNOSIS — E1129 Type 2 diabetes mellitus with other diabetic kidney complication: Secondary | ICD-10-CM | POA: Diagnosis not present

## 2021-04-12 DIAGNOSIS — Z992 Dependence on renal dialysis: Secondary | ICD-10-CM | POA: Diagnosis not present

## 2021-04-12 DIAGNOSIS — R197 Diarrhea, unspecified: Secondary | ICD-10-CM | POA: Diagnosis not present

## 2021-04-12 DIAGNOSIS — N186 End stage renal disease: Secondary | ICD-10-CM | POA: Diagnosis not present

## 2021-04-12 DIAGNOSIS — R52 Pain, unspecified: Secondary | ICD-10-CM | POA: Diagnosis not present

## 2021-04-14 DIAGNOSIS — Z992 Dependence on renal dialysis: Secondary | ICD-10-CM | POA: Diagnosis not present

## 2021-04-14 DIAGNOSIS — R197 Diarrhea, unspecified: Secondary | ICD-10-CM | POA: Diagnosis not present

## 2021-04-14 DIAGNOSIS — N186 End stage renal disease: Secondary | ICD-10-CM | POA: Diagnosis not present

## 2021-04-14 DIAGNOSIS — N2581 Secondary hyperparathyroidism of renal origin: Secondary | ICD-10-CM | POA: Diagnosis not present

## 2021-04-14 DIAGNOSIS — R52 Pain, unspecified: Secondary | ICD-10-CM | POA: Diagnosis not present

## 2021-04-14 DIAGNOSIS — E1129 Type 2 diabetes mellitus with other diabetic kidney complication: Secondary | ICD-10-CM | POA: Diagnosis not present

## 2021-04-17 DIAGNOSIS — E1129 Type 2 diabetes mellitus with other diabetic kidney complication: Secondary | ICD-10-CM | POA: Diagnosis not present

## 2021-04-17 DIAGNOSIS — R52 Pain, unspecified: Secondary | ICD-10-CM | POA: Diagnosis not present

## 2021-04-17 DIAGNOSIS — N186 End stage renal disease: Secondary | ICD-10-CM | POA: Diagnosis not present

## 2021-04-17 DIAGNOSIS — Z992 Dependence on renal dialysis: Secondary | ICD-10-CM | POA: Diagnosis not present

## 2021-04-17 DIAGNOSIS — N2581 Secondary hyperparathyroidism of renal origin: Secondary | ICD-10-CM | POA: Diagnosis not present

## 2021-04-17 DIAGNOSIS — R197 Diarrhea, unspecified: Secondary | ICD-10-CM | POA: Diagnosis not present

## 2021-04-17 NOTE — Progress Notes (Signed)
[facility] Note  04/20/2021     CHIEF COMPLAINT Patient presents for Retina Follow Up  HISTORY OF PRESENT ILLNESS: Jonathan Mcneil is a 66 y.o. male who presents to the clinic today for:   HPI     Retina Follow Up   Patient presents with  Diabetic Retinopathy.  In both eyes.  This started years ago.  Severity is moderate.  Duration of 4 weeks.  Since onset it is stable.  I, the attending physician,  performed the HPI with the patient and updated documentation appropriately.        Comments   67 y/o male pt here for 4 wk f/u for PDR OU.  No change in [place] OU.  Denies pain, floaters.  Sees an occasional temporal FOL OD while at the gym. No gtts.  BS 120 this a.m.  A1C 5.6.      Last edited by Bernarda Caffey, MD on 04/20/2021  1:45 PM.    Pt went to ER about 1 wk ago for stomach issues; they found nothing wrong. Pt reports persistent improvement in vision OU.  Referring physician: Marin [address], [place],  [place] [id number]  HISTORICAL INFORMATION:   Selected notes from the MEDICAL RECORD NUMBER Referred by Dr. Maryjane Hurter for heme OD   CURRENT MEDICATIONS: No current outpatient medications on file. (Ophthalmic Drugs)   No current facility-administered medications for this visit. (Ophthalmic Drugs)   Current Outpatient Medications (Other)  Medication Sig   acetaminophen (TYLENOL) 650 MG CR tablet Take 1,300 mg by mouth daily.   B Complex-C-Folic Acid (DIALYVITE PO) Take 1 tablet by mouth daily.   gabapentin (NEURONTIN) 300 MG capsule Take 300 mg by mouth at bedtime.   loperamide (IMODIUM A-D) 2 MG tablet Take 1 tablet (2 mg total) by mouth 4 (four) times daily as needed for diarrhea or loose stools.   ondansetron (ZOFRAN) 8 MG tablet Take 8 mg by mouth every 8 (eight) hours as needed for vomiting or nausea.   sertraline (ZOLOFT) 25 MG tablet Take 25 mg by mouth every Monday, Wednesday, and Friday.   sucroferric oxyhydroxide  (VELPHORO) 500 MG chewable tablet Chew 2,000 mg by mouth daily after breakfast.   No current facility-administered medications for this visit. (Other)   REVIEW OF SYSTEMS: ROS   Positive for: Gastrointestinal, Neurological, Genitourinary, Endocrine, Eyes Negative for: Constitutional, Skin, Musculoskeletal, HENT, Cardiovascular, Respiratory, Psychiatric, Allergic/Imm, Heme/Lymph Last edited by Matthew Folks, COA on 04/20/2021 10:04 AM.     ALLERGIES Allergies  Allergen Reactions   Ibuprofen Other (See Comments)    Unknown reaction Other reaction(s): hives   Penicillin G     Other reaction(s): hives   Penicillins Itching and Other (See Comments)    Has patient had a PCN reaction causing immediate rash, facial/tongue/throat swelling, SOB or lightheadedness with hypotension: No Has patient had a PCN reaction causing severe rash involving mucus membranes or skin necrosis: No Has patient had a PCN reaction that required hospitalization No Has patient had a PCN reaction occurring within the last 10 years: Unknown If all of the above answers are "NO", then may proceed with Cephalosporin use.   Venlafaxine     Other reaction(s): insomnia   PAST MEDICAL HISTORY Past Medical History:  Diagnosis Date   Cataract    Diabetes mellitus without complication ([place])    Diabetic retinopathy ([place])    ESRD (end stage renal disease) ([place])    GERD (  gastroesophageal reflux disease)    PMH   Hypertension    Hypertensive retinopathy    Low back pain    Metabolic acidosis    Neuromuscular disorder ([place])    peripheral neuropathy   Pancreatitis    Schizophrenia ([place])    does not take medications   Past Surgical History:  Procedure Laterality Date   A/V FISTULAGRAM Left 06/03/2018   Procedure: A/V FISTULAGRAM;  Surgeon: Marty Heck, MD;  Location: [facility];  Service: Cardiovascular;  Laterality: Left;   AV FISTULA PLACEMENT Left 02/11/2018   Procedure: INSERTION OF  ARTERIOVENOUS (AV) FISTULA LEFT  ARM;  Surgeon: [name], MD;  Location: [facility];  Service: Vascular;  Laterality: Left;   [place] Left 04/17/2018   Procedure: BASILIC VEIN TRANSPOSITION SECOND STAGE;  Surgeon: [name], MD;  Location: [facility];  Service: Vascular;  Laterality: Left;   BIOPSY  10/20/2019   Procedure: BIOPSY;  Surgeon: Clarene Essex, MD;  Location: [facility];  Service: Endoscopy;;   COLONOSCOPY     DENTAL SURGERY     ESOPHAGOGASTRODUODENOSCOPY (EGD) WITH PROPOFOL N/A 10/20/2019   Procedure: ESOPHAGOGASTRODUODENOSCOPY (EGD) WITH PROPOFOL;  Surgeon: Clarene Essex, MD;  Location: [facility];  Service: Endoscopy;  Laterality: N/A;   INSERTION OF DIALYSIS CATHETER Right 02/25/2018   Procedure: INSERTION OF DIALYSIS CATHETER;  Surgeon: [name], MD;  Location: [facility];  Service: Vascular;  Laterality: Right;   PERIPHERAL VASCULAR BALLOON ANGIOPLASTY  06/03/2018   Procedure: PERIPHERAL VASCULAR BALLOON ANGIOPLASTY;  Surgeon: Marty Heck, MD;  Location: [facility];  Service: Cardiovascular;;  left a/v fistula   FAMILY HISTORY Family History  Problem Relation Age of Onset   Kidney failure Mother    Diabetes Father    Blindness Brother    SOCIAL HISTORY Social History   Tobacco Use   Smoking status: Former   Smokeless tobacco: Never  Scientific laboratory technician Use: Never used  Substance Use Topics   Alcohol use: No   Drug use: Not Currently    Types: Cocaine, Marijuana    Comment: smoked marijuana a few days ago; he denies using cocaine       OPHTHALMIC EXAM:  Base Eye Exam     Visual Acuity (Snellen - Linear)       Right Left   Dist [place] 20/25 -2 20/20 -2   Dist ph [place] NI          Tonometry (Tonopen, 10:07 AM)       Right Left   Pressure 14 16         Pupils       Dark Light Shape React APD   Right 3 2 Round Brisk None   Left 3 2 Round Brisk None         Visual Fields (Counting  fingers)       Left Right    Full Full         Extraocular Movement       Right Left    Full, Ortho Full, Ortho         Neuro/Psych     Oriented x3: Yes   Mood/Affect: Normal         Dilation     Both eyes: 1.0% Mydriacyl, 2.5% Phenylephrine @ 10:07 AM           Slit Lamp and Fundus Exam     Slit Lamp Exam  Right Left   Lids/Lashes Dermatochalasis - upper lid Dermatochalasis - upper lid   Conjunctiva/Sclera Melanosis Melanosis, temporal pinguecula   Cornea 1+Punctate epithelial erosions, mild arcus, mild tear film debris 1+Punctate epithelial erosions, mild arcus   Anterior Chamber Deep and quiet Deep and quiet   Iris Round and dilated, No NVI Round and poorly dilated, No NVI   Lens 2-3+ Nuclear sclerosis, 2-3+ Cortical cataract 2-3+ Nuclear sclerosis, 2-3+ Cortical cataract   Anterior Vitreous Vitreous syneresis, diffuse VH- improved, residual blood stained vitreous condensations settled inferiorly--turning white Vitreous syneresis, blood stained vitreous condensations - improved, mild blood clots settling inferiorly         Fundus Exam       Right Left   Disc Mild Pallor, Sharp rim, focal PPP/PPA Mild Pallor, Sharp rim   C/D Ratio 0.4 0.6   Macula Flat, Good foveal reflex, RPE mottling and clumping, no edema, trace MA Flat, good foveal reflex, ERM, RPE mottling and clumping, scattered Microaneurysms/DBH greatest temporal macula   Vessels attenuated, mild Tortuousity, mild Copper wiring, nasal NV improved/regressing mild attenuation, mild Copper wiring, IN NV   Periphery Attached, 360 IRH/DBH - improved, pre-retinal heme settled inferiorly, red blood clots inferiorly turning white, 360 PRP with room for fill in. Attached, scattered IRH/DBH greatest nasally, good 360 PRP changes, red blood clots inferiorly turning white, No RT/RD           IMAGING AND PROCEDURES  Imaging and Procedures for 04/20/2021  OCT, Retina - OU - Both Eyes       Right  Eye Quality was good. Central Foveal Thickness: 246. Progression has improved. Findings include no IRF, no SRF, normal foveal contour, intraretinal hyper-reflective material (Mild interval improvement in vitreous opacities).   Left Eye Quality was good. Central Foveal Thickness: 282. Progression has been stable. Findings include normal foveal contour, no SRF, intraretinal fluid (Stable improvement in vitreous opacities; persistent cystic changes temporal macula, ERM with blunted foveal contour).   Notes *Images captured and stored on drive  Diagnosis / Impression:  OD: Mild interval improvement in vitreous opacities OS: Stable improvement in vitreous opacities; persistent cystic changes temporal macula, ERM with blunted foveal contour  Clinical management:  See below  Abbreviations: NFP - Normal foveal profile. CME - cystoid macular edema. PED - pigment epithelial detachment. IRF - intraretinal fluid. SRF - subretinal fluid. EZ - ellipsoid zone. ERM - epiretinal membrane. ORA - outer retinal atrophy. ORT - outer retinal tubulation. SRHM - subretinal hyper-reflective material. IRHM - intraretinal hyper-reflective material      Intravitreal Injection, Pharmacologic Agent - OD - Right Eye       Time Out 04/20/2021. 11:08 AM. Confirmed correct patient, procedure, site, and patient consented.   Anesthesia Topical anesthesia was used. Anesthetic medications included Lidocaine 2%, Proparacaine 0.5%.   Procedure Preparation included 5% betadine to ocular surface, eyelid speculum. A supplied needle was used.   Injection: 1.25 mg Bevacizumab 1.25mg /0.81ml   Route: Intravitreal, Site: Right Eye   NDC: H061816, Lot: [id number] , Expiration date: 06/06/2021, Waste: 0 mL   Post-op Post injection exam found visual acuity of at least counting fingers. The patient tolerated the procedure well. There were no complications. The patient received written and verbal post procedure care education.  Post injection medications were not given.               ASSESSMENT/PLAN:   ICD-10-CM   1. Vitreous hemorrhage, right eye (HCC)  H43.11 Intravitreal Injection, Pharmacologic Agent - OD - Right Eye  Bevacizumab (AVASTIN) SOLN 1.25 mg    2. Proliferative diabetic retinopathy of both eyes with macular edema associated with type 2 diabetes mellitus (HCC)  E11.3513 OCT, Retina - OU - Both Eyes    Intravitreal Injection, Pharmacologic Agent - OD - Right Eye    Bevacizumab (AVASTIN) SOLN 1.25 mg    3. Vitreous hemorrhage of left eye (HCC)  H43.12     4. Posterior vitreous detachment of left eye  H43.812     5. Essential hypertension  I10     6. Hypertensive retinopathy of both eyes  H35.033     7. Combined forms of age-related cataract of both eyes  H25.813      1-3. Proliferative diabetic retinopathy w/ recurrent VH OU (OD > OS) - s/p IVA OD #1 (12.08.21), #2 (01.11.22), #3 (02.09.22), #4 (11.04.22), #5 (12.02.22), #6 (12.30.22) - s/p IVA OS #1 (12.10.21), #2 (01.11.22), #3 (02.09.22), #4 (09.09.22), #5 (10.07.22), #6 (11.04.22), #7 (12.02.22), #8 (12.30.22) - FA (12.08.21) shows late-leaking MA, vascular nonperfusion, +NV OU (nasal midzone) - s/p PRP OD (01.26.22) - s/p PRP OS (03.09.22) - BCVA 20/25 OD (improved), 20/20 OS (stable) - exam shows interval improvement in VH OD; OS with blood stained vit condensations improving from hemorrhagic PVD, and blood clots settled inferiorly  - OCT shows OD: mild interval improvement in vitreous opacities; OS: stable improvement in vitreous opacities; persistent cystic changes temporal macula, ERM with blunted foveal contour  - repeat FA 6.6.22 shows interval improvement in NVE/leakage OU s/p PRP OU-- just minimal leakage remains   - recommend holding heparin at dialysis if able--gave note for patient to take to dialysis on 12/23/2020  - recommend IVA OD #7 today, 01.27.23 for persistent VH; will hold off on OS  - pt wishes to proceed  -  RBA of procedure discussed, questions answered - informed consent obtained and signed - see procedure note - f/u 4 weeks, DFE, OCT, possible injections  4. Hemorrhagic PVD OS  - Onset mid-August 2022 w/ new onset floaters, no photopsias  - s/p IVA OS #4 (09.09.22), #5 (10.07.22), #6 (11.04.22), #7 (12.02.22), #8 (12.30.22) - today, VA stably improved to 20/20 OS from 20/40 and 20/80 prior - pt states they have been holding heparin at dialysis - exam shows interval improvement in vitreous heme -- clearing and settling inferiorly - pt reports previously receiving heparin during dialysis (T, Th, Sat) -- may have contributed to interval worsening  - Discussed findings and prognosis  - No RT or RD on 360 peripheral exam  - Reviewed s/s of RT/RD  - Strict return precautions for any such RT/RD signs/symptoms  - VH precautions reviewed -- minimize activities, keep head elevated, hold heparin if able, avoid ASA/NSAIDS  5,6. Hypertensive retinopathy OU - discussed importance of tight BP control - monitor   7. Mixed Cataract OU - The symptoms of cataract, surgical options, and treatments and risks were discussed with patient - discussed diagnosis and progression - approaching visual significance - monitor for now  Ophthalmic Meds Ordered this visit:  Meds ordered this encounter  Medications   Bevacizumab (AVASTIN) SOLN 1.25 mg     Return in about 4 weeks (around 05/18/2021) for PDR OU w/DFE/OCT/poss. IVA.  There are no Patient Instructions on file for this visit.  This document serves as a record of services personally performed by Gardiner Sleeper, MD, PhD. It was created on their behalf by Leonie Douglas, an ophthalmic technician. The creation of this record is the provider's dictation  and/or activities during the visit.    Electronically signed by: Leonie Douglas COA, 04/20/21  1:51 PM  This document serves as a record of services personally performed by Gardiner Sleeper, MD, PhD. It was  created on their behalf by San Jetty. Owens Shark, OA an ophthalmic technician. The creation of this record is the provider's dictation and/or activities during the visit.    Electronically signed by: San Jetty. Owens Shark, [place] 01.27.2023 1:51 PM  This document serves as a record of services personally performed by Gardiner Sleeper, MD, PhD. It was created on their behalf by Estill Bakes, COT an ophthalmic technician. The creation of this record is the provider's dictation and/or activities during the visit.    Electronically signed by: Estill Bakes, COT 1.27.23 @ 1:51 PM   Gardiner Sleeper, M.D., Ph.D. Diseases & Surgery of the Retina and [place] 04/20/2021  I have reviewed the above documentation for accuracy and completeness, and I agree with the above. Gardiner Sleeper, M.D., Ph.D. 04/20/21 1:53 PM   Abbreviations: M myopia (nearsighted); A astigmatism; H hyperopia (farsighted); P presbyopia; Mrx spectacle prescription;  CTL contact lenses; OD right eye; OS left eye; OU both eyes  XT exotropia; ET esotropia; PEK punctate epithelial keratitis; PEE punctate epithelial erosions; DES dry eye syndrome; MGD meibomian gland dysfunction; ATs artificial tears; PFAT's preservative free artificial tears; [place] nuclear sclerotic cataract; PSC posterior subcapsular cataract; ERM epi-retinal membrane; PVD posterior vitreous detachment; RD retinal detachment; DM diabetes mellitus; DR diabetic retinopathy; NPDR non-proliferative diabetic retinopathy; PDR proliferative diabetic retinopathy; CSME clinically significant macular edema; DME diabetic macular edema; dbh dot blot hemorrhages; CWS cotton wool spot; POAG primary open angle glaucoma; C/D cup-to-disc ratio; HVF humphrey visual field; GVF goldmann visual field; OCT optical coherence tomography; IOP intraocular pressure; BRVO Branch retinal vein occlusion; CRVO central retinal vein occlusion; CRAO central retinal artery occlusion; BRAO  branch retinal artery occlusion; RT retinal tear; SB scleral buckle; PPV pars plana vitrectomy; VH Vitreous hemorrhage; PRP panretinal laser photocoagulation; IVK intravitreal kenalog; VMT vitreomacular traction; MH Macular hole;  NVD neovascularization of the disc; NVE neovascularization elsewhere; AREDS age related eye disease study; ARMD age related macular degeneration; POAG primary open angle glaucoma; EBMD epithelial/anterior basement membrane dystrophy; ACIOL anterior chamber intraocular lens; IOL intraocular lens; PCIOL posterior chamber intraocular lens; Phaco/IOL phacoemulsification with intraocular lens placement; [place] photorefractive keratectomy; LASIK laser assisted in situ keratomileusis; HTN hypertension; DM diabetes mellitus; COPD chronic obstructive pulmonary disease

## 2021-04-18 DIAGNOSIS — N186 End stage renal disease: Secondary | ICD-10-CM | POA: Diagnosis not present

## 2021-04-19 DIAGNOSIS — N186 End stage renal disease: Secondary | ICD-10-CM | POA: Diagnosis not present

## 2021-04-19 DIAGNOSIS — N2581 Secondary hyperparathyroidism of renal origin: Secondary | ICD-10-CM | POA: Diagnosis not present

## 2021-04-19 DIAGNOSIS — Z992 Dependence on renal dialysis: Secondary | ICD-10-CM | POA: Diagnosis not present

## 2021-04-19 DIAGNOSIS — R197 Diarrhea, unspecified: Secondary | ICD-10-CM | POA: Diagnosis not present

## 2021-04-19 DIAGNOSIS — R52 Pain, unspecified: Secondary | ICD-10-CM | POA: Diagnosis not present

## 2021-04-19 DIAGNOSIS — E1129 Type 2 diabetes mellitus with other diabetic kidney complication: Secondary | ICD-10-CM | POA: Diagnosis not present

## 2021-04-20 ENCOUNTER — Encounter (INDEPENDENT_AMBULATORY_CARE_PROVIDER_SITE_OTHER): Payer: Self-pay | Admitting: Ophthalmology

## 2021-04-20 ENCOUNTER — Other Ambulatory Visit: Payer: Self-pay

## 2021-04-20 ENCOUNTER — Ambulatory Visit (INDEPENDENT_AMBULATORY_CARE_PROVIDER_SITE_OTHER): Payer: Medicare Other | Admitting: Ophthalmology

## 2021-04-20 DIAGNOSIS — E113513 Type 2 diabetes mellitus with proliferative diabetic retinopathy with macular edema, bilateral: Secondary | ICD-10-CM

## 2021-04-20 DIAGNOSIS — I10 Essential (primary) hypertension: Secondary | ICD-10-CM | POA: Diagnosis not present

## 2021-04-20 DIAGNOSIS — H4312 Vitreous hemorrhage, left eye: Secondary | ICD-10-CM

## 2021-04-20 DIAGNOSIS — H4311 Vitreous hemorrhage, right eye: Secondary | ICD-10-CM

## 2021-04-20 DIAGNOSIS — H25813 Combined forms of age-related cataract, bilateral: Secondary | ICD-10-CM

## 2021-04-20 DIAGNOSIS — H4313 Vitreous hemorrhage, bilateral: Secondary | ICD-10-CM | POA: Diagnosis not present

## 2021-04-20 DIAGNOSIS — H43812 Vitreous degeneration, left eye: Secondary | ICD-10-CM | POA: Diagnosis not present

## 2021-04-20 DIAGNOSIS — H35033 Hypertensive retinopathy, bilateral: Secondary | ICD-10-CM

## 2021-04-20 MED ORDER — BEVACIZUMAB CHEMO INJECTION 1.25MG/0.05ML SYRINGE FOR KALEIDOSCOPE
1.2500 mg | INTRAVITREAL | Status: AC | PRN
  Administered 2021-04-20: 1.25 mg via INTRAVITREAL

## 2021-04-21 DIAGNOSIS — R52 Pain, unspecified: Secondary | ICD-10-CM | POA: Diagnosis not present

## 2021-04-21 DIAGNOSIS — N186 End stage renal disease: Secondary | ICD-10-CM | POA: Diagnosis not present

## 2021-04-21 DIAGNOSIS — N2581 Secondary hyperparathyroidism of renal origin: Secondary | ICD-10-CM | POA: Diagnosis not present

## 2021-04-21 DIAGNOSIS — Z992 Dependence on renal dialysis: Secondary | ICD-10-CM | POA: Diagnosis not present

## 2021-04-21 DIAGNOSIS — E1129 Type 2 diabetes mellitus with other diabetic kidney complication: Secondary | ICD-10-CM | POA: Diagnosis not present

## 2021-04-21 DIAGNOSIS — R197 Diarrhea, unspecified: Secondary | ICD-10-CM | POA: Diagnosis not present

## 2021-04-24 DIAGNOSIS — E782 Mixed hyperlipidemia: Secondary | ICD-10-CM | POA: Diagnosis not present

## 2021-04-24 DIAGNOSIS — N2581 Secondary hyperparathyroidism of renal origin: Secondary | ICD-10-CM | POA: Diagnosis not present

## 2021-04-24 DIAGNOSIS — N186 End stage renal disease: Secondary | ICD-10-CM | POA: Diagnosis not present

## 2021-04-24 DIAGNOSIS — E1022 Type 1 diabetes mellitus with diabetic chronic kidney disease: Secondary | ICD-10-CM | POA: Diagnosis not present

## 2021-04-24 DIAGNOSIS — R197 Diarrhea, unspecified: Secondary | ICD-10-CM | POA: Diagnosis not present

## 2021-04-24 DIAGNOSIS — E1129 Type 2 diabetes mellitus with other diabetic kidney complication: Secondary | ICD-10-CM | POA: Diagnosis not present

## 2021-04-24 DIAGNOSIS — E1122 Type 2 diabetes mellitus with diabetic chronic kidney disease: Secondary | ICD-10-CM | POA: Diagnosis not present

## 2021-04-24 DIAGNOSIS — Z992 Dependence on renal dialysis: Secondary | ICD-10-CM | POA: Diagnosis not present

## 2021-04-24 DIAGNOSIS — R52 Pain, unspecified: Secondary | ICD-10-CM | POA: Diagnosis not present

## 2021-04-24 DIAGNOSIS — I120 Hypertensive chronic kidney disease with stage 5 chronic kidney disease or end stage renal disease: Secondary | ICD-10-CM | POA: Diagnosis not present

## 2021-04-24 DIAGNOSIS — N185 Chronic kidney disease, stage 5: Secondary | ICD-10-CM | POA: Diagnosis not present

## 2021-04-26 DIAGNOSIS — Z992 Dependence on renal dialysis: Secondary | ICD-10-CM | POA: Diagnosis not present

## 2021-04-26 DIAGNOSIS — L299 Pruritus, unspecified: Secondary | ICD-10-CM | POA: Diagnosis not present

## 2021-04-26 DIAGNOSIS — R52 Pain, unspecified: Secondary | ICD-10-CM | POA: Diagnosis not present

## 2021-04-26 DIAGNOSIS — N2581 Secondary hyperparathyroidism of renal origin: Secondary | ICD-10-CM | POA: Diagnosis not present

## 2021-04-26 DIAGNOSIS — N186 End stage renal disease: Secondary | ICD-10-CM | POA: Diagnosis not present

## 2021-04-28 DIAGNOSIS — N186 End stage renal disease: Secondary | ICD-10-CM | POA: Diagnosis not present

## 2021-04-28 DIAGNOSIS — Z992 Dependence on renal dialysis: Secondary | ICD-10-CM | POA: Diagnosis not present

## 2021-04-28 DIAGNOSIS — R52 Pain, unspecified: Secondary | ICD-10-CM | POA: Diagnosis not present

## 2021-04-28 DIAGNOSIS — N2581 Secondary hyperparathyroidism of renal origin: Secondary | ICD-10-CM | POA: Diagnosis not present

## 2021-04-28 DIAGNOSIS — L299 Pruritus, unspecified: Secondary | ICD-10-CM | POA: Diagnosis not present

## 2021-04-30 DIAGNOSIS — R9439 Abnormal result of other cardiovascular function study: Secondary | ICD-10-CM | POA: Diagnosis not present

## 2021-04-30 DIAGNOSIS — N186 End stage renal disease: Secondary | ICD-10-CM | POA: Diagnosis not present

## 2021-04-30 DIAGNOSIS — Z01810 Encounter for preprocedural cardiovascular examination: Secondary | ICD-10-CM | POA: Diagnosis not present

## 2021-04-30 DIAGNOSIS — E1122 Type 2 diabetes mellitus with diabetic chronic kidney disease: Secondary | ICD-10-CM | POA: Diagnosis not present

## 2021-05-01 DIAGNOSIS — L299 Pruritus, unspecified: Secondary | ICD-10-CM | POA: Diagnosis not present

## 2021-05-01 DIAGNOSIS — Z992 Dependence on renal dialysis: Secondary | ICD-10-CM | POA: Diagnosis not present

## 2021-05-01 DIAGNOSIS — R52 Pain, unspecified: Secondary | ICD-10-CM | POA: Diagnosis not present

## 2021-05-01 DIAGNOSIS — N2581 Secondary hyperparathyroidism of renal origin: Secondary | ICD-10-CM | POA: Diagnosis not present

## 2021-05-01 DIAGNOSIS — N186 End stage renal disease: Secondary | ICD-10-CM | POA: Diagnosis not present

## 2021-05-03 DIAGNOSIS — N2581 Secondary hyperparathyroidism of renal origin: Secondary | ICD-10-CM | POA: Diagnosis not present

## 2021-05-03 DIAGNOSIS — Z992 Dependence on renal dialysis: Secondary | ICD-10-CM | POA: Diagnosis not present

## 2021-05-03 DIAGNOSIS — L299 Pruritus, unspecified: Secondary | ICD-10-CM | POA: Diagnosis not present

## 2021-05-03 DIAGNOSIS — N186 End stage renal disease: Secondary | ICD-10-CM | POA: Diagnosis not present

## 2021-05-03 DIAGNOSIS — R52 Pain, unspecified: Secondary | ICD-10-CM | POA: Diagnosis not present

## 2021-05-05 DIAGNOSIS — Z992 Dependence on renal dialysis: Secondary | ICD-10-CM | POA: Diagnosis not present

## 2021-05-05 DIAGNOSIS — R52 Pain, unspecified: Secondary | ICD-10-CM | POA: Diagnosis not present

## 2021-05-05 DIAGNOSIS — L299 Pruritus, unspecified: Secondary | ICD-10-CM | POA: Diagnosis not present

## 2021-05-05 DIAGNOSIS — N186 End stage renal disease: Secondary | ICD-10-CM | POA: Diagnosis not present

## 2021-05-05 DIAGNOSIS — N2581 Secondary hyperparathyroidism of renal origin: Secondary | ICD-10-CM | POA: Diagnosis not present

## 2021-05-08 DIAGNOSIS — Z992 Dependence on renal dialysis: Secondary | ICD-10-CM | POA: Diagnosis not present

## 2021-05-08 DIAGNOSIS — N2581 Secondary hyperparathyroidism of renal origin: Secondary | ICD-10-CM | POA: Diagnosis not present

## 2021-05-08 DIAGNOSIS — R52 Pain, unspecified: Secondary | ICD-10-CM | POA: Diagnosis not present

## 2021-05-08 DIAGNOSIS — N186 End stage renal disease: Secondary | ICD-10-CM | POA: Diagnosis not present

## 2021-05-08 DIAGNOSIS — L299 Pruritus, unspecified: Secondary | ICD-10-CM | POA: Diagnosis not present

## 2021-05-10 DIAGNOSIS — R52 Pain, unspecified: Secondary | ICD-10-CM | POA: Diagnosis not present

## 2021-05-10 DIAGNOSIS — L299 Pruritus, unspecified: Secondary | ICD-10-CM | POA: Diagnosis not present

## 2021-05-10 DIAGNOSIS — N2581 Secondary hyperparathyroidism of renal origin: Secondary | ICD-10-CM | POA: Diagnosis not present

## 2021-05-10 DIAGNOSIS — N186 End stage renal disease: Secondary | ICD-10-CM | POA: Diagnosis not present

## 2021-05-10 DIAGNOSIS — Z992 Dependence on renal dialysis: Secondary | ICD-10-CM | POA: Diagnosis not present

## 2021-05-11 NOTE — Progress Notes (Signed)
[facility] Note  05/18/2021     CHIEF COMPLAINT Patient presents for Retina Follow Up  HISTORY OF PRESENT ILLNESS: Jonathan Mcneil is a 66 y.o. male who presents to the clinic today for:   HPI     Retina Follow Up   Patient presents with  Diabetic Retinopathy.  In both eyes.  Severity is moderate.  Duration of 4 weeks.  Since onset it is gradually improving.  I, the attending physician,  performed the HPI with the patient and updated documentation appropriately.        Comments   Pt here for 4 wk ret f/u for [facility] OD/NPDR OU. Pt states he has seen an improvement in vision OU, specifically OD.       Last edited by Bernarda Caffey, MD on 05/21/2021  4:22 PM.    Pt states visions is doing well  Referring physician: Merrilee Seashore, [address],  [place] [address]  HISTORICAL INFORMATION:   Selected notes from the MEDICAL RECORD NUMBER Referred by Dr. Maryjane Hurter for heme OD   CURRENT MEDICATIONS: No current outpatient medications on file. (Ophthalmic Drugs)   No current facility-administered medications for this visit. (Ophthalmic Drugs)   Current Outpatient Medications (Other)  Medication Sig   acetaminophen (TYLENOL) 650 MG CR tablet Take 1,300 mg by mouth daily.   B Complex-C-Folic Acid (DIALYVITE PO) Take 1 tablet by mouth daily.   gabapentin (NEURONTIN) 300 MG capsule Take 300 mg by mouth at bedtime.   loperamide (IMODIUM A-D) 2 MG tablet Take 1 tablet (2 mg total) by mouth 4 (four) times daily as needed for diarrhea or loose stools.   ondansetron (ZOFRAN) 8 MG tablet Take 8 mg by mouth every 8 (eight) hours as needed for vomiting or nausea.   sertraline (ZOLOFT) 25 MG tablet Take 25 mg by mouth every Monday, Wednesday, and Friday.   sucroferric oxyhydroxide (VELPHORO) 500 MG chewable tablet Chew 2,000 mg by mouth daily after breakfast.   No current facility-administered medications for this visit. (Other)    REVIEW OF SYSTEMS: ROS   Positive for: Gastrointestinal, Neurological, Genitourinary, Endocrine, Eyes Negative for: Constitutional, Skin, Musculoskeletal, HENT, Cardiovascular, Respiratory, Psychiatric, Allergic/Imm, Heme/Lymph Last edited by Kingsley Spittle, COT on 05/18/2021  8:59 AM.     ALLERGIES Allergies  Allergen Reactions   Ibuprofen Other (See Comments)    Unknown reaction Other reaction(s): hives   Penicillin G     Other reaction(s): hives   Penicillins Itching and Other (See Comments)    Has patient had a PCN reaction causing immediate rash, facial/tongue/throat swelling, SOB or lightheadedness with hypotension: No Has patient had a PCN reaction causing severe rash involving mucus membranes or skin necrosis: No Has patient had a PCN reaction that required hospitalization No Has patient had a PCN reaction occurring within the last 10 years: Unknown If all of the above answers are "NO", then may proceed with Cephalosporin use.   Venlafaxine     Other reaction(s): insomnia   PAST MEDICAL HISTORY Past Medical History:  Diagnosis Date   Cataract    Diabetes mellitus without complication ([place])    Diabetic retinopathy ([place])    ESRD (end stage renal disease) ([place])    GERD (gastroesophageal reflux disease)    PMH   Hypertension    Hypertensive retinopathy    Low back pain    Metabolic acidosis    Neuromuscular disorder ([place])    peripheral neuropathy  Pancreatitis    Schizophrenia (HCC)    does not take medications   Past Surgical History:  Procedure Laterality Date   A/V FISTULAGRAM Left 06/03/2018   Procedure: A/V FISTULAGRAM;  Surgeon: Cephus Shelling, MD;  Location: [facility] INVASIVE CV LAB;  Service: Cardiovascular;  Laterality: Left;   AV FISTULA PLACEMENT Left 02/11/2018   Procedure: INSERTION OF ARTERIOVENOUS (AV) FISTULA LEFT  ARM;  Surgeon: Maeola Harman, MD;  [facility] OR;  Service: Vascular;  Laterality: Left;   BASCILIC VEIN  TRANSPOSITION Left 04/17/2018   Procedure: BASILIC VEIN TRANSPOSITION SECOND STAGE;  Surgeon: Maeola Harman, MD;  [facility] OR;  Service: Vascular;  Laterality: Left;   BIOPSY  10/20/2019   Procedure: BIOPSY;  Surgeon: Vida Rigger, MD;  Location: [facility] ENDOSCOPY;  Service: Endoscopy;;   COLONOSCOPY     DENTAL SURGERY     ESOPHAGOGASTRODUODENOSCOPY (EGD) WITH PROPOFOL N/A 10/20/2019   Procedure: ESOPHAGOGASTRODUODENOSCOPY (EGD) WITH PROPOFOL;  Surgeon: Vida Rigger, MD;  Location: [facility] ENDOSCOPY;  Service: Endoscopy;  Laterality: N/A;   INSERTION OF DIALYSIS CATHETER Right 02/25/2018   Procedure: INSERTION OF DIALYSIS CATHETER;  Surgeon: Maeola Harman, MD;  Location: [facility] OR;  Service: Vascular;  Laterality: Right;   PERIPHERAL VASCULAR BALLOON ANGIOPLASTY  06/03/2018   Procedure: PERIPHERAL VASCULAR BALLOON ANGIOPLASTY;  Surgeon: Cephus Shelling, MD;  Location: [facility] INVASIVE CV LAB;  Service: Cardiovascular;;  left a/v fistula   FAMILY HISTORY Family History  Problem Relation Age of Onset   Kidney failure Mother    Diabetes Father    Blindness Brother    SOCIAL HISTORY Social History   Tobacco Use   Smoking status: Former   Smokeless tobacco: Never  Building services engineer Use: Never used  Substance Use Topics   Alcohol use: No   Drug use: Not Currently    Types: Cocaine, Marijuana    Comment: smoked marijuana a few days ago; he denies using cocaine       OPHTHALMIC EXAM:  Base Eye Exam     Visual Acuity (Snellen - Linear)       Right Left   Dist Pilger 20/25 +2 20/20         Tonometry (Tonopen, 9:05 AM)       Right Left   Pressure 18 17         Pupils       Dark Light Shape React APD   Right 3 2 Round Brisk None   Left 3 2 Round Brisk None         Visual Fields (Counting fingers)       Left Right    Full Full         Extraocular Movement       Right Left    Full, Ortho Full, Ortho         Neuro/Psych     Oriented x3: Yes    Mood/Affect: Normal         Dilation     Both eyes: 1.0% Mydriacyl @ 9:06 AM           Slit Lamp and Fundus Exam     Slit Lamp Exam       Right Left   Lids/Lashes Dermatochalasis - upper lid Dermatochalasis - upper lid   Conjunctiva/Sclera Melanosis Melanosis, temporal pinguecula   Cornea 1+Punctate epithelial erosions, mild arcus, mild tear film debris 1+Punctate epithelial erosions, mild arcus   Anterior Chamber Deep and quiet Deep and quiet  Iris Round and dilated, No NVI Round and poorly dilated, No NVI   Lens 2-3+ Nuclear sclerosis, 2-3+ Cortical cataract 2-3+ Nuclear sclerosis, 2-3+ Cortical cataract   Anterior Vitreous Vitreous syneresis, diffuse VH- improved, residual blood stained vitreous condensations settled inferiorly--turning white Vitreous syneresis, blood stained vitreous condensations - improved, mild blood clots settling inferiorly and improving         Fundus Exam       Right Left   Disc Mild Pallor, Sharp rim, focal PPP/PPA Mild Pallor, Sharp rim   C/D Ratio 0.4 0.6   Macula Flat, Good foveal reflex, RPE mottling and clumping, no edema, trace MA, mild ERM Flat, good foveal reflex, ERM, RPE mottling and clumping, scattered Microaneurysms/DBH greatest temporal macula   Vessels attenuated, mild Tortuousity, mild Copper wiring, nasal NV improved/regressing mild attenuation, mild Copper wiring, IN NV   Periphery Attached, 360 IRH/DBH - improved, pre-retinal heme settled inferiorly, red blood clots inferiorly turning white, 360 PRP with room for fill in. Attached, scattered IRH/DBH greatest nasally, good 360 PRP changes, red blood clots inferiorly turning white, No RT/RD           IMAGING AND PROCEDURES  Imaging and Procedures for 05/18/2021  OCT, Retina - OU - Both Eyes       Right Eye Quality was good. Central Foveal Thickness: 244. Progression has been stable. Findings include no IRF, no SRF, normal foveal contour, intraretinal hyper-reflective  material (Trace vitreous opacities, irregular lamination, mild diffuse thinning).   Left Eye Quality was good. Central Foveal Thickness: 292. Progression has been stable. Findings include normal foveal contour, no SRF, epiretinal membrane, no IRF (Stable improvement in vitreous opacities; persistent cystic changes temporal macula - improved, ERM with blunted foveal contour).   Notes *Images captured and stored on drive  Diagnosis / Impression:  OD: Trace vitreous opacities, irregular lamination, mild diffuse thinning OS: Stable improvement in vitreous opacities; persistent cystic changes temporal macula - improved, ERM with blunted foveal contour  Clinical management:  See below  Abbreviations: NFP - Normal foveal profile. CME - cystoid macular edema. PED - pigment epithelial detachment. IRF - intraretinal fluid. SRF - subretinal fluid. EZ - ellipsoid zone. ERM - epiretinal membrane. ORA - outer retinal atrophy. ORT - outer retinal tubulation. SRHM - subretinal hyper-reflective material. IRHM - intraretinal hyper-reflective material            ASSESSMENT/PLAN:   ICD-10-CM   1. Proliferative diabetic retinopathy of both eyes with macular edema associated with type 2 diabetes mellitus ([place])  E11.3513 OCT, Retina - OU - Both Eyes    2. Vitreous hemorrhage of left eye (HCC)  H43.12     3. Vitreous hemorrhage, right eye ([place])  H43.11     4. Posterior vitreous detachment of left eye  H43.812     5. Essential hypertension  I10     6. Hypertensive retinopathy of both eyes  H35.033     7. Combined forms of age-related cataract of both eyes  H25.813      1-3. Proliferative diabetic retinopathy w/ recurrent VH OU (OD > OS) - s/p IVA OD #1 (12.08.21), #2 (01.11.22), #3 (02.09.22), #4 (11.04.22), #5 (12.02.22), #6 (12.30.22), #7 (01.27.23) - s/p IVA OS #1 (12.10.21), #2 (01.11.22), #3 (02.09.22), #4 (09.09.22), #5 (10.07.22), #6 (11.04.22), #7 (12.02.22), #8 (12.30.22) - FA (12.08.21)  shows late-leaking MA, vascular nonperfusion, +NV OU (nasal midzone) - s/p PRP OD (01.26.22) - s/p PRP OS (03.09.22) - BCVA 20/25 OD, 20/20 OS - stable OU -  exam shows interval improvement in VH OD; OS with blood stained vit condensations improving from hemorrhagic PVD, and blood clots settled inferiorly  - OCT shows OD: trace vitreous opacities; OS: stable improvement in vitreous opacities; persistent cystic changes temporal macula - improved, ERM with blunted foveal contour  - repeat FA 6.6.22 shows interval improvement in NVE/leakage OU s/p PRP OU-- just minimal leakage remains   - recommend holding heparin at dialysis if able--gave note for patient to take to dialysis on 12/23/2020  - recommend hold injection OU today  - pt in agreement - f/u 6 weeks, DFE, OCT, possible injections  4. Hemorrhagic PVD OS  - Onset mid-August 2022 w/ new onset floaters, no photopsias  - s/p IVA OS #4 (09.09.22), #5 (10.07.22), #6 (11.04.22), #7 (12.02.22), #8 (12.30.22) - today, VA stably improved to 20/20 OS from 20/40 and 20/80 prior - pt states they have been holding heparin at dialysis - exam shows interval improvement in vitreous heme -- clearing and settling inferiorly - pt reports previously receiving heparin during dialysis (T, Th, Sat) -- may have contributed to interval worsening   - Discussed findings and prognosis  - No RT or RD on 360 peripheral exam  - Reviewed s/s of RT/RD  - Strict return precautions for any such RT/RD signs/symptoms  - VH precautions reviewed -- minimize activities, keep head elevated, hold heparin if able, avoid ASA/NSAIDS  5,6. Hypertensive retinopathy OU - discussed importance of tight BP control - monitor   7. Mixed Cataract OU - The symptoms of cataract, surgical options, and treatments and risks were discussed with patient - discussed diagnosis and progression - approaching visual significance - monitor for now  Ophthalmic Meds Ordered this visit:  No  orders of the defined types were placed in this encounter.    Return in about 6 weeks (around 06/29/2021) for f/u NPDR OU, DFE, OCT.  There are no Patient Instructions on file for this visit.  This document serves as a record of services personally performed by Gardiner Sleeper, MD, PhD. It was created on their behalf by Leonie Douglas, an ophthalmic technician. The creation of this record is the provider's dictation and/or activities during the visit.    Electronically signed by: Leonie Douglas COA, 05/21/21  4:22 PM  This document serves as a record of services personally performed by Gardiner Sleeper, MD, PhD. It was created on their behalf by San Jetty. Owens Shark, OA an ophthalmic technician. The creation of this record is the provider's dictation and/or activities during the visit.    Electronically signed by: San Jetty. [place], [place] 02.24.2023 4:22 PM  Gardiner Sleeper, M.D., Ph.D. Diseases & Surgery of the Retina and Vitreous [facility]  I have reviewed the above documentation for accuracy and completeness, and I agree with the above. Gardiner Sleeper, M.D., Ph.D. 05/21/21 4:24 PM  Abbreviations: M myopia (nearsighted); A astigmatism; H hyperopia (farsighted); P presbyopia; Mrx spectacle prescription;  CTL contact lenses; OD right eye; OS left eye; OU both eyes  XT exotropia; ET esotropia; PEK punctate epithelial keratitis; PEE punctate epithelial erosions; DES dry eye syndrome; MGD meibomian gland dysfunction; ATs artificial tears; PFAT's preservative free artificial tears; [place] nuclear sclerotic cataract; PSC posterior subcapsular cataract; ERM epi-retinal membrane; PVD posterior vitreous detachment; RD retinal detachment; DM diabetes mellitus; DR diabetic retinopathy; NPDR non-proliferative diabetic retinopathy; PDR proliferative diabetic retinopathy; CSME clinically significant macular edema; DME diabetic macular edema; dbh dot blot hemorrhages; CWS cotton wool spot; POAG  primary  open angle glaucoma; C/D cup-to-disc ratio; HVF humphrey visual field; GVF goldmann visual field; OCT optical coherence tomography; IOP intraocular pressure; BRVO Branch retinal vein occlusion; CRVO central retinal vein occlusion; CRAO central retinal artery occlusion; BRAO branch retinal artery occlusion; RT retinal tear; SB scleral buckle; PPV pars plana vitrectomy; VH Vitreous hemorrhage; PRP panretinal laser photocoagulation; IVK intravitreal kenalog; VMT vitreomacular traction; MH Macular hole;  NVD neovascularization of the disc; NVE neovascularization elsewhere; AREDS age related eye disease study; ARMD age related macular degeneration; POAG primary open angle glaucoma; EBMD epithelial/anterior basement membrane dystrophy; ACIOL anterior chamber intraocular lens; IOL intraocular lens; PCIOL posterior chamber intraocular lens; Phaco/IOL phacoemulsification with intraocular lens placement; [place] photorefractive keratectomy; LASIK laser assisted in situ keratomileusis; HTN hypertension; DM diabetes mellitus; COPD chronic obstructive pulmonary disease

## 2021-05-12 DIAGNOSIS — N186 End stage renal disease: Secondary | ICD-10-CM | POA: Diagnosis not present

## 2021-05-12 DIAGNOSIS — Z992 Dependence on renal dialysis: Secondary | ICD-10-CM | POA: Diagnosis not present

## 2021-05-12 DIAGNOSIS — L299 Pruritus, unspecified: Secondary | ICD-10-CM | POA: Diagnosis not present

## 2021-05-12 DIAGNOSIS — R52 Pain, unspecified: Secondary | ICD-10-CM | POA: Diagnosis not present

## 2021-05-12 DIAGNOSIS — N2581 Secondary hyperparathyroidism of renal origin: Secondary | ICD-10-CM | POA: Diagnosis not present

## 2021-05-15 DIAGNOSIS — R52 Pain, unspecified: Secondary | ICD-10-CM | POA: Diagnosis not present

## 2021-05-15 DIAGNOSIS — L299 Pruritus, unspecified: Secondary | ICD-10-CM | POA: Diagnosis not present

## 2021-05-15 DIAGNOSIS — Z992 Dependence on renal dialysis: Secondary | ICD-10-CM | POA: Diagnosis not present

## 2021-05-15 DIAGNOSIS — N2581 Secondary hyperparathyroidism of renal origin: Secondary | ICD-10-CM | POA: Diagnosis not present

## 2021-05-15 DIAGNOSIS — N186 End stage renal disease: Secondary | ICD-10-CM | POA: Diagnosis not present

## 2021-05-17 DIAGNOSIS — R52 Pain, unspecified: Secondary | ICD-10-CM | POA: Diagnosis not present

## 2021-05-17 DIAGNOSIS — N2581 Secondary hyperparathyroidism of renal origin: Secondary | ICD-10-CM | POA: Diagnosis not present

## 2021-05-17 DIAGNOSIS — L299 Pruritus, unspecified: Secondary | ICD-10-CM | POA: Diagnosis not present

## 2021-05-17 DIAGNOSIS — Z992 Dependence on renal dialysis: Secondary | ICD-10-CM | POA: Diagnosis not present

## 2021-05-17 DIAGNOSIS — N186 End stage renal disease: Secondary | ICD-10-CM | POA: Diagnosis not present

## 2021-05-18 ENCOUNTER — Ambulatory Visit (INDEPENDENT_AMBULATORY_CARE_PROVIDER_SITE_OTHER): Payer: Medicare Other | Admitting: Ophthalmology

## 2021-05-18 ENCOUNTER — Other Ambulatory Visit: Payer: Self-pay

## 2021-05-18 ENCOUNTER — Encounter (INDEPENDENT_AMBULATORY_CARE_PROVIDER_SITE_OTHER): Payer: Self-pay | Admitting: Ophthalmology

## 2021-05-18 DIAGNOSIS — H25813 Combined forms of age-related cataract, bilateral: Secondary | ICD-10-CM | POA: Diagnosis not present

## 2021-05-18 DIAGNOSIS — H4312 Vitreous hemorrhage, left eye: Secondary | ICD-10-CM

## 2021-05-18 DIAGNOSIS — H35033 Hypertensive retinopathy, bilateral: Secondary | ICD-10-CM | POA: Diagnosis not present

## 2021-05-18 DIAGNOSIS — I10 Essential (primary) hypertension: Secondary | ICD-10-CM

## 2021-05-18 DIAGNOSIS — E113513 Type 2 diabetes mellitus with proliferative diabetic retinopathy with macular edema, bilateral: Secondary | ICD-10-CM | POA: Diagnosis not present

## 2021-05-18 DIAGNOSIS — H4313 Vitreous hemorrhage, bilateral: Secondary | ICD-10-CM | POA: Diagnosis not present

## 2021-05-18 DIAGNOSIS — H43812 Vitreous degeneration, left eye: Secondary | ICD-10-CM | POA: Diagnosis not present

## 2021-05-18 DIAGNOSIS — H4311 Vitreous hemorrhage, right eye: Secondary | ICD-10-CM

## 2021-05-19 DIAGNOSIS — L299 Pruritus, unspecified: Secondary | ICD-10-CM | POA: Diagnosis not present

## 2021-05-19 DIAGNOSIS — Z992 Dependence on renal dialysis: Secondary | ICD-10-CM | POA: Diagnosis not present

## 2021-05-19 DIAGNOSIS — N186 End stage renal disease: Secondary | ICD-10-CM | POA: Diagnosis not present

## 2021-05-19 DIAGNOSIS — R52 Pain, unspecified: Secondary | ICD-10-CM | POA: Diagnosis not present

## 2021-05-19 DIAGNOSIS — N2581 Secondary hyperparathyroidism of renal origin: Secondary | ICD-10-CM | POA: Diagnosis not present

## 2021-05-21 ENCOUNTER — Encounter (INDEPENDENT_AMBULATORY_CARE_PROVIDER_SITE_OTHER): Payer: Self-pay | Admitting: Ophthalmology

## 2021-05-22 DIAGNOSIS — E1122 Type 2 diabetes mellitus with diabetic chronic kidney disease: Secondary | ICD-10-CM | POA: Diagnosis not present

## 2021-05-22 DIAGNOSIS — N2581 Secondary hyperparathyroidism of renal origin: Secondary | ICD-10-CM | POA: Diagnosis not present

## 2021-05-22 DIAGNOSIS — Z992 Dependence on renal dialysis: Secondary | ICD-10-CM | POA: Diagnosis not present

## 2021-05-22 DIAGNOSIS — N186 End stage renal disease: Secondary | ICD-10-CM | POA: Diagnosis not present

## 2021-05-22 DIAGNOSIS — L299 Pruritus, unspecified: Secondary | ICD-10-CM | POA: Diagnosis not present

## 2021-05-22 DIAGNOSIS — R52 Pain, unspecified: Secondary | ICD-10-CM | POA: Diagnosis not present

## 2021-05-24 DIAGNOSIS — N2581 Secondary hyperparathyroidism of renal origin: Secondary | ICD-10-CM | POA: Diagnosis not present

## 2021-05-24 DIAGNOSIS — Z992 Dependence on renal dialysis: Secondary | ICD-10-CM | POA: Diagnosis not present

## 2021-05-24 DIAGNOSIS — N186 End stage renal disease: Secondary | ICD-10-CM | POA: Diagnosis not present

## 2021-05-26 DIAGNOSIS — Z992 Dependence on renal dialysis: Secondary | ICD-10-CM | POA: Diagnosis not present

## 2021-05-26 DIAGNOSIS — N2581 Secondary hyperparathyroidism of renal origin: Secondary | ICD-10-CM | POA: Diagnosis not present

## 2021-05-26 DIAGNOSIS — N186 End stage renal disease: Secondary | ICD-10-CM | POA: Diagnosis not present

## 2021-05-29 DIAGNOSIS — N186 End stage renal disease: Secondary | ICD-10-CM | POA: Diagnosis not present

## 2021-05-29 DIAGNOSIS — N2581 Secondary hyperparathyroidism of renal origin: Secondary | ICD-10-CM | POA: Diagnosis not present

## 2021-05-29 DIAGNOSIS — Z992 Dependence on renal dialysis: Secondary | ICD-10-CM | POA: Diagnosis not present

## 2021-05-31 DIAGNOSIS — N2581 Secondary hyperparathyroidism of renal origin: Secondary | ICD-10-CM | POA: Diagnosis not present

## 2021-05-31 DIAGNOSIS — Z992 Dependence on renal dialysis: Secondary | ICD-10-CM | POA: Diagnosis not present

## 2021-05-31 DIAGNOSIS — N186 End stage renal disease: Secondary | ICD-10-CM | POA: Diagnosis not present

## 2021-06-02 DIAGNOSIS — N2581 Secondary hyperparathyroidism of renal origin: Secondary | ICD-10-CM | POA: Diagnosis not present

## 2021-06-02 DIAGNOSIS — Z992 Dependence on renal dialysis: Secondary | ICD-10-CM | POA: Diagnosis not present

## 2021-06-02 DIAGNOSIS — N186 End stage renal disease: Secondary | ICD-10-CM | POA: Diagnosis not present

## 2021-06-05 DIAGNOSIS — Z992 Dependence on renal dialysis: Secondary | ICD-10-CM | POA: Diagnosis not present

## 2021-06-05 DIAGNOSIS — N186 End stage renal disease: Secondary | ICD-10-CM | POA: Diagnosis not present

## 2021-06-05 DIAGNOSIS — N2581 Secondary hyperparathyroidism of renal origin: Secondary | ICD-10-CM | POA: Diagnosis not present

## 2021-06-07 DIAGNOSIS — N2581 Secondary hyperparathyroidism of renal origin: Secondary | ICD-10-CM | POA: Diagnosis not present

## 2021-06-07 DIAGNOSIS — Z992 Dependence on renal dialysis: Secondary | ICD-10-CM | POA: Diagnosis not present

## 2021-06-07 DIAGNOSIS — N186 End stage renal disease: Secondary | ICD-10-CM | POA: Diagnosis not present

## 2021-06-09 DIAGNOSIS — N2581 Secondary hyperparathyroidism of renal origin: Secondary | ICD-10-CM | POA: Diagnosis not present

## 2021-06-09 DIAGNOSIS — N186 End stage renal disease: Secondary | ICD-10-CM | POA: Diagnosis not present

## 2021-06-09 DIAGNOSIS — Z992 Dependence on renal dialysis: Secondary | ICD-10-CM | POA: Diagnosis not present

## 2021-06-12 DIAGNOSIS — N2581 Secondary hyperparathyroidism of renal origin: Secondary | ICD-10-CM | POA: Diagnosis not present

## 2021-06-12 DIAGNOSIS — N186 End stage renal disease: Secondary | ICD-10-CM | POA: Diagnosis not present

## 2021-06-12 DIAGNOSIS — Z992 Dependence on renal dialysis: Secondary | ICD-10-CM | POA: Diagnosis not present

## 2021-06-15 ENCOUNTER — Emergency Department (HOSPITAL_COMMUNITY): Payer: Medicare Other

## 2021-06-15 ENCOUNTER — Other Ambulatory Visit: Payer: Self-pay

## 2021-06-15 ENCOUNTER — Inpatient Hospital Stay (HOSPITAL_COMMUNITY)
Admission: EM | Admit: 2021-06-15 | Discharge: 2021-06-17 | DRG: 377 | Disposition: A | Discharge status: Home / Self Care | Payer: Medicare Other | Attending: Internal Medicine | Admitting: Internal Medicine

## 2021-06-15 ENCOUNTER — Encounter (HOSPITAL_COMMUNITY): Payer: Self-pay

## 2021-06-15 DIAGNOSIS — N184 Chronic kidney disease, stage 4 (severe): Secondary | ICD-10-CM | POA: Diagnosis not present

## 2021-06-15 DIAGNOSIS — K219 Gastro-esophageal reflux disease without esophagitis: Secondary | ICD-10-CM | POA: Diagnosis present

## 2021-06-15 DIAGNOSIS — K922 Gastrointestinal hemorrhage, unspecified: Secondary | ICD-10-CM | POA: Diagnosis not present

## 2021-06-15 DIAGNOSIS — Z9115 Patient's noncompliance with renal dialysis: Secondary | ICD-10-CM

## 2021-06-15 DIAGNOSIS — I517 Cardiomegaly: Secondary | ICD-10-CM | POA: Diagnosis not present

## 2021-06-15 DIAGNOSIS — E1122 Type 2 diabetes mellitus with diabetic chronic kidney disease: Secondary | ICD-10-CM | POA: Diagnosis present

## 2021-06-15 DIAGNOSIS — Z880 Allergy status to penicillin: Secondary | ICD-10-CM | POA: Diagnosis not present

## 2021-06-15 DIAGNOSIS — K3189 Other diseases of stomach and duodenum: Secondary | ICD-10-CM | POA: Diagnosis not present

## 2021-06-15 DIAGNOSIS — Z992 Dependence on renal dialysis: Secondary | ICD-10-CM | POA: Diagnosis not present

## 2021-06-15 DIAGNOSIS — Z888 Allergy status to other drugs, medicaments and biological substances status: Secondary | ICD-10-CM | POA: Diagnosis not present

## 2021-06-15 DIAGNOSIS — Z841 Family history of disorders of kidney and ureter: Secondary | ICD-10-CM

## 2021-06-15 DIAGNOSIS — R197 Diarrhea, unspecified: Secondary | ICD-10-CM | POA: Diagnosis not present

## 2021-06-15 DIAGNOSIS — D631 Anemia in chronic kidney disease: Secondary | ICD-10-CM | POA: Diagnosis present

## 2021-06-15 DIAGNOSIS — Z20822 Contact with and (suspected) exposure to covid-19: Secondary | ICD-10-CM | POA: Diagnosis not present

## 2021-06-15 DIAGNOSIS — E11319 Type 2 diabetes mellitus with unspecified diabetic retinopathy without macular edema: Secondary | ICD-10-CM | POA: Diagnosis not present

## 2021-06-15 DIAGNOSIS — D62 Acute posthemorrhagic anemia: Secondary | ICD-10-CM | POA: Diagnosis not present

## 2021-06-15 DIAGNOSIS — K920 Hematemesis: Secondary | ICD-10-CM | POA: Diagnosis not present

## 2021-06-15 DIAGNOSIS — R112 Nausea with vomiting, unspecified: Principal | ICD-10-CM

## 2021-06-15 DIAGNOSIS — K2981 Duodenitis with bleeding: Secondary | ICD-10-CM | POA: Diagnosis present

## 2021-06-15 DIAGNOSIS — K2901 Acute gastritis with bleeding: Secondary | ICD-10-CM | POA: Diagnosis not present

## 2021-06-15 DIAGNOSIS — F32A Depression, unspecified: Secondary | ICD-10-CM | POA: Diagnosis not present

## 2021-06-15 DIAGNOSIS — R6889 Other general symptoms and signs: Secondary | ICD-10-CM | POA: Diagnosis not present

## 2021-06-15 DIAGNOSIS — Z833 Family history of diabetes mellitus: Secondary | ICD-10-CM

## 2021-06-15 DIAGNOSIS — N186 End stage renal disease: Secondary | ICD-10-CM | POA: Diagnosis present

## 2021-06-15 DIAGNOSIS — W19XXXA Unspecified fall, initial encounter: Secondary | ICD-10-CM | POA: Diagnosis not present

## 2021-06-15 DIAGNOSIS — E1142 Type 2 diabetes mellitus with diabetic polyneuropathy: Secondary | ICD-10-CM | POA: Diagnosis present

## 2021-06-15 DIAGNOSIS — E119 Type 2 diabetes mellitus without complications: Secondary | ICD-10-CM | POA: Diagnosis not present

## 2021-06-15 DIAGNOSIS — Z743 Need for continuous supervision: Secondary | ICD-10-CM | POA: Diagnosis not present

## 2021-06-15 DIAGNOSIS — N189 Chronic kidney disease, unspecified: Secondary | ICD-10-CM | POA: Diagnosis present

## 2021-06-15 DIAGNOSIS — E875 Hyperkalemia: Secondary | ICD-10-CM | POA: Diagnosis present

## 2021-06-15 DIAGNOSIS — R0602 Shortness of breath: Secondary | ICD-10-CM | POA: Diagnosis not present

## 2021-06-15 DIAGNOSIS — K2970 Gastritis, unspecified, without bleeding: Secondary | ICD-10-CM | POA: Diagnosis not present

## 2021-06-15 DIAGNOSIS — K2971 Gastritis, unspecified, with bleeding: Principal | ICD-10-CM | POA: Diagnosis present

## 2021-06-15 DIAGNOSIS — I120 Hypertensive chronic kidney disease with stage 5 chronic kidney disease or end stage renal disease: Secondary | ICD-10-CM | POA: Diagnosis present

## 2021-06-15 DIAGNOSIS — Z87891 Personal history of nicotine dependence: Secondary | ICD-10-CM

## 2021-06-15 DIAGNOSIS — Z821 Family history of blindness and visual loss: Secondary | ICD-10-CM

## 2021-06-15 DIAGNOSIS — N2581 Secondary hyperparathyroidism of renal origin: Secondary | ICD-10-CM | POA: Diagnosis not present

## 2021-06-15 DIAGNOSIS — R109 Unspecified abdominal pain: Secondary | ICD-10-CM | POA: Diagnosis not present

## 2021-06-15 LAB — I-STAT VENOUS BLOOD GAS, ED
Acid-Base Excess: 2 mmol/L (ref 0.0–2.0)
Bicarbonate: 25.7 mmol/L (ref 20.0–28.0)
Calcium, Ion: 1.01 mmol/L — ABNORMAL LOW (ref 1.15–1.40)
HCT: 33 % — ABNORMAL LOW (ref 39.0–52.0)
Hemoglobin: 11.2 g/dL — ABNORMAL LOW (ref 13.0–17.0)
O2 Saturation: 98 %
Potassium: 6.4 mmol/L (ref 3.5–5.1)
Sodium: 134 mmol/L — ABNORMAL LOW (ref 135–145)
TCO2: 27 mmol/L (ref 22–32)
pCO2, Ven: 36.5 mmHg — ABNORMAL LOW (ref 44–60)
pH, Ven: 7.455 — ABNORMAL HIGH (ref 7.25–7.43)
pO2, Ven: 100 mmHg — ABNORMAL HIGH (ref 32–45)

## 2021-06-15 LAB — COMPREHENSIVE METABOLIC PANEL
ALT: 14 U/L (ref 0–44)
AST: 15 U/L (ref 15–41)
Albumin: 4.1 g/dL (ref 3.5–5.0)
Alkaline Phosphatase: 94 U/L (ref 38–126)
Anion gap: 19 — ABNORMAL HIGH (ref 5–15)
BUN: 74 mg/dL — ABNORMAL HIGH (ref 8–23)
CO2: 24 mmol/L (ref 22–32)
Calcium: 9.5 mg/dL (ref 8.9–10.3)
Chloride: 94 mmol/L — ABNORMAL LOW (ref 98–111)
Creatinine, Ser: 15.38 mg/dL — ABNORMAL HIGH (ref 0.61–1.24)
GFR, Estimated: 3 mL/min — ABNORMAL LOW (ref >60)
Glucose, Bld: 125 mg/dL — ABNORMAL HIGH (ref 70–99)
Potassium: 5.9 mmol/L — ABNORMAL HIGH (ref 3.5–5.1)
Sodium: 137 mmol/L (ref 135–145)
Total Bilirubin: 1.5 mg/dL — ABNORMAL HIGH (ref 0.3–1.2)
Total Protein: 8.2 g/dL — ABNORMAL HIGH (ref 6.5–8.1)

## 2021-06-15 LAB — CBC WITH DIFFERENTIAL/PLATELET
Abs Immature Granulocytes: 0.02 10*3/uL (ref 0.00–0.07)
Basophils Absolute: 0 10*3/uL (ref 0.0–0.1)
Basophils Relative: 1 %
Eosinophils Absolute: 0.2 10*3/uL (ref 0.0–0.5)
Eosinophils Relative: 3 %
HCT: 32.2 % — ABNORMAL LOW (ref 39.0–52.0)
Hemoglobin: 10.3 g/dL — ABNORMAL LOW (ref 13.0–17.0)
Immature Granulocytes: 0 %
Lymphocytes Relative: 13 %
Lymphs Abs: 0.8 10*3/uL (ref 0.7–4.0)
MCH: 33 pg (ref 26.0–34.0)
MCHC: 32 g/dL (ref 30.0–36.0)
MCV: 103.2 fL — ABNORMAL HIGH (ref 80.0–100.0)
Monocytes Absolute: 0.4 10*3/uL (ref 0.1–1.0)
Monocytes Relative: 7 %
Neutro Abs: 4.5 10*3/uL (ref 1.7–7.7)
Neutrophils Relative %: 76 %
Platelets: 147 10*3/uL — ABNORMAL LOW (ref 150–400)
RBC: 3.12 MIL/uL — ABNORMAL LOW (ref 4.22–5.81)
RDW: 14.6 % (ref 11.5–15.5)
WBC: 5.9 10*3/uL (ref 4.0–10.5)
nRBC: 0 % (ref 0.0–0.2)

## 2021-06-15 LAB — BRAIN NATRIURETIC PEPTIDE: B Natriuretic Peptide: >4500 pg/mL — ABNORMAL HIGH (ref 0.0–100.0)

## 2021-06-15 LAB — CBG MONITORING, ED: Glucose-Capillary: 157 mg/dL — ABNORMAL HIGH (ref 70–99)

## 2021-06-15 LAB — TROPONIN I (HIGH SENSITIVITY)
Troponin I (High Sensitivity): 18 ng/L — ABNORMAL HIGH (ref <18)
Troponin I (High Sensitivity): 17 ng/L (ref <18)

## 2021-06-15 LAB — PROTIME-INR
INR: 1.3 — ABNORMAL HIGH (ref 0.8–1.2)
Prothrombin Time: 16.1 seconds — ABNORMAL HIGH (ref 11.4–15.2)

## 2021-06-15 LAB — RESP PANEL BY RT-PCR (FLU A&B, COVID) ARPGX2
Influenza A by PCR: NEGATIVE
Influenza B by PCR: NEGATIVE
SARS Coronavirus 2 by RT PCR: NEGATIVE

## 2021-06-15 LAB — HEMOGLOBIN AND HEMATOCRIT, BLOOD
HCT: 31.6 % — ABNORMAL LOW (ref 39.0–52.0)
Hemoglobin: 10.5 g/dL — ABNORMAL LOW (ref 13.0–17.0)

## 2021-06-15 LAB — LIPASE, BLOOD: Lipase: 93 U/L — ABNORMAL HIGH (ref 11–51)

## 2021-06-15 MED ORDER — LIDOCAINE HCL (PF) 1 % IJ SOLN: 5.0000 mL | INTRAMUSCULAR | Status: DC | PRN

## 2021-06-15 MED ORDER — ACETAMINOPHEN 325 MG PO TABS: 325.0000 mg | ORAL_TABLET | Freq: Four times a day (QID) | ORAL | Status: DC | PRN

## 2021-06-15 MED ORDER — PANTOPRAZOLE SODIUM 40 MG IV SOLR: 40.0000 mg | Freq: Two times a day (BID) | INTRAVENOUS | Status: DC

## 2021-06-15 MED ORDER — DEXTROSE 50 % IV SOLN
1.0000 | Freq: Once | INTRAVENOUS | Status: AC
  Administered 2021-06-15: 50 mL via INTRAVENOUS
  Filled 2021-06-15: qty 50

## 2021-06-15 MED ORDER — GABAPENTIN 300 MG PO CAPS
300.0000 mg | ORAL_CAPSULE | Freq: Every day | ORAL | Status: DC
  Administered 2021-06-15 – 2021-06-16 (×2): 300 mg via ORAL
  Filled 2021-06-15 (×2): qty 1

## 2021-06-15 MED ORDER — SUCROFERRIC OXYHYDROXIDE 500 MG PO CHEW
1500.0000 mg | CHEWABLE_TABLET | ORAL | Status: DC
  Filled 2021-06-15: qty 3

## 2021-06-15 MED ORDER — CHLORHEXIDINE GLUCONATE CLOTH 2 % EX PADS
6.0000 | MEDICATED_PAD | Freq: Every day | CUTANEOUS | Status: DC
  Administered 2021-06-16 – 2021-06-17 (×2): 6 via TOPICAL

## 2021-06-15 MED ORDER — HEPARIN SODIUM (PORCINE) 5000 UNIT/ML IJ SOLN: 5000.0000 [IU] | Freq: Three times a day (TID) | INTRAMUSCULAR | Status: DC

## 2021-06-15 MED ORDER — SERTRALINE HCL 50 MG PO TABS
25.0000 mg | ORAL_TABLET | Freq: Every day | ORAL | Status: DC | PRN
  Filled 2021-06-15: qty 1

## 2021-06-15 MED ORDER — METOCLOPRAMIDE HCL 5 MG/ML IJ SOLN
10.0000 mg | Freq: Once | INTRAMUSCULAR | Status: AC
  Administered 2021-06-15: 10 mg via INTRAVENOUS
  Filled 2021-06-15: qty 2

## 2021-06-15 MED ORDER — MORPHINE SULFATE (PF) 4 MG/ML IV SOLN
4.0000 mg | Freq: Once | INTRAVENOUS | Status: AC
  Administered 2021-06-15: 4 mg via INTRAVENOUS
  Filled 2021-06-15: qty 1

## 2021-06-15 MED ORDER — PANTOPRAZOLE SODIUM 40 MG IV SOLR
40.0000 mg | Freq: Two times a day (BID) | INTRAVENOUS | Status: DC
  Administered 2021-06-15 – 2021-06-17 (×3): 40 mg via INTRAVENOUS
  Filled 2021-06-15 (×3): qty 10

## 2021-06-15 MED ORDER — ONDANSETRON HCL 4 MG/2ML IJ SOLN
4.0000 mg | Freq: Four times a day (QID) | INTRAMUSCULAR | Status: DC | PRN
Start: 2021-06-15 — End: 2021-06-17

## 2021-06-15 MED ORDER — ONDANSETRON HCL 4 MG PO TABS: 4.0000 mg | ORAL_TABLET | Freq: Four times a day (QID) | ORAL | Status: DC | PRN

## 2021-06-15 MED ORDER — SODIUM CHLORIDE 0.9 % IV SOLN: 100.0000 mL | INTRAVENOUS | Status: DC | PRN

## 2021-06-15 MED ORDER — LIDOCAINE-PRILOCAINE 2.5-2.5 % EX CREA: 1.0000 "application " | TOPICAL_CREAM | CUTANEOUS | Status: DC | PRN

## 2021-06-15 MED ORDER — HEPARIN SODIUM (PORCINE) 1000 UNIT/ML DIALYSIS: 1000.0000 [IU] | INTRAMUSCULAR | Status: DC | PRN

## 2021-06-15 MED ORDER — PANTOPRAZOLE SODIUM 40 MG IV SOLR
40.0000 mg | Freq: Once | INTRAVENOUS | Status: AC
  Administered 2021-06-15: 40 mg via INTRAVENOUS
  Filled 2021-06-15: qty 10

## 2021-06-15 MED ORDER — ALTEPLASE 2 MG IJ SOLR: 2.0000 mg | Freq: Once | INTRAMUSCULAR | Status: DC | PRN

## 2021-06-15 MED ORDER — PENTAFLUOROPROP-TETRAFLUOROETH EX AERO: 1.0000 "application " | INHALATION_SPRAY | CUTANEOUS | Status: DC | PRN

## 2021-06-15 MED ORDER — INSULIN ASPART 100 UNIT/ML IV SOLN
5.0000 [IU] | Freq: Once | INTRAVENOUS | Status: AC
  Administered 2021-06-15: 5 [IU] via INTRAVENOUS

## 2021-06-15 MED ORDER — DIPHENHYDRAMINE HCL 50 MG/ML IJ SOLN
12.5000 mg | Freq: Once | INTRAMUSCULAR | Status: AC
  Administered 2021-06-15: 12.5 mg via INTRAVENOUS
  Filled 2021-06-15: qty 1

## 2021-06-15 MED ORDER — CALCIUM GLUCONATE-NACL 1-0.675 GM/50ML-% IV SOLN
1.0000 g | Freq: Once | INTRAVENOUS | Status: AC
  Administered 2021-06-15: 1000 mg via INTRAVENOUS
  Filled 2021-06-15: qty 50

## 2021-06-15 MED ORDER — ONDANSETRON HCL 4 MG/2ML IJ SOLN
4.0000 mg | Freq: Once | INTRAMUSCULAR | Status: AC
  Administered 2021-06-15: 4 mg via INTRAVENOUS
  Filled 2021-06-15: qty 2

## 2021-06-15 NOTE — ED Notes (Signed)
Pt tx to dialysis ?

## 2021-06-15 NOTE — H&P (Signed)
?History and Physical  ? ? ?Jonathan Mcneil HKV:[id number] DOB: 03-Feb-1956 DOA: 06/15/2021 ? ?PCP: Merrilee Seashore, MD (Confirm with patient/family/NH records and if not entered, this has to be entered at [facility] point of entry) ?Patient coming from: Home ? ?I have personally briefly reviewed patient's old medical records in [place] ? ?Chief Complaint: Vomiting blood ? ?HPI: Jonathan Mcneil is a 66 y.o. male with medical history significant of ESRD on HD TTS, HTN, chronic neuropathy, GERD, presented with new onset of hematemesis. ? ?Patient also has a history of repeated hospitalizations for recurrent feeling nauseous vomiting for the last 2 years in the past work-up etiology either marijuana versus viral gastroenteritis.  He had a EGD once in 2021 which showed 2 polyps in the duodenum.  He has GERD on PPI has been compliant.  Still, chronically, he has frequent feeling of nauseous, which he repeated to gabapentin, he used to take 300 mg in the evenings, which reduced to 100 mg daily since about 2 months ago.  Despite, continue to experience frequent episodes of nauseous and occasional vomiting of stomach content.  This morning, however patient started to have frequent vomiting, then the vomitus became bloody, fresh red blood, contain clots, amount varies from teaspoons to half cup, and any chest pain, no lightheadedness or shortness of breath.  He has some weak epigastric discomfort but no specific pains, no diarrhea.  He missed yesterday's dialysis due to feeling nauseous. ? ?ED Course: No hypotension no tachycardia. ? ?CT abdomen pelvis negative for acute findings. ? ?Blood work K6.4, creatinine 15, BUN 74.  Nephrology consulted and patient underwent emergency dialysis. ? ?Review of Systems: As per HPI otherwise 14 point review of systems negative.  ? ? ?Past Medical History:  ?Diagnosis Date  ? Cataract   ? Diabetes mellitus without complication ([place])   ? Diabetic retinopathy ([place])   ? ESRD (end  stage renal disease) ([place])   ? GERD (gastroesophageal reflux disease)   ? PMH  ? Hypertension   ? Hypertensive retinopathy   ? Low back pain   ? Metabolic acidosis   ? Neuromuscular disorder ([place])   ? peripheral neuropathy  ? Pancreatitis   ? Schizophrenia ([place])   ? does not take medications  ? ? ?Past Surgical History:  ?Procedure Laterality Date  ? A/V FISTULAGRAM Left 06/03/2018  ? Procedure: A/V FISTULAGRAM;  Surgeon: Marty Heck, MD;  Location: [place] CV LAB;  Service: Cardiovascular;  Laterality: Left;  ? AV FISTULA PLACEMENT Left 02/11/2018  ? Procedure: INSERTION OF ARTERIOVENOUS (AV) FISTULA LEFT  ARM;  Surgeon: [name], MD;  Location: [place];  Service: Vascular;  Laterality: Left;  ? BASCILIC VEIN TRANSPOSITION Left 04/17/2018  ? Procedure: BASILIC VEIN TRANSPOSITION SECOND STAGE;  Surgeon: [name], MD;  Location: [place];  Service: Vascular;  Laterality: Left;  ? BIOPSY  10/20/2019  ? Procedure: BIOPSY;  Surgeon: Clarene Essex, MD;  Location: [place];  Service: Endoscopy;;  ? COLONOSCOPY    ? DENTAL SURGERY    ? ESOPHAGOGASTRODUODENOSCOPY (EGD) WITH PROPOFOL N/A 10/20/2019  ? Procedure: ESOPHAGOGASTRODUODENOSCOPY (EGD) WITH PROPOFOL;  Surgeon: Clarene Essex, MD;  Location: [place];  Service: Endoscopy;  Laterality: N/A;  ? INSERTION OF DIALYSIS CATHETER Right 02/25/2018  ? Procedure: INSERTION OF DIALYSIS CATHETER;  Surgeon: [name], MD;  Location: [place];  Service: Vascular;  Laterality: Right;  ? PERIPHERAL VASCULAR BALLOON ANGIOPLASTY  06/03/2018  ? Procedure: PERIPHERAL VASCULAR BALLOON ANGIOPLASTY;  Surgeon: Marty Heck, MD;  Location: [place] CV LAB;  Service: Cardiovascular;;  left a/v fistula  ? ? ? reports that he has quit smoking. He has never used smokeless tobacco. He reports that he does not currently use drugs after having used the following drugs: Cocaine and Marijuana. He reports that he does not drink  alcohol. ? ?Allergies  ?Allergen Reactions  ? Gabapentin Nausea Only  ? Ibuprofen Hives  ? Penicillins Itching and Other (See Comments)  ?  Has patient had a PCN reaction causing immediate rash, facial/tongue/throat swelling, SOB or lightheadedness with hypotension: No ?Has patient had a PCN reaction causing severe rash involving mucus membranes or skin necrosis: No ?Has patient had a PCN reaction that required hospitalization No ?Has patient had a PCN reaction occurring within the last 10 years: Unknown ?If all of the above answers are "NO", then may proceed with Cephalosporin use.  ? Venlafaxine Other (See Comments)  ?  insomnia  ? ? ?Family History  ?Problem Relation Age of Onset  ? Kidney failure Mother   ? Diabetes Father   ? Blindness Brother   ? ? ? ?Prior to Admission medications   ?Medication Sig Start Date End Date Taking? Authorizing Provider  ?acetaminophen (TYLENOL) 650 MG CR tablet Take 1,300 mg by mouth daily as needed for pain.   Yes [provider]  ?gabapentin (NEURONTIN) 300 MG capsule Take 300 mg by mouth at bedtime. 09/20/20  Yes [provider]  ?sertraline (ZOLOFT) 25 MG tablet Take 25 mg by mouth daily as needed (depression). 11/14/20  Yes [provider]  ?sucroferric oxyhydroxide (VELPHORO) 500 MG chewable tablet Chew 1,500 mg by mouth See admin instructions. 1500mg  before dialysis Tuesday, Thursday, Saturday 10/12/19  Yes [provider]  ?loperamide (IMODIUM A-D) 2 MG tablet Take 1 tablet (2 mg total) by mouth 4 (four) times daily as needed for diarrhea or loose stools. ?Patient not taking: Reported on 06/15/2021 03/20/21   Sharen Hones, MD  ? ? ?Physical Exam: ?Vitals:  ? 06/15/21 1200 06/15/21 1256 06/15/21 1330 06/15/21 1400  ?BP: (!) 140/95 136/89 136/87 140/90  ?Pulse: 80 78 76 75  ?Resp: 20 16 17 16   ?Temp:  98.1 ?F (36.7 ?C)    ?TempSrc:  Oral    ?SpO2: 94%     ?Weight:      ?Height:      ? ? ?Constitutional: NAD, calm, comfortable ?Vitals:  ?  06/15/21 1200 06/15/21 1256 06/15/21 1330 06/15/21 1400  ?BP: (!) 140/95 136/89 136/87 140/90  ?Pulse: 80 78 76 75  ?Resp: 20 16 17 16   ?Temp:  98.1 ?F (36.7 ?C)    ?TempSrc:  Oral    ?SpO2: 94%     ?Weight:      ?Height:      ? ?Eyes: PERRL, lids and conjunctivae normal ?ENMT: Mucous membranes are moist. Posterior pharynx clear of any exudate or lesions.Normal dentition.  ?Neck: normal, supple, no masses, no thyromegaly ?Respiratory: clear to auscultation bilaterally, no wheezing, no crackles. Normal respiratory effort. No accessory muscle use.  ?Cardiovascular: Regular rate and rhythm, no murmurs / rubs / gallops. No extremity edema. 2+ pedal pulses. No carotid bruits.  ?Abdomen: no tenderness, no masses palpated. No hepatosplenomegaly. Bowel sounds positive.  ?Musculoskeletal: no clubbing / cyanosis. No joint deformity upper and lower extremities. Good ROM, no contractures. Normal muscle tone.  ?Skin: no rashes, lesions, ulcers. No induration ?Neurologic: CN 2-12 grossly intact. Sensation intact, DTR normal. Strength 5/5 in all  4.  ?Psychiatric: Normal judgment and insight. Alert and oriented x 3. Normal mood.  ? ? ? ?Labs on Admission: I have personally reviewed following labs and imaging studies ? ?CBC: ?Recent Labs  ?Lab 06/15/21 ?8138 06/15/21 ?1020  ?WBC 5.9  --   ?NEUTROABS 4.5  --   ?HGB 10.3* 11.2*  ?HCT 32.2* 33.0*  ?MCV 103.2*  --   ?PLT 147*  --   ? ?Basic Metabolic Panel: ?Recent Labs  ?Lab 06/15/21 ?8719 06/15/21 ?1020  ?NA 137 134*  ?K 5.9* 6.4*  ?CL 94*  --   ?CO2 24  --   ?GLUCOSE 125*  --   ?BUN 74*  --   ?CREATININE 15.38*  --   ?CALCIUM 9.5  --   ? ?GFR: ?Estimated Creatinine Clearance: 5.5 mL/min (A) (by C-G formula based on SCr of 15.38 mg/dL (H)). ?Liver Function Tests: ?Recent Labs  ?Lab 06/15/21 ?0844  ?AST 15  ?ALT 14  ?ALKPHOS 94  ?BILITOT 1.5*  ?PROT 8.2*  ?ALBUMIN 4.1  ? ?Recent Labs  ?Lab 06/15/21 ?0844  ?LIPASE 93*  ? ?No results for input(s): AMMONIA in the last 168  hours. ?Coagulation Profile: ?Recent Labs  ?Lab 06/15/21 ?1007  ?INR 1.3*  ? ?Cardiac Enzymes: ?No results for input(s): CKTOTAL, CKMB, CKMBINDEX, TROPONINI in the last 168 hours. ?BNP (last 3 results) ?No results for input(s): PROBNP

## 2021-06-15 NOTE — ED Notes (Signed)
Hemodialysis consent form signed and witnessed. ?

## 2021-06-15 NOTE — ED Provider Notes (Signed)
?[facility] ?Provider Note ? ? ?CSN: [id number] ?Arrival date & time: 06/15/21  0808 ? ?  ? ?History ? ?Chief Complaint  ?Patient presents with  ? Emesis  ? ? ?Jonathan Mcneil is a 66 y.o. male. ? ?HPI ? ?66 year old male with past medical history of ESRD on HD every T/R/S, HTN, HLD, DM presents emergency department with nausea/vomiting/diarrhea.  Patient's been noncompliant with dialysis, admits to missing this past Thursday session for unknown reasons.  Coming in today with nausea/vomiting/diarrhea for over the past day.  He also states that he has been having an intermittently productive cough with an episode of hemoptysis.  Denies any acute chest pain.  States that his abdomen is diffusely tender.  Denies any blood in the emesis or diarrhea.  Denies any fever at home. ? ?Home Medications ?Prior to Admission medications   ?Medication Sig Start Date End Date Taking? Authorizing Provider  ?acetaminophen (TYLENOL) 650 MG CR tablet Take 1,300 mg by mouth daily as needed for pain.   Yes [provider]  ?gabapentin (NEURONTIN) 300 MG capsule Take 300 mg by mouth at bedtime. 09/20/20  Yes [provider]  ?sertraline (ZOLOFT) 25 MG tablet Take 25 mg by mouth daily as needed (depression). 11/14/20  Yes [provider]  ?sucroferric oxyhydroxide (VELPHORO) 500 MG chewable tablet Chew 1,500 mg by mouth See admin instructions. 1500mg  before dialysis Tuesday, Thursday, Saturday 10/12/19  Yes [provider]  ?loperamide (IMODIUM A-D) 2 MG tablet Take 1 tablet (2 mg total) by mouth 4 (four) times daily as needed for diarrhea or loose stools. ?Patient not taking: Reported on 06/15/2021 03/20/21   Sharen Hones, MD  ?   ? ?Allergies    ?Gabapentin, Ibuprofen, Penicillins, and Venlafaxine   ? ?Review of Systems   ?Review of Systems  ?Constitutional:  Positive for appetite change and fatigue. Negative for fever.  ?Respiratory:  Positive for cough and  shortness of breath. Negative for wheezing.   ?Cardiovascular:  Negative for chest pain, palpitations and leg swelling.  ?Gastrointestinal:  Positive for abdominal pain, diarrhea, nausea and vomiting.  ?Skin:  Negative for rash.  ?Neurological:  Negative for headaches.  ? ?Physical Exam ?Updated Vital Signs ?BP (!) 168/103   Pulse 82   Temp (!) 97.3 ?F (36.3 ?C) (Oral)   Resp 16   Ht 5\' 9"  (1.753 m)   Wt 95.3 kg   SpO2 95%   BMI 31.01 kg/m?  ?Physical Exam ?Vitals and nursing note reviewed.  ?Constitutional:   ?   General: He is not in acute distress. ?   Appearance: Normal appearance. He is not diaphoretic.  ?HENT:  ?   Head: Normocephalic.  ?   Mouth/Throat:  ?   Mouth: Mucous membranes are moist.  ?Cardiovascular:  ?   Rate and Rhythm: Normal rate.  ?Pulmonary:  ?   Effort: Pulmonary effort is normal. No respiratory distress.  ?   Breath sounds: Rales present.  ?Abdominal:  ?   General: There is no distension.  ?   Palpations: Abdomen is soft.  ?   Tenderness: There is abdominal tenderness. There is no guarding or rebound.  ?Musculoskeletal:     ?   General: Swelling present.  ?Skin: ?   General: Skin is warm.  ?Neurological:  ?   Mental Status: He is alert and oriented to person, place, and time. Mental status is at baseline.  ?Psychiatric:     ?   Mood and Affect:  Mood normal.  ? ? ?ED Results / Procedures / Treatments   ?Labs ?(all labs ordered are listed, but only abnormal results are displayed) ?Labs Reviewed  ?CBC WITH DIFFERENTIAL/PLATELET - Abnormal; Notable for the following components:  ?    Result Value  ? RBC 3.12 (*)   ? Hemoglobin 10.3 (*)   ? HCT 32.2 (*)   ? MCV 103.2 (*)   ? Platelets 147 (*)   ? All other components within normal limits  ?COMPREHENSIVE METABOLIC PANEL - Abnormal; Notable for the following components:  ? Potassium 5.9 (*)   ? Chloride 94 (*)   ? Glucose, Bld 125 (*)   ? BUN 74 (*)   ? Creatinine, Ser 15.38 (*)   ? Total Protein 8.2 (*)   ? Total Bilirubin 1.5 (*)   ? GFR,  Estimated 3 (*)   ? Anion gap 19 (*)   ? All other components within normal limits  ?LIPASE, BLOOD - Abnormal; Notable for the following components:  ? Lipase 93 (*)   ? All other components within normal limits  ?I-STAT VENOUS BLOOD GAS, ED - Abnormal; Notable for the following components:  ? pH, Ven 7.455 (*)   ? pCO2, Ven 36.5 (*)   ? pO2, Ven 100 (*)   ? Sodium 134 (*)   ? Potassium 6.4 (*)   ? Calcium, Ion 1.01 (*)   ? HCT 33.0 (*)   ? Hemoglobin 11.2 (*)   ? All other components within normal limits  ?RESP PANEL BY RT-PCR (FLU A&B, COVID) [id number]  ?BRAIN NATRIURETIC PEPTIDE  ?PROTIME-INR  ?TROPONIN I (HIGH SENSITIVITY)  ? ? ?EKG ?EKG Interpretation ? ?Date/Time:  Friday June 15 2021 08:30:37 EDT ?Ventricular Rate:  80 ?PR Interval:  170 ?QRS Duration: 102 ?QT Interval:  441 ?QTC Calculation: 509 ?R Axis:   27 ?Text Interpretation: Sinus rhythm Probable left atrial enlargement Probable anteroseptal infarct, old Prolonged QT interval Confirmed by Lavenia Atlas [id number]) on 06/15/2021 8:31:52 AM ? ?Radiology ?DG Chest Port 1 View ? ?Result Date: 06/15/2021 ?CLINICAL DATA:  66 year old male with shortness of breath. EXAM: PORTABLE CHEST - 1 VIEW COMPARISON:  04/10/2021 FINDINGS: Unchanged cardiomegaly. Mild cephalization of pulmonary vasculature. Streaky bibasilar pulmonary opacities. No evidence of significant pleural effusion or pneumothorax. No acute osseous abnormality. IMPRESSION: Mild pulmonary edema, likely cardiogenic given cardiomegaly. Electronically Signed   By: Ruthann Cancer M.D.   On: 06/15/2021 10:10   ? ?Procedures ?Marland KitchenCritical Care ?Performed by: Lorelle Gibbs, DO ?Authorized by: Lorelle Gibbs, DO  ? ?Critical care provider statement:  ?  Critical care time (minutes):  45 ?  Critical care was necessary to treat or prevent imminent or life-threatening deterioration of the following conditions:  Metabolic crisis and renal failure ?  Critical care was time spent personally by me on the following  activities:  Development of treatment plan with patient or surrogate, discussions with consultants, evaluation of patient's response to treatment, examination of patient, ordering and review of laboratory studies, ordering and review of radiographic studies, ordering and performing treatments and interventions, pulse oximetry, re-evaluation of patient's condition and review of old charts ?  I assumed direction of critical care for this patient from another provider in my specialty: no   ?  Care discussed with: admitting provider    ? ? ?Medications Ordered in ED ?Medications  ?calcium gluconate 1 g/ 50 mL sodium chloride IVPB (1,000 mg Intravenous New Bag/Given 06/15/21 1015)  ?Chlorhexidine Gluconate Cloth 2 % PADS  6 each (has no administration in time range)  ?ondansetron [facility]) injection 4 mg (4 mg Intravenous Given 06/15/21 0949)  ?morphine (PF) 4 MG/ML injection 4 mg (4 mg Intravenous Given 06/15/21 0949)  ?diphenhydrAMINE (BENADRYL) injection 12.5 mg (12.5 mg Intravenous Given 06/15/21 1029)  ?insulin aspart (novoLOG) injection 5 Units (5 Units Intravenous Given 06/15/21 1028)  ?  And  ?dextrose 50 % solution 50 mL (50 mLs Intravenous Given 06/15/21 1028)  ?metoCLOPramide (REGLAN) injection 10 mg (10 mg Intravenous Given 06/15/21 1029)  ?pantoprazole (PROTONIX) injection 40 mg (40 mg Intravenous Given 06/15/21 1029)  ? ? ?ED Course/ Medical Decision Making/ A&P ?  ?                        ?Medical Decision Making ?Amount and/or Complexity of Data Reviewed ?Labs: ordered. ?Radiology: ordered. ? ?Risk ?OTC drugs. ?Prescription drug management. ?Decision regarding hospitalization. ? ? ?66 year old male presents emergency department nausea/vomiting/diarrhea, missed dialysis.  Hypertensive on arrival, active vomiting on my exam, tender but soft abdomen. ? ?Blood work is concerning for worsening kidney dysfunction, hyperkalemia without hemolysis.  Currently symptoms are controlled with IV medications.  CT of the abdomen  pelvis done without IV contrast due to renal dysfunction shows no acute/surgical problem.  On-call nephrology has been contacted, plan for dialysis for this patient. Patient has secondary cardiac demand/failure fro

## 2021-06-15 NOTE — Consult Note (Addendum)
ESRD Consult Note ? ?Assessment/Recommendations:  ? ?# ESRD:  ?-outpatient HD orders: [facility] TTS. 4.5hrs, f180, BFR 550/autoflow 1.5, edw 94.5kg, 2k, 2.5ca, UF profile 4. Meds: calcitriol 77mcg qtreatment ?-missed HD yesterday, will plan for HD today and back on TTS schedule tomorrow ? ?# N/V/D ?-etiology unclear, was recently admitted for this with the diagnosis of acute gastroenteritis. W/u per primary service ? ?# Hyperkalemia ?-would recommend a dose of Lokelma, planning on HD today and tomorrow as above ? ?# Volume/ hypertension: EDW 94.5kg. UF as tolerated ? ?# Anemia of Chronic Kidney Disease: Hemoglobin 11.2. Not on ESAs as an outpatient, will monitor in the interim. ? ?# Secondary Hyperparathyroidism/Hyperphosphatemia: resume home binders, will resume calcitriol  ? ?# Vascular access: LUE AVF +b/t ? ?# Additional recommendations: ?- Dose all meds for creatinine clearance < 10 ml/min  ?- Unless absolutely necessary, no MRIs with gadolinium.  ?- Implement save arm precautions.  Prefer needle sticks in the dorsum of the hands or wrists.  No blood pressure measurements in arm. ?- If blood transfusion is requested during hemodialysis sessions, please alert [place] prior to the session.  ?- If a hemodialysis catheter line culture is requested, please alert [place] as only hemodialysis nurses are able to collect those specimens.  ? ?Recommendations were discussed with the primary team. ? ?Gean Quint, MD ?[facility] ? ?History of Present Illness: Jonathan Mcneil is a/an 66 y.o. male with a past medical history of ESRD who presents with n/v/d, missed HD yesterday. Also with SOB, CXR w/ mild pulmonary edema. K was also 5.9 on presentation to the ER. Patient reports that he has been having ongoing abdominal issues and back issues. Patient seen and examined in HD. He reports that his breathing is better now, laying flat. UFG 3.5L and tolerating treatment. ? ? ?Medications:  ?Current Facility-Administered  Medications  ?Medication Dose Route Frequency Provider Last Rate Last Admin  ? calcium gluconate 1 g/ 50 mL sodium chloride IVPB  1 g Intravenous Once Lorelle Gibbs, DO 50 mL/hr at 06/15/21 1015 1,000 mg at 06/15/21 1015  ? ?Current Outpatient Medications  ?Medication Sig Dispense Refill  ? acetaminophen (TYLENOL) 650 MG CR tablet Take 1,300 mg by mouth daily as needed for pain.    ? gabapentin (NEURONTIN) 300 MG capsule Take 300 mg by mouth at bedtime.    ? sertraline (ZOLOFT) 25 MG tablet Take 25 mg by mouth daily as needed (depression).    ? sucroferric oxyhydroxide (VELPHORO) 500 MG chewable tablet Chew 1,500 mg by mouth See admin instructions. 1500mg  before dialysis Tuesday, Thursday, Saturday    ? loperamide (IMODIUM A-D) 2 MG tablet Take 1 tablet (2 mg total) by mouth 4 (four) times daily as needed for diarrhea or loose stools. (Patient not taking: Reported on 06/15/2021) 20 tablet 0  ?  ? ?ALLERGIES ?Gabapentin, Ibuprofen, Penicillins, and Venlafaxine ? ?MEDICAL HISTORY ?Past Medical History:  ?Diagnosis Date  ? Cataract   ? Diabetes mellitus without complication ([place])   ? Diabetic retinopathy ([place])   ? ESRD (end stage renal disease) ([place])   ? GERD (gastroesophageal reflux disease)   ? PMH  ? Hypertension   ? Hypertensive retinopathy   ? Low back pain   ? Metabolic acidosis   ? Neuromuscular disorder ([place])   ? peripheral neuropathy  ? Pancreatitis   ? Schizophrenia ([place])   ? does not take medications  ?  ? ?SOCIAL HISTORY ?Social History  ? ?Socioeconomic History  ? Marital  status: Married  ?  Spouse name: Not on file  ? Number of children: Not on file  ? Years of education: Not on file  ? Highest education level: Not on file  ?Occupational History  ? Occupation: Mudlogger  ?Tobacco Use  ? Smoking status: Former  ? Smokeless tobacco: Never  ?Vaping Use  ? Vaping Use: Never used  ?Substance and Sexual Activity  ? Alcohol use: No  ? Drug use: Not Currently  ?  Types: Cocaine, Marijuana  ?   Comment: smoked marijuana a few days ago; he denies using cocaine  ? Sexual activity: Not Currently  ?Other Topics Concern  ? Not on file  ?Social History Narrative  ? Not on file  ? ?Social Determinants of Health  ? ?Financial Resource Strain: Not on file  ?Food Insecurity: Not on file  ?Transportation Needs: Not on file  ?Physical Activity: Not on file  ?Stress: Not on file  ?Social Connections: Not on file  ?Intimate Partner Violence: Not on file  ?  ? ?FAMILY HISTORY ?Family History  ?Problem Relation Age of Onset  ? Kidney failure Mother   ? Diabetes Father   ? Blindness Brother   ?  ? ?Review of Systems: ?12 systems were reviewed and negative except per HPI ? ?Physical Exam: ?Vitals:  ? 06/15/21 0900 06/15/21 0930  ?BP: (!) 159/99 (!) 168/103  ?Pulse: 82   ?Resp: 20 16  ?Temp:    ?SpO2: 95%   ? ?No intake/output data recorded. ?No intake or output data in the 24 hours ending 06/15/21 1051 ?General: well-appearing, no acute distress ?HEENT: anicteric sclera, MMM ?CV: normal rate, no murmurs, no edema ?Lungs: bilateral chest rise, normal wob ?Abd: soft, non-tender, non-distended ?Skin: no visible lesions or rashes ?Psych: alert, engaged, appropriate mood and affect ?Neuro: normal speech, no gross focal deficits  ?Dialysis access: LUE AVF (in use) ? ?Test Results ?Reviewed ?Lab Results  ?Component Value Date  ? NA 134 (L) 06/15/2021  ? K 6.4 (HH) 06/15/2021  ? CL 94 (L) 06/15/2021  ? CO2 24 06/15/2021  ? BUN 74 (H) 06/15/2021  ? CREATININE 15.38 (H) 06/15/2021  ? CALCIUM 9.5 06/15/2021  ? ALBUMIN 4.1 06/15/2021  ? PHOS 3.1 03/19/2021  ? ? ?I have reviewed relevant outside healthcare records  ? ?

## 2021-06-15 NOTE — H&P (View-Only) (Signed)
Referring Provider: Dr. Roosevelt Locks ?Primary Care Physician:  Merrilee Seashore, MD ?Primary Gastroenterologist:  Dr. Cristina Gong ? ?Reason for Consultation:  Hematemesis ? ?HPI: Jonathan Mcneil is a 66 y.o. male with history of abdominal pain/N/V and s/p EGD in July 2021 that showed 2 small duodenal polyps and otherwise was normal. He reports having multiple episodes of vomiting this morning which was initially clear and then became bloody with clots reportedly in teaspoon to half cup amount. Denies abdominal pain or hematochezia. Reports chronically black stools on iron pills. Took several doses of Ibuprofen last week but reports that he normally does not take NSAIDs. Hgb 11.2. ESRD on hemodialysis. ? ?Past Medical History:  ?Diagnosis Date  ? Cataract   ? Diabetes mellitus without complication ([place])   ? Diabetic retinopathy ([place])   ? ESRD (end stage renal disease) ([place])   ? GERD (gastroesophageal reflux disease)   ? PMH  ? Hypertension   ? Hypertensive retinopathy   ? Low back pain   ? Metabolic acidosis   ? Neuromuscular disorder ([place])   ? peripheral neuropathy  ? Pancreatitis   ? Schizophrenia ([place])   ? does not take medications  ? ? ?Past Surgical History:  ?Procedure Laterality Date  ? A/V FISTULAGRAM Left 06/03/2018  ? Procedure: A/V FISTULAGRAM;  Surgeon: Marty Heck, MD;  Location: [facility];  Service: Cardiovascular;  Laterality: Left;  ? AV FISTULA PLACEMENT Left 02/11/2018  ? Procedure: INSERTION OF ARTERIOVENOUS (AV) FISTULA LEFT  ARM;  Surgeon: [name], MD;  Location: [facility];  Service: Vascular;  Laterality: Left;  ? BASCILIC VEIN TRANSPOSITION Left 04/17/2018  ? Procedure: BASILIC VEIN TRANSPOSITION SECOND STAGE;  Surgeon: [name], MD;  Location: [facility];  Service: Vascular;  Laterality: Left;  ? BIOPSY  10/20/2019  ? Procedure: BIOPSY;  Surgeon: Clarene Essex, MD;  Location: [facility];  Service: Endoscopy;;  ? COLONOSCOPY    ? DENTAL SURGERY    ?  ESOPHAGOGASTRODUODENOSCOPY (EGD) WITH PROPOFOL N/A 10/20/2019  ? Procedure: ESOPHAGOGASTRODUODENOSCOPY (EGD) WITH PROPOFOL;  Surgeon: Clarene Essex, MD;  Location: [facility];  Service: Endoscopy;  Laterality: N/A;  ? INSERTION OF DIALYSIS CATHETER Right 02/25/2018  ? Procedure: INSERTION OF DIALYSIS CATHETER;  Surgeon: [name], MD;  Location: [facility];  Service: Vascular;  Laterality: Right;  ? PERIPHERAL VASCULAR BALLOON ANGIOPLASTY  06/03/2018  ? Procedure: PERIPHERAL VASCULAR BALLOON ANGIOPLASTY;  Surgeon: Marty Heck, MD;  Location: [facility];  Service: Cardiovascular;;  left a/v fistula  ? ? ?Prior to Admission medications   ?Medication Sig Start Date End Date Taking? Authorizing Provider  ?acetaminophen (TYLENOL) 650 MG CR tablet Take 1,300 mg by mouth daily as needed for pain.   Yes [provider]  ?gabapentin (NEURONTIN) 300 MG capsule Take 300 mg by mouth at bedtime. 09/20/20  Yes [provider]  ?sertraline (ZOLOFT) 25 MG tablet Take 25 mg by mouth daily as needed (depression). 11/14/20  Yes [provider]  ?sucroferric oxyhydroxide (VELPHORO) 500 MG chewable tablet Chew 1,500 mg by mouth See admin instructions. 1500mg  before dialysis Tuesday, Thursday, Saturday 10/12/19  Yes [provider]  ?loperamide (IMODIUM A-D) 2 MG tablet Take 1 tablet (2 mg total) by mouth 4 (four) times daily as needed for diarrhea or loose stools. ?Patient not taking: Reported on 06/15/2021 03/20/21   Sharen Hones, MD  ? ? ?Scheduled Meds: ? Chlorhexidine Gluconate Cloth  6 each Topical Q0600  ? gabapentin  300 mg  Oral QHS  ? pantoprazole (PROTONIX) IV  40 mg Intravenous Q12H  ? [START ON 06/16/2021] sucroferric oxyhydroxide  1,500 mg Oral Q T,Th,Sa-HD  ? ?Continuous Infusions: ?PRN Meds:.acetaminophen, ondansetron **OR** ondansetron (ZOFRAN) IV, sertraline ? ?Allergies as of 06/15/2021 - Review Complete 06/15/2021  ?Allergen Reaction Noted  ? Gabapentin Nausea  Only 06/15/2021  ? Ibuprofen Hives 02/14/2015  ? Penicillins Itching and Other (See Comments) 02/11/2012  ? Venlafaxine Other (See Comments) 12/06/2020  ? ? ?Family History  ?Problem Relation Age of Onset  ? Kidney failure Mother   ? Diabetes Father   ? Blindness Brother   ? ? ?Social History  ? ?Socioeconomic History  ? Marital status: Married  ?  Spouse name: Not on file  ? Number of children: Not on file  ? Years of education: Not on file  ? Highest education level: Not on file  ?Occupational History  ? Occupation: Mudlogger  ?Tobacco Use  ? Smoking status: Former  ? Smokeless tobacco: Never  ?Vaping Use  ? Vaping Use: Never used  ?Substance and Sexual Activity  ? Alcohol use: No  ? Drug use: Not Currently  ?  Types: Cocaine, Marijuana  ?  Comment: smoked marijuana a few days ago; he denies using cocaine  ? Sexual activity: Not Currently  ?Other Topics Concern  ? Not on file  ?Social History Narrative  ? Not on file  ? ?Social Determinants of Health  ? ?Financial Resource Strain: Not on file  ?Food Insecurity: Not on file  ?Transportation Needs: Not on file  ?Physical Activity: Not on file  ?Stress: Not on file  ?Social Connections: Not on file  ?Intimate Partner Violence: Not on file  ? ? ?Review of Systems: All negative except as stated above in HPI. ? ?Physical Exam: ?Vital signs: ?Vitals:  ? 06/15/21 1711 06/15/21 1715  ?BP: (!) 143/87 140/88  ?Pulse: 74 76  ?Resp: 16 18  ?Temp: 98 ?F (36.7 ?C) 98 ?F (36.7 ?C)  ?SpO2:    ? ?  ?General:   Lethargic, elderly, Well-developed, well-nourished, pleasant and cooperative in NAD ?Head: normocephalic, atraumatic ?Eyes: anicteric sclera ?ENT: oropharynx clear ?Neck: supple, nontender ?Lungs:  Clear throughout to auscultation.   No wheezes, crackles, or rhonchi. No acute distress. ?Heart:  Regular rate and rhythm; no murmurs, clicks, rubs,  or gallops. ?Abdomen: soft, nontender, nondistended, +BS  ?Rectal:  Deferred ?Ext: no edema ? ?GI:  ?Lab  Results: ?Recent Labs  ?  06/15/21 ?8119 06/15/21 ?1020  ?WBC 5.9  --   ?HGB 10.3* 11.2*  ?HCT 32.2* 33.0*  ?PLT 147*  --   ? ?BMET ?Recent Labs  ?  06/15/21 ?1478 06/15/21 ?1020  ?NA 137 134*  ?K 5.9* 6.4*  ?CL 94*  --   ?CO2 24  --   ?GLUCOSE 125*  --   ?BUN 74*  --   ?CREATININE 15.38*  --   ?CALCIUM 9.5  --   ? ?LFT ?Recent Labs  ?  06/15/21 ?0844  ?PROT 8.2*  ?ALBUMIN 4.1  ?AST 15  ?ALT 14  ?ALKPHOS 94  ?BILITOT 1.5*  ? ?PT/INR ?Recent Labs  ?  06/15/21 ?1007  ?LABPROT 16.1*  ?INR 1.3*  ? ? ? ?Studies/Results: ?CT Abdomen Pelvis Wo Contrast ? ?Result Date: 06/15/2021 ?CLINICAL DATA:  66 year old male with abdominal pain EXAM: CT ABDOMEN AND PELVIS WITHOUT CONTRAST TECHNIQUE: Multidetector CT imaging of the abdomen and pelvis was performed following the standard protocol without IV contrast. RADIATION DOSE REDUCTION: This  exam was performed according to the departmental dose-optimization program which includes automated exposure control, adjustment of the mA and/or kV according to patient size and/or use of iterative reconstruction technique. COMPARISON:  03/19/2021 FINDINGS: Lower chest: Small right-sided pleural effusion with associated atelectasis. Redemonstration of geographic ground-glass attenuation in the dependent lower lungs, which was present on more remote chest CT, and again favored to represent airway trapping/small airway disease. No endobronchial debris or bronchial wall thickening. No interlobular septal thickening. Hepatobiliary: Unremarkable liver.  Unremarkable gallbladder. Pancreas: Unremarkable Spleen: Unremarkable Adrenals/Urinary Tract: Unremarkable bilateral adrenal glands. Similar appearance of renal cortical thinning. No evidence of left-sided or right-sided hydronephrosis. No nephrolithiasis. Urinary bladder relatively decompressed. Stomach/Bowel: Unremarkable stomach. Small bowel decompressed. No transition point normal appendix. Colon decompressed. No inflammatory changes. Mild  left-sided diverticular change. Vascular/Lymphatic: Atherosclerotic changes of the abdominal aorta. No aneurysm. No adenopathy. Reproductive: Transverse diameter of the prostate measures 4.8 cm. Other: Redemonstration of metallic

## 2021-06-15 NOTE — ED Notes (Signed)
Lab aware blood needed. ?

## 2021-06-15 NOTE — ED Notes (Signed)
Pt complains of SOB. O2 99% and RR 22.  ? ?RN raised HOB and placed 2L nasal canula for comfort. ?

## 2021-06-15 NOTE — Consult Note (Signed)
Referring Provider: Dr. Roosevelt Locks ?Primary Care Physician:  Merrilee Seashore, MD ?Primary Gastroenterologist:  Dr. Cristina Gong ? ?Reason for Consultation:  Hematemesis ? ?HPI: Jonathan Mcneil is a 66 y.o. male with history of abdominal pain/N/V and s/p EGD in July 2021 that showed 2 small duodenal polyps and otherwise was normal. He reports having multiple episodes of vomiting this morning which was initially clear and then became bloody with clots reportedly in teaspoon to half cup amount. Denies abdominal pain or hematochezia. Reports chronically black stools on iron pills. Took several doses of Ibuprofen last week but reports that he normally does not take NSAIDs. Hgb 11.2. ESRD on hemodialysis. ? ?Past Medical History:  ?Diagnosis Date  ? Cataract   ? Diabetes mellitus without complication ([place])   ? Diabetic retinopathy ([place])   ? ESRD (end stage renal disease) ([place])   ? GERD (gastroesophageal reflux disease)   ? PMH  ? Hypertension   ? Hypertensive retinopathy   ? Low back pain   ? Metabolic acidosis   ? Neuromuscular disorder ([place])   ? peripheral neuropathy  ? Pancreatitis   ? Schizophrenia ([place])   ? does not take medications  ? ? ?Past Surgical History:  ?Procedure Laterality Date  ? A/V FISTULAGRAM Left 06/03/2018  ? Procedure: A/V FISTULAGRAM;  Surgeon: Marty Heck, MD;  Location: [facility];  Service: Cardiovascular;  Laterality: Left;  ? AV FISTULA PLACEMENT Left 02/11/2018  ? Procedure: INSERTION OF ARTERIOVENOUS (AV) FISTULA LEFT  ARM;  Surgeon: [name], MD;  Location: [facility];  Service: Vascular;  Laterality: Left;  ? BASCILIC VEIN TRANSPOSITION Left 04/17/2018  ? Procedure: BASILIC VEIN TRANSPOSITION SECOND STAGE;  Surgeon: [name], MD;  Location: [facility];  Service: Vascular;  Laterality: Left;  ? BIOPSY  10/20/2019  ? Procedure: BIOPSY;  Surgeon: Clarene Essex, MD;  Location: [facility];  Service: Endoscopy;;  ? COLONOSCOPY    ? DENTAL SURGERY    ?  ESOPHAGOGASTRODUODENOSCOPY (EGD) WITH PROPOFOL N/A 10/20/2019  ? Procedure: ESOPHAGOGASTRODUODENOSCOPY (EGD) WITH PROPOFOL;  Surgeon: Clarene Essex, MD;  Location: [facility];  Service: Endoscopy;  Laterality: N/A;  ? INSERTION OF DIALYSIS CATHETER Right 02/25/2018  ? Procedure: INSERTION OF DIALYSIS CATHETER;  Surgeon: [name], MD;  Location: [facility];  Service: Vascular;  Laterality: Right;  ? PERIPHERAL VASCULAR BALLOON ANGIOPLASTY  06/03/2018  ? Procedure: PERIPHERAL VASCULAR BALLOON ANGIOPLASTY;  Surgeon: Marty Heck, MD;  Location: [facility];  Service: Cardiovascular;;  left a/v fistula  ? ? ?Prior to Admission medications   ?Medication Sig Start Date End Date Taking? Authorizing Provider  ?acetaminophen (TYLENOL) 650 MG CR tablet Take 1,300 mg by mouth daily as needed for pain.   Yes [provider]  ?gabapentin (NEURONTIN) 300 MG capsule Take 300 mg by mouth at bedtime. 09/20/20  Yes [provider]  ?sertraline (ZOLOFT) 25 MG tablet Take 25 mg by mouth daily as needed (depression). 11/14/20  Yes [provider]  ?sucroferric oxyhydroxide (VELPHORO) 500 MG chewable tablet Chew 1,500 mg by mouth See admin instructions. 1500mg  before dialysis Tuesday, Thursday, Saturday 10/12/19  Yes [provider]  ?loperamide (IMODIUM A-D) 2 MG tablet Take 1 tablet (2 mg total) by mouth 4 (four) times daily as needed for diarrhea or loose stools. ?Patient not taking: Reported on 06/15/2021 03/20/21   Sharen Hones, MD  ? ? ?Scheduled Meds: ? Chlorhexidine Gluconate Cloth  6 each Topical Q0600  ? gabapentin  300 mg  Oral QHS  ? pantoprazole (PROTONIX) IV  40 mg Intravenous Q12H  ? [START ON 06/16/2021] sucroferric oxyhydroxide  1,500 mg Oral Q T,Th,Sa-HD  ? ?Continuous Infusions: ?PRN Meds:.acetaminophen, ondansetron **OR** ondansetron (ZOFRAN) IV, sertraline ? ?Allergies as of 06/15/2021 - Review Complete 06/15/2021  ?Allergen Reaction Noted  ? Gabapentin Nausea  Only 06/15/2021  ? Ibuprofen Hives 02/14/2015  ? Penicillins Itching and Other (See Comments) 02/11/2012  ? Venlafaxine Other (See Comments) 12/06/2020  ? ? ?Family History  ?Problem Relation Age of Onset  ? Kidney failure Mother   ? Diabetes Father   ? Blindness Brother   ? ? ?Social History  ? ?Socioeconomic History  ? Marital status: Married  ?  Spouse name: Not on file  ? Number of children: Not on file  ? Years of education: Not on file  ? Highest education level: Not on file  ?Occupational History  ? Occupation: Mudlogger  ?Tobacco Use  ? Smoking status: Former  ? Smokeless tobacco: Never  ?Vaping Use  ? Vaping Use: Never used  ?Substance and Sexual Activity  ? Alcohol use: No  ? Drug use: Not Currently  ?  Types: Cocaine, Marijuana  ?  Comment: smoked marijuana a few days ago; he denies using cocaine  ? Sexual activity: Not Currently  ?Other Topics Concern  ? Not on file  ?Social History Narrative  ? Not on file  ? ?Social Determinants of Health  ? ?Financial Resource Strain: Not on file  ?Food Insecurity: Not on file  ?Transportation Needs: Not on file  ?Physical Activity: Not on file  ?Stress: Not on file  ?Social Connections: Not on file  ?Intimate Partner Violence: Not on file  ? ? ?Review of Systems: All negative except as stated above in HPI. ? ?Physical Exam: ?Vital signs: ?Vitals:  ? 06/15/21 1711 06/15/21 1715  ?BP: (!) 143/87 140/88  ?Pulse: 74 76  ?Resp: 16 18  ?Temp: 98 ?F (36.7 ?C) 98 ?F (36.7 ?C)  ?SpO2:    ? ?  ?General:   Lethargic, elderly, Well-developed, well-nourished, pleasant and cooperative in NAD ?Head: normocephalic, atraumatic ?Eyes: anicteric sclera ?ENT: oropharynx clear ?Neck: supple, nontender ?Lungs:  Clear throughout to auscultation.   No wheezes, crackles, or rhonchi. No acute distress. ?Heart:  Regular rate and rhythm; no murmurs, clicks, rubs,  or gallops. ?Abdomen: soft, nontender, nondistended, +BS  ?Rectal:  Deferred ?Ext: no edema ? ?GI:  ?Lab  Results: ?Recent Labs  ?  06/15/21 ?1610 06/15/21 ?1020  ?WBC 5.9  --   ?HGB 10.3* 11.2*  ?HCT 32.2* 33.0*  ?PLT 147*  --   ? ?BMET ?Recent Labs  ?  06/15/21 ?9604 06/15/21 ?1020  ?NA 137 134*  ?K 5.9* 6.4*  ?CL 94*  --   ?CO2 24  --   ?GLUCOSE 125*  --   ?BUN 74*  --   ?CREATININE 15.38*  --   ?CALCIUM 9.5  --   ? ?LFT ?Recent Labs  ?  06/15/21 ?0844  ?PROT 8.2*  ?ALBUMIN 4.1  ?AST 15  ?ALT 14  ?ALKPHOS 94  ?BILITOT 1.5*  ? ?PT/INR ?Recent Labs  ?  06/15/21 ?1007  ?LABPROT 16.1*  ?INR 1.3*  ? ? ? ?Studies/Results: ?CT Abdomen Pelvis Wo Contrast ? ?Result Date: 06/15/2021 ?CLINICAL DATA:  66 year old male with abdominal pain EXAM: CT ABDOMEN AND PELVIS WITHOUT CONTRAST TECHNIQUE: Multidetector CT imaging of the abdomen and pelvis was performed following the standard protocol without IV contrast. RADIATION DOSE REDUCTION: This  exam was performed according to the departmental dose-optimization program which includes automated exposure control, adjustment of the mA and/or kV according to patient size and/or use of iterative reconstruction technique. COMPARISON:  03/19/2021 FINDINGS: Lower chest: Small right-sided pleural effusion with associated atelectasis. Redemonstration of geographic ground-glass attenuation in the dependent lower lungs, which was present on more remote chest CT, and again favored to represent airway trapping/small airway disease. No endobronchial debris or bronchial wall thickening. No interlobular septal thickening. Hepatobiliary: Unremarkable liver.  Unremarkable gallbladder. Pancreas: Unremarkable Spleen: Unremarkable Adrenals/Urinary Tract: Unremarkable bilateral adrenal glands. Similar appearance of renal cortical thinning. No evidence of left-sided or right-sided hydronephrosis. No nephrolithiasis. Urinary bladder relatively decompressed. Stomach/Bowel: Unremarkable stomach. Small bowel decompressed. No transition point normal appendix. Colon decompressed. No inflammatory changes. Mild  left-sided diverticular change. Vascular/Lymphatic: Atherosclerotic changes of the abdominal aorta. No aneurysm. No adenopathy. Reproductive: Transverse diameter of the prostate measures 4.8 cm. Other: Redemonstration of metallic

## 2021-06-15 NOTE — Progress Notes (Signed)
NEW ADMISSION NOTE ?New Admission Note:  ? ?Arrival Method: ED stretcher ?Mental Orientation: AAOx4 ?Telemetry: see chart ?Assessment: Completed ?Skin: see chart ?IV: RH ?Pain:0/10 ?Tubes: n/a ?Safety Measures: Safety Fall Prevention Plan has been given, discussed and signed ?Admission: Completed ?[facility] Orientation: Patient has been orientated to the room, unit and staff.  ?Family: none at bedside ? ?Orders have been reviewed and implemented. Will continue to monitor the patient. Call light has been placed within reach and bed alarm has been activated.  ? ?Vira Agar, RN   ?

## 2021-06-15 NOTE — ED Triage Notes (Signed)
[facility] reports pt coming from home. Pt missed dialysis on Thursday for unknown reason. Pt has been having n/v/d as well. ?

## 2021-06-16 ENCOUNTER — Encounter (HOSPITAL_COMMUNITY)
Admission: EM | Disposition: A | Discharge status: Home / Self Care | Payer: Self-pay | Source: Home / Self Care | Attending: Internal Medicine

## 2021-06-16 ENCOUNTER — Encounter (HOSPITAL_COMMUNITY): Payer: Self-pay | Admitting: Internal Medicine

## 2021-06-16 ENCOUNTER — Inpatient Hospital Stay (HOSPITAL_COMMUNITY): Payer: Medicare Other | Admitting: Certified Registered"

## 2021-06-16 DIAGNOSIS — K2971 Gastritis, unspecified, with bleeding: Secondary | ICD-10-CM

## 2021-06-16 DIAGNOSIS — E1122 Type 2 diabetes mellitus with diabetic chronic kidney disease: Secondary | ICD-10-CM

## 2021-06-16 DIAGNOSIS — N186 End stage renal disease: Secondary | ICD-10-CM

## 2021-06-16 DIAGNOSIS — K3189 Other diseases of stomach and duodenum: Secondary | ICD-10-CM

## 2021-06-16 DIAGNOSIS — I120 Hypertensive chronic kidney disease with stage 5 chronic kidney disease or end stage renal disease: Secondary | ICD-10-CM

## 2021-06-16 DIAGNOSIS — R197 Diarrhea, unspecified: Secondary | ICD-10-CM

## 2021-06-16 DIAGNOSIS — R112 Nausea with vomiting, unspecified: Secondary | ICD-10-CM

## 2021-06-16 DIAGNOSIS — Z992 Dependence on renal dialysis: Secondary | ICD-10-CM

## 2021-06-16 DIAGNOSIS — K920 Hematemesis: Secondary | ICD-10-CM

## 2021-06-16 DIAGNOSIS — K2901 Acute gastritis with bleeding: Secondary | ICD-10-CM

## 2021-06-16 HISTORY — PX: ESOPHAGOGASTRODUODENOSCOPY: SHX5428

## 2021-06-16 LAB — CBC
HCT: 30.3 % — ABNORMAL LOW (ref 39.0–52.0)
Hemoglobin: 10 g/dL — ABNORMAL LOW (ref 13.0–17.0)
MCH: 33.3 pg (ref 26.0–34.0)
MCHC: 33 g/dL (ref 30.0–36.0)
MCV: 101 fL — ABNORMAL HIGH (ref 80.0–100.0)
Platelets: 143 10*3/uL — ABNORMAL LOW (ref 150–400)
RBC: 3 MIL/uL — ABNORMAL LOW (ref 4.22–5.81)
RDW: 14.3 % (ref 11.5–15.5)
WBC: 4.3 10*3/uL (ref 4.0–10.5)
nRBC: 0 % (ref 0.0–0.2)

## 2021-06-16 LAB — HEPATITIS B CORE ANTIBODY, TOTAL: Hep B Core Total Ab: NONREACTIVE

## 2021-06-16 LAB — HEPATITIS B SURFACE ANTIGEN: Hepatitis B Surface Ag: NONREACTIVE

## 2021-06-16 LAB — BASIC METABOLIC PANEL
Anion gap: 13 (ref 5–15)
BUN: 32 mg/dL — ABNORMAL HIGH (ref 8–23)
CO2: 27 mmol/L (ref 22–32)
Calcium: 9 mg/dL (ref 8.9–10.3)
Chloride: 94 mmol/L — ABNORMAL LOW (ref 98–111)
Creatinine, Ser: 9.32 mg/dL — ABNORMAL HIGH (ref 0.61–1.24)
GFR, Estimated: 6 mL/min — ABNORMAL LOW (ref >60)
Glucose, Bld: 88 mg/dL (ref 70–99)
Potassium: 4.5 mmol/L (ref 3.5–5.1)
Sodium: 134 mmol/L — ABNORMAL LOW (ref 135–145)

## 2021-06-16 SURGERY — EGD (ESOPHAGOGASTRODUODENOSCOPY)
Anesthesia: Monitor Anesthesia Care

## 2021-06-16 MED ORDER — SODIUM CHLORIDE 0.9 % IV SOLN: INTRAVENOUS | Status: DC

## 2021-06-16 MED ORDER — LIDOCAINE 2% (20 MG/ML) 5 ML SYRINGE
INTRAMUSCULAR | Status: DC | PRN
Start: 2021-06-16 — End: 2021-06-16
  Administered 2021-06-16: 60 mg via INTRAVENOUS

## 2021-06-16 MED ORDER — SUCROFERRIC OXYHYDROXIDE 500 MG PO CHEW
1500.0000 mg | CHEWABLE_TABLET | Freq: Three times a day (TID) | ORAL | Status: DC
  Administered 2021-06-16 – 2021-06-17 (×3): 1500 mg via ORAL
  Filled 2021-06-16 (×6): qty 3

## 2021-06-16 MED ORDER — PROPOFOL 500 MG/50ML IV EMUL
INTRAVENOUS | Status: DC | PRN
Start: 2021-06-16 — End: 2021-06-16
  Administered 2021-06-16: 50 ug/kg/min via INTRAVENOUS

## 2021-06-16 MED ORDER — CALCITRIOL 0.5 MCG PO CAPS
2.0000 ug | ORAL_CAPSULE | ORAL | Status: DC
  Administered 2021-06-16: 2 ug via ORAL
  Filled 2021-06-16: qty 4

## 2021-06-16 MED ORDER — PROPOFOL 10 MG/ML IV BOLUS
INTRAVENOUS | Status: DC | PRN
Start: 2021-06-16 — End: 2021-06-16
  Administered 2021-06-16 (×2): 10 mg via INTRAVENOUS

## 2021-06-16 NOTE — Assessment & Plan Note (Signed)
-   HbA1c 5.3 in 02/2021, well controlled ?

## 2021-06-16 NOTE — Progress Notes (Signed)
? ? ? [facility] ?                                                                            ? ? ?Jonathan Mcneil, is a 66 y.o. male, DOB - [birth year], UXL:[id number] ?Admit date - 06/15/2021    ?Outpatient Primary MD for the patient is Merrilee Seashore, MD ? ?LOS - 1  days ? ? ? ?Brief summary  ? ?Patient is a 66 year old male with ESRD on HD TTS, hypertension, chronic neuropathy, GERD presented with new onset hematemesis.  Patient has repeated hospitalization for recurrent nausea and vomiting for last 2 years.  In the past work-up etiology either viral gastroenteritis versus marijuana.  Presented with frequent episodes of nausea and vomiting which subsequently became bloody, freshly red blood.  No chest pain, no lightheadedness or shortness of breath.  He had mild epigastric discomfort. ? ?Patient missed his HD due to feeling nauseous on 3/23. ?Creatinine 15, potassium 6.4.  Patient was admitted. ? ? ?Assessment & Plan  ? ? ?Assessment and Plan: ?* GI bleed ?- Presented with hematemesis, upper GI bleed ?-GI consulted, underwent EGD with minimal gastritis and mild duodenitis.  Per GI superficial mucosal irritation causing blood in the vomitus, has not reoccurred. ?-Continue p.o. twice daily PPI ? ?Hematemesis ?- As above #GI bleed ? ?ESRD (end stage renal disease) ([place]) ?- On hemodialysis, TTS.  Nephrology consulted, dialysis today ? ?Hyperkalemia ?- Presented with potassium of 6.4, improved to 4.5 after HD, on 3/24 ? ?Type 2 diabetes mellitus with stage 4 chronic kidney disease ([place]) ?- HbA1c 5.3 in 02/2021, well controlled ? ? ?Code Status: Full CODE STATUS ?DVT Prophylaxis:  Place and maintain sequential compression device Start: 06/15/21 1522 ? ? ?Level of Care: Level of care: Telemetry Medical ?Family Communication: ? ?Disposition Plan:     Remains inpatient appropriate: HD pending today, hopefully DC home in a.m. if CBC stable and no further hematemesis ? ?Procedures:  ?HD ?EGD ? ?Consultants:    ?GI ?Nephrology ? ?Antimicrobials:  ?Medications ? ? calcitRIOL  2 mcg Oral Q T,Th,Sa-HD  ? Chlorhexidine Gluconate Cloth  6 each Topical Q0600  ? gabapentin  300 mg Oral QHS  ? pantoprazole (PROTONIX) IV  40 mg Intravenous Q12H  ? sucroferric oxyhydroxide  1,500 mg Oral TID WC  ? ? ? ?Subjective:  ? ?Jonathan Mcneil was seen and examined today.  Return from EGD, no acute complaints, tolerating diet.  No repeat nausea vomiting with hematemesis.   ? ?Objective:  ? ?Vitals:  ? 06/16/21 0827 06/16/21 1045 06/16/21 1100 06/16/21 1124  ?BP: 140/90 106/61 121/74 107/75  ?Pulse: 75 65 70 67  ?Resp: (!) 21 17 16 17   ?Temp:  98 ?F (36.7 ?C)  98.3 ?F (36.8 ?C)  ?TempSrc: Temporal Axillary    ?SpO2: 100% 94% 97% 100%  ?Weight: 90.8 kg     ?Height: 5\' 9"  (1.753 m)     ? ? ?Intake/Output Summary (Last 24 hours) at 06/16/2021 1333 ?Last data filed at 06/16/2021 1049 ?Gross per 24 hour  ?Intake 315 ml  ?Output 3500 ml  ?Net -3185 ml  ? ?Filed Weights  ? 06/15/21 1743 06/16/21 0422 06/16/21 0827  ?Weight: 89.5 kg  90.8 kg 90.8 kg  ? ? ? ?Exam ?General: Alert and oriented x 3, NAD ?Cardiovascular: S1 S2 auscultated, RRR ?Respiratory: Clear to auscultation bilaterally, no wheezing ?Gastrointestinal: Soft, nontender, nondistended, + bowel sounds ?Ext: no pedal edema bilaterally ?Neuro: no new deficits ?Psych: Normal affect and demeanor, alert and oriented x3  ? ? ?Data Reviewed:  I have personally reviewed following labs  ? ? ?CBC ?Lab Results  ?Component Value Date  ? WBC 4.3 06/16/2021  ? RBC 3.00 (L) 06/16/2021  ? HGB 10.0 (L) 06/16/2021  ? HCT 30.3 (L) 06/16/2021  ? MCV 101.0 (H) 06/16/2021  ? MCH 33.3 06/16/2021  ? PLT 143 (L) 06/16/2021  ? MCHC 33.0 06/16/2021  ? RDW 14.3 06/16/2021  ? LYMPHSABS 0.8 06/15/2021  ? MONOABS 0.4 06/15/2021  ? EOSABS 0.2 06/15/2021  ? BASOSABS 0.0 06/15/2021  ? ? ? ?Last metabolic panel ?Lab Results  ?Component Value Date  ? NA 134 (L) 06/16/2021  ? K 4.5 06/16/2021  ? CL 94 (L) 06/16/2021  ? CO2 27  06/16/2021  ? BUN 32 (H) 06/16/2021  ? CREATININE 9.32 (H) 06/16/2021  ? GLUCOSE 88 06/16/2021  ? GFRNONAA 6 (L) 06/16/2021  ? GFRAA 6 (L) 10/19/2019  ? CALCIUM 9.0 06/16/2021  ? PHOS 3.1 03/19/2021  ? PROT 8.2 (H) 06/15/2021  ? ALBUMIN 4.1 06/15/2021  ? BILITOT 1.5 (H) 06/15/2021  ? ALKPHOS 94 06/15/2021  ? AST 15 06/15/2021  ? ALT 14 06/15/2021  ? ANIONGAP 13 06/16/2021  ? ? ?CBG (last 3)  ?Recent Labs  ?  06/15/21 ?1112  ?GLUCAP 157*  ?  ? ? ?Coagulation Profile: ?Recent Labs  ?Lab 06/15/21 ?1007  ?INR 1.3*  ? ? ? ?Radiology Studies: I have personally reviewed the imaging studies  ?CT Abdomen Pelvis Wo Contrast ? ?Result Date: 06/15/2021 ?IMPRESSION: No acute finding of the abdomen to account for abdominal pain. Small right-sided pleural effusion Aortic Atherosclerosis (ICD10-I70.0). Additional ancillary findings as above. Electronically Signed   By: Corrie Mckusick D.O.   On: 06/15/2021 12:02  ? ?DG Chest Port 1 View ? ?Result Date: 06/15/2021 ? IMPRESSION: Mild pulmonary edema, likely cardiogenic given cardiomegaly. Electronically Signed   By: Ruthann Cancer M.D.   On: 06/15/2021 10:10   ? ? ? ? ?Estill Cotta M.D. ?[facility] ?06/16/2021, 1:33 PM ? ?Available via Epic secure chat 7am-7pm ?After 7 pm, please refer to night coverage provider listed on amion. ? ?  ?

## 2021-06-16 NOTE — Brief Op Note (Signed)
See endopro note for details. Minimal gastritis and mild duodenitis. Suspect superficial mucosal irritation causing blood in vomitus that has not recurred. PO PPI BID ok if tolerating renal diet. No further GI w/u planned. Will sign off. Call if questions. ?

## 2021-06-16 NOTE — Anesthesia Postprocedure Evaluation (Signed)
Anesthesia Post Note ? ?Patient: Jonathan Mcneil ? ?Procedure(s) Performed: ESOPHAGOGASTRODUODENOSCOPY (EGD) ? ?  ? ?Patient location during evaluation: PACU ?Anesthesia Type: MAC ?Level of consciousness: awake and alert ?Pain management: pain level controlled ?Vital Signs Assessment: post-procedure vital signs reviewed and stable ?Respiratory status: spontaneous breathing, nonlabored ventilation, respiratory function stable and patient connected to nasal cannula oxygen ?Cardiovascular status: stable and blood pressure returned to baseline ?Postop Assessment: no apparent nausea or vomiting ?Anesthetic complications: no ? ? ?No notable events documented. ? ?Last Vitals:  ?Vitals:  ? 06/16/21 1045 06/16/21 1100  ?BP: 106/61 121/74  ?Pulse: 65 70  ?Resp: 17 16  ?Temp: 36.7 ?C   ?SpO2: 94% 97%  ?  ?Last Pain:  ?Vitals:  ? 06/16/21 1100  ?TempSrc:   ?PainSc: 0-No pain  ? ? ?  ?  ?  ?  ?  ?  ? ?Suzette Battiest E ? ? ? ? ?

## 2021-06-16 NOTE — Assessment & Plan Note (Addendum)
-   Presented with potassium of 6.4, improved to 4.5 after HD, on 3/24, received HD on 3/25 again ?

## 2021-06-16 NOTE — Interval H&P Note (Signed)
History and Physical Interval Note: ? ?06/16/2021 ?10:24 AM ? ?Jonathan Mcneil  has presented today for surgery, with the diagnosis of GIB.  The various methods of treatment have been discussed with the patient and family. After consideration of risks, benefits and other options for treatment, the patient has consented to  Procedure(s): ?ESOPHAGOGASTRODUODENOSCOPY (EGD) (N/A) as a surgical intervention.  The patient's history has been reviewed, patient examined, no change in status, stable for surgery.  I have reviewed the patient's chart and labs.  Questions were answered to the patient's satisfaction.   ? ? ?Lear Ng ? ? ?

## 2021-06-16 NOTE — Transfer of Care (Signed)
Immediate Anesthesia Transfer of Care Note ? ?Patient: Jonathan Mcneil ? ?Procedure(s) Performed: ESOPHAGOGASTRODUODENOSCOPY (EGD) ? ?Patient Location: PACU ? ?Anesthesia Type:MAC ? ?Level of Consciousness: sedated, drowsy and patient cooperative ? ?Airway & Oxygen Therapy: Patient Spontanous Breathing and Patient connected to nasal cannula oxygen ? ?Post-op Assessment: Report given to RN and Post -op Vital signs reviewed and stable ? ?Post vital signs: Reviewed and stable ? ?Last Vitals:  ?Vitals Value Taken Time  ?BP    ?Temp    ?Pulse    ?Resp    ?SpO2    ? ? ?Last Pain:  ?Vitals:  ? 06/16/21 0827  ?TempSrc: Temporal  ?PainSc: 0-No pain  ?   ? ?  ? ?Complications: No notable events documented. ?

## 2021-06-16 NOTE — Anesthesia Preprocedure Evaluation (Signed)
Anesthesia Evaluation  ?Patient identified by MRN, date of birth, ID band ?Patient awake ? ? ? ?Reviewed: ?Allergy & Precautions, NPO status , Patient's Chart, lab work & pertinent test results ? ?Airway ?Mallampati: II ? ?TM Distance: >3 FB ?Neck ROM: Full ? ? ? Dental ? ?(+) Dental Advisory Given ?  ?Pulmonary ?former smoker,  ?  ?breath sounds clear to auscultation ? ? ? ? ? ? Cardiovascular ?hypertension, Pt. on medications ? ?Rhythm:Regular Rate:Normal ? ? ?  ?Neuro/Psych ? Neuromuscular disease   ? GI/Hepatic ?Neg liver ROS, GERD  ,UGI bleed ?  ?Endo/Other  ?diabetes, Type 2 ? Renal/GU ?ESRF and DialysisRenal disease  ? ?  ?Musculoskeletal ? ? Abdominal ?  ?Peds ? Hematology ? ?(+) Blood dyscrasia, anemia ,   ?Anesthesia Other Findings ? ? Reproductive/Obstetrics ? ?  ? ? ? ? ? ? ? ? ? ? ? ? ? ?  ?  ? ? ? ? ? ? ? ? ?Anesthesia Physical ?Anesthesia Plan ? ?ASA: 4 ? ?Anesthesia Plan: MAC  ? ?Post-op Pain Management: Minimal or no pain anticipated  ? ?Induction:  ? ?PONV Risk Score and Plan: 1 and Propofol infusion and Treatment may vary due to age or medical condition ? ?Airway Management Planned: Natural Airway and Nasal Cannula ? ?Additional Equipment: None ? ?Intra-op Plan:  ? ?Post-operative Plan:  ? ?Informed Consent: I have reviewed the patients History and Physical, chart, labs and discussed the procedure including the risks, benefits and alternatives for the proposed anesthesia with the patient or authorized representative who has indicated his/her understanding and acceptance.  ? ? ? ? ? ?Plan Discussed with:  ? ?Anesthesia Plan Comments:   ? ? ? ? ? ? ?Anesthesia Quick Evaluation ? ?

## 2021-06-16 NOTE — Progress Notes (Signed)
Pt off the unit in endo ? ?

## 2021-06-16 NOTE — Assessment & Plan Note (Addendum)
-   Presented with hematemesis, upper GI bleed ?-GI consulted, underwent EGD with minimal gastritis and mild duodenitis.  Per GI superficial mucosal irritation causing blood in the vomitus, has not reoccurred. ?-Continue p.o. twice daily PPI and outpatient follow-up with GI ?-No repeat hematemesis, nausea or vomiting, tolerating diet ?-Hemoglobin 11.5 at discharge ?

## 2021-06-16 NOTE — Progress Notes (Signed)
?[facility] ?Progress Note  ? ? ?Assessment/ Plan:   ?# ESRD:  ?-outpatient HD orders: [facility]TS. 4.5hrs, f180, BFR 550/autoflow 1.5, edw 94.5kg, 2k, 2.5ca, UF profile 4. Meds: calcitriol 21mcg qtreatment ?-Maintain TTS schedule, hd today ?  ?# N/V/D, hematemesis ?-per primary service and GI. S/p egd 3/25 with gastritis otherwise normal esophagus ?  ?# Hyperkalemia ?-resolved w/ HD ?  ?# Volume/ hypertension: EDW 94.5kg. UF as tolerated ?  ?# Anemia of Chronic Kidney Disease: Hemoglobin down to 10. Not on ESAs as an outpatient, will monitor in the interim. Iron profile ordered ?  ?# Secondary Hyperparathyroidism/Hyperphosphatemia: c/w velphoro, will resume calcitriol  ?  ?# Vascular access: LUE AVF +b/t ?  ?# Additional recommendations: ?- Dose all meds for creatinine clearance < 10 ml/min  ?- Unless absolutely necessary, no MRIs with gadolinium.  ?- Implement save arm precautions.  Prefer needle sticks in the dorsum of the hands or wrists.  No blood pressure measurements in arm. ?- If blood transfusion is requested during hemodialysis sessions, please alert [place] prior to the session.  ?- If a hemodialysis catheter line culture is requested, please alert [place] as only hemodialysis nurses are able to collect those specimens.  ? ?Gean Quint, MD ?[facility] ? ?Subjective:   ?S/p egd today, tolerated procedure well. Feels hungry now. Open for hd later today  ? ?Objective:   ?BP 107/75 (BP Location: Right Arm)   Pulse 67   Temp 98.3 ?F (36.8 ?C)   Resp 17   Ht 5\' 9"  (1.753 m)   Wt 90.8 kg   SpO2 100%   BMI 29.56 kg/m?  ? ?Intake/Output Summary (Last 24 hours) at 06/16/2021 1218 ?Last data filed at 06/16/2021 1049 ?Gross per 24 hour  ?Intake 315 ml  ?Output 3500 ml  ?Net -3185 ml  ? ?Weight change:  ? ?Physical Exam: ?Gen:nad, laying flat in bed ?CVS:rrr ?Resp:normal wob ?AST:MHDQ ?QIW:LNLGX pitting edema b/l LE's ?Neuro: awake, alert, nonfocal ?Dialysis access: LUE AVF  +b/t ? ?Imaging: ?CT Abdomen Pelvis Wo Contrast ? ?Result Date: 06/15/2021 ?CLINICAL DATA:  66 year old male with abdominal pain EXAM: CT ABDOMEN AND PELVIS WITHOUT CONTRAST TECHNIQUE: Multidetector CT imaging of the abdomen and pelvis was performed following the standard protocol without IV contrast. RADIATION DOSE REDUCTION: This exam was performed according to the departmental dose-optimization program which includes automated exposure control, adjustment of the mA and/or kV according to patient size and/or use of iterative reconstruction technique. COMPARISON:  03/19/2021 FINDINGS: Lower chest: Small right-sided pleural effusion with associated atelectasis. Redemonstration of geographic ground-glass attenuation in the dependent lower lungs, which was present on more remote chest CT, and again favored to represent airway trapping/small airway disease. No endobronchial debris or bronchial wall thickening. No interlobular septal thickening. Hepatobiliary: Unremarkable liver.  Unremarkable gallbladder. Pancreas: Unremarkable Spleen: Unremarkable Adrenals/Urinary Tract: Unremarkable bilateral adrenal glands. Similar appearance of renal cortical thinning. No evidence of left-sided or right-sided hydronephrosis. No nephrolithiasis. Urinary bladder relatively decompressed. Stomach/Bowel: Unremarkable stomach. Small bowel decompressed. No transition point normal appendix. Colon decompressed. No inflammatory changes. Mild left-sided diverticular change. Vascular/Lymphatic: Atherosclerotic changes of the abdominal aorta. No aneurysm. No adenopathy. Reproductive: Transverse diameter of the prostate measures 4.8 cm. Other: Redemonstration of metallic shrapnel retained within the left iliac bone and left gluteal musculature. Musculoskeletal: No displaced fracture. Multilevel degenerative changes of the spine. Advanced degenerative changes of L2-L3, as well as L4-L5. Degenerative changes of the hips. IMPRESSION: No acute  finding of the abdomen to account  for abdominal pain. Small right-sided pleural effusion Aortic Atherosclerosis (ICD10-I70.0). Additional ancillary findings as above. Electronically Signed   By: Corrie Mckusick D.O.   On: 06/15/2021 12:02  ? ?DG Chest Port 1 View ? ?Result Date: 06/15/2021 ?CLINICAL DATA:  66 year old male with shortness of breath. EXAM: PORTABLE CHEST - 1 VIEW COMPARISON:  04/10/2021 FINDINGS: Unchanged cardiomegaly. Mild cephalization of pulmonary vasculature. Streaky bibasilar pulmonary opacities. No evidence of significant pleural effusion or pneumothorax. No acute osseous abnormality. IMPRESSION: Mild pulmonary edema, likely cardiogenic given cardiomegaly. Electronically Signed   By: Ruthann Cancer M.D.   On: 06/15/2021 10:10   ? ?Labs: ?BMET ?Recent Labs  ?Lab 06/15/21 ?0940 06/15/21 ?1020 06/16/21 ?0346  ?NA 137 134* 134*  ?K 5.9* 6.4* 4.5  ?CL 94*  --  94*  ?CO2 24  --  27  ?GLUCOSE 125*  --  88  ?BUN 74*  --  32*  ?CREATININE 15.38*  --  9.32*  ?CALCIUM 9.5  --  9.0  ? ?CBC ?Recent Labs  ?Lab 06/15/21 ?7680 06/15/21 ?1020 06/15/21 ?2201 06/16/21 ?0346  ?WBC 5.9  --   --  4.3  ?NEUTROABS 4.5  --   --   --   ?HGB 10.3* 11.2* 10.5* 10.0*  ?HCT 32.2* 33.0* 31.6* 30.3*  ?MCV 103.2*  --   --  101.0*  ?PLT 147*  --   --  143*  ? ? ?Medications:   ? ? Chlorhexidine Gluconate Cloth  6 each Topical Q0600  ? gabapentin  300 mg Oral QHS  ? pantoprazole (PROTONIX) IV  40 mg Intravenous Q12H  ? sucroferric oxyhydroxide  1,500 mg Oral TID WC  ? ? ? ? ?Gean Quint, MD ?[facility] ?06/16/2021, 12:18 PM  ? ?

## 2021-06-16 NOTE — Hospital Course (Signed)
Patient is a 66 year old male with ESRD on HD TTS, hypertension, chronic neuropathy, GERD presented with new onset hematemesis.  Patient has repeated hospitalization for recurrent nausea and vomiting for last 2 years.  In the past work-up etiology either viral gastroenteritis versus marijuana.  Presented with frequent episodes of nausea and vomiting which subsequently became bloody, freshly red blood.  No chest pain, no lightheadedness or shortness of breath.  He had mild epigastric discomfort. ? ?Patient missed his HD due to feeling nauseous on 3/23. ?Creatinine 15, potassium 6.4.  Patient was admitted. ?

## 2021-06-16 NOTE — Op Note (Signed)
[facility] ?Patient Name: Jonathan Mcneil ?Procedure Date : 06/16/2021 ?MRN: [id number] ?Attending MD: Lear Ng , MD ?Date of Birth: Feb 23, 1956 ?CSN: [id number] ?Age: 66 ?Admit Type: Inpatient ?Procedure:                Upper GI endoscopy ?Indications:              Suspected upper gastrointestinal bleeding,  ?                          Hematemesis ?Providers:                Lear Ng, MD, Jeanella Cara, RN,  ?                          Benetta Spar, Technician ?Referring MD:             hospital team ?Medicines:                Propofol per Anesthesia, Monitored Anesthesia Care ?Complications:            No immediate complications. ?Estimated Blood Loss:     Estimated blood loss: none. ?Procedure:                Pre-Anesthesia Assessment: ?                          - Prior to the procedure, a History and Physical  ?                          was performed, and patient medications and  ?                          allergies were reviewed. The patient's tolerance of  ?                          previous anesthesia was also reviewed. The risks  ?                          and benefits of the procedure and the sedation  ?                          options and risks were discussed with the patient.  ?                          All questions were answered, and informed consent  ?                          was obtained. Prior Anticoagulants: The patient has  ?                          taken no previous anticoagulant or antiplatelet  ?                          agents. ASA Grade Assessment: III - A patient with  ?  severe systemic disease. After reviewing the risks  ?                          and benefits, the patient was deemed in  ?                          satisfactory condition to undergo the procedure. ?                          After obtaining informed consent, the endoscope was  ?                          passed under direct vision. Throughout the  ?                           procedure, the patient's blood pressure, pulse, and  ?                          oxygen saturations were monitored continuously. The  ?                          [id number]-[id number] ([id number]) Olympus endoscope was introduced  ?                          through the mouth, and advanced to the second part  ?                          of duodenum. The upper GI endoscopy was  ?                          accomplished without difficulty. The patient  ?                          tolerated the procedure well. ?Scope In: ?Scope Out: ?Findings: ?     The examined esophagus was normal. ?     Patchy minimal inflammation characterized by congestion (edema) and  ?     erosions was found in the gastric antrum. ?     Patchy mild mucosal changes characterized by congestion, erythema and  ?     erosion were found in the duodenal bulb and in the second portion of the  ?     duodenum. ?Impression:               - Normal esophagus. ?                          - Gastritis. ?                          - Mucosal changes in the duodenum. ?                          - No specimens collected. ?Recommendation:           - Resume previous diet. ?                          -  Observe patient's clinical course. ?Procedure Code(s):        --- Professional --- ?                          [phone], Esophagogastroduodenoscopy, flexible,  ?                          transoral; diagnostic, including collection of  ?                          specimen(s) by brushing or washing, when performed  ?                          (separate procedure) ?Diagnosis Code(s):        --- Professional --- ?                          K92.0, Hematemesis ?                          K29.70, Gastritis, unspecified, without bleeding ?                          K31.89, Other diseases of stomach and duodenum ?CPT copyright 2019 [employer]. All rights reserved. ?The codes documented in this report are preliminary and upon coder review may  ?be revised to meet current compliance  requirements. ?Lear Ng, MD ?06/16/2021 10:51:22 AM ?This report has been signed electronically. ?Number of Addenda: 0 ?

## 2021-06-16 NOTE — Progress Notes (Addendum)
Pt off unit to hemo ?

## 2021-06-16 NOTE — Assessment & Plan Note (Addendum)
-   On hemodialysis, TTS.  Nephrology consulted, underwent dialysis per his schedule ?

## 2021-06-16 NOTE — Assessment & Plan Note (Addendum)
-   As above #GI bleed, resolved.  Hemoglobin stable. ?

## 2021-06-17 DIAGNOSIS — N184 Chronic kidney disease, stage 4 (severe): Secondary | ICD-10-CM

## 2021-06-17 DIAGNOSIS — E1122 Type 2 diabetes mellitus with diabetic chronic kidney disease: Secondary | ICD-10-CM

## 2021-06-17 LAB — BASIC METABOLIC PANEL
Anion gap: 12 (ref 5–15)
BUN: 19 mg/dL (ref 8–23)
CO2: 28 mmol/L (ref 22–32)
Calcium: 9.1 mg/dL (ref 8.9–10.3)
Chloride: 95 mmol/L — ABNORMAL LOW (ref 98–111)
Creatinine, Ser: 6.65 mg/dL — ABNORMAL HIGH (ref 0.61–1.24)
GFR, Estimated: 9 mL/min — ABNORMAL LOW (ref >60)
Glucose, Bld: 123 mg/dL — ABNORMAL HIGH (ref 70–99)
Potassium: 4.1 mmol/L (ref 3.5–5.1)
Sodium: 135 mmol/L (ref 135–145)

## 2021-06-17 LAB — IRON AND TIBC
Iron: 97 ug/dL (ref 45–182)
Saturation Ratios: 29 % (ref 17.9–39.5)
TIBC: 340 ug/dL (ref 250–450)
UIBC: 243 ug/dL

## 2021-06-17 LAB — CBC
HCT: 33.8 % — ABNORMAL LOW (ref 39.0–52.0)
Hemoglobin: 11.5 g/dL — ABNORMAL LOW (ref 13.0–17.0)
MCH: 33.5 pg (ref 26.0–34.0)
MCHC: 34 g/dL (ref 30.0–36.0)
MCV: 98.5 fL (ref 80.0–100.0)
Platelets: 168 10*3/uL (ref 150–400)
RBC: 3.43 MIL/uL — ABNORMAL LOW (ref 4.22–5.81)
RDW: 14.2 % (ref 11.5–15.5)
WBC: 4.6 10*3/uL (ref 4.0–10.5)
nRBC: 0 % (ref 0.0–0.2)

## 2021-06-17 LAB — FERRITIN: Ferritin: 693 ng/mL — ABNORMAL HIGH (ref 24–336)

## 2021-06-17 LAB — HEPATITIS B SURFACE ANTIBODY, QUANTITATIVE: Hep B S AB Quant (Post): >1000 m[IU]/mL (ref >9.9)

## 2021-06-17 MED ORDER — PANTOPRAZOLE SODIUM 40 MG PO TBEC: 40.0000 mg | DELAYED_RELEASE_TABLET | Freq: Two times a day (BID) | ORAL | 3 refills | Status: DC

## 2021-06-17 MED ORDER — ONDANSETRON HCL 4 MG PO TABS: 4.0000 mg | ORAL_TABLET | Freq: Three times a day (TID) | ORAL | 0 refills | Status: AC | PRN

## 2021-06-17 NOTE — Progress Notes (Signed)
DISCHARGE NOTE HOME ?Jonathan Mcneil to be discharged Home per MD order. Discussed prescriptions and follow up appointments with the patient. Prescriptions given to patient; medication list explained in detail. Patient verbalized understanding. ? ?Skin clean, dry and intact without evidence of skin break down, no evidence of skin tears noted. IV catheter discontinued intact. Site without signs and symptoms of complications. Dressing and pressure applied. Pt denies pain at the site currently. No complaints noted. ? ?Patient free of lines, drains, and wounds.  ? ?An After Visit Summary (AVS) was printed and given to the patient. ?Patient escorted via wheelchair, and discharged home via private auto. ? ?Yamira Papa S Cyree Chuong, RN   ?

## 2021-06-17 NOTE — Progress Notes (Signed)
?[facility] ?Progress Note  ? ? ?Assessment/ Plan:   ?# ESRD:  ?-outpatient HD orders: [facility] TTS. 4.5hrs, f180, BFR 550/autoflow 1.5, edw 94.5kg, 2k, 2.5ca, UF profile 4. Meds: calcitriol 63mcg qtreatment ?-Maintaining HD on TTS schedule ?  ?# N/V/D, hematemesis ?-per primary service and GI. S/p egd 3/25 with gastritis otherwise normal esophagus ?  ?# Hyperkalemia ?-resolved w/ HD ?  ?# Volume/ hypertension: EDW 94.5kg. UF as tolerated ?  ?# Anemia of Chronic Kidney Disease: Hemoglobin up to 11.5 today. Not on ESAs as an outpatient, will monitor in the interim. Iron profile ordered and he is not iron deficient ?  ?# Secondary Hyperparathyroidism/Hyperphosphatemia: c/w velphoro, will resume calcitriol  ?  ?# Vascular access: LUE AVF +b/t ?  ?# Additional recommendations: ?- Dose all meds for creatinine clearance < 10 ml/min  ?- Unless absolutely necessary, no MRIs with gadolinium.  ?- Implement save arm precautions.  Prefer needle sticks in the dorsum of the hands or wrists.  No blood pressure measurements in arm. ?- If blood transfusion is requested during hemodialysis sessions, please alert [place] prior to the session.  ?- If a hemodialysis catheter line culture is requested, please alert [place] as only hemodialysis nurses are able to collect those specimens.  ? ?Gean Quint, MD ?[facility] ? ?Subjective:   ?Patient seen and examined bedside this AM. No acute events. Tolerated hd yesterday w/ net uf 2160. He reports that his abdomen feels much better, has not had any vomiting/hematemesis. Possible d/c today?  ? ?Objective:   ?BP 124/73 (BP Location: Right Arm)   Pulse 79   Temp 98.4 ?F (36.9 ?C)   Resp 17   Ht 5\' 9"  (1.753 m)   Wt 86 kg   SpO2 100%   BMI 28.00 kg/m?  ? ?Intake/Output Summary (Last 24 hours) at 06/17/2021 1055 ?Last data filed at 06/17/2021 0908 ?Gross per 24 hour  ?Intake 1235 ml  ?Output 2160 ml  ?Net -925 ml  ? ?Weight change: -4.455 kg ? ?Physical Exam: ?Gen:nad,  laying flat in bed ?CVS:rrr ?Resp:normal wob ?WEX:HBZJ, NT/ND ?Ext: no sig edema b/l LEs ?Neuro: awake, alert, nonfocal ?Dialysis access: LUE AVF +b/t ? ?Imaging: ?CT Abdomen Pelvis Wo Contrast ? ?Result Date: 06/15/2021 ?CLINICAL DATA:  66 year old male with abdominal pain EXAM: CT ABDOMEN AND PELVIS WITHOUT CONTRAST TECHNIQUE: Multidetector CT imaging of the abdomen and pelvis was performed following the standard protocol without IV contrast. RADIATION DOSE REDUCTION: This exam was performed according to the departmental dose-optimization program which includes automated exposure control, adjustment of the mA and/or kV according to patient size and/or use of iterative reconstruction technique. COMPARISON:  03/19/2021 FINDINGS: Lower chest: Small right-sided pleural effusion with associated atelectasis. Redemonstration of geographic ground-glass attenuation in the dependent lower lungs, which was present on more remote chest CT, and again favored to represent airway trapping/small airway disease. No endobronchial debris or bronchial wall thickening. No interlobular septal thickening. Hepatobiliary: Unremarkable liver.  Unremarkable gallbladder. Pancreas: Unremarkable Spleen: Unremarkable Adrenals/Urinary Tract: Unremarkable bilateral adrenal glands. Similar appearance of renal cortical thinning. No evidence of left-sided or right-sided hydronephrosis. No nephrolithiasis. Urinary bladder relatively decompressed. Stomach/Bowel: Unremarkable stomach. Small bowel decompressed. No transition point normal appendix. Colon decompressed. No inflammatory changes. Mild left-sided diverticular change. Vascular/Lymphatic: Atherosclerotic changes of the abdominal aorta. No aneurysm. No adenopathy. Reproductive: Transverse diameter of the prostate measures 4.8 cm. Other: Redemonstration of metallic shrapnel retained within the left iliac bone and left gluteal musculature. Musculoskeletal: No displaced fracture. Multilevel  degenerative changes of the spine. Advanced degenerative changes of L2-L3, as well as L4-L5. Degenerative changes of the hips. IMPRESSION: No acute finding of the abdomen to account for abdominal pain. Small right-sided pleural effusion Aortic Atherosclerosis (ICD10-I70.0). Additional ancillary findings as above. Electronically Signed   By: Corrie Mckusick D.O.   On: 06/15/2021 12:02   ? ?Labs: ?BMET ?Recent Labs  ?Lab 06/15/21 ?1470 06/15/21 ?1020 06/16/21 ?0346 06/17/21 ?0432  ?NA 137 134* 134* 135  ?K 5.9* 6.4* 4.5 4.1  ?CL 94*  --  94* 95*  ?CO2 24  --  27 28  ?GLUCOSE 125*  --  88 123*  ?BUN 74*  --  32* 19  ?CREATININE 15.38*  --  9.32* 6.65*  ?CALCIUM 9.5  --  9.0 9.1  ? ?CBC ?Recent Labs  ?Lab 06/15/21 ?9295 06/15/21 ?1020 06/15/21 ?2201 06/16/21 ?0346 06/17/21 ?0432  ?WBC 5.9  --   --  4.3 4.6  ?NEUTROABS 4.5  --   --   --   --   ?HGB 10.3* 11.2* 10.5* 10.0* 11.5*  ?HCT 32.2* 33.0* 31.6* 30.3* 33.8*  ?MCV 103.2*  --   --  101.0* 98.5  ?PLT 147*  --   --  143* 168  ? ? ?Medications:   ? ? calcitRIOL  2 mcg Oral Q T,Th,Sa-HD  ? Chlorhexidine Gluconate Cloth  6 each Topical Q0600  ? gabapentin  300 mg Oral QHS  ? pantoprazole (PROTONIX) IV  40 mg Intravenous Q12H  ? sucroferric oxyhydroxide  1,500 mg Oral TID WC  ? ? ? ? ?Gean Quint, MD ?[facility] ?06/17/2021, 10:55 AM  ? ?

## 2021-06-17 NOTE — Discharge Summary (Signed)
?Physician Discharge Summary ?  ?Patient: Jonathan Mcneil MRN: [id number] DOB: 11/22/1955  ?Admit date:     06/15/2021  ?Discharge date: 06/17/21  ?Discharge Physician: Estill Cotta, MD  ? ?PCP: Merrilee Seashore, MD  ? ?Recommendations at discharge:  ? ?Continue Protonix 40 mg p.o. twice daily for 4 weeks and then daily ? ?Discharge Diagnoses: ? ?Upper GI bleed ?  ESRD (end stage renal disease) ([place]) on hemodialysis TTS ?  Hematemesis ?  Hyperkalemia ?  Type 2 diabetes mellitus with stage 4 chronic kidney disease ([place]) ? ? ?Hospital Course: ?Patient is a 66 year old male with ESRD on HD TTS, hypertension, chronic neuropathy, GERD presented with new onset hematemesis.  Patient has repeated hospitalization for recurrent nausea and vomiting for last 2 years.  In the past work-up etiology either viral gastroenteritis versus marijuana.  Presented with frequent episodes of nausea and vomiting which subsequently became bloody, freshly red blood.  No chest pain, no lightheadedness or shortness of breath.  He had mild epigastric discomfort. ? ?Patient missed his HD due to feeling nauseous on 3/23. ?Creatinine 15, potassium 6.4.  Patient was admitted. ? ?Assessment and Plan: ?* GI bleed ?- Presented with hematemesis, upper GI bleed ?-GI consulted, underwent EGD with minimal gastritis and mild duodenitis.  Per GI superficial mucosal irritation causing blood in the vomitus, has not reoccurred. ?-Continue p.o. twice daily PPI and outpatient follow-up with GI ?-No repeat hematemesis, nausea or vomiting, tolerating diet ?-Hemoglobin 11.5 at discharge ? ?Hematemesis ?- As above #GI bleed, resolved.  Hemoglobin stable. ? ?ESRD (end stage renal disease) ([place] [place]) ?- On hemodialysis, TTS.  Nephrology consulted, underwent dialysis per his schedule ? ?Hyperkalemia ?- Presented with potassium of 6.4, improved to 4.5 after HD, on 3/24, received HD on 3/25 again ? ?Type 2 diabetes mellitus with stage 4 chronic kidney disease ([place]) ?-  HbA1c 5.3 in 02/2021, well controlled ? ? ? ? ?  ? ?Pain control - [employer] Controlled Substance Reporting System database was reviewed. and patient was instructed, not to drive, operate heavy machinery, perform activities at heights, swimming or participation in water activities or provide baby-sitting services while on Pain, Sleep and Anxiety Medications; until their outpatient Physician has advised to do so again. Also recommended to not to take more than prescribed Pain, Sleep and Anxiety Medications.  ?Consultants: Nephrology, GI ?Procedures performed: Endoscopy ?Disposition: Home ?Diet recommendation:  ?Discharge Diet Orders (From admission, onward)  ? ?  Start     Ordered  ? 06/17/21 0000  Diet - low sodium heart healthy       ? 06/17/21 1007  ? ?  ?  ? ?  ? ?Cardiac diet ?DISCHARGE MEDICATION: ?Allergies as of 06/17/2021   ? ?   Reactions  ? Gabapentin Nausea Only  ? Ibuprofen Hives  ? Penicillins Itching, Other (See Comments)  ? Has patient had a PCN reaction causing immediate rash, facial/tongue/throat swelling, SOB or lightheadedness with hypotension: No ?Has patient had a PCN reaction causing severe rash involving mucus membranes or skin necrosis: No ?Has patient had a PCN reaction that required hospitalization No ?Has patient had a PCN reaction occurring within the last 10 years: Unknown ?If all of the above answers are "NO", then may proceed with Cephalosporin use.  ? Venlafaxine Other (See Comments)  ? insomnia  ? ?  ? ?  ?Medication List  ?  ? ?TAKE these medications   ? ?acetaminophen 650 MG CR tablet ?Commonly known as: TYLENOL ?Take 1,300 mg by mouth  daily as needed for pain. ?  ?gabapentin 300 MG capsule ?Commonly known as: NEURONTIN ?Take 300 mg by mouth at bedtime. ?  ?loperamide 2 MG tablet ?Commonly known as: Imodium A-D ?Take 1 tablet (2 mg total) by mouth 4 (four) times daily as needed for diarrhea or loose stools. ?  ?ondansetron 4 MG tablet ?Commonly known as: Zofran ?Take 1 tablet  (4 mg total) by mouth every 8 (eight) hours as needed for nausea or vomiting. ?  ?pantoprazole 40 MG tablet ?Commonly known as: Protonix ?Take 1 tablet (40 mg total) by mouth 2 (two) times daily before a meal. Twice a day x 4 weeks, then daily ?  ?sertraline 25 MG tablet ?Commonly known as: ZOLOFT ?Take 25 mg by mouth daily as needed (depression). ?  ?Velphoro 500 MG chewable tablet ?Generic drug: sucroferric oxyhydroxide ?Chew 1,500 mg by mouth 3 (three) times daily with meals. ?  ? ?  ? ? Follow-up Information   ? ? Wilford Corner, MD Follow up in 4 week(s).   ?Specialty: Gastroenterology ?Why: for hospital follow-up ?Contact information: ?[address] ?[place] [address] ?[phone] ? ? ?  ?  ? ? Merrilee Seashore, MD Follow up in 2 week(s).   ?Specialty: Internal Medicine ?Why: for hospital follow-up ?Contact information: ?[place] [address] ?[phone] ? ? ?  ?  ? ?  ?  ? ?  ? ?Discharge Exam: ?Filed Weights  ? 06/16/21 0827 06/16/21 1428 06/16/21 1842  ?Weight: 90.8 kg 88.1 kg 86 kg  ? ?S: Doing well, tolerating diet, no nausea vomiting or any hematemesis.  Hemoglobin stable, ready to go home. ? ?Vitals:  ? 06/16/21 1842 06/16/21 2107 06/17/21 0534 06/17/21 0902  ?BP: 110/69 (!) 113/94 116/79 124/73  ?Pulse: 72 80 71 79  ?Resp: 17 18 18 17   ?Temp: (!) 97.2 ?F (36.2 ?C) 98.8 ?F (37.1 ?C) 98 ?F (36.7 ?C) 98.4 ?F (36.9 ?C)  ?TempSrc: Temporal     ?SpO2: 99% 99% 98% 100%  ?Weight: 86 kg     ?Height:      ?  ? ?Physical Exam ?General: Alert and oriented x 3, NAD ?Cardiovascular: S1 S2 clear, RRR. No pedal edema b/l ?Respiratory: CTAB, no wheezing, rales or rhonchi ?Gastrointestinal: Soft, nontender, nondistended, NBS ?Ext: no pedal edema bilaterally ?Neuro: no new deficits ?Psych: Normal affect and demeanor, alert and oriented x3  ? ? ?Condition at discharge: good ? ?The results of significant diagnostics from this hospitalization (including imaging,  microbiology, ancillary and laboratory) are listed below for reference.  ? ?Imaging Studies: ?CT Abdomen Pelvis Wo Contrast ? ?Result Date: 06/15/2021 ?CLINICAL DATA:  66 year old male with abdominal pain EXAM: CT ABDOMEN AND PELVIS WITHOUT CONTRAST TECHNIQUE: Multidetector CT imaging of the abdomen and pelvis was performed following the standard protocol without IV contrast. RADIATION DOSE REDUCTION: This exam was performed according to the departmental dose-optimization program which includes automated exposure control, adjustment of the mA and/or kV according to patient size and/or use of iterative reconstruction technique. COMPARISON:  03/19/2021 FINDINGS: Lower chest: Small right-sided pleural effusion with associated atelectasis. Redemonstration of geographic ground-glass attenuation in the dependent lower lungs, which was present on more remote chest CT, and again favored to represent airway trapping/small airway disease. No endobronchial debris or bronchial wall thickening. No interlobular septal thickening. Hepatobiliary: Unremarkable liver.  Unremarkable gallbladder. Pancreas: Unremarkable Spleen: Unremarkable Adrenals/Urinary Tract: Unremarkable bilateral adrenal glands. Similar appearance of renal cortical thinning. No evidence of left-sided or  right-sided hydronephrosis. No nephrolithiasis. Urinary bladder relatively decompressed. Stomach/Bowel: Unremarkable stomach. Small bowel decompressed. No transition point normal appendix. Colon decompressed. No inflammatory changes. Mild left-sided diverticular change. Vascular/Lymphatic: Atherosclerotic changes of the abdominal aorta. No aneurysm. No adenopathy. Reproductive: Transverse diameter of the prostate measures 4.8 cm. Other: Redemonstration of metallic shrapnel retained within the left iliac bone and left gluteal musculature. Musculoskeletal: No displaced fracture. Multilevel degenerative changes of the spine. Advanced degenerative changes of L2-L3, as  well as L4-L5. Degenerative changes of the hips. IMPRESSION: No acute finding of the abdomen to account for abdominal pain. Small right-sided pleural effusion Aortic Atherosclerosis (ICD10-I70.0). Additional a

## 2021-06-18 ENCOUNTER — Encounter (HOSPITAL_COMMUNITY): Payer: Self-pay | Admitting: Gastroenterology

## 2021-06-18 ENCOUNTER — Ambulatory Visit (INDEPENDENT_AMBULATORY_CARE_PROVIDER_SITE_OTHER): Payer: Medicare Other

## 2021-06-18 ENCOUNTER — Ambulatory Visit (INDEPENDENT_AMBULATORY_CARE_PROVIDER_SITE_OTHER): Payer: Medicare Other | Admitting: Podiatry

## 2021-06-18 ENCOUNTER — Other Ambulatory Visit: Payer: Self-pay

## 2021-06-18 DIAGNOSIS — I739 Peripheral vascular disease, unspecified: Secondary | ICD-10-CM | POA: Diagnosis not present

## 2021-06-18 DIAGNOSIS — M2021 Hallux rigidus, right foot: Secondary | ICD-10-CM

## 2021-06-18 DIAGNOSIS — M79671 Pain in right foot: Secondary | ICD-10-CM

## 2021-06-18 DIAGNOSIS — M79675 Pain in left toe(s): Secondary | ICD-10-CM | POA: Diagnosis not present

## 2021-06-18 DIAGNOSIS — M79674 Pain in right toe(s): Secondary | ICD-10-CM | POA: Diagnosis not present

## 2021-06-18 DIAGNOSIS — B351 Tinea unguium: Secondary | ICD-10-CM | POA: Diagnosis not present

## 2021-06-19 DIAGNOSIS — Z992 Dependence on renal dialysis: Secondary | ICD-10-CM | POA: Diagnosis not present

## 2021-06-19 DIAGNOSIS — N2581 Secondary hyperparathyroidism of renal origin: Secondary | ICD-10-CM | POA: Diagnosis not present

## 2021-06-19 DIAGNOSIS — N186 End stage renal disease: Secondary | ICD-10-CM | POA: Diagnosis not present

## 2021-06-19 NOTE — Progress Notes (Signed)
Subjective:  ? ?Patient ID: Jonathan Mcneil, male   DOB: 66 y.o.   MRN: [id number]  ? ?HPI ?66 year old male presents the office today requesting for his nails be trimmed there is a thickened elongated he is not able to trim himself.  Also gets a painful spot on his right foot on the big toe there is a knot that he wants taken care of.  No recent injuries.  He has no other concerns today. ? ?Review of Systems  ?All other systems reviewed and are negative. ? ?Past Medical History:  ?Diagnosis Date  ? Cataract   ? Diabetes mellitus without complication ([facility])   ? Diabetic retinopathy ([facility])   ? ESRD (end stage renal disease) ([place])   ? GERD (gastroesophageal reflux disease)   ? PMH  ? Hypertension   ? Hypertensive retinopathy   ? Low back pain   ? Metabolic acidosis   ? Neuromuscular disorder ([facility])   ? peripheral neuropathy  ? Pancreatitis   ? Schizophrenia ([place])   ? does not take medications  ? ? ?Past Surgical History:  ?Procedure Laterality Date  ? A/V FISTULAGRAM Left 06/03/2018  ? Procedure: A/V FISTULAGRAM;  Surgeon: Marty Heck, MD;  Location: [facility];  Service: Cardiovascular;  Laterality: Left;  ? AV FISTULA PLACEMENT Left 02/11/2018  ? Procedure: INSERTION OF ARTERIOVENOUS (AV) FISTULA LEFT  ARM;  Surgeon: [name], MD;  Location: [facility];  Service: Vascular;  Laterality: Left;  ? BASCILIC VEIN TRANSPOSITION Left 04/17/2018  ? Procedure: BASILIC VEIN TRANSPOSITION SECOND STAGE;  Surgeon: [name], MD;  Location: [facility];  Service: Vascular;  Laterality: Left;  ? BIOPSY  10/20/2019  ? Procedure: BIOPSY;  Surgeon: Clarene Essex, MD;  Location: [facility];  Service: Endoscopy;;  ? COLONOSCOPY    ? DENTAL SURGERY    ? ESOPHAGOGASTRODUODENOSCOPY N/A 06/16/2021  ? Procedure: ESOPHAGOGASTRODUODENOSCOPY (EGD);  Surgeon: Wilford Corner, MD;  Location: [facility];  Service: Gastroenterology;  Laterality: N/A;  ? ESOPHAGOGASTRODUODENOSCOPY (EGD) WITH PROPOFOL N/A  10/20/2019  ? Procedure: ESOPHAGOGASTRODUODENOSCOPY (EGD) WITH PROPOFOL;  Surgeon: Clarene Essex, MD;  Location: [facility];  Service: Endoscopy;  Laterality: N/A;  ? INSERTION OF DIALYSIS CATHETER Right 02/25/2018  ? Procedure: INSERTION OF DIALYSIS CATHETER;  Surgeon: [name], MD;  Location: [facility];  Service: Vascular;  Laterality: Right;  ? PERIPHERAL VASCULAR BALLOON ANGIOPLASTY  06/03/2018  ? Procedure: PERIPHERAL VASCULAR BALLOON ANGIOPLASTY;  Surgeon: Marty Heck, MD;  Location: [facility];  Service: Cardiovascular;;  left a/v fistula  ? ? ? ?Current Outpatient Medications:  ?  acetaminophen (TYLENOL) 650 MG CR tablet, Take 1,300 mg by mouth daily as needed for pain., Disp: , Rfl:  ?  gabapentin (NEURONTIN) 300 MG capsule, Take 300 mg by mouth at bedtime., Disp: , Rfl:  ?  loperamide (IMODIUM A-D) 2 MG tablet, Take 1 tablet (2 mg total) by mouth 4 (four) times daily as needed for diarrhea or loose stools. (Patient not taking: Reported on 06/15/2021), Disp: 20 tablet, Rfl: 0 ?  ondansetron (ZOFRAN) 4 MG tablet, Take 1 tablet (4 mg total) by mouth every 8 (eight) hours as needed for nausea or vomiting., Disp: 20 tablet, Rfl: 0 ?  pantoprazole (PROTONIX) 40 MG tablet, Take 1 tablet (40 mg total) by mouth 2 (two) times daily before a meal. Twice a day x 4 weeks, then daily, Disp: 60 tablet, Rfl: 3 ?  sertraline (ZOLOFT) 25 MG tablet, Take 25 mg by  mouth daily as needed (depression)., Disp: , Rfl:  ?  sucroferric oxyhydroxide (VELPHORO) 500 MG chewable tablet, Chew 1,500 mg by mouth 3 (three) times daily with meals., Disp: , Rfl:  ? ?Allergies  ?Allergen Reactions  ? Gabapentin Nausea Only  ? Ibuprofen Hives  ? Penicillins Itching and Other (See Comments)  ?  Has patient had a PCN reaction causing immediate rash, facial/tongue/throat swelling, SOB or lightheadedness with hypotension: No ?Has patient had a PCN reaction causing severe rash involving mucus membranes or skin necrosis:  No ?Has patient had a PCN reaction that required hospitalization No ?Has patient had a PCN reaction occurring within the last 10 years: Unknown ?If all of the above answers are "NO", then may proceed with Cephalosporin use.  ? Venlafaxine Other (See Comments)  ?  insomnia  ? ? ? ? ?   ?Objective:  ?Physical Exam  ?General: AAO x3, NAD ? ?Dermatological: Nails are hypertrophic, dystrophic, brittle, discolored, elongated ?10. No surrounding redness or drainage. Tenderness nails 1-5 bilaterally. No open lesions or pre-ulcerative lesions are identified today. ? ?Vascular: Dorsalis Pedis artery and Posterior Tibial artery pedal pulses are 2/4 bilateral with immedate capillary fill time.  There is no pain with calf compression, swelling, warmth, erythema.  ? ?Neruologic: Sensation decreased with Thornell Mule monofilament ? ?Musculoskeletal: No gross boney pedal deformities bilateral. No pain, crepitus, or limitation noted with foot and ankle range of motion bilateral. Muscular strength 5/5 in all groups tested bilateral. ? ?Gait: Unassisted, Nonantalgic.  ? ? ?   ?Assessment:  ? ?Symptomatic onychomycosis, neuropathy, hallux rigidus right foot ? ?   ?Plan:  ?-Treatment options discussed including all alternatives, risks, and complications ?-Etiology of symptoms were discussed ?-X-rays were obtained and reviewed with the patient.  3 views of the right foot were obtained which are weightbearing.  Arthritic changes present the first MPJ.  No evidence of acute fracture.  Decreased calcaneal elevation ankle.  Vessel calcification is present. ?-Although vascular ossification evident on x-ray and has numbness of ordered arterial studies, ABI. ?-Discussed numbness to his feet likely neuropathy.  We will monitor this.  Discussed medications. ?-Sharply debrided the nails x10 without any complications or bleeding ?-Discussed that the bump on the side of the foot is from hallux rigidus, arthritis.  Dispensed offloading pads we  discussed shoe modifications. ? ?Trula Slade DPM ? ?   ? ?

## 2021-06-21 DIAGNOSIS — Z992 Dependence on renal dialysis: Secondary | ICD-10-CM | POA: Diagnosis not present

## 2021-06-21 DIAGNOSIS — N186 End stage renal disease: Secondary | ICD-10-CM | POA: Diagnosis not present

## 2021-06-21 DIAGNOSIS — N2581 Secondary hyperparathyroidism of renal origin: Secondary | ICD-10-CM | POA: Diagnosis not present

## 2021-06-22 ENCOUNTER — Ambulatory Visit (HOSPITAL_COMMUNITY): Payer: Medicare Other | Attending: Podiatry

## 2021-06-22 DIAGNOSIS — Z992 Dependence on renal dialysis: Secondary | ICD-10-CM | POA: Diagnosis not present

## 2021-06-22 DIAGNOSIS — N186 End stage renal disease: Secondary | ICD-10-CM | POA: Diagnosis not present

## 2021-06-22 DIAGNOSIS — E1122 Type 2 diabetes mellitus with diabetic chronic kidney disease: Secondary | ICD-10-CM | POA: Diagnosis not present

## 2021-06-23 ENCOUNTER — Other Ambulatory Visit: Payer: Self-pay | Admitting: Podiatry

## 2021-06-23 DIAGNOSIS — M2021 Hallux rigidus, right foot: Secondary | ICD-10-CM

## 2021-06-23 DIAGNOSIS — R52 Pain, unspecified: Secondary | ICD-10-CM | POA: Diagnosis not present

## 2021-06-23 DIAGNOSIS — D631 Anemia in chronic kidney disease: Secondary | ICD-10-CM | POA: Diagnosis not present

## 2021-06-23 DIAGNOSIS — E1129 Type 2 diabetes mellitus with other diabetic kidney complication: Secondary | ICD-10-CM | POA: Diagnosis not present

## 2021-06-23 DIAGNOSIS — Z992 Dependence on renal dialysis: Secondary | ICD-10-CM | POA: Diagnosis not present

## 2021-06-23 DIAGNOSIS — N186 End stage renal disease: Secondary | ICD-10-CM | POA: Diagnosis not present

## 2021-06-23 DIAGNOSIS — L299 Pruritus, unspecified: Secondary | ICD-10-CM | POA: Diagnosis not present

## 2021-06-23 DIAGNOSIS — N2581 Secondary hyperparathyroidism of renal origin: Secondary | ICD-10-CM | POA: Diagnosis not present

## 2021-06-25 NOTE — Progress Notes (Shared)
?[facility] Note ? ?06/29/2021 ? ?  ? ?CHIEF COMPLAINT ?Patient presents for No chief complaint on file. ? ?HISTORY OF PRESENT ILLNESS: ?Jonathan Mcneil is a 66 y.o. male who presents to the clinic today for:  ? ? ?Pt states visions is doing well ? ?Referring physician: ?Merrilee Seashore, MD ?[place],  [place] [address] ? ?HISTORICAL INFORMATION:  ? ?Selected notes from the [facility] ?Referred by Dr. Maryjane Hurter for heme OD  ? ?CURRENT MEDICATIONS: ?No current outpatient medications on file. (Ophthalmic Drugs)  ? ?No current facility-administered medications for this visit. (Ophthalmic Drugs)  ? ?Current Outpatient Medications (Other)  ?Medication Sig  ? acetaminophen (TYLENOL) 650 MG CR tablet Take 1,300 mg by mouth daily as needed for pain.  ? gabapentin (NEURONTIN) 300 MG capsule Take 300 mg by mouth at bedtime.  ? loperamide (IMODIUM A-D) 2 MG tablet Take 1 tablet (2 mg total) by mouth 4 (four) times daily as needed for diarrhea or loose stools. (Patient not taking: Reported on 06/15/2021)  ? ondansetron (ZOFRAN) 4 MG tablet Take 1 tablet (4 mg total) by mouth every 8 (eight) hours as needed for nausea or vomiting.  ? pantoprazole (PROTONIX) 40 MG tablet Take 1 tablet (40 mg total) by mouth 2 (two) times daily before a meal. Twice a day x 4 weeks, then daily  ? sertraline (ZOLOFT) 25 MG tablet Take 25 mg by mouth daily as needed (depression).  ? sucroferric oxyhydroxide (VELPHORO) 500 MG chewable tablet Chew 1,500 mg by mouth 3 (three) times daily with meals.  ? ?No current facility-administered medications for this visit. (Other)  ? ?REVIEW OF SYSTEMS: ? ? ?ALLERGIES ?Allergies  ?Allergen Reactions  ? Gabapentin Nausea Only  ? Ibuprofen Hives  ? Penicillins Itching and Other (See Comments)  ?  Has patient had a PCN reaction causing immediate rash, facial/tongue/throat swelling, SOB or lightheadedness with hypotension: No ?Has patient had a PCN  reaction causing severe rash involving mucus membranes or skin necrosis: No ?Has patient had a PCN reaction that required hospitalization No ?Has patient had a PCN reaction occurring within the last 10 years: Unknown ?If all of the above answers are "NO", then may proceed with Cephalosporin use.  ? Venlafaxine Other (See Comments)  ?  insomnia  ? ?PAST MEDICAL HISTORY ?Past Medical History:  ?Diagnosis Date  ? Cataract   ? Diabetes mellitus without complication ([place])   ? Diabetic retinopathy ([facility])   ? ESRD (end stage renal disease) ([facility])   ? GERD (gastroesophageal reflux disease)   ? PMH  ? Hypertension   ? Hypertensive retinopathy   ? Low back pain   ? Metabolic acidosis   ? Neuromuscular disorder ([facility])   ? peripheral neuropathy  ? Pancreatitis   ? Schizophrenia ([place])   ? does not take medications  ? ?Past Surgical History:  ?Procedure Laterality Date  ? A/V FISTULAGRAM Left 06/03/2018  ? Procedure: A/V FISTULAGRAM;  Surgeon: Marty Heck, MD;  Location: [facility];  Service: Cardiovascular;  Laterality: Left;  ? AV FISTULA PLACEMENT Left 02/11/2018  ? Procedure: INSERTION OF ARTERIOVENOUS (AV) FISTULA LEFT  ARM;  Surgeon: [name], MD;  Location: [facility];  Service: Vascular;  Laterality: Left;  ? BASCILIC VEIN TRANSPOSITION Left 04/17/2018  ? Procedure: BASILIC VEIN TRANSPOSITION SECOND STAGE;  Surgeon: [name], MD;  Location: [facility];  Service: Vascular;  Laterality: Left;  ? BIOPSY  10/20/2019  ? Procedure: BIOPSY;  Surgeon: Clarene Essex, MD;  Location: [facility];  Service: Endoscopy;;  ? COLONOSCOPY    ? DENTAL SURGERY    ? ESOPHAGOGASTRODUODENOSCOPY N/A 06/16/2021  ? Procedure: ESOPHAGOGASTRODUODENOSCOPY (EGD);  Surgeon: Wilford Corner, MD;  Location: [facility];  Service: Gastroenterology;  Laterality: N/A;  ? ESOPHAGOGASTRODUODENOSCOPY (EGD) WITH PROPOFOL N/A 10/20/2019  ? Procedure: ESOPHAGOGASTRODUODENOSCOPY (EGD) WITH PROPOFOL;  Surgeon: Clarene Essex, MD;   Location: [facility];  Service: Endoscopy;  Laterality: N/A;  ? INSERTION OF DIALYSIS CATHETER Right 02/25/2018  ? Procedure: INSERTION OF DIALYSIS CATHETER;  Surgeon: [name], MD;  Location: [facility];  Service: Vascular;  Laterality: Right;  ? PERIPHERAL VASCULAR BALLOON ANGIOPLASTY  06/03/2018  ? Procedure: PERIPHERAL VASCULAR BALLOON ANGIOPLASTY;  Surgeon: Marty Heck, MD;  Location: [facility];  Service: Cardiovascular;;  left a/v fistula  ? ?FAMILY HISTORY ?Family History  ?Problem Relation Age of Onset  ? Kidney failure Mother   ? Diabetes Father   ? Blindness Brother   ? ?SOCIAL HISTORY ?Social History  ? ?Tobacco Use  ? Smoking status: Former  ? Smokeless tobacco: Never  ?Vaping Use  ? Vaping Use: Never used  ?Substance Use Topics  ? Alcohol use: No  ? Drug use: Not Currently  ?  Types: Cocaine, Marijuana  ?  Comment: smoked marijuana a few days ago; he denies using cocaine  ?  ? ?  ?OPHTHALMIC EXAM: ? ?Not recorded ?  ? ?IMAGING AND PROCEDURES  ?Imaging and Procedures for 06/29/2021 ? ? ?  ?  ? ?  ?ASSESSMENT/PLAN: ?  ICD-10-CM   ?1. Proliferative diabetic retinopathy of both eyes with macular edema associated with type 2 diabetes mellitus ([facility])  H41.7408   ?  ?2. Vitreous hemorrhage of left eye (HCC)  H43.12   ?  ?3. Vitreous hemorrhage, right eye (HCC)  H43.11   ?  ?4. Posterior vitreous detachment of left eye  H43.812   ?  ?5. Essential hypertension  I10   ?  ?6. Hypertensive retinopathy of both eyes  H35.033   ?  ?7. Combined forms of age-related cataract of both eyes  H25.813   ?  ? ? ?1-3. Proliferative diabetic retinopathy w/ recurrent VH OU (OD > OS) ?- s/p IVA OD #1 (12.08.21), #2 (01.11.22), #3 (02.09.22), #4 (11.04.22), #5 (12.02.22), #6 (12.30.22), #7 (01.27.23) ?- s/p IVA OS #1 (12.10.21), #2 (01.11.22), #3 (02.09.22), #4 (09.09.22), #5 (10.07.22), #6 (11.04.22), #7 (12.02.22), #8 (12.30.22) ?- FA (12.08.21) shows late-leaking MA, vascular nonperfusion, +NV OU  (nasal midzone) ?- s/p PRP OD (01.26.22) ?- s/p PRP OS (03.09.22) ?- BCVA 20/25 OD, 20/20 OS - stable OU ?- exam shows interval improvement in VH OD; OS with blood stained vit condensations improving from hemorrhagic PVD, and blood clots settled inferiorly ? - OCT shows OD: trace vitreous opacities; OS: stable improvement in vitreous opacities; persistent cystic changes temporal macula - improved, ERM with blunted foveal contour ? - repeat FA 6.6.22 shows interval improvement in NVE/leakage OU s/p PRP OU-- just minimal leakage remains  ? - recommend holding heparin at dialysis if able--gave note for patient to take to dialysis on 12/23/2020 ? - recommend hold injection OU today ? - pt in agreement ?- f/u 6 weeks, DFE, OCT, possible injections ? ?4. Hemorrhagic PVD OS ? - Onset mid-August 2022 w/ new onset floaters, no photopsias  ?- s/p IVA OS #4 (09.09.22), #5 (10.07.22), #6 (11.04.22), #7 (12.02.22), #8 (12.30.22) ?- today, VA stably  improved to 20/20 OS from 20/40 and 20/80 prior ?- pt states they have been holding heparin at dialysis ?- exam shows interval improvement in vitreous heme -- clearing and settling inferiorly ?- pt reports previously receiving heparin during dialysis (T, Th, Sat) -- may have contributed to interval worsening   ? - Discussed findings and prognosis ? - No RT or RD on 360 peripheral exam ? - Reviewed s/s of RT/RD ? - Strict return precautions for any such RT/RD signs/symptoms ? - VH precautions reviewed -- minimize activities, keep head elevated, hold heparin if able, avoid ASA/NSAIDS ? ?5,6. Hypertensive retinopathy OU ?- discussed importance of tight BP control ?- monitor  ? ?7. Mixed Cataract OU ?- The symptoms of cataract, surgical options, and treatments and risks were discussed with patient ?- discussed diagnosis and progression ?- approaching visual significance ?- monitor for now ? ?Ophthalmic Meds Ordered this visit:  ?No orders of the defined types were placed in this  encounter. ?  ? ?No follow-ups on file. ? ?There are no Patient Instructions on file for this visit. ? ?This document serves as a record of services personally performed by Gardiner Sleeper, MD, PhD. It was created on

## 2021-06-26 DIAGNOSIS — E1129 Type 2 diabetes mellitus with other diabetic kidney complication: Secondary | ICD-10-CM | POA: Diagnosis not present

## 2021-06-26 DIAGNOSIS — D631 Anemia in chronic kidney disease: Secondary | ICD-10-CM | POA: Diagnosis not present

## 2021-06-26 DIAGNOSIS — N2581 Secondary hyperparathyroidism of renal origin: Secondary | ICD-10-CM | POA: Diagnosis not present

## 2021-06-26 DIAGNOSIS — L299 Pruritus, unspecified: Secondary | ICD-10-CM | POA: Diagnosis not present

## 2021-06-26 DIAGNOSIS — N186 End stage renal disease: Secondary | ICD-10-CM | POA: Diagnosis not present

## 2021-06-26 DIAGNOSIS — R52 Pain, unspecified: Secondary | ICD-10-CM | POA: Diagnosis not present

## 2021-06-26 DIAGNOSIS — Z992 Dependence on renal dialysis: Secondary | ICD-10-CM | POA: Diagnosis not present

## 2021-06-27 DIAGNOSIS — I120 Hypertensive chronic kidney disease with stage 5 chronic kidney disease or end stage renal disease: Secondary | ICD-10-CM | POA: Diagnosis not present

## 2021-06-27 DIAGNOSIS — Z01818 Encounter for other preprocedural examination: Secondary | ICD-10-CM | POA: Diagnosis not present

## 2021-06-27 DIAGNOSIS — E1122 Type 2 diabetes mellitus with diabetic chronic kidney disease: Secondary | ICD-10-CM | POA: Diagnosis not present

## 2021-06-27 DIAGNOSIS — Z01810 Encounter for preprocedural cardiovascular examination: Secondary | ICD-10-CM | POA: Diagnosis not present

## 2021-06-27 DIAGNOSIS — I509 Heart failure, unspecified: Secondary | ICD-10-CM | POA: Diagnosis not present

## 2021-06-27 DIAGNOSIS — N186 End stage renal disease: Secondary | ICD-10-CM | POA: Diagnosis not present

## 2021-06-28 DIAGNOSIS — E782 Mixed hyperlipidemia: Secondary | ICD-10-CM | POA: Diagnosis not present

## 2021-06-28 DIAGNOSIS — N2581 Secondary hyperparathyroidism of renal origin: Secondary | ICD-10-CM | POA: Diagnosis not present

## 2021-06-28 DIAGNOSIS — Z09 Encounter for follow-up examination after completed treatment for conditions other than malignant neoplasm: Secondary | ICD-10-CM | POA: Diagnosis not present

## 2021-06-28 DIAGNOSIS — N186 End stage renal disease: Secondary | ICD-10-CM | POA: Diagnosis not present

## 2021-06-28 DIAGNOSIS — I5020 Unspecified systolic (congestive) heart failure: Secondary | ICD-10-CM | POA: Diagnosis not present

## 2021-06-28 DIAGNOSIS — L299 Pruritus, unspecified: Secondary | ICD-10-CM | POA: Diagnosis not present

## 2021-06-28 DIAGNOSIS — E1142 Type 2 diabetes mellitus with diabetic polyneuropathy: Secondary | ICD-10-CM | POA: Diagnosis not present

## 2021-06-28 DIAGNOSIS — R52 Pain, unspecified: Secondary | ICD-10-CM | POA: Diagnosis not present

## 2021-06-28 DIAGNOSIS — Z992 Dependence on renal dialysis: Secondary | ICD-10-CM | POA: Diagnosis not present

## 2021-06-28 DIAGNOSIS — I340 Nonrheumatic mitral (valve) insufficiency: Secondary | ICD-10-CM | POA: Diagnosis not present

## 2021-06-28 DIAGNOSIS — E1129 Type 2 diabetes mellitus with other diabetic kidney complication: Secondary | ICD-10-CM | POA: Diagnosis not present

## 2021-06-28 DIAGNOSIS — D631 Anemia in chronic kidney disease: Secondary | ICD-10-CM | POA: Diagnosis not present

## 2021-06-29 ENCOUNTER — Encounter (INDEPENDENT_AMBULATORY_CARE_PROVIDER_SITE_OTHER): Payer: Self-pay | Admitting: Ophthalmology

## 2021-06-29 ENCOUNTER — Encounter (INDEPENDENT_AMBULATORY_CARE_PROVIDER_SITE_OTHER): Payer: Medicare Other | Admitting: Ophthalmology

## 2021-06-29 ENCOUNTER — Ambulatory Visit (INDEPENDENT_AMBULATORY_CARE_PROVIDER_SITE_OTHER): Payer: Medicare Other | Admitting: Ophthalmology

## 2021-06-29 DIAGNOSIS — H4311 Vitreous hemorrhage, right eye: Secondary | ICD-10-CM

## 2021-06-29 DIAGNOSIS — I10 Essential (primary) hypertension: Secondary | ICD-10-CM

## 2021-06-29 DIAGNOSIS — H4313 Vitreous hemorrhage, bilateral: Secondary | ICD-10-CM | POA: Diagnosis not present

## 2021-06-29 DIAGNOSIS — H25813 Combined forms of age-related cataract, bilateral: Secondary | ICD-10-CM

## 2021-06-29 DIAGNOSIS — H43812 Vitreous degeneration, left eye: Secondary | ICD-10-CM

## 2021-06-29 DIAGNOSIS — H4312 Vitreous hemorrhage, left eye: Secondary | ICD-10-CM

## 2021-06-29 DIAGNOSIS — H35033 Hypertensive retinopathy, bilateral: Secondary | ICD-10-CM

## 2021-06-29 DIAGNOSIS — E113513 Type 2 diabetes mellitus with proliferative diabetic retinopathy with macular edema, bilateral: Secondary | ICD-10-CM

## 2021-06-29 NOTE — Progress Notes (Signed)
?[facility] Note ? ?06/29/2021 ? ?  ? ?CHIEF COMPLAINT ?Patient presents for Diabetic Eye Exam ? ?HISTORY OF PRESENT ILLNESS: ?Jonathan Mcneil is a 66 y.o. male who presents to the clinic today for:  ? ?HPI   ? ? Diabetic Eye Exam   ?Vision is stable.  Associated Symptoms Floaters.  Negative for Flashes.  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 So he was told that he is no longer a diabetic. He is trying to get a kidney..  Last Blood Glucose 131.  Last A1C 5.4.  Associated Diagnosis Kidney Disease and Dialysis.  I, the attending physician,  performed the HPI with the patient and updated documentation appropriately. ? ?  ?  ? ? Comments   ?Patient states that the vision has cleared up since his last visit.  ? ?  ?  ?Last edited by Bernarda Caffey, MD on 07/01/2021  2:01 AM.  ?  ?Pt states vision feels like it has a "film" over it, but its not bad, he is still not using heparin in dialysis ? ?Referring physician: ?Merrilee Seashore, MD ?[address],  [place] [address] ? ?HISTORICAL INFORMATION:  ? ?Selected notes from the [facility] ?Referred by Dr. Maryjane Hurter for heme OD  ? ?CURRENT MEDICATIONS: ?No current outpatient medications on file. (Ophthalmic Drugs)  ? ?No current facility-administered medications for this visit. (Ophthalmic Drugs)  ? ?Current Outpatient Medications (Other)  ?Medication Sig  ? acetaminophen (TYLENOL) 650 MG CR tablet Take 1,300 mg by mouth daily as needed for pain.  ? gabapentin (NEURONTIN) 300 MG capsule Take 300 mg by mouth at bedtime.  ? loperamide (IMODIUM A-D) 2 MG tablet Take 1 tablet (2 mg total) by mouth 4 (four) times daily as needed for diarrhea or loose stools.  ? ondansetron (ZOFRAN) 4 MG tablet Take 1 tablet (4 mg total) by mouth every 8 (eight) hours as needed for nausea or vomiting.  ? pantoprazole (PROTONIX) 40 MG tablet Take 1 tablet (40 mg total) by mouth 2 (two) times daily before a meal.  Twice a day x 4 weeks, then daily  ? sertraline (ZOLOFT) 25 MG tablet Take 25 mg by mouth daily as needed (depression).  ? sucroferric oxyhydroxide (VELPHORO) 500 MG chewable tablet Chew 1,500 mg by mouth 3 (three) times daily with meals.  ? ?No current facility-administered medications for this visit. (Other)  ? ?REVIEW OF SYSTEMS: ?ROS   ?Positive for: Gastrointestinal, Neurological, Genitourinary, Endocrine, Eyes ?Negative for: Constitutional, Skin, Musculoskeletal, HENT, Cardiovascular, Respiratory, Psychiatric, Allergic/Imm, Heme/Lymph ?Last edited by Annie Paras, COT on 06/29/2021 12:53 PM.  ?  ? ?ALLERGIES ?Allergies  ?Allergen Reactions  ? Gabapentin Nausea Only  ? Ibuprofen Hives  ? Penicillins Itching and Other (See Comments)  ?  Has patient had a PCN reaction causing immediate rash, facial/tongue/throat swelling, SOB or lightheadedness with hypotension: No ?Has patient had a PCN reaction causing severe rash involving mucus membranes or skin necrosis: No ?Has patient had a PCN reaction that required hospitalization No ?Has patient had a PCN reaction occurring within the last 10 years: Unknown ?If all of the above answers are "NO", then may proceed with Cephalosporin use.  ? Venlafaxine Other (See Comments)  ?  insomnia  ? ?PAST MEDICAL HISTORY ?Past Medical History:  ?Diagnosis Date  ? Cataract   ? Diabetes mellitus without complication ([place])   ? Diabetic retinopathy ([place])   ? ESRD (  end stage renal disease) ([place])   ? GERD (gastroesophageal reflux disease)   ? PMH  ? Hypertension   ? Hypertensive retinopathy   ? Low back pain   ? Lung disease   ? Metabolic acidosis   ? Neuromuscular disorder ([place])   ? peripheral neuropathy  ? Pancreatitis   ? Schizophrenia ([place])   ? does not take medications  ? ?Past Surgical History:  ?Procedure Laterality Date  ? A/V FISTULAGRAM Left 06/03/2018  ? Procedure: A/V FISTULAGRAM;  Surgeon: Marty Heck, MD;  Location: [facility];  Service:  Cardiovascular;  Laterality: Left;  ? AV FISTULA PLACEMENT Left 02/11/2018  ? Procedure: INSERTION OF ARTERIOVENOUS (AV) FISTULA LEFT  ARM;  Surgeon: [name], MD;  Location: [facility];  Service: Vascular;  Laterality: Left;  ? BASCILIC VEIN TRANSPOSITION Left 04/17/2018  ? Procedure: BASILIC VEIN TRANSPOSITION SECOND STAGE;  Surgeon: [name], MD;  Location: [facility];  Service: Vascular;  Laterality: Left;  ? BIOPSY  10/20/2019  ? Procedure: BIOPSY;  Surgeon: Clarene Essex, MD;  Location: [facility];  Service: Endoscopy;;  ? COLONOSCOPY    ? DENTAL SURGERY    ? ESOPHAGOGASTRODUODENOSCOPY N/A 06/16/2021  ? Procedure: ESOPHAGOGASTRODUODENOSCOPY (EGD);  Surgeon: Wilford Corner, MD;  Location: [facility];  Service: Gastroenterology;  Laterality: N/A;  ? ESOPHAGOGASTRODUODENOSCOPY (EGD) WITH PROPOFOL N/A 10/20/2019  ? Procedure: ESOPHAGOGASTRODUODENOSCOPY (EGD) WITH PROPOFOL;  Surgeon: Clarene Essex, MD;  Location: [facility];  Service: Endoscopy;  Laterality: N/A;  ? INSERTION OF DIALYSIS CATHETER Right 02/25/2018  ? Procedure: INSERTION OF DIALYSIS CATHETER;  Surgeon: [name], MD;  Location: [facility];  Service: Vascular;  Laterality: Right;  ? PERIPHERAL VASCULAR BALLOON ANGIOPLASTY  06/03/2018  ? Procedure: PERIPHERAL VASCULAR BALLOON ANGIOPLASTY;  Surgeon: Marty Heck, MD;  Location: [facility];  Service: Cardiovascular;;  left a/v fistula  ? ?FAMILY HISTORY ?Family History  ?Problem Relation Age of Onset  ? Kidney failure Mother   ? Diabetes Father   ? Blindness Brother   ? ?SOCIAL HISTORY ?Social History  ? ?Tobacco Use  ? Smoking status: Former  ? Smokeless tobacco: Never  ?Vaping Use  ? Vaping Use: Never used  ?Substance Use Topics  ? Alcohol use: No  ? Drug use: Not Currently  ?  Types: Cocaine, Marijuana  ?  Comment: smoked marijuana a few days ago; he denies using cocaine  ?  ? ?  ?OPHTHALMIC EXAM: ? ?Base Eye Exam   ? ? Visual Acuity (Snellen -  Linear)   ? ?   Right Left  ? Dist [place] 20/25 +2 20/20 +2  ? ?  ?  ? ? Tonometry (Tonopen, 12:59 PM)   ? ?   Right Left  ? Pressure 18 19  ? ?  ?  ? ? Pupils   ? ?   Dark Light Shape React APD  ? Right 2 2 Round Minimal None  ? Left 2 2 Round Minimal None  ? ?  ?  ? ? Visual Fields   ? ?   Left Right  ?  Full Full  ? ?  ?  ? ? Extraocular Movement   ? ?   Right Left  ?  Full Full  ? ?  ?  ? ? Neuro/Psych   ? ? Oriented x3: Yes  ? Mood/Affect: Normal  ? ?  ?  ? ? Dilation   ? ? Both eyes: 1.0% Mydriacyl, 2.5% Phenylephrine @ 12:55  PM  ? ?  ?  ? ?  ? ?Slit Lamp and Fundus Exam   ? ? Slit Lamp Exam   ? ?   Right Left  ? Lids/Lashes Dermatochalasis - upper lid Dermatochalasis - upper lid  ? Conjunctiva/Sclera Melanosis Melanosis, temporal pinguecula  ? Cornea trace Punctate epithelial erosions, mild arcus, mild tear film debris trace Punctate epithelial erosions, mild arcus, mild tear film debris  ? Anterior Chamber Deep and quiet Deep and quiet  ? Iris Round and dilated, No NVI Round and poorly dilated, No NVI  ? Lens 2-3+ Nuclear sclerosis, 2-3+ Cortical cataract 2-3+ Nuclear sclerosis, 2-3+ Cortical cataract  ? Anterior Vitreous Vitreous syneresis, diffuse VH -- improved, residual blood stained vitreous condensations settled inferiorly -- turning white Vitreous syneresis, blood stained vitreous condensations - improved, mild blood clots settling inferiorly and improving, Posterior vitreous detachment  ? ?  ?  ? ? Fundus Exam   ? ?   Right Left  ? Disc Mild Pallor, Sharp rim, focal PPP/PPA Mild Pallor, Sharp rim  ? C/D Ratio 0.4 0.5  ? Macula Flat, Good foveal reflex, RPE mottling and clumping, no edema, trace MA, mild ERM Flat, good foveal reflex, ERM, RPE mottling and clumping, scattered Microaneurysms/DBH greatest temporal macula  ? Vessels attenuated, mild Tortuosity, mild Copper wiring, nasal NV improved/regressing mild attenuation, mild Copper wiring, IN NV, Tortuous  ? Periphery Attached, 360 IRH/DBH - improved,  pre-retinal heme settled inferiorly, red blood clots inferiorly turning white, 360 PRP with room for fill in. Attached, scattered IRH/DBH greatest nasally, good 360 PRP changes, red blood clots inferiorly turning white, No

## 2021-06-30 DIAGNOSIS — N2581 Secondary hyperparathyroidism of renal origin: Secondary | ICD-10-CM | POA: Diagnosis not present

## 2021-06-30 DIAGNOSIS — L299 Pruritus, unspecified: Secondary | ICD-10-CM | POA: Diagnosis not present

## 2021-06-30 DIAGNOSIS — Z992 Dependence on renal dialysis: Secondary | ICD-10-CM | POA: Diagnosis not present

## 2021-06-30 DIAGNOSIS — R52 Pain, unspecified: Secondary | ICD-10-CM | POA: Diagnosis not present

## 2021-06-30 DIAGNOSIS — E1129 Type 2 diabetes mellitus with other diabetic kidney complication: Secondary | ICD-10-CM | POA: Diagnosis not present

## 2021-06-30 DIAGNOSIS — D631 Anemia in chronic kidney disease: Secondary | ICD-10-CM | POA: Diagnosis not present

## 2021-06-30 DIAGNOSIS — N186 End stage renal disease: Secondary | ICD-10-CM | POA: Diagnosis not present

## 2021-07-01 ENCOUNTER — Encounter (INDEPENDENT_AMBULATORY_CARE_PROVIDER_SITE_OTHER): Payer: Self-pay | Admitting: Ophthalmology

## 2021-07-02 ENCOUNTER — Ambulatory Visit (HOSPITAL_COMMUNITY)
Admission: RE | Admit: 2021-07-02 | Discharge: 2021-07-02 | Disposition: A | Discharge status: Home / Self Care | Payer: Medicare Other | Source: Ambulatory Visit | Attending: Podiatry | Admitting: Podiatry

## 2021-07-02 DIAGNOSIS — I739 Peripheral vascular disease, unspecified: Secondary | ICD-10-CM | POA: Insufficient documentation

## 2021-07-03 DIAGNOSIS — N2581 Secondary hyperparathyroidism of renal origin: Secondary | ICD-10-CM | POA: Diagnosis not present

## 2021-07-03 DIAGNOSIS — N186 End stage renal disease: Secondary | ICD-10-CM | POA: Diagnosis not present

## 2021-07-03 DIAGNOSIS — D631 Anemia in chronic kidney disease: Secondary | ICD-10-CM | POA: Diagnosis not present

## 2021-07-03 DIAGNOSIS — Z992 Dependence on renal dialysis: Secondary | ICD-10-CM | POA: Diagnosis not present

## 2021-07-03 DIAGNOSIS — R52 Pain, unspecified: Secondary | ICD-10-CM | POA: Diagnosis not present

## 2021-07-03 DIAGNOSIS — L299 Pruritus, unspecified: Secondary | ICD-10-CM | POA: Diagnosis not present

## 2021-07-03 DIAGNOSIS — E1129 Type 2 diabetes mellitus with other diabetic kidney complication: Secondary | ICD-10-CM | POA: Diagnosis not present

## 2021-07-05 DIAGNOSIS — R52 Pain, unspecified: Secondary | ICD-10-CM | POA: Diagnosis not present

## 2021-07-05 DIAGNOSIS — Z992 Dependence on renal dialysis: Secondary | ICD-10-CM | POA: Diagnosis not present

## 2021-07-05 DIAGNOSIS — D631 Anemia in chronic kidney disease: Secondary | ICD-10-CM | POA: Diagnosis not present

## 2021-07-05 DIAGNOSIS — N2581 Secondary hyperparathyroidism of renal origin: Secondary | ICD-10-CM | POA: Diagnosis not present

## 2021-07-05 DIAGNOSIS — N186 End stage renal disease: Secondary | ICD-10-CM | POA: Diagnosis not present

## 2021-07-05 DIAGNOSIS — E1129 Type 2 diabetes mellitus with other diabetic kidney complication: Secondary | ICD-10-CM | POA: Diagnosis not present

## 2021-07-05 DIAGNOSIS — L299 Pruritus, unspecified: Secondary | ICD-10-CM | POA: Diagnosis not present

## 2021-07-07 DIAGNOSIS — N186 End stage renal disease: Secondary | ICD-10-CM | POA: Diagnosis not present

## 2021-07-07 DIAGNOSIS — L299 Pruritus, unspecified: Secondary | ICD-10-CM | POA: Diagnosis not present

## 2021-07-07 DIAGNOSIS — E1129 Type 2 diabetes mellitus with other diabetic kidney complication: Secondary | ICD-10-CM | POA: Diagnosis not present

## 2021-07-07 DIAGNOSIS — N2581 Secondary hyperparathyroidism of renal origin: Secondary | ICD-10-CM | POA: Diagnosis not present

## 2021-07-07 DIAGNOSIS — R52 Pain, unspecified: Secondary | ICD-10-CM | POA: Diagnosis not present

## 2021-07-07 DIAGNOSIS — D631 Anemia in chronic kidney disease: Secondary | ICD-10-CM | POA: Diagnosis not present

## 2021-07-07 DIAGNOSIS — Z992 Dependence on renal dialysis: Secondary | ICD-10-CM | POA: Diagnosis not present

## 2021-07-09 DIAGNOSIS — R9439 Abnormal result of other cardiovascular function study: Secondary | ICD-10-CM | POA: Diagnosis not present

## 2021-07-09 DIAGNOSIS — N186 End stage renal disease: Secondary | ICD-10-CM | POA: Diagnosis not present

## 2021-07-09 DIAGNOSIS — Z992 Dependence on renal dialysis: Secondary | ICD-10-CM | POA: Diagnosis not present

## 2021-07-10 DIAGNOSIS — N2581 Secondary hyperparathyroidism of renal origin: Secondary | ICD-10-CM | POA: Diagnosis not present

## 2021-07-10 DIAGNOSIS — E1129 Type 2 diabetes mellitus with other diabetic kidney complication: Secondary | ICD-10-CM | POA: Diagnosis not present

## 2021-07-10 DIAGNOSIS — L299 Pruritus, unspecified: Secondary | ICD-10-CM | POA: Diagnosis not present

## 2021-07-10 DIAGNOSIS — D631 Anemia in chronic kidney disease: Secondary | ICD-10-CM | POA: Diagnosis not present

## 2021-07-10 DIAGNOSIS — Z992 Dependence on renal dialysis: Secondary | ICD-10-CM | POA: Diagnosis not present

## 2021-07-10 DIAGNOSIS — R52 Pain, unspecified: Secondary | ICD-10-CM | POA: Diagnosis not present

## 2021-07-10 DIAGNOSIS — N186 End stage renal disease: Secondary | ICD-10-CM | POA: Diagnosis not present

## 2021-07-12 DIAGNOSIS — L299 Pruritus, unspecified: Secondary | ICD-10-CM | POA: Diagnosis not present

## 2021-07-12 DIAGNOSIS — E1129 Type 2 diabetes mellitus with other diabetic kidney complication: Secondary | ICD-10-CM | POA: Diagnosis not present

## 2021-07-12 DIAGNOSIS — D631 Anemia in chronic kidney disease: Secondary | ICD-10-CM | POA: Diagnosis not present

## 2021-07-12 DIAGNOSIS — N2581 Secondary hyperparathyroidism of renal origin: Secondary | ICD-10-CM | POA: Diagnosis not present

## 2021-07-12 DIAGNOSIS — R52 Pain, unspecified: Secondary | ICD-10-CM | POA: Diagnosis not present

## 2021-07-12 DIAGNOSIS — N186 End stage renal disease: Secondary | ICD-10-CM | POA: Diagnosis not present

## 2021-07-12 DIAGNOSIS — Z992 Dependence on renal dialysis: Secondary | ICD-10-CM | POA: Diagnosis not present

## 2021-07-12 NOTE — Progress Notes (Signed)
Called to give results,no answer, left vmessage for call back

## 2021-07-13 ENCOUNTER — Telehealth: Payer: Self-pay | Admitting: Podiatry

## 2021-07-13 NOTE — Telephone Encounter (Signed)
Patient wife left message on voicemail that she was returning a call for test results, please call back.

## 2021-07-14 DIAGNOSIS — R52 Pain, unspecified: Secondary | ICD-10-CM | POA: Diagnosis not present

## 2021-07-14 DIAGNOSIS — L299 Pruritus, unspecified: Secondary | ICD-10-CM | POA: Diagnosis not present

## 2021-07-14 DIAGNOSIS — N2581 Secondary hyperparathyroidism of renal origin: Secondary | ICD-10-CM | POA: Diagnosis not present

## 2021-07-14 DIAGNOSIS — Z992 Dependence on renal dialysis: Secondary | ICD-10-CM | POA: Diagnosis not present

## 2021-07-14 DIAGNOSIS — E1129 Type 2 diabetes mellitus with other diabetic kidney complication: Secondary | ICD-10-CM | POA: Diagnosis not present

## 2021-07-14 DIAGNOSIS — N186 End stage renal disease: Secondary | ICD-10-CM | POA: Diagnosis not present

## 2021-07-14 DIAGNOSIS — D631 Anemia in chronic kidney disease: Secondary | ICD-10-CM | POA: Diagnosis not present

## 2021-07-17 DIAGNOSIS — R52 Pain, unspecified: Secondary | ICD-10-CM | POA: Diagnosis not present

## 2021-07-17 DIAGNOSIS — N186 End stage renal disease: Secondary | ICD-10-CM | POA: Diagnosis not present

## 2021-07-17 DIAGNOSIS — L299 Pruritus, unspecified: Secondary | ICD-10-CM | POA: Diagnosis not present

## 2021-07-17 DIAGNOSIS — Z992 Dependence on renal dialysis: Secondary | ICD-10-CM | POA: Diagnosis not present

## 2021-07-17 DIAGNOSIS — E1129 Type 2 diabetes mellitus with other diabetic kidney complication: Secondary | ICD-10-CM | POA: Diagnosis not present

## 2021-07-17 DIAGNOSIS — N2581 Secondary hyperparathyroidism of renal origin: Secondary | ICD-10-CM | POA: Diagnosis not present

## 2021-07-17 DIAGNOSIS — D631 Anemia in chronic kidney disease: Secondary | ICD-10-CM | POA: Diagnosis not present

## 2021-07-19 DIAGNOSIS — L299 Pruritus, unspecified: Secondary | ICD-10-CM | POA: Diagnosis not present

## 2021-07-19 DIAGNOSIS — R52 Pain, unspecified: Secondary | ICD-10-CM | POA: Diagnosis not present

## 2021-07-19 DIAGNOSIS — N2581 Secondary hyperparathyroidism of renal origin: Secondary | ICD-10-CM | POA: Diagnosis not present

## 2021-07-19 DIAGNOSIS — N186 End stage renal disease: Secondary | ICD-10-CM | POA: Diagnosis not present

## 2021-07-19 DIAGNOSIS — E1129 Type 2 diabetes mellitus with other diabetic kidney complication: Secondary | ICD-10-CM | POA: Diagnosis not present

## 2021-07-19 DIAGNOSIS — Z992 Dependence on renal dialysis: Secondary | ICD-10-CM | POA: Diagnosis not present

## 2021-07-19 DIAGNOSIS — D631 Anemia in chronic kidney disease: Secondary | ICD-10-CM | POA: Diagnosis not present

## 2021-07-19 NOTE — Progress Notes (Signed)
Called patient, 2nd attempt to reach, no answer, left message for call back

## 2021-07-20 NOTE — Telephone Encounter (Signed)
Patient is calling back for his test results, may leave a detailed voicemail message. Missed previous call from physician. ?

## 2021-07-21 DIAGNOSIS — N186 End stage renal disease: Secondary | ICD-10-CM | POA: Diagnosis not present

## 2021-07-21 DIAGNOSIS — D631 Anemia in chronic kidney disease: Secondary | ICD-10-CM | POA: Diagnosis not present

## 2021-07-21 DIAGNOSIS — Z992 Dependence on renal dialysis: Secondary | ICD-10-CM | POA: Diagnosis not present

## 2021-07-21 DIAGNOSIS — N2581 Secondary hyperparathyroidism of renal origin: Secondary | ICD-10-CM | POA: Diagnosis not present

## 2021-07-21 DIAGNOSIS — R52 Pain, unspecified: Secondary | ICD-10-CM | POA: Diagnosis not present

## 2021-07-21 DIAGNOSIS — E1129 Type 2 diabetes mellitus with other diabetic kidney complication: Secondary | ICD-10-CM | POA: Diagnosis not present

## 2021-07-21 DIAGNOSIS — L299 Pruritus, unspecified: Secondary | ICD-10-CM | POA: Diagnosis not present

## 2021-07-22 DIAGNOSIS — E1122 Type 2 diabetes mellitus with diabetic chronic kidney disease: Secondary | ICD-10-CM | POA: Diagnosis not present

## 2021-07-22 DIAGNOSIS — Z992 Dependence on renal dialysis: Secondary | ICD-10-CM | POA: Diagnosis not present

## 2021-07-22 DIAGNOSIS — N186 End stage renal disease: Secondary | ICD-10-CM | POA: Diagnosis not present

## 2021-07-24 DIAGNOSIS — N2581 Secondary hyperparathyroidism of renal origin: Secondary | ICD-10-CM | POA: Diagnosis not present

## 2021-07-24 DIAGNOSIS — R52 Pain, unspecified: Secondary | ICD-10-CM | POA: Diagnosis not present

## 2021-07-24 DIAGNOSIS — N186 End stage renal disease: Secondary | ICD-10-CM | POA: Diagnosis not present

## 2021-07-24 DIAGNOSIS — Z992 Dependence on renal dialysis: Secondary | ICD-10-CM | POA: Diagnosis not present

## 2021-07-24 DIAGNOSIS — L299 Pruritus, unspecified: Secondary | ICD-10-CM | POA: Diagnosis not present

## 2021-07-26 DIAGNOSIS — L299 Pruritus, unspecified: Secondary | ICD-10-CM | POA: Diagnosis not present

## 2021-07-26 DIAGNOSIS — N186 End stage renal disease: Secondary | ICD-10-CM | POA: Diagnosis not present

## 2021-07-26 DIAGNOSIS — R52 Pain, unspecified: Secondary | ICD-10-CM | POA: Diagnosis not present

## 2021-07-26 DIAGNOSIS — N2581 Secondary hyperparathyroidism of renal origin: Secondary | ICD-10-CM | POA: Diagnosis not present

## 2021-07-26 DIAGNOSIS — Z992 Dependence on renal dialysis: Secondary | ICD-10-CM | POA: Diagnosis not present

## 2021-07-28 DIAGNOSIS — L299 Pruritus, unspecified: Secondary | ICD-10-CM | POA: Diagnosis not present

## 2021-07-28 DIAGNOSIS — N186 End stage renal disease: Secondary | ICD-10-CM | POA: Diagnosis not present

## 2021-07-28 DIAGNOSIS — R52 Pain, unspecified: Secondary | ICD-10-CM | POA: Diagnosis not present

## 2021-07-28 DIAGNOSIS — N2581 Secondary hyperparathyroidism of renal origin: Secondary | ICD-10-CM | POA: Diagnosis not present

## 2021-07-28 DIAGNOSIS — Z992 Dependence on renal dialysis: Secondary | ICD-10-CM | POA: Diagnosis not present

## 2021-07-31 DIAGNOSIS — R52 Pain, unspecified: Secondary | ICD-10-CM | POA: Diagnosis not present

## 2021-07-31 DIAGNOSIS — N2581 Secondary hyperparathyroidism of renal origin: Secondary | ICD-10-CM | POA: Diagnosis not present

## 2021-07-31 DIAGNOSIS — N186 End stage renal disease: Secondary | ICD-10-CM | POA: Diagnosis not present

## 2021-07-31 DIAGNOSIS — Z992 Dependence on renal dialysis: Secondary | ICD-10-CM | POA: Diagnosis not present

## 2021-07-31 DIAGNOSIS — L299 Pruritus, unspecified: Secondary | ICD-10-CM | POA: Diagnosis not present

## 2021-08-02 DIAGNOSIS — R52 Pain, unspecified: Secondary | ICD-10-CM | POA: Diagnosis not present

## 2021-08-02 DIAGNOSIS — N2581 Secondary hyperparathyroidism of renal origin: Secondary | ICD-10-CM | POA: Diagnosis not present

## 2021-08-02 DIAGNOSIS — N186 End stage renal disease: Secondary | ICD-10-CM | POA: Diagnosis not present

## 2021-08-02 DIAGNOSIS — L299 Pruritus, unspecified: Secondary | ICD-10-CM | POA: Diagnosis not present

## 2021-08-02 DIAGNOSIS — Z992 Dependence on renal dialysis: Secondary | ICD-10-CM | POA: Diagnosis not present

## 2021-08-04 DIAGNOSIS — N186 End stage renal disease: Secondary | ICD-10-CM | POA: Diagnosis not present

## 2021-08-04 DIAGNOSIS — Z992 Dependence on renal dialysis: Secondary | ICD-10-CM | POA: Diagnosis not present

## 2021-08-04 DIAGNOSIS — N2581 Secondary hyperparathyroidism of renal origin: Secondary | ICD-10-CM | POA: Diagnosis not present

## 2021-08-04 DIAGNOSIS — R52 Pain, unspecified: Secondary | ICD-10-CM | POA: Diagnosis not present

## 2021-08-04 DIAGNOSIS — L299 Pruritus, unspecified: Secondary | ICD-10-CM | POA: Diagnosis not present

## 2021-08-07 DIAGNOSIS — N2581 Secondary hyperparathyroidism of renal origin: Secondary | ICD-10-CM | POA: Diagnosis not present

## 2021-08-07 DIAGNOSIS — R52 Pain, unspecified: Secondary | ICD-10-CM | POA: Diagnosis not present

## 2021-08-07 DIAGNOSIS — Z992 Dependence on renal dialysis: Secondary | ICD-10-CM | POA: Diagnosis not present

## 2021-08-07 DIAGNOSIS — L299 Pruritus, unspecified: Secondary | ICD-10-CM | POA: Diagnosis not present

## 2021-08-07 DIAGNOSIS — N186 End stage renal disease: Secondary | ICD-10-CM | POA: Diagnosis not present

## 2021-08-09 DIAGNOSIS — L299 Pruritus, unspecified: Secondary | ICD-10-CM | POA: Diagnosis not present

## 2021-08-09 DIAGNOSIS — N186 End stage renal disease: Secondary | ICD-10-CM | POA: Diagnosis not present

## 2021-08-09 DIAGNOSIS — N2581 Secondary hyperparathyroidism of renal origin: Secondary | ICD-10-CM | POA: Diagnosis not present

## 2021-08-09 DIAGNOSIS — R52 Pain, unspecified: Secondary | ICD-10-CM | POA: Diagnosis not present

## 2021-08-09 DIAGNOSIS — Z992 Dependence on renal dialysis: Secondary | ICD-10-CM | POA: Diagnosis not present

## 2021-08-11 DIAGNOSIS — R52 Pain, unspecified: Secondary | ICD-10-CM | POA: Diagnosis not present

## 2021-08-11 DIAGNOSIS — L299 Pruritus, unspecified: Secondary | ICD-10-CM | POA: Diagnosis not present

## 2021-08-11 DIAGNOSIS — Z992 Dependence on renal dialysis: Secondary | ICD-10-CM | POA: Diagnosis not present

## 2021-08-11 DIAGNOSIS — N186 End stage renal disease: Secondary | ICD-10-CM | POA: Diagnosis not present

## 2021-08-11 DIAGNOSIS — N2581 Secondary hyperparathyroidism of renal origin: Secondary | ICD-10-CM | POA: Diagnosis not present

## 2021-08-14 DIAGNOSIS — Z992 Dependence on renal dialysis: Secondary | ICD-10-CM | POA: Diagnosis not present

## 2021-08-14 DIAGNOSIS — R52 Pain, unspecified: Secondary | ICD-10-CM | POA: Diagnosis not present

## 2021-08-14 DIAGNOSIS — N186 End stage renal disease: Secondary | ICD-10-CM | POA: Diagnosis not present

## 2021-08-14 DIAGNOSIS — N2581 Secondary hyperparathyroidism of renal origin: Secondary | ICD-10-CM | POA: Diagnosis not present

## 2021-08-14 DIAGNOSIS — L299 Pruritus, unspecified: Secondary | ICD-10-CM | POA: Diagnosis not present

## 2021-08-15 NOTE — Progress Notes (Signed)
Called patient 3rd attempt, no answer, left vmessage for return call back.

## 2021-08-16 DIAGNOSIS — Z992 Dependence on renal dialysis: Secondary | ICD-10-CM | POA: Diagnosis not present

## 2021-08-16 DIAGNOSIS — N186 End stage renal disease: Secondary | ICD-10-CM | POA: Diagnosis not present

## 2021-08-16 DIAGNOSIS — N2581 Secondary hyperparathyroidism of renal origin: Secondary | ICD-10-CM | POA: Diagnosis not present

## 2021-08-16 DIAGNOSIS — L299 Pruritus, unspecified: Secondary | ICD-10-CM | POA: Diagnosis not present

## 2021-08-16 DIAGNOSIS — R52 Pain, unspecified: Secondary | ICD-10-CM | POA: Diagnosis not present

## 2021-08-18 DIAGNOSIS — R52 Pain, unspecified: Secondary | ICD-10-CM | POA: Diagnosis not present

## 2021-08-18 DIAGNOSIS — N2581 Secondary hyperparathyroidism of renal origin: Secondary | ICD-10-CM | POA: Diagnosis not present

## 2021-08-18 DIAGNOSIS — Z992 Dependence on renal dialysis: Secondary | ICD-10-CM | POA: Diagnosis not present

## 2021-08-18 DIAGNOSIS — N186 End stage renal disease: Secondary | ICD-10-CM | POA: Diagnosis not present

## 2021-08-18 DIAGNOSIS — L299 Pruritus, unspecified: Secondary | ICD-10-CM | POA: Diagnosis not present

## 2021-08-21 DIAGNOSIS — L299 Pruritus, unspecified: Secondary | ICD-10-CM | POA: Diagnosis not present

## 2021-08-21 DIAGNOSIS — R52 Pain, unspecified: Secondary | ICD-10-CM | POA: Diagnosis not present

## 2021-08-21 DIAGNOSIS — Z992 Dependence on renal dialysis: Secondary | ICD-10-CM | POA: Diagnosis not present

## 2021-08-21 DIAGNOSIS — N186 End stage renal disease: Secondary | ICD-10-CM | POA: Diagnosis not present

## 2021-08-21 DIAGNOSIS — N2581 Secondary hyperparathyroidism of renal origin: Secondary | ICD-10-CM | POA: Diagnosis not present

## 2021-08-22 DIAGNOSIS — E1122 Type 2 diabetes mellitus with diabetic chronic kidney disease: Secondary | ICD-10-CM | POA: Diagnosis not present

## 2021-08-22 DIAGNOSIS — N186 End stage renal disease: Secondary | ICD-10-CM | POA: Diagnosis not present

## 2021-08-22 DIAGNOSIS — Z992 Dependence on renal dialysis: Secondary | ICD-10-CM | POA: Diagnosis not present

## 2021-08-23 ENCOUNTER — Encounter: Payer: Self-pay | Admitting: *Deleted

## 2021-08-23 DIAGNOSIS — E1165 Type 2 diabetes mellitus with hyperglycemia: Secondary | ICD-10-CM | POA: Diagnosis not present

## 2021-08-23 DIAGNOSIS — E1129 Type 2 diabetes mellitus with other diabetic kidney complication: Secondary | ICD-10-CM | POA: Diagnosis not present

## 2021-08-23 DIAGNOSIS — N2581 Secondary hyperparathyroidism of renal origin: Secondary | ICD-10-CM | POA: Diagnosis not present

## 2021-08-23 DIAGNOSIS — E782 Mixed hyperlipidemia: Secondary | ICD-10-CM | POA: Diagnosis not present

## 2021-08-23 DIAGNOSIS — R5383 Other fatigue: Secondary | ICD-10-CM | POA: Diagnosis not present

## 2021-08-23 DIAGNOSIS — Z992 Dependence on renal dialysis: Secondary | ICD-10-CM | POA: Diagnosis not present

## 2021-08-23 DIAGNOSIS — N186 End stage renal disease: Secondary | ICD-10-CM | POA: Diagnosis not present

## 2021-08-23 DIAGNOSIS — L299 Pruritus, unspecified: Secondary | ICD-10-CM | POA: Diagnosis not present

## 2021-08-23 DIAGNOSIS — R52 Pain, unspecified: Secondary | ICD-10-CM | POA: Diagnosis not present

## 2021-08-23 DIAGNOSIS — I10 Essential (primary) hypertension: Secondary | ICD-10-CM | POA: Diagnosis not present

## 2021-08-23 NOTE — Progress Notes (Signed)
Result letter mailed to patient on 08/23/21.

## 2021-08-23 NOTE — Progress Notes (Signed)
We mailed a letter to patient since unable to reach by phone after several attempts.

## 2021-08-24 NOTE — Telephone Encounter (Signed)
Pt is calling back for ultra sound results. Not sure is Dr Jacqualyn Posey was calling back for additional information that he didn't get last week. If anything and he doesn't answer the phone, a detailed message can be left.  Please advise

## 2021-08-25 DIAGNOSIS — R52 Pain, unspecified: Secondary | ICD-10-CM | POA: Diagnosis not present

## 2021-08-25 DIAGNOSIS — N186 End stage renal disease: Secondary | ICD-10-CM | POA: Diagnosis not present

## 2021-08-25 DIAGNOSIS — Z992 Dependence on renal dialysis: Secondary | ICD-10-CM | POA: Diagnosis not present

## 2021-08-25 DIAGNOSIS — E1129 Type 2 diabetes mellitus with other diabetic kidney complication: Secondary | ICD-10-CM | POA: Diagnosis not present

## 2021-08-25 DIAGNOSIS — N2581 Secondary hyperparathyroidism of renal origin: Secondary | ICD-10-CM | POA: Diagnosis not present

## 2021-08-25 DIAGNOSIS — L299 Pruritus, unspecified: Secondary | ICD-10-CM | POA: Diagnosis not present

## 2021-08-28 DIAGNOSIS — E1129 Type 2 diabetes mellitus with other diabetic kidney complication: Secondary | ICD-10-CM | POA: Diagnosis not present

## 2021-08-28 DIAGNOSIS — R52 Pain, unspecified: Secondary | ICD-10-CM | POA: Diagnosis not present

## 2021-08-28 DIAGNOSIS — N186 End stage renal disease: Secondary | ICD-10-CM | POA: Diagnosis not present

## 2021-08-28 DIAGNOSIS — N2581 Secondary hyperparathyroidism of renal origin: Secondary | ICD-10-CM | POA: Diagnosis not present

## 2021-08-28 DIAGNOSIS — Z992 Dependence on renal dialysis: Secondary | ICD-10-CM | POA: Diagnosis not present

## 2021-08-28 DIAGNOSIS — L299 Pruritus, unspecified: Secondary | ICD-10-CM | POA: Diagnosis not present

## 2021-08-30 DIAGNOSIS — N186 End stage renal disease: Secondary | ICD-10-CM | POA: Diagnosis not present

## 2021-08-30 DIAGNOSIS — N2581 Secondary hyperparathyroidism of renal origin: Secondary | ICD-10-CM | POA: Diagnosis not present

## 2021-08-30 DIAGNOSIS — E1129 Type 2 diabetes mellitus with other diabetic kidney complication: Secondary | ICD-10-CM | POA: Diagnosis not present

## 2021-08-30 DIAGNOSIS — R52 Pain, unspecified: Secondary | ICD-10-CM | POA: Diagnosis not present

## 2021-08-30 DIAGNOSIS — Z992 Dependence on renal dialysis: Secondary | ICD-10-CM | POA: Diagnosis not present

## 2021-08-30 DIAGNOSIS — L299 Pruritus, unspecified: Secondary | ICD-10-CM | POA: Diagnosis not present

## 2021-09-01 DIAGNOSIS — Z992 Dependence on renal dialysis: Secondary | ICD-10-CM | POA: Diagnosis not present

## 2021-09-01 DIAGNOSIS — E1129 Type 2 diabetes mellitus with other diabetic kidney complication: Secondary | ICD-10-CM | POA: Diagnosis not present

## 2021-09-01 DIAGNOSIS — N2581 Secondary hyperparathyroidism of renal origin: Secondary | ICD-10-CM | POA: Diagnosis not present

## 2021-09-01 DIAGNOSIS — R52 Pain, unspecified: Secondary | ICD-10-CM | POA: Diagnosis not present

## 2021-09-01 DIAGNOSIS — N186 End stage renal disease: Secondary | ICD-10-CM | POA: Diagnosis not present

## 2021-09-01 DIAGNOSIS — L299 Pruritus, unspecified: Secondary | ICD-10-CM | POA: Diagnosis not present

## 2021-09-04 DIAGNOSIS — Z992 Dependence on renal dialysis: Secondary | ICD-10-CM | POA: Diagnosis not present

## 2021-09-04 DIAGNOSIS — R52 Pain, unspecified: Secondary | ICD-10-CM | POA: Diagnosis not present

## 2021-09-04 DIAGNOSIS — N186 End stage renal disease: Secondary | ICD-10-CM | POA: Diagnosis not present

## 2021-09-04 DIAGNOSIS — L299 Pruritus, unspecified: Secondary | ICD-10-CM | POA: Diagnosis not present

## 2021-09-04 DIAGNOSIS — N2581 Secondary hyperparathyroidism of renal origin: Secondary | ICD-10-CM | POA: Diagnosis not present

## 2021-09-04 DIAGNOSIS — E1129 Type 2 diabetes mellitus with other diabetic kidney complication: Secondary | ICD-10-CM | POA: Diagnosis not present

## 2021-09-05 DIAGNOSIS — I509 Heart failure, unspecified: Secondary | ICD-10-CM | POA: Diagnosis not present

## 2021-09-06 DIAGNOSIS — L299 Pruritus, unspecified: Secondary | ICD-10-CM | POA: Diagnosis not present

## 2021-09-06 DIAGNOSIS — N2581 Secondary hyperparathyroidism of renal origin: Secondary | ICD-10-CM | POA: Diagnosis not present

## 2021-09-06 DIAGNOSIS — Z992 Dependence on renal dialysis: Secondary | ICD-10-CM | POA: Diagnosis not present

## 2021-09-06 DIAGNOSIS — R52 Pain, unspecified: Secondary | ICD-10-CM | POA: Diagnosis not present

## 2021-09-06 DIAGNOSIS — E1129 Type 2 diabetes mellitus with other diabetic kidney complication: Secondary | ICD-10-CM | POA: Diagnosis not present

## 2021-09-06 DIAGNOSIS — N186 End stage renal disease: Secondary | ICD-10-CM | POA: Diagnosis not present

## 2021-09-08 DIAGNOSIS — N2581 Secondary hyperparathyroidism of renal origin: Secondary | ICD-10-CM | POA: Diagnosis not present

## 2021-09-08 DIAGNOSIS — N186 End stage renal disease: Secondary | ICD-10-CM | POA: Diagnosis not present

## 2021-09-08 DIAGNOSIS — R52 Pain, unspecified: Secondary | ICD-10-CM | POA: Diagnosis not present

## 2021-09-08 DIAGNOSIS — Z992 Dependence on renal dialysis: Secondary | ICD-10-CM | POA: Diagnosis not present

## 2021-09-08 DIAGNOSIS — E1129 Type 2 diabetes mellitus with other diabetic kidney complication: Secondary | ICD-10-CM | POA: Diagnosis not present

## 2021-09-08 DIAGNOSIS — L299 Pruritus, unspecified: Secondary | ICD-10-CM | POA: Diagnosis not present

## 2021-09-11 DIAGNOSIS — Z992 Dependence on renal dialysis: Secondary | ICD-10-CM | POA: Diagnosis not present

## 2021-09-11 DIAGNOSIS — N2581 Secondary hyperparathyroidism of renal origin: Secondary | ICD-10-CM | POA: Diagnosis not present

## 2021-09-11 DIAGNOSIS — R52 Pain, unspecified: Secondary | ICD-10-CM | POA: Diagnosis not present

## 2021-09-11 DIAGNOSIS — L299 Pruritus, unspecified: Secondary | ICD-10-CM | POA: Diagnosis not present

## 2021-09-11 DIAGNOSIS — E1129 Type 2 diabetes mellitus with other diabetic kidney complication: Secondary | ICD-10-CM | POA: Diagnosis not present

## 2021-09-11 DIAGNOSIS — N186 End stage renal disease: Secondary | ICD-10-CM | POA: Diagnosis not present

## 2021-09-13 DIAGNOSIS — N186 End stage renal disease: Secondary | ICD-10-CM | POA: Diagnosis not present

## 2021-09-13 DIAGNOSIS — N2581 Secondary hyperparathyroidism of renal origin: Secondary | ICD-10-CM | POA: Diagnosis not present

## 2021-09-13 DIAGNOSIS — R52 Pain, unspecified: Secondary | ICD-10-CM | POA: Diagnosis not present

## 2021-09-13 DIAGNOSIS — L299 Pruritus, unspecified: Secondary | ICD-10-CM | POA: Diagnosis not present

## 2021-09-13 DIAGNOSIS — E1129 Type 2 diabetes mellitus with other diabetic kidney complication: Secondary | ICD-10-CM | POA: Diagnosis not present

## 2021-09-13 DIAGNOSIS — Z992 Dependence on renal dialysis: Secondary | ICD-10-CM | POA: Diagnosis not present

## 2021-09-15 DIAGNOSIS — L299 Pruritus, unspecified: Secondary | ICD-10-CM | POA: Diagnosis not present

## 2021-09-15 DIAGNOSIS — N186 End stage renal disease: Secondary | ICD-10-CM | POA: Diagnosis not present

## 2021-09-15 DIAGNOSIS — Z992 Dependence on renal dialysis: Secondary | ICD-10-CM | POA: Diagnosis not present

## 2021-09-15 DIAGNOSIS — E1129 Type 2 diabetes mellitus with other diabetic kidney complication: Secondary | ICD-10-CM | POA: Diagnosis not present

## 2021-09-15 DIAGNOSIS — R52 Pain, unspecified: Secondary | ICD-10-CM | POA: Diagnosis not present

## 2021-09-15 DIAGNOSIS — N2581 Secondary hyperparathyroidism of renal origin: Secondary | ICD-10-CM | POA: Diagnosis not present

## 2021-09-18 DIAGNOSIS — Z992 Dependence on renal dialysis: Secondary | ICD-10-CM | POA: Diagnosis not present

## 2021-09-18 DIAGNOSIS — L299 Pruritus, unspecified: Secondary | ICD-10-CM | POA: Diagnosis not present

## 2021-09-18 DIAGNOSIS — R52 Pain, unspecified: Secondary | ICD-10-CM | POA: Diagnosis not present

## 2021-09-18 DIAGNOSIS — E1129 Type 2 diabetes mellitus with other diabetic kidney complication: Secondary | ICD-10-CM | POA: Diagnosis not present

## 2021-09-18 DIAGNOSIS — N2581 Secondary hyperparathyroidism of renal origin: Secondary | ICD-10-CM | POA: Diagnosis not present

## 2021-09-18 DIAGNOSIS — N186 End stage renal disease: Secondary | ICD-10-CM | POA: Diagnosis not present

## 2021-09-20 DIAGNOSIS — L299 Pruritus, unspecified: Secondary | ICD-10-CM | POA: Diagnosis not present

## 2021-09-20 DIAGNOSIS — R52 Pain, unspecified: Secondary | ICD-10-CM | POA: Diagnosis not present

## 2021-09-20 DIAGNOSIS — E1129 Type 2 diabetes mellitus with other diabetic kidney complication: Secondary | ICD-10-CM | POA: Diagnosis not present

## 2021-09-20 DIAGNOSIS — N2581 Secondary hyperparathyroidism of renal origin: Secondary | ICD-10-CM | POA: Diagnosis not present

## 2021-09-20 DIAGNOSIS — Z992 Dependence on renal dialysis: Secondary | ICD-10-CM | POA: Diagnosis not present

## 2021-09-20 DIAGNOSIS — N186 End stage renal disease: Secondary | ICD-10-CM | POA: Diagnosis not present

## 2021-09-21 ENCOUNTER — Ambulatory Visit (INDEPENDENT_AMBULATORY_CARE_PROVIDER_SITE_OTHER): Payer: Medicare Other | Admitting: Podiatry

## 2021-09-21 DIAGNOSIS — M79674 Pain in right toe(s): Secondary | ICD-10-CM | POA: Diagnosis not present

## 2021-09-21 DIAGNOSIS — Z992 Dependence on renal dialysis: Secondary | ICD-10-CM | POA: Diagnosis not present

## 2021-09-21 DIAGNOSIS — N186 End stage renal disease: Secondary | ICD-10-CM | POA: Diagnosis not present

## 2021-09-21 DIAGNOSIS — B351 Tinea unguium: Secondary | ICD-10-CM

## 2021-09-21 DIAGNOSIS — E1122 Type 2 diabetes mellitus with diabetic chronic kidney disease: Secondary | ICD-10-CM | POA: Diagnosis not present

## 2021-09-21 DIAGNOSIS — M79675 Pain in left toe(s): Secondary | ICD-10-CM | POA: Diagnosis not present

## 2021-09-22 DIAGNOSIS — E1129 Type 2 diabetes mellitus with other diabetic kidney complication: Secondary | ICD-10-CM | POA: Diagnosis not present

## 2021-09-22 DIAGNOSIS — N186 End stage renal disease: Secondary | ICD-10-CM | POA: Diagnosis not present

## 2021-09-22 DIAGNOSIS — R52 Pain, unspecified: Secondary | ICD-10-CM | POA: Diagnosis not present

## 2021-09-22 DIAGNOSIS — D631 Anemia in chronic kidney disease: Secondary | ICD-10-CM | POA: Diagnosis not present

## 2021-09-22 DIAGNOSIS — Z992 Dependence on renal dialysis: Secondary | ICD-10-CM | POA: Diagnosis not present

## 2021-09-22 DIAGNOSIS — L299 Pruritus, unspecified: Secondary | ICD-10-CM | POA: Diagnosis not present

## 2021-09-22 DIAGNOSIS — N2581 Secondary hyperparathyroidism of renal origin: Secondary | ICD-10-CM | POA: Diagnosis not present

## 2021-09-22 NOTE — Progress Notes (Signed)
Subjective: 66 y.o. returns the office today for painful, elongated, thickened toenails which he cannot trim himself. Denies any redness or drainage around the nails. Denies any systemic complaints such as fevers, chills, nausea, vomiting.   PCP: Merrilee Seashore, MD   Objective: AAO 3, NAD DP/PT pulses palpable, CRT less than 3 seconds Nails hypertrophic, dystrophic, elongated, brittle, discolored 10. There is tenderness overlying the nails 1-5 bilaterally. There is no surrounding erythema or drainage along the nail sites. No open lesions or pre-ulcerative lesions are identified. Arthritic changes present the first MPJ with decreased range of motion.  Gets occasional discomfort in the big toe joint. No pain with calf compression, swelling, warmth, erythema.  Assessment: Patient presents with symptomatic onychomycosis  Plan: -Treatment options including alternatives, risks, complications were discussed -Nails sharply debrided 10 without complication/bleeding. -Discussed different soled shoes to avoid pressure on the MPJ given the arthritis. -Discussed daily foot inspection. If there are any changes, to call the office immediately.  -Follow-up in 3 months or sooner if any problems are to arise. In the meantime, encouraged to call the office with any questions, concerns, changes symptoms.  Celesta Gentile, DPM

## 2021-09-25 DIAGNOSIS — R52 Pain, unspecified: Secondary | ICD-10-CM | POA: Diagnosis not present

## 2021-09-25 DIAGNOSIS — L299 Pruritus, unspecified: Secondary | ICD-10-CM | POA: Diagnosis not present

## 2021-09-25 DIAGNOSIS — N186 End stage renal disease: Secondary | ICD-10-CM | POA: Diagnosis not present

## 2021-09-25 DIAGNOSIS — D631 Anemia in chronic kidney disease: Secondary | ICD-10-CM | POA: Diagnosis not present

## 2021-09-25 DIAGNOSIS — Z992 Dependence on renal dialysis: Secondary | ICD-10-CM | POA: Diagnosis not present

## 2021-09-25 DIAGNOSIS — E1129 Type 2 diabetes mellitus with other diabetic kidney complication: Secondary | ICD-10-CM | POA: Diagnosis not present

## 2021-09-25 DIAGNOSIS — N2581 Secondary hyperparathyroidism of renal origin: Secondary | ICD-10-CM | POA: Diagnosis not present

## 2021-09-27 DIAGNOSIS — L299 Pruritus, unspecified: Secondary | ICD-10-CM | POA: Diagnosis not present

## 2021-09-27 DIAGNOSIS — E1129 Type 2 diabetes mellitus with other diabetic kidney complication: Secondary | ICD-10-CM | POA: Diagnosis not present

## 2021-09-27 DIAGNOSIS — R52 Pain, unspecified: Secondary | ICD-10-CM | POA: Diagnosis not present

## 2021-09-27 DIAGNOSIS — N2581 Secondary hyperparathyroidism of renal origin: Secondary | ICD-10-CM | POA: Diagnosis not present

## 2021-09-27 DIAGNOSIS — N186 End stage renal disease: Secondary | ICD-10-CM | POA: Diagnosis not present

## 2021-09-27 DIAGNOSIS — Z992 Dependence on renal dialysis: Secondary | ICD-10-CM | POA: Diagnosis not present

## 2021-09-27 DIAGNOSIS — D631 Anemia in chronic kidney disease: Secondary | ICD-10-CM | POA: Diagnosis not present

## 2021-09-29 DIAGNOSIS — N2581 Secondary hyperparathyroidism of renal origin: Secondary | ICD-10-CM | POA: Diagnosis not present

## 2021-09-29 DIAGNOSIS — N186 End stage renal disease: Secondary | ICD-10-CM | POA: Diagnosis not present

## 2021-09-29 DIAGNOSIS — D631 Anemia in chronic kidney disease: Secondary | ICD-10-CM | POA: Diagnosis not present

## 2021-09-29 DIAGNOSIS — E1129 Type 2 diabetes mellitus with other diabetic kidney complication: Secondary | ICD-10-CM | POA: Diagnosis not present

## 2021-09-29 DIAGNOSIS — Z992 Dependence on renal dialysis: Secondary | ICD-10-CM | POA: Diagnosis not present

## 2021-09-29 DIAGNOSIS — L299 Pruritus, unspecified: Secondary | ICD-10-CM | POA: Diagnosis not present

## 2021-09-29 DIAGNOSIS — R52 Pain, unspecified: Secondary | ICD-10-CM | POA: Diagnosis not present

## 2021-10-02 DIAGNOSIS — I700 Atherosclerosis of aorta: Secondary | ICD-10-CM | POA: Diagnosis not present

## 2021-10-02 DIAGNOSIS — L299 Pruritus, unspecified: Secondary | ICD-10-CM | POA: Diagnosis not present

## 2021-10-02 DIAGNOSIS — E782 Mixed hyperlipidemia: Secondary | ICD-10-CM | POA: Diagnosis not present

## 2021-10-02 DIAGNOSIS — E103513 Type 1 diabetes mellitus with proliferative diabetic retinopathy with macular edema, bilateral: Secondary | ICD-10-CM | POA: Diagnosis not present

## 2021-10-02 DIAGNOSIS — N186 End stage renal disease: Secondary | ICD-10-CM | POA: Diagnosis not present

## 2021-10-02 DIAGNOSIS — D631 Anemia in chronic kidney disease: Secondary | ICD-10-CM | POA: Diagnosis not present

## 2021-10-02 DIAGNOSIS — I340 Nonrheumatic mitral (valve) insufficiency: Secondary | ICD-10-CM | POA: Diagnosis not present

## 2021-10-02 DIAGNOSIS — Z0000 Encounter for general adult medical examination without abnormal findings: Secondary | ICD-10-CM | POA: Diagnosis not present

## 2021-10-02 DIAGNOSIS — R52 Pain, unspecified: Secondary | ICD-10-CM | POA: Diagnosis not present

## 2021-10-02 DIAGNOSIS — I5022 Chronic systolic (congestive) heart failure: Secondary | ICD-10-CM | POA: Diagnosis not present

## 2021-10-02 DIAGNOSIS — N2581 Secondary hyperparathyroidism of renal origin: Secondary | ICD-10-CM | POA: Diagnosis not present

## 2021-10-02 DIAGNOSIS — Z992 Dependence on renal dialysis: Secondary | ICD-10-CM | POA: Diagnosis not present

## 2021-10-02 DIAGNOSIS — N185 Chronic kidney disease, stage 5: Secondary | ICD-10-CM | POA: Diagnosis not present

## 2021-10-02 DIAGNOSIS — I129 Hypertensive chronic kidney disease with stage 1 through stage 4 chronic kidney disease, or unspecified chronic kidney disease: Secondary | ICD-10-CM | POA: Diagnosis not present

## 2021-10-02 DIAGNOSIS — E1129 Type 2 diabetes mellitus with other diabetic kidney complication: Secondary | ICD-10-CM | POA: Diagnosis not present

## 2021-10-04 DIAGNOSIS — N186 End stage renal disease: Secondary | ICD-10-CM | POA: Diagnosis not present

## 2021-10-04 DIAGNOSIS — E1129 Type 2 diabetes mellitus with other diabetic kidney complication: Secondary | ICD-10-CM | POA: Diagnosis not present

## 2021-10-04 DIAGNOSIS — N2581 Secondary hyperparathyroidism of renal origin: Secondary | ICD-10-CM | POA: Diagnosis not present

## 2021-10-04 DIAGNOSIS — D631 Anemia in chronic kidney disease: Secondary | ICD-10-CM | POA: Diagnosis not present

## 2021-10-04 DIAGNOSIS — Z992 Dependence on renal dialysis: Secondary | ICD-10-CM | POA: Diagnosis not present

## 2021-10-04 DIAGNOSIS — L299 Pruritus, unspecified: Secondary | ICD-10-CM | POA: Diagnosis not present

## 2021-10-04 DIAGNOSIS — R52 Pain, unspecified: Secondary | ICD-10-CM | POA: Diagnosis not present

## 2021-10-06 DIAGNOSIS — N2581 Secondary hyperparathyroidism of renal origin: Secondary | ICD-10-CM | POA: Diagnosis not present

## 2021-10-06 DIAGNOSIS — E1129 Type 2 diabetes mellitus with other diabetic kidney complication: Secondary | ICD-10-CM | POA: Diagnosis not present

## 2021-10-06 DIAGNOSIS — R52 Pain, unspecified: Secondary | ICD-10-CM | POA: Diagnosis not present

## 2021-10-06 DIAGNOSIS — D631 Anemia in chronic kidney disease: Secondary | ICD-10-CM | POA: Diagnosis not present

## 2021-10-06 DIAGNOSIS — L299 Pruritus, unspecified: Secondary | ICD-10-CM | POA: Diagnosis not present

## 2021-10-06 DIAGNOSIS — Z992 Dependence on renal dialysis: Secondary | ICD-10-CM | POA: Diagnosis not present

## 2021-10-06 DIAGNOSIS — N186 End stage renal disease: Secondary | ICD-10-CM | POA: Diagnosis not present

## 2021-10-09 DIAGNOSIS — R52 Pain, unspecified: Secondary | ICD-10-CM | POA: Diagnosis not present

## 2021-10-09 DIAGNOSIS — Z992 Dependence on renal dialysis: Secondary | ICD-10-CM | POA: Diagnosis not present

## 2021-10-09 DIAGNOSIS — D631 Anemia in chronic kidney disease: Secondary | ICD-10-CM | POA: Diagnosis not present

## 2021-10-09 DIAGNOSIS — E1129 Type 2 diabetes mellitus with other diabetic kidney complication: Secondary | ICD-10-CM | POA: Diagnosis not present

## 2021-10-09 DIAGNOSIS — L299 Pruritus, unspecified: Secondary | ICD-10-CM | POA: Diagnosis not present

## 2021-10-09 DIAGNOSIS — N186 End stage renal disease: Secondary | ICD-10-CM | POA: Diagnosis not present

## 2021-10-09 DIAGNOSIS — N2581 Secondary hyperparathyroidism of renal origin: Secondary | ICD-10-CM | POA: Diagnosis not present

## 2021-10-10 DIAGNOSIS — R9439 Abnormal result of other cardiovascular function study: Secondary | ICD-10-CM | POA: Diagnosis not present

## 2021-10-10 DIAGNOSIS — E1122 Type 2 diabetes mellitus with diabetic chronic kidney disease: Secondary | ICD-10-CM | POA: Diagnosis not present

## 2021-10-10 DIAGNOSIS — I120 Hypertensive chronic kidney disease with stage 5 chronic kidney disease or end stage renal disease: Secondary | ICD-10-CM | POA: Diagnosis not present

## 2021-10-10 DIAGNOSIS — I5020 Unspecified systolic (congestive) heart failure: Secondary | ICD-10-CM | POA: Diagnosis not present

## 2021-10-10 DIAGNOSIS — I509 Heart failure, unspecified: Secondary | ICD-10-CM | POA: Diagnosis not present

## 2021-10-11 ENCOUNTER — Other Ambulatory Visit (INDEPENDENT_AMBULATORY_CARE_PROVIDER_SITE_OTHER): Payer: Self-pay

## 2021-10-11 DIAGNOSIS — Z992 Dependence on renal dialysis: Secondary | ICD-10-CM | POA: Diagnosis not present

## 2021-10-11 DIAGNOSIS — E1129 Type 2 diabetes mellitus with other diabetic kidney complication: Secondary | ICD-10-CM | POA: Diagnosis not present

## 2021-10-11 DIAGNOSIS — N186 End stage renal disease: Secondary | ICD-10-CM | POA: Diagnosis not present

## 2021-10-11 DIAGNOSIS — L299 Pruritus, unspecified: Secondary | ICD-10-CM | POA: Diagnosis not present

## 2021-10-11 DIAGNOSIS — D631 Anemia in chronic kidney disease: Secondary | ICD-10-CM | POA: Diagnosis not present

## 2021-10-11 DIAGNOSIS — R52 Pain, unspecified: Secondary | ICD-10-CM | POA: Diagnosis not present

## 2021-10-11 DIAGNOSIS — N2581 Secondary hyperparathyroidism of renal origin: Secondary | ICD-10-CM | POA: Diagnosis not present

## 2021-10-12 NOTE — Progress Notes (Signed)
[facility] Note  10/26/2021     CHIEF COMPLAINT Patient presents for Retina Follow Up  HISTORY OF PRESENT ILLNESS: Jonathan Mcneil is a 66 y.o. male who presents to the clinic today for:   HPI     Retina Follow Up   Patient presents with  Diabetic Retinopathy.  In both eyes.  Severity is moderate.  Duration of 4 months.  Since onset it is stable.  I, the attending physician,  performed the HPI with the patient and updated documentation appropriately.        Comments   Pt here for 80moret f/u PDR OU. Pt went to ER Tuesday for nausea and vomiting, went to dialysis Wed and Thurs. Pt is feeling better, reporting VA seems stable. Sometimes sees periphery FOL, no new floaters reported.       Last edited by ZBernarda Caffey MD on 10/26/2021  1:35 PM.     Pt states vision is stable, he sees occasional fol in the right eye  Referring physician: RMerrilee Seashore MD [address]  [place] [id number] HISTORICAL INFORMATION:   Selected notes from the MMillardReferred by Dr. MMaryjane Mcneil heme OD   CURRENT MEDICATIONS: No current outpatient medications on file. (Ophthalmic Drugs)   No current facility-administered medications for this visit. (Ophthalmic Drugs)   Current Outpatient Medications (Other)  Medication Sig   acetaminophen (TYLENOL) 650 MG CR tablet Take 1,300 mg by mouth daily as needed for pain.   gabapentin (NEURONTIN) 300 MG capsule Take 300 mg by mouth at bedtime.   loperamide (IMODIUM) 2 MG capsule Take 1 capsule (2 mg total) by mouth 4 (four) times daily as needed for diarrhea or loose stools.   ondansetron (ZOFRAN) 4 MG tablet Take 1 tablet (4 mg total) by mouth every 8 (eight) hours as needed for nausea or vomiting.   ondansetron (ZOFRAN-ODT) 4 MG disintegrating tablet Take 1 tablet (4 mg total) by mouth every 8 (eight) hours as needed for nausea or vomiting.   pantoprazole (PROTONIX) 40 MG tablet  Take 1 tablet (40 mg total) by mouth 2 (two) times daily before a meal. Twice a day x 4 weeks, then daily   sertraline (ZOLOFT) 25 MG tablet Take 25 mg by mouth daily as needed (depression).   sucroferric oxyhydroxide (VELPHORO) 500 MG chewable tablet Chew 1,500 mg by mouth 3 (three) times daily with meals.   No current facility-administered medications for this visit. (Other)   REVIEW OF SYSTEMS: ROS   Positive for: Gastrointestinal, Neurological, Genitourinary, Endocrine, Eyes Negative for: Constitutional, Skin, Musculoskeletal, HENT, Cardiovascular, Respiratory, Psychiatric, Allergic/Imm, Heme/Lymph Last edited by SKingsley Spittle COT on 10/26/2021 12:59 PM.     ALLERGIES Allergies  Allergen Reactions   Gabapentin Nausea Only   Ibuprofen Hives   Penicillins Itching and Other (See Comments)    Has patient had a PCN reaction causing immediate rash, facial/tongue/throat swelling, SOB or lightheadedness with hypotension: No Has patient had a PCN reaction causing severe rash involving mucus membranes or skin necrosis: No Has patient had a PCN reaction that required hospitalization No Has patient had a PCN reaction occurring within the last 10 years: Unknown If all of the above answers are "NO", then may proceed with Cephalosporin use.   Venlafaxine Other (See Comments)    insomnia   PAST MEDICAL HISTORY Past Medical History:  Diagnosis Date   Cataract    Diabetes mellitus without complication ([place]  Diabetic retinopathy ([place])    ESRD (end stage renal disease) ([place])    GERD (gastroesophageal reflux disease)    PMH   Hypertension    Hypertensive retinopathy    Low back pain    Lung disease    Metabolic acidosis    Neuromuscular disorder ([place])    peripheral neuropathy   Pancreatitis    Schizophrenia ([place])    does not take medications   Past Surgical History:  Procedure Laterality Date   A/V FISTULAGRAM Left 06/03/2018   Procedure: A/V FISTULAGRAM;  Surgeon: Marty Heck, MD;  Location: [facility];  Service: Cardiovascular;  Laterality: Left;   AV FISTULA PLACEMENT Left 02/11/2018   Procedure: INSERTION OF ARTERIOVENOUS (AV) FISTULA LEFT  ARM;  Surgeon: [name], MD;  Location: [facility];  Service: Vascular;  Laterality: Left;   [place] Left 04/17/2018   Procedure: BASILIC VEIN TRANSPOSITION SECOND STAGE;  Surgeon: [name], MD;  Location: [facility];  Service: Vascular;  Laterality: Left;   BIOPSY  10/20/2019   Procedure: BIOPSY;  Surgeon: Clarene Essex, MD;  Location: [facility];  Service: Endoscopy;;   COLONOSCOPY     DENTAL SURGERY     ESOPHAGOGASTRODUODENOSCOPY N/A 06/16/2021   Procedure: ESOPHAGOGASTRODUODENOSCOPY (EGD);  Surgeon: Wilford Corner, MD;  Location: [facility];  Service: Gastroenterology;  Laterality: N/A;   ESOPHAGOGASTRODUODENOSCOPY (EGD) WITH PROPOFOL N/A 10/20/2019   Procedure: ESOPHAGOGASTRODUODENOSCOPY (EGD) WITH PROPOFOL;  Surgeon: Clarene Essex, MD;  Location: [facility];  Service: Endoscopy;  Laterality: N/A;   INSERTION OF DIALYSIS CATHETER Right 02/25/2018   Procedure: INSERTION OF DIALYSIS CATHETER;  Surgeon: [name], MD;  Location: [facility];  Service: Vascular;  Laterality: Right;   PERIPHERAL VASCULAR BALLOON ANGIOPLASTY  06/03/2018   Procedure: PERIPHERAL VASCULAR BALLOON ANGIOPLASTY;  Surgeon: Marty Heck, MD;  Location: [facility];  Service: Cardiovascular;;  left a/v fistula   FAMILY HISTORY Family History  Problem Relation Age of Onset   Kidney failure Mother    Diabetes Father    Blindness Brother    SOCIAL HISTORY Social History   Tobacco Use   Smoking status: Former   Smokeless tobacco: Never  Scientific laboratory technician Use: Never used  Substance Use Topics   Alcohol use: No   Drug use: Not Currently    Types: Cocaine, Marijuana    Comment: smoked marijuana a few days ago; he denies using cocaine       OPHTHALMIC  EXAM:  Base Eye Exam     Visual Acuity (Snellen - Linear)       Right Left   Dist [place] 20/25 -2 20/20   Dist ph [place] NI          Tonometry (Tonopen, 1:06 PM)       Right Left   Pressure 21 19         Pupils       Dark Light Shape React APD   Right 2 2 Round Minimal None   Left 2 2 Round Minimal None         Visual Fields (Counting fingers)       Left Right    Full Full         Extraocular Movement       Right Left    Full, Ortho Full, Ortho         Neuro/Psych     Oriented x3: Yes   Mood/Affect: Normal  Dilation     Both eyes: 1.0% Mydriacyl, 2.5% Phenylephrine @ 1:07 PM           Slit Lamp and Fundus Exam     Slit Lamp Exam       Right Left   Lids/Lashes Dermatochalasis - upper lid Dermatochalasis - upper lid   Conjunctiva/Sclera Melanosis Melanosis, temporal pinguecula   Cornea trace Punctate epithelial erosions, mild arcus, mild tear film debris trace Punctate epithelial erosions, mild arcus, mild tear film debris   Anterior Chamber Deep and quiet Deep and quiet   Iris Round and dilated, No NVI Round and poorly dilated, No NVI   Lens 2-3+ Nuclear sclerosis, 2-3+ Cortical cataract 2-3+ Nuclear sclerosis, 2-3+ Cortical cataract   Anterior Vitreous Vitreous syneresis, residual white VH settled inferiorly Vitreous syneresis, blood stained vitreous condensations - improved, mild blood clots settling inferiorly and improving, Posterior vitreous detachment         Fundus Exam       Right Left   Disc Mild Pallor, Sharp rim, focal PPP/PPA Mild Pallor, Sharp rim   C/D Ratio 0.4 0.5   Macula Flat, Good foveal reflex, RPE mottling and clumping, no edema, trace MA, mild ERM Flat, good foveal reflex, ERM, RPE mottling and clumping, scattered Microaneurysms/DBH greatest temporal macula   Vessels attenuated, mild Tortuosity, mild Copper wiring, nasal NV improved/regressing mild attenuation, mild Copper wiring, IN NV, Tortuous   Periphery  Attached, 360 IRH/DBH - improved, pre-retinal heme settled inferiorly - improved, 360 PRP with room for posterior fill in Attached, scattered IRH/DBH greatest nasally, good 360 PRP changes, red blood clots inferiorly turning white, No RT/RD           IMAGING AND PROCEDURES  Imaging and Procedures for 10/26/2021  OCT, Retina - OU - Both Eyes       Right Eye Quality was good. Central Foveal Thickness: 261. Progression has improved. Findings include normal foveal contour, no IRF, no SRF, intraretinal hyper-reflective material (Trace vitreous opacities -- improved, irregular lamination, mild diffuse thinning).   Left Eye Quality was good. Central Foveal Thickness: 303. Progression has been stable. Findings include no IRF, abnormal foveal contour, epiretinal membrane, subretinal fluid (Tr persistent vitreous opacities; persistent focal pockets of SRF IN periphery - caught on widefield -- stable from prior, ERM with blunted foveal contour).   Notes *Images captured and stored on drive  Diagnosis / Impression:  OD: Trace vitreous opacities - improved, irregular lamination, mild diffuse thinning OS: Tr persistent vitreous opacities; persistent focal pockets of SRF IN periphery - caught on widefield -- stable from prior, ERM with blunted foveal contour  Clinical management:  See below  Abbreviations: NFP - Normal foveal profile. CME - cystoid macular edema. PED - pigment epithelial detachment. IRF - intraretinal fluid. SRF - subretinal fluid. EZ - ellipsoid zone. ERM - epiretinal membrane. ORA - outer retinal atrophy. ORT - outer retinal tubulation. SRHM - subretinal hyper-reflective material. IRHM - intraretinal hyper-reflective material            ASSESSMENT/PLAN:   ICD-10-CM   1. Proliferative diabetic retinopathy of both eyes with macular edema associated with type 2 diabetes mellitus ([place])  E11.3513 OCT, Retina - OU - Both Eyes    2. Vitreous hemorrhage of left eye (HCC)  H43.12      3. Vitreous hemorrhage, right eye ([place])  H43.11     4. Posterior vitreous detachment of left eye  H43.812     5. Essential hypertension  I10  6. Hypertensive retinopathy of both eyes  H35.033     7. Epiretinal membrane (ERM) of left eye  H35.372     8. Combined forms of age-related cataract of both eyes  H25.813       1-3. Proliferative diabetic retinopathy w/ recurrent VH OU (OD > OS) - s/p IVA OD #1 (12.08.21), #2 (01.11.22), #3 (02.09.22), #4 (11.04.22), #5 (12.02.22), #6 (12.30.22), #7 (01.27.23) - s/p IVA OS #1 (12.10.21), #2 (01.11.22), #3 (02.09.22), #4 (09.09.22), #5 (10.07.22), #6 (11.04.22), #7 (12.02.22), #8 (12.30.22) - FA (12.08.21) shows late-leaking MA, vascular nonperfusion, +NV OU (nasal midzone) - s/p PRP OD (01.26.22) - s/p PRP OS (03.09.22) - BCVA 20/25 OD, 20/20 OS - stable OU - exam shows interval improvement in VH OD; OS with blood stained vit condensations improving from hemorrhagic PVD, and blood clots settled inferiorly  - OCT shows OD: trace vitreous opacities; OS: stable improvement in vitreous opacities; persistent cystic changes temporal macula - seen best on widefield, ERM with blunted foveal contour  - repeat FA 6.6.22 shows interval improvement in NVE/leakage OU s/p PRP OU-- just minimal leakage remains   - recommend holding heparin at dialysis if able--gave note for patient to take to dialysis on 12/23/2020  - recommend holding injections OU today  - pt in agreement - f/u 4-6 months, DFE, OCT, possible injections  4. Hemorrhagic PVD OS  - Onset mid-August 2022 w/ new onset floaters, no photopsias  - s/p IVA OS #4 (09.09.22), #5 (10.07.22), #6 (11.04.22), #7 (12.02.22), #8 (12.30.22) - today, VA stably improved to 20/20 OS from 20/40 and 20/80 prior - pt states they have been holding heparin at dialysis - exam shows interval improvement in vitreous heme -- clearing and settling inferiorly - pt reports previously receiving heparin during  dialysis (T, Th, Sat) -- may have contributed to interval worsening   - Discussed findings and prognosis  - No RT or RD on 360 peripheral exam  - Reviewed s/s of RT/RD  - Strict return precautions for any such RT/RD signs/symptoms  - VH precautions reviewed -- minimize activities, keep head elevated, hold heparin if able, avoid ASA/NSAIDS  5,6. Hypertensive retinopathy OU - discussed importance of tight BP control - monitor  7. Epiretinal membrane, left eye  - The natural history, anatomy, potential for loss of vision, and treatment options including vitrectomy techniques and the complications of endophthalmitis, retinal detachment, vitreous hemorrhage, cataract progression and permanent vision loss discussed with the patient. - mild ERM - BCVA 20/20 - asymptomatic, no metamorphopsia - no indication for surgery at this time - monitor for now - f/u 3 mos -- DFE/OCT  8. Mixed Cataract OU - The symptoms of cataract, surgical options, and treatments and risks were discussed with patient - discussed diagnosis and progression - approaching visual significance - monitor  Ophthalmic Meds Ordered this visit:  No orders of the defined types were placed in this encounter.    Return for f/u 4-6 months, PDR OU, Dilated Exam, OCT.  There are no Patient Instructions on file for this visit.  This document serves as a record of services personally performed by Gardiner Sleeper, MD, PhD. It was created on their behalf by Renaldo Reel, [place] an ophthalmic technician. The creation of this record is the provider's dictation and/or activities during the visit.    Electronically signed by:  Renaldo Reel, COT  10/12/21 2:57 AM  This document serves as a record of services personally performed by Gardiner Sleeper, MD, PhD. It  was created on their behalf by San Jetty. Owens Shark, OA an ophthalmic technician. The creation of this record is the provider's dictation and/or activities during the visit.     Electronically signed by: San Jetty. Owens Shark, [place] 08.04.2023 2:57 AM  Gardiner Sleeper, M.D., Ph.D. Diseases & Surgery of the Retina and Vitreous [facility]  I have reviewed the above documentation for accuracy and completeness, and I agree with the above. Gardiner Sleeper, M.D., Ph.D. 10/27/21 2:59 AM  Abbreviations: M myopia (nearsighted); A astigmatism; H hyperopia (farsighted); P presbyopia; Mrx spectacle prescription;  CTL contact lenses; OD right eye; OS left eye; OU both eyes  XT exotropia; ET esotropia; PEK punctate epithelial keratitis; PEE punctate epithelial erosions; DES dry eye syndrome; MGD meibomian gland dysfunction; ATs artificial tears; PFAT's preservative free artificial tears; [place] nuclear sclerotic cataract; PSC posterior subcapsular cataract; ERM epi-retinal membrane; PVD posterior vitreous detachment; RD retinal detachment; DM diabetes mellitus; DR diabetic retinopathy; NPDR non-proliferative diabetic retinopathy; PDR proliferative diabetic retinopathy; CSME clinically significant macular edema; DME diabetic macular edema; dbh dot blot hemorrhages; CWS cotton wool spot; POAG primary open angle glaucoma; C/D cup-to-disc ratio; HVF humphrey visual field; GVF goldmann visual field; OCT optical coherence tomography; IOP intraocular pressure; BRVO Branch retinal vein occlusion; CRVO central retinal vein occlusion; CRAO central retinal artery occlusion; BRAO branch retinal artery occlusion; RT retinal tear; SB scleral buckle; PPV pars plana vitrectomy; VH Vitreous hemorrhage; PRP panretinal laser photocoagulation; IVK intravitreal kenalog; VMT vitreomacular traction; MH Macular hole;  NVD neovascularization of the disc; NVE neovascularization elsewhere; AREDS age related eye disease study; ARMD age related macular degeneration; POAG primary open angle glaucoma; EBMD epithelial/anterior basement membrane dystrophy; ACIOL anterior chamber intraocular lens; IOL intraocular  lens; PCIOL posterior chamber intraocular lens; Phaco/IOL phacoemulsification with intraocular lens placement; [place] photorefractive keratectomy; LASIK laser assisted in situ keratomileusis; HTN hypertension; DM diabetes mellitus; COPD chronic obstructive pulmonary disease

## 2021-10-13 DIAGNOSIS — N2581 Secondary hyperparathyroidism of renal origin: Secondary | ICD-10-CM | POA: Diagnosis not present

## 2021-10-13 DIAGNOSIS — N186 End stage renal disease: Secondary | ICD-10-CM | POA: Diagnosis not present

## 2021-10-13 DIAGNOSIS — D631 Anemia in chronic kidney disease: Secondary | ICD-10-CM | POA: Diagnosis not present

## 2021-10-13 DIAGNOSIS — E1129 Type 2 diabetes mellitus with other diabetic kidney complication: Secondary | ICD-10-CM | POA: Diagnosis not present

## 2021-10-13 DIAGNOSIS — R52 Pain, unspecified: Secondary | ICD-10-CM | POA: Diagnosis not present

## 2021-10-13 DIAGNOSIS — Z992 Dependence on renal dialysis: Secondary | ICD-10-CM | POA: Diagnosis not present

## 2021-10-13 DIAGNOSIS — L299 Pruritus, unspecified: Secondary | ICD-10-CM | POA: Diagnosis not present

## 2021-10-16 DIAGNOSIS — L299 Pruritus, unspecified: Secondary | ICD-10-CM | POA: Diagnosis not present

## 2021-10-16 DIAGNOSIS — N2581 Secondary hyperparathyroidism of renal origin: Secondary | ICD-10-CM | POA: Diagnosis not present

## 2021-10-16 DIAGNOSIS — N186 End stage renal disease: Secondary | ICD-10-CM | POA: Diagnosis not present

## 2021-10-16 DIAGNOSIS — D631 Anemia in chronic kidney disease: Secondary | ICD-10-CM | POA: Diagnosis not present

## 2021-10-16 DIAGNOSIS — R52 Pain, unspecified: Secondary | ICD-10-CM | POA: Diagnosis not present

## 2021-10-16 DIAGNOSIS — E1129 Type 2 diabetes mellitus with other diabetic kidney complication: Secondary | ICD-10-CM | POA: Diagnosis not present

## 2021-10-16 DIAGNOSIS — Z992 Dependence on renal dialysis: Secondary | ICD-10-CM | POA: Diagnosis not present

## 2021-10-18 DIAGNOSIS — R52 Pain, unspecified: Secondary | ICD-10-CM | POA: Diagnosis not present

## 2021-10-18 DIAGNOSIS — L299 Pruritus, unspecified: Secondary | ICD-10-CM | POA: Diagnosis not present

## 2021-10-18 DIAGNOSIS — D631 Anemia in chronic kidney disease: Secondary | ICD-10-CM | POA: Diagnosis not present

## 2021-10-18 DIAGNOSIS — N186 End stage renal disease: Secondary | ICD-10-CM | POA: Diagnosis not present

## 2021-10-18 DIAGNOSIS — Z992 Dependence on renal dialysis: Secondary | ICD-10-CM | POA: Diagnosis not present

## 2021-10-18 DIAGNOSIS — E1129 Type 2 diabetes mellitus with other diabetic kidney complication: Secondary | ICD-10-CM | POA: Diagnosis not present

## 2021-10-18 DIAGNOSIS — N2581 Secondary hyperparathyroidism of renal origin: Secondary | ICD-10-CM | POA: Diagnosis not present

## 2021-10-20 DIAGNOSIS — D631 Anemia in chronic kidney disease: Secondary | ICD-10-CM | POA: Diagnosis not present

## 2021-10-20 DIAGNOSIS — N186 End stage renal disease: Secondary | ICD-10-CM | POA: Diagnosis not present

## 2021-10-20 DIAGNOSIS — R52 Pain, unspecified: Secondary | ICD-10-CM | POA: Diagnosis not present

## 2021-10-20 DIAGNOSIS — N2581 Secondary hyperparathyroidism of renal origin: Secondary | ICD-10-CM | POA: Diagnosis not present

## 2021-10-20 DIAGNOSIS — E1129 Type 2 diabetes mellitus with other diabetic kidney complication: Secondary | ICD-10-CM | POA: Diagnosis not present

## 2021-10-20 DIAGNOSIS — L299 Pruritus, unspecified: Secondary | ICD-10-CM | POA: Diagnosis not present

## 2021-10-20 DIAGNOSIS — Z992 Dependence on renal dialysis: Secondary | ICD-10-CM | POA: Diagnosis not present

## 2021-10-22 DIAGNOSIS — N186 End stage renal disease: Secondary | ICD-10-CM | POA: Diagnosis not present

## 2021-10-22 DIAGNOSIS — Z992 Dependence on renal dialysis: Secondary | ICD-10-CM | POA: Diagnosis not present

## 2021-10-22 DIAGNOSIS — E1122 Type 2 diabetes mellitus with diabetic chronic kidney disease: Secondary | ICD-10-CM | POA: Diagnosis not present

## 2021-10-23 ENCOUNTER — Other Ambulatory Visit: Payer: Self-pay

## 2021-10-23 ENCOUNTER — Encounter (HOSPITAL_COMMUNITY): Payer: Self-pay

## 2021-10-23 ENCOUNTER — Emergency Department (HOSPITAL_COMMUNITY)
Admission: EM | Admit: 2021-10-23 | Discharge: 2021-10-23 | Disposition: A | Discharge status: Home / Self Care | Payer: Medicare Other | Attending: Emergency Medicine | Admitting: Emergency Medicine

## 2021-10-23 ENCOUNTER — Emergency Department (HOSPITAL_COMMUNITY): Payer: Medicare Other

## 2021-10-23 DIAGNOSIS — R112 Nausea with vomiting, unspecified: Secondary | ICD-10-CM | POA: Insufficient documentation

## 2021-10-23 DIAGNOSIS — E1122 Type 2 diabetes mellitus with diabetic chronic kidney disease: Secondary | ICD-10-CM | POA: Diagnosis not present

## 2021-10-23 DIAGNOSIS — Z87891 Personal history of nicotine dependence: Secondary | ICD-10-CM | POA: Insufficient documentation

## 2021-10-23 DIAGNOSIS — R404 Transient alteration of awareness: Secondary | ICD-10-CM | POA: Diagnosis not present

## 2021-10-23 DIAGNOSIS — N186 End stage renal disease: Secondary | ICD-10-CM | POA: Insufficient documentation

## 2021-10-23 DIAGNOSIS — R109 Unspecified abdominal pain: Secondary | ICD-10-CM | POA: Diagnosis not present

## 2021-10-23 DIAGNOSIS — I120 Hypertensive chronic kidney disease with stage 5 chronic kidney disease or end stage renal disease: Secondary | ICD-10-CM | POA: Diagnosis not present

## 2021-10-23 DIAGNOSIS — R197 Diarrhea, unspecified: Secondary | ICD-10-CM | POA: Diagnosis not present

## 2021-10-23 DIAGNOSIS — E875 Hyperkalemia: Secondary | ICD-10-CM | POA: Insufficient documentation

## 2021-10-23 DIAGNOSIS — R1084 Generalized abdominal pain: Secondary | ICD-10-CM | POA: Diagnosis not present

## 2021-10-23 DIAGNOSIS — I10 Essential (primary) hypertension: Secondary | ICD-10-CM | POA: Diagnosis not present

## 2021-10-23 DIAGNOSIS — Z743 Need for continuous supervision: Secondary | ICD-10-CM | POA: Diagnosis not present

## 2021-10-23 LAB — CBC WITH DIFFERENTIAL/PLATELET
Abs Immature Granulocytes: 0.01 10*3/uL (ref 0.00–0.07)
Basophils Absolute: 0.1 10*3/uL (ref 0.0–0.1)
Basophils Relative: 1 %
Eosinophils Absolute: 0.3 10*3/uL (ref 0.0–0.5)
Eosinophils Relative: 8 %
HCT: 36.6 % — ABNORMAL LOW (ref 39.0–52.0)
Hemoglobin: 12 g/dL — ABNORMAL LOW (ref 13.0–17.0)
Immature Granulocytes: 0 %
Lymphocytes Relative: 24 %
Lymphs Abs: 1 10*3/uL (ref 0.7–4.0)
MCH: 31.6 pg (ref 26.0–34.0)
MCHC: 32.8 g/dL (ref 30.0–36.0)
MCV: 96.3 fL (ref 80.0–100.0)
Monocytes Absolute: 0.6 10*3/uL (ref 0.1–1.0)
Monocytes Relative: 14 %
Neutro Abs: 2.1 10*3/uL (ref 1.7–7.7)
Neutrophils Relative %: 53 %
Platelets: 111 10*3/uL — ABNORMAL LOW (ref 150–400)
RBC: 3.8 MIL/uL — ABNORMAL LOW (ref 4.22–5.81)
RDW: 16 % — ABNORMAL HIGH (ref 11.5–15.5)
WBC: 4.1 10*3/uL (ref 4.0–10.5)
nRBC: 0 % (ref 0.0–0.2)

## 2021-10-23 LAB — COMPREHENSIVE METABOLIC PANEL
ALT: 20 U/L (ref 0–44)
AST: 25 U/L (ref 15–41)
Albumin: 4.3 g/dL (ref 3.5–5.0)
Alkaline Phosphatase: 148 U/L — ABNORMAL HIGH (ref 38–126)
Anion gap: 16 — ABNORMAL HIGH (ref 5–15)
BUN: 50 mg/dL — ABNORMAL HIGH (ref 8–23)
CO2: 24 mmol/L (ref 22–32)
Calcium: 9.9 mg/dL (ref 8.9–10.3)
Chloride: 95 mmol/L — ABNORMAL LOW (ref 98–111)
Creatinine, Ser: 11.23 mg/dL — ABNORMAL HIGH (ref 0.61–1.24)
GFR, Estimated: 5 mL/min — ABNORMAL LOW (ref >60)
Glucose, Bld: 98 mg/dL (ref 70–99)
Potassium: 5.6 mmol/L — ABNORMAL HIGH (ref 3.5–5.1)
Sodium: 135 mmol/L (ref 135–145)
Total Bilirubin: 2.3 mg/dL — ABNORMAL HIGH (ref 0.3–1.2)
Total Protein: 8 g/dL (ref 6.5–8.1)

## 2021-10-23 LAB — POTASSIUM: Potassium: 5.6 mmol/L — ABNORMAL HIGH (ref 3.5–5.1)

## 2021-10-23 LAB — LIPASE, BLOOD: Lipase: 33 U/L (ref 11–51)

## 2021-10-23 MED ORDER — ONDANSETRON 4 MG PO TBDP
4.0000 mg | ORAL_TABLET | Freq: Once | ORAL | Status: AC
  Administered 2021-10-23: 4 mg via ORAL
  Filled 2021-10-23: qty 1

## 2021-10-23 MED ORDER — LOPERAMIDE HCL 2 MG PO CAPS
2.0000 mg | ORAL_CAPSULE | Freq: Once | ORAL | Status: AC
  Administered 2021-10-23: 2 mg via ORAL
  Filled 2021-10-23: qty 1

## 2021-10-23 MED ORDER — IOHEXOL 300 MG/ML  SOLN
100.0000 mL | Freq: Once | INTRAMUSCULAR | Status: AC | PRN
  Administered 2021-10-23: 100 mL via INTRAVENOUS

## 2021-10-23 MED ORDER — LOPERAMIDE HCL 2 MG PO CAPS: 2.0000 mg | ORAL_CAPSULE | Freq: Four times a day (QID) | ORAL | 0 refills | Status: AC | PRN

## 2021-10-23 MED ORDER — SODIUM ZIRCONIUM CYCLOSILICATE 5 G PO PACK
5.0000 g | PACK | Freq: Once | ORAL | Status: AC
  Administered 2021-10-23: 5 g via ORAL
  Filled 2021-10-23: qty 1

## 2021-10-23 MED ORDER — ONDANSETRON 4 MG PO TBDP
4.0000 mg | ORAL_TABLET | Freq: Three times a day (TID) | ORAL | 0 refills | Status: DC | PRN
Start: 2021-10-23 — End: 2023-08-13

## 2021-10-23 MED ORDER — SODIUM CHLORIDE 0.9 % IV BOLUS
250.0000 mL | Freq: Once | INTRAVENOUS | Status: AC
  Administered 2021-10-23: 250 mL via INTRAVENOUS

## 2021-10-23 NOTE — Progress Notes (Signed)
Contacted by nephrologist regarding pt's need for out-pt HD treatment tomorrow at [facility]. Pt missed today's appt. Contacted [facility] and spoke to [employer]. Clinic can treat pt tomorrow.Pt will need to arrive at 5:40 for 6:00 chair time. This information was passed along to nephrologist and ED provider. Arrangements added to AVS. Attempted to reach pt via phone but had to leave a message on a home phone. Requested ED RN to please provide appt info to pt/family at d/c.  Melven Sartorius Renal Navigator [phone]

## 2021-10-23 NOTE — ED Provider Notes (Signed)
Emergency Department Provider Note   I have reviewed the triage vital signs and the nursing notes.   HISTORY  Chief Complaint Diarrhea and Abdominal Pain   HPI Jonathan Mcneil is a 66 y.o. male with past history of end-stage renal disease, hypertension, diabetes presents to the emergency department with diffuse abdominal discomfort along with vomiting and diarrhea.  Symptoms began yesterday.  He is feeling some fatigue and missed dialysis due to symptoms this morning.  His last session was Saturday.  He is not experiencing any chest pain or shortness of breath.  He has not been on antibiotics recently or had travel or known sick contacts.  No recent hospitalization.   Past Medical History:  Diagnosis Date   Cataract    Diabetes mellitus without complication ([place])    Diabetic retinopathy ([place])    ESRD (end stage renal disease) ([place])    GERD (gastroesophageal reflux disease)    PMH   Hypertension    Hypertensive retinopathy    Low back pain    Lung disease    Metabolic acidosis    Neuromuscular disorder ([place])    peripheral neuropathy   Pancreatitis    Schizophrenia ([place])    does not take medications    Review of Systems  Constitutional: No fever/chills. Positive fatigue.  Eyes: No visual changes. ENT: No sore throat. Cardiovascular: Denies chest pain. Respiratory: Denies shortness of breath. Gastrointestinal: Positive abdominal pain.  No nausea, no vomiting. Positive diarrhea.  No constipation. Genitourinary: Negative for dysuria. Musculoskeletal: Negative for back pain. Skin: Negative for rash. Neurological: Negative for headaches, focal weakness or numbness.   ____________________________________________   PHYSICAL EXAM:  VITAL SIGNS: ED Triage Vitals  Enc Vitals Group     BP 10/23/21 0919 138/80     Pulse Rate 10/23/21 0919 74     Resp 10/23/21 0919 16     Temp 10/23/21 0919 98.2 F (36.8 C)     Temp Source 10/23/21 0919 Oral     SpO2 10/23/21  0915 100 %     Weight 10/23/21 0918 201 lb (91.2 kg)     Height 10/23/21 0918 5\' 9"  (1.753 m)   Constitutional: Alert and oriented. Well appearing and in no acute distress. Eyes: Conjunctivae are normal.  Head: Atraumatic. Nose: No congestion/rhinnorhea. Mouth/Throat: Mucous membranes are moist.   Neck: No stridor.  Cardiovascular: Normal rate, regular rhythm. Good peripheral circulation. Grossly normal heart sounds.   Respiratory: Normal respiratory effort.  No retractions. Lungs CTAB. Gastrointestinal: Soft with diffuse mild tenderness. No point tenderness or rebound/guarding. No distention.  Musculoskeletal: No lower extremity tenderness nor edema. No gross deformities of extremities. Neurologic:  Normal speech and language. No gross focal neurologic deficits are appreciated.  Skin:  Skin is warm, dry and intact. No rash noted.  ____________________________________________   LABS (all labs ordered are listed, but only abnormal results are displayed)  Labs Reviewed  CBC WITH DIFFERENTIAL/PLATELET - Abnormal; Notable for the following components:      Result Value   RBC 3.80 (*)    Hemoglobin 12.0 (*)    HCT 36.6 (*)    RDW 16.0 (*)    Platelets 111 (*)    All other components within normal limits  COMPREHENSIVE METABOLIC PANEL - Abnormal; Notable for the following components:   Potassium 5.6 (*)    Chloride 95 (*)    BUN 50 (*)    Creatinine, Ser 11.23 (*)    Alkaline Phosphatase 148 (*)    Total  Bilirubin 2.3 (*)    GFR, Estimated 5 (*)    Anion gap 16 (*)    All other components within normal limits  POTASSIUM - Abnormal; Notable for the following components:   Potassium 5.6 (*)    All other components within normal limits  LIPASE, BLOOD   ____________________________________________  EKG   EKG Interpretation  Date/Time:  Tuesday October 23 2021 13:06:45 EDT Ventricular Rate:  69 PR Interval:  181 QRS Duration: 108 QT Interval:  448 QTC Calculation: 480 R  Axis:   -39 Text Interpretation: Sinus rhythm Left axis deviation Borderline prolonged QT interval Confirmed by Nanda Quinton [phone]) on 10/23/2021 1:19:52 PM        ____________________________________________  RADIOLOGY  CT Abdomen Pelvis W Contrast  Result Date: 10/23/2021 CLINICAL DATA:  Abdominal pain, diarrhea EXAM: CT ABDOMEN AND PELVIS WITH CONTRAST TECHNIQUE: Multidetector CT imaging of the abdomen and pelvis was performed using the standard protocol following bolus administration of intravenous contrast. RADIATION DOSE REDUCTION: This exam was performed according to the departmental dose-optimization program which includes automated exposure control, adjustment of the mA and/or kV according to patient size and/or use of iterative reconstruction technique. CONTRAST:  151mL OMNIPAQUE IOHEXOL 300 MG/ML  SOLN COMPARISON:  06/15/2021 FINDINGS: Lower chest: There is interval clearing of small right pleural effusion. Heart is enlarged in size. Hepatobiliary: There is fatty infiltration liver. Liver measures 21.6 cm in length. There is possible minimal nodularity in liver surface. No focal abnormalities are seen. There is no dilation of bile ducts. Gallbladder is unremarkable. Pancreas: Degenerative changes are noted in lumbar spine and right hip. Spleen: There is 12 mm hypervascular structure in the spleen, possibly hemangioma. Adrenals/Urinary Tract: Adrenals are unremarkable. There is no hydronephrosis. There is cortical thinning. There are no renal or ureteral stones. There is mild diffuse wall thickening in the urinary bladder. This may be partly due to incomplete distention. Stomach/Bowel: Stomach is not distended. Small bowel loops are not dilated. Appendix is difficult visualize. In image 70 of series 7, there is a small caliber tubular structure adjacent to the cecum, most likely normal appendix. There is no significant wall thickening in colon. There is no focal pericolic stranding.  Vascular/Lymphatic: Scattered atherosclerotic plaques and calcifications are seen in aorta and its major branches. There is ectasia of left common iliac artery measuring 1.6 cm. Reproductive: Prostate is enlarged. Other: Small ascites is present. There is no pneumoperitoneum. Umbilical hernia containing fat is seen. Small left inguinal hernia containing fat is seen. Musculoskeletal: Degenerative changes are noted in the lumbar spine, most severe at L2-L3 level. Degenerative changes are noted in right hip. IMPRESSION: There is no evidence of intestinal obstruction or pneumoperitoneum. There is no hydronephrosis. There is cortical thinning in both kidneys suggesting possible medical renal disease. There is mild diffuse wall thickening and urinary bladder which may be due to incomplete distention or suggest chronic bladder outlet obstruction or chronic cystitis. Small ascites is present. Enlarged fatty liver. There is minimal nodularity in liver surface suggesting possible cirrhosis. There is a possible 12 mm hemangioma in the spleen. Other findings as described in the body of the report. Electronically Signed   By: Elmer Picker M.D.   On: 10/23/2021 12:57    ____________________________________________   PROCEDURES  Procedure(s) performed:   Procedures  None  ____________________________________________   INITIAL IMPRESSION / ASSESSMENT AND PLAN / ED COURSE  Pertinent labs & imaging results that were available during my care of the patient were reviewed by me  and considered in my medical decision making (see chart for details).   This patient is Presenting for Evaluation of abdominal pain, which does require a range of treatment options, and is a complaint that involves a high risk of morbidity and mortality.  The Differential Diagnoses includes but is not exclusive to acute cholecystitis, intrathoracic causes for epigastric abdominal pain, gastritis, duodenitis, pancreatitis, small bowel  or large bowel obstruction, abdominal aortic aneurysm, hernia, gastritis, etc.   Critical Interventions-    Medications  iohexol (OMNIPAQUE) 300 MG/ML solution 100 mL (100 mLs Intravenous Contrast Given 10/23/21 1203)  loperamide (IMODIUM) capsule 2 mg (2 mg Oral Given 10/23/21 1351)  ondansetron (ZOFRAN-ODT) disintegrating tablet 4 mg (4 mg Oral Given 10/23/21 1351)  sodium chloride 0.9 % bolus 250 mL (0 mLs Intravenous Stopped 10/23/21 1552)  sodium zirconium cyclosilicate (LOKELMA) packet 5 g (5 g Oral Given 10/23/21 1601)    Reassessment after intervention: Symptoms improved.    I decided to review pertinent External Data, and in summary last admit was March 2023 with [facility].   Clinical Laboratory Tests Ordered, included mild hyperkalemia at 5.6 with some hemolysis noted.  Lipase normal.  Patient does not regularly make urine and this was canceled from triage.  Mild anemia at 12.0 but similar to prior lab work.  No leukocytosis.  Radiologic Tests Ordered, included CT abdomen/pelvis. I independently interpreted the images and agree with radiology interpretation.   Cardiac Monitor Tracing which shows NSR.   Social Determinants of Health Risk patient is a former smoker.   Consult complete with Nephrology. They have coordinated with the patient's HD center and he can have HD tomorrow AM. Will give lokelma dose here in the ED.   Medical Decision Making: Summary:  Patient presents with abdominal discomfort and vomiting/diarrhea.  No acute finding on CT to explain symptoms.  Plan to repeat potassium with slight hemolysis here to ensure patient does not require emergent dialysis.   Reevaluation with update and discussion with patient.  Symptoms are improved after Zofran and Imodium.  Have arranged for him to get dialysis at his center first thing tomorrow morning.  Lokelma given prior to discharge.  Considered admission but able to secure prompt dialysis for the patient along with strict ED return  precautions.  He is feeling improved and tolerating PO at time of d/c.   Disposition: discharge  ____________________________________________  FINAL CLINICAL IMPRESSION(S) / ED DIAGNOSES  Final diagnoses:  Nausea vomiting and diarrhea  Hyperkalemia     NEW OUTPATIENT MEDICATIONS STARTED DURING THIS VISIT:  Discharge Medication List as of 10/23/2021  3:38 PM     START taking these medications   Details  loperamide (IMODIUM) 2 MG capsule Take 1 capsule (2 mg total) by mouth 4 (four) times daily as needed for diarrhea or loose stools., Starting Tue 10/23/2021, Normal    ondansetron (ZOFRAN-ODT) 4 MG disintegrating tablet Take 1 tablet (4 mg total) by mouth every 8 (eight) hours as needed for nausea or vomiting., Starting Tue 10/23/2021, Normal        Note:  This document was prepared using Dragon voice recognition software and may include unintentional dictation errors.  Nanda Quinton, MD, [facility] Emergency Medicine    Sahas Sluka, [facility], MD 10/24/21 [phone]

## 2021-10-23 NOTE — ED Provider Triage Note (Addendum)
Emergency Medicine Provider Triage Evaluation Note  Jonathan Mcneil , a 66 y.o. male  was evaluated in triage.  Pt complains of abdominal pain for the past 1 to 2 days.  Patient states that symptoms began insidiously.  He describes the pain as diffuse and does not radiate to the back.  He notes no alleviating or exacerbating symptoms.  He states he had associated diarrhea since symptom onset that has been persistent.  Denies hematochezia, melena, emesis, fever, chest pain, shortness of breath.  He is a dialysis patient previous dialysis Saturday Tuesday Thursday and did not go to dialysis this morning.  Denies any abdominal surgeries prior.  Patient states he makes very little urine but does not have any urinary symptoms.  Review of Systems  Positive: See above Negative:   Physical Exam  BP 138/80 (BP Location: Right Arm)   Pulse 74   Temp 98.2 F (36.8 C) (Oral)   Resp 16   Ht 5\' 9"  (1.753 m)   Wt 91.2 kg   SpO2 100%   BMI 29.68 kg/m  Gen:   Awake, no distress   Resp:  Normal effort  MSK:   Moves extremities without difficulty  Other:  Diffuse abdominal tenderness on exam.  No CVA tenderness bilaterally.  Medical Decision Making  Medically screening exam initiated at 9:24 AM.  Appropriate orders placed.  Jonathan Mcneil was informed that the remainder of the evaluation will be completed by another provider, this initial triage assessment does not replace that evaluation, and the importance of remaining in the ED until their evaluation is complete.     Wilnette Kales, [place] 10/23/21 [place], [place], [place] 10/23/21 [phone]

## 2021-10-23 NOTE — ED Triage Notes (Addendum)
Pt BIB [facility] from home c/o abdominal pain and diarrhea that started yesterday. Pt is a dialysis pt T,TH,S. Last dialysis was Saturday pt states because of this issue could not go today. Pt denies N/V.

## 2021-10-23 NOTE — Discharge Instructions (Signed)
Your work-up today is reassuring.  I have called in medications for your symptoms.  Your potassium is slightly high and we have arranged for you to have dialysis tomorrow morning.  Please arrive at 5:40 AM for your appointment.  Return with any new or suddenly worsening symptoms.

## 2021-10-24 DIAGNOSIS — R197 Diarrhea, unspecified: Secondary | ICD-10-CM | POA: Diagnosis not present

## 2021-10-24 DIAGNOSIS — N186 End stage renal disease: Secondary | ICD-10-CM | POA: Diagnosis not present

## 2021-10-24 DIAGNOSIS — R52 Pain, unspecified: Secondary | ICD-10-CM | POA: Diagnosis not present

## 2021-10-24 DIAGNOSIS — N2581 Secondary hyperparathyroidism of renal origin: Secondary | ICD-10-CM | POA: Diagnosis not present

## 2021-10-24 DIAGNOSIS — Z992 Dependence on renal dialysis: Secondary | ICD-10-CM | POA: Diagnosis not present

## 2021-10-24 DIAGNOSIS — E1129 Type 2 diabetes mellitus with other diabetic kidney complication: Secondary | ICD-10-CM | POA: Diagnosis not present

## 2021-10-24 DIAGNOSIS — L299 Pruritus, unspecified: Secondary | ICD-10-CM | POA: Diagnosis not present

## 2021-10-24 DIAGNOSIS — D631 Anemia in chronic kidney disease: Secondary | ICD-10-CM | POA: Diagnosis not present

## 2021-10-25 DIAGNOSIS — E1129 Type 2 diabetes mellitus with other diabetic kidney complication: Secondary | ICD-10-CM | POA: Diagnosis not present

## 2021-10-25 DIAGNOSIS — N2581 Secondary hyperparathyroidism of renal origin: Secondary | ICD-10-CM | POA: Diagnosis not present

## 2021-10-25 DIAGNOSIS — R52 Pain, unspecified: Secondary | ICD-10-CM | POA: Diagnosis not present

## 2021-10-25 DIAGNOSIS — Z992 Dependence on renal dialysis: Secondary | ICD-10-CM | POA: Diagnosis not present

## 2021-10-25 DIAGNOSIS — L299 Pruritus, unspecified: Secondary | ICD-10-CM | POA: Diagnosis not present

## 2021-10-25 DIAGNOSIS — N186 End stage renal disease: Secondary | ICD-10-CM | POA: Diagnosis not present

## 2021-10-25 DIAGNOSIS — R197 Diarrhea, unspecified: Secondary | ICD-10-CM | POA: Diagnosis not present

## 2021-10-25 DIAGNOSIS — D631 Anemia in chronic kidney disease: Secondary | ICD-10-CM | POA: Diagnosis not present

## 2021-10-26 ENCOUNTER — Ambulatory Visit (INDEPENDENT_AMBULATORY_CARE_PROVIDER_SITE_OTHER): Payer: Medicare Other | Admitting: Ophthalmology

## 2021-10-26 ENCOUNTER — Encounter (INDEPENDENT_AMBULATORY_CARE_PROVIDER_SITE_OTHER): Payer: Self-pay | Admitting: Ophthalmology

## 2021-10-26 DIAGNOSIS — H35372 Puckering of macula, left eye: Secondary | ICD-10-CM

## 2021-10-26 DIAGNOSIS — H43812 Vitreous degeneration, left eye: Secondary | ICD-10-CM | POA: Diagnosis not present

## 2021-10-26 DIAGNOSIS — H4313 Vitreous hemorrhage, bilateral: Secondary | ICD-10-CM | POA: Diagnosis not present

## 2021-10-26 DIAGNOSIS — H25813 Combined forms of age-related cataract, bilateral: Secondary | ICD-10-CM

## 2021-10-26 DIAGNOSIS — E113513 Type 2 diabetes mellitus with proliferative diabetic retinopathy with macular edema, bilateral: Secondary | ICD-10-CM

## 2021-10-26 DIAGNOSIS — H35033 Hypertensive retinopathy, bilateral: Secondary | ICD-10-CM | POA: Diagnosis not present

## 2021-10-26 DIAGNOSIS — I10 Essential (primary) hypertension: Secondary | ICD-10-CM

## 2021-10-26 DIAGNOSIS — H4311 Vitreous hemorrhage, right eye: Secondary | ICD-10-CM

## 2021-10-26 DIAGNOSIS — H4312 Vitreous hemorrhage, left eye: Secondary | ICD-10-CM

## 2021-10-27 DIAGNOSIS — R52 Pain, unspecified: Secondary | ICD-10-CM | POA: Diagnosis not present

## 2021-10-27 DIAGNOSIS — R197 Diarrhea, unspecified: Secondary | ICD-10-CM | POA: Diagnosis not present

## 2021-10-27 DIAGNOSIS — E1129 Type 2 diabetes mellitus with other diabetic kidney complication: Secondary | ICD-10-CM | POA: Diagnosis not present

## 2021-10-27 DIAGNOSIS — D631 Anemia in chronic kidney disease: Secondary | ICD-10-CM | POA: Diagnosis not present

## 2021-10-27 DIAGNOSIS — N186 End stage renal disease: Secondary | ICD-10-CM | POA: Diagnosis not present

## 2021-10-27 DIAGNOSIS — N2581 Secondary hyperparathyroidism of renal origin: Secondary | ICD-10-CM | POA: Diagnosis not present

## 2021-10-27 DIAGNOSIS — Z992 Dependence on renal dialysis: Secondary | ICD-10-CM | POA: Diagnosis not present

## 2021-10-27 DIAGNOSIS — L299 Pruritus, unspecified: Secondary | ICD-10-CM | POA: Diagnosis not present

## 2021-10-30 DIAGNOSIS — D631 Anemia in chronic kidney disease: Secondary | ICD-10-CM | POA: Diagnosis not present

## 2021-10-30 DIAGNOSIS — N2581 Secondary hyperparathyroidism of renal origin: Secondary | ICD-10-CM | POA: Diagnosis not present

## 2021-10-30 DIAGNOSIS — E1129 Type 2 diabetes mellitus with other diabetic kidney complication: Secondary | ICD-10-CM | POA: Diagnosis not present

## 2021-10-30 DIAGNOSIS — L299 Pruritus, unspecified: Secondary | ICD-10-CM | POA: Diagnosis not present

## 2021-10-30 DIAGNOSIS — N186 End stage renal disease: Secondary | ICD-10-CM | POA: Diagnosis not present

## 2021-10-30 DIAGNOSIS — R52 Pain, unspecified: Secondary | ICD-10-CM | POA: Diagnosis not present

## 2021-10-30 DIAGNOSIS — Z992 Dependence on renal dialysis: Secondary | ICD-10-CM | POA: Diagnosis not present

## 2021-10-30 DIAGNOSIS — R197 Diarrhea, unspecified: Secondary | ICD-10-CM | POA: Diagnosis not present

## 2021-11-01 DIAGNOSIS — R52 Pain, unspecified: Secondary | ICD-10-CM | POA: Diagnosis not present

## 2021-11-01 DIAGNOSIS — D631 Anemia in chronic kidney disease: Secondary | ICD-10-CM | POA: Diagnosis not present

## 2021-11-01 DIAGNOSIS — Z992 Dependence on renal dialysis: Secondary | ICD-10-CM | POA: Diagnosis not present

## 2021-11-01 DIAGNOSIS — L299 Pruritus, unspecified: Secondary | ICD-10-CM | POA: Diagnosis not present

## 2021-11-01 DIAGNOSIS — N186 End stage renal disease: Secondary | ICD-10-CM | POA: Diagnosis not present

## 2021-11-01 DIAGNOSIS — N2581 Secondary hyperparathyroidism of renal origin: Secondary | ICD-10-CM | POA: Diagnosis not present

## 2021-11-01 DIAGNOSIS — R197 Diarrhea, unspecified: Secondary | ICD-10-CM | POA: Diagnosis not present

## 2021-11-01 DIAGNOSIS — E1129 Type 2 diabetes mellitus with other diabetic kidney complication: Secondary | ICD-10-CM | POA: Diagnosis not present

## 2021-11-03 DIAGNOSIS — E1129 Type 2 diabetes mellitus with other diabetic kidney complication: Secondary | ICD-10-CM | POA: Diagnosis not present

## 2021-11-03 DIAGNOSIS — N2581 Secondary hyperparathyroidism of renal origin: Secondary | ICD-10-CM | POA: Diagnosis not present

## 2021-11-03 DIAGNOSIS — N186 End stage renal disease: Secondary | ICD-10-CM | POA: Diagnosis not present

## 2021-11-03 DIAGNOSIS — Z992 Dependence on renal dialysis: Secondary | ICD-10-CM | POA: Diagnosis not present

## 2021-11-03 DIAGNOSIS — R52 Pain, unspecified: Secondary | ICD-10-CM | POA: Diagnosis not present

## 2021-11-03 DIAGNOSIS — L299 Pruritus, unspecified: Secondary | ICD-10-CM | POA: Diagnosis not present

## 2021-11-03 DIAGNOSIS — R197 Diarrhea, unspecified: Secondary | ICD-10-CM | POA: Diagnosis not present

## 2021-11-03 DIAGNOSIS — D631 Anemia in chronic kidney disease: Secondary | ICD-10-CM | POA: Diagnosis not present

## 2021-11-06 DIAGNOSIS — R52 Pain, unspecified: Secondary | ICD-10-CM | POA: Diagnosis not present

## 2021-11-06 DIAGNOSIS — R197 Diarrhea, unspecified: Secondary | ICD-10-CM | POA: Diagnosis not present

## 2021-11-06 DIAGNOSIS — L299 Pruritus, unspecified: Secondary | ICD-10-CM | POA: Diagnosis not present

## 2021-11-06 DIAGNOSIS — N2581 Secondary hyperparathyroidism of renal origin: Secondary | ICD-10-CM | POA: Diagnosis not present

## 2021-11-06 DIAGNOSIS — N186 End stage renal disease: Secondary | ICD-10-CM | POA: Diagnosis not present

## 2021-11-06 DIAGNOSIS — D631 Anemia in chronic kidney disease: Secondary | ICD-10-CM | POA: Diagnosis not present

## 2021-11-06 DIAGNOSIS — E1129 Type 2 diabetes mellitus with other diabetic kidney complication: Secondary | ICD-10-CM | POA: Diagnosis not present

## 2021-11-06 DIAGNOSIS — Z992 Dependence on renal dialysis: Secondary | ICD-10-CM | POA: Diagnosis not present

## 2021-11-08 DIAGNOSIS — D631 Anemia in chronic kidney disease: Secondary | ICD-10-CM | POA: Diagnosis not present

## 2021-11-08 DIAGNOSIS — N186 End stage renal disease: Secondary | ICD-10-CM | POA: Diagnosis not present

## 2021-11-08 DIAGNOSIS — N2581 Secondary hyperparathyroidism of renal origin: Secondary | ICD-10-CM | POA: Diagnosis not present

## 2021-11-08 DIAGNOSIS — R197 Diarrhea, unspecified: Secondary | ICD-10-CM | POA: Diagnosis not present

## 2021-11-08 DIAGNOSIS — E1129 Type 2 diabetes mellitus with other diabetic kidney complication: Secondary | ICD-10-CM | POA: Diagnosis not present

## 2021-11-08 DIAGNOSIS — Z992 Dependence on renal dialysis: Secondary | ICD-10-CM | POA: Diagnosis not present

## 2021-11-08 DIAGNOSIS — R52 Pain, unspecified: Secondary | ICD-10-CM | POA: Diagnosis not present

## 2021-11-08 DIAGNOSIS — L299 Pruritus, unspecified: Secondary | ICD-10-CM | POA: Diagnosis not present

## 2021-11-10 DIAGNOSIS — R52 Pain, unspecified: Secondary | ICD-10-CM | POA: Diagnosis not present

## 2021-11-10 DIAGNOSIS — Z992 Dependence on renal dialysis: Secondary | ICD-10-CM | POA: Diagnosis not present

## 2021-11-10 DIAGNOSIS — L299 Pruritus, unspecified: Secondary | ICD-10-CM | POA: Diagnosis not present

## 2021-11-10 DIAGNOSIS — N186 End stage renal disease: Secondary | ICD-10-CM | POA: Diagnosis not present

## 2021-11-10 DIAGNOSIS — N2581 Secondary hyperparathyroidism of renal origin: Secondary | ICD-10-CM | POA: Diagnosis not present

## 2021-11-10 DIAGNOSIS — R197 Diarrhea, unspecified: Secondary | ICD-10-CM | POA: Diagnosis not present

## 2021-11-10 DIAGNOSIS — D631 Anemia in chronic kidney disease: Secondary | ICD-10-CM | POA: Diagnosis not present

## 2021-11-10 DIAGNOSIS — E1129 Type 2 diabetes mellitus with other diabetic kidney complication: Secondary | ICD-10-CM | POA: Diagnosis not present

## 2021-11-13 DIAGNOSIS — D631 Anemia in chronic kidney disease: Secondary | ICD-10-CM | POA: Diagnosis not present

## 2021-11-13 DIAGNOSIS — E1129 Type 2 diabetes mellitus with other diabetic kidney complication: Secondary | ICD-10-CM | POA: Diagnosis not present

## 2021-11-13 DIAGNOSIS — R197 Diarrhea, unspecified: Secondary | ICD-10-CM | POA: Diagnosis not present

## 2021-11-13 DIAGNOSIS — R52 Pain, unspecified: Secondary | ICD-10-CM | POA: Diagnosis not present

## 2021-11-13 DIAGNOSIS — N186 End stage renal disease: Secondary | ICD-10-CM | POA: Diagnosis not present

## 2021-11-13 DIAGNOSIS — L299 Pruritus, unspecified: Secondary | ICD-10-CM | POA: Diagnosis not present

## 2021-11-13 DIAGNOSIS — Z992 Dependence on renal dialysis: Secondary | ICD-10-CM | POA: Diagnosis not present

## 2021-11-13 DIAGNOSIS — N2581 Secondary hyperparathyroidism of renal origin: Secondary | ICD-10-CM | POA: Diagnosis not present

## 2021-11-15 DIAGNOSIS — N186 End stage renal disease: Secondary | ICD-10-CM | POA: Diagnosis not present

## 2021-11-15 DIAGNOSIS — D631 Anemia in chronic kidney disease: Secondary | ICD-10-CM | POA: Diagnosis not present

## 2021-11-15 DIAGNOSIS — E1129 Type 2 diabetes mellitus with other diabetic kidney complication: Secondary | ICD-10-CM | POA: Diagnosis not present

## 2021-11-15 DIAGNOSIS — Z992 Dependence on renal dialysis: Secondary | ICD-10-CM | POA: Diagnosis not present

## 2021-11-15 DIAGNOSIS — R52 Pain, unspecified: Secondary | ICD-10-CM | POA: Diagnosis not present

## 2021-11-15 DIAGNOSIS — R197 Diarrhea, unspecified: Secondary | ICD-10-CM | POA: Diagnosis not present

## 2021-11-15 DIAGNOSIS — N2581 Secondary hyperparathyroidism of renal origin: Secondary | ICD-10-CM | POA: Diagnosis not present

## 2021-11-15 DIAGNOSIS — L299 Pruritus, unspecified: Secondary | ICD-10-CM | POA: Diagnosis not present

## 2021-11-17 DIAGNOSIS — Z992 Dependence on renal dialysis: Secondary | ICD-10-CM | POA: Diagnosis not present

## 2021-11-17 DIAGNOSIS — R52 Pain, unspecified: Secondary | ICD-10-CM | POA: Diagnosis not present

## 2021-11-17 DIAGNOSIS — E1129 Type 2 diabetes mellitus with other diabetic kidney complication: Secondary | ICD-10-CM | POA: Diagnosis not present

## 2021-11-17 DIAGNOSIS — R197 Diarrhea, unspecified: Secondary | ICD-10-CM | POA: Diagnosis not present

## 2021-11-17 DIAGNOSIS — D631 Anemia in chronic kidney disease: Secondary | ICD-10-CM | POA: Diagnosis not present

## 2021-11-17 DIAGNOSIS — N2581 Secondary hyperparathyroidism of renal origin: Secondary | ICD-10-CM | POA: Diagnosis not present

## 2021-11-17 DIAGNOSIS — N186 End stage renal disease: Secondary | ICD-10-CM | POA: Diagnosis not present

## 2021-11-17 DIAGNOSIS — L299 Pruritus, unspecified: Secondary | ICD-10-CM | POA: Diagnosis not present

## 2021-11-20 DIAGNOSIS — Z992 Dependence on renal dialysis: Secondary | ICD-10-CM | POA: Diagnosis not present

## 2021-11-20 DIAGNOSIS — E1129 Type 2 diabetes mellitus with other diabetic kidney complication: Secondary | ICD-10-CM | POA: Diagnosis not present

## 2021-11-20 DIAGNOSIS — L299 Pruritus, unspecified: Secondary | ICD-10-CM | POA: Diagnosis not present

## 2021-11-20 DIAGNOSIS — N186 End stage renal disease: Secondary | ICD-10-CM | POA: Diagnosis not present

## 2021-11-20 DIAGNOSIS — D631 Anemia in chronic kidney disease: Secondary | ICD-10-CM | POA: Diagnosis not present

## 2021-11-20 DIAGNOSIS — N2581 Secondary hyperparathyroidism of renal origin: Secondary | ICD-10-CM | POA: Diagnosis not present

## 2021-11-20 DIAGNOSIS — R52 Pain, unspecified: Secondary | ICD-10-CM | POA: Diagnosis not present

## 2021-11-20 DIAGNOSIS — R197 Diarrhea, unspecified: Secondary | ICD-10-CM | POA: Diagnosis not present

## 2021-11-22 DIAGNOSIS — L299 Pruritus, unspecified: Secondary | ICD-10-CM | POA: Diagnosis not present

## 2021-11-22 DIAGNOSIS — N2581 Secondary hyperparathyroidism of renal origin: Secondary | ICD-10-CM | POA: Diagnosis not present

## 2021-11-22 DIAGNOSIS — R197 Diarrhea, unspecified: Secondary | ICD-10-CM | POA: Diagnosis not present

## 2021-11-22 DIAGNOSIS — N186 End stage renal disease: Secondary | ICD-10-CM | POA: Diagnosis not present

## 2021-11-22 DIAGNOSIS — R52 Pain, unspecified: Secondary | ICD-10-CM | POA: Diagnosis not present

## 2021-11-22 DIAGNOSIS — D631 Anemia in chronic kidney disease: Secondary | ICD-10-CM | POA: Diagnosis not present

## 2021-11-22 DIAGNOSIS — E1122 Type 2 diabetes mellitus with diabetic chronic kidney disease: Secondary | ICD-10-CM | POA: Diagnosis not present

## 2021-11-22 DIAGNOSIS — E1129 Type 2 diabetes mellitus with other diabetic kidney complication: Secondary | ICD-10-CM | POA: Diagnosis not present

## 2021-11-22 DIAGNOSIS — Z992 Dependence on renal dialysis: Secondary | ICD-10-CM | POA: Diagnosis not present

## 2021-11-24 DIAGNOSIS — N186 End stage renal disease: Secondary | ICD-10-CM | POA: Diagnosis not present

## 2021-11-24 DIAGNOSIS — D631 Anemia in chronic kidney disease: Secondary | ICD-10-CM | POA: Diagnosis not present

## 2021-11-24 DIAGNOSIS — R52 Pain, unspecified: Secondary | ICD-10-CM | POA: Diagnosis not present

## 2021-11-24 DIAGNOSIS — L299 Pruritus, unspecified: Secondary | ICD-10-CM | POA: Diagnosis not present

## 2021-11-24 DIAGNOSIS — Z992 Dependence on renal dialysis: Secondary | ICD-10-CM | POA: Diagnosis not present

## 2021-11-24 DIAGNOSIS — N2581 Secondary hyperparathyroidism of renal origin: Secondary | ICD-10-CM | POA: Diagnosis not present

## 2021-11-24 DIAGNOSIS — E1129 Type 2 diabetes mellitus with other diabetic kidney complication: Secondary | ICD-10-CM | POA: Diagnosis not present

## 2021-11-27 DIAGNOSIS — Z992 Dependence on renal dialysis: Secondary | ICD-10-CM | POA: Diagnosis not present

## 2021-11-27 DIAGNOSIS — N186 End stage renal disease: Secondary | ICD-10-CM | POA: Diagnosis not present

## 2021-11-27 DIAGNOSIS — R52 Pain, unspecified: Secondary | ICD-10-CM | POA: Diagnosis not present

## 2021-11-27 DIAGNOSIS — D631 Anemia in chronic kidney disease: Secondary | ICD-10-CM | POA: Diagnosis not present

## 2021-11-27 DIAGNOSIS — E1129 Type 2 diabetes mellitus with other diabetic kidney complication: Secondary | ICD-10-CM | POA: Diagnosis not present

## 2021-11-27 DIAGNOSIS — N2581 Secondary hyperparathyroidism of renal origin: Secondary | ICD-10-CM | POA: Diagnosis not present

## 2021-11-27 DIAGNOSIS — L299 Pruritus, unspecified: Secondary | ICD-10-CM | POA: Diagnosis not present

## 2021-11-28 DIAGNOSIS — I509 Heart failure, unspecified: Secondary | ICD-10-CM | POA: Diagnosis not present

## 2021-11-28 DIAGNOSIS — I120 Hypertensive chronic kidney disease with stage 5 chronic kidney disease or end stage renal disease: Secondary | ICD-10-CM | POA: Diagnosis not present

## 2021-11-28 DIAGNOSIS — I5020 Unspecified systolic (congestive) heart failure: Secondary | ICD-10-CM | POA: Diagnosis not present

## 2021-11-28 DIAGNOSIS — E1122 Type 2 diabetes mellitus with diabetic chronic kidney disease: Secondary | ICD-10-CM | POA: Diagnosis not present

## 2021-11-28 DIAGNOSIS — N186 End stage renal disease: Secondary | ICD-10-CM | POA: Diagnosis not present

## 2021-11-29 DIAGNOSIS — D631 Anemia in chronic kidney disease: Secondary | ICD-10-CM | POA: Diagnosis not present

## 2021-11-29 DIAGNOSIS — N2581 Secondary hyperparathyroidism of renal origin: Secondary | ICD-10-CM | POA: Diagnosis not present

## 2021-11-29 DIAGNOSIS — E1129 Type 2 diabetes mellitus with other diabetic kidney complication: Secondary | ICD-10-CM | POA: Diagnosis not present

## 2021-11-29 DIAGNOSIS — Z992 Dependence on renal dialysis: Secondary | ICD-10-CM | POA: Diagnosis not present

## 2021-11-29 DIAGNOSIS — L299 Pruritus, unspecified: Secondary | ICD-10-CM | POA: Diagnosis not present

## 2021-11-29 DIAGNOSIS — R52 Pain, unspecified: Secondary | ICD-10-CM | POA: Diagnosis not present

## 2021-11-29 DIAGNOSIS — N186 End stage renal disease: Secondary | ICD-10-CM | POA: Diagnosis not present

## 2021-11-30 ENCOUNTER — Ambulatory Visit (INDEPENDENT_AMBULATORY_CARE_PROVIDER_SITE_OTHER): Payer: Medicare Other | Admitting: Podiatry

## 2021-11-30 DIAGNOSIS — M79674 Pain in right toe(s): Secondary | ICD-10-CM

## 2021-11-30 DIAGNOSIS — E1149 Type 2 diabetes mellitus with other diabetic neurological complication: Secondary | ICD-10-CM

## 2021-11-30 DIAGNOSIS — B351 Tinea unguium: Secondary | ICD-10-CM

## 2021-11-30 DIAGNOSIS — M79675 Pain in left toe(s): Secondary | ICD-10-CM | POA: Diagnosis not present

## 2021-11-30 DIAGNOSIS — M2021 Hallux rigidus, right foot: Secondary | ICD-10-CM

## 2021-11-30 NOTE — Progress Notes (Unsigned)
Subjective: 66 y.o. returns the office today for painful, elongated, thickened toenails which he cannot trim himself. Denies any redness or drainage around the nails. Denies any systemic complaints such as fevers, chills, nausea, vomiting.   PCP: Merrilee Seashore, MD   Objective: AAO 3, NAD DP/PT pulses palpable, CRT less than 3 seconds Nails hypertrophic, dystrophic, elongated, brittle, discolored 10. There is tenderness overlying the nails 1-5 bilaterally. There is no surrounding erythema or drainage along the nail sites. No open lesions or pre-ulcerative lesions are identified. Arthritic changes present the first MPJ with decreased range of motion.  Gets occasional discomfort in the big toe joint. No pain with calf compression, swelling, warmth, erythema.  Assessment: Patient presents with symptomatic onychomycosis  Plan: -Treatment options including alternatives, risks, complications were discussed -Nails sharply debrided 10 without complication/bleeding. -Discussed different soled shoes to avoid pressure on the MPJ given the arthritis. -Discussed daily foot inspection. If there are any changes, to call the office immediately.  -Follow-up in 3 months or sooner if any problems are to arise. In the meantime, encouraged to call the office with any questions, concerns, changes symptoms.  Celesta Gentile, DPM

## 2021-12-01 DIAGNOSIS — N2581 Secondary hyperparathyroidism of renal origin: Secondary | ICD-10-CM | POA: Diagnosis not present

## 2021-12-01 DIAGNOSIS — D631 Anemia in chronic kidney disease: Secondary | ICD-10-CM | POA: Diagnosis not present

## 2021-12-01 DIAGNOSIS — Z992 Dependence on renal dialysis: Secondary | ICD-10-CM | POA: Diagnosis not present

## 2021-12-01 DIAGNOSIS — E1129 Type 2 diabetes mellitus with other diabetic kidney complication: Secondary | ICD-10-CM | POA: Diagnosis not present

## 2021-12-01 DIAGNOSIS — R52 Pain, unspecified: Secondary | ICD-10-CM | POA: Diagnosis not present

## 2021-12-01 DIAGNOSIS — N186 End stage renal disease: Secondary | ICD-10-CM | POA: Diagnosis not present

## 2021-12-01 DIAGNOSIS — L299 Pruritus, unspecified: Secondary | ICD-10-CM | POA: Diagnosis not present

## 2021-12-04 DIAGNOSIS — D631 Anemia in chronic kidney disease: Secondary | ICD-10-CM | POA: Diagnosis not present

## 2021-12-04 DIAGNOSIS — L299 Pruritus, unspecified: Secondary | ICD-10-CM | POA: Diagnosis not present

## 2021-12-04 DIAGNOSIS — E1129 Type 2 diabetes mellitus with other diabetic kidney complication: Secondary | ICD-10-CM | POA: Diagnosis not present

## 2021-12-04 DIAGNOSIS — N2581 Secondary hyperparathyroidism of renal origin: Secondary | ICD-10-CM | POA: Diagnosis not present

## 2021-12-04 DIAGNOSIS — N186 End stage renal disease: Secondary | ICD-10-CM | POA: Diagnosis not present

## 2021-12-04 DIAGNOSIS — R52 Pain, unspecified: Secondary | ICD-10-CM | POA: Diagnosis not present

## 2021-12-04 DIAGNOSIS — Z992 Dependence on renal dialysis: Secondary | ICD-10-CM | POA: Diagnosis not present

## 2021-12-06 DIAGNOSIS — L299 Pruritus, unspecified: Secondary | ICD-10-CM | POA: Diagnosis not present

## 2021-12-06 DIAGNOSIS — Z992 Dependence on renal dialysis: Secondary | ICD-10-CM | POA: Diagnosis not present

## 2021-12-06 DIAGNOSIS — E1129 Type 2 diabetes mellitus with other diabetic kidney complication: Secondary | ICD-10-CM | POA: Diagnosis not present

## 2021-12-06 DIAGNOSIS — D631 Anemia in chronic kidney disease: Secondary | ICD-10-CM | POA: Diagnosis not present

## 2021-12-06 DIAGNOSIS — R52 Pain, unspecified: Secondary | ICD-10-CM | POA: Diagnosis not present

## 2021-12-06 DIAGNOSIS — N186 End stage renal disease: Secondary | ICD-10-CM | POA: Diagnosis not present

## 2021-12-06 DIAGNOSIS — N2581 Secondary hyperparathyroidism of renal origin: Secondary | ICD-10-CM | POA: Diagnosis not present

## 2021-12-08 DIAGNOSIS — N186 End stage renal disease: Secondary | ICD-10-CM | POA: Diagnosis not present

## 2021-12-08 DIAGNOSIS — Z992 Dependence on renal dialysis: Secondary | ICD-10-CM | POA: Diagnosis not present

## 2021-12-08 DIAGNOSIS — L299 Pruritus, unspecified: Secondary | ICD-10-CM | POA: Diagnosis not present

## 2021-12-08 DIAGNOSIS — R52 Pain, unspecified: Secondary | ICD-10-CM | POA: Diagnosis not present

## 2021-12-08 DIAGNOSIS — D631 Anemia in chronic kidney disease: Secondary | ICD-10-CM | POA: Diagnosis not present

## 2021-12-08 DIAGNOSIS — E1129 Type 2 diabetes mellitus with other diabetic kidney complication: Secondary | ICD-10-CM | POA: Diagnosis not present

## 2021-12-08 DIAGNOSIS — N2581 Secondary hyperparathyroidism of renal origin: Secondary | ICD-10-CM | POA: Diagnosis not present

## 2021-12-11 DIAGNOSIS — D631 Anemia in chronic kidney disease: Secondary | ICD-10-CM | POA: Diagnosis not present

## 2021-12-11 DIAGNOSIS — E1129 Type 2 diabetes mellitus with other diabetic kidney complication: Secondary | ICD-10-CM | POA: Diagnosis not present

## 2021-12-11 DIAGNOSIS — N186 End stage renal disease: Secondary | ICD-10-CM | POA: Diagnosis not present

## 2021-12-11 DIAGNOSIS — R52 Pain, unspecified: Secondary | ICD-10-CM | POA: Diagnosis not present

## 2021-12-11 DIAGNOSIS — N2581 Secondary hyperparathyroidism of renal origin: Secondary | ICD-10-CM | POA: Diagnosis not present

## 2021-12-11 DIAGNOSIS — Z992 Dependence on renal dialysis: Secondary | ICD-10-CM | POA: Diagnosis not present

## 2021-12-11 DIAGNOSIS — L299 Pruritus, unspecified: Secondary | ICD-10-CM | POA: Diagnosis not present

## 2021-12-13 DIAGNOSIS — L299 Pruritus, unspecified: Secondary | ICD-10-CM | POA: Diagnosis not present

## 2021-12-13 DIAGNOSIS — N186 End stage renal disease: Secondary | ICD-10-CM | POA: Diagnosis not present

## 2021-12-13 DIAGNOSIS — E1129 Type 2 diabetes mellitus with other diabetic kidney complication: Secondary | ICD-10-CM | POA: Diagnosis not present

## 2021-12-13 DIAGNOSIS — N2581 Secondary hyperparathyroidism of renal origin: Secondary | ICD-10-CM | POA: Diagnosis not present

## 2021-12-13 DIAGNOSIS — D631 Anemia in chronic kidney disease: Secondary | ICD-10-CM | POA: Diagnosis not present

## 2021-12-13 DIAGNOSIS — R52 Pain, unspecified: Secondary | ICD-10-CM | POA: Diagnosis not present

## 2021-12-13 DIAGNOSIS — Z992 Dependence on renal dialysis: Secondary | ICD-10-CM | POA: Diagnosis not present

## 2021-12-15 DIAGNOSIS — R52 Pain, unspecified: Secondary | ICD-10-CM | POA: Diagnosis not present

## 2021-12-15 DIAGNOSIS — L299 Pruritus, unspecified: Secondary | ICD-10-CM | POA: Diagnosis not present

## 2021-12-15 DIAGNOSIS — Z992 Dependence on renal dialysis: Secondary | ICD-10-CM | POA: Diagnosis not present

## 2021-12-15 DIAGNOSIS — N2581 Secondary hyperparathyroidism of renal origin: Secondary | ICD-10-CM | POA: Diagnosis not present

## 2021-12-15 DIAGNOSIS — N186 End stage renal disease: Secondary | ICD-10-CM | POA: Diagnosis not present

## 2021-12-15 DIAGNOSIS — D631 Anemia in chronic kidney disease: Secondary | ICD-10-CM | POA: Diagnosis not present

## 2021-12-15 DIAGNOSIS — E1129 Type 2 diabetes mellitus with other diabetic kidney complication: Secondary | ICD-10-CM | POA: Diagnosis not present

## 2021-12-18 DIAGNOSIS — N2581 Secondary hyperparathyroidism of renal origin: Secondary | ICD-10-CM | POA: Diagnosis not present

## 2021-12-18 DIAGNOSIS — N186 End stage renal disease: Secondary | ICD-10-CM | POA: Diagnosis not present

## 2021-12-18 DIAGNOSIS — Z992 Dependence on renal dialysis: Secondary | ICD-10-CM | POA: Diagnosis not present

## 2021-12-18 DIAGNOSIS — D631 Anemia in chronic kidney disease: Secondary | ICD-10-CM | POA: Diagnosis not present

## 2021-12-18 DIAGNOSIS — R52 Pain, unspecified: Secondary | ICD-10-CM | POA: Diagnosis not present

## 2021-12-18 DIAGNOSIS — L299 Pruritus, unspecified: Secondary | ICD-10-CM | POA: Diagnosis not present

## 2021-12-18 DIAGNOSIS — E1129 Type 2 diabetes mellitus with other diabetic kidney complication: Secondary | ICD-10-CM | POA: Diagnosis not present

## 2021-12-20 ENCOUNTER — Other Ambulatory Visit: Payer: Medicare Other

## 2021-12-20 DIAGNOSIS — E1129 Type 2 diabetes mellitus with other diabetic kidney complication: Secondary | ICD-10-CM | POA: Diagnosis not present

## 2021-12-20 DIAGNOSIS — L299 Pruritus, unspecified: Secondary | ICD-10-CM | POA: Diagnosis not present

## 2021-12-20 DIAGNOSIS — N2581 Secondary hyperparathyroidism of renal origin: Secondary | ICD-10-CM | POA: Diagnosis not present

## 2021-12-20 DIAGNOSIS — Z992 Dependence on renal dialysis: Secondary | ICD-10-CM | POA: Diagnosis not present

## 2021-12-20 DIAGNOSIS — R52 Pain, unspecified: Secondary | ICD-10-CM | POA: Diagnosis not present

## 2021-12-20 DIAGNOSIS — N186 End stage renal disease: Secondary | ICD-10-CM | POA: Diagnosis not present

## 2021-12-20 DIAGNOSIS — D631 Anemia in chronic kidney disease: Secondary | ICD-10-CM | POA: Diagnosis not present

## 2021-12-22 DIAGNOSIS — D631 Anemia in chronic kidney disease: Secondary | ICD-10-CM | POA: Diagnosis not present

## 2021-12-22 DIAGNOSIS — R52 Pain, unspecified: Secondary | ICD-10-CM | POA: Diagnosis not present

## 2021-12-22 DIAGNOSIS — E1122 Type 2 diabetes mellitus with diabetic chronic kidney disease: Secondary | ICD-10-CM | POA: Diagnosis not present

## 2021-12-22 DIAGNOSIS — E1129 Type 2 diabetes mellitus with other diabetic kidney complication: Secondary | ICD-10-CM | POA: Diagnosis not present

## 2021-12-22 DIAGNOSIS — L299 Pruritus, unspecified: Secondary | ICD-10-CM | POA: Diagnosis not present

## 2021-12-22 DIAGNOSIS — N2581 Secondary hyperparathyroidism of renal origin: Secondary | ICD-10-CM | POA: Diagnosis not present

## 2021-12-22 DIAGNOSIS — Z992 Dependence on renal dialysis: Secondary | ICD-10-CM | POA: Diagnosis not present

## 2021-12-22 DIAGNOSIS — N186 End stage renal disease: Secondary | ICD-10-CM | POA: Diagnosis not present

## 2021-12-24 ENCOUNTER — Ambulatory Visit: Payer: Medicare Other | Admitting: Podiatry

## 2021-12-25 DIAGNOSIS — D631 Anemia in chronic kidney disease: Secondary | ICD-10-CM | POA: Diagnosis not present

## 2021-12-25 DIAGNOSIS — L299 Pruritus, unspecified: Secondary | ICD-10-CM | POA: Diagnosis not present

## 2021-12-25 DIAGNOSIS — Z992 Dependence on renal dialysis: Secondary | ICD-10-CM | POA: Diagnosis not present

## 2021-12-25 DIAGNOSIS — E1129 Type 2 diabetes mellitus with other diabetic kidney complication: Secondary | ICD-10-CM | POA: Diagnosis not present

## 2021-12-25 DIAGNOSIS — N186 End stage renal disease: Secondary | ICD-10-CM | POA: Diagnosis not present

## 2021-12-25 DIAGNOSIS — N2581 Secondary hyperparathyroidism of renal origin: Secondary | ICD-10-CM | POA: Diagnosis not present

## 2021-12-25 DIAGNOSIS — R52 Pain, unspecified: Secondary | ICD-10-CM | POA: Diagnosis not present

## 2021-12-27 DIAGNOSIS — N2581 Secondary hyperparathyroidism of renal origin: Secondary | ICD-10-CM | POA: Diagnosis not present

## 2021-12-27 DIAGNOSIS — R52 Pain, unspecified: Secondary | ICD-10-CM | POA: Diagnosis not present

## 2021-12-27 DIAGNOSIS — L299 Pruritus, unspecified: Secondary | ICD-10-CM | POA: Diagnosis not present

## 2021-12-27 DIAGNOSIS — D631 Anemia in chronic kidney disease: Secondary | ICD-10-CM | POA: Diagnosis not present

## 2021-12-27 DIAGNOSIS — E1129 Type 2 diabetes mellitus with other diabetic kidney complication: Secondary | ICD-10-CM | POA: Diagnosis not present

## 2021-12-27 DIAGNOSIS — Z992 Dependence on renal dialysis: Secondary | ICD-10-CM | POA: Diagnosis not present

## 2021-12-27 DIAGNOSIS — N186 End stage renal disease: Secondary | ICD-10-CM | POA: Diagnosis not present

## 2021-12-29 DIAGNOSIS — D631 Anemia in chronic kidney disease: Secondary | ICD-10-CM | POA: Diagnosis not present

## 2021-12-29 DIAGNOSIS — L299 Pruritus, unspecified: Secondary | ICD-10-CM | POA: Diagnosis not present

## 2021-12-29 DIAGNOSIS — Z992 Dependence on renal dialysis: Secondary | ICD-10-CM | POA: Diagnosis not present

## 2021-12-29 DIAGNOSIS — N2581 Secondary hyperparathyroidism of renal origin: Secondary | ICD-10-CM | POA: Diagnosis not present

## 2021-12-29 DIAGNOSIS — E1129 Type 2 diabetes mellitus with other diabetic kidney complication: Secondary | ICD-10-CM | POA: Diagnosis not present

## 2021-12-29 DIAGNOSIS — R52 Pain, unspecified: Secondary | ICD-10-CM | POA: Diagnosis not present

## 2021-12-29 DIAGNOSIS — N186 End stage renal disease: Secondary | ICD-10-CM | POA: Diagnosis not present

## 2022-01-01 DIAGNOSIS — D631 Anemia in chronic kidney disease: Secondary | ICD-10-CM | POA: Diagnosis not present

## 2022-01-01 DIAGNOSIS — E1129 Type 2 diabetes mellitus with other diabetic kidney complication: Secondary | ICD-10-CM | POA: Diagnosis not present

## 2022-01-01 DIAGNOSIS — R52 Pain, unspecified: Secondary | ICD-10-CM | POA: Diagnosis not present

## 2022-01-01 DIAGNOSIS — Z992 Dependence on renal dialysis: Secondary | ICD-10-CM | POA: Diagnosis not present

## 2022-01-01 DIAGNOSIS — N2581 Secondary hyperparathyroidism of renal origin: Secondary | ICD-10-CM | POA: Diagnosis not present

## 2022-01-01 DIAGNOSIS — L299 Pruritus, unspecified: Secondary | ICD-10-CM | POA: Diagnosis not present

## 2022-01-01 DIAGNOSIS — N186 End stage renal disease: Secondary | ICD-10-CM | POA: Diagnosis not present

## 2022-01-03 DIAGNOSIS — L299 Pruritus, unspecified: Secondary | ICD-10-CM | POA: Diagnosis not present

## 2022-01-03 DIAGNOSIS — Z992 Dependence on renal dialysis: Secondary | ICD-10-CM | POA: Diagnosis not present

## 2022-01-03 DIAGNOSIS — N186 End stage renal disease: Secondary | ICD-10-CM | POA: Diagnosis not present

## 2022-01-03 DIAGNOSIS — N2581 Secondary hyperparathyroidism of renal origin: Secondary | ICD-10-CM | POA: Diagnosis not present

## 2022-01-03 DIAGNOSIS — D631 Anemia in chronic kidney disease: Secondary | ICD-10-CM | POA: Diagnosis not present

## 2022-01-03 DIAGNOSIS — E1129 Type 2 diabetes mellitus with other diabetic kidney complication: Secondary | ICD-10-CM | POA: Diagnosis not present

## 2022-01-03 DIAGNOSIS — R52 Pain, unspecified: Secondary | ICD-10-CM | POA: Diagnosis not present

## 2022-01-05 DIAGNOSIS — Z992 Dependence on renal dialysis: Secondary | ICD-10-CM | POA: Diagnosis not present

## 2022-01-05 DIAGNOSIS — N2581 Secondary hyperparathyroidism of renal origin: Secondary | ICD-10-CM | POA: Diagnosis not present

## 2022-01-05 DIAGNOSIS — D631 Anemia in chronic kidney disease: Secondary | ICD-10-CM | POA: Diagnosis not present

## 2022-01-05 DIAGNOSIS — E1129 Type 2 diabetes mellitus with other diabetic kidney complication: Secondary | ICD-10-CM | POA: Diagnosis not present

## 2022-01-05 DIAGNOSIS — L299 Pruritus, unspecified: Secondary | ICD-10-CM | POA: Diagnosis not present

## 2022-01-05 DIAGNOSIS — R52 Pain, unspecified: Secondary | ICD-10-CM | POA: Diagnosis not present

## 2022-01-05 DIAGNOSIS — N186 End stage renal disease: Secondary | ICD-10-CM | POA: Diagnosis not present

## 2022-01-08 DIAGNOSIS — D631 Anemia in chronic kidney disease: Secondary | ICD-10-CM | POA: Diagnosis not present

## 2022-01-08 DIAGNOSIS — L299 Pruritus, unspecified: Secondary | ICD-10-CM | POA: Diagnosis not present

## 2022-01-08 DIAGNOSIS — N186 End stage renal disease: Secondary | ICD-10-CM | POA: Diagnosis not present

## 2022-01-08 DIAGNOSIS — E1129 Type 2 diabetes mellitus with other diabetic kidney complication: Secondary | ICD-10-CM | POA: Diagnosis not present

## 2022-01-08 DIAGNOSIS — N2581 Secondary hyperparathyroidism of renal origin: Secondary | ICD-10-CM | POA: Diagnosis not present

## 2022-01-08 DIAGNOSIS — Z992 Dependence on renal dialysis: Secondary | ICD-10-CM | POA: Diagnosis not present

## 2022-01-08 DIAGNOSIS — R52 Pain, unspecified: Secondary | ICD-10-CM | POA: Diagnosis not present

## 2022-01-10 DIAGNOSIS — E1129 Type 2 diabetes mellitus with other diabetic kidney complication: Secondary | ICD-10-CM | POA: Diagnosis not present

## 2022-01-10 DIAGNOSIS — R52 Pain, unspecified: Secondary | ICD-10-CM | POA: Diagnosis not present

## 2022-01-10 DIAGNOSIS — N2581 Secondary hyperparathyroidism of renal origin: Secondary | ICD-10-CM | POA: Diagnosis not present

## 2022-01-10 DIAGNOSIS — L299 Pruritus, unspecified: Secondary | ICD-10-CM | POA: Diagnosis not present

## 2022-01-10 DIAGNOSIS — N186 End stage renal disease: Secondary | ICD-10-CM | POA: Diagnosis not present

## 2022-01-10 DIAGNOSIS — D631 Anemia in chronic kidney disease: Secondary | ICD-10-CM | POA: Diagnosis not present

## 2022-01-10 DIAGNOSIS — Z992 Dependence on renal dialysis: Secondary | ICD-10-CM | POA: Diagnosis not present

## 2022-01-12 DIAGNOSIS — D631 Anemia in chronic kidney disease: Secondary | ICD-10-CM | POA: Diagnosis not present

## 2022-01-12 DIAGNOSIS — N186 End stage renal disease: Secondary | ICD-10-CM | POA: Diagnosis not present

## 2022-01-12 DIAGNOSIS — E1129 Type 2 diabetes mellitus with other diabetic kidney complication: Secondary | ICD-10-CM | POA: Diagnosis not present

## 2022-01-12 DIAGNOSIS — N2581 Secondary hyperparathyroidism of renal origin: Secondary | ICD-10-CM | POA: Diagnosis not present

## 2022-01-12 DIAGNOSIS — R52 Pain, unspecified: Secondary | ICD-10-CM | POA: Diagnosis not present

## 2022-01-12 DIAGNOSIS — L299 Pruritus, unspecified: Secondary | ICD-10-CM | POA: Diagnosis not present

## 2022-01-12 DIAGNOSIS — Z992 Dependence on renal dialysis: Secondary | ICD-10-CM | POA: Diagnosis not present

## 2022-01-15 DIAGNOSIS — E1129 Type 2 diabetes mellitus with other diabetic kidney complication: Secondary | ICD-10-CM | POA: Diagnosis not present

## 2022-01-15 DIAGNOSIS — L299 Pruritus, unspecified: Secondary | ICD-10-CM | POA: Diagnosis not present

## 2022-01-15 DIAGNOSIS — D631 Anemia in chronic kidney disease: Secondary | ICD-10-CM | POA: Diagnosis not present

## 2022-01-15 DIAGNOSIS — R52 Pain, unspecified: Secondary | ICD-10-CM | POA: Diagnosis not present

## 2022-01-15 DIAGNOSIS — N186 End stage renal disease: Secondary | ICD-10-CM | POA: Diagnosis not present

## 2022-01-15 DIAGNOSIS — Z992 Dependence on renal dialysis: Secondary | ICD-10-CM | POA: Diagnosis not present

## 2022-01-15 DIAGNOSIS — N2581 Secondary hyperparathyroidism of renal origin: Secondary | ICD-10-CM | POA: Diagnosis not present

## 2022-01-17 DIAGNOSIS — Z992 Dependence on renal dialysis: Secondary | ICD-10-CM | POA: Diagnosis not present

## 2022-01-17 DIAGNOSIS — E1129 Type 2 diabetes mellitus with other diabetic kidney complication: Secondary | ICD-10-CM | POA: Diagnosis not present

## 2022-01-17 DIAGNOSIS — R52 Pain, unspecified: Secondary | ICD-10-CM | POA: Diagnosis not present

## 2022-01-17 DIAGNOSIS — D631 Anemia in chronic kidney disease: Secondary | ICD-10-CM | POA: Diagnosis not present

## 2022-01-17 DIAGNOSIS — N186 End stage renal disease: Secondary | ICD-10-CM | POA: Diagnosis not present

## 2022-01-17 DIAGNOSIS — N2581 Secondary hyperparathyroidism of renal origin: Secondary | ICD-10-CM | POA: Diagnosis not present

## 2022-01-17 DIAGNOSIS — L299 Pruritus, unspecified: Secondary | ICD-10-CM | POA: Diagnosis not present

## 2022-01-19 DIAGNOSIS — L299 Pruritus, unspecified: Secondary | ICD-10-CM | POA: Diagnosis not present

## 2022-01-19 DIAGNOSIS — E1129 Type 2 diabetes mellitus with other diabetic kidney complication: Secondary | ICD-10-CM | POA: Diagnosis not present

## 2022-01-19 DIAGNOSIS — N2581 Secondary hyperparathyroidism of renal origin: Secondary | ICD-10-CM | POA: Diagnosis not present

## 2022-01-19 DIAGNOSIS — Z992 Dependence on renal dialysis: Secondary | ICD-10-CM | POA: Diagnosis not present

## 2022-01-19 DIAGNOSIS — R52 Pain, unspecified: Secondary | ICD-10-CM | POA: Diagnosis not present

## 2022-01-19 DIAGNOSIS — D631 Anemia in chronic kidney disease: Secondary | ICD-10-CM | POA: Diagnosis not present

## 2022-01-19 DIAGNOSIS — N186 End stage renal disease: Secondary | ICD-10-CM | POA: Diagnosis not present

## 2022-01-21 DIAGNOSIS — I871 Compression of vein: Secondary | ICD-10-CM | POA: Diagnosis not present

## 2022-01-21 DIAGNOSIS — Z992 Dependence on renal dialysis: Secondary | ICD-10-CM | POA: Diagnosis not present

## 2022-01-21 DIAGNOSIS — N186 End stage renal disease: Secondary | ICD-10-CM | POA: Diagnosis not present

## 2022-01-22 DIAGNOSIS — Z992 Dependence on renal dialysis: Secondary | ICD-10-CM | POA: Diagnosis not present

## 2022-01-22 DIAGNOSIS — N2581 Secondary hyperparathyroidism of renal origin: Secondary | ICD-10-CM | POA: Diagnosis not present

## 2022-01-22 DIAGNOSIS — R52 Pain, unspecified: Secondary | ICD-10-CM | POA: Diagnosis not present

## 2022-01-22 DIAGNOSIS — L299 Pruritus, unspecified: Secondary | ICD-10-CM | POA: Diagnosis not present

## 2022-01-22 DIAGNOSIS — E1122 Type 2 diabetes mellitus with diabetic chronic kidney disease: Secondary | ICD-10-CM | POA: Diagnosis not present

## 2022-01-22 DIAGNOSIS — N186 End stage renal disease: Secondary | ICD-10-CM | POA: Diagnosis not present

## 2022-01-22 DIAGNOSIS — D631 Anemia in chronic kidney disease: Secondary | ICD-10-CM | POA: Diagnosis not present

## 2022-01-22 DIAGNOSIS — E1129 Type 2 diabetes mellitus with other diabetic kidney complication: Secondary | ICD-10-CM | POA: Diagnosis not present

## 2022-01-24 DIAGNOSIS — R52 Pain, unspecified: Secondary | ICD-10-CM | POA: Diagnosis not present

## 2022-01-24 DIAGNOSIS — N186 End stage renal disease: Secondary | ICD-10-CM | POA: Diagnosis not present

## 2022-01-24 DIAGNOSIS — E1129 Type 2 diabetes mellitus with other diabetic kidney complication: Secondary | ICD-10-CM | POA: Diagnosis not present

## 2022-01-24 DIAGNOSIS — Z992 Dependence on renal dialysis: Secondary | ICD-10-CM | POA: Diagnosis not present

## 2022-01-24 DIAGNOSIS — N2581 Secondary hyperparathyroidism of renal origin: Secondary | ICD-10-CM | POA: Diagnosis not present

## 2022-01-24 DIAGNOSIS — L299 Pruritus, unspecified: Secondary | ICD-10-CM | POA: Diagnosis not present

## 2022-01-26 DIAGNOSIS — L299 Pruritus, unspecified: Secondary | ICD-10-CM | POA: Diagnosis not present

## 2022-01-26 DIAGNOSIS — E1129 Type 2 diabetes mellitus with other diabetic kidney complication: Secondary | ICD-10-CM | POA: Diagnosis not present

## 2022-01-26 DIAGNOSIS — Z992 Dependence on renal dialysis: Secondary | ICD-10-CM | POA: Diagnosis not present

## 2022-01-26 DIAGNOSIS — N2581 Secondary hyperparathyroidism of renal origin: Secondary | ICD-10-CM | POA: Diagnosis not present

## 2022-01-26 DIAGNOSIS — R52 Pain, unspecified: Secondary | ICD-10-CM | POA: Diagnosis not present

## 2022-01-26 DIAGNOSIS — N186 End stage renal disease: Secondary | ICD-10-CM | POA: Diagnosis not present

## 2022-01-29 DIAGNOSIS — E1129 Type 2 diabetes mellitus with other diabetic kidney complication: Secondary | ICD-10-CM | POA: Diagnosis not present

## 2022-01-29 DIAGNOSIS — R52 Pain, unspecified: Secondary | ICD-10-CM | POA: Diagnosis not present

## 2022-01-29 DIAGNOSIS — Z992 Dependence on renal dialysis: Secondary | ICD-10-CM | POA: Diagnosis not present

## 2022-01-29 DIAGNOSIS — N186 End stage renal disease: Secondary | ICD-10-CM | POA: Diagnosis not present

## 2022-01-29 DIAGNOSIS — N2581 Secondary hyperparathyroidism of renal origin: Secondary | ICD-10-CM | POA: Diagnosis not present

## 2022-01-29 DIAGNOSIS — L299 Pruritus, unspecified: Secondary | ICD-10-CM | POA: Diagnosis not present

## 2022-01-31 DIAGNOSIS — R52 Pain, unspecified: Secondary | ICD-10-CM | POA: Diagnosis not present

## 2022-01-31 DIAGNOSIS — N2581 Secondary hyperparathyroidism of renal origin: Secondary | ICD-10-CM | POA: Diagnosis not present

## 2022-01-31 DIAGNOSIS — E1129 Type 2 diabetes mellitus with other diabetic kidney complication: Secondary | ICD-10-CM | POA: Diagnosis not present

## 2022-01-31 DIAGNOSIS — L299 Pruritus, unspecified: Secondary | ICD-10-CM | POA: Diagnosis not present

## 2022-01-31 DIAGNOSIS — N186 End stage renal disease: Secondary | ICD-10-CM | POA: Diagnosis not present

## 2022-01-31 DIAGNOSIS — Z992 Dependence on renal dialysis: Secondary | ICD-10-CM | POA: Diagnosis not present

## 2022-02-02 DIAGNOSIS — E1129 Type 2 diabetes mellitus with other diabetic kidney complication: Secondary | ICD-10-CM | POA: Diagnosis not present

## 2022-02-02 DIAGNOSIS — L299 Pruritus, unspecified: Secondary | ICD-10-CM | POA: Diagnosis not present

## 2022-02-02 DIAGNOSIS — N2581 Secondary hyperparathyroidism of renal origin: Secondary | ICD-10-CM | POA: Diagnosis not present

## 2022-02-02 DIAGNOSIS — Z992 Dependence on renal dialysis: Secondary | ICD-10-CM | POA: Diagnosis not present

## 2022-02-02 DIAGNOSIS — R52 Pain, unspecified: Secondary | ICD-10-CM | POA: Diagnosis not present

## 2022-02-02 DIAGNOSIS — N186 End stage renal disease: Secondary | ICD-10-CM | POA: Diagnosis not present

## 2022-02-05 DIAGNOSIS — N186 End stage renal disease: Secondary | ICD-10-CM | POA: Diagnosis not present

## 2022-02-05 DIAGNOSIS — Z992 Dependence on renal dialysis: Secondary | ICD-10-CM | POA: Diagnosis not present

## 2022-02-05 DIAGNOSIS — N2581 Secondary hyperparathyroidism of renal origin: Secondary | ICD-10-CM | POA: Diagnosis not present

## 2022-02-05 DIAGNOSIS — E1129 Type 2 diabetes mellitus with other diabetic kidney complication: Secondary | ICD-10-CM | POA: Diagnosis not present

## 2022-02-05 DIAGNOSIS — R52 Pain, unspecified: Secondary | ICD-10-CM | POA: Diagnosis not present

## 2022-02-05 DIAGNOSIS — L299 Pruritus, unspecified: Secondary | ICD-10-CM | POA: Diagnosis not present

## 2022-02-07 DIAGNOSIS — N2581 Secondary hyperparathyroidism of renal origin: Secondary | ICD-10-CM | POA: Diagnosis not present

## 2022-02-07 DIAGNOSIS — R52 Pain, unspecified: Secondary | ICD-10-CM | POA: Diagnosis not present

## 2022-02-07 DIAGNOSIS — N186 End stage renal disease: Secondary | ICD-10-CM | POA: Diagnosis not present

## 2022-02-07 DIAGNOSIS — Z992 Dependence on renal dialysis: Secondary | ICD-10-CM | POA: Diagnosis not present

## 2022-02-07 DIAGNOSIS — L299 Pruritus, unspecified: Secondary | ICD-10-CM | POA: Diagnosis not present

## 2022-02-07 DIAGNOSIS — E1129 Type 2 diabetes mellitus with other diabetic kidney complication: Secondary | ICD-10-CM | POA: Diagnosis not present

## 2022-02-09 DIAGNOSIS — Z992 Dependence on renal dialysis: Secondary | ICD-10-CM | POA: Diagnosis not present

## 2022-02-09 DIAGNOSIS — R52 Pain, unspecified: Secondary | ICD-10-CM | POA: Diagnosis not present

## 2022-02-09 DIAGNOSIS — L299 Pruritus, unspecified: Secondary | ICD-10-CM | POA: Diagnosis not present

## 2022-02-09 DIAGNOSIS — E1129 Type 2 diabetes mellitus with other diabetic kidney complication: Secondary | ICD-10-CM | POA: Diagnosis not present

## 2022-02-09 DIAGNOSIS — N2581 Secondary hyperparathyroidism of renal origin: Secondary | ICD-10-CM | POA: Diagnosis not present

## 2022-02-09 DIAGNOSIS — N186 End stage renal disease: Secondary | ICD-10-CM | POA: Diagnosis not present

## 2022-02-11 DIAGNOSIS — N2581 Secondary hyperparathyroidism of renal origin: Secondary | ICD-10-CM | POA: Diagnosis not present

## 2022-02-11 DIAGNOSIS — L299 Pruritus, unspecified: Secondary | ICD-10-CM | POA: Diagnosis not present

## 2022-02-11 DIAGNOSIS — R52 Pain, unspecified: Secondary | ICD-10-CM | POA: Diagnosis not present

## 2022-02-11 DIAGNOSIS — E1129 Type 2 diabetes mellitus with other diabetic kidney complication: Secondary | ICD-10-CM | POA: Diagnosis not present

## 2022-02-11 DIAGNOSIS — Z992 Dependence on renal dialysis: Secondary | ICD-10-CM | POA: Diagnosis not present

## 2022-02-11 DIAGNOSIS — N186 End stage renal disease: Secondary | ICD-10-CM | POA: Diagnosis not present

## 2022-02-13 DIAGNOSIS — L299 Pruritus, unspecified: Secondary | ICD-10-CM | POA: Diagnosis not present

## 2022-02-13 DIAGNOSIS — N2581 Secondary hyperparathyroidism of renal origin: Secondary | ICD-10-CM | POA: Diagnosis not present

## 2022-02-13 DIAGNOSIS — N186 End stage renal disease: Secondary | ICD-10-CM | POA: Diagnosis not present

## 2022-02-13 DIAGNOSIS — Z992 Dependence on renal dialysis: Secondary | ICD-10-CM | POA: Diagnosis not present

## 2022-02-13 DIAGNOSIS — E1129 Type 2 diabetes mellitus with other diabetic kidney complication: Secondary | ICD-10-CM | POA: Diagnosis not present

## 2022-02-13 DIAGNOSIS — R52 Pain, unspecified: Secondary | ICD-10-CM | POA: Diagnosis not present

## 2022-02-19 DIAGNOSIS — N2581 Secondary hyperparathyroidism of renal origin: Secondary | ICD-10-CM | POA: Diagnosis not present

## 2022-02-19 DIAGNOSIS — L299 Pruritus, unspecified: Secondary | ICD-10-CM | POA: Diagnosis not present

## 2022-02-19 DIAGNOSIS — E1129 Type 2 diabetes mellitus with other diabetic kidney complication: Secondary | ICD-10-CM | POA: Diagnosis not present

## 2022-02-19 DIAGNOSIS — R52 Pain, unspecified: Secondary | ICD-10-CM | POA: Diagnosis not present

## 2022-02-19 DIAGNOSIS — Z992 Dependence on renal dialysis: Secondary | ICD-10-CM | POA: Diagnosis not present

## 2022-02-19 DIAGNOSIS — N186 End stage renal disease: Secondary | ICD-10-CM | POA: Diagnosis not present

## 2022-02-21 DIAGNOSIS — N186 End stage renal disease: Secondary | ICD-10-CM | POA: Diagnosis not present

## 2022-02-21 DIAGNOSIS — R52 Pain, unspecified: Secondary | ICD-10-CM | POA: Diagnosis not present

## 2022-02-21 DIAGNOSIS — L299 Pruritus, unspecified: Secondary | ICD-10-CM | POA: Diagnosis not present

## 2022-02-21 DIAGNOSIS — N2581 Secondary hyperparathyroidism of renal origin: Secondary | ICD-10-CM | POA: Diagnosis not present

## 2022-02-21 DIAGNOSIS — E1129 Type 2 diabetes mellitus with other diabetic kidney complication: Secondary | ICD-10-CM | POA: Diagnosis not present

## 2022-02-21 DIAGNOSIS — E1122 Type 2 diabetes mellitus with diabetic chronic kidney disease: Secondary | ICD-10-CM | POA: Diagnosis not present

## 2022-02-21 DIAGNOSIS — Z992 Dependence on renal dialysis: Secondary | ICD-10-CM | POA: Diagnosis not present

## 2022-02-23 DIAGNOSIS — N2581 Secondary hyperparathyroidism of renal origin: Secondary | ICD-10-CM | POA: Diagnosis not present

## 2022-02-23 DIAGNOSIS — N186 End stage renal disease: Secondary | ICD-10-CM | POA: Diagnosis not present

## 2022-02-23 DIAGNOSIS — Z992 Dependence on renal dialysis: Secondary | ICD-10-CM | POA: Diagnosis not present

## 2022-02-23 DIAGNOSIS — L299 Pruritus, unspecified: Secondary | ICD-10-CM | POA: Diagnosis not present

## 2022-02-23 DIAGNOSIS — D631 Anemia in chronic kidney disease: Secondary | ICD-10-CM | POA: Diagnosis not present

## 2022-02-23 DIAGNOSIS — R52 Pain, unspecified: Secondary | ICD-10-CM | POA: Diagnosis not present

## 2022-02-23 DIAGNOSIS — E1129 Type 2 diabetes mellitus with other diabetic kidney complication: Secondary | ICD-10-CM | POA: Diagnosis not present

## 2022-02-26 DIAGNOSIS — R52 Pain, unspecified: Secondary | ICD-10-CM | POA: Diagnosis not present

## 2022-02-26 DIAGNOSIS — L299 Pruritus, unspecified: Secondary | ICD-10-CM | POA: Diagnosis not present

## 2022-02-26 DIAGNOSIS — N186 End stage renal disease: Secondary | ICD-10-CM | POA: Diagnosis not present

## 2022-02-26 DIAGNOSIS — E1129 Type 2 diabetes mellitus with other diabetic kidney complication: Secondary | ICD-10-CM | POA: Diagnosis not present

## 2022-02-26 DIAGNOSIS — D631 Anemia in chronic kidney disease: Secondary | ICD-10-CM | POA: Diagnosis not present

## 2022-02-26 DIAGNOSIS — Z992 Dependence on renal dialysis: Secondary | ICD-10-CM | POA: Diagnosis not present

## 2022-02-26 DIAGNOSIS — N2581 Secondary hyperparathyroidism of renal origin: Secondary | ICD-10-CM | POA: Diagnosis not present

## 2022-02-28 ENCOUNTER — Ambulatory Visit: Payer: Self-pay

## 2022-02-28 ENCOUNTER — Telehealth: Payer: Self-pay | Admitting: Podiatry

## 2022-02-28 DIAGNOSIS — Z992 Dependence on renal dialysis: Secondary | ICD-10-CM | POA: Diagnosis not present

## 2022-02-28 DIAGNOSIS — L299 Pruritus, unspecified: Secondary | ICD-10-CM | POA: Diagnosis not present

## 2022-02-28 DIAGNOSIS — R52 Pain, unspecified: Secondary | ICD-10-CM | POA: Diagnosis not present

## 2022-02-28 DIAGNOSIS — E1149 Type 2 diabetes mellitus with other diabetic neurological complication: Secondary | ICD-10-CM

## 2022-02-28 DIAGNOSIS — N186 End stage renal disease: Secondary | ICD-10-CM | POA: Diagnosis not present

## 2022-02-28 DIAGNOSIS — E1129 Type 2 diabetes mellitus with other diabetic kidney complication: Secondary | ICD-10-CM | POA: Diagnosis not present

## 2022-02-28 DIAGNOSIS — N2581 Secondary hyperparathyroidism of renal origin: Secondary | ICD-10-CM | POA: Diagnosis not present

## 2022-02-28 DIAGNOSIS — D631 Anemia in chronic kidney disease: Secondary | ICD-10-CM | POA: Diagnosis not present

## 2022-02-28 NOTE — Progress Notes (Signed)
Patient presents today to pick up custom molded foot orthotics recommended by Dr. Jacqualyn Posey.   Orthotics were dispensed and fit was satisfactory. Reviewed instructions for break-in and wear. Written instructions given to patient.  Patient will follow up as needed.  Diabetic shoes and orthotics

## 2022-02-28 NOTE — Telephone Encounter (Signed)
Left message on vm that diabetic shoes are in .  If patient does not call back we will have him try them on with Dr Jacqualyn Posey on his 12/14 appointment.

## 2022-03-02 DIAGNOSIS — R52 Pain, unspecified: Secondary | ICD-10-CM | POA: Diagnosis not present

## 2022-03-02 DIAGNOSIS — E1129 Type 2 diabetes mellitus with other diabetic kidney complication: Secondary | ICD-10-CM | POA: Diagnosis not present

## 2022-03-02 DIAGNOSIS — L299 Pruritus, unspecified: Secondary | ICD-10-CM | POA: Diagnosis not present

## 2022-03-02 DIAGNOSIS — N186 End stage renal disease: Secondary | ICD-10-CM | POA: Diagnosis not present

## 2022-03-02 DIAGNOSIS — D631 Anemia in chronic kidney disease: Secondary | ICD-10-CM | POA: Diagnosis not present

## 2022-03-02 DIAGNOSIS — N2581 Secondary hyperparathyroidism of renal origin: Secondary | ICD-10-CM | POA: Diagnosis not present

## 2022-03-02 DIAGNOSIS — Z992 Dependence on renal dialysis: Secondary | ICD-10-CM | POA: Diagnosis not present

## 2022-03-05 DIAGNOSIS — E1129 Type 2 diabetes mellitus with other diabetic kidney complication: Secondary | ICD-10-CM | POA: Diagnosis not present

## 2022-03-05 DIAGNOSIS — N186 End stage renal disease: Secondary | ICD-10-CM | POA: Diagnosis not present

## 2022-03-05 DIAGNOSIS — L299 Pruritus, unspecified: Secondary | ICD-10-CM | POA: Diagnosis not present

## 2022-03-05 DIAGNOSIS — D631 Anemia in chronic kidney disease: Secondary | ICD-10-CM | POA: Diagnosis not present

## 2022-03-05 DIAGNOSIS — R52 Pain, unspecified: Secondary | ICD-10-CM | POA: Diagnosis not present

## 2022-03-05 DIAGNOSIS — Z992 Dependence on renal dialysis: Secondary | ICD-10-CM | POA: Diagnosis not present

## 2022-03-05 DIAGNOSIS — N2581 Secondary hyperparathyroidism of renal origin: Secondary | ICD-10-CM | POA: Diagnosis not present

## 2022-03-07 ENCOUNTER — Ambulatory Visit (INDEPENDENT_AMBULATORY_CARE_PROVIDER_SITE_OTHER): Payer: Medicare Other | Admitting: Podiatry

## 2022-03-07 VITALS — BP 137/76 | HR 78

## 2022-03-07 DIAGNOSIS — M79674 Pain in right toe(s): Secondary | ICD-10-CM | POA: Diagnosis not present

## 2022-03-07 DIAGNOSIS — B351 Tinea unguium: Secondary | ICD-10-CM | POA: Diagnosis not present

## 2022-03-07 DIAGNOSIS — M79675 Pain in left toe(s): Secondary | ICD-10-CM | POA: Diagnosis not present

## 2022-03-07 DIAGNOSIS — E1149 Type 2 diabetes mellitus with other diabetic neurological complication: Secondary | ICD-10-CM

## 2022-03-07 DIAGNOSIS — R52 Pain, unspecified: Secondary | ICD-10-CM | POA: Diagnosis not present

## 2022-03-07 DIAGNOSIS — D631 Anemia in chronic kidney disease: Secondary | ICD-10-CM | POA: Diagnosis not present

## 2022-03-07 DIAGNOSIS — E1129 Type 2 diabetes mellitus with other diabetic kidney complication: Secondary | ICD-10-CM | POA: Diagnosis not present

## 2022-03-07 DIAGNOSIS — L299 Pruritus, unspecified: Secondary | ICD-10-CM | POA: Diagnosis not present

## 2022-03-07 DIAGNOSIS — Z992 Dependence on renal dialysis: Secondary | ICD-10-CM | POA: Diagnosis not present

## 2022-03-07 DIAGNOSIS — N2581 Secondary hyperparathyroidism of renal origin: Secondary | ICD-10-CM | POA: Diagnosis not present

## 2022-03-07 DIAGNOSIS — N186 End stage renal disease: Secondary | ICD-10-CM | POA: Diagnosis not present

## 2022-03-09 DIAGNOSIS — D631 Anemia in chronic kidney disease: Secondary | ICD-10-CM | POA: Diagnosis not present

## 2022-03-09 DIAGNOSIS — N2581 Secondary hyperparathyroidism of renal origin: Secondary | ICD-10-CM | POA: Diagnosis not present

## 2022-03-09 DIAGNOSIS — E1129 Type 2 diabetes mellitus with other diabetic kidney complication: Secondary | ICD-10-CM | POA: Diagnosis not present

## 2022-03-09 DIAGNOSIS — R52 Pain, unspecified: Secondary | ICD-10-CM | POA: Diagnosis not present

## 2022-03-09 DIAGNOSIS — N186 End stage renal disease: Secondary | ICD-10-CM | POA: Diagnosis not present

## 2022-03-09 DIAGNOSIS — Z992 Dependence on renal dialysis: Secondary | ICD-10-CM | POA: Diagnosis not present

## 2022-03-09 DIAGNOSIS — L299 Pruritus, unspecified: Secondary | ICD-10-CM | POA: Diagnosis not present

## 2022-03-09 NOTE — Progress Notes (Signed)
Subjective: Chief Complaint  Patient presents with   Diabetes    Diabetic foot care, A1c- BG- not taking, nail trim, Patient does has numbness TX Gabapentin     66 y.o. returns the office today for painful, elongated, thickened toenails which he cannot trim himself.  He states that diabetic shoes are doing very well for him.  No open lesions.  PCP: Merrilee Seashore, MD   Objective: AAO 3, NAD DP/PT pulses palpable, CRT less than 3 seconds Sensation decreased with [place] Mule. Nails hypertrophic, dystrophic, elongated, brittle, discolored 10. There is tenderness overlying the nails 1-5 bilaterally. There is no surrounding erythema or drainage along the nail sites. No open lesions or pre-ulcerative lesions are identified. Arthritic changes present the first MPJ with decreased range of motion.  Gets occasional discomfort in the big toe joint. No pain with calf compression, swelling, warmth, erythema.  Assessment: Patient presents with symptomatic onychomycosis  Plan: -Treatment options including alternatives, risks, complications were discussed -Nails sharply debrided 10 without complication/bleeding. -Diabetic shoes are fitting well.  He wants another pair next calendar year.  I will schedule him for an appointment with [place]. -Discussed daily foot inspection. If there are any changes, to call the office immediately.  -Follow-up in 3 months or sooner if any problems are to arise. In the meantime, encouraged to call the office with any questions, concerns, changes symptoms.  Celesta Gentile, DPM

## 2022-03-12 DIAGNOSIS — D631 Anemia in chronic kidney disease: Secondary | ICD-10-CM | POA: Diagnosis not present

## 2022-03-12 DIAGNOSIS — N186 End stage renal disease: Secondary | ICD-10-CM | POA: Diagnosis not present

## 2022-03-12 DIAGNOSIS — L299 Pruritus, unspecified: Secondary | ICD-10-CM | POA: Diagnosis not present

## 2022-03-12 DIAGNOSIS — R52 Pain, unspecified: Secondary | ICD-10-CM | POA: Diagnosis not present

## 2022-03-12 DIAGNOSIS — N2581 Secondary hyperparathyroidism of renal origin: Secondary | ICD-10-CM | POA: Diagnosis not present

## 2022-03-12 DIAGNOSIS — Z992 Dependence on renal dialysis: Secondary | ICD-10-CM | POA: Diagnosis not present

## 2022-03-12 DIAGNOSIS — E1129 Type 2 diabetes mellitus with other diabetic kidney complication: Secondary | ICD-10-CM | POA: Diagnosis not present

## 2022-03-14 DIAGNOSIS — E1129 Type 2 diabetes mellitus with other diabetic kidney complication: Secondary | ICD-10-CM | POA: Diagnosis not present

## 2022-03-14 DIAGNOSIS — N186 End stage renal disease: Secondary | ICD-10-CM | POA: Diagnosis not present

## 2022-03-14 DIAGNOSIS — Z992 Dependence on renal dialysis: Secondary | ICD-10-CM | POA: Diagnosis not present

## 2022-03-14 DIAGNOSIS — L299 Pruritus, unspecified: Secondary | ICD-10-CM | POA: Diagnosis not present

## 2022-03-14 DIAGNOSIS — D631 Anemia in chronic kidney disease: Secondary | ICD-10-CM | POA: Diagnosis not present

## 2022-03-14 DIAGNOSIS — N2581 Secondary hyperparathyroidism of renal origin: Secondary | ICD-10-CM | POA: Diagnosis not present

## 2022-03-14 DIAGNOSIS — R52 Pain, unspecified: Secondary | ICD-10-CM | POA: Diagnosis not present

## 2022-03-16 DIAGNOSIS — L299 Pruritus, unspecified: Secondary | ICD-10-CM | POA: Diagnosis not present

## 2022-03-16 DIAGNOSIS — N186 End stage renal disease: Secondary | ICD-10-CM | POA: Diagnosis not present

## 2022-03-16 DIAGNOSIS — D631 Anemia in chronic kidney disease: Secondary | ICD-10-CM | POA: Diagnosis not present

## 2022-03-16 DIAGNOSIS — N2581 Secondary hyperparathyroidism of renal origin: Secondary | ICD-10-CM | POA: Diagnosis not present

## 2022-03-16 DIAGNOSIS — Z992 Dependence on renal dialysis: Secondary | ICD-10-CM | POA: Diagnosis not present

## 2022-03-16 DIAGNOSIS — E1129 Type 2 diabetes mellitus with other diabetic kidney complication: Secondary | ICD-10-CM | POA: Diagnosis not present

## 2022-03-16 DIAGNOSIS — R52 Pain, unspecified: Secondary | ICD-10-CM | POA: Diagnosis not present

## 2022-03-19 DIAGNOSIS — L299 Pruritus, unspecified: Secondary | ICD-10-CM | POA: Diagnosis not present

## 2022-03-19 DIAGNOSIS — Z992 Dependence on renal dialysis: Secondary | ICD-10-CM | POA: Diagnosis not present

## 2022-03-19 DIAGNOSIS — R52 Pain, unspecified: Secondary | ICD-10-CM | POA: Diagnosis not present

## 2022-03-19 DIAGNOSIS — D631 Anemia in chronic kidney disease: Secondary | ICD-10-CM | POA: Diagnosis not present

## 2022-03-19 DIAGNOSIS — E1129 Type 2 diabetes mellitus with other diabetic kidney complication: Secondary | ICD-10-CM | POA: Diagnosis not present

## 2022-03-19 DIAGNOSIS — N2581 Secondary hyperparathyroidism of renal origin: Secondary | ICD-10-CM | POA: Diagnosis not present

## 2022-03-19 DIAGNOSIS — N186 End stage renal disease: Secondary | ICD-10-CM | POA: Diagnosis not present

## 2022-03-21 DIAGNOSIS — D631 Anemia in chronic kidney disease: Secondary | ICD-10-CM | POA: Diagnosis not present

## 2022-03-21 DIAGNOSIS — L299 Pruritus, unspecified: Secondary | ICD-10-CM | POA: Diagnosis not present

## 2022-03-21 DIAGNOSIS — E1129 Type 2 diabetes mellitus with other diabetic kidney complication: Secondary | ICD-10-CM | POA: Diagnosis not present

## 2022-03-21 DIAGNOSIS — N186 End stage renal disease: Secondary | ICD-10-CM | POA: Diagnosis not present

## 2022-03-21 DIAGNOSIS — R52 Pain, unspecified: Secondary | ICD-10-CM | POA: Diagnosis not present

## 2022-03-21 DIAGNOSIS — N2581 Secondary hyperparathyroidism of renal origin: Secondary | ICD-10-CM | POA: Diagnosis not present

## 2022-03-21 DIAGNOSIS — Z992 Dependence on renal dialysis: Secondary | ICD-10-CM | POA: Diagnosis not present

## 2022-03-23 DIAGNOSIS — L299 Pruritus, unspecified: Secondary | ICD-10-CM | POA: Diagnosis not present

## 2022-03-23 DIAGNOSIS — E1129 Type 2 diabetes mellitus with other diabetic kidney complication: Secondary | ICD-10-CM | POA: Diagnosis not present

## 2022-03-23 DIAGNOSIS — Z992 Dependence on renal dialysis: Secondary | ICD-10-CM | POA: Diagnosis not present

## 2022-03-23 DIAGNOSIS — N2581 Secondary hyperparathyroidism of renal origin: Secondary | ICD-10-CM | POA: Diagnosis not present

## 2022-03-23 DIAGNOSIS — D631 Anemia in chronic kidney disease: Secondary | ICD-10-CM | POA: Diagnosis not present

## 2022-03-23 DIAGNOSIS — N186 End stage renal disease: Secondary | ICD-10-CM | POA: Diagnosis not present

## 2022-03-23 DIAGNOSIS — R52 Pain, unspecified: Secondary | ICD-10-CM | POA: Diagnosis not present

## 2022-03-24 DIAGNOSIS — N186 End stage renal disease: Secondary | ICD-10-CM | POA: Diagnosis not present

## 2022-03-24 DIAGNOSIS — E1122 Type 2 diabetes mellitus with diabetic chronic kidney disease: Secondary | ICD-10-CM | POA: Diagnosis not present

## 2022-03-24 DIAGNOSIS — Z992 Dependence on renal dialysis: Secondary | ICD-10-CM | POA: Diagnosis not present

## 2022-03-26 DIAGNOSIS — Z992 Dependence on renal dialysis: Secondary | ICD-10-CM | POA: Diagnosis not present

## 2022-03-26 DIAGNOSIS — N186 End stage renal disease: Secondary | ICD-10-CM | POA: Diagnosis not present

## 2022-03-26 DIAGNOSIS — L299 Pruritus, unspecified: Secondary | ICD-10-CM | POA: Diagnosis not present

## 2022-03-26 DIAGNOSIS — E1129 Type 2 diabetes mellitus with other diabetic kidney complication: Secondary | ICD-10-CM | POA: Diagnosis not present

## 2022-03-26 DIAGNOSIS — N2581 Secondary hyperparathyroidism of renal origin: Secondary | ICD-10-CM | POA: Diagnosis not present

## 2022-03-26 DIAGNOSIS — D631 Anemia in chronic kidney disease: Secondary | ICD-10-CM | POA: Diagnosis not present

## 2022-03-26 DIAGNOSIS — R52 Pain, unspecified: Secondary | ICD-10-CM | POA: Diagnosis not present

## 2022-03-26 DIAGNOSIS — R197 Diarrhea, unspecified: Secondary | ICD-10-CM | POA: Diagnosis not present

## 2022-03-28 ENCOUNTER — Ambulatory Visit (INDEPENDENT_AMBULATORY_CARE_PROVIDER_SITE_OTHER): Payer: Medicare Other

## 2022-03-28 DIAGNOSIS — R52 Pain, unspecified: Secondary | ICD-10-CM | POA: Diagnosis not present

## 2022-03-28 DIAGNOSIS — L299 Pruritus, unspecified: Secondary | ICD-10-CM | POA: Diagnosis not present

## 2022-03-28 DIAGNOSIS — E1129 Type 2 diabetes mellitus with other diabetic kidney complication: Secondary | ICD-10-CM | POA: Diagnosis not present

## 2022-03-28 DIAGNOSIS — D631 Anemia in chronic kidney disease: Secondary | ICD-10-CM | POA: Diagnosis not present

## 2022-03-28 DIAGNOSIS — N2581 Secondary hyperparathyroidism of renal origin: Secondary | ICD-10-CM | POA: Diagnosis not present

## 2022-03-28 DIAGNOSIS — R197 Diarrhea, unspecified: Secondary | ICD-10-CM | POA: Diagnosis not present

## 2022-03-28 DIAGNOSIS — Z992 Dependence on renal dialysis: Secondary | ICD-10-CM | POA: Diagnosis not present

## 2022-03-28 DIAGNOSIS — M2021 Hallux rigidus, right foot: Secondary | ICD-10-CM

## 2022-03-28 DIAGNOSIS — E1149 Type 2 diabetes mellitus with other diabetic neurological complication: Secondary | ICD-10-CM

## 2022-03-28 DIAGNOSIS — N186 End stage renal disease: Secondary | ICD-10-CM | POA: Diagnosis not present

## 2022-03-28 NOTE — Progress Notes (Signed)
[facility] Note  03/29/2022     CHIEF COMPLAINT Patient presents for Retina Follow Up  HISTORY OF PRESENT ILLNESS: Jonathan Mcneil is a 67 y.o. male who presents to the clinic today for:   HPI     Retina Follow Up   This started 5 months ago.  Duration of 5 months.  I, the attending physician,  performed the HPI with the patient and updated documentation appropriately.        Comments   5 month PDR OU pt states no vision changes noticed pt states he does see floaters at times ou but denies any flashes of light        Last edited by Bernarda Caffey, MD on 03/29/2022  1:30 PM.    Pt states vision is stable  Referring physician: Merrilee Seashore, MD [address] [place],  [place] [address]  HISTORICAL INFORMATION:   Selected notes from the MEDICAL RECORD NUMBER Referred by Dr. Maryjane Hurter for heme OD   CURRENT MEDICATIONS: Current Outpatient Medications (Ophthalmic Drugs)  Medication Sig   dorzolamide-timolol (COSOPT) 2-0.5 % ophthalmic solution Place 1 drop into both eyes 2 (two) times daily.   No current facility-administered medications for this visit. (Ophthalmic Drugs)   Current Outpatient Medications (Other)  Medication Sig   acetaminophen (TYLENOL) 650 MG CR tablet Take 1,300 mg by mouth daily as needed for pain.   ENTRESTO 24-26 MG Take 1 tablet by mouth 2 (two) times daily.   gabapentin (NEURONTIN) 300 MG capsule Take 300 mg by mouth at bedtime.   loperamide (IMODIUM) 2 MG capsule Take 1 capsule (2 mg total) by mouth 4 (four) times daily as needed for diarrhea or loose stools.   ondansetron (ZOFRAN) 4 MG tablet Take 1 tablet (4 mg total) by mouth every 8 (eight) hours as needed for nausea or vomiting.   ondansetron (ZOFRAN-ODT) 4 MG disintegrating tablet Take 1 tablet (4 mg total) by mouth every 8 (eight) hours as needed for nausea or vomiting.   pantoprazole (PROTONIX) 40 MG tablet Take 1 tablet (40 mg total) by mouth  2 (two) times daily before a meal. Twice a day x 4 weeks, then daily   sertraline (ZOLOFT) 25 MG tablet Take 25 mg by mouth daily as needed (depression).   sucroferric oxyhydroxide (VELPHORO) 500 MG chewable tablet Chew 1,500 mg by mouth 3 (three) times daily with meals.   No current facility-administered medications for this visit. (Other)   REVIEW OF SYSTEMS: ROS   Positive for: Endocrine, Eyes Last edited by Bernarda Caffey, MD on 03/29/2022  1:30 PM.     ALLERGIES Allergies  Allergen Reactions   Gabapentin Nausea Only   Ibuprofen Hives   Penicillins Itching and Other (See Comments)    Has patient had a PCN reaction causing immediate rash, facial/tongue/throat swelling, SOB or lightheadedness with hypotension: No Has patient had a PCN reaction causing severe rash involving mucus membranes or skin necrosis: No Has patient had a PCN reaction that required hospitalization No Has patient had a PCN reaction occurring within the last 10 years: Unknown If all of the above answers are "NO", then may proceed with Cephalosporin use.   Venlafaxine Other (See Comments)    insomnia   PAST MEDICAL HISTORY Past Medical History:  Diagnosis Date   Cataract    Diabetes mellitus without complication ([place])    Diabetic retinopathy ([place])    ESRD (end stage renal disease) ([place])    GERD (  gastroesophageal reflux disease)    PMH   Hypertension    Hypertensive retinopathy    Low back pain    Lung disease    Metabolic acidosis    Neuromuscular disorder ([place])    peripheral neuropathy   Pancreatitis    Schizophrenia ([place])    does not take medications   Past Surgical History:  Procedure Laterality Date   A/V FISTULAGRAM Left 06/03/2018   Procedure: A/V FISTULAGRAM;  Surgeon: Marty Heck, MD;  Location: [facility];  Service: Cardiovascular;  Laterality: Left;   AV FISTULA PLACEMENT Left 02/11/2018   Procedure: INSERTION OF ARTERIOVENOUS (AV) FISTULA LEFT  ARM;  Surgeon: [name], MD;  Location: [facility];  Service: Vascular;  Laterality: Left;   [place] Left 04/17/2018   Procedure: BASILIC VEIN TRANSPOSITION SECOND STAGE;  Surgeon: [name], MD;  Location: [facility];  Service: Vascular;  Laterality: Left;   BIOPSY  10/20/2019   Procedure: BIOPSY;  Surgeon: Clarene Essex, MD;  Location: [facility];  Service: Endoscopy;;   COLONOSCOPY     DENTAL SURGERY     ESOPHAGOGASTRODUODENOSCOPY N/A 06/16/2021   Procedure: ESOPHAGOGASTRODUODENOSCOPY (EGD);  Surgeon: Wilford Corner, MD;  Location: [facility];  Service: Gastroenterology;  Laterality: N/A;   ESOPHAGOGASTRODUODENOSCOPY (EGD) WITH PROPOFOL N/A 10/20/2019   Procedure: ESOPHAGOGASTRODUODENOSCOPY (EGD) WITH PROPOFOL;  Surgeon: Clarene Essex, MD;  Location: [facility];  Service: Endoscopy;  Laterality: N/A;   INSERTION OF DIALYSIS CATHETER Right 02/25/2018   Procedure: INSERTION OF DIALYSIS CATHETER;  Surgeon: [name], MD;  Location: [facility];  Service: Vascular;  Laterality: Right;   PERIPHERAL VASCULAR BALLOON ANGIOPLASTY  06/03/2018   Procedure: PERIPHERAL VASCULAR BALLOON ANGIOPLASTY;  Surgeon: Marty Heck, MD;  Location: [facility];  Service: Cardiovascular;;  left a/v fistula   FAMILY HISTORY Family History  Problem Relation Age of Onset   Kidney failure Mother    Diabetes Father    Blindness Brother    SOCIAL HISTORY Social History   Tobacco Use   Smoking status: Former   Smokeless tobacco: Never  Scientific laboratory technician Use: Never used  Substance Use Topics   Alcohol use: No   Drug use: Not Currently    Types: Cocaine, Marijuana    Comment: smoked marijuana a few days ago; he denies using cocaine       OPHTHALMIC EXAM:  Base Eye Exam     Visual Acuity (Snellen - Linear)       Right Left   Dist [place] 20/25 -1 20/20 -1         Tonometry (Tonopen, 12:47 PM)       Right Left   Pressure 24 22  Squeezing          Pupils       Pupils Dark Light Shape React APD   Right PERRL 2 2 Round Minimal None   Left PERRL 2 2 Round Minimal None         Visual Fields       Left Right    Full Full         Extraocular Movement       Right Left    Full, Ortho Full, Ortho         Neuro/Psych     Oriented x3: Yes   Mood/Affect: Normal         Dilation     Both eyes: 2.5% Phenylephrine @ 12:47 PM  Slit Lamp and Fundus Exam     Slit Lamp Exam       Right Left   Lids/Lashes Dermatochalasis - upper lid Dermatochalasis - upper lid   Conjunctiva/Sclera Melanosis Melanosis, temporal pinguecula   Cornea trace Punctate epithelial erosions, mild arcus, mild tear film debris trace Punctate epithelial erosions, mild arcus, mild tear film debris   Anterior Chamber Deep and quiet Deep and quiet   Iris Round and dilated, No NVI Round and poorly dilated, No NVI   Lens 2-3+ Nuclear sclerosis, 2-3+ Cortical cataract 2-3+ Nuclear sclerosis, 2-3+ Cortical cataract   Anterior Vitreous Vitreous syneresis, residual white VH settled inferiorly Vitreous syneresis, blood stained vitreous condensations - improved, mild blood clots settling inferiorly and improving, Posterior vitreous detachment         Fundus Exam       Right Left   Disc Mild Pallor, Sharp rim, focal PPP/PPA Mild Pallor, Sharp rim   C/D Ratio 0.4 0.5   Macula Flat, Good foveal reflex, RPE mottling and clumping, cystic changes temporal macula increased, trace MA, mild ERM Flat, good foveal reflex, ERM, RPE mottling and clumping, scattered Microaneurysms/DBH greatest temporal macula, trace cystic changes temporal macula   Vessels attenuated, mild Tortuosity attenuated, mild tortuosity   Periphery Attached, 360 IRH/DBH - improved, pre-retinal heme settled inferiorly - improved, 360 PRP with room for fill in Attached, scattered IRH/DBH greatest nasally, good 360 PRP changes with room for fill in, red blood clots inferiorly turning  white, No RT/RD           IMAGING AND PROCEDURES  Imaging and Procedures for 03/29/2022  OCT, Retina - OU - Both Eyes       Right Eye Quality was good. Central Foveal Thickness: 276. Progression has worsened. Findings include no SRF, abnormal foveal contour, intraretinal hyper-reflective material, epiretinal membrane, intraretinal fluid (Trace vitreous opacities -- persistent, irregular lamination, mild diffuse thinning, interval increase in cystic changes temporal macula).   Left Eye Quality was good. Central Foveal Thickness: 308. Progression has worsened. Findings include abnormal foveal contour, intraretinal hyper-reflective material, epiretinal membrane, intraretinal fluid, subretinal fluid (Tr persistent vitreous opacities; persistent focal pockets of SRF IN periphery - caught on widefield -- slightly improved from prior, ERM with blunted foveal contour, interval increase in cystic changes temporal macula).   Notes *Images captured and stored on drive  Diagnosis / Impression:  OD: Trace vitreous opacities -- persistent, irregular lamination, mild diffuse thinning, interval increase in cystic changes temporal macula OS: Tr persistent vitreous opacities; persistent focal pockets of SRF IN periphery - caught on widefield -- slightly improved from prior, ERM with blunted foveal contour, interval increase in cystic changes temporal macula  Clinical management:  See below  Abbreviations: NFP - Normal foveal profile. CME - cystoid macular edema. PED - pigment epithelial detachment. IRF - intraretinal fluid. SRF - subretinal fluid. EZ - ellipsoid zone. ERM - epiretinal membrane. ORA - outer retinal atrophy. ORT - outer retinal tubulation. SRHM - subretinal hyper-reflective material. IRHM - intraretinal hyper-reflective material      Intravitreal Injection, Pharmacologic Agent - OD - Right Eye       Time Out 03/29/2022. 1:19 PM. Confirmed correct patient, procedure, site, and patient  consented.   Anesthesia Topical anesthesia was used. Anesthetic medications included Lidocaine 2%, Proparacaine 0.5%.   Procedure Preparation included 5% betadine to ocular surface, eyelid speculum. A (32g) needle was used.   Injection: 1.25 mg Bevacizumab 1.25mg /0.52ml   Route: Intravitreal, Site: Right Eye   NDC:  93818-299-37, Lot: [id number], Expiration date: 04/23/2022   Post-op Post injection exam found visual acuity of at least counting fingers. The patient tolerated the procedure well. There were no complications. The patient received written and verbal post procedure care education. Post injection medications were not given.            ASSESSMENT/PLAN:   ICD-10-CM   1. 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 Vitreous hemorrhage of left eye (HCC)  H43.12     3. Vitreous hemorrhage, right eye ([place])  H43.11     4. Posterior vitreous detachment of left eye  H43.812     5. Essential hypertension  I10     6. Hypertensive retinopathy of both eyes  H35.033     7. Epiretinal membrane (ERM) of left eye  H35.372     8. Combined forms of age-related cataract of both eyes  H25.813     9. Bilateral ocular hypertension  H40.053      1-3. Proliferative diabetic retinopathy w/ recurrent VH OU (OD > OS) - s/p IVA OD #1 (12.08.21), #2 (01.11.22), #3 (02.09.22), #4 (11.04.22), #5 (12.02.22), #6 (12.30.22), #7 (01.27.23) - s/p IVA OS #1 (12.10.21), #2 (01.11.22), #3 (02.09.22), #4 (09.09.22), #5 (10.07.22), #6 (11.04.22), #7 (12.02.22), #8 (12.30.22) - FA (12.08.21) shows late-leaking MA, vascular nonperfusion, +NV OU (nasal midzone) - s/p PRP OD (01.26.22) - s/p PRP OS (03.09.22) - BCVA 20/25 OD, 20/20 OS - stable OU - exam shows interval improvement in VH OD; OS  with blood stained vit condensations improving from hemorrhagic PVD, and blood clots settled inferiorly  - OCT shows OD: Trace vitreous opacities -- persistent, irregular lamination, mild diffuse thinning, interval increase in cystic changes temporal macula; OS: Tr persistent vitreous opacities; persistent focal pockets of SRF IN periphery - caught on widefield -- slightly improved from prior, ERM with blunted foveal contour, interval increase in cystic changes temporal macula  - repeat FA 6.6.22 shows interval improvement in NVE/leakage OU s/p PRP OU-- just minimal leakage remains   - recommend holding heparin at dialysis if able--gave note for patient to take to dialysis on 12/23/2020  - recommend IVA OD #8 today, 01.05.24 for DME and persistent VH  - pt wishes to proceed with injection  - RBA of procedure discussed, questions answered - IVA informed consent obtained and signed, 01.05.24 (OU) - see procedure note - f/u 4 weeks, DFE, OCT, possible injections  4. Hemorrhagic PVD OS  - Onset mid-August 2022 w/ new onset floaters, no photopsias  - s/p IVA OS #4 (09.09.22), #5 (10.07.22), #6 (11.04.22), #7 (12.02.22), #8 (12.30.22) - today, VA stably improved to 20/20 OS from 20/40 and 20/80 prior - pt states they have been holding heparin at dialysis - exam shows interval improvement in vitreous heme -- clearing and settling inferiorly - pt reports previously receiving heparin during dialysis (T, Th, Sat) -- may have contributed to interval worsening   - Discussed findings and prognosis  - No RT or RD on 360 peripheral exam  - Reviewed s/s of RT/RD  - Strict return precautions for any such RT/RD signs/symptoms  - VH precautions reviewed -- minimize activities, keep head elevated, hold heparin if able, avoid ASA/NSAIDS  5,6. Hypertensive retinopathy OU - discussed importance of  tight BP control - monitor  7. Epiretinal membrane, left eye  - mild ERM - BCVA 20/20 - asymptomatic, no  metamorphopsia - no indication for surgery at this time - monitor for now - f/u 3 mos -- DFE/OCT  8. Mixed Cataract OU - The symptoms of cataract, surgical options, and treatments and risks were discussed with patient - discussed diagnosis and progression - approaching visual significance - monitor  9. Ocular Hypertension OU  - IOP today: 24,22  - recommend starting Cosopt BID OU    Ophthalmic Meds Ordered this visit:  Meds ordered this encounter  Medications   dorzolamide-timolol (COSOPT) 2-0.5 % ophthalmic solution    Sig: Place 1 drop into both eyes 2 (two) times daily.    Dispense:  10 mL    Refill:  6   Bevacizumab (AVASTIN) SOLN 1.25 mg     Return in about 4 weeks (around 04/26/2022) for PDR w/ DME OU, Dilated Exam, OCT, Possible Injxn.  There are no Patient Instructions on file for this visit.  This document serves as a record of services personally performed by Gardiner Sleeper, MD, PhD. It was created on their behalf by Bernarda Caffey, MD, an ophthalmic technician. The creation of this record is the provider's dictation and/or activities during the visit.    Electronically signed by: Bernarda Caffey, MD 03/29/2022 12:11 AM  Gardiner Sleeper, M.D., Ph.D. Diseases & Surgery of the Retina and Vitreous [facility]  I have reviewed the above documentation for accuracy and completeness, and I agree with the above. Gardiner Sleeper, M.D., Ph.D. 03/31/22 12:14 AM   Abbreviations: M myopia (nearsighted); A astigmatism; H hyperopia (farsighted); P presbyopia; Mrx spectacle prescription;  CTL contact lenses; OD right eye; OS left eye; OU both eyes  XT exotropia; ET esotropia; PEK punctate epithelial keratitis; PEE punctate epithelial erosions; DES dry eye syndrome; MGD meibomian gland dysfunction; ATs artificial tears; PFAT's preservative free artificial tears; [place] nuclear sclerotic cataract; PSC posterior subcapsular cataract; ERM epi-retinal membrane; PVD  posterior vitreous detachment; RD retinal detachment; DM diabetes mellitus; DR diabetic retinopathy; NPDR non-proliferative diabetic retinopathy; PDR proliferative diabetic retinopathy; CSME clinically significant macular edema; DME diabetic macular edema; dbh dot blot hemorrhages; CWS cotton wool spot; POAG primary open angle glaucoma; C/D cup-to-disc ratio; HVF humphrey visual field; GVF goldmann visual field; OCT optical coherence tomography; IOP intraocular pressure; BRVO Branch retinal vein occlusion; CRVO central retinal vein occlusion; CRAO central retinal artery occlusion; BRAO branch retinal artery occlusion; RT retinal tear; SB scleral buckle; PPV pars plana vitrectomy; VH Vitreous hemorrhage; PRP panretinal laser photocoagulation; IVK intravitreal kenalog; VMT vitreomacular traction; MH Macular hole;  NVD neovascularization of the disc; NVE neovascularization elsewhere; AREDS age related eye disease study; ARMD age related macular degeneration; POAG primary open angle glaucoma; EBMD epithelial/anterior basement membrane dystrophy; ACIOL anterior chamber intraocular lens; IOL intraocular lens; PCIOL posterior chamber intraocular lens; Phaco/IOL phacoemulsification with intraocular lens placement; [employer] photorefractive keratectomy; LASIK laser assisted in situ keratomileusis; HTN hypertension; DM diabetes mellitus; COPD chronic obstructive pulmonary disease

## 2022-03-29 ENCOUNTER — Ambulatory Visit (INDEPENDENT_AMBULATORY_CARE_PROVIDER_SITE_OTHER): Payer: Medicare Other | Admitting: Ophthalmology

## 2022-03-29 ENCOUNTER — Encounter (INDEPENDENT_AMBULATORY_CARE_PROVIDER_SITE_OTHER): Payer: Self-pay | Admitting: Ophthalmology

## 2022-03-29 DIAGNOSIS — I10 Essential (primary) hypertension: Secondary | ICD-10-CM | POA: Diagnosis not present

## 2022-03-29 DIAGNOSIS — H35033 Hypertensive retinopathy, bilateral: Secondary | ICD-10-CM

## 2022-03-29 DIAGNOSIS — H4313 Vitreous hemorrhage, bilateral: Secondary | ICD-10-CM | POA: Diagnosis not present

## 2022-03-29 DIAGNOSIS — E113513 Type 2 diabetes mellitus with proliferative diabetic retinopathy with macular edema, bilateral: Secondary | ICD-10-CM

## 2022-03-29 DIAGNOSIS — H25813 Combined forms of age-related cataract, bilateral: Secondary | ICD-10-CM

## 2022-03-29 DIAGNOSIS — H35372 Puckering of macula, left eye: Secondary | ICD-10-CM

## 2022-03-29 DIAGNOSIS — H4311 Vitreous hemorrhage, right eye: Secondary | ICD-10-CM

## 2022-03-29 DIAGNOSIS — H43812 Vitreous degeneration, left eye: Secondary | ICD-10-CM | POA: Diagnosis not present

## 2022-03-29 DIAGNOSIS — H40053 Ocular hypertension, bilateral: Secondary | ICD-10-CM

## 2022-03-29 DIAGNOSIS — H4312 Vitreous hemorrhage, left eye: Secondary | ICD-10-CM

## 2022-03-29 MED ORDER — DORZOLAMIDE HCL-TIMOLOL MAL 2-0.5 % OP SOLN: 1.0000 [drp] | Freq: Two times a day (BID) | OPHTHALMIC | 6 refills | Status: DC

## 2022-03-29 MED ORDER — BEVACIZUMAB CHEMO INJECTION 1.25MG/0.05ML SYRINGE FOR KALEIDOSCOPE
1.2500 mg | INTRAVITREAL | Status: AC | PRN
  Administered 2022-03-29: 1.25 mg via INTRAVITREAL

## 2022-03-30 DIAGNOSIS — L299 Pruritus, unspecified: Secondary | ICD-10-CM | POA: Diagnosis not present

## 2022-03-30 DIAGNOSIS — D631 Anemia in chronic kidney disease: Secondary | ICD-10-CM | POA: Diagnosis not present

## 2022-03-30 DIAGNOSIS — Z992 Dependence on renal dialysis: Secondary | ICD-10-CM | POA: Diagnosis not present

## 2022-03-30 DIAGNOSIS — N2581 Secondary hyperparathyroidism of renal origin: Secondary | ICD-10-CM | POA: Diagnosis not present

## 2022-03-30 DIAGNOSIS — R197 Diarrhea, unspecified: Secondary | ICD-10-CM | POA: Diagnosis not present

## 2022-03-30 DIAGNOSIS — E1129 Type 2 diabetes mellitus with other diabetic kidney complication: Secondary | ICD-10-CM | POA: Diagnosis not present

## 2022-03-30 DIAGNOSIS — R52 Pain, unspecified: Secondary | ICD-10-CM | POA: Diagnosis not present

## 2022-03-30 DIAGNOSIS — N186 End stage renal disease: Secondary | ICD-10-CM | POA: Diagnosis not present

## 2022-04-02 DIAGNOSIS — D631 Anemia in chronic kidney disease: Secondary | ICD-10-CM | POA: Diagnosis not present

## 2022-04-02 DIAGNOSIS — L299 Pruritus, unspecified: Secondary | ICD-10-CM | POA: Diagnosis not present

## 2022-04-02 DIAGNOSIS — R52 Pain, unspecified: Secondary | ICD-10-CM | POA: Diagnosis not present

## 2022-04-02 DIAGNOSIS — Z992 Dependence on renal dialysis: Secondary | ICD-10-CM | POA: Diagnosis not present

## 2022-04-02 DIAGNOSIS — N186 End stage renal disease: Secondary | ICD-10-CM | POA: Diagnosis not present

## 2022-04-02 DIAGNOSIS — N2581 Secondary hyperparathyroidism of renal origin: Secondary | ICD-10-CM | POA: Diagnosis not present

## 2022-04-02 DIAGNOSIS — E1129 Type 2 diabetes mellitus with other diabetic kidney complication: Secondary | ICD-10-CM | POA: Diagnosis not present

## 2022-04-02 DIAGNOSIS — R197 Diarrhea, unspecified: Secondary | ICD-10-CM | POA: Diagnosis not present

## 2022-04-04 DIAGNOSIS — D631 Anemia in chronic kidney disease: Secondary | ICD-10-CM | POA: Diagnosis not present

## 2022-04-04 DIAGNOSIS — Z992 Dependence on renal dialysis: Secondary | ICD-10-CM | POA: Diagnosis not present

## 2022-04-04 DIAGNOSIS — E1129 Type 2 diabetes mellitus with other diabetic kidney complication: Secondary | ICD-10-CM | POA: Diagnosis not present

## 2022-04-04 DIAGNOSIS — R52 Pain, unspecified: Secondary | ICD-10-CM | POA: Diagnosis not present

## 2022-04-04 DIAGNOSIS — N2581 Secondary hyperparathyroidism of renal origin: Secondary | ICD-10-CM | POA: Diagnosis not present

## 2022-04-04 DIAGNOSIS — R197 Diarrhea, unspecified: Secondary | ICD-10-CM | POA: Diagnosis not present

## 2022-04-04 DIAGNOSIS — N186 End stage renal disease: Secondary | ICD-10-CM | POA: Diagnosis not present

## 2022-04-04 DIAGNOSIS — L299 Pruritus, unspecified: Secondary | ICD-10-CM | POA: Diagnosis not present

## 2022-04-06 DIAGNOSIS — L299 Pruritus, unspecified: Secondary | ICD-10-CM | POA: Diagnosis not present

## 2022-04-06 DIAGNOSIS — D631 Anemia in chronic kidney disease: Secondary | ICD-10-CM | POA: Diagnosis not present

## 2022-04-06 DIAGNOSIS — N2581 Secondary hyperparathyroidism of renal origin: Secondary | ICD-10-CM | POA: Diagnosis not present

## 2022-04-06 DIAGNOSIS — Z992 Dependence on renal dialysis: Secondary | ICD-10-CM | POA: Diagnosis not present

## 2022-04-06 DIAGNOSIS — R52 Pain, unspecified: Secondary | ICD-10-CM | POA: Diagnosis not present

## 2022-04-06 DIAGNOSIS — R197 Diarrhea, unspecified: Secondary | ICD-10-CM | POA: Diagnosis not present

## 2022-04-06 DIAGNOSIS — E1129 Type 2 diabetes mellitus with other diabetic kidney complication: Secondary | ICD-10-CM | POA: Diagnosis not present

## 2022-04-06 DIAGNOSIS — N186 End stage renal disease: Secondary | ICD-10-CM | POA: Diagnosis not present

## 2022-04-06 NOTE — Progress Notes (Signed)
Patient presents to the office today for diabetic shoe and insole measuring.  Patient was measured with brannock device to determine size and width for 1 pair of extra depth shoes and foam casted for 3 pair of insoles.   ABN signed.   Documentation of medical necessity will be sent to patient's treating diabetic doctor to verify and sign.   Patient's diabetic provider: DR Merrilee Seashore  Shoes and insoles will be ordered at that time and patient will be notified for an appointment for fitting when they arrive.   Brannock measurement: 85 W  Patient shoe selection-   1st   Shoe choice:   [id number]  Shoe size ordered: 11 W

## 2022-04-08 DIAGNOSIS — Z992 Dependence on renal dialysis: Secondary | ICD-10-CM | POA: Diagnosis not present

## 2022-04-08 DIAGNOSIS — I129 Hypertensive chronic kidney disease with stage 1 through stage 4 chronic kidney disease, or unspecified chronic kidney disease: Secondary | ICD-10-CM | POA: Diagnosis not present

## 2022-04-08 DIAGNOSIS — E782 Mixed hyperlipidemia: Secondary | ICD-10-CM | POA: Diagnosis not present

## 2022-04-08 DIAGNOSIS — I700 Atherosclerosis of aorta: Secondary | ICD-10-CM | POA: Diagnosis not present

## 2022-04-08 DIAGNOSIS — I5022 Chronic systolic (congestive) heart failure: Secondary | ICD-10-CM | POA: Diagnosis not present

## 2022-04-08 DIAGNOSIS — I340 Nonrheumatic mitral (valve) insufficiency: Secondary | ICD-10-CM | POA: Diagnosis not present

## 2022-04-08 DIAGNOSIS — E1165 Type 2 diabetes mellitus with hyperglycemia: Secondary | ICD-10-CM | POA: Diagnosis not present

## 2022-04-08 DIAGNOSIS — N185 Chronic kidney disease, stage 5: Secondary | ICD-10-CM | POA: Diagnosis not present

## 2022-04-08 DIAGNOSIS — N186 End stage renal disease: Secondary | ICD-10-CM | POA: Diagnosis not present

## 2022-04-08 DIAGNOSIS — E103513 Type 1 diabetes mellitus with proliferative diabetic retinopathy with macular edema, bilateral: Secondary | ICD-10-CM | POA: Diagnosis not present

## 2022-04-09 DIAGNOSIS — R197 Diarrhea, unspecified: Secondary | ICD-10-CM | POA: Diagnosis not present

## 2022-04-09 DIAGNOSIS — L299 Pruritus, unspecified: Secondary | ICD-10-CM | POA: Diagnosis not present

## 2022-04-09 DIAGNOSIS — R52 Pain, unspecified: Secondary | ICD-10-CM | POA: Diagnosis not present

## 2022-04-09 DIAGNOSIS — Z992 Dependence on renal dialysis: Secondary | ICD-10-CM | POA: Diagnosis not present

## 2022-04-09 DIAGNOSIS — D631 Anemia in chronic kidney disease: Secondary | ICD-10-CM | POA: Diagnosis not present

## 2022-04-09 DIAGNOSIS — E1129 Type 2 diabetes mellitus with other diabetic kidney complication: Secondary | ICD-10-CM | POA: Diagnosis not present

## 2022-04-09 DIAGNOSIS — N186 End stage renal disease: Secondary | ICD-10-CM | POA: Diagnosis not present

## 2022-04-09 DIAGNOSIS — N2581 Secondary hyperparathyroidism of renal origin: Secondary | ICD-10-CM | POA: Diagnosis not present

## 2022-04-10 ENCOUNTER — Ambulatory Visit (INDEPENDENT_AMBULATORY_CARE_PROVIDER_SITE_OTHER): Payer: Medicare Other | Admitting: Ophthalmology

## 2022-04-10 ENCOUNTER — Encounter (INDEPENDENT_AMBULATORY_CARE_PROVIDER_SITE_OTHER): Payer: Self-pay | Admitting: Ophthalmology

## 2022-04-10 DIAGNOSIS — E113513 Type 2 diabetes mellitus with proliferative diabetic retinopathy with macular edema, bilateral: Secondary | ICD-10-CM | POA: Diagnosis not present

## 2022-04-10 DIAGNOSIS — H43812 Vitreous degeneration, left eye: Secondary | ICD-10-CM | POA: Diagnosis not present

## 2022-04-10 DIAGNOSIS — H35033 Hypertensive retinopathy, bilateral: Secondary | ICD-10-CM | POA: Diagnosis not present

## 2022-04-10 DIAGNOSIS — H35372 Puckering of macula, left eye: Secondary | ICD-10-CM | POA: Diagnosis not present

## 2022-04-10 DIAGNOSIS — H4311 Vitreous hemorrhage, right eye: Secondary | ICD-10-CM

## 2022-04-10 DIAGNOSIS — I10 Essential (primary) hypertension: Secondary | ICD-10-CM

## 2022-04-10 DIAGNOSIS — H4313 Vitreous hemorrhage, bilateral: Secondary | ICD-10-CM

## 2022-04-10 DIAGNOSIS — H4312 Vitreous hemorrhage, left eye: Secondary | ICD-10-CM

## 2022-04-10 DIAGNOSIS — H40053 Ocular hypertension, bilateral: Secondary | ICD-10-CM

## 2022-04-10 DIAGNOSIS — H25813 Combined forms of age-related cataract, bilateral: Secondary | ICD-10-CM

## 2022-04-10 NOTE — Progress Notes (Signed)
[facility] Note  04/10/2022     CHIEF COMPLAINT Patient presents for Retina Evaluation  HISTORY OF PRESENT ILLNESS: Jonathan Mcneil is a 67 y.o. male who presents to the clinic today for:   HPI     Retina Evaluation   In right eye.  Associated Symptoms Floaters.  Response to treatment was significant improvement.  I, the attending physician,  performed the HPI with the patient and updated documentation appropriately.        Comments   Patient here for Retina Evaluation. Patient state vision vision has improved a whole lot. It was like a film over OD. No eye pain. Eyes itch a lot OU.       Last edited by Bernarda Caffey, MD on 04/12/2022  3:59 PM.     Patient states had film over vision OD. Patient self discontinued cosopt bid OU.  Referring physician: Merrilee Seashore, MD [address],  [place] [phone]  HISTORICAL INFORMATION:   Selected notes from the MEDICAL RECORD NUMBER Referred by Dr. Maryjane Hurter for heme OD   CURRENT MEDICATIONS: Current Outpatient Medications (Ophthalmic Drugs)  Medication Sig   dorzolamide-timolol (COSOPT) 2-0.5 % ophthalmic solution Place 1 drop into both eyes 2 (two) times daily.   No current facility-administered medications for this visit. (Ophthalmic Drugs)   Current Outpatient Medications (Other)  Medication Sig   acetaminophen (TYLENOL) 650 MG CR tablet Take 1,300 mg by mouth daily as needed for pain.   ENTRESTO 24-26 MG Take 1 tablet by mouth 2 (two) times daily.   gabapentin (NEURONTIN) 300 MG capsule Take 300 mg by mouth at bedtime.   loperamide (IMODIUM) 2 MG capsule Take 1 capsule (2 mg total) by mouth 4 (four) times daily as needed for diarrhea or loose stools.   ondansetron (ZOFRAN) 4 MG tablet Take 1 tablet (4 mg total) by mouth every 8 (eight) hours as needed for nausea or vomiting.   ondansetron (ZOFRAN-ODT) 4 MG disintegrating tablet Take 1 tablet (4 mg total) by mouth  every 8 (eight) hours as needed for nausea or vomiting.   pantoprazole (PROTONIX) 40 MG tablet Take 1 tablet (40 mg total) by mouth 2 (two) times daily before a meal. Twice a day x 4 weeks, then daily   sertraline (ZOLOFT) 25 MG tablet Take 25 mg by mouth daily as needed (depression).   sucroferric oxyhydroxide (VELPHORO) 500 MG chewable tablet Chew 1,500 mg by mouth 3 (three) times daily with meals.   No current facility-administered medications for this visit. (Other)   REVIEW OF SYSTEMS: ROS   Positive for: Gastrointestinal, Neurological, Genitourinary, Endocrine, Eyes Negative for: Constitutional, Skin, Musculoskeletal, HENT, Cardiovascular, Respiratory, Psychiatric, Allergic/Imm, Heme/Lymph Last edited by Theodore Demark, COA on 04/10/2022  2:56 PM.     ALLERGIES Allergies  Allergen Reactions   Gabapentin Nausea Only   Ibuprofen Hives   Penicillins Itching and Other (See Comments)    Has patient had a PCN reaction causing immediate rash, facial/tongue/throat swelling, SOB or lightheadedness with hypotension: No Has patient had a PCN reaction causing severe rash involving mucus membranes or skin necrosis: No Has patient had a PCN reaction that required hospitalization No Has patient had a PCN reaction occurring within the last 10 years: Unknown If all of the above answers are "NO", then may proceed with Cephalosporin use.   Venlafaxine Other (See Comments)    insomnia   PAST MEDICAL HISTORY Past Medical History:  Diagnosis Date  Cataract    Diabetes mellitus without complication ([place])    Diabetic retinopathy ([place])    ESRD (end stage renal disease) ([place])    GERD (gastroesophageal reflux disease)    PMH   Hypertension    Hypertensive retinopathy    Low back pain    Lung disease    Metabolic acidosis    Neuromuscular disorder ([place])    peripheral neuropathy   Pancreatitis    Schizophrenia ([place])    does not take medications   Past Surgical History:  Procedure  Laterality Date   A/V FISTULAGRAM Left 06/03/2018   Procedure: A/V FISTULAGRAM;  Surgeon: Marty Heck, MD;  Location: [facility];  Service: Cardiovascular;  Laterality: Left;   AV FISTULA PLACEMENT Left 02/11/2018   Procedure: INSERTION OF ARTERIOVENOUS (AV) FISTULA LEFT  ARM;  Surgeon: [name], MD;  Location: [facility];  Service: Vascular;  Laterality: Left;   [place] Left 04/17/2018   Procedure: BASILIC VEIN TRANSPOSITION SECOND STAGE;  Surgeon: [name], MD;  Location: [facility];  Service: Vascular;  Laterality: Left;   BIOPSY  10/20/2019   Procedure: BIOPSY;  Surgeon: Clarene Essex, MD;  Location: [facility];  Service: Endoscopy;;   COLONOSCOPY     DENTAL SURGERY     ESOPHAGOGASTRODUODENOSCOPY N/A 06/16/2021   Procedure: ESOPHAGOGASTRODUODENOSCOPY (EGD);  Surgeon: Wilford Corner, MD;  Location: [facility];  Service: Gastroenterology;  Laterality: N/A;   ESOPHAGOGASTRODUODENOSCOPY (EGD) WITH PROPOFOL N/A 10/20/2019   Procedure: ESOPHAGOGASTRODUODENOSCOPY (EGD) WITH PROPOFOL;  Surgeon: Clarene Essex, MD;  Location: [facility];  Service: Endoscopy;  Laterality: N/A;   INSERTION OF DIALYSIS CATHETER Right 02/25/2018   Procedure: INSERTION OF DIALYSIS CATHETER;  Surgeon: [name], MD;  Location: [facility];  Service: Vascular;  Laterality: Right;   PERIPHERAL VASCULAR BALLOON ANGIOPLASTY  06/03/2018   Procedure: PERIPHERAL VASCULAR BALLOON ANGIOPLASTY;  Surgeon: Marty Heck, MD;  Location: [facility];  Service: Cardiovascular;;  left a/v fistula   FAMILY HISTORY Family History  Problem Relation Age of Onset   Kidney failure Mother    Diabetes Father    Blindness Brother    SOCIAL HISTORY Social History   Tobacco Use   Smoking status: Former   Smokeless tobacco: Never  Scientific laboratory technician Use: Never used  Substance Use Topics   Alcohol use: No   Drug use: Not Currently    Types: Cocaine,  Marijuana    Comment: smoked marijuana a few days ago; he denies using cocaine       OPHTHALMIC EXAM:  Base Eye Exam     Visual Acuity (Snellen - Linear)       Right Left   Dist [place] 20/20 20/20         Tonometry (Tonopen, 2:50 PM)       Right Left   Pressure 22 21         Pupils       Dark Light Shape React APD   Right 2 2 Round Minimal None   Left 2 2 Round Minimal None         Visual Fields (Counting fingers)       Left Right    Full Full         Extraocular Movement       Right Left    Full, Ortho Full, Ortho         Neuro/Psych     Oriented x3: Yes   Mood/Affect: Normal  Dilation     Both eyes: 1.0% Mydriacyl, 2.5% Phenylephrine @ 2:50 PM           Slit Lamp and Fundus Exam     Slit Lamp Exam       Right Left   Lids/Lashes Dermatochalasis - upper lid Dermatochalasis - upper lid   Conjunctiva/Sclera Melanosis Melanosis, temporal pinguecula   Cornea trace Punctate epithelial erosions, mild arcus, mild tear film debris trace Punctate epithelial erosions, mild arcus, mild tear film debris   Anterior Chamber Deep and quiet Deep and quiet   Iris Round and dilated, No NVI Round and poorly dilated, No NVI   Lens 2-3+ Nuclear sclerosis, 2-3+ Cortical cataract 2-3+ Nuclear sclerosis, 2-3+ Cortical cataract   Anterior Vitreous Vitreous syneresis, residual white VH settled inferiorly, new blood stained vitreous condensations over superior macula Vitreous syneresis, blood stained vitreous condensations - improved, mild blood clots settling inferiorly and improving, Posterior vitreous detachment         Fundus Exam       Right Left   Disc Mild Pallor, Sharp rim, focal PPP/PPA Mild Pallor, Sharp rim   C/D Ratio 0.4 0.5   Macula Flat, Good foveal reflex, RPE mottling and clumping, cystic changes temporal macula increased, trace MA, + ERM Flat, good foveal reflex, ERM, RPE mottling and clumping, scattered Microaneurysms/DBH greatest  temporal macula, trace cystic changes temporal macula--slightly improved   Vessels attenuated, mild Tortuosity attenuated, mild tortuosity   Periphery Attached, 360 IRH/DBH - improved, pre-retinal heme settled inferiorly, 360 PRP with room for fill in Attached, scattered IRH/DBH greatest nasally, good 360 PRP changes with room for fill in, red blood clots inferiorly now white, No RT/RD           IMAGING AND PROCEDURES  Imaging and Procedures for 04/10/2022  OCT, Retina - OU - Both Eyes       Right Eye Quality was good. Central Foveal Thickness: 267. Progression has worsened. Findings include no SRF, abnormal foveal contour, intraretinal hyper-reflective material, epiretinal membrane, intraretinal fluid (Vitreous opacities--increased, irregular lamination, mild diffuse thinning, interval increase in cystic changes temporal macula).   Left Eye Quality was good. Central Foveal Thickness: 308. Progression has improved. Findings include abnormal foveal contour, intraretinal hyper-reflective material, epiretinal membrane, intraretinal fluid, subretinal fluid (Tr persistent vitreous opacities; persistent focal pockets of SRF IN periphery - caught on widefield, ERM with blunted foveal contour, mild interval improvement in cystic changes temporal macula).   Notes *Images captured and stored on drive  Diagnosis / Impression:  OD: vitreous opacities increased, irregular lamination, mild diffuse thinning, interval increase in cystic changes temporal macula OS: Tr persistent vitreous opacities; persistent focal pockets of SRF IN periphery - caught on widefield -- slightly improved from prior, ERM with blunted foveal contour, interval increase in cystic changes temporal macula  Clinical management:  See below  Abbreviations: NFP - Normal foveal profile. CME - cystoid macular edema. PED - pigment epithelial detachment. IRF - intraretinal fluid. SRF - subretinal fluid. EZ - ellipsoid zone. ERM -  epiretinal membrane. ORA - outer retinal atrophy. ORT - outer retinal tubulation. SRHM - subretinal hyper-reflective material. IRHM - intraretinal hyper-reflective material            ASSESSMENT/PLAN:   ICD-10-CM   1. Proliferative diabetic retinopathy of both eyes with macular edema associated with type 2 diabetes mellitus ([place])  E11.3513 OCT, Retina - OU - Both Eyes    2. Vitreous hemorrhage of left eye ([place])  H43.12  3. Vitreous hemorrhage, right eye ([facility])  H43.11     4. Posterior vitreous detachment of left eye  H43.812     5. Essential hypertension  I10     6. Hypertensive retinopathy of both eyes  H35.033     7. Epiretinal membrane (ERM) of left eye  H35.372     8. Combined forms of age-related cataract of both eyes  H25.813     9. Bilateral ocular hypertension  H40.053      **Pt presents acutely for new floaters OD x1 wk**  - pt reports that since onset, floaters have improved significantly and central visual acuity is back to baseline  - exam shows some mild persistent blood stained vit condensations  - s/p IVA OD #8 on 01.05.24 -- so not able repeat IVA again at this time  - monitor for now - [facility] precautions reviewed -- minimize activities, keep head elevated, avoid ASA/NSAIDs/blood thinners as able   - f/u as scheduled in Feb  1-3. Proliferative diabetic retinopathy w/ recurrent VH OU (OD > OS) - s/p IVA OD #1 (12.08.21), #2 (01.11.22), #3 (02.09.22), #4 (11.04.22), #5 (12.02.22), #6 (12.30.22), #7 (01.27.23), #8 (01.05.24) - s/p IVA OS #1 (12.10.21), #2 (01.11.22), #3 (02.09.22), #4 (09.09.22), #5 (10.07.22), #6 (11.04.22), #7 (12.02.22), #8 (12.30.22) - FA (12.08.21) shows late-leaking MA, vascular nonperfusion, +NV OU (nasal midzone) - s/p PRP OD (01.26.22) - s/p PRP OS (03.09.22) - BCVA 20/25 OD, 20/20 OS - stable OU - exam shows interval improvement in VH OD; OS with blood stained vit condensations improving from hemorrhagic PVD, and blood clots settled  inferiorly  - OCT shows OD: vitreous opacities increased, irregular lamination, mild diffuse thinning, interval increase in cystic changes temporal macula; OS: Tr persistent vitreous opacities; persistent focal pockets of SRF IN periphery - caught on widefield -- slightly improved from prior, ERM with blunted foveal contour, interval improvement in cystic changes temporal macula  - repeat FA 6.6.22 shows interval improvement in NVE/leakage OU s/p PRP OU-- just minimal leakage remains   - patient may re-start heparin - IVA informed consent obtained and signed, 01.05.24 (OU) - see procedure note - f/u as scheduled, DFE, OCT, possible injections  4. Hemorrhagic PVD OS  - Onset mid-August 2022 w/ new onset floaters, no photopsias  - s/p IVA OS #4 (09.09.22), #5 (10.07.22), #6 (11.04.22), #7 (12.02.22), #8 (12.30.22) - today, VA stably improved to 20/20 OS from 20/40 and 20/80 prior - pt states they have been holding heparin at dialysis - exam shows interval improvement in vitreous heme -- clearing and settling inferiorly - pt reports previously receiving heparin during dialysis (T, Th, Sat) -- may have contributed to interval worsening   - Discussed findings and prognosis  - No RT or RD on 360 peripheral exam  - Reviewed s/s of RT/RD  - Strict return precautions for any such RT/RD signs/symptoms  - VH precautions reviewed -- minimize activities, keep head elevated, hold heparin if able, avoid ASA/NSAIDS  5,6. Hypertensive retinopathy OU - discussed importance of tight BP control - monitor  7. Epiretinal membrane, left eye  - mild ERM - BCVA 20/20 - asymptomatic, no metamorphopsia - no indication for surgery at this time - monitor for now - f/u 3 mos -- DFE/OCT  8. Mixed Cataract OU - The symptoms of cataract, surgical options, and treatments and risks were discussed with patient - discussed diagnosis and progression - approaching visual significance - monitor  9. Ocular  Hypertension OU  - IOP  today: 24,21  - patient stopped using cosopt--need to re-start cosopt bid OU    Ophthalmic Meds Ordered this visit:  No orders of the defined types were placed in this encounter.    Return as scheduled.  There are no Patient Instructions on file for this visit.  This document serves as a record of services personally performed by Gardiner Sleeper, MD, PhD. It was created on their behalf by Bernarda Caffey, MD, an ophthalmic technician. The creation of this record is the provider's dictation and/or activities during the visit.    Electronically signed by: Bernarda Caffey, MD 03/29/2022 4:00 PM  Gardiner Sleeper, M.D., Ph.D. Diseases & Surgery of the Retina and Vitreous [facility]  I have reviewed the above documentation for accuracy and completeness, and I agree with the above. Gardiner Sleeper, M.D., Ph.D. 03/31/22 4:00 PM   Abbreviations: M myopia (nearsighted); A astigmatism; H hyperopia (farsighted); P presbyopia; Mrx spectacle prescription;  CTL contact lenses; OD right eye; OS left eye; OU both eyes  XT exotropia; ET esotropia; PEK punctate epithelial keratitis; PEE punctate epithelial erosions; DES dry eye syndrome; MGD meibomian gland dysfunction; ATs artificial tears; PFAT's preservative free artificial tears; [place] nuclear sclerotic cataract; PSC posterior subcapsular cataract; ERM epi-retinal membrane; PVD posterior vitreous detachment; RD retinal detachment; DM diabetes mellitus; DR diabetic retinopathy; NPDR non-proliferative diabetic retinopathy; PDR proliferative diabetic retinopathy; CSME clinically significant macular edema; DME diabetic macular edema; dbh dot blot hemorrhages; CWS cotton wool spot; POAG primary open angle glaucoma; C/D cup-to-disc ratio; HVF humphrey visual field; GVF goldmann visual field; OCT optical coherence tomography; IOP intraocular pressure; BRVO Branch retinal vein occlusion; CRVO central retinal vein occlusion;  CRAO central retinal artery occlusion; BRAO branch retinal artery occlusion; RT retinal tear; SB scleral buckle; PPV pars plana vitrectomy; VH Vitreous hemorrhage; PRP panretinal laser photocoagulation; IVK intravitreal kenalog; VMT vitreomacular traction; MH Macular hole;  NVD neovascularization of the disc; NVE neovascularization elsewhere; AREDS age related eye disease study; ARMD age related macular degeneration; POAG primary open angle glaucoma; EBMD epithelial/anterior basement membrane dystrophy; ACIOL anterior chamber intraocular lens; IOL intraocular lens; PCIOL posterior chamber intraocular lens; Phaco/IOL phacoemulsification with intraocular lens placement; [place] photorefractive keratectomy; LASIK laser assisted in situ keratomileusis; HTN hypertension; DM diabetes mellitus; COPD chronic obstructive pulmonary disease

## 2022-04-11 DIAGNOSIS — E1129 Type 2 diabetes mellitus with other diabetic kidney complication: Secondary | ICD-10-CM | POA: Diagnosis not present

## 2022-04-11 DIAGNOSIS — N2581 Secondary hyperparathyroidism of renal origin: Secondary | ICD-10-CM | POA: Diagnosis not present

## 2022-04-11 DIAGNOSIS — D631 Anemia in chronic kidney disease: Secondary | ICD-10-CM | POA: Diagnosis not present

## 2022-04-11 DIAGNOSIS — L299 Pruritus, unspecified: Secondary | ICD-10-CM | POA: Diagnosis not present

## 2022-04-11 DIAGNOSIS — R197 Diarrhea, unspecified: Secondary | ICD-10-CM | POA: Diagnosis not present

## 2022-04-11 DIAGNOSIS — N186 End stage renal disease: Secondary | ICD-10-CM | POA: Diagnosis not present

## 2022-04-11 DIAGNOSIS — Z992 Dependence on renal dialysis: Secondary | ICD-10-CM | POA: Diagnosis not present

## 2022-04-11 DIAGNOSIS — R52 Pain, unspecified: Secondary | ICD-10-CM | POA: Diagnosis not present

## 2022-04-12 ENCOUNTER — Encounter (INDEPENDENT_AMBULATORY_CARE_PROVIDER_SITE_OTHER): Payer: Self-pay | Admitting: Ophthalmology

## 2022-04-13 DIAGNOSIS — D631 Anemia in chronic kidney disease: Secondary | ICD-10-CM | POA: Diagnosis not present

## 2022-04-13 DIAGNOSIS — Z992 Dependence on renal dialysis: Secondary | ICD-10-CM | POA: Diagnosis not present

## 2022-04-13 DIAGNOSIS — R197 Diarrhea, unspecified: Secondary | ICD-10-CM | POA: Diagnosis not present

## 2022-04-13 DIAGNOSIS — R52 Pain, unspecified: Secondary | ICD-10-CM | POA: Diagnosis not present

## 2022-04-13 DIAGNOSIS — E1129 Type 2 diabetes mellitus with other diabetic kidney complication: Secondary | ICD-10-CM | POA: Diagnosis not present

## 2022-04-13 DIAGNOSIS — N2581 Secondary hyperparathyroidism of renal origin: Secondary | ICD-10-CM | POA: Diagnosis not present

## 2022-04-13 DIAGNOSIS — L299 Pruritus, unspecified: Secondary | ICD-10-CM | POA: Diagnosis not present

## 2022-04-13 DIAGNOSIS — N186 End stage renal disease: Secondary | ICD-10-CM | POA: Diagnosis not present

## 2022-04-16 DIAGNOSIS — R197 Diarrhea, unspecified: Secondary | ICD-10-CM | POA: Diagnosis not present

## 2022-04-16 DIAGNOSIS — L299 Pruritus, unspecified: Secondary | ICD-10-CM | POA: Diagnosis not present

## 2022-04-16 DIAGNOSIS — N2581 Secondary hyperparathyroidism of renal origin: Secondary | ICD-10-CM | POA: Diagnosis not present

## 2022-04-16 DIAGNOSIS — Z992 Dependence on renal dialysis: Secondary | ICD-10-CM | POA: Diagnosis not present

## 2022-04-16 DIAGNOSIS — R52 Pain, unspecified: Secondary | ICD-10-CM | POA: Diagnosis not present

## 2022-04-16 DIAGNOSIS — E1129 Type 2 diabetes mellitus with other diabetic kidney complication: Secondary | ICD-10-CM | POA: Diagnosis not present

## 2022-04-16 DIAGNOSIS — D631 Anemia in chronic kidney disease: Secondary | ICD-10-CM | POA: Diagnosis not present

## 2022-04-16 DIAGNOSIS — N186 End stage renal disease: Secondary | ICD-10-CM | POA: Diagnosis not present

## 2022-04-18 DIAGNOSIS — L299 Pruritus, unspecified: Secondary | ICD-10-CM | POA: Diagnosis not present

## 2022-04-18 DIAGNOSIS — E1129 Type 2 diabetes mellitus with other diabetic kidney complication: Secondary | ICD-10-CM | POA: Diagnosis not present

## 2022-04-18 DIAGNOSIS — R197 Diarrhea, unspecified: Secondary | ICD-10-CM | POA: Diagnosis not present

## 2022-04-18 DIAGNOSIS — Z992 Dependence on renal dialysis: Secondary | ICD-10-CM | POA: Diagnosis not present

## 2022-04-18 DIAGNOSIS — R52 Pain, unspecified: Secondary | ICD-10-CM | POA: Diagnosis not present

## 2022-04-18 DIAGNOSIS — N2581 Secondary hyperparathyroidism of renal origin: Secondary | ICD-10-CM | POA: Diagnosis not present

## 2022-04-18 DIAGNOSIS — D631 Anemia in chronic kidney disease: Secondary | ICD-10-CM | POA: Diagnosis not present

## 2022-04-18 DIAGNOSIS — N186 End stage renal disease: Secondary | ICD-10-CM | POA: Diagnosis not present

## 2022-04-20 DIAGNOSIS — R52 Pain, unspecified: Secondary | ICD-10-CM | POA: Diagnosis not present

## 2022-04-20 DIAGNOSIS — N2581 Secondary hyperparathyroidism of renal origin: Secondary | ICD-10-CM | POA: Diagnosis not present

## 2022-04-20 DIAGNOSIS — D631 Anemia in chronic kidney disease: Secondary | ICD-10-CM | POA: Diagnosis not present

## 2022-04-20 DIAGNOSIS — Z992 Dependence on renal dialysis: Secondary | ICD-10-CM | POA: Diagnosis not present

## 2022-04-20 DIAGNOSIS — E1129 Type 2 diabetes mellitus with other diabetic kidney complication: Secondary | ICD-10-CM | POA: Diagnosis not present

## 2022-04-20 DIAGNOSIS — N186 End stage renal disease: Secondary | ICD-10-CM | POA: Diagnosis not present

## 2022-04-20 DIAGNOSIS — L299 Pruritus, unspecified: Secondary | ICD-10-CM | POA: Diagnosis not present

## 2022-04-20 DIAGNOSIS — R197 Diarrhea, unspecified: Secondary | ICD-10-CM | POA: Diagnosis not present

## 2022-04-23 DIAGNOSIS — Z992 Dependence on renal dialysis: Secondary | ICD-10-CM | POA: Diagnosis not present

## 2022-04-23 DIAGNOSIS — E1129 Type 2 diabetes mellitus with other diabetic kidney complication: Secondary | ICD-10-CM | POA: Diagnosis not present

## 2022-04-23 DIAGNOSIS — D631 Anemia in chronic kidney disease: Secondary | ICD-10-CM | POA: Diagnosis not present

## 2022-04-23 DIAGNOSIS — R197 Diarrhea, unspecified: Secondary | ICD-10-CM | POA: Diagnosis not present

## 2022-04-23 DIAGNOSIS — N2581 Secondary hyperparathyroidism of renal origin: Secondary | ICD-10-CM | POA: Diagnosis not present

## 2022-04-23 DIAGNOSIS — N186 End stage renal disease: Secondary | ICD-10-CM | POA: Diagnosis not present

## 2022-04-23 DIAGNOSIS — L299 Pruritus, unspecified: Secondary | ICD-10-CM | POA: Diagnosis not present

## 2022-04-23 DIAGNOSIS — R52 Pain, unspecified: Secondary | ICD-10-CM | POA: Diagnosis not present

## 2022-04-24 DIAGNOSIS — E1122 Type 2 diabetes mellitus with diabetic chronic kidney disease: Secondary | ICD-10-CM | POA: Diagnosis not present

## 2022-04-24 DIAGNOSIS — Z992 Dependence on renal dialysis: Secondary | ICD-10-CM | POA: Diagnosis not present

## 2022-04-24 DIAGNOSIS — N186 End stage renal disease: Secondary | ICD-10-CM | POA: Diagnosis not present

## 2022-04-25 DIAGNOSIS — E1129 Type 2 diabetes mellitus with other diabetic kidney complication: Secondary | ICD-10-CM | POA: Diagnosis not present

## 2022-04-25 DIAGNOSIS — N2581 Secondary hyperparathyroidism of renal origin: Secondary | ICD-10-CM | POA: Diagnosis not present

## 2022-04-25 DIAGNOSIS — N186 End stage renal disease: Secondary | ICD-10-CM | POA: Diagnosis not present

## 2022-04-25 DIAGNOSIS — L299 Pruritus, unspecified: Secondary | ICD-10-CM | POA: Diagnosis not present

## 2022-04-25 DIAGNOSIS — R197 Diarrhea, unspecified: Secondary | ICD-10-CM | POA: Diagnosis not present

## 2022-04-25 DIAGNOSIS — Z992 Dependence on renal dialysis: Secondary | ICD-10-CM | POA: Diagnosis not present

## 2022-04-25 DIAGNOSIS — D631 Anemia in chronic kidney disease: Secondary | ICD-10-CM | POA: Diagnosis not present

## 2022-04-25 DIAGNOSIS — R52 Pain, unspecified: Secondary | ICD-10-CM | POA: Diagnosis not present

## 2022-04-25 NOTE — Progress Notes (Signed)
[facility] Note  04/26/2022     CHIEF COMPLAINT Patient presents for Retina Follow Up  HISTORY OF PRESENT ILLNESS: Jonathan Mcneil is a 67 y.o. male who presents to the clinic today for:   HPI     Retina Follow Up   In both eyes.  This started 4 weeks ago.  Duration of 4 weeks.  Since onset it is stable.  I, the attending physician,  performed the HPI with the patient and updated documentation appropriately.        Comments   4 week retina follow up PDR/DME OU IVA OU pt is reporting vision seems better than his visit he has noticed a few floaters some occasional floaters       Last edited by Bernarda Caffey, MD on 04/26/2022  5:23 PM.      Referring physician: Merrilee Seashore, MD [address] [place],  [place] [address]  HISTORICAL INFORMATION:   Selected notes from the MEDICAL RECORD NUMBER Referred by Dr. Maryjane Hurter for heme OD   CURRENT MEDICATIONS: Current Outpatient Medications (Ophthalmic Drugs)  Medication Sig   dorzolamide-timolol (COSOPT) 2-0.5 % ophthalmic solution Place 1 drop into both eyes 2 (two) times daily.   No current facility-administered medications for this visit. (Ophthalmic Drugs)   Current Outpatient Medications (Other)  Medication Sig   acetaminophen (TYLENOL) 650 MG CR tablet Take 1,300 mg by mouth daily as needed for pain.   ENTRESTO 24-26 MG Take 1 tablet by mouth 2 (two) times daily.   gabapentin (NEURONTIN) 300 MG capsule Take 300 mg by mouth at bedtime.   loperamide (IMODIUM) 2 MG capsule Take 1 capsule (2 mg total) by mouth 4 (four) times daily as needed for diarrhea or loose stools.   ondansetron (ZOFRAN) 4 MG tablet Take 1 tablet (4 mg total) by mouth every 8 (eight) hours as needed for nausea or vomiting.   ondansetron (ZOFRAN-ODT) 4 MG disintegrating tablet Take 1 tablet (4 mg total) by mouth every 8 (eight) hours as needed for nausea or vomiting.   pantoprazole (PROTONIX) 40 MG tablet  Take 1 tablet (40 mg total) by mouth 2 (two) times daily before a meal. Twice a day x 4 weeks, then daily   sertraline (ZOLOFT) 25 MG tablet Take 25 mg by mouth daily as needed (depression).   sucroferric oxyhydroxide (VELPHORO) 500 MG chewable tablet Chew 1,500 mg by mouth 3 (three) times daily with meals.   No current facility-administered medications for this visit. (Other)   REVIEW OF SYSTEMS: ROS   Positive for: Gastrointestinal, Neurological, Genitourinary, Endocrine, Eyes Negative for: Constitutional, Skin, Musculoskeletal, HENT, Cardiovascular, Respiratory, Psychiatric, Allergic/Imm, Heme/Lymph Last edited by Parthenia Ames, COT on 04/26/2022  1:23 PM.     ALLERGIES Allergies  Allergen Reactions   Gabapentin Nausea Only   Ibuprofen Hives   Penicillins Itching and Other (See Comments)    Has patient had a PCN reaction causing immediate rash, facial/tongue/throat swelling, SOB or lightheadedness with hypotension: No Has patient had a PCN reaction causing severe rash involving mucus membranes or skin necrosis: No Has patient had a PCN reaction that required hospitalization No Has patient had a PCN reaction occurring within the last 10 years: Unknown If all of the above answers are "NO", then may proceed with Cephalosporin use.   Venlafaxine Other (See Comments)    insomnia   PAST MEDICAL HISTORY Past Medical History:  Diagnosis Date   Cataract    Diabetes mellitus  without complication (HCC)    Diabetic retinopathy ([place])    ESRD (end stage renal disease) ([place])    GERD (gastroesophageal reflux disease)    PMH   Hypertension    Hypertensive retinopathy    Low back pain    Lung disease    Metabolic acidosis    Neuromuscular disorder ([place])    peripheral neuropathy   Pancreatitis    Schizophrenia ([place])    does not take medications   Past Surgical History:  Procedure Laterality Date   A/V FISTULAGRAM Left 06/03/2018   Procedure: A/V FISTULAGRAM;  Surgeon: Marty Heck, MD;  Location: [facility];  Service: Cardiovascular;  Laterality: Left;   AV FISTULA PLACEMENT Left 02/11/2018   Procedure: INSERTION OF ARTERIOVENOUS (AV) FISTULA LEFT  ARM;  Surgeon: [name], MD;  Location: [facility];  Service: Vascular;  Laterality: Left;   [place] Left 04/17/2018   Procedure: BASILIC VEIN TRANSPOSITION SECOND STAGE;  Surgeon: [name], MD;  Location: [facility];  Service: Vascular;  Laterality: Left;   BIOPSY  10/20/2019   Procedure: BIOPSY;  Surgeon: Clarene Essex, MD;  Location: [facility];  Service: Endoscopy;;   COLONOSCOPY     DENTAL SURGERY     ESOPHAGOGASTRODUODENOSCOPY N/A 06/16/2021   Procedure: ESOPHAGOGASTRODUODENOSCOPY (EGD);  Surgeon: Wilford Corner, MD;  Location: [facility];  Service: Gastroenterology;  Laterality: N/A;   ESOPHAGOGASTRODUODENOSCOPY (EGD) WITH PROPOFOL N/A 10/20/2019   Procedure: ESOPHAGOGASTRODUODENOSCOPY (EGD) WITH PROPOFOL;  Surgeon: Clarene Essex, MD;  Location: [place] [facility];  Service: Endoscopy;  Laterality: N/A;   INSERTION OF DIALYSIS CATHETER Right 02/25/2018   Procedure: INSERTION OF DIALYSIS CATHETER;  Surgeon: [name], MD;  Location: [facility];  Service: Vascular;  Laterality: Right;   PERIPHERAL VASCULAR BALLOON ANGIOPLASTY  06/03/2018   Procedure: PERIPHERAL VASCULAR BALLOON ANGIOPLASTY;  Surgeon: Marty Heck, MD;  Location: [facility];  Service: Cardiovascular;;  left a/v fistula   FAMILY HISTORY Family History  Problem Relation Age of Onset   Kidney failure Mother    Diabetes Father    Blindness Brother    SOCIAL HISTORY Social History   Tobacco Use   Smoking status: Former   Smokeless tobacco: Never  Scientific laboratory technician Use: Never used  Substance Use Topics   Alcohol use: No   Drug use: Not Currently    Types: Cocaine, Marijuana    Comment: smoked marijuana a few days ago; he denies using cocaine       OPHTHALMIC  EXAM:  Base Eye Exam     Visual Acuity (Snellen - Linear)       Right Left   Dist [place] 20/20 -1 20/20         Tonometry (Tonopen, 1:27 PM)       Right Left   Pressure 15 16         Pupils       Pupils Dark Light Shape React APD   Right PERRL 2 2 Round Minimal None   Left PERRL 2 2 Round Minimal None         Visual Fields       Left Right    Full Full         Extraocular Movement       Right Left    Full, Ortho Full, Ortho         Neuro/Psych     Oriented x3: Yes   Mood/Affect: Normal  Dilation     Both eyes: 2.5% Phenylephrine @ 1:27 PM           Slit Lamp and Fundus Exam     Slit Lamp Exam       Right Left   Lids/Lashes Dermatochalasis - upper lid Dermatochalasis - upper lid   Conjunctiva/Sclera Melanosis Melanosis, temporal pinguecula   Cornea trace Punctate epithelial erosions, mild arcus, mild tear film debris trace Punctate epithelial erosions, mild arcus, mild tear film debris   Anterior Chamber Deep and quiet Deep and quiet   Iris Round and dilated, No NVI Round and poorly dilated, No NVI   Lens 2-3+ Nuclear sclerosis, 2-3+ Cortical cataract 2-3+ Nuclear sclerosis, 2-3+ Cortical cataract   Anterior Vitreous Vitreous syneresis, residual white VH settled inferiorly, new blood stained vitreous condensations over superior macula Vitreous syneresis, blood stained vitreous condensations - improved, mild blood clots settling inferiorly and improving, Posterior vitreous detachment         Fundus Exam       Right Left   Disc Mild Pallor, Sharp rim, focal PPP/PPA Mild Pallor, Sharp rim   C/D Ratio 0.4 0.5   Macula Flat, Good foveal reflex, RPE mottling and clumping, cystic changes temporal macula improved, trace MA, + ERM Flat, good foveal reflex, ERM, RPE mottling and clumping, scattered Microaneurysms/DBH greatest temporal macula, trace cystic changes temporal macula -- slightly improved   Vessels attenuated, mild Tortuosity  attenuated, mild tortuosity   Periphery Attached, 360 IRH/DBH - improved, pre-retinal heme settled inferiorly, 360 PRP with room for fill in Attached, scattered IRH/DBH greatest nasally, good 360 PRP changes with room for fill in, red blood clots inferiorly now white, No RT/RD           IMAGING AND PROCEDURES  Imaging and Procedures for 04/26/2022  OCT, Retina - OU - Both Eyes       Right Eye Quality was good. Central Foveal Thickness: 274. Progression has improved. Findings include no SRF, abnormal foveal contour, intraretinal hyper-reflective material, epiretinal membrane, intraretinal fluid (Interval improvement in vitreous opacities and cystic changes / edema, mild EMR).   Left Eye Quality was good. Central Foveal Thickness: 313. Progression has improved. Findings include abnormal foveal contour, intraretinal hyper-reflective material, epiretinal membrane, intraretinal fluid, subretinal fluid (persistent focal pockets of SRF IN periphery - caught on widefield, ERM with blunted foveal contour, trace persistent cystic changes temporal macula).   Notes *Images captured and stored on drive  Diagnosis / Impression:  OD: Interval improvement in vitreous opacities and cystic changes / edema, mild EMR OS: persistent focal pockets of SRF IN periphery - caught on widefield, ERM with blunted foveal contour, trace persistent cystic changes temporal macula  Clinical management:  See below  Abbreviations: NFP - Normal foveal profile. CME - cystoid macular edema. PED - pigment epithelial detachment. IRF - intraretinal fluid. SRF - subretinal fluid. EZ - ellipsoid zone. ERM - epiretinal membrane. ORA - outer retinal atrophy. ORT - outer retinal tubulation. SRHM - subretinal hyper-reflective material. IRHM - intraretinal hyper-reflective material      Intravitreal Injection, Pharmacologic Agent - OD - Right Eye       Time Out 04/26/2022. 2:00 PM. Confirmed correct patient, procedure, site, and  patient consented.   Anesthesia Topical anesthesia was used. Anesthetic medications included Lidocaine 2%, Proparacaine 0.5%.   Procedure Preparation included 5% betadine to ocular surface, eyelid speculum. A (32g) needle was used.   Injection: 1.25 mg Bevacizumab 1.25mg /0.30ml   Route: Intravitreal, Site: Right Eye  [place]: [id number], Lot: [id number], Expiration date: 06/15/2022   Post-op Post injection exam found visual acuity of at least counting fingers. The patient tolerated the procedure well. There were no complications. The patient received written and verbal post procedure care education. Post injection medications were not given.             ASSESSMENT/PLAN:   ICD-10-CM   1. 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 Vitreous hemorrhage of left eye (HCC)  H43.12     3. Vitreous hemorrhage, right eye (HCC)  H43.11 Intravitreal Injection, Pharmacologic Agent - OD - Right Eye    Bevacizumab (AVASTIN) SOLN 1.25 mg    4. Posterior vitreous detachment of left eye  H43.812     5. Essential hypertension  I10     6. Hypertensive retinopathy of both eyes  H35.033     7. Epiretinal membrane (ERM) of left eye  H35.372     8. Combined forms of age-related cataract of both eyes  H25.813     9. Bilateral ocular hypertension  H40.053      1-3. Proliferative diabetic retinopathy w/ recurrent VH OU (OD > OS) - s/p IVA OD #1 (12.08.21), #2 (01.11.22), #3 (02.09.22), #4 (11.04.22), #5 (12.02.22), #6 (12.30.22), #7 (01.27.23), #8 (01.05.24) - s/p IVA OS #1 (12.10.21), #2 (01.11.22), #3 (02.09.22), #4 (09.09.22), #5 (10.07.22), #6 (11.04.22), #7 (12.02.22), #8 (12.30.22) - FA (12.08.21) shows late-leaking MA, vascular nonperfusion, +NV OU (nasal midzone) - s/p PRP OD  (01.26.22) - s/p PRP OS (03.09.22) - BCVA 20/25 OD, 20/20 OS - stable OU - exam shows interval improvement in VH OD; OS with blood stained vit condensations improving from hemorrhagic PVD, and blood clots settled inferiorly  - OCT shows OD: Interval improvement in vitreous opacities and cystic changes / edema, mild EMR; OS: persistent focal pockets of SRF IN periphery - caught on widefield, ERM with blunted foveal contour, trace persistent cystic changes temporal macula  - recommend IVA OD #9 today, 02.02.24  - pt wishes to proceed with injection  - RBA of procedure discussed, questions answered - see procedure note  - repeat FA 6.6.22 shows interval improvement in NVE/leakage OU s/p PRP OU-- just minimal leakage remains   - patient may re-start heparin - IVA informed consent obtained and signed - see procedure note - f/u as scheduled, DFE, OCT, possible injections  4. Hemorrhagic PVD OS  - Onset mid-August 2022 w/ new onset floaters, no photopsias  - s/p IVA OS #4 (09.09.22), #5 (10.07.22), #6 (11.04.22), #7 (12.02.22), #8 (12.30.22) #9 01.05.24 - today, VA stably improved to 20/20 OS from 20/40 and 20/80 prior - pt states they have been holding heparin at dialysis - exam shows interval improvement in vitreous heme -- clearing and settling inferiorly - pt reports previously receiving heparin during dialysis (T, Th, Sat) -- may have contributed to interval worsening   - Discussed findings and prognosis  - No RT or RD on 360 peripheral exam  - Reviewed s/s of RT/RD  - Strict return precautions for any such RT/RD signs/symptoms  - VH precautions reviewed -- minimize activities, keep head elevated, hold heparin if able, avoid ASA/NSAIDS  5,6. Hypertensive retinopathy OU - discussed importance of tight BP control - monitor  7. Epiretinal  membrane, left eye  - mild ERM - BCVA 20/20 - asymptomatic, no metamorphopsia - no indication for surgery at this time - monitor for now - f/u 3  mos -- DFE/OCT  8. Mixed Cataract OU - The symptoms of cataract, surgical options, and treatments and risks were discussed with patient - discussed diagnosis and progression - approaching visual significance - monitor  9. Ocular Hypertension OU  - IOP today: 24,21  - patient stopped using cosopt--need to re-start cosopt bid OU    Ophthalmic Meds Ordered this visit:  Meds ordered this encounter  Medications   Bevacizumab (AVASTIN) SOLN 1.25 mg     Return in about 6 weeks (around 06/07/2022) for f/u PDR OU, DFE, OCT.  There are no Patient Instructions on file for this visit.  This document serves as a record of services personally performed by Gardiner Sleeper, MD, PhD. It was created on their behalf by Joetta Manners COT, an ophthalmic technician. The creation of this record is the provider's dictation and/or activities during the visit.    Electronically signed by: Joetta Manners COT 02.01.245:31 PM  This document serves as a record of services personally performed by Gardiner Sleeper, MD, PhD. It was created on their behalf by San Jetty. Owens Shark, OA an ophthalmic technician. The creation of this record is the provider's dictation and/or activities during the visit.    Electronically signed by: San Jetty. Marguerita Merles 02.02.2024 5:31 PM  Gardiner Sleeper, M.D., Ph.D. Diseases & Surgery of the Retina and [place] 04/26/2022   I have reviewed the above documentation for accuracy and completeness, and I agree with the above. Gardiner Sleeper, M.D., Ph.D. 04/26/22 5:36 PM   .Abbreviations: M myopia (nearsighted); A astigmatism; H hyperopia (farsighted); P presbyopia; Mrx spectacle prescription;  CTL contact lenses; OD right eye; OS left eye; OU both eyes  XT exotropia; ET esotropia; PEK punctate epithelial keratitis; PEE punctate epithelial erosions; DES dry eye syndrome; MGD meibomian gland dysfunction; ATs artificial tears; PFAT's preservative  free artificial tears; [place] nuclear sclerotic cataract; PSC posterior subcapsular cataract; ERM epi-retinal membrane; PVD posterior vitreous detachment; RD retinal detachment; DM diabetes mellitus; DR diabetic retinopathy; NPDR non-proliferative diabetic retinopathy; PDR proliferative diabetic retinopathy; CSME clinically significant macular edema; DME diabetic macular edema; dbh dot blot hemorrhages; CWS cotton wool spot; POAG primary open angle glaucoma; C/D cup-to-disc ratio; HVF humphrey visual field; GVF goldmann visual field; OCT optical coherence tomography; IOP intraocular pressure; BRVO Branch retinal vein occlusion; CRVO central retinal vein occlusion; CRAO central retinal artery occlusion; BRAO branch retinal artery occlusion; RT retinal tear; SB scleral buckle; PPV pars plana vitrectomy; VH Vitreous hemorrhage; PRP panretinal laser photocoagulation; IVK intravitreal kenalog; VMT vitreomacular traction; MH Macular hole;  NVD neovascularization of the disc; NVE neovascularization elsewhere; AREDS age related eye disease study; ARMD age related macular degeneration; POAG primary open angle glaucoma; EBMD epithelial/anterior basement membrane dystrophy; ACIOL anterior chamber intraocular lens; IOL intraocular lens; PCIOL posterior chamber intraocular lens; Phaco/IOL phacoemulsification with intraocular lens placement; [place] photorefractive keratectomy; LASIK laser assisted in situ keratomileusis; HTN hypertension; DM diabetes mellitus; COPD chronic obstructive pulmonary disease

## 2022-04-26 ENCOUNTER — Ambulatory Visit (INDEPENDENT_AMBULATORY_CARE_PROVIDER_SITE_OTHER): Payer: Medicare Other | Admitting: Ophthalmology

## 2022-04-26 ENCOUNTER — Encounter (INDEPENDENT_AMBULATORY_CARE_PROVIDER_SITE_OTHER): Payer: Self-pay | Admitting: Ophthalmology

## 2022-04-26 DIAGNOSIS — H35033 Hypertensive retinopathy, bilateral: Secondary | ICD-10-CM

## 2022-04-26 DIAGNOSIS — H35372 Puckering of macula, left eye: Secondary | ICD-10-CM | POA: Diagnosis not present

## 2022-04-26 DIAGNOSIS — H4311 Vitreous hemorrhage, right eye: Secondary | ICD-10-CM

## 2022-04-26 DIAGNOSIS — E113513 Type 2 diabetes mellitus with proliferative diabetic retinopathy with macular edema, bilateral: Secondary | ICD-10-CM

## 2022-04-26 DIAGNOSIS — H40053 Ocular hypertension, bilateral: Secondary | ICD-10-CM

## 2022-04-26 DIAGNOSIS — H4312 Vitreous hemorrhage, left eye: Secondary | ICD-10-CM

## 2022-04-26 DIAGNOSIS — H43812 Vitreous degeneration, left eye: Secondary | ICD-10-CM | POA: Diagnosis not present

## 2022-04-26 DIAGNOSIS — I10 Essential (primary) hypertension: Secondary | ICD-10-CM | POA: Diagnosis not present

## 2022-04-26 DIAGNOSIS — H4313 Vitreous hemorrhage, bilateral: Secondary | ICD-10-CM

## 2022-04-26 DIAGNOSIS — E113593 Type 2 diabetes mellitus with proliferative diabetic retinopathy without macular edema, bilateral: Secondary | ICD-10-CM

## 2022-04-26 DIAGNOSIS — H25813 Combined forms of age-related cataract, bilateral: Secondary | ICD-10-CM

## 2022-04-26 MED ORDER — BEVACIZUMAB CHEMO INJECTION 1.25MG/0.05ML SYRINGE FOR KALEIDOSCOPE
1.2500 mg | INTRAVITREAL | Status: AC | PRN
  Administered 2022-04-26: 1.25 mg via INTRAVITREAL

## 2022-04-27 DIAGNOSIS — N186 End stage renal disease: Secondary | ICD-10-CM | POA: Diagnosis not present

## 2022-04-27 DIAGNOSIS — N2581 Secondary hyperparathyroidism of renal origin: Secondary | ICD-10-CM | POA: Diagnosis not present

## 2022-04-27 DIAGNOSIS — R197 Diarrhea, unspecified: Secondary | ICD-10-CM | POA: Diagnosis not present

## 2022-04-27 DIAGNOSIS — R52 Pain, unspecified: Secondary | ICD-10-CM | POA: Diagnosis not present

## 2022-04-27 DIAGNOSIS — L299 Pruritus, unspecified: Secondary | ICD-10-CM | POA: Diagnosis not present

## 2022-04-27 DIAGNOSIS — E1129 Type 2 diabetes mellitus with other diabetic kidney complication: Secondary | ICD-10-CM | POA: Diagnosis not present

## 2022-04-27 DIAGNOSIS — Z992 Dependence on renal dialysis: Secondary | ICD-10-CM | POA: Diagnosis not present

## 2022-04-27 DIAGNOSIS — D631 Anemia in chronic kidney disease: Secondary | ICD-10-CM | POA: Diagnosis not present

## 2022-04-29 ENCOUNTER — Encounter: Payer: Self-pay | Admitting: Podiatry

## 2022-04-30 DIAGNOSIS — L299 Pruritus, unspecified: Secondary | ICD-10-CM | POA: Diagnosis not present

## 2022-04-30 DIAGNOSIS — Z992 Dependence on renal dialysis: Secondary | ICD-10-CM | POA: Diagnosis not present

## 2022-04-30 DIAGNOSIS — R52 Pain, unspecified: Secondary | ICD-10-CM | POA: Diagnosis not present

## 2022-04-30 DIAGNOSIS — N186 End stage renal disease: Secondary | ICD-10-CM | POA: Diagnosis not present

## 2022-04-30 DIAGNOSIS — N2581 Secondary hyperparathyroidism of renal origin: Secondary | ICD-10-CM | POA: Diagnosis not present

## 2022-04-30 DIAGNOSIS — D631 Anemia in chronic kidney disease: Secondary | ICD-10-CM | POA: Diagnosis not present

## 2022-04-30 DIAGNOSIS — E1129 Type 2 diabetes mellitus with other diabetic kidney complication: Secondary | ICD-10-CM | POA: Diagnosis not present

## 2022-04-30 DIAGNOSIS — R197 Diarrhea, unspecified: Secondary | ICD-10-CM | POA: Diagnosis not present

## 2022-05-02 DIAGNOSIS — R52 Pain, unspecified: Secondary | ICD-10-CM | POA: Diagnosis not present

## 2022-05-02 DIAGNOSIS — N2581 Secondary hyperparathyroidism of renal origin: Secondary | ICD-10-CM | POA: Diagnosis not present

## 2022-05-02 DIAGNOSIS — R197 Diarrhea, unspecified: Secondary | ICD-10-CM | POA: Diagnosis not present

## 2022-05-02 DIAGNOSIS — L299 Pruritus, unspecified: Secondary | ICD-10-CM | POA: Diagnosis not present

## 2022-05-02 DIAGNOSIS — D631 Anemia in chronic kidney disease: Secondary | ICD-10-CM | POA: Diagnosis not present

## 2022-05-02 DIAGNOSIS — E1129 Type 2 diabetes mellitus with other diabetic kidney complication: Secondary | ICD-10-CM | POA: Diagnosis not present

## 2022-05-02 DIAGNOSIS — N186 End stage renal disease: Secondary | ICD-10-CM | POA: Diagnosis not present

## 2022-05-02 DIAGNOSIS — Z992 Dependence on renal dialysis: Secondary | ICD-10-CM | POA: Diagnosis not present

## 2022-05-04 DIAGNOSIS — R52 Pain, unspecified: Secondary | ICD-10-CM | POA: Diagnosis not present

## 2022-05-04 DIAGNOSIS — D631 Anemia in chronic kidney disease: Secondary | ICD-10-CM | POA: Diagnosis not present

## 2022-05-04 DIAGNOSIS — Z992 Dependence on renal dialysis: Secondary | ICD-10-CM | POA: Diagnosis not present

## 2022-05-04 DIAGNOSIS — N2581 Secondary hyperparathyroidism of renal origin: Secondary | ICD-10-CM | POA: Diagnosis not present

## 2022-05-04 DIAGNOSIS — E1129 Type 2 diabetes mellitus with other diabetic kidney complication: Secondary | ICD-10-CM | POA: Diagnosis not present

## 2022-05-04 DIAGNOSIS — L299 Pruritus, unspecified: Secondary | ICD-10-CM | POA: Diagnosis not present

## 2022-05-04 DIAGNOSIS — R197 Diarrhea, unspecified: Secondary | ICD-10-CM | POA: Diagnosis not present

## 2022-05-04 DIAGNOSIS — N186 End stage renal disease: Secondary | ICD-10-CM | POA: Diagnosis not present

## 2022-05-07 DIAGNOSIS — R52 Pain, unspecified: Secondary | ICD-10-CM | POA: Diagnosis not present

## 2022-05-07 DIAGNOSIS — R197 Diarrhea, unspecified: Secondary | ICD-10-CM | POA: Diagnosis not present

## 2022-05-07 DIAGNOSIS — E1129 Type 2 diabetes mellitus with other diabetic kidney complication: Secondary | ICD-10-CM | POA: Diagnosis not present

## 2022-05-07 DIAGNOSIS — D631 Anemia in chronic kidney disease: Secondary | ICD-10-CM | POA: Diagnosis not present

## 2022-05-07 DIAGNOSIS — N2581 Secondary hyperparathyroidism of renal origin: Secondary | ICD-10-CM | POA: Diagnosis not present

## 2022-05-07 DIAGNOSIS — N186 End stage renal disease: Secondary | ICD-10-CM | POA: Diagnosis not present

## 2022-05-07 DIAGNOSIS — Z992 Dependence on renal dialysis: Secondary | ICD-10-CM | POA: Diagnosis not present

## 2022-05-07 DIAGNOSIS — L299 Pruritus, unspecified: Secondary | ICD-10-CM | POA: Diagnosis not present

## 2022-05-09 DIAGNOSIS — D631 Anemia in chronic kidney disease: Secondary | ICD-10-CM | POA: Diagnosis not present

## 2022-05-09 DIAGNOSIS — R197 Diarrhea, unspecified: Secondary | ICD-10-CM | POA: Diagnosis not present

## 2022-05-09 DIAGNOSIS — L299 Pruritus, unspecified: Secondary | ICD-10-CM | POA: Diagnosis not present

## 2022-05-09 DIAGNOSIS — N2581 Secondary hyperparathyroidism of renal origin: Secondary | ICD-10-CM | POA: Diagnosis not present

## 2022-05-09 DIAGNOSIS — E1129 Type 2 diabetes mellitus with other diabetic kidney complication: Secondary | ICD-10-CM | POA: Diagnosis not present

## 2022-05-09 DIAGNOSIS — N186 End stage renal disease: Secondary | ICD-10-CM | POA: Diagnosis not present

## 2022-05-09 DIAGNOSIS — Z992 Dependence on renal dialysis: Secondary | ICD-10-CM | POA: Diagnosis not present

## 2022-05-09 DIAGNOSIS — R52 Pain, unspecified: Secondary | ICD-10-CM | POA: Diagnosis not present

## 2022-05-11 DIAGNOSIS — D631 Anemia in chronic kidney disease: Secondary | ICD-10-CM | POA: Diagnosis not present

## 2022-05-11 DIAGNOSIS — R52 Pain, unspecified: Secondary | ICD-10-CM | POA: Diagnosis not present

## 2022-05-11 DIAGNOSIS — Z992 Dependence on renal dialysis: Secondary | ICD-10-CM | POA: Diagnosis not present

## 2022-05-11 DIAGNOSIS — L299 Pruritus, unspecified: Secondary | ICD-10-CM | POA: Diagnosis not present

## 2022-05-11 DIAGNOSIS — N2581 Secondary hyperparathyroidism of renal origin: Secondary | ICD-10-CM | POA: Diagnosis not present

## 2022-05-11 DIAGNOSIS — N186 End stage renal disease: Secondary | ICD-10-CM | POA: Diagnosis not present

## 2022-05-11 DIAGNOSIS — R197 Diarrhea, unspecified: Secondary | ICD-10-CM | POA: Diagnosis not present

## 2022-05-11 DIAGNOSIS — E1129 Type 2 diabetes mellitus with other diabetic kidney complication: Secondary | ICD-10-CM | POA: Diagnosis not present

## 2022-05-14 DIAGNOSIS — Z992 Dependence on renal dialysis: Secondary | ICD-10-CM | POA: Diagnosis not present

## 2022-05-14 DIAGNOSIS — R197 Diarrhea, unspecified: Secondary | ICD-10-CM | POA: Diagnosis not present

## 2022-05-14 DIAGNOSIS — N186 End stage renal disease: Secondary | ICD-10-CM | POA: Diagnosis not present

## 2022-05-14 DIAGNOSIS — E1129 Type 2 diabetes mellitus with other diabetic kidney complication: Secondary | ICD-10-CM | POA: Diagnosis not present

## 2022-05-14 DIAGNOSIS — R52 Pain, unspecified: Secondary | ICD-10-CM | POA: Diagnosis not present

## 2022-05-14 DIAGNOSIS — L299 Pruritus, unspecified: Secondary | ICD-10-CM | POA: Diagnosis not present

## 2022-05-14 DIAGNOSIS — D631 Anemia in chronic kidney disease: Secondary | ICD-10-CM | POA: Diagnosis not present

## 2022-05-14 DIAGNOSIS — N2581 Secondary hyperparathyroidism of renal origin: Secondary | ICD-10-CM | POA: Diagnosis not present

## 2022-05-16 DIAGNOSIS — L299 Pruritus, unspecified: Secondary | ICD-10-CM | POA: Diagnosis not present

## 2022-05-16 DIAGNOSIS — Z992 Dependence on renal dialysis: Secondary | ICD-10-CM | POA: Diagnosis not present

## 2022-05-16 DIAGNOSIS — N186 End stage renal disease: Secondary | ICD-10-CM | POA: Diagnosis not present

## 2022-05-16 DIAGNOSIS — R197 Diarrhea, unspecified: Secondary | ICD-10-CM | POA: Diagnosis not present

## 2022-05-16 DIAGNOSIS — D631 Anemia in chronic kidney disease: Secondary | ICD-10-CM | POA: Diagnosis not present

## 2022-05-16 DIAGNOSIS — R52 Pain, unspecified: Secondary | ICD-10-CM | POA: Diagnosis not present

## 2022-05-16 DIAGNOSIS — E1129 Type 2 diabetes mellitus with other diabetic kidney complication: Secondary | ICD-10-CM | POA: Diagnosis not present

## 2022-05-16 DIAGNOSIS — N2581 Secondary hyperparathyroidism of renal origin: Secondary | ICD-10-CM | POA: Diagnosis not present

## 2022-05-17 ENCOUNTER — Telehealth: Payer: Self-pay | Admitting: Podiatry

## 2022-05-17 NOTE — Telephone Encounter (Signed)
Left message on vm, patient wife called checking on his diabetic shoes, they should be delivered by Monday.  Ok to set up appt for end of next week

## 2022-05-18 DIAGNOSIS — D631 Anemia in chronic kidney disease: Secondary | ICD-10-CM | POA: Diagnosis not present

## 2022-05-18 DIAGNOSIS — L299 Pruritus, unspecified: Secondary | ICD-10-CM | POA: Diagnosis not present

## 2022-05-18 DIAGNOSIS — N186 End stage renal disease: Secondary | ICD-10-CM | POA: Diagnosis not present

## 2022-05-18 DIAGNOSIS — N2581 Secondary hyperparathyroidism of renal origin: Secondary | ICD-10-CM | POA: Diagnosis not present

## 2022-05-18 DIAGNOSIS — Z992 Dependence on renal dialysis: Secondary | ICD-10-CM | POA: Diagnosis not present

## 2022-05-18 DIAGNOSIS — E1129 Type 2 diabetes mellitus with other diabetic kidney complication: Secondary | ICD-10-CM | POA: Diagnosis not present

## 2022-05-18 DIAGNOSIS — R197 Diarrhea, unspecified: Secondary | ICD-10-CM | POA: Diagnosis not present

## 2022-05-18 DIAGNOSIS — R52 Pain, unspecified: Secondary | ICD-10-CM | POA: Diagnosis not present

## 2022-05-21 ENCOUNTER — Emergency Department (HOSPITAL_COMMUNITY)
Admission: EM | Admit: 2022-05-21 | Discharge: 2022-05-21 | Disposition: A | Discharge status: Home / Self Care | Payer: Medicare Other | Attending: Emergency Medicine | Admitting: Emergency Medicine

## 2022-05-21 ENCOUNTER — Emergency Department (HOSPITAL_COMMUNITY): Payer: Medicare Other

## 2022-05-21 ENCOUNTER — Other Ambulatory Visit: Payer: Self-pay

## 2022-05-21 DIAGNOSIS — K7689 Other specified diseases of liver: Secondary | ICD-10-CM | POA: Diagnosis not present

## 2022-05-21 DIAGNOSIS — D72829 Elevated white blood cell count, unspecified: Secondary | ICD-10-CM | POA: Diagnosis not present

## 2022-05-21 DIAGNOSIS — R197 Diarrhea, unspecified: Secondary | ICD-10-CM | POA: Diagnosis not present

## 2022-05-21 DIAGNOSIS — I120 Hypertensive chronic kidney disease with stage 5 chronic kidney disease or end stage renal disease: Secondary | ICD-10-CM | POA: Diagnosis not present

## 2022-05-21 DIAGNOSIS — Z992 Dependence on renal dialysis: Secondary | ICD-10-CM | POA: Diagnosis not present

## 2022-05-21 DIAGNOSIS — N186 End stage renal disease: Secondary | ICD-10-CM | POA: Diagnosis not present

## 2022-05-21 DIAGNOSIS — R1084 Generalized abdominal pain: Secondary | ICD-10-CM | POA: Diagnosis not present

## 2022-05-21 DIAGNOSIS — K828 Other specified diseases of gallbladder: Secondary | ICD-10-CM | POA: Diagnosis not present

## 2022-05-21 DIAGNOSIS — R109 Unspecified abdominal pain: Secondary | ICD-10-CM | POA: Diagnosis present

## 2022-05-21 DIAGNOSIS — E1122 Type 2 diabetes mellitus with diabetic chronic kidney disease: Secondary | ICD-10-CM | POA: Diagnosis not present

## 2022-05-21 DIAGNOSIS — R112 Nausea with vomiting, unspecified: Secondary | ICD-10-CM | POA: Diagnosis not present

## 2022-05-21 LAB — COMPREHENSIVE METABOLIC PANEL
ALT: 15 U/L (ref 0–44)
AST: 20 U/L (ref 15–41)
Albumin: 4 g/dL (ref 3.5–5.0)
Alkaline Phosphatase: 156 U/L — ABNORMAL HIGH (ref 38–126)
Anion gap: 16 — ABNORMAL HIGH (ref 5–15)
BUN: 41 mg/dL — ABNORMAL HIGH (ref 8–23)
CO2: 26 mmol/L (ref 22–32)
Calcium: 10 mg/dL (ref 8.9–10.3)
Chloride: 93 mmol/L — ABNORMAL LOW (ref 98–111)
Creatinine, Ser: 9.61 mg/dL — ABNORMAL HIGH (ref 0.61–1.24)
GFR, Estimated: 6 mL/min — ABNORMAL LOW (ref >60)
Glucose, Bld: 118 mg/dL — ABNORMAL HIGH (ref 70–99)
Potassium: 5 mmol/L (ref 3.5–5.1)
Sodium: 135 mmol/L (ref 135–145)
Total Bilirubin: 1.3 mg/dL — ABNORMAL HIGH (ref 0.3–1.2)
Total Protein: 8.2 g/dL — ABNORMAL HIGH (ref 6.5–8.1)

## 2022-05-21 LAB — CBC
HCT: 41.8 % (ref 39.0–52.0)
Hemoglobin: 13.4 g/dL (ref 13.0–17.0)
MCH: 31.5 pg (ref 26.0–34.0)
MCHC: 32.1 g/dL (ref 30.0–36.0)
MCV: 98.1 fL (ref 80.0–100.0)
Platelets: 110 10*3/uL — ABNORMAL LOW (ref 150–400)
RBC: 4.26 MIL/uL (ref 4.22–5.81)
RDW: 15.7 % — ABNORMAL HIGH (ref 11.5–15.5)
WBC: 5.2 10*3/uL (ref 4.0–10.5)
nRBC: 0 % (ref 0.0–0.2)

## 2022-05-21 LAB — LIPASE, BLOOD: Lipase: 75 U/L — ABNORMAL HIGH (ref 11–51)

## 2022-05-21 MED ORDER — ONDANSETRON HCL 4 MG/2ML IJ SOLN
4.0000 mg | Freq: Once | INTRAMUSCULAR | Status: AC
  Administered 2022-05-21: 4 mg via INTRAVENOUS
  Filled 2022-05-21: qty 2

## 2022-05-21 MED ORDER — ONDANSETRON 4 MG PO TBDP
4.0000 mg | ORAL_TABLET | Freq: Once | ORAL | Status: AC | PRN
  Administered 2022-05-21: 4 mg via ORAL
  Filled 2022-05-21: qty 1

## 2022-05-21 MED ORDER — IOHEXOL 350 MG/ML SOLN
75.0000 mL | Freq: Once | INTRAVENOUS | Status: AC | PRN
  Administered 2022-05-21: 75 mL via INTRAVENOUS

## 2022-05-21 NOTE — ED Notes (Signed)
Pt up to bathroom with stable gait.

## 2022-05-21 NOTE — ED Notes (Signed)
Attempted x3 to place PIV, all attempts unsuccessful request ODT Zofran from provider to treat nausea until PIV can be placed.

## 2022-05-21 NOTE — ED Notes (Signed)
Pt ambulated appropriately to restroom.

## 2022-05-21 NOTE — ED Notes (Signed)
This RN reviewed discharge instructions with patient and his wife via phone call. They verbalized understanding and denied any further questions. Pt well appearing upon discharge and reports tolerable pain in head. Pt wheeled to to exit. Pt endorses ride home by wife.

## 2022-05-21 NOTE — ED Provider Notes (Signed)
Jonathan Mcneil Provider Note   CSN: [id number] Arrival date & time: 05/21/22  [id number]     History Chief Complaint  Patient presents with   Abdominal Pain   Emesis   Diarrhea    HPI Jonathan Mcneil is a 67 y.o. male presenting for Abdominal pain.  He is a 67 year old male with an extensive comorbid medical history including diabetes, hypertension, ESRD on dialysis, history of chronic abdominal pain. He states that he had nonbilious nonbloody vomiting x 2 today.  He showed up to his dialysis appointment but vomited when they are trying to connect to him and they recommend he come the emergency department for reevaluation. He is otherwise ambulatory tolerating p.o. intake in no acute distress at this time. States that most of her symptoms have improved..   Patient's recorded medical, surgical, social, medication list and allergies were reviewed in the Snapshot window as part of the initial history.   Review of Systems   Review of Systems  Constitutional:  Negative for chills and fever.  HENT:  Negative for ear pain and sore throat.   Eyes:  Negative for pain and visual disturbance.  Respiratory:  Negative for cough and shortness of breath.   Cardiovascular:  Negative for chest pain and palpitations.  Gastrointestinal:  Positive for abdominal pain and nausea. Negative for vomiting.  Genitourinary:  Negative for dysuria and hematuria.  Musculoskeletal:  Negative for arthralgias and back pain.  Skin:  Negative for color change and rash.  Neurological:  Negative for seizures and syncope.  All other systems reviewed and are negative.   Physical Exam Updated Vital Signs BP (!) 148/84   Pulse (!) 57   Temp 97.7 F (36.5 C) (Oral)   Resp 20   SpO2 98%  Physical Exam Vitals and nursing note reviewed.  Constitutional:      General: He is not in acute distress.    Appearance: He is well-developed.  HENT:     Head: Normocephalic and  atraumatic.  Eyes:     Conjunctiva/sclera: Conjunctivae normal.  Cardiovascular:     Rate and Rhythm: Normal rate and regular rhythm.     Heart sounds: No murmur heard. Pulmonary:     Effort: Pulmonary effort is normal. No respiratory distress.     Breath sounds: Normal breath sounds.  Abdominal:     Palpations: Abdomen is soft.     Tenderness: There is no abdominal tenderness.  Musculoskeletal:        General: No swelling.     Cervical back: Neck supple.  Skin:    General: Skin is warm and dry.     Capillary Refill: Capillary refill takes less than 2 seconds.  Neurological:     Mental Status: He is alert.  Psychiatric:        Mood and Affect: Mood normal.      ED Course/ Medical Decision Making/ A&P Clinical Course as of 05/21/22 1411  Tue May 21, 2022  1359 Reassessment  [CC]    Clinical Course User Index [CC] Tretha Sciara, MD    Procedures Procedures   Medications Ordered in ED Medications  ondansetron (ZOFRAN-ODT) disintegrating tablet 4 mg (4 mg Oral Given 05/21/22 0716)  ondansetron (ZOFRAN) injection 4 mg (4 mg Intravenous Given 05/21/22 1147)  iohexol (OMNIPAQUE) 350 MG/ML injection 75 mL (75 mLs Intravenous Contrast Given 05/21/22 1313)    Medical Decision Making:    [name] is a 67 y.o. male who presented  to the ED today with a chief complaint nominal pain.  .     Patient's presentation is complicated by their history of multiple comorbid medical problems.  Patient placed on continuous vitals and telemetry monitoring while in ED which was reviewed periodically.   Complete initial physical exam performed, notably the patient  was hemodynamically stable in no acute distress.  He does have some generalized abdominal pain.      Reviewed and confirmed nursing documentation for past medical history, family history, social history.   Initial Assessment:   With the patient's presentation of abdominal pain, most likely diagnosis is nonspecific  cause. Other diagnoses were considered including (but not limited to) gastroenteritis, colitis, small bowel obstruction, appendicitis, cholecystitis, pancreatitis, nephrolithiasis, UTI, pyleonephritis, ruptured ectopic pregnancy, PID, ovarian/testicular torsion. These are considered less likely due to history of present illness and physical exam findings.   This is most consistent with an acute life/limb threatening illness complicated by underlying chronic conditions.   Initial Plan:  CBC/CMP to evaluate for underlying infectious/metabolic etiology for patient's abdominal pain  Lipase to evaluate for pancreatitis  CTAB/Pelvis with contrast to evaluate for structural/surgical etiology of patients' severe abdominal pain.  Urinalysis and repeat physical assessment to evaluate for UTI/Pyelonpehritis  Empiric management of symptoms with escalating pain control and antiemetics as needed.   Initial Study Results:   Laboratory  All laboratory results reviewed without evidence of clinically relevant pathology.    Radiology All images reviewed independently. Agree with radiology report at this time.   CT ABDOMEN PELVIS W CONTRAST  Result Date: 05/21/2022 CLINICAL DATA:  Abdominal pain with nausea, vomiting common diarrhea. EXAM: CT ABDOMEN AND PELVIS WITH CONTRAST TECHNIQUE: Multidetector CT imaging of the abdomen and pelvis was performed using the standard protocol following bolus administration of intravenous contrast. RADIATION DOSE REDUCTION: This exam was performed according to the departmental dose-optimization program which includes automated exposure control, adjustment of the mA and/or kV according to patient size and/or use of iterative reconstruction technique. CONTRAST:  54m OMNIPAQUE IOHEXOL 350 MG/ML SOLN COMPARISON:  CT abdomen/pelvis 10/23/2021. FINDINGS: Lower chest: The lung bases are clear. The heart is enlarged, unchanged. Hepatobiliary: Mild surface nodularity is again seen in the  liver (for example image 3-26). There are no focal liver lesions. The gallbladder wall is mildly thickened, nonspecific in the setting of ascites. There are no radiopaque gallstones. There is no biliary ductal dilatation. Pancreas: Unremarkable. Spleen: The 1.1 cm enhancing lesion in the spleen is unchanged, likely a benign hemangioma. Adrenals/Urinary Tract: The adrenals are unremarkable. The kidneys are atrophic, unchanged. There are no focal parenchymal lesions. There are no stones in either kidney or along the course of either ureter. There is no hydronephrosis or hydroureter. The bladder is decompressed. Stomach/Bowel: The stomach is unremarkable. There is no evidence of bowel obstruction. There is no abnormal bowel wall thickening or inflammatory change. The appendix is normal. Vascular/Lymphatic: There is calcified plaque in the nonaneurysmal abdominal aorta and branch vessels. The main portal and splenic veins are patent. There is reflux of contrast into the IVC/hepatic veins consistent with right heart dysfunction. Reproductive: The prostate and seminal vesicles are unremarkable. Other: There is small to moderate volume ascites particularly around the right hepatic lobe, increased in volume since the prior CT. There is no free intraperitoneal air. Musculoskeletal: Endplate irregularity at L2-L3 and L4-L5 is favored degenerative given stability. There is no acute osseous abnormality or suspicious osseous lesion. Metallic fragments within and adjacent to the left iliac bone are unchanged.  IMPRESSION: 1. Small to moderate volume ascites, increased in volume since 10/23/2021. 2. Mild gallbladder wall thickening is nonspecific in the setting of ascites. No radiopaque gallstones. If there is clinical concern for acute cholecystitis, HIDA may be considered. 3. Stable cardiomegaly with evidence of right heart dysfunction. 4. 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 Electronically Signed    By: Valetta Mole M.D.   On: 05/21/2022 13:35   OCT, Retina - OU - Both Eyes  Result Date: 04/26/2022 Right Eye Quality was good. Central Foveal Thickness: 274. Progression has improved. Findings include no SRF, abnormal foveal contour, intraretinal hyper-reflective material, epiretinal membrane, intraretinal fluid (Interval improvement in vitreous opacities and cystic changes / edema, mild EMR). Left Eye Quality was good. Central Foveal Thickness: 313. Progression has improved. Findings include abnormal foveal contour, intraretinal hyper-reflective material, epiretinal membrane, intraretinal fluid, subretinal fluid (persistent focal pockets of SRF IN periphery - caught on widefield, ERM with blunted foveal contour, trace persistent cystic changes temporal macula). Notes *Images captured and stored on drive Diagnosis / Impression: OD: Interval improvement in vitreous opacities and cystic changes / edema, mild EMR OS: persistent focal pockets of SRF IN periphery - caught on widefield, ERM with blunted foveal contour, trace persistent cystic changes temporal macula Clinical management: See below Abbreviations: NFP - Normal foveal profile. CME - cystoid macular edema. PED - pigment epithelial detachment. IRF - intraretinal fluid. SRF - subretinal fluid. EZ - ellipsoid zone. ERM - epiretinal membrane. ORA - outer retinal atrophy. ORT - outer retinal tubulation. SRHM - subretinal hyper-reflective material. IRHM - intraretinal hyper-reflective material   Intravitreal Injection, Pharmacologic Agent - OD - Right Eye  Result Date: 04/26/2022 Time Out 04/26/2022. 2:00 PM. Confirmed correct patient, procedure, site, and patient consented. Anesthesia Topical anesthesia was used. Anesthetic medications included Lidocaine 2%, Proparacaine 0.5%. Procedure Preparation included 5% betadine to ocular surface, eyelid speculum. A (32g) needle was used. Injection: 1.25 mg Bevacizumab 1.'25mg'$ /0.3m   Route: Intravitreal, Site: Right  Eye   NDC: [id number] Expiration date: 06/15/2022 Post-op Post injection exam found visual acuity of at least counting fingers. The patient tolerated the procedure well. There were no complications. The patient received written and verbal post procedure care education. Post injection medications were not given.      Final Reassessment and Plan:   Reevaluated patient at bedside.  Abdominal pain has resolved after administration of p.o. and IV Zofran.  He has been able to tolerate p.o. intake and feels comfortable discharge at this time.  Patient will plan to follow-up at dialysis clinic on Thursday. He denies fevers or chills, any continual nausea vomiting syncope shortness of breath.  Given lack of leukocytosis or evidence of hepatitis, acute cholecystitis is very grossly less likely especially in the setting of Gro symptomatic improvement.  Instructed patient to return should he develop a fever or worsening symptoms patient expressed understanding. Patient is requesting discharge and feels comfortable with outpatient care and management. Disposition:  I have considered need for hospitalization, however, considering all of the above, I believe this patient is stable for discharge at this time.  Patient/family educated about specific return precautions for given chief complaint and symptoms.  Patient/family educated about follow-up with PCP.     Patient explicitly told about  incidental findings and importance of PCP follow up.   Patient/family expressed understanding of return precautions and need for follow-up. Patient spoken to regarding all imaging and laboratory results and appropriate follow up for these results. All education  provided in verbal form with additional information in written form. Time was allowed for answering of patient questions. Patient discharged.    Emergency Department Medication Summary:   Medications  ondansetron (ZOFRAN-ODT) disintegrating tablet 4 mg  (4 mg Oral Given 05/21/22 0716)  ondansetron (ZOFRAN) injection 4 mg (4 mg Intravenous Given 05/21/22 1147)  iohexol (OMNIPAQUE) 350 MG/ML injection 75 mL (75 mLs Intravenous Contrast Given 05/21/22 1313)      Clinical Impression:  1. Generalized abdominal pain      Discharge   Final Clinical Impression(s) / ED Diagnoses Final diagnoses:  Generalized abdominal pain    Rx / DC Orders ED Discharge Orders     None         Tretha Sciara, MD 05/21/22 [phone]

## 2022-05-21 NOTE — ED Notes (Signed)
Provider, [place], at bedside.

## 2022-05-21 NOTE — ED Triage Notes (Signed)
Pt with generalized abdominal pain and n/v/d since waking up this morning. Emesis x 5 and diarrhea x 2. Arrived at dialysis for tx but was unable to complete any of tx d/t symptoms.

## 2022-05-23 ENCOUNTER — Ambulatory Visit (INDEPENDENT_AMBULATORY_CARE_PROVIDER_SITE_OTHER): Payer: Medicare Other

## 2022-05-23 DIAGNOSIS — M2021 Hallux rigidus, right foot: Secondary | ICD-10-CM

## 2022-05-23 DIAGNOSIS — E1149 Type 2 diabetes mellitus with other diabetic neurological complication: Secondary | ICD-10-CM

## 2022-05-23 DIAGNOSIS — N186 End stage renal disease: Secondary | ICD-10-CM | POA: Diagnosis not present

## 2022-05-23 DIAGNOSIS — E1122 Type 2 diabetes mellitus with diabetic chronic kidney disease: Secondary | ICD-10-CM | POA: Diagnosis not present

## 2022-05-23 DIAGNOSIS — R52 Pain, unspecified: Secondary | ICD-10-CM | POA: Diagnosis not present

## 2022-05-23 DIAGNOSIS — Z992 Dependence on renal dialysis: Secondary | ICD-10-CM | POA: Diagnosis not present

## 2022-05-23 DIAGNOSIS — E1129 Type 2 diabetes mellitus with other diabetic kidney complication: Secondary | ICD-10-CM | POA: Diagnosis not present

## 2022-05-23 DIAGNOSIS — D631 Anemia in chronic kidney disease: Secondary | ICD-10-CM | POA: Diagnosis not present

## 2022-05-23 DIAGNOSIS — R197 Diarrhea, unspecified: Secondary | ICD-10-CM | POA: Diagnosis not present

## 2022-05-23 DIAGNOSIS — N2581 Secondary hyperparathyroidism of renal origin: Secondary | ICD-10-CM | POA: Diagnosis not present

## 2022-05-23 DIAGNOSIS — L299 Pruritus, unspecified: Secondary | ICD-10-CM | POA: Diagnosis not present

## 2022-05-27 DIAGNOSIS — R109 Unspecified abdominal pain: Secondary | ICD-10-CM | POA: Diagnosis not present

## 2022-05-28 ENCOUNTER — Other Ambulatory Visit (HOSPITAL_COMMUNITY): Payer: Self-pay | Admitting: Internal Medicine

## 2022-05-28 DIAGNOSIS — Z992 Dependence on renal dialysis: Secondary | ICD-10-CM | POA: Diagnosis not present

## 2022-05-28 DIAGNOSIS — N186 End stage renal disease: Secondary | ICD-10-CM | POA: Diagnosis not present

## 2022-05-28 DIAGNOSIS — N2581 Secondary hyperparathyroidism of renal origin: Secondary | ICD-10-CM | POA: Diagnosis not present

## 2022-05-28 DIAGNOSIS — R52 Pain, unspecified: Secondary | ICD-10-CM | POA: Diagnosis not present

## 2022-05-28 DIAGNOSIS — R1011 Right upper quadrant pain: Secondary | ICD-10-CM

## 2022-05-28 DIAGNOSIS — D631 Anemia in chronic kidney disease: Secondary | ICD-10-CM | POA: Diagnosis not present

## 2022-05-28 DIAGNOSIS — E1129 Type 2 diabetes mellitus with other diabetic kidney complication: Secondary | ICD-10-CM | POA: Diagnosis not present

## 2022-05-28 DIAGNOSIS — L299 Pruritus, unspecified: Secondary | ICD-10-CM | POA: Diagnosis not present

## 2022-05-29 ENCOUNTER — Telehealth: Payer: Self-pay

## 2022-05-29 ENCOUNTER — Other Ambulatory Visit: Payer: Medicare Other

## 2022-05-29 NOTE — Telephone Encounter (Signed)
     Patient  visit on 2/27  at [facility]   Have you been able to follow up with your primary care physician? Yes   The patient was or was not able to obtain any needed medicine or equipment. Yes   Are there diet recommendations that you are having difficulty following? Na   Patient expresses understanding of discharge instructions and education provided has no other needs at this time.  Yes     [facility] [phone] [address], [place], [place] [address] Phone: [phone] Email: [email or web address]

## 2022-05-29 NOTE — Patient Instructions (Signed)
Visit Information  Thank you for taking time to visit with me today. Please don't hesitate to contact me if I can be of assistance to you.   Following are the goals we discussed today:   Goals Addressed             This Visit's Progress    COMPLETED: Care coordination activities-No follow up required       Care Coordination Interventions: Advised patient to Annual Wellness exam. Discussed [facility] services and support. Assessed SDOH. Advised to discuss with primary care physician if services needed in the future.         If you are experiencing a Mental Health or [place] or need someone to talk to, please call the Suicide and Crisis Lifeline: 988   Patient verbalizes understanding of instructions and care plan provided today and agrees to view in [place]. Active MyChart status and patient understanding of how to access instructions and care plan via MyChart confirmed with patient.     No further follow up required: decline-upstream patient   Jone Baseman, RN, MSN [place] [facility] Care Management Coordinator Direct Line [phone]

## 2022-05-29 NOTE — Patient Outreach (Addendum)
  Care Coordination   Initial Visit Note   05/29/2022 Name: Jonathan Mcneil MRN: [id number] DOB: September 19, 1955  Jonathan Mcneil is a 67 y.o. year old male who sees Merrilee Seashore, MD for primary care. I spoke with  Jonathan Mcneil by phone today.  What matters to the patients health and wellness today?  none    Goals Addressed             This Visit's Progress    COMPLETED: Care coordination activities-No follow up required       Care Coordination Interventions: Advised patient to Annual Wellness exam. Discussed [facility] services and support. Assessed SDOH. Advised to discuss with primary care physician if services needed in the future.        SDOH assessments and interventions completed:  Yes  SDOH Interventions Today    Flowsheet Row Most Recent Value  SDOH Interventions   Food Insecurity Interventions Intervention Not Indicated  Housing Interventions Intervention Not Indicated  Transportation Interventions Intervention Not Indicated        Care Coordination Interventions:  Yes, provided   Follow up plan: No further intervention required.   Encounter Outcome:  Pt. Visit Completed   Jone Baseman, RN, MSN [place] [facility] Care Management Coordinator Direct Line [phone]

## 2022-05-30 DIAGNOSIS — E1129 Type 2 diabetes mellitus with other diabetic kidney complication: Secondary | ICD-10-CM | POA: Diagnosis not present

## 2022-05-30 DIAGNOSIS — Z992 Dependence on renal dialysis: Secondary | ICD-10-CM | POA: Diagnosis not present

## 2022-05-30 DIAGNOSIS — N2581 Secondary hyperparathyroidism of renal origin: Secondary | ICD-10-CM | POA: Diagnosis not present

## 2022-05-30 DIAGNOSIS — R52 Pain, unspecified: Secondary | ICD-10-CM | POA: Diagnosis not present

## 2022-05-30 DIAGNOSIS — L299 Pruritus, unspecified: Secondary | ICD-10-CM | POA: Diagnosis not present

## 2022-05-30 DIAGNOSIS — D631 Anemia in chronic kidney disease: Secondary | ICD-10-CM | POA: Diagnosis not present

## 2022-05-30 DIAGNOSIS — N186 End stage renal disease: Secondary | ICD-10-CM | POA: Diagnosis not present

## 2022-06-01 DIAGNOSIS — E1129 Type 2 diabetes mellitus with other diabetic kidney complication: Secondary | ICD-10-CM | POA: Diagnosis not present

## 2022-06-01 DIAGNOSIS — R52 Pain, unspecified: Secondary | ICD-10-CM | POA: Diagnosis not present

## 2022-06-01 DIAGNOSIS — Z992 Dependence on renal dialysis: Secondary | ICD-10-CM | POA: Diagnosis not present

## 2022-06-01 DIAGNOSIS — L299 Pruritus, unspecified: Secondary | ICD-10-CM | POA: Diagnosis not present

## 2022-06-01 DIAGNOSIS — N2581 Secondary hyperparathyroidism of renal origin: Secondary | ICD-10-CM | POA: Diagnosis not present

## 2022-06-01 DIAGNOSIS — D631 Anemia in chronic kidney disease: Secondary | ICD-10-CM | POA: Diagnosis not present

## 2022-06-01 DIAGNOSIS — N186 End stage renal disease: Secondary | ICD-10-CM | POA: Diagnosis not present

## 2022-06-03 NOTE — Progress Notes (Signed)
Patient presents today to pick up diabetic shoes and insoles.  Patient was dispensed 1 pair of diabetic shoes and 3 pairs of foam casted diabetic insoles.   He tried on the shoes with the insoles and the fit was satisfactory.   Will follow up next year for new order.    

## 2022-06-04 DIAGNOSIS — R52 Pain, unspecified: Secondary | ICD-10-CM | POA: Diagnosis not present

## 2022-06-04 DIAGNOSIS — L299 Pruritus, unspecified: Secondary | ICD-10-CM | POA: Diagnosis not present

## 2022-06-04 DIAGNOSIS — N2581 Secondary hyperparathyroidism of renal origin: Secondary | ICD-10-CM | POA: Diagnosis not present

## 2022-06-04 DIAGNOSIS — E1129 Type 2 diabetes mellitus with other diabetic kidney complication: Secondary | ICD-10-CM | POA: Diagnosis not present

## 2022-06-04 DIAGNOSIS — N186 End stage renal disease: Secondary | ICD-10-CM | POA: Diagnosis not present

## 2022-06-04 DIAGNOSIS — D631 Anemia in chronic kidney disease: Secondary | ICD-10-CM | POA: Diagnosis not present

## 2022-06-04 DIAGNOSIS — Z992 Dependence on renal dialysis: Secondary | ICD-10-CM | POA: Diagnosis not present

## 2022-06-05 DIAGNOSIS — I132 Hypertensive heart and chronic kidney disease with heart failure and with stage 5 chronic kidney disease, or end stage renal disease: Secondary | ICD-10-CM | POA: Diagnosis not present

## 2022-06-05 DIAGNOSIS — E1159 Type 2 diabetes mellitus with other circulatory complications: Secondary | ICD-10-CM | POA: Diagnosis not present

## 2022-06-05 DIAGNOSIS — R799 Abnormal finding of blood chemistry, unspecified: Secondary | ICD-10-CM | POA: Diagnosis not present

## 2022-06-05 DIAGNOSIS — E1122 Type 2 diabetes mellitus with diabetic chronic kidney disease: Secondary | ICD-10-CM | POA: Diagnosis not present

## 2022-06-05 DIAGNOSIS — I5020 Unspecified systolic (congestive) heart failure: Secondary | ICD-10-CM | POA: Diagnosis not present

## 2022-06-05 DIAGNOSIS — N186 End stage renal disease: Secondary | ICD-10-CM | POA: Diagnosis not present

## 2022-06-05 DIAGNOSIS — I340 Nonrheumatic mitral (valve) insufficiency: Secondary | ICD-10-CM | POA: Diagnosis not present

## 2022-06-05 DIAGNOSIS — I428 Other cardiomyopathies: Secondary | ICD-10-CM | POA: Diagnosis not present

## 2022-06-05 DIAGNOSIS — Z992 Dependence on renal dialysis: Secondary | ICD-10-CM | POA: Diagnosis not present

## 2022-06-05 DIAGNOSIS — Z8639 Personal history of other endocrine, nutritional and metabolic disease: Secondary | ICD-10-CM | POA: Diagnosis not present

## 2022-06-06 ENCOUNTER — Ambulatory Visit (INDEPENDENT_AMBULATORY_CARE_PROVIDER_SITE_OTHER): Payer: Medicare Other | Admitting: Podiatry

## 2022-06-06 DIAGNOSIS — M79674 Pain in right toe(s): Secondary | ICD-10-CM

## 2022-06-06 DIAGNOSIS — B351 Tinea unguium: Secondary | ICD-10-CM

## 2022-06-06 DIAGNOSIS — R52 Pain, unspecified: Secondary | ICD-10-CM | POA: Diagnosis not present

## 2022-06-06 DIAGNOSIS — M79675 Pain in left toe(s): Secondary | ICD-10-CM | POA: Diagnosis not present

## 2022-06-06 DIAGNOSIS — Z992 Dependence on renal dialysis: Secondary | ICD-10-CM | POA: Diagnosis not present

## 2022-06-06 DIAGNOSIS — E1129 Type 2 diabetes mellitus with other diabetic kidney complication: Secondary | ICD-10-CM | POA: Diagnosis not present

## 2022-06-06 DIAGNOSIS — N2581 Secondary hyperparathyroidism of renal origin: Secondary | ICD-10-CM | POA: Diagnosis not present

## 2022-06-06 DIAGNOSIS — L299 Pruritus, unspecified: Secondary | ICD-10-CM | POA: Diagnosis not present

## 2022-06-06 DIAGNOSIS — E1149 Type 2 diabetes mellitus with other diabetic neurological complication: Secondary | ICD-10-CM

## 2022-06-06 DIAGNOSIS — D631 Anemia in chronic kidney disease: Secondary | ICD-10-CM | POA: Diagnosis not present

## 2022-06-06 DIAGNOSIS — N186 End stage renal disease: Secondary | ICD-10-CM | POA: Diagnosis not present

## 2022-06-06 NOTE — Progress Notes (Signed)
Subjective: Chief Complaint  Patient presents with   ROUTINE DIABETIC FOOT CARE     67 y.o. returns the office today for painful, elongated, thickened toenails which he cannot trim himself.  He is having issues with his shoestring type diabetic shoes to be is not wearing the shoes.  No open lesions.  PCP: Merrilee Seashore, MD   Objective: AAO 3, NAD DP/PT pulses palpable, CRT less than 3 seconds Sensation decreased with [place] Mule. Nails hypertrophic, dystrophic, elongated, brittle, discolored 10. There is tenderness overlying the nails 1-5 bilaterally. There is no surrounding erythema or drainage along the nail sites. No open lesions or pre-ulcerative lesions are identified. Arthritic changes present the first MPJ with decreased range of motion.  No pain to this area today. No pain with calf compression, swelling, warmth, erythema.  Assessment: Patient presents with symptomatic onychomycosis  Plan: -Treatment options including alternatives, risks, complications were discussed -Nails sharply debrided 10 without complication/bleeding. -Will check on getting new shoe braces for his shoes. -Discussed daily foot inspection. If there are any changes, to call the office immediately.  -Follow-up in 3 months or sooner if any problems are to arise. In the meantime, encouraged to call the office with any questions, concerns, changes symptoms.  Celesta Gentile, DPM

## 2022-06-07 ENCOUNTER — Encounter (INDEPENDENT_AMBULATORY_CARE_PROVIDER_SITE_OTHER): Payer: Medicare Other | Admitting: Ophthalmology

## 2022-06-07 ENCOUNTER — Encounter (HOSPITAL_COMMUNITY)
Admission: RE | Admit: 2022-06-07 | Discharge: 2022-06-07 | Disposition: A | Discharge status: Home / Self Care | Payer: Medicare Other | Source: Ambulatory Visit | Attending: Internal Medicine | Admitting: Internal Medicine

## 2022-06-07 ENCOUNTER — Encounter (INDEPENDENT_AMBULATORY_CARE_PROVIDER_SITE_OTHER): Payer: Self-pay

## 2022-06-07 DIAGNOSIS — E113513 Type 2 diabetes mellitus with proliferative diabetic retinopathy with macular edema, bilateral: Secondary | ICD-10-CM

## 2022-06-07 DIAGNOSIS — H35372 Puckering of macula, left eye: Secondary | ICD-10-CM

## 2022-06-07 DIAGNOSIS — H40053 Ocular hypertension, bilateral: Secondary | ICD-10-CM

## 2022-06-07 DIAGNOSIS — H35033 Hypertensive retinopathy, bilateral: Secondary | ICD-10-CM

## 2022-06-07 DIAGNOSIS — R1011 Right upper quadrant pain: Secondary | ICD-10-CM | POA: Diagnosis not present

## 2022-06-07 DIAGNOSIS — H25813 Combined forms of age-related cataract, bilateral: Secondary | ICD-10-CM

## 2022-06-07 DIAGNOSIS — I10 Essential (primary) hypertension: Secondary | ICD-10-CM

## 2022-06-07 DIAGNOSIS — H4311 Vitreous hemorrhage, right eye: Secondary | ICD-10-CM

## 2022-06-07 DIAGNOSIS — H43812 Vitreous degeneration, left eye: Secondary | ICD-10-CM

## 2022-06-07 DIAGNOSIS — H4312 Vitreous hemorrhage, left eye: Secondary | ICD-10-CM

## 2022-06-07 DIAGNOSIS — R1010 Upper abdominal pain, unspecified: Secondary | ICD-10-CM | POA: Diagnosis not present

## 2022-06-07 MED ORDER — TECHNETIUM TC 99M MEBROFENIN IV KIT
5.0000 | PACK | Freq: Once | INTRAVENOUS | Status: AC | PRN
  Administered 2022-06-07: 5 via INTRAVENOUS

## 2022-06-07 MED ORDER — POTASSIUM IODIDE (ANTIDOTE) 130 MG PO TABS: 130.0000 mg | ORAL_TABLET | Freq: Once | ORAL | Status: DC

## 2022-06-08 DIAGNOSIS — R52 Pain, unspecified: Secondary | ICD-10-CM | POA: Diagnosis not present

## 2022-06-08 DIAGNOSIS — D631 Anemia in chronic kidney disease: Secondary | ICD-10-CM | POA: Diagnosis not present

## 2022-06-08 DIAGNOSIS — Z992 Dependence on renal dialysis: Secondary | ICD-10-CM | POA: Diagnosis not present

## 2022-06-08 DIAGNOSIS — N186 End stage renal disease: Secondary | ICD-10-CM | POA: Diagnosis not present

## 2022-06-08 DIAGNOSIS — N2581 Secondary hyperparathyroidism of renal origin: Secondary | ICD-10-CM | POA: Diagnosis not present

## 2022-06-08 DIAGNOSIS — E1129 Type 2 diabetes mellitus with other diabetic kidney complication: Secondary | ICD-10-CM | POA: Diagnosis not present

## 2022-06-08 DIAGNOSIS — L299 Pruritus, unspecified: Secondary | ICD-10-CM | POA: Diagnosis not present

## 2022-06-10 NOTE — Telephone Encounter (Signed)
Spoke with patient and he wants another pair of shoe strings .  I called patient and told him I would call him back about shoe strings.

## 2022-06-11 DIAGNOSIS — D631 Anemia in chronic kidney disease: Secondary | ICD-10-CM | POA: Diagnosis not present

## 2022-06-11 DIAGNOSIS — E1129 Type 2 diabetes mellitus with other diabetic kidney complication: Secondary | ICD-10-CM | POA: Diagnosis not present

## 2022-06-11 DIAGNOSIS — N186 End stage renal disease: Secondary | ICD-10-CM | POA: Diagnosis not present

## 2022-06-11 DIAGNOSIS — L299 Pruritus, unspecified: Secondary | ICD-10-CM | POA: Diagnosis not present

## 2022-06-11 DIAGNOSIS — N2581 Secondary hyperparathyroidism of renal origin: Secondary | ICD-10-CM | POA: Diagnosis not present

## 2022-06-11 DIAGNOSIS — Z992 Dependence on renal dialysis: Secondary | ICD-10-CM | POA: Diagnosis not present

## 2022-06-11 DIAGNOSIS — R52 Pain, unspecified: Secondary | ICD-10-CM | POA: Diagnosis not present

## 2022-06-13 DIAGNOSIS — R52 Pain, unspecified: Secondary | ICD-10-CM | POA: Diagnosis not present

## 2022-06-13 DIAGNOSIS — D631 Anemia in chronic kidney disease: Secondary | ICD-10-CM | POA: Diagnosis not present

## 2022-06-13 DIAGNOSIS — Z992 Dependence on renal dialysis: Secondary | ICD-10-CM | POA: Diagnosis not present

## 2022-06-13 DIAGNOSIS — E1129 Type 2 diabetes mellitus with other diabetic kidney complication: Secondary | ICD-10-CM | POA: Diagnosis not present

## 2022-06-13 DIAGNOSIS — L299 Pruritus, unspecified: Secondary | ICD-10-CM | POA: Diagnosis not present

## 2022-06-13 DIAGNOSIS — N186 End stage renal disease: Secondary | ICD-10-CM | POA: Diagnosis not present

## 2022-06-13 DIAGNOSIS — N2581 Secondary hyperparathyroidism of renal origin: Secondary | ICD-10-CM | POA: Diagnosis not present

## 2022-06-15 DIAGNOSIS — N186 End stage renal disease: Secondary | ICD-10-CM | POA: Diagnosis not present

## 2022-06-15 DIAGNOSIS — R52 Pain, unspecified: Secondary | ICD-10-CM | POA: Diagnosis not present

## 2022-06-15 DIAGNOSIS — E1129 Type 2 diabetes mellitus with other diabetic kidney complication: Secondary | ICD-10-CM | POA: Diagnosis not present

## 2022-06-15 DIAGNOSIS — Z992 Dependence on renal dialysis: Secondary | ICD-10-CM | POA: Diagnosis not present

## 2022-06-15 DIAGNOSIS — D631 Anemia in chronic kidney disease: Secondary | ICD-10-CM | POA: Diagnosis not present

## 2022-06-15 DIAGNOSIS — N2581 Secondary hyperparathyroidism of renal origin: Secondary | ICD-10-CM | POA: Diagnosis not present

## 2022-06-15 DIAGNOSIS — L299 Pruritus, unspecified: Secondary | ICD-10-CM | POA: Diagnosis not present

## 2022-06-18 DIAGNOSIS — N2581 Secondary hyperparathyroidism of renal origin: Secondary | ICD-10-CM | POA: Diagnosis not present

## 2022-06-18 DIAGNOSIS — D631 Anemia in chronic kidney disease: Secondary | ICD-10-CM | POA: Diagnosis not present

## 2022-06-18 DIAGNOSIS — N186 End stage renal disease: Secondary | ICD-10-CM | POA: Diagnosis not present

## 2022-06-18 DIAGNOSIS — R52 Pain, unspecified: Secondary | ICD-10-CM | POA: Diagnosis not present

## 2022-06-18 DIAGNOSIS — E1129 Type 2 diabetes mellitus with other diabetic kidney complication: Secondary | ICD-10-CM | POA: Diagnosis not present

## 2022-06-18 DIAGNOSIS — Z992 Dependence on renal dialysis: Secondary | ICD-10-CM | POA: Diagnosis not present

## 2022-06-18 DIAGNOSIS — L299 Pruritus, unspecified: Secondary | ICD-10-CM | POA: Diagnosis not present

## 2022-06-20 DIAGNOSIS — Z992 Dependence on renal dialysis: Secondary | ICD-10-CM | POA: Diagnosis not present

## 2022-06-20 DIAGNOSIS — R52 Pain, unspecified: Secondary | ICD-10-CM | POA: Diagnosis not present

## 2022-06-20 DIAGNOSIS — L299 Pruritus, unspecified: Secondary | ICD-10-CM | POA: Diagnosis not present

## 2022-06-20 DIAGNOSIS — D631 Anemia in chronic kidney disease: Secondary | ICD-10-CM | POA: Diagnosis not present

## 2022-06-20 DIAGNOSIS — N2581 Secondary hyperparathyroidism of renal origin: Secondary | ICD-10-CM | POA: Diagnosis not present

## 2022-06-20 DIAGNOSIS — N186 End stage renal disease: Secondary | ICD-10-CM | POA: Diagnosis not present

## 2022-06-20 DIAGNOSIS — E1129 Type 2 diabetes mellitus with other diabetic kidney complication: Secondary | ICD-10-CM | POA: Diagnosis not present

## 2022-06-22 DIAGNOSIS — Z992 Dependence on renal dialysis: Secondary | ICD-10-CM | POA: Diagnosis not present

## 2022-06-22 DIAGNOSIS — E1129 Type 2 diabetes mellitus with other diabetic kidney complication: Secondary | ICD-10-CM | POA: Diagnosis not present

## 2022-06-22 DIAGNOSIS — R52 Pain, unspecified: Secondary | ICD-10-CM | POA: Diagnosis not present

## 2022-06-22 DIAGNOSIS — L299 Pruritus, unspecified: Secondary | ICD-10-CM | POA: Diagnosis not present

## 2022-06-22 DIAGNOSIS — D631 Anemia in chronic kidney disease: Secondary | ICD-10-CM | POA: Diagnosis not present

## 2022-06-22 DIAGNOSIS — N2581 Secondary hyperparathyroidism of renal origin: Secondary | ICD-10-CM | POA: Diagnosis not present

## 2022-06-22 DIAGNOSIS — N186 End stage renal disease: Secondary | ICD-10-CM | POA: Diagnosis not present

## 2022-06-23 DIAGNOSIS — N186 End stage renal disease: Secondary | ICD-10-CM | POA: Diagnosis not present

## 2022-06-23 DIAGNOSIS — Z992 Dependence on renal dialysis: Secondary | ICD-10-CM | POA: Diagnosis not present

## 2022-06-23 DIAGNOSIS — E1122 Type 2 diabetes mellitus with diabetic chronic kidney disease: Secondary | ICD-10-CM | POA: Diagnosis not present

## 2022-06-25 DIAGNOSIS — R52 Pain, unspecified: Secondary | ICD-10-CM | POA: Diagnosis not present

## 2022-06-25 DIAGNOSIS — N186 End stage renal disease: Secondary | ICD-10-CM | POA: Diagnosis not present

## 2022-06-25 DIAGNOSIS — Z992 Dependence on renal dialysis: Secondary | ICD-10-CM | POA: Diagnosis not present

## 2022-06-25 DIAGNOSIS — N2581 Secondary hyperparathyroidism of renal origin: Secondary | ICD-10-CM | POA: Diagnosis not present

## 2022-06-25 DIAGNOSIS — L299 Pruritus, unspecified: Secondary | ICD-10-CM | POA: Diagnosis not present

## 2022-06-25 DIAGNOSIS — E1129 Type 2 diabetes mellitus with other diabetic kidney complication: Secondary | ICD-10-CM | POA: Diagnosis not present

## 2022-06-25 DIAGNOSIS — D631 Anemia in chronic kidney disease: Secondary | ICD-10-CM | POA: Diagnosis not present

## 2022-06-27 DIAGNOSIS — D631 Anemia in chronic kidney disease: Secondary | ICD-10-CM | POA: Diagnosis not present

## 2022-06-27 DIAGNOSIS — L299 Pruritus, unspecified: Secondary | ICD-10-CM | POA: Diagnosis not present

## 2022-06-27 DIAGNOSIS — N186 End stage renal disease: Secondary | ICD-10-CM | POA: Diagnosis not present

## 2022-06-27 DIAGNOSIS — Z992 Dependence on renal dialysis: Secondary | ICD-10-CM | POA: Diagnosis not present

## 2022-06-27 DIAGNOSIS — R52 Pain, unspecified: Secondary | ICD-10-CM | POA: Diagnosis not present

## 2022-06-27 DIAGNOSIS — E1129 Type 2 diabetes mellitus with other diabetic kidney complication: Secondary | ICD-10-CM | POA: Diagnosis not present

## 2022-06-27 DIAGNOSIS — N2581 Secondary hyperparathyroidism of renal origin: Secondary | ICD-10-CM | POA: Diagnosis not present

## 2022-06-29 DIAGNOSIS — Z992 Dependence on renal dialysis: Secondary | ICD-10-CM | POA: Diagnosis not present

## 2022-06-29 DIAGNOSIS — D631 Anemia in chronic kidney disease: Secondary | ICD-10-CM | POA: Diagnosis not present

## 2022-06-29 DIAGNOSIS — N186 End stage renal disease: Secondary | ICD-10-CM | POA: Diagnosis not present

## 2022-06-29 DIAGNOSIS — E1129 Type 2 diabetes mellitus with other diabetic kidney complication: Secondary | ICD-10-CM | POA: Diagnosis not present

## 2022-06-29 DIAGNOSIS — N2581 Secondary hyperparathyroidism of renal origin: Secondary | ICD-10-CM | POA: Diagnosis not present

## 2022-06-29 DIAGNOSIS — L299 Pruritus, unspecified: Secondary | ICD-10-CM | POA: Diagnosis not present

## 2022-06-29 DIAGNOSIS — R52 Pain, unspecified: Secondary | ICD-10-CM | POA: Diagnosis not present

## 2022-07-02 DIAGNOSIS — N2581 Secondary hyperparathyroidism of renal origin: Secondary | ICD-10-CM | POA: Diagnosis not present

## 2022-07-02 DIAGNOSIS — R52 Pain, unspecified: Secondary | ICD-10-CM | POA: Diagnosis not present

## 2022-07-02 DIAGNOSIS — D631 Anemia in chronic kidney disease: Secondary | ICD-10-CM | POA: Diagnosis not present

## 2022-07-02 DIAGNOSIS — L299 Pruritus, unspecified: Secondary | ICD-10-CM | POA: Diagnosis not present

## 2022-07-02 DIAGNOSIS — N186 End stage renal disease: Secondary | ICD-10-CM | POA: Diagnosis not present

## 2022-07-02 DIAGNOSIS — E1129 Type 2 diabetes mellitus with other diabetic kidney complication: Secondary | ICD-10-CM | POA: Diagnosis not present

## 2022-07-02 DIAGNOSIS — Z992 Dependence on renal dialysis: Secondary | ICD-10-CM | POA: Diagnosis not present

## 2022-07-04 DIAGNOSIS — R52 Pain, unspecified: Secondary | ICD-10-CM | POA: Diagnosis not present

## 2022-07-04 DIAGNOSIS — N2581 Secondary hyperparathyroidism of renal origin: Secondary | ICD-10-CM | POA: Diagnosis not present

## 2022-07-04 DIAGNOSIS — E1129 Type 2 diabetes mellitus with other diabetic kidney complication: Secondary | ICD-10-CM | POA: Diagnosis not present

## 2022-07-04 DIAGNOSIS — Z992 Dependence on renal dialysis: Secondary | ICD-10-CM | POA: Diagnosis not present

## 2022-07-04 DIAGNOSIS — N186 End stage renal disease: Secondary | ICD-10-CM | POA: Diagnosis not present

## 2022-07-04 DIAGNOSIS — L299 Pruritus, unspecified: Secondary | ICD-10-CM | POA: Diagnosis not present

## 2022-07-04 DIAGNOSIS — D631 Anemia in chronic kidney disease: Secondary | ICD-10-CM | POA: Diagnosis not present

## 2022-07-06 DIAGNOSIS — N2581 Secondary hyperparathyroidism of renal origin: Secondary | ICD-10-CM | POA: Diagnosis not present

## 2022-07-06 DIAGNOSIS — Z992 Dependence on renal dialysis: Secondary | ICD-10-CM | POA: Diagnosis not present

## 2022-07-06 DIAGNOSIS — D631 Anemia in chronic kidney disease: Secondary | ICD-10-CM | POA: Diagnosis not present

## 2022-07-06 DIAGNOSIS — L299 Pruritus, unspecified: Secondary | ICD-10-CM | POA: Diagnosis not present

## 2022-07-06 DIAGNOSIS — E1129 Type 2 diabetes mellitus with other diabetic kidney complication: Secondary | ICD-10-CM | POA: Diagnosis not present

## 2022-07-06 DIAGNOSIS — R52 Pain, unspecified: Secondary | ICD-10-CM | POA: Diagnosis not present

## 2022-07-06 DIAGNOSIS — N186 End stage renal disease: Secondary | ICD-10-CM | POA: Diagnosis not present

## 2022-07-09 DIAGNOSIS — L299 Pruritus, unspecified: Secondary | ICD-10-CM | POA: Diagnosis not present

## 2022-07-09 DIAGNOSIS — Z992 Dependence on renal dialysis: Secondary | ICD-10-CM | POA: Diagnosis not present

## 2022-07-09 DIAGNOSIS — N2581 Secondary hyperparathyroidism of renal origin: Secondary | ICD-10-CM | POA: Diagnosis not present

## 2022-07-09 DIAGNOSIS — D631 Anemia in chronic kidney disease: Secondary | ICD-10-CM | POA: Diagnosis not present

## 2022-07-09 DIAGNOSIS — E1129 Type 2 diabetes mellitus with other diabetic kidney complication: Secondary | ICD-10-CM | POA: Diagnosis not present

## 2022-07-09 DIAGNOSIS — R52 Pain, unspecified: Secondary | ICD-10-CM | POA: Diagnosis not present

## 2022-07-09 DIAGNOSIS — N186 End stage renal disease: Secondary | ICD-10-CM | POA: Diagnosis not present

## 2022-07-11 DIAGNOSIS — L299 Pruritus, unspecified: Secondary | ICD-10-CM | POA: Diagnosis not present

## 2022-07-11 DIAGNOSIS — N186 End stage renal disease: Secondary | ICD-10-CM | POA: Diagnosis not present

## 2022-07-11 DIAGNOSIS — Z992 Dependence on renal dialysis: Secondary | ICD-10-CM | POA: Diagnosis not present

## 2022-07-11 DIAGNOSIS — R52 Pain, unspecified: Secondary | ICD-10-CM | POA: Diagnosis not present

## 2022-07-11 DIAGNOSIS — E1129 Type 2 diabetes mellitus with other diabetic kidney complication: Secondary | ICD-10-CM | POA: Diagnosis not present

## 2022-07-11 DIAGNOSIS — D631 Anemia in chronic kidney disease: Secondary | ICD-10-CM | POA: Diagnosis not present

## 2022-07-11 DIAGNOSIS — N2581 Secondary hyperparathyroidism of renal origin: Secondary | ICD-10-CM | POA: Diagnosis not present

## 2022-07-13 DIAGNOSIS — R52 Pain, unspecified: Secondary | ICD-10-CM | POA: Diagnosis not present

## 2022-07-13 DIAGNOSIS — Z992 Dependence on renal dialysis: Secondary | ICD-10-CM | POA: Diagnosis not present

## 2022-07-13 DIAGNOSIS — N2581 Secondary hyperparathyroidism of renal origin: Secondary | ICD-10-CM | POA: Diagnosis not present

## 2022-07-13 DIAGNOSIS — N186 End stage renal disease: Secondary | ICD-10-CM | POA: Diagnosis not present

## 2022-07-13 DIAGNOSIS — L299 Pruritus, unspecified: Secondary | ICD-10-CM | POA: Diagnosis not present

## 2022-07-13 DIAGNOSIS — E1129 Type 2 diabetes mellitus with other diabetic kidney complication: Secondary | ICD-10-CM | POA: Diagnosis not present

## 2022-07-13 DIAGNOSIS — D631 Anemia in chronic kidney disease: Secondary | ICD-10-CM | POA: Diagnosis not present

## 2022-07-16 DIAGNOSIS — N2581 Secondary hyperparathyroidism of renal origin: Secondary | ICD-10-CM | POA: Diagnosis not present

## 2022-07-16 DIAGNOSIS — N186 End stage renal disease: Secondary | ICD-10-CM | POA: Diagnosis not present

## 2022-07-16 DIAGNOSIS — L299 Pruritus, unspecified: Secondary | ICD-10-CM | POA: Diagnosis not present

## 2022-07-16 DIAGNOSIS — E1129 Type 2 diabetes mellitus with other diabetic kidney complication: Secondary | ICD-10-CM | POA: Diagnosis not present

## 2022-07-16 DIAGNOSIS — R52 Pain, unspecified: Secondary | ICD-10-CM | POA: Diagnosis not present

## 2022-07-16 DIAGNOSIS — Z992 Dependence on renal dialysis: Secondary | ICD-10-CM | POA: Diagnosis not present

## 2022-07-16 DIAGNOSIS — D631 Anemia in chronic kidney disease: Secondary | ICD-10-CM | POA: Diagnosis not present

## 2022-07-17 NOTE — Progress Notes (Signed)
[facility] - Clinic Note  07/19/2022     CHIEF COMPLAINT Patient presents for Retina Follow Up  HISTORY OF PRESENT ILLNESS: Jonathan Mcneil is a 67 y.o. male who presents to the clinic today for:   HPI     Retina Follow Up   Patient presents with  Diabetic Retinopathy.  In both eyes.  Severity is moderate.  Duration of 12 weeks.  Since onset it is stable.  I, the attending physician,  performed the HPI with the patient and updated documentation appropriately.        Comments   Pt here for 12 wk ret f/u PDR OU. Pt states [facility], hasn't been in due to having to pay balance.       Last edited by Rennis Chris, MD on 07/20/2022  1:55 AM.    Pt is delayed to follow up from 6 weeks to 12 weeks due to finances, pt states vision is stable, A1c was 5.9 on March 13th, he is still using Cosopt bid OU   Referring physician: Georgianne Fick, MD [address] [place],  [place] [address]  HISTORICAL INFORMATION:   Selected notes from the MEDICAL RECORD NUMBER Referred by Dr. Aletta Edouard for heme OD   CURRENT MEDICATIONS: Current Outpatient Medications (Ophthalmic Drugs)  Medication Sig   dorzolamide-timolol (COSOPT) 2-0.5 % ophthalmic solution Place 1 drop into both eyes 2 (two) times daily.   No current facility-administered medications for this visit. (Ophthalmic Drugs)   Current Outpatient Medications (Other)  Medication Sig   acetaminophen (TYLENOL) 650 MG CR tablet Take 1,300 mg by mouth daily as needed for pain.   ENTRESTO 24-26 MG Take 1 tablet by mouth 2 (two) times daily.   gabapentin (NEURONTIN) 300 MG capsule Take 300 mg by mouth at bedtime.   loperamide (IMODIUM) 2 MG capsule Take 1 capsule (2 mg total) by mouth 4 (four) times daily as needed for diarrhea or loose stools.   ondansetron (ZOFRAN-ODT) 4 MG disintegrating tablet Take 1 tablet (4 mg total) by mouth every 8 (eight) hours as needed for nausea or vomiting.    pantoprazole (PROTONIX) 40 MG tablet Take 1 tablet (40 mg total) by mouth 2 (two) times daily before a meal. Twice a day x 4 weeks, then daily   sertraline (ZOLOFT) 25 MG tablet Take 25 mg by mouth daily as needed (depression).   sucroferric oxyhydroxide (VELPHORO) 500 MG chewable tablet Chew 1,500 mg by mouth 3 (three) times daily with meals.   No current facility-administered medications for this visit. (Other)   REVIEW OF SYSTEMS: ROS   Positive for: Gastrointestinal, Neurological, Genitourinary, Endocrine, Eyes Negative for: Constitutional, Skin, Musculoskeletal, HENT, Cardiovascular, Respiratory, Psychiatric, Allergic/Imm, Heme/Lymph Last edited by Thompson Grayer, COT on 07/19/2022  1:03 PM.      ALLERGIES Allergies  Allergen Reactions   Gabapentin Nausea Only   Ibuprofen Hives   Penicillins Itching and Other (See Comments)    Has patient had a PCN reaction causing immediate rash, facial/tongue/throat swelling, SOB or lightheadedness with hypotension: No Has patient had a PCN reaction causing severe rash involving mucus membranes or skin necrosis: No Has patient had a PCN reaction that required hospitalization No Has patient had a PCN reaction occurring within the last 10 years: Unknown If all of the above answers are "NO", then may proceed with Cephalosporin use.   Venlafaxine Other (See Comments)    insomnia   PAST MEDICAL HISTORY Past Medical  History:  Diagnosis Date   Cataract    Diabetes mellitus without complication (HCC)    Diabetic retinopathy (HCC)    ESRD (end stage renal disease) (HCC)    GERD (gastroesophageal reflux disease)    PMH   Hypertension    Hypertensive retinopathy    Low back pain    Lung disease    Metabolic acidosis    Neuromuscular disorder (HCC)    peripheral neuropathy   Pancreatitis    Schizophrenia (HCC)    does not take medications   Past Surgical History:  Procedure Laterality Date   A/V FISTULAGRAM Left 06/03/2018    Procedure: A/V FISTULAGRAM;  Surgeon: Cephus Shelling, MD;  Location: [facility] INVASIVE [facility];  Service: Cardiovascular;  Laterality: Left;   AV FISTULA PLACEMENT Left 02/11/2018   Procedure: INSERTION OF ARTERIOVENOUS (AV) FISTULA LEFT  ARM;  Surgeon: Maeola Harman, MD;  Location[facility];  Service: Vascular;  Laterality: Left;   BASCILIC VEIN TRANSPOSITION Left 04/17/2018   Procedure: BASILIC VEIN TRANSPOSITION SECOND STAGE;  Surgeon: Maeola Harman, MD;  Location: [facility] OR;  Service: Vascular;  Laterality: Left;   BIOPSY  10/20/2019   Procedure: BIOPSY;  Surgeon: Vida Rigger, MD;  Location: [facility] ENDOSCOPY;  Service: Endoscopy;;   COLONOSCOPY     DENTAL SURGERY     ESOPHAGOGASTRODUODENOSCOPY N/A 06/16/2021   Procedure: ESOPHAGOGASTRODUODENOSCOPY (EGD);  Surgeon: Charlott Rakes, MD;  Location[facility] ENDOSCOPY;  Service: Gastroenterology;  Laterality: N/A;   ESOPHAGOGASTRODUODENOSCOPY (EGD) WITH PROPOFOL N/A 10/20/2019   Procedure: ESOPHAGOGASTRODUODENOSCOPY (EGD) WITH PROPOFOL;  Surgeon: Vida Rigger, MD;  Location: [facility] ENDOSCOPY;  Service: Endoscopy;  Laterality: N/A;   INSERTION OF DIALYSIS CATHETER Right 02/25/2018   Procedure: INSERTION OF DIALYSIS CATHETER;  Surgeon: Maeola Harman, MD;  Location: [facility] OR;  Service: Vascular;  Laterality: Right;   PERIPHERAL VASCULAR BALLOON ANGIOPLASTY  06/03/2018   Procedure: PERIPHERAL VASCULAR BALLOON ANGIOPLASTY;  Surgeon: Cephus Shelling, MD;  Location: [facility] INVASIVE CV LAB;  Service: Cardiovascular;;  left a/v fistula   FAMILY HISTORY Family History  Problem Relation Age of Onset   Kidney failure Mother    Diabetes Father    Blindness Brother    SOCIAL HISTORY Social History   Tobacco Use   Smoking status: Former   Smokeless tobacco: Never  Building services engineer Use: Never used  Substance Use Topics   Alcohol use: No   Drug use: Not Currently    Types: Cocaine, Marijuana    Comment: smoked marijuana a few days ago;  he denies using cocaine       OPHTHALMIC EXAM:  Base Eye Exam     Visual Acuity (0Snellen - Linear)       Right Left   Dist [place] 20/25 -2 20/20 -2   Dist ph [place] NI          Tonometry (Tonopen, 1:08 PM)       Right Left   Pressure 19 19         Pupils       Pupils Dark Light Shape React APD   Right PERRL 2 2 Round Minimal None   Left PERRL 2 2 Round Minimal None         Visual Fields (Counting fingers)       Left Right    Full Full         Extraocular Movement       Right Left    Full, Ortho Full, Ortho  Neuro/Psych     Oriented x3: Yes   Mood/Affect: Normal         Dilation     Both eyes: 1.0% Mydriacyl, 2.5% Phenylephrine @ 1:09 PM           Slit Lamp and Fundus Exam     Slit Lamp Exam       Right Left   Lids/Lashes Dermatochalasis - upper lid Dermatochalasis - upper lid   Conjunctiva/Sclera Melanosis Melanosis, temporal pinguecula   Cornea trace Punctate epithelial erosions, mild arcus, mild tear film debris trace Punctate epithelial erosions, arcus, mild tear film debris   Anterior Chamber Deep and quiet Deep and quiet   Iris Round and dilated, No NVI Round and poorly dilated, No NVI   Lens 2-3+ Nuclear sclerosis, 2-3+ Cortical cataract 2-3+ Nuclear sclerosis, 2-3+ Cortical cataract   Anterior Vitreous Vitreous syneresis, old white blood clots settled inferiorly Vitreous syneresis, blood stained vitreous condensations - improved, mild blood clots settling inferiorly and improving, Posterior vitreous detachment         Fundus Exam       Right Left   Disc Mild Pallor, Sharp rim, focal PPP/PPA Mild Pallor, Sharp rim   C/D Ratio 0.5 0.5   Macula Flat, Good foveal reflex, RPE mottling and clumping, cystic changes temporal macula, trace MA, + ERM Flat, good foveal reflex, ERM, RPE mottling and clumping, scattered Microaneurysms/DBH greatest temporal macula, trace cystic changes temporal macula -- slightly improved   Vessels  attenuated, Tortuous attenuated, mild tortuosity   Periphery Attached, 360 IRH/DBH - improved, pre-retinal heme settled inferiorly, 360 PRP with room for fill in Attached, scattered IRH/DBH greatest nasally, good 360 PRP changes with room for fill in, No RT/RD           IMAGING AND PROCEDURES  Imaging and Procedures for 07/19/2022  OCT, Retina - OU - Both Eyes       Right Eye Quality was good. Central Foveal Thickness: 274. Progression has been stable. Findings include no SRF, abnormal foveal contour, intraretinal hyper-reflective material, epiretinal membrane, intraretinal fluid (stable improvement in vitreous opacities, trace non-central cystic changes, mild EMR).   Left Eye Quality was good. Central Foveal Thickness: 311. Progression has improved. Findings include abnormal foveal contour, intraretinal hyper-reflective material, epiretinal membrane, intraretinal fluid, subretinal fluid (persistent focal pockets of SRF IN periphery - caught on widefield -- improved, ERM with blunted foveal contour, trace cystic changes temporal macula).   Notes *Images captured and stored on drive  Diagnosis / Impression:  OD: stable improvement in vitreous opacities, trace non-central cystic changes, mild EMR OS: persistent focal pockets of SRF IN periphery - caught on widefield -- improved, ERM with blunted foveal contour, trace cystic changes temporal macula  Clinical management:  See below  Abbreviations: NFP - Normal foveal profile. CME - cystoid macular edema. PED - pigment epithelial detachment. IRF - intraretinal fluid. SRF - subretinal fluid. EZ - ellipsoid zone. ERM - epiretinal membrane. ORA - outer retinal atrophy. ORT - outer retinal tubulation. SRHM - subretinal hyper-reflective material. IRHM - intraretinal hyper-reflective material            ASSESSMENT/PLAN:   ICD-10-CM   1. Proliferative diabetic retinopathy of both eyes with macular edema associated with type 2 diabetes  mellitus (HCC)  E11.3513 OCT, Retina - OU - Both Eyes    2. Vitreous hemorrhage of left eye (HCC)  H43.12     3. Vitreous hemorrhage, right eye (HCC)  H43.11     4. Posterior  vitreous detachment of left eye  H43.812     5. Essential hypertension  I10     6. Hypertensive retinopathy of both eyes  H35.033     7. Epiretinal membrane (ERM) of left eye  H35.372     8. Combined forms of age-related cataract of both eyes  H25.813     9. Bilateral ocular hypertension  H40.053      1-3. Proliferative diabetic retinopathy w/ recurrent VH OU (OD > OS)  - A1c: 5.9 on 03.13.24 - s/p IVA OD #1 (12.08.21), #2 (01.11.22), #3 (02.09.22), #4 (11.04.22), #5 (12.02.22), #6 (12.30.22), #7 (01.27.23), #8 (01.05.24), #9 (02.02.24) - s/p IVA OS #1 (12.10.21), #2 (01.11.22), #3 (02.09.22), #4 (09.09.22), #5 (10.07.22), #6 (11.04.22), #7 (12.02.22), #8 (12.30.22) - FA (12.08.21) shows late-leaking MA, vascular nonperfusion, +NV OU (nasal midzone) - s/p PRP OD (01.26.22) - s/p PRP OS (03.09.22) - BCVA 20/25 OD, 20/20 OS - stable OU - exam shows interval improvement in VH OD; OS with blood stained vit condensations improving from hemorrhagic PVD, and blood clots settled inferiorly  - OCT shows OD: stable improvement in vitreous opacities, trace non-central cystic changes, mild EMR; OS: persistent focal pockets of SRF IN periphery - caught on widefield -- improved, ERM with blunted foveal contour, trace cystic changes temporal macula at 12 weeks  - recommend holding injection today  - repeat FA 6.6.22 shows interval improvement in NVE/leakage OU s/p PRP OU-- just minimal leakage remains  - IVA informed consent obtained and signed - f/u 3-4 months, DFE, OCT, FA (transit OS), possible injections possible fill in PRP  4. Hemorrhagic PVD OS  - Onset mid-August 2022 w/ new onset floaters, no photopsias  - s/p IVA OS #4 (09.09.22), #5 (10.07.22), #6 (11.04.22), #7 (12.02.22), #8 (12.30.22) #9 01.05.24 - today, [facility]  stably improved to 20/20 OS from 20/40 and 20/80 prior - pt states they have been holding heparin at dialysis - exam shows interval improvement in vitreous heme -- clearing and settling inferiorly - pt reports previously receiving heparin during dialysis (T, Th, Sat) -- may have contributed to interval worsening   - Discussed findings and prognosis  - No RT or RD on 360 peripheral exam  - Reviewed s/s of RT/RD  - Strict return precautions for any such RT/RD signs/symptoms  - VH precautions reviewed -- minimize activities, keep head elevated, hold heparin if able, avoid ASA/NSAIDS  5,6. Hypertensive retinopathy OU - discussed importance of tight BP control - monitor  7. Epiretinal membrane, left eye  - mild ERM - BCVA 20/20 - asymptomatic, no metamorphopsia - no indication for surgery at this time - monitor for now - f/u 3 mos -- DFE/OCT  8. Mixed Cataract OU - The symptoms of cataract, surgical options, and treatments and risks were discussed with patient - discussed diagnosis and progression - approaching visual significance - monitor  9. Ocular Hypertension OU  - IOP today: 19 OU  - cont cosopt bid OU    Ophthalmic Meds Ordered this visit:  No orders of the defined types were placed in this encounter.    Return for f/u 3-4 months, PDR OU, DFE, OCT.  There are no Patient Instructions on file for this visit.  This document serves as a record of services personally performed by Karie Chimera, MD, PhD. It was created on their behalf by Glee Arvin. Manson Passey, OA an ophthalmic technician. The creation of this record is the provider's dictation and/or activities during the visit.  Electronically signed by: Glee Arvin. Manson Passey, [place] 04.24.2024 1:56 AM   Karie Chimera, M.D., Ph.D. Diseases & Surgery of the Retina and Vitreous [facility] 07/19/2022   I have reviewed the above documentation for accuracy and completeness, and I agree with the above. Karie Chimera, M.D., Ph.D. 07/20/22 1:58 AM   .Abbreviations: M myopia (nearsighted); A astigmatism; H hyperopia (farsighted); P presbyopia; Mrx spectacle prescription;  CTL contact lenses; OD right eye; OS left eye; OU both eyes  XT exotropia; ET esotropia; PEK punctate epithelial keratitis; PEE punctate epithelial erosions; DES dry eye syndrome; MGD meibomian gland dysfunction; ATs artificial tears; PFAT's preservative free artificial tears; NSC nuclear sclerotic cataract; PSC posterior subcapsular cataract; ERM epi-retinal membrane; PVD posterior vitreous detachment; RD retinal detachment; DM diabetes mellitus; DR diabetic retinopathy; NPDR non-proliferative diabetic retinopathy; PDR proliferative diabetic retinopathy; CSME clinically significant macular edema; DME diabetic macular edema; dbh dot blot hemorrhages; CWS cotton wool spot; POAG primary open angle glaucoma; C/D cup-to-disc ratio; HVF humphrey visual field; GVF goldmann visual field; OCT optical coherence tomography; IOP intraocular pressure; BRVO Branch retinal vein occlusion; CRVO central retinal vein occlusion; CRAO central retinal artery occlusion; BRAO branch retinal artery occlusion; RT retinal tear; SB scleral buckle; PPV pars plana vitrectomy; VH Vitreous hemorrhage; PRP panretinal laser photocoagulation; IVK intravitreal kenalog; VMT vitreomacular traction; MH Macular hole;  NVD neovascularization of the disc; NVE neovascularization elsewhere; AREDS age related eye disease study; ARMD age related macular degeneration; POAG primary open angle glaucoma; EBMD epithelial/anterior basement membrane dystrophy; ACIOL anterior chamber intraocular lens; IOL intraocular lens; PCIOL posterior chamber intraocular lens; Phaco/IOL phacoemulsification with intraocular lens placement; PRK photorefractive keratectomy; LASIK laser assisted in situ keratomileusis; HTN hypertension; DM diabetes mellitus; COPD chronic obstructive pulmonary disease

## 2022-07-18 DIAGNOSIS — N186 End stage renal disease: Secondary | ICD-10-CM | POA: Diagnosis not present

## 2022-07-18 DIAGNOSIS — N2581 Secondary hyperparathyroidism of renal origin: Secondary | ICD-10-CM | POA: Diagnosis not present

## 2022-07-18 DIAGNOSIS — D631 Anemia in chronic kidney disease: Secondary | ICD-10-CM | POA: Diagnosis not present

## 2022-07-18 DIAGNOSIS — L299 Pruritus, unspecified: Secondary | ICD-10-CM | POA: Diagnosis not present

## 2022-07-18 DIAGNOSIS — E1129 Type 2 diabetes mellitus with other diabetic kidney complication: Secondary | ICD-10-CM | POA: Diagnosis not present

## 2022-07-18 DIAGNOSIS — Z992 Dependence on renal dialysis: Secondary | ICD-10-CM | POA: Diagnosis not present

## 2022-07-18 DIAGNOSIS — R52 Pain, unspecified: Secondary | ICD-10-CM | POA: Diagnosis not present

## 2022-07-19 ENCOUNTER — Ambulatory Visit (INDEPENDENT_AMBULATORY_CARE_PROVIDER_SITE_OTHER): Payer: Medicare Other | Admitting: Ophthalmology

## 2022-07-19 ENCOUNTER — Encounter (INDEPENDENT_AMBULATORY_CARE_PROVIDER_SITE_OTHER): Payer: Self-pay | Admitting: Ophthalmology

## 2022-07-19 DIAGNOSIS — H4311 Vitreous hemorrhage, right eye: Secondary | ICD-10-CM

## 2022-07-19 DIAGNOSIS — E113513 Type 2 diabetes mellitus with proliferative diabetic retinopathy with macular edema, bilateral: Secondary | ICD-10-CM | POA: Diagnosis not present

## 2022-07-19 DIAGNOSIS — H25813 Combined forms of age-related cataract, bilateral: Secondary | ICD-10-CM | POA: Diagnosis not present

## 2022-07-19 DIAGNOSIS — I10 Essential (primary) hypertension: Secondary | ICD-10-CM | POA: Diagnosis not present

## 2022-07-19 DIAGNOSIS — H35372 Puckering of macula, left eye: Secondary | ICD-10-CM

## 2022-07-19 DIAGNOSIS — H4313 Vitreous hemorrhage, bilateral: Secondary | ICD-10-CM

## 2022-07-19 DIAGNOSIS — Z794 Long term (current) use of insulin: Secondary | ICD-10-CM | POA: Diagnosis not present

## 2022-07-19 DIAGNOSIS — H43812 Vitreous degeneration, left eye: Secondary | ICD-10-CM | POA: Diagnosis not present

## 2022-07-19 DIAGNOSIS — H40053 Ocular hypertension, bilateral: Secondary | ICD-10-CM | POA: Diagnosis not present

## 2022-07-19 DIAGNOSIS — H4312 Vitreous hemorrhage, left eye: Secondary | ICD-10-CM

## 2022-07-19 DIAGNOSIS — H35033 Hypertensive retinopathy, bilateral: Secondary | ICD-10-CM

## 2022-07-20 ENCOUNTER — Encounter (INDEPENDENT_AMBULATORY_CARE_PROVIDER_SITE_OTHER): Payer: Self-pay | Admitting: Ophthalmology

## 2022-07-20 DIAGNOSIS — Z992 Dependence on renal dialysis: Secondary | ICD-10-CM | POA: Diagnosis not present

## 2022-07-20 DIAGNOSIS — L299 Pruritus, unspecified: Secondary | ICD-10-CM | POA: Diagnosis not present

## 2022-07-20 DIAGNOSIS — E1129 Type 2 diabetes mellitus with other diabetic kidney complication: Secondary | ICD-10-CM | POA: Diagnosis not present

## 2022-07-20 DIAGNOSIS — N186 End stage renal disease: Secondary | ICD-10-CM | POA: Diagnosis not present

## 2022-07-20 DIAGNOSIS — D631 Anemia in chronic kidney disease: Secondary | ICD-10-CM | POA: Diagnosis not present

## 2022-07-20 DIAGNOSIS — R52 Pain, unspecified: Secondary | ICD-10-CM | POA: Diagnosis not present

## 2022-07-20 DIAGNOSIS — N2581 Secondary hyperparathyroidism of renal origin: Secondary | ICD-10-CM | POA: Diagnosis not present

## 2022-07-23 DIAGNOSIS — L299 Pruritus, unspecified: Secondary | ICD-10-CM | POA: Diagnosis not present

## 2022-07-23 DIAGNOSIS — E1122 Type 2 diabetes mellitus with diabetic chronic kidney disease: Secondary | ICD-10-CM | POA: Diagnosis not present

## 2022-07-23 DIAGNOSIS — Z992 Dependence on renal dialysis: Secondary | ICD-10-CM | POA: Diagnosis not present

## 2022-07-23 DIAGNOSIS — N2581 Secondary hyperparathyroidism of renal origin: Secondary | ICD-10-CM | POA: Diagnosis not present

## 2022-07-23 DIAGNOSIS — D631 Anemia in chronic kidney disease: Secondary | ICD-10-CM | POA: Diagnosis not present

## 2022-07-23 DIAGNOSIS — N186 End stage renal disease: Secondary | ICD-10-CM | POA: Diagnosis not present

## 2022-07-23 DIAGNOSIS — E1129 Type 2 diabetes mellitus with other diabetic kidney complication: Secondary | ICD-10-CM | POA: Diagnosis not present

## 2022-07-23 DIAGNOSIS — R52 Pain, unspecified: Secondary | ICD-10-CM | POA: Diagnosis not present

## 2022-07-25 DIAGNOSIS — Z992 Dependence on renal dialysis: Secondary | ICD-10-CM | POA: Diagnosis not present

## 2022-07-25 DIAGNOSIS — N2581 Secondary hyperparathyroidism of renal origin: Secondary | ICD-10-CM | POA: Diagnosis not present

## 2022-07-25 DIAGNOSIS — N186 End stage renal disease: Secondary | ICD-10-CM | POA: Diagnosis not present

## 2022-07-25 DIAGNOSIS — R52 Pain, unspecified: Secondary | ICD-10-CM | POA: Diagnosis not present

## 2022-07-25 DIAGNOSIS — D631 Anemia in chronic kidney disease: Secondary | ICD-10-CM | POA: Diagnosis not present

## 2022-07-25 DIAGNOSIS — L299 Pruritus, unspecified: Secondary | ICD-10-CM | POA: Diagnosis not present

## 2022-07-25 DIAGNOSIS — E1129 Type 2 diabetes mellitus with other diabetic kidney complication: Secondary | ICD-10-CM | POA: Diagnosis not present

## 2022-07-27 DIAGNOSIS — Z992 Dependence on renal dialysis: Secondary | ICD-10-CM | POA: Diagnosis not present

## 2022-07-27 DIAGNOSIS — L299 Pruritus, unspecified: Secondary | ICD-10-CM | POA: Diagnosis not present

## 2022-07-27 DIAGNOSIS — E1129 Type 2 diabetes mellitus with other diabetic kidney complication: Secondary | ICD-10-CM | POA: Diagnosis not present

## 2022-07-27 DIAGNOSIS — D631 Anemia in chronic kidney disease: Secondary | ICD-10-CM | POA: Diagnosis not present

## 2022-07-27 DIAGNOSIS — N186 End stage renal disease: Secondary | ICD-10-CM | POA: Diagnosis not present

## 2022-07-27 DIAGNOSIS — N2581 Secondary hyperparathyroidism of renal origin: Secondary | ICD-10-CM | POA: Diagnosis not present

## 2022-07-27 DIAGNOSIS — R52 Pain, unspecified: Secondary | ICD-10-CM | POA: Diagnosis not present

## 2022-07-30 DIAGNOSIS — E1129 Type 2 diabetes mellitus with other diabetic kidney complication: Secondary | ICD-10-CM | POA: Diagnosis not present

## 2022-07-30 DIAGNOSIS — D631 Anemia in chronic kidney disease: Secondary | ICD-10-CM | POA: Diagnosis not present

## 2022-07-30 DIAGNOSIS — L299 Pruritus, unspecified: Secondary | ICD-10-CM | POA: Diagnosis not present

## 2022-07-30 DIAGNOSIS — N2581 Secondary hyperparathyroidism of renal origin: Secondary | ICD-10-CM | POA: Diagnosis not present

## 2022-07-30 DIAGNOSIS — N186 End stage renal disease: Secondary | ICD-10-CM | POA: Diagnosis not present

## 2022-07-30 DIAGNOSIS — Z992 Dependence on renal dialysis: Secondary | ICD-10-CM | POA: Diagnosis not present

## 2022-07-30 DIAGNOSIS — R52 Pain, unspecified: Secondary | ICD-10-CM | POA: Diagnosis not present

## 2022-08-01 DIAGNOSIS — Z992 Dependence on renal dialysis: Secondary | ICD-10-CM | POA: Diagnosis not present

## 2022-08-01 DIAGNOSIS — E1129 Type 2 diabetes mellitus with other diabetic kidney complication: Secondary | ICD-10-CM | POA: Diagnosis not present

## 2022-08-01 DIAGNOSIS — L299 Pruritus, unspecified: Secondary | ICD-10-CM | POA: Diagnosis not present

## 2022-08-01 DIAGNOSIS — R52 Pain, unspecified: Secondary | ICD-10-CM | POA: Diagnosis not present

## 2022-08-01 DIAGNOSIS — D631 Anemia in chronic kidney disease: Secondary | ICD-10-CM | POA: Diagnosis not present

## 2022-08-01 DIAGNOSIS — N186 End stage renal disease: Secondary | ICD-10-CM | POA: Diagnosis not present

## 2022-08-01 DIAGNOSIS — N2581 Secondary hyperparathyroidism of renal origin: Secondary | ICD-10-CM | POA: Diagnosis not present

## 2022-08-03 DIAGNOSIS — Z992 Dependence on renal dialysis: Secondary | ICD-10-CM | POA: Diagnosis not present

## 2022-08-03 DIAGNOSIS — E1129 Type 2 diabetes mellitus with other diabetic kidney complication: Secondary | ICD-10-CM | POA: Diagnosis not present

## 2022-08-03 DIAGNOSIS — N2581 Secondary hyperparathyroidism of renal origin: Secondary | ICD-10-CM | POA: Diagnosis not present

## 2022-08-03 DIAGNOSIS — D631 Anemia in chronic kidney disease: Secondary | ICD-10-CM | POA: Diagnosis not present

## 2022-08-03 DIAGNOSIS — R52 Pain, unspecified: Secondary | ICD-10-CM | POA: Diagnosis not present

## 2022-08-03 DIAGNOSIS — N186 End stage renal disease: Secondary | ICD-10-CM | POA: Diagnosis not present

## 2022-08-03 DIAGNOSIS — L299 Pruritus, unspecified: Secondary | ICD-10-CM | POA: Diagnosis not present

## 2022-08-06 DIAGNOSIS — Z992 Dependence on renal dialysis: Secondary | ICD-10-CM | POA: Diagnosis not present

## 2022-08-06 DIAGNOSIS — L299 Pruritus, unspecified: Secondary | ICD-10-CM | POA: Diagnosis not present

## 2022-08-06 DIAGNOSIS — D631 Anemia in chronic kidney disease: Secondary | ICD-10-CM | POA: Diagnosis not present

## 2022-08-06 DIAGNOSIS — R52 Pain, unspecified: Secondary | ICD-10-CM | POA: Diagnosis not present

## 2022-08-06 DIAGNOSIS — N2581 Secondary hyperparathyroidism of renal origin: Secondary | ICD-10-CM | POA: Diagnosis not present

## 2022-08-06 DIAGNOSIS — N186 End stage renal disease: Secondary | ICD-10-CM | POA: Diagnosis not present

## 2022-08-06 DIAGNOSIS — E1129 Type 2 diabetes mellitus with other diabetic kidney complication: Secondary | ICD-10-CM | POA: Diagnosis not present

## 2022-08-08 DIAGNOSIS — E1129 Type 2 diabetes mellitus with other diabetic kidney complication: Secondary | ICD-10-CM | POA: Diagnosis not present

## 2022-08-08 DIAGNOSIS — D631 Anemia in chronic kidney disease: Secondary | ICD-10-CM | POA: Diagnosis not present

## 2022-08-08 DIAGNOSIS — R52 Pain, unspecified: Secondary | ICD-10-CM | POA: Diagnosis not present

## 2022-08-08 DIAGNOSIS — N186 End stage renal disease: Secondary | ICD-10-CM | POA: Diagnosis not present

## 2022-08-08 DIAGNOSIS — L299 Pruritus, unspecified: Secondary | ICD-10-CM | POA: Diagnosis not present

## 2022-08-08 DIAGNOSIS — Z992 Dependence on renal dialysis: Secondary | ICD-10-CM | POA: Diagnosis not present

## 2022-08-08 DIAGNOSIS — N2581 Secondary hyperparathyroidism of renal origin: Secondary | ICD-10-CM | POA: Diagnosis not present

## 2022-08-10 DIAGNOSIS — R52 Pain, unspecified: Secondary | ICD-10-CM | POA: Diagnosis not present

## 2022-08-10 DIAGNOSIS — E1129 Type 2 diabetes mellitus with other diabetic kidney complication: Secondary | ICD-10-CM | POA: Diagnosis not present

## 2022-08-10 DIAGNOSIS — D631 Anemia in chronic kidney disease: Secondary | ICD-10-CM | POA: Diagnosis not present

## 2022-08-10 DIAGNOSIS — Z992 Dependence on renal dialysis: Secondary | ICD-10-CM | POA: Diagnosis not present

## 2022-08-10 DIAGNOSIS — L299 Pruritus, unspecified: Secondary | ICD-10-CM | POA: Diagnosis not present

## 2022-08-10 DIAGNOSIS — N2581 Secondary hyperparathyroidism of renal origin: Secondary | ICD-10-CM | POA: Diagnosis not present

## 2022-08-10 DIAGNOSIS — N186 End stage renal disease: Secondary | ICD-10-CM | POA: Diagnosis not present

## 2022-08-12 ENCOUNTER — Emergency Department (HOSPITAL_COMMUNITY): Payer: Medicare Other

## 2022-08-12 ENCOUNTER — Emergency Department (HOSPITAL_COMMUNITY)
Admission: EM | Admit: 2022-08-12 | Discharge: 2022-08-12 | Disposition: A | Discharge status: Home / Self Care | Payer: Medicare Other | Attending: Emergency Medicine | Admitting: Emergency Medicine

## 2022-08-12 ENCOUNTER — Encounter (HOSPITAL_COMMUNITY): Payer: Self-pay

## 2022-08-12 ENCOUNTER — Other Ambulatory Visit: Payer: Self-pay

## 2022-08-12 DIAGNOSIS — R079 Chest pain, unspecified: Secondary | ICD-10-CM

## 2022-08-12 DIAGNOSIS — J811 Chronic pulmonary edema: Secondary | ICD-10-CM | POA: Diagnosis not present

## 2022-08-12 DIAGNOSIS — N186 End stage renal disease: Secondary | ICD-10-CM | POA: Insufficient documentation

## 2022-08-12 DIAGNOSIS — E1140 Type 2 diabetes mellitus with diabetic neuropathy, unspecified: Secondary | ICD-10-CM | POA: Diagnosis not present

## 2022-08-12 DIAGNOSIS — R0789 Other chest pain: Secondary | ICD-10-CM | POA: Diagnosis not present

## 2022-08-12 LAB — CBC
HCT: 39.2 % (ref 39.0–52.0)
Hemoglobin: 12.7 g/dL — ABNORMAL LOW (ref 13.0–17.0)
MCH: 32.9 pg (ref 26.0–34.0)
MCHC: 32.4 g/dL (ref 30.0–36.0)
MCV: 101.6 fL — ABNORMAL HIGH (ref 80.0–100.0)
Platelets: 109 10*3/uL — ABNORMAL LOW (ref 150–400)
RBC: 3.86 MIL/uL — ABNORMAL LOW (ref 4.22–5.81)
RDW: 15.8 % — ABNORMAL HIGH (ref 11.5–15.5)
WBC: 3.7 10*3/uL — ABNORMAL LOW (ref 4.0–10.5)
nRBC: 0 % (ref 0.0–0.2)

## 2022-08-12 LAB — BASIC METABOLIC PANEL
Anion gap: 17 — ABNORMAL HIGH (ref 5–15)
BUN: 58 mg/dL — ABNORMAL HIGH (ref 8–23)
CO2: 25 mmol/L (ref 22–32)
Calcium: 9.5 mg/dL (ref 8.9–10.3)
Chloride: 94 mmol/L — ABNORMAL LOW (ref 98–111)
Creatinine, Ser: 8.43 mg/dL — ABNORMAL HIGH (ref 0.61–1.24)
GFR, Estimated: 6 mL/min — ABNORMAL LOW (ref >60)
Glucose, Bld: 96 mg/dL (ref 70–99)
Potassium: 4.3 mmol/L (ref 3.5–5.1)
Sodium: 136 mmol/L (ref 135–145)

## 2022-08-12 LAB — TROPONIN I (HIGH SENSITIVITY)
Troponin I (High Sensitivity): 29 ng/L — ABNORMAL HIGH (ref <18)
Troponin I (High Sensitivity): 26 ng/L — ABNORMAL HIGH (ref <18)

## 2022-08-12 MED ORDER — METHOCARBAMOL 500 MG PO TABS
750.0000 mg | ORAL_TABLET | Freq: Once | ORAL | Status: AC
  Administered 2022-08-12: 750 mg via ORAL
  Filled 2022-08-12: qty 2

## 2022-08-12 MED ORDER — METHOCARBAMOL 500 MG PO TABS: 500.0000 mg | ORAL_TABLET | Freq: Three times a day (TID) | ORAL | 0 refills | Status: AC | PRN

## 2022-08-12 NOTE — Discharge Instructions (Signed)
Continue Tylenol as needed for pain.  A prescription for a muscle relaxer was sent to your pharmacy as well.  Take this as needed.  You should hear from the cardiology office in the next couple days to establish cardiology care here in [place].  If you do not hear from them, call the number below.  Return the emergency department for any new or worsening symptoms of concern.

## 2022-08-12 NOTE — ED Provider Notes (Signed)
[facility] EMERGENCY DEPARTMENT AT [facility] Provider Note   CSN: [id number] Arrival date & time: 08/12/22  4010     History  Chief Complaint  Patient presents with   Chest Pain    Jonathan Mcneil is a 67 y.o. male.   Chest Pain Patient presents for chest pain.  Medical history includes anemia, ESRD, DM, neuropathy, prior pancreatitis, schizophrenia, GERD.  Chest pain has been present for the past 4 days.  It is located on the upper aspect of his left lateral chest.  He denies any recent injuries.  He denies any associated shortness of breath, nausea, diaphoresis, or dizziness.  Patient is currently getting worked up for renal transplant.  He did undergo cardiology workup at [facility].  This is the first time he is ever seen a cardiologist.  He was told that he has heart failure and was started on new medications.  Last HD session was 2 days ago.     Home Medications Prior to Admission medications   Medication Sig Start Date End Date Taking? Authorizing Provider  methocarbamol (ROBAXIN) 500 MG tablet Take 1 tablet (500 mg total) by mouth every 8 (eight) hours as needed for muscle spasms. 08/12/22  Yes Gloris Manchester, MD  acetaminophen (TYLENOL) 650 MG CR tablet Take 1,300 mg by mouth daily as needed for pain.    [provider]  atorvastatin (LIPITOR) 20 MG tablet Take 20 mg by mouth daily.    [provider]  dorzolamide-timolol (COSOPT) 2-0.5 % ophthalmic solution Place 1 drop into both eyes 2 (two) times daily. 03/29/22   Rennis Chris, MD  ENTRESTO 24-26 MG Take 1 tablet by mouth 2 (two) times daily. 10/11/21   [provider]  gabapentin (NEURONTIN) 300 MG capsule Take 300 mg by mouth at bedtime. 09/20/20   [provider]  loperamide (IMODIUM) 2 MG capsule Take 1 capsule (2 mg total) by mouth 4 (four) times daily as needed for diarrhea or loose stools. 10/23/21   Long, Arlyss Repress, MD  metoprolol succinate (TOPROL-XL) 25 MG 24 hr tablet Take  37.5 mg by mouth daily.    [provider]  ondansetron (ZOFRAN-ODT) 4 MG disintegrating tablet Take 1 tablet (4 mg total) by mouth every 8 (eight) hours as needed for nausea or vomiting. 10/23/21   Long, Arlyss Repress, MD  pantoprazole (PROTONIX) 40 MG tablet Take 1 tablet (40 mg total) by mouth 2 (two) times daily before a meal. Twice a day x 4 weeks, then daily 06/17/21   [name], [name], MD  sertraline (ZOLOFT) 25 MG tablet Take 25 mg by mouth daily as needed (depression). 11/14/20   [provider]  sucroferric oxyhydroxide (VELPHORO) 500 MG chewable tablet Chew 1,500 mg by mouth 3 (three) times daily with meals. 10/12/19   [provider]      Allergies    Gabapentin, Ibuprofen, Penicillins, and Venlafaxine    Review of Systems   Review of Systems  Cardiovascular:  Positive for chest pain.  All other systems reviewed and are negative.   Physical Exam Updated Vital Signs BP 122/83 (BP Location: Right Arm)   Pulse 69   Temp 97.9 F (36.6 C) (Oral)   Resp 16   SpO2 100%  Physical Exam Vitals and nursing note reviewed.  Constitutional:      General: He is not in acute distress.    Appearance: He is well-developed. He is not ill-appearing, toxic-appearing or diaphoretic.  HENT:     Head:  Normocephalic and atraumatic.  Eyes:     Conjunctiva/sclera: Conjunctivae normal.  Neck:     Vascular: No JVD.  Cardiovascular:     Rate and Rhythm: Normal rate and regular rhythm.     Heart sounds: Normal heart sounds. No murmur heard. Pulmonary:     Effort: Pulmonary effort is normal. No tachypnea or respiratory distress.     Breath sounds: Normal breath sounds. No decreased breath sounds, wheezing, rhonchi or rales.  Chest:     Chest wall: Tenderness present.  Abdominal:     Palpations: Abdomen is soft.     Tenderness: There is no abdominal tenderness.  Musculoskeletal:        General: No swelling. Normal range of motion.     Cervical back: Normal range of motion  and neck supple.     Right lower leg: No edema.     Left lower leg: No edema.  Skin:    General: Skin is warm and dry.     Coloration: Skin is not cyanotic or pale.  Neurological:     General: No focal deficit present.     Mental Status: He is alert and oriented to person, place, and time.  Psychiatric:        Mood and Affect: Mood normal.        Behavior: Behavior normal.     ED Results / Procedures / Treatments   Labs (all labs ordered are listed, but only abnormal results are displayed) Labs Reviewed  BASIC METABOLIC PANEL - Abnormal; Notable for the following components:      Result Value   Chloride 94 (*)    BUN 58 (*)    Creatinine, Ser 8.43 (*)    GFR, Estimated 6 (*)    Anion gap 17 (*)    All other components within normal limits  CBC - Abnormal; Notable for the following components:   WBC 3.7 (*)    RBC 3.86 (*)    Hemoglobin 12.7 (*)    MCV 101.6 (*)    RDW 15.8 (*)    Platelets 109 (*)    All other components within normal limits  TROPONIN I (HIGH SENSITIVITY) - Abnormal; Notable for the following components:   Troponin I (High Sensitivity) 29 (*)    All other components within normal limits  TROPONIN I (HIGH SENSITIVITY) - Abnormal; Notable for the following components:   Troponin I (High Sensitivity) 26 (*)    All other components within normal limits  PROTIME-INR    EKG None  Radiology DG Chest 2 View  Result Date: 08/12/2022 CLINICAL DATA:  Chest pain EXAM: CHEST - 2 VIEW COMPARISON:  CXR 06/15/21. FINDINGS: Cardiomegaly. No pleural effusion. No pneumothorax. No focal airspace opacity. Mild pulmonary venous congestion. No radiographically apparent displaced rib fractures. Visualized upper abdomen is unremarkable. IMPRESSION: Cardiomegaly with mild pulmonary venous congestion. No evidence of pleural effusion or overt pulmonary edema. Electronically Signed   By: Lorenza Cambridge M.D.   On: 08/12/2022 10:12    Procedures Procedures    Medications  Ordered in ED Medications  methocarbamol (ROBAXIN) tablet 750 mg (750 mg Oral Given 08/12/22 1234)    ED Course/ Medical Decision Making/ A&P                             Medical Decision Making Amount and/or Complexity of Data Reviewed Labs: ordered. Radiology: ordered.  Risk Prescription drug management.   This patient presents to  the ED for concern of pain, this involves an extensive number of treatment options, and is a complaint that carries with it a high risk of complications and morbidity.  The differential diagnosis includes ACS, PE, musculoskeletal strain, pneumonia   Co morbidities that complicate the patient evaluation  anemia, ESRD, DM, neuropathy, prior pancreatitis, schizophrenia, GERD   Additional history obtained:  Additional history obtained from N/A External records from outside source obtained and reviewed including EMR   Lab Tests:  I Ordered, and personally interpreted labs.  The pertinent results include: Baseline hemoglobin, slight leukopenia, elevated BUN and creatinine consistent with ESRD, slightly elevated troponins consistent with ESRD, stable on repeat   Imaging Studies ordered:  I ordered imaging studies including chest x-ray I independently visualized and interpreted imaging which showed  no acute findings I agree with the radiologist interpretation   Cardiac Monitoring: / EKG:  The patient was maintained on a cardiac monitor.  I personally viewed and interpreted the cardiac monitored which showed an underlying rhythm of: sinus rhythm  Problem List / ED Course / Critical interventions / Medication management  Patient presents for 4 to 5 days of left lateral chest wall pain.  Vital signs are normal on arrival in the ED.  On exam, area pain is reproducible to palpation.  Breathing is unlabored and lungs are clear to auscultation.  Lab work, initiated prior to patient being bedded in the ED, shows very slight elevation in troponin, which is  expected in the setting of ESRD.  EG shows no significant change from prior tracings.  At home, patient takes Tylenol only.  In the ED today, Robaxin was ordered for symptomatic relief. Second troponin showed no increase.  Patient had improved symptoms after dose of Robaxin.  He was prescribed this to take as needed at home.  Currently, he does wish to establish care with cardiology here in [place], where he lives.  Cardiology referral was ordered.  Patient was discharged in stable condition. I ordered medication including Robaxin for analgesia Reevaluation of the patient after these medicines showed that the patient improved I have reviewed the patients home medicines and have made adjustments as needed   Social Determinants of Health:  Has access to outpatient care         Final Clinical Impression(s) / ED Diagnoses Final diagnoses:  Chest pain, unspecified type    Rx / DC Orders ED Discharge Orders          Ordered    Ambulatory referral to Cardiology       Comments: If you have not heard from the Cardiology office within the next 72 hours please call [phone].   08/12/22 1550    methocarbamol (ROBAXIN) 500 MG tablet  Every 8 hours PRN        08/12/22 1550              Gloris Manchester, MD 08/12/22 1551

## 2022-08-12 NOTE — ED Triage Notes (Addendum)
Patient complains of chest pain x 4 days. States that he has been waiting on kidney. Had full evaluation and he was told heart is fine. Patient last dialysis Saturday. Alert and oriented, speaking complete sentences, NAD. No sob

## 2022-08-13 DIAGNOSIS — E1129 Type 2 diabetes mellitus with other diabetic kidney complication: Secondary | ICD-10-CM | POA: Diagnosis not present

## 2022-08-13 DIAGNOSIS — L299 Pruritus, unspecified: Secondary | ICD-10-CM | POA: Diagnosis not present

## 2022-08-13 DIAGNOSIS — R52 Pain, unspecified: Secondary | ICD-10-CM | POA: Diagnosis not present

## 2022-08-13 DIAGNOSIS — Z992 Dependence on renal dialysis: Secondary | ICD-10-CM | POA: Diagnosis not present

## 2022-08-13 DIAGNOSIS — D631 Anemia in chronic kidney disease: Secondary | ICD-10-CM | POA: Diagnosis not present

## 2022-08-13 DIAGNOSIS — N186 End stage renal disease: Secondary | ICD-10-CM | POA: Diagnosis not present

## 2022-08-13 DIAGNOSIS — N2581 Secondary hyperparathyroidism of renal origin: Secondary | ICD-10-CM | POA: Diagnosis not present

## 2022-08-15 DIAGNOSIS — D631 Anemia in chronic kidney disease: Secondary | ICD-10-CM | POA: Diagnosis not present

## 2022-08-15 DIAGNOSIS — N2581 Secondary hyperparathyroidism of renal origin: Secondary | ICD-10-CM | POA: Diagnosis not present

## 2022-08-15 DIAGNOSIS — E1129 Type 2 diabetes mellitus with other diabetic kidney complication: Secondary | ICD-10-CM | POA: Diagnosis not present

## 2022-08-15 DIAGNOSIS — R52 Pain, unspecified: Secondary | ICD-10-CM | POA: Diagnosis not present

## 2022-08-15 DIAGNOSIS — Z992 Dependence on renal dialysis: Secondary | ICD-10-CM | POA: Diagnosis not present

## 2022-08-15 DIAGNOSIS — L299 Pruritus, unspecified: Secondary | ICD-10-CM | POA: Diagnosis not present

## 2022-08-15 DIAGNOSIS — N186 End stage renal disease: Secondary | ICD-10-CM | POA: Diagnosis not present

## 2022-08-17 DIAGNOSIS — R52 Pain, unspecified: Secondary | ICD-10-CM | POA: Diagnosis not present

## 2022-08-17 DIAGNOSIS — Z992 Dependence on renal dialysis: Secondary | ICD-10-CM | POA: Diagnosis not present

## 2022-08-17 DIAGNOSIS — N186 End stage renal disease: Secondary | ICD-10-CM | POA: Diagnosis not present

## 2022-08-17 DIAGNOSIS — N2581 Secondary hyperparathyroidism of renal origin: Secondary | ICD-10-CM | POA: Diagnosis not present

## 2022-08-17 DIAGNOSIS — D631 Anemia in chronic kidney disease: Secondary | ICD-10-CM | POA: Diagnosis not present

## 2022-08-17 DIAGNOSIS — E1129 Type 2 diabetes mellitus with other diabetic kidney complication: Secondary | ICD-10-CM | POA: Diagnosis not present

## 2022-08-17 DIAGNOSIS — L299 Pruritus, unspecified: Secondary | ICD-10-CM | POA: Diagnosis not present

## 2022-08-20 DIAGNOSIS — E1129 Type 2 diabetes mellitus with other diabetic kidney complication: Secondary | ICD-10-CM | POA: Diagnosis not present

## 2022-08-20 DIAGNOSIS — D631 Anemia in chronic kidney disease: Secondary | ICD-10-CM | POA: Diagnosis not present

## 2022-08-20 DIAGNOSIS — N186 End stage renal disease: Secondary | ICD-10-CM | POA: Diagnosis not present

## 2022-08-20 DIAGNOSIS — Z992 Dependence on renal dialysis: Secondary | ICD-10-CM | POA: Diagnosis not present

## 2022-08-20 DIAGNOSIS — L299 Pruritus, unspecified: Secondary | ICD-10-CM | POA: Diagnosis not present

## 2022-08-20 DIAGNOSIS — R52 Pain, unspecified: Secondary | ICD-10-CM | POA: Diagnosis not present

## 2022-08-20 DIAGNOSIS — N2581 Secondary hyperparathyroidism of renal origin: Secondary | ICD-10-CM | POA: Diagnosis not present

## 2022-08-22 DIAGNOSIS — N2581 Secondary hyperparathyroidism of renal origin: Secondary | ICD-10-CM | POA: Diagnosis not present

## 2022-08-22 DIAGNOSIS — N186 End stage renal disease: Secondary | ICD-10-CM | POA: Diagnosis not present

## 2022-08-22 DIAGNOSIS — L299 Pruritus, unspecified: Secondary | ICD-10-CM | POA: Diagnosis not present

## 2022-08-22 DIAGNOSIS — R52 Pain, unspecified: Secondary | ICD-10-CM | POA: Diagnosis not present

## 2022-08-22 DIAGNOSIS — E1129 Type 2 diabetes mellitus with other diabetic kidney complication: Secondary | ICD-10-CM | POA: Diagnosis not present

## 2022-08-22 DIAGNOSIS — D631 Anemia in chronic kidney disease: Secondary | ICD-10-CM | POA: Diagnosis not present

## 2022-08-22 DIAGNOSIS — Z992 Dependence on renal dialysis: Secondary | ICD-10-CM | POA: Diagnosis not present

## 2022-08-23 DIAGNOSIS — N186 End stage renal disease: Secondary | ICD-10-CM | POA: Diagnosis not present

## 2022-08-23 DIAGNOSIS — E1122 Type 2 diabetes mellitus with diabetic chronic kidney disease: Secondary | ICD-10-CM | POA: Diagnosis not present

## 2022-08-23 DIAGNOSIS — Z992 Dependence on renal dialysis: Secondary | ICD-10-CM | POA: Diagnosis not present

## 2022-08-24 DIAGNOSIS — R197 Diarrhea, unspecified: Secondary | ICD-10-CM | POA: Diagnosis not present

## 2022-08-24 DIAGNOSIS — R52 Pain, unspecified: Secondary | ICD-10-CM | POA: Diagnosis not present

## 2022-08-24 DIAGNOSIS — D631 Anemia in chronic kidney disease: Secondary | ICD-10-CM | POA: Diagnosis not present

## 2022-08-24 DIAGNOSIS — N186 End stage renal disease: Secondary | ICD-10-CM | POA: Diagnosis not present

## 2022-08-24 DIAGNOSIS — N2581 Secondary hyperparathyroidism of renal origin: Secondary | ICD-10-CM | POA: Diagnosis not present

## 2022-08-24 DIAGNOSIS — Z992 Dependence on renal dialysis: Secondary | ICD-10-CM | POA: Diagnosis not present

## 2022-08-24 DIAGNOSIS — E1129 Type 2 diabetes mellitus with other diabetic kidney complication: Secondary | ICD-10-CM | POA: Diagnosis not present

## 2022-08-24 DIAGNOSIS — L299 Pruritus, unspecified: Secondary | ICD-10-CM | POA: Diagnosis not present

## 2022-08-27 DIAGNOSIS — D631 Anemia in chronic kidney disease: Secondary | ICD-10-CM | POA: Diagnosis not present

## 2022-08-27 DIAGNOSIS — R197 Diarrhea, unspecified: Secondary | ICD-10-CM | POA: Diagnosis not present

## 2022-08-27 DIAGNOSIS — N186 End stage renal disease: Secondary | ICD-10-CM | POA: Diagnosis not present

## 2022-08-27 DIAGNOSIS — L299 Pruritus, unspecified: Secondary | ICD-10-CM | POA: Diagnosis not present

## 2022-08-27 DIAGNOSIS — E1129 Type 2 diabetes mellitus with other diabetic kidney complication: Secondary | ICD-10-CM | POA: Diagnosis not present

## 2022-08-27 DIAGNOSIS — N2581 Secondary hyperparathyroidism of renal origin: Secondary | ICD-10-CM | POA: Diagnosis not present

## 2022-08-27 DIAGNOSIS — Z992 Dependence on renal dialysis: Secondary | ICD-10-CM | POA: Diagnosis not present

## 2022-08-27 DIAGNOSIS — R52 Pain, unspecified: Secondary | ICD-10-CM | POA: Diagnosis not present

## 2022-08-29 DIAGNOSIS — N186 End stage renal disease: Secondary | ICD-10-CM | POA: Diagnosis not present

## 2022-08-29 DIAGNOSIS — N2581 Secondary hyperparathyroidism of renal origin: Secondary | ICD-10-CM | POA: Diagnosis not present

## 2022-08-29 DIAGNOSIS — D631 Anemia in chronic kidney disease: Secondary | ICD-10-CM | POA: Diagnosis not present

## 2022-08-29 DIAGNOSIS — E1129 Type 2 diabetes mellitus with other diabetic kidney complication: Secondary | ICD-10-CM | POA: Diagnosis not present

## 2022-08-29 DIAGNOSIS — L299 Pruritus, unspecified: Secondary | ICD-10-CM | POA: Diagnosis not present

## 2022-08-29 DIAGNOSIS — Z992 Dependence on renal dialysis: Secondary | ICD-10-CM | POA: Diagnosis not present

## 2022-08-29 DIAGNOSIS — R52 Pain, unspecified: Secondary | ICD-10-CM | POA: Diagnosis not present

## 2022-08-29 DIAGNOSIS — R197 Diarrhea, unspecified: Secondary | ICD-10-CM | POA: Diagnosis not present

## 2022-08-31 DIAGNOSIS — R197 Diarrhea, unspecified: Secondary | ICD-10-CM | POA: Diagnosis not present

## 2022-08-31 DIAGNOSIS — R52 Pain, unspecified: Secondary | ICD-10-CM | POA: Diagnosis not present

## 2022-08-31 DIAGNOSIS — L299 Pruritus, unspecified: Secondary | ICD-10-CM | POA: Diagnosis not present

## 2022-08-31 DIAGNOSIS — N186 End stage renal disease: Secondary | ICD-10-CM | POA: Diagnosis not present

## 2022-08-31 DIAGNOSIS — D631 Anemia in chronic kidney disease: Secondary | ICD-10-CM | POA: Diagnosis not present

## 2022-08-31 DIAGNOSIS — N2581 Secondary hyperparathyroidism of renal origin: Secondary | ICD-10-CM | POA: Diagnosis not present

## 2022-08-31 DIAGNOSIS — Z992 Dependence on renal dialysis: Secondary | ICD-10-CM | POA: Diagnosis not present

## 2022-08-31 DIAGNOSIS — E1129 Type 2 diabetes mellitus with other diabetic kidney complication: Secondary | ICD-10-CM | POA: Diagnosis not present

## 2022-09-03 DIAGNOSIS — E1129 Type 2 diabetes mellitus with other diabetic kidney complication: Secondary | ICD-10-CM | POA: Diagnosis not present

## 2022-09-03 DIAGNOSIS — N186 End stage renal disease: Secondary | ICD-10-CM | POA: Diagnosis not present

## 2022-09-03 DIAGNOSIS — Z992 Dependence on renal dialysis: Secondary | ICD-10-CM | POA: Diagnosis not present

## 2022-09-03 DIAGNOSIS — N2581 Secondary hyperparathyroidism of renal origin: Secondary | ICD-10-CM | POA: Diagnosis not present

## 2022-09-03 DIAGNOSIS — R197 Diarrhea, unspecified: Secondary | ICD-10-CM | POA: Diagnosis not present

## 2022-09-03 DIAGNOSIS — D631 Anemia in chronic kidney disease: Secondary | ICD-10-CM | POA: Diagnosis not present

## 2022-09-03 DIAGNOSIS — L299 Pruritus, unspecified: Secondary | ICD-10-CM | POA: Diagnosis not present

## 2022-09-03 DIAGNOSIS — R52 Pain, unspecified: Secondary | ICD-10-CM | POA: Diagnosis not present

## 2022-09-05 DIAGNOSIS — E1129 Type 2 diabetes mellitus with other diabetic kidney complication: Secondary | ICD-10-CM | POA: Diagnosis not present

## 2022-09-05 DIAGNOSIS — Z992 Dependence on renal dialysis: Secondary | ICD-10-CM | POA: Diagnosis not present

## 2022-09-05 DIAGNOSIS — R197 Diarrhea, unspecified: Secondary | ICD-10-CM | POA: Diagnosis not present

## 2022-09-05 DIAGNOSIS — N186 End stage renal disease: Secondary | ICD-10-CM | POA: Diagnosis not present

## 2022-09-05 DIAGNOSIS — D631 Anemia in chronic kidney disease: Secondary | ICD-10-CM | POA: Diagnosis not present

## 2022-09-05 DIAGNOSIS — N2581 Secondary hyperparathyroidism of renal origin: Secondary | ICD-10-CM | POA: Diagnosis not present

## 2022-09-05 DIAGNOSIS — L299 Pruritus, unspecified: Secondary | ICD-10-CM | POA: Diagnosis not present

## 2022-09-05 DIAGNOSIS — R52 Pain, unspecified: Secondary | ICD-10-CM | POA: Diagnosis not present

## 2022-09-07 DIAGNOSIS — R52 Pain, unspecified: Secondary | ICD-10-CM | POA: Diagnosis not present

## 2022-09-07 DIAGNOSIS — Z992 Dependence on renal dialysis: Secondary | ICD-10-CM | POA: Diagnosis not present

## 2022-09-07 DIAGNOSIS — R197 Diarrhea, unspecified: Secondary | ICD-10-CM | POA: Diagnosis not present

## 2022-09-07 DIAGNOSIS — L299 Pruritus, unspecified: Secondary | ICD-10-CM | POA: Diagnosis not present

## 2022-09-07 DIAGNOSIS — E1129 Type 2 diabetes mellitus with other diabetic kidney complication: Secondary | ICD-10-CM | POA: Diagnosis not present

## 2022-09-07 DIAGNOSIS — N186 End stage renal disease: Secondary | ICD-10-CM | POA: Diagnosis not present

## 2022-09-07 DIAGNOSIS — D631 Anemia in chronic kidney disease: Secondary | ICD-10-CM | POA: Diagnosis not present

## 2022-09-07 DIAGNOSIS — N2581 Secondary hyperparathyroidism of renal origin: Secondary | ICD-10-CM | POA: Diagnosis not present

## 2022-09-09 ENCOUNTER — Ambulatory Visit: Payer: Medicare Other | Admitting: Cardiovascular Disease

## 2022-09-10 DIAGNOSIS — D631 Anemia in chronic kidney disease: Secondary | ICD-10-CM | POA: Diagnosis not present

## 2022-09-10 DIAGNOSIS — N2581 Secondary hyperparathyroidism of renal origin: Secondary | ICD-10-CM | POA: Diagnosis not present

## 2022-09-10 DIAGNOSIS — L299 Pruritus, unspecified: Secondary | ICD-10-CM | POA: Diagnosis not present

## 2022-09-10 DIAGNOSIS — Z992 Dependence on renal dialysis: Secondary | ICD-10-CM | POA: Diagnosis not present

## 2022-09-10 DIAGNOSIS — R197 Diarrhea, unspecified: Secondary | ICD-10-CM | POA: Diagnosis not present

## 2022-09-10 DIAGNOSIS — R52 Pain, unspecified: Secondary | ICD-10-CM | POA: Diagnosis not present

## 2022-09-10 DIAGNOSIS — N186 End stage renal disease: Secondary | ICD-10-CM | POA: Diagnosis not present

## 2022-09-10 DIAGNOSIS — E1129 Type 2 diabetes mellitus with other diabetic kidney complication: Secondary | ICD-10-CM | POA: Diagnosis not present

## 2022-09-12 DIAGNOSIS — L299 Pruritus, unspecified: Secondary | ICD-10-CM | POA: Diagnosis not present

## 2022-09-12 DIAGNOSIS — N2581 Secondary hyperparathyroidism of renal origin: Secondary | ICD-10-CM | POA: Diagnosis not present

## 2022-09-12 DIAGNOSIS — R197 Diarrhea, unspecified: Secondary | ICD-10-CM | POA: Diagnosis not present

## 2022-09-12 DIAGNOSIS — N186 End stage renal disease: Secondary | ICD-10-CM | POA: Diagnosis not present

## 2022-09-12 DIAGNOSIS — R52 Pain, unspecified: Secondary | ICD-10-CM | POA: Diagnosis not present

## 2022-09-12 DIAGNOSIS — D631 Anemia in chronic kidney disease: Secondary | ICD-10-CM | POA: Diagnosis not present

## 2022-09-12 DIAGNOSIS — Z992 Dependence on renal dialysis: Secondary | ICD-10-CM | POA: Diagnosis not present

## 2022-09-12 DIAGNOSIS — E1129 Type 2 diabetes mellitus with other diabetic kidney complication: Secondary | ICD-10-CM | POA: Diagnosis not present

## 2022-09-14 DIAGNOSIS — L299 Pruritus, unspecified: Secondary | ICD-10-CM | POA: Diagnosis not present

## 2022-09-14 DIAGNOSIS — N186 End stage renal disease: Secondary | ICD-10-CM | POA: Diagnosis not present

## 2022-09-14 DIAGNOSIS — N2581 Secondary hyperparathyroidism of renal origin: Secondary | ICD-10-CM | POA: Diagnosis not present

## 2022-09-14 DIAGNOSIS — D631 Anemia in chronic kidney disease: Secondary | ICD-10-CM | POA: Diagnosis not present

## 2022-09-14 DIAGNOSIS — E1129 Type 2 diabetes mellitus with other diabetic kidney complication: Secondary | ICD-10-CM | POA: Diagnosis not present

## 2022-09-14 DIAGNOSIS — R52 Pain, unspecified: Secondary | ICD-10-CM | POA: Diagnosis not present

## 2022-09-14 DIAGNOSIS — R197 Diarrhea, unspecified: Secondary | ICD-10-CM | POA: Diagnosis not present

## 2022-09-14 DIAGNOSIS — Z992 Dependence on renal dialysis: Secondary | ICD-10-CM | POA: Diagnosis not present

## 2022-09-15 IMAGING — CT CT ABD-PELV W/O CM
2 of 4 series · 16 of 46 positions shown, 18 images · non-contrast
Comparison: 03/19/2021

CLINICAL DATA: 66-year-old male with abdominal pain



[Series 3: ap without · axial · non-contrast · 0.80mm/px · z∈[+805,+1220]mm · 13 of 95 slices shown, 15 images]
[im 6/95  soft-tissue]
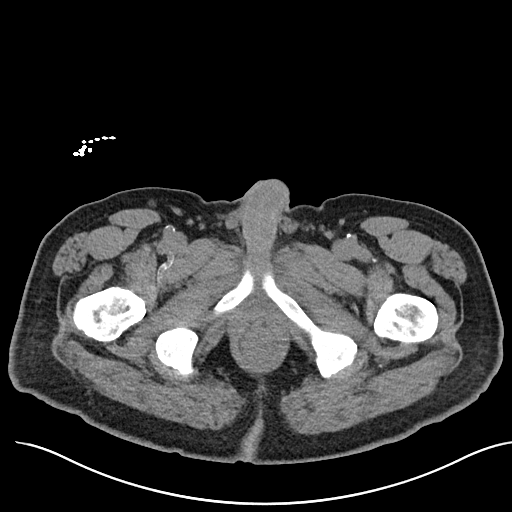
[im 6/95  bone]
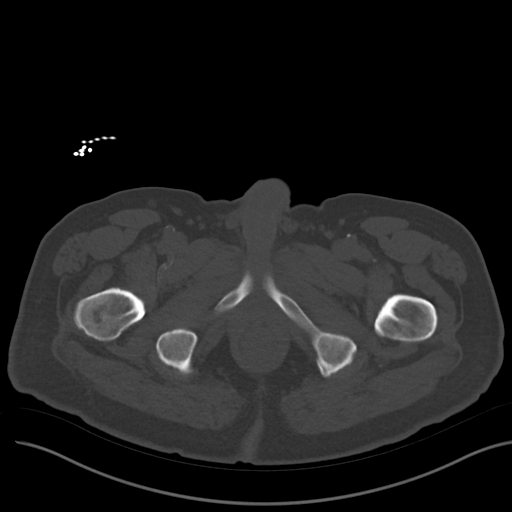
[im 11/95  soft-tissue]
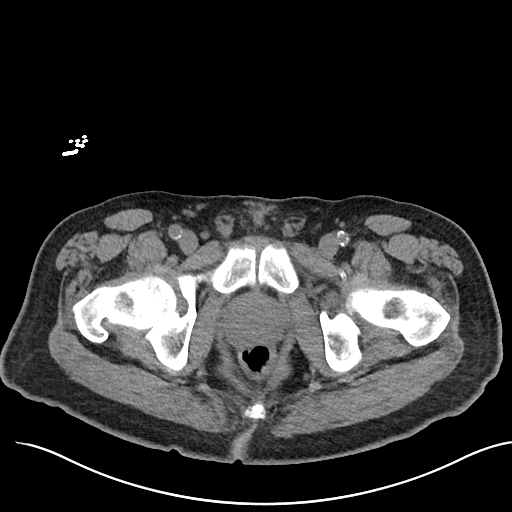
[im 21/95  soft-tissue]
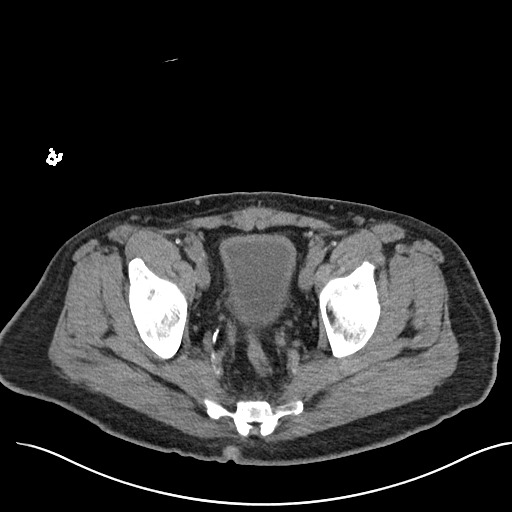
[im 27/95  soft-tissue]
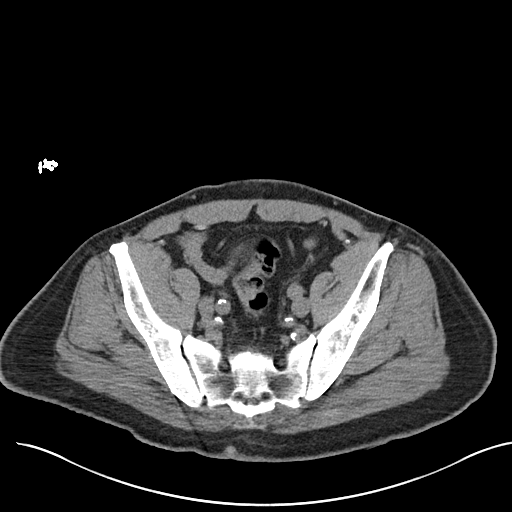
[im 32/95  soft-tissue]
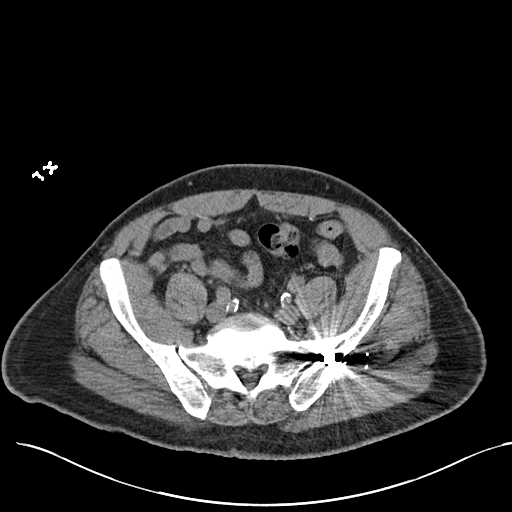
[im 42/95  soft-tissue]
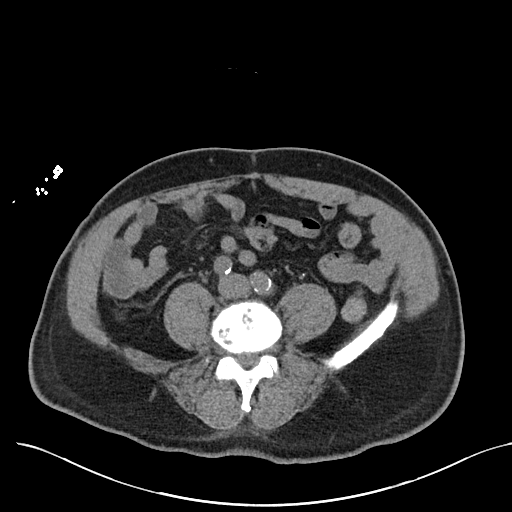
[im 48/95  soft-tissue]
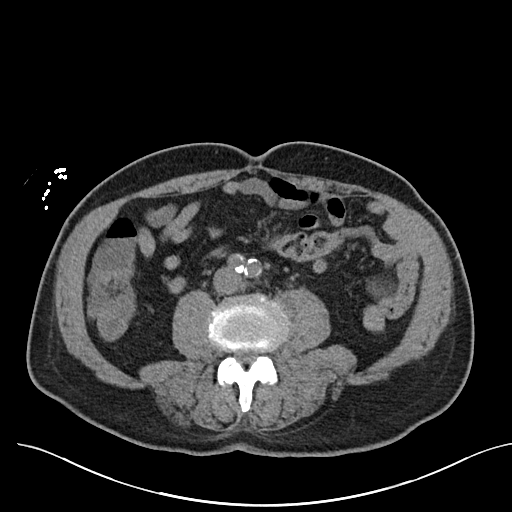
[im 53/95  soft-tissue]
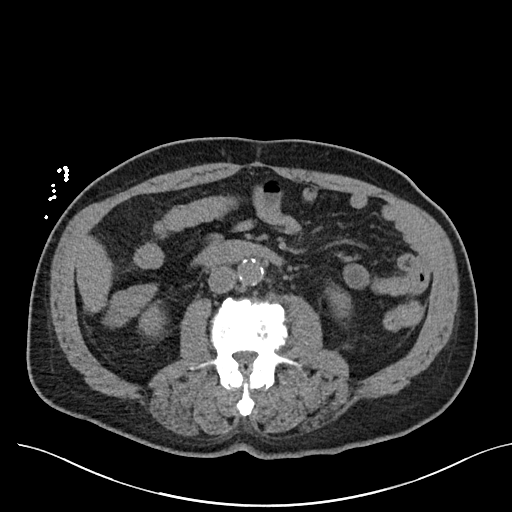
[im 63/95  soft-tissue]
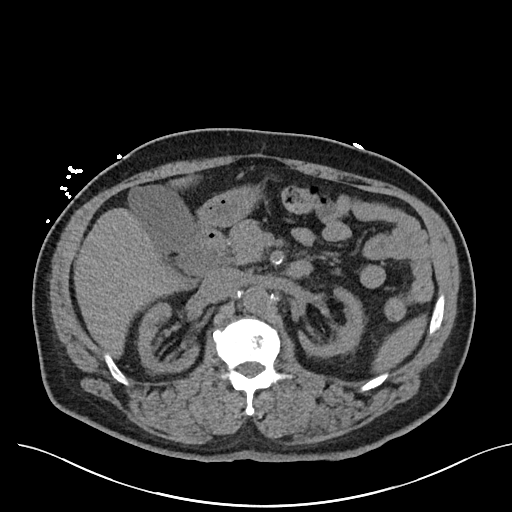
[im 63/95  bone]
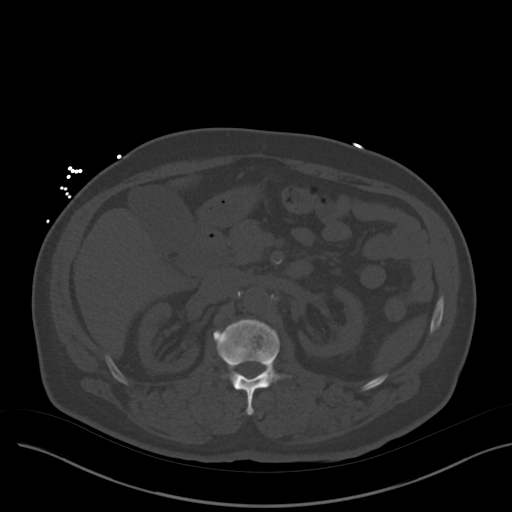
[im 68/95  soft-tissue]
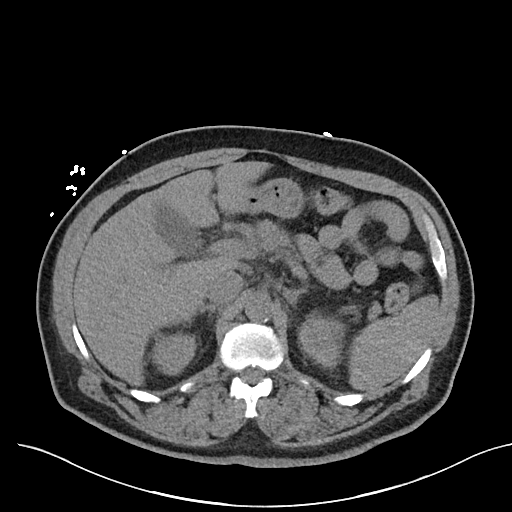
[im 74/95  soft-tissue]
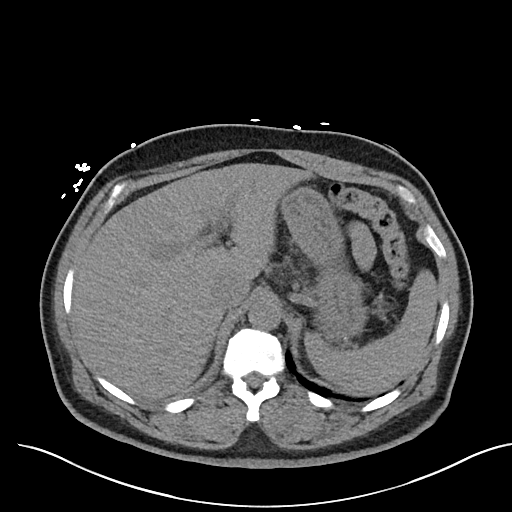
[im 84/95  soft-tissue]
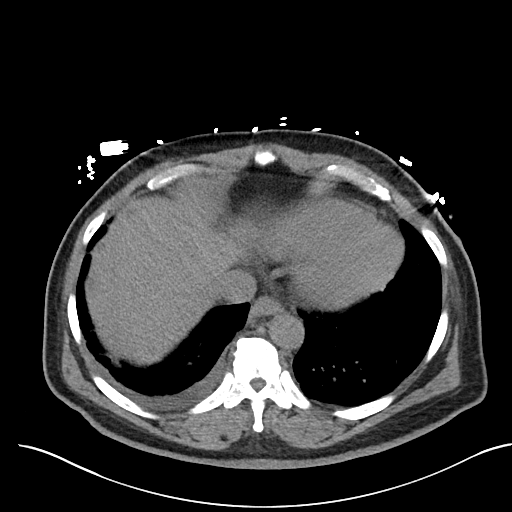
[im 89/95  soft-tissue]
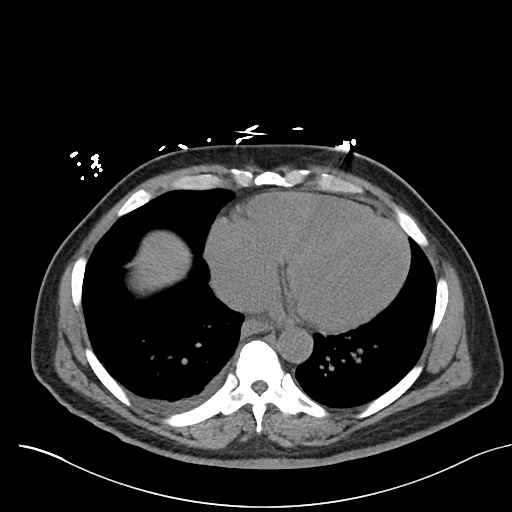

[Series 6: cor · coronal · 0.84mm/px · 3 of 95 slices shown]
[im 32/95  soft-tissue]
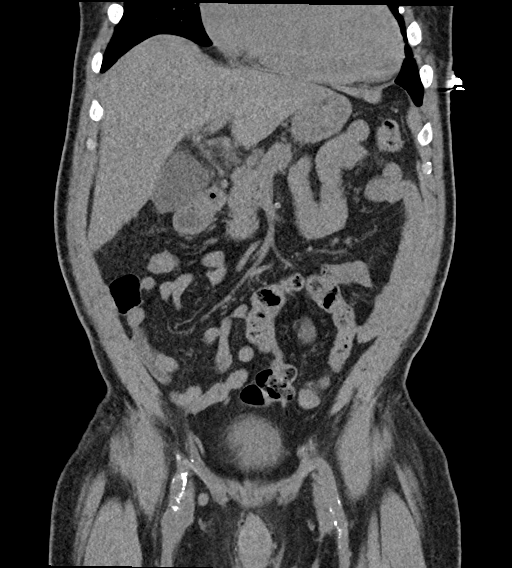
[im 42/95  soft-tissue]
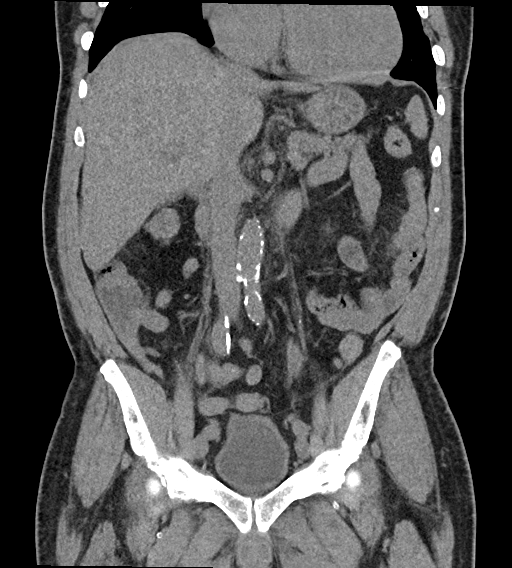
[im 53/95  soft-tissue]
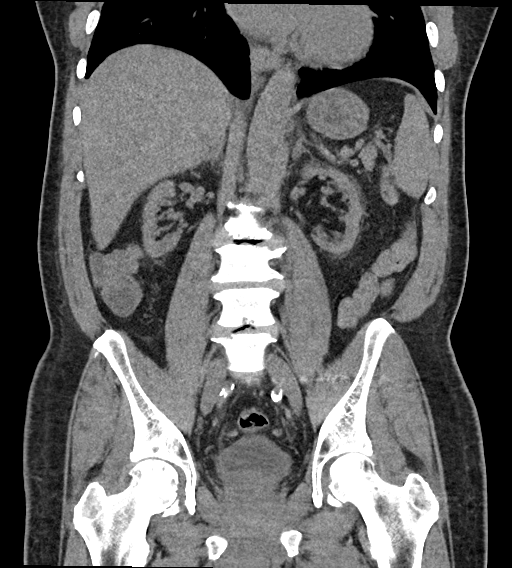

[16 of 46 positions shown; findings below may reference images not displayed]

FINDINGS: Lower chest: Small right-sided pleural effusion with associated
atelectasis.

Redemonstration of geographic ground-glass attenuation in the
dependent lower lungs, which was present on more remote chest CT,
and again favored to represent airway trapping/small airway
disease. No endobronchial debris or bronchial wall thickening. No
interlobular septal thickening.

Hepatobiliary: Unremarkable liver.  Unremarkable gallbladder.

Pancreas: Unremarkable

Spleen: Unremarkable

Adrenals/Urinary Tract: Unremarkable bilateral adrenal glands.

Similar appearance of renal cortical thinning. No evidence of
left-sided or right-sided hydronephrosis.

No nephrolithiasis.

Urinary bladder relatively decompressed.

Stomach/Bowel: Unremarkable stomach. Small bowel decompressed. No
transition point normal appendix. Colon decompressed. No
inflammatory changes. Mild left-sided diverticular change.

Vascular/Lymphatic: Atherosclerotic changes of the abdominal aorta.
No aneurysm.

No adenopathy.

Reproductive: Transverse diameter of the prostate measures 4.8 cm.

Other: Redemonstration of metallic shrapnel retained within the left
iliac bone and left gluteal musculature.

Musculoskeletal: No displaced fracture. Multilevel degenerative
changes of the spine. Advanced degenerative changes of L2-L3, as
well as L4-L5. Degenerative changes of the hips.
IMPRESSION: No acute finding of the abdomen to account for abdominal pain.

Small right-sided pleural effusion

Aortic Atherosclerosis ([id number]-ONL.L).

Additional ancillary findings as above.

## 2022-09-17 DIAGNOSIS — L299 Pruritus, unspecified: Secondary | ICD-10-CM | POA: Diagnosis not present

## 2022-09-17 DIAGNOSIS — R197 Diarrhea, unspecified: Secondary | ICD-10-CM | POA: Diagnosis not present

## 2022-09-17 DIAGNOSIS — Z992 Dependence on renal dialysis: Secondary | ICD-10-CM | POA: Diagnosis not present

## 2022-09-17 DIAGNOSIS — D631 Anemia in chronic kidney disease: Secondary | ICD-10-CM | POA: Diagnosis not present

## 2022-09-17 DIAGNOSIS — N186 End stage renal disease: Secondary | ICD-10-CM | POA: Diagnosis not present

## 2022-09-17 DIAGNOSIS — N2581 Secondary hyperparathyroidism of renal origin: Secondary | ICD-10-CM | POA: Diagnosis not present

## 2022-09-17 DIAGNOSIS — R52 Pain, unspecified: Secondary | ICD-10-CM | POA: Diagnosis not present

## 2022-09-17 DIAGNOSIS — E1129 Type 2 diabetes mellitus with other diabetic kidney complication: Secondary | ICD-10-CM | POA: Diagnosis not present

## 2022-09-19 DIAGNOSIS — N186 End stage renal disease: Secondary | ICD-10-CM | POA: Diagnosis not present

## 2022-09-19 DIAGNOSIS — D631 Anemia in chronic kidney disease: Secondary | ICD-10-CM | POA: Diagnosis not present

## 2022-09-19 DIAGNOSIS — R52 Pain, unspecified: Secondary | ICD-10-CM | POA: Diagnosis not present

## 2022-09-19 DIAGNOSIS — N2581 Secondary hyperparathyroidism of renal origin: Secondary | ICD-10-CM | POA: Diagnosis not present

## 2022-09-19 DIAGNOSIS — R197 Diarrhea, unspecified: Secondary | ICD-10-CM | POA: Diagnosis not present

## 2022-09-19 DIAGNOSIS — E1129 Type 2 diabetes mellitus with other diabetic kidney complication: Secondary | ICD-10-CM | POA: Diagnosis not present

## 2022-09-19 DIAGNOSIS — Z992 Dependence on renal dialysis: Secondary | ICD-10-CM | POA: Diagnosis not present

## 2022-09-19 DIAGNOSIS — L299 Pruritus, unspecified: Secondary | ICD-10-CM | POA: Diagnosis not present

## 2022-09-21 DIAGNOSIS — D631 Anemia in chronic kidney disease: Secondary | ICD-10-CM | POA: Diagnosis not present

## 2022-09-21 DIAGNOSIS — Z992 Dependence on renal dialysis: Secondary | ICD-10-CM | POA: Diagnosis not present

## 2022-09-21 DIAGNOSIS — L299 Pruritus, unspecified: Secondary | ICD-10-CM | POA: Diagnosis not present

## 2022-09-21 DIAGNOSIS — N2581 Secondary hyperparathyroidism of renal origin: Secondary | ICD-10-CM | POA: Diagnosis not present

## 2022-09-21 DIAGNOSIS — E1129 Type 2 diabetes mellitus with other diabetic kidney complication: Secondary | ICD-10-CM | POA: Diagnosis not present

## 2022-09-21 DIAGNOSIS — R52 Pain, unspecified: Secondary | ICD-10-CM | POA: Diagnosis not present

## 2022-09-21 DIAGNOSIS — R197 Diarrhea, unspecified: Secondary | ICD-10-CM | POA: Diagnosis not present

## 2022-09-21 DIAGNOSIS — N186 End stage renal disease: Secondary | ICD-10-CM | POA: Diagnosis not present

## 2022-09-22 DIAGNOSIS — Z992 Dependence on renal dialysis: Secondary | ICD-10-CM | POA: Diagnosis not present

## 2022-09-22 DIAGNOSIS — E1122 Type 2 diabetes mellitus with diabetic chronic kidney disease: Secondary | ICD-10-CM | POA: Diagnosis not present

## 2022-09-22 DIAGNOSIS — N186 End stage renal disease: Secondary | ICD-10-CM | POA: Diagnosis not present

## 2022-09-24 DIAGNOSIS — R197 Diarrhea, unspecified: Secondary | ICD-10-CM | POA: Diagnosis not present

## 2022-09-24 DIAGNOSIS — N2581 Secondary hyperparathyroidism of renal origin: Secondary | ICD-10-CM | POA: Diagnosis not present

## 2022-09-24 DIAGNOSIS — Z992 Dependence on renal dialysis: Secondary | ICD-10-CM | POA: Diagnosis not present

## 2022-09-24 DIAGNOSIS — E1129 Type 2 diabetes mellitus with other diabetic kidney complication: Secondary | ICD-10-CM | POA: Diagnosis not present

## 2022-09-24 DIAGNOSIS — D631 Anemia in chronic kidney disease: Secondary | ICD-10-CM | POA: Diagnosis not present

## 2022-09-24 DIAGNOSIS — N186 End stage renal disease: Secondary | ICD-10-CM | POA: Diagnosis not present

## 2022-09-26 DIAGNOSIS — R197 Diarrhea, unspecified: Secondary | ICD-10-CM | POA: Diagnosis not present

## 2022-09-26 DIAGNOSIS — Z992 Dependence on renal dialysis: Secondary | ICD-10-CM | POA: Diagnosis not present

## 2022-09-26 DIAGNOSIS — D631 Anemia in chronic kidney disease: Secondary | ICD-10-CM | POA: Diagnosis not present

## 2022-09-26 DIAGNOSIS — N186 End stage renal disease: Secondary | ICD-10-CM | POA: Diagnosis not present

## 2022-09-26 DIAGNOSIS — E1129 Type 2 diabetes mellitus with other diabetic kidney complication: Secondary | ICD-10-CM | POA: Diagnosis not present

## 2022-09-26 DIAGNOSIS — N2581 Secondary hyperparathyroidism of renal origin: Secondary | ICD-10-CM | POA: Diagnosis not present

## 2022-09-28 DIAGNOSIS — R197 Diarrhea, unspecified: Secondary | ICD-10-CM | POA: Diagnosis not present

## 2022-09-28 DIAGNOSIS — N2581 Secondary hyperparathyroidism of renal origin: Secondary | ICD-10-CM | POA: Diagnosis not present

## 2022-09-28 DIAGNOSIS — D631 Anemia in chronic kidney disease: Secondary | ICD-10-CM | POA: Diagnosis not present

## 2022-09-28 DIAGNOSIS — Z992 Dependence on renal dialysis: Secondary | ICD-10-CM | POA: Diagnosis not present

## 2022-09-28 DIAGNOSIS — N186 End stage renal disease: Secondary | ICD-10-CM | POA: Diagnosis not present

## 2022-09-28 DIAGNOSIS — E1129 Type 2 diabetes mellitus with other diabetic kidney complication: Secondary | ICD-10-CM | POA: Diagnosis not present

## 2022-10-01 DIAGNOSIS — D631 Anemia in chronic kidney disease: Secondary | ICD-10-CM | POA: Diagnosis not present

## 2022-10-01 DIAGNOSIS — R197 Diarrhea, unspecified: Secondary | ICD-10-CM | POA: Diagnosis not present

## 2022-10-01 DIAGNOSIS — Z992 Dependence on renal dialysis: Secondary | ICD-10-CM | POA: Diagnosis not present

## 2022-10-01 DIAGNOSIS — N186 End stage renal disease: Secondary | ICD-10-CM | POA: Diagnosis not present

## 2022-10-01 DIAGNOSIS — N2581 Secondary hyperparathyroidism of renal origin: Secondary | ICD-10-CM | POA: Diagnosis not present

## 2022-10-01 DIAGNOSIS — E1129 Type 2 diabetes mellitus with other diabetic kidney complication: Secondary | ICD-10-CM | POA: Diagnosis not present

## 2022-10-03 DIAGNOSIS — D631 Anemia in chronic kidney disease: Secondary | ICD-10-CM | POA: Diagnosis not present

## 2022-10-03 DIAGNOSIS — E1129 Type 2 diabetes mellitus with other diabetic kidney complication: Secondary | ICD-10-CM | POA: Diagnosis not present

## 2022-10-03 DIAGNOSIS — N186 End stage renal disease: Secondary | ICD-10-CM | POA: Diagnosis not present

## 2022-10-03 DIAGNOSIS — N2581 Secondary hyperparathyroidism of renal origin: Secondary | ICD-10-CM | POA: Diagnosis not present

## 2022-10-03 DIAGNOSIS — Z992 Dependence on renal dialysis: Secondary | ICD-10-CM | POA: Diagnosis not present

## 2022-10-03 DIAGNOSIS — R197 Diarrhea, unspecified: Secondary | ICD-10-CM | POA: Diagnosis not present

## 2022-10-05 DIAGNOSIS — R197 Diarrhea, unspecified: Secondary | ICD-10-CM | POA: Diagnosis not present

## 2022-10-05 DIAGNOSIS — N186 End stage renal disease: Secondary | ICD-10-CM | POA: Diagnosis not present

## 2022-10-05 DIAGNOSIS — E1129 Type 2 diabetes mellitus with other diabetic kidney complication: Secondary | ICD-10-CM | POA: Diagnosis not present

## 2022-10-05 DIAGNOSIS — D631 Anemia in chronic kidney disease: Secondary | ICD-10-CM | POA: Diagnosis not present

## 2022-10-05 DIAGNOSIS — Z992 Dependence on renal dialysis: Secondary | ICD-10-CM | POA: Diagnosis not present

## 2022-10-05 DIAGNOSIS — N2581 Secondary hyperparathyroidism of renal origin: Secondary | ICD-10-CM | POA: Diagnosis not present

## 2022-10-08 DIAGNOSIS — N186 End stage renal disease: Secondary | ICD-10-CM | POA: Diagnosis not present

## 2022-10-08 DIAGNOSIS — R197 Diarrhea, unspecified: Secondary | ICD-10-CM | POA: Diagnosis not present

## 2022-10-08 DIAGNOSIS — D631 Anemia in chronic kidney disease: Secondary | ICD-10-CM | POA: Diagnosis not present

## 2022-10-08 DIAGNOSIS — E1129 Type 2 diabetes mellitus with other diabetic kidney complication: Secondary | ICD-10-CM | POA: Diagnosis not present

## 2022-10-08 DIAGNOSIS — Z992 Dependence on renal dialysis: Secondary | ICD-10-CM | POA: Diagnosis not present

## 2022-10-08 DIAGNOSIS — N2581 Secondary hyperparathyroidism of renal origin: Secondary | ICD-10-CM | POA: Diagnosis not present

## 2022-10-10 DIAGNOSIS — N186 End stage renal disease: Secondary | ICD-10-CM | POA: Diagnosis not present

## 2022-10-10 DIAGNOSIS — D631 Anemia in chronic kidney disease: Secondary | ICD-10-CM | POA: Diagnosis not present

## 2022-10-10 DIAGNOSIS — Z992 Dependence on renal dialysis: Secondary | ICD-10-CM | POA: Diagnosis not present

## 2022-10-10 DIAGNOSIS — R197 Diarrhea, unspecified: Secondary | ICD-10-CM | POA: Diagnosis not present

## 2022-10-10 DIAGNOSIS — E1129 Type 2 diabetes mellitus with other diabetic kidney complication: Secondary | ICD-10-CM | POA: Diagnosis not present

## 2022-10-10 DIAGNOSIS — N2581 Secondary hyperparathyroidism of renal origin: Secondary | ICD-10-CM | POA: Diagnosis not present

## 2022-10-12 DIAGNOSIS — N2581 Secondary hyperparathyroidism of renal origin: Secondary | ICD-10-CM | POA: Diagnosis not present

## 2022-10-12 DIAGNOSIS — N186 End stage renal disease: Secondary | ICD-10-CM | POA: Diagnosis not present

## 2022-10-12 DIAGNOSIS — D631 Anemia in chronic kidney disease: Secondary | ICD-10-CM | POA: Diagnosis not present

## 2022-10-12 DIAGNOSIS — Z992 Dependence on renal dialysis: Secondary | ICD-10-CM | POA: Diagnosis not present

## 2022-10-12 DIAGNOSIS — R197 Diarrhea, unspecified: Secondary | ICD-10-CM | POA: Diagnosis not present

## 2022-10-12 DIAGNOSIS — E1129 Type 2 diabetes mellitus with other diabetic kidney complication: Secondary | ICD-10-CM | POA: Diagnosis not present

## 2022-10-17 ENCOUNTER — Observation Stay (HOSPITAL_COMMUNITY)
Admission: EM | Admit: 2022-10-17 | Discharge: 2022-10-18 | Disposition: A | Discharge status: Home / Self Care | Payer: Medicare Other | Attending: Emergency Medicine | Admitting: Emergency Medicine

## 2022-10-17 ENCOUNTER — Inpatient Hospital Stay (HOSPITAL_COMMUNITY): Payer: Medicare Other

## 2022-10-17 ENCOUNTER — Emergency Department (HOSPITAL_COMMUNITY): Payer: Medicare Other

## 2022-10-17 ENCOUNTER — Encounter (HOSPITAL_COMMUNITY): Payer: Self-pay | Admitting: Nephrology

## 2022-10-17 DIAGNOSIS — N186 End stage renal disease: Secondary | ICD-10-CM | POA: Insufficient documentation

## 2022-10-17 DIAGNOSIS — E1142 Type 2 diabetes mellitus with diabetic polyneuropathy: Secondary | ICD-10-CM | POA: Diagnosis not present

## 2022-10-17 DIAGNOSIS — R112 Nausea with vomiting, unspecified: Secondary | ICD-10-CM | POA: Diagnosis not present

## 2022-10-17 DIAGNOSIS — R1111 Vomiting without nausea: Secondary | ICD-10-CM | POA: Diagnosis not present

## 2022-10-17 DIAGNOSIS — R079 Chest pain, unspecified: Secondary | ICD-10-CM | POA: Diagnosis not present

## 2022-10-17 DIAGNOSIS — E1122 Type 2 diabetes mellitus with diabetic chronic kidney disease: Secondary | ICD-10-CM | POA: Insufficient documentation

## 2022-10-17 DIAGNOSIS — Z992 Dependence on renal dialysis: Secondary | ICD-10-CM | POA: Insufficient documentation

## 2022-10-17 DIAGNOSIS — I10 Essential (primary) hypertension: Secondary | ICD-10-CM | POA: Diagnosis not present

## 2022-10-17 DIAGNOSIS — N281 Cyst of kidney, acquired: Secondary | ICD-10-CM | POA: Diagnosis not present

## 2022-10-17 DIAGNOSIS — E113591 Type 2 diabetes mellitus with proliferative diabetic retinopathy without macular edema, right eye: Secondary | ICD-10-CM | POA: Insufficient documentation

## 2022-10-17 DIAGNOSIS — I700 Atherosclerosis of aorta: Secondary | ICD-10-CM | POA: Diagnosis not present

## 2022-10-17 DIAGNOSIS — Z79899 Other long term (current) drug therapy: Secondary | ICD-10-CM | POA: Diagnosis not present

## 2022-10-17 DIAGNOSIS — I2510 Atherosclerotic heart disease of native coronary artery without angina pectoris: Secondary | ICD-10-CM | POA: Diagnosis not present

## 2022-10-17 DIAGNOSIS — E875 Hyperkalemia: Principal | ICD-10-CM | POA: Insufficient documentation

## 2022-10-17 DIAGNOSIS — R918 Other nonspecific abnormal finding of lung field: Secondary | ICD-10-CM | POA: Diagnosis not present

## 2022-10-17 DIAGNOSIS — D631 Anemia in chronic kidney disease: Secondary | ICD-10-CM | POA: Diagnosis not present

## 2022-10-17 DIAGNOSIS — R109 Unspecified abdominal pain: Secondary | ICD-10-CM | POA: Diagnosis not present

## 2022-10-17 DIAGNOSIS — I517 Cardiomegaly: Secondary | ICD-10-CM | POA: Diagnosis not present

## 2022-10-17 DIAGNOSIS — I120 Hypertensive chronic kidney disease with stage 5 chronic kidney disease or end stage renal disease: Secondary | ICD-10-CM | POA: Diagnosis not present

## 2022-10-17 DIAGNOSIS — R6889 Other general symptoms and signs: Secondary | ICD-10-CM | POA: Diagnosis not present

## 2022-10-17 DIAGNOSIS — Z743 Need for continuous supervision: Secondary | ICD-10-CM | POA: Diagnosis not present

## 2022-10-17 DIAGNOSIS — K7689 Other specified diseases of liver: Secondary | ICD-10-CM | POA: Diagnosis not present

## 2022-10-17 LAB — CBC WITH DIFFERENTIAL/PLATELET
Abs Immature Granulocytes: 0.02 10*3/uL (ref 0.00–0.07)
Basophils Absolute: 0.1 10*3/uL (ref 0.0–0.1)
Basophils Relative: 1 %
Eosinophils Absolute: 0.6 10*3/uL — ABNORMAL HIGH (ref 0.0–0.5)
Eosinophils Relative: 11 %
HCT: 43.4 % (ref 39.0–52.0)
Hemoglobin: 14.5 g/dL (ref 13.0–17.0)
Immature Granulocytes: 0 %
Lymphocytes Relative: 26 %
Lymphs Abs: 1.4 10*3/uL (ref 0.7–4.0)
MCH: 33.2 pg (ref 26.0–34.0)
MCHC: 33.4 g/dL (ref 30.0–36.0)
MCV: 99.3 fL (ref 80.0–100.0)
Monocytes Absolute: 0.5 10*3/uL (ref 0.1–1.0)
Monocytes Relative: 10 %
Neutro Abs: 2.7 10*3/uL (ref 1.7–7.7)
Neutrophils Relative %: 52 %
Platelets: 140 10*3/uL — ABNORMAL LOW (ref 150–400)
RBC: 4.37 MIL/uL (ref 4.22–5.81)
RDW: 14.7 % (ref 11.5–15.5)
WBC: 5.3 10*3/uL (ref 4.0–10.5)
nRBC: 0 % (ref 0.0–0.2)

## 2022-10-17 LAB — COMPREHENSIVE METABOLIC PANEL
ALT: 15 U/L (ref 0–44)
AST: 10 U/L — ABNORMAL LOW (ref 15–41)
Albumin: 3.7 g/dL (ref 3.5–5.0)
Alkaline Phosphatase: 101 U/L (ref 38–126)
Anion gap: 22 — ABNORMAL HIGH (ref 5–15)
BUN: 92 mg/dL — ABNORMAL HIGH (ref 8–23)
CO2: 16 mmol/L — ABNORMAL LOW (ref 22–32)
Calcium: 9.5 mg/dL (ref 8.9–10.3)
Chloride: 93 mmol/L — ABNORMAL LOW (ref 98–111)
Creatinine, Ser: 15.46 mg/dL — ABNORMAL HIGH (ref 0.61–1.24)
GFR, Estimated: 3 mL/min — ABNORMAL LOW (ref >60)
Glucose, Bld: 93 mg/dL (ref 70–99)
Potassium: >7.5 mmol/L (ref 3.5–5.1)
Sodium: 131 mmol/L — ABNORMAL LOW (ref 135–145)
Total Bilirubin: 1 mg/dL (ref 0.3–1.2)
Total Protein: 8.1 g/dL (ref 6.5–8.1)

## 2022-10-17 LAB — BASIC METABOLIC PANEL
Anion gap: 17 — ABNORMAL HIGH (ref 5–15)
BUN: 50 mg/dL — ABNORMAL HIGH (ref 8–23)
CO2: 25 mmol/L (ref 22–32)
Calcium: 9.5 mg/dL (ref 8.9–10.3)
Chloride: 91 mmol/L — ABNORMAL LOW (ref 98–111)
Creatinine, Ser: 10.47 mg/dL — ABNORMAL HIGH (ref 0.61–1.24)
GFR, Estimated: 5 mL/min — ABNORMAL LOW (ref >60)
Glucose, Bld: 117 mg/dL — ABNORMAL HIGH (ref 70–99)
Potassium: 4.2 mmol/L (ref 3.5–5.1)
Sodium: 133 mmol/L — ABNORMAL LOW (ref 135–145)

## 2022-10-17 LAB — TROPONIN I (HIGH SENSITIVITY)
Troponin I (High Sensitivity): 34 ng/L — ABNORMAL HIGH (ref <18)
Troponin I (High Sensitivity): 29 ng/L — ABNORMAL HIGH (ref <18)

## 2022-10-17 LAB — LIPASE, BLOOD: Lipase: 52 U/L — ABNORMAL HIGH (ref 11–51)

## 2022-10-17 LAB — HIV ANTIBODY (ROUTINE TESTING W REFLEX): HIV Screen 4th Generation wRfx: NONREACTIVE

## 2022-10-17 LAB — CBG MONITORING, ED: Glucose-Capillary: 102 mg/dL — ABNORMAL HIGH (ref 70–99)

## 2022-10-17 MED ORDER — CHLORHEXIDINE GLUCONATE CLOTH 2 % EX PADS: 6.0000 | MEDICATED_PAD | Freq: Every day | CUTANEOUS | Status: DC

## 2022-10-17 MED ORDER — ALBUTEROL SULFATE (2.5 MG/3ML) 0.083% IN NEBU
INHALATION_SOLUTION | RESPIRATORY_TRACT | Status: AC
  Filled 2022-10-17: qty 9

## 2022-10-17 MED ORDER — CALCITRIOL 0.5 MCG PO CAPS
1.2500 ug | ORAL_CAPSULE | ORAL | Status: DC
  Administered 2022-10-17: 1.25 ug via ORAL
  Filled 2022-10-17: qty 1

## 2022-10-17 MED ORDER — IOHEXOL 350 MG/ML SOLN
100.0000 mL | Freq: Once | INTRAVENOUS | Status: AC | PRN
  Administered 2022-10-17: 100 mL via INTRAVENOUS

## 2022-10-17 MED ORDER — SODIUM BICARBONATE 8.4 % IV SOLN
50.0000 meq | Freq: Once | INTRAVENOUS | Status: AC
  Administered 2022-10-17: 50 meq via INTRAVENOUS
  Filled 2022-10-17: qty 50

## 2022-10-17 MED ORDER — FUROSEMIDE 10 MG/ML IJ SOLN
40.0000 mg | Freq: Once | INTRAMUSCULAR | Status: AC
  Administered 2022-10-17: 40 mg via INTRAVENOUS
  Filled 2022-10-17: qty 4

## 2022-10-17 MED ORDER — SODIUM BICARBONATE 8.4 % IV SOLN
INTRAVENOUS | Status: AC
  Filled 2022-10-17: qty 50

## 2022-10-17 MED ORDER — ONDANSETRON 4 MG PO TBDP
4.0000 mg | ORAL_TABLET | Freq: Once | ORAL | Status: AC
  Administered 2022-10-17: 4 mg via ORAL
  Filled 2022-10-17: qty 1

## 2022-10-17 MED ORDER — PANTOPRAZOLE SODIUM 40 MG PO TBEC
40.0000 mg | DELAYED_RELEASE_TABLET | Freq: Every day | ORAL | Status: DC
  Administered 2022-10-18: 40 mg via ORAL
  Filled 2022-10-17: qty 1

## 2022-10-17 MED ORDER — ONDANSETRON HCL 4 MG/2ML IJ SOLN: 4.0000 mg | Freq: Four times a day (QID) | INTRAMUSCULAR | Status: DC | PRN

## 2022-10-17 MED ORDER — FENTANYL CITRATE PF 50 MCG/ML IJ SOSY
50.0000 ug | PREFILLED_SYRINGE | Freq: Once | INTRAMUSCULAR | Status: AC
  Administered 2022-10-17: 50 ug via INTRAVENOUS
  Filled 2022-10-17: qty 1

## 2022-10-17 MED ORDER — SUCROFERRIC OXYHYDROXIDE 500 MG PO CHEW
1500.0000 mg | CHEWABLE_TABLET | Freq: Three times a day (TID) | ORAL | Status: DC
  Administered 2022-10-17 – 2022-10-18 (×2): 1500 mg via ORAL
  Filled 2022-10-17 (×6): qty 3

## 2022-10-17 MED ORDER — ALBUTEROL SULFATE (2.5 MG/3ML) 0.083% IN NEBU
10.0000 mg | INHALATION_SOLUTION | Freq: Once | RESPIRATORY_TRACT | Status: AC
  Administered 2022-10-17: 10 mg via RESPIRATORY_TRACT
  Filled 2022-10-17: qty 12

## 2022-10-17 MED ORDER — CALCIUM GLUCONATE 10 % IV SOLN
1.0000 g | Freq: Once | INTRAVENOUS | Status: AC
  Administered 2022-10-17: 1 g via INTRAVENOUS
  Filled 2022-10-17: qty 10

## 2022-10-17 MED ORDER — POLYETHYLENE GLYCOL 3350 17 G PO PACK: 17.0000 g | PACK | Freq: Every day | ORAL | Status: DC | PRN

## 2022-10-17 MED ORDER — DEXTROSE 50 % IV SOLN
1.0000 | Freq: Once | INTRAVENOUS | Status: AC
  Administered 2022-10-17: 50 mL via INTRAVENOUS
  Filled 2022-10-17: qty 50

## 2022-10-17 MED ORDER — SODIUM ZIRCONIUM CYCLOSILICATE 10 G PO PACK
10.0000 g | PACK | Freq: Three times a day (TID) | ORAL | Status: DC
  Administered 2022-10-17 – 2022-10-18 (×3): 10 g via ORAL
  Filled 2022-10-17 (×3): qty 1

## 2022-10-17 MED ORDER — INSULIN ASPART 100 UNIT/ML IV SOLN
5.0000 [IU] | Freq: Once | INTRAVENOUS | Status: AC
  Administered 2022-10-17: 5 [IU] via INTRAVENOUS

## 2022-10-17 MED ORDER — DOCUSATE SODIUM 100 MG PO CAPS: 100.0000 mg | ORAL_CAPSULE | Freq: Two times a day (BID) | ORAL | Status: DC | PRN

## 2022-10-17 NOTE — H&P (Signed)
Critical care attending attestation note:  Patient seen and examined and relevant ancillary tests reviewed.  I agree with the assessment and plan of care as outlined by S Minor, NP.   Synopsis of assessment and plan:  Subjective:   Patient is a 67 y.o. male presents with 4 days of nausea and vomiting.  The symptoms were likely related to cannabis use as he has had nausea and vomiting from [facility] use in the past.  He missed 2 dialysis sessions. In the ED his nausea is improved, he is fatigued.  Potassium was elevated at 7.5.  He was given [facility] and nephrology was contacted.  Patient Active Problem List   Diagnosis Date Noted   Hematemesis 06/16/2021   GI bleed 06/15/2021   Intractable nausea and vomiting 03/19/2021   Diarrhea 03/19/2021   Gastroenteritis 03/19/2021   Abdominal pain with vomiting 10/20/2019   Hypokalemia 10/20/2019   Normocytic anemia 10/20/2019   Hyperkalemia    Acute pancreatitis 07/26/2019   Uremia 02/24/2018   History of anemia due to CKD 02/24/2018   Marijuana abuse 02/24/2018   ESRD (end stage renal disease) (HCC)    Hypertension    Schizophrenia (HCC)    Metabolic acidosis 01/16/2018   Hypertensive urgency 01/15/2018   Type 2 diabetes mellitus with stage 4 chronic kidney disease (HCC) 01/15/2018   CKD (chronic kidney disease) 02/14/2015   Acute kidney injury superimposed on chronic kidney disease (HCC) 02/14/2015   Hyponatremia 02/14/2015   Back pain 02/14/2015   Neuropathy 02/14/2015   Obesity (BMI 30.0-34.9) 02/14/2015   Abdominal pain 02/14/2015   Pancreatitis    Diabetes mellitus without complication (HCC)    Pancreatitis, acute    Schizo-affective psychosis (HCC) 04/28/2013   Past Medical History:  Diagnosis Date   Cataract    Diabetes mellitus without complication (HCC)    Diabetic retinopathy (HCC)    ESRD (end stage renal disease) (HCC)    GERD (gastroesophageal reflux disease)    PMH   Hypertension    Hypertensive retinopathy    Low  back pain    Lung disease    Metabolic acidosis    Neuromuscular disorder (HCC)    peripheral neuropathy   Pancreatitis    Schizophrenia (HCC)    does not take medications    Past Surgical History:  Procedure Laterality Date   A/V FISTULAGRAM Left 06/03/2018   Procedure: A/V FISTULAGRAM;  Surgeon: Cephus Shelling, MD;  Location: [facility] INVASIVE CV LAB;  Service: Cardiovascular;  Laterality: Left;   AV FISTULA PLACEMENT Left 02/11/2018   Procedure: INSERTION OF ARTERIOVENOUS (AV) FISTULA LEFT  ARM;  Surgeon: Maeola Harman, MD;  Location: [facility] OR;  Service: Vascular;  Laterality: Left;   BASCILIC VEIN TRANSPOSITION Left 04/17/2018   Procedure: BASILIC VEIN TRANSPOSITION SECOND STAGE;  Surgeon: Maeola Harman, MD;  Location[facility] OR;  Service: Vascular;  Laterality: Left;   BIOPSY  10/20/2019   Procedure: BIOPSY;  Surgeon: Vida Rigger, MD;  Location: [facility] ENDOSCOPY;  Service: Endoscopy;;   COLONOSCOPY     DENTAL SURGERY     ESOPHAGOGASTRODUODENOSCOPY N/A 06/16/2021   Procedure: ESOPHAGOGASTRODUODENOSCOPY (EGD);  Surgeon: Charlott Rakes, MD;  Location: [facility] ENDOSCOPY;  Service: Gastroenterology;  Laterality: N/A;   ESOPHAGOGASTRODUODENOSCOPY (EGD) WITH PROPOFOL N/A 10/20/2019   Procedure: ESOPHAGOGASTRODUODENOSCOPY (EGD) WITH PROPOFOL;  Surgeon: Vida Rigger, MD;  Location: [facility] ENDOSCOPY;  Service: Endoscopy;  Laterality: N/A;   INSERTION OF DIALYSIS CATHETER Right 02/25/2018   Procedure: INSERTION OF DIALYSIS CATHETER;  Surgeon: Lemar Livings  Cristal Deer, MD;  Location: [facility] OR;  Service: Vascular;  Laterality: Right;   PERIPHERAL VASCULAR BALLOON ANGIOPLASTY  06/03/2018   Procedure: PERIPHERAL VASCULAR BALLOON ANGIOPLASTY;  Surgeon: Cephus Shelling, MD;  Location: [facility] INVASIVE CV LAB;  Service: Cardiovascular;;  left a/v fistula    Medications Prior to Admission  Medication Sig Dispense Refill Last Dose   acetaminophen (TYLENOL) 650 MG CR tablet Take 1,300 mg by mouth daily  as needed for pain.      atorvastatin (LIPITOR) 20 MG tablet Take 20 mg by mouth daily.      dorzolamide-timolol (COSOPT) 2-0.5 % ophthalmic solution Place 1 drop into both eyes 2 (two) times daily. 10 mL 6    ENTRESTO 24-26 MG Take 1 tablet by mouth 2 (two) times daily.      gabapentin (NEURONTIN) 300 MG capsule Take 300 mg by mouth at bedtime.      loperamide (IMODIUM) 2 MG capsule Take 1 capsule (2 mg total) by mouth 4 (four) times daily as needed for diarrhea or loose stools. 12 capsule 0    methocarbamol (ROBAXIN) 500 MG tablet Take 1 tablet (500 mg total) by mouth every 8 (eight) hours as needed for muscle spasms. 20 tablet 0    metoprolol succinate (TOPROL-XL) 25 MG 24 hr tablet Take 37.5 mg by mouth daily.      ondansetron (ZOFRAN-ODT) 4 MG disintegrating tablet Take 1 tablet (4 mg total) by mouth every 8 (eight) hours as needed for nausea or vomiting. 20 tablet 0    pantoprazole (PROTONIX) 40 MG tablet Take 1 tablet (40 mg total) by mouth 2 (two) times daily before a meal. Twice a day x 4 weeks, then daily 60 tablet 3    sertraline (ZOLOFT) 25 MG tablet Take 25 mg by mouth daily as needed (depression).      sucroferric oxyhydroxide (VELPHORO) 500 MG chewable tablet Chew 1,500 mg by mouth 3 (three) times daily with meals.      Allergies  Allergen Reactions   Gabapentin Nausea Only   Ibuprofen Hives   Penicillins Itching and Other (See Comments)    Has patient had a PCN reaction causing immediate rash, facial/tongue/throat swelling, SOB or lightheadedness with hypotension: No Has patient had a PCN reaction causing severe rash involving mucus membranes or skin necrosis: No Has patient had a PCN reaction that required hospitalization No Has patient had a PCN reaction occurring within the last 10 years: Unknown If all of the above answers are "NO", then may proceed with Cephalosporin use.   Venlafaxine Other (See Comments)    insomnia    Social History   Tobacco Use   Smoking status:  Former   Smokeless tobacco: Never  Substance Use Topics   Alcohol use: No    Family History  Problem Relation Age of Onset   Kidney failure Mother    Diabetes Father    Blindness Brother     Review of Systems Pertinent items are noted in HPI.  Objective:    BP 113/82   Pulse 75   Temp (!) 97.5 F (36.4 C) (Oral)   Resp 14   Ht 5\' 9"  (1.753 m)   Wt 93.4 kg Comment: standing  SpO2 100%   BMI 30.41 kg/m   General Appearance:    Alert, cooperative, no distress, appears stated age.  Somnolent  Head:    Normocephalic, without obvious abnormality, atraumatic  Eyes:    PERRL, conjunctiva/corneas clear, slight left facial droop  Ears:  Normal TM's and external ear canals, both ears  Nose:   Nares normal, septum midline, mucosa normal, no drainage    or sinus tenderness  Throat:   Lips, mucosa, and tongue normal; teeth and gums normal  Neck:   Supple, symmetrical, trachea midline, no adenopathy;       thyroid:  No enlargement/tenderness/nodules; no carotid   bruit or JVD  Back:     Symmetric, no curvature, ROM normal, no CVA tenderness  Lungs:     Clear to auscultation bilaterally, respirations unlabored  Chest wall:    No tenderness or deformity  Heart:    Regular rate and rhythm, S1 and S2 normal, no murmur, rub   or gallop  Abdomen:     Soft, non-tender, bowel sounds active all four quadrants,    no masses, no organomegaly  Genitalia:    Normal male without lesion, discharge or tenderness  Rectal:    Normal tone, normal prostate, no masses or tenderness;   guaiac negative stool  Extremities:   Extremities normal, atraumatic, no cyanosis or edema  Pulses:   2+ and symmetric all extremities  Skin:   Skin color, texture, turgor normal, no rashes or lesions  Lymph nodes:   Cervical, supraclavicular, and axillary nodes normal  Neurologic:   CNII-XII intact. Normal strength, sensation and reflexes      throughout    ECG: normal sinus rhythm, no blocks or conduction defects,  no ischemic changes.  No QRS widening.  No peaked T waves. Data ReviewBMP:  Lab Results  Component Value Date   GLUCOSE 93 10/17/2022   CO2 16 (L) 10/17/2022   BUN 92 (H) 10/17/2022   CREATININE 15.46 (H) 10/17/2022   CALCIUM 9.5 10/17/2022    Assessment:   Principal Problem:   Hyperkalemia End-stage renal disease on hemodialysis Refractory nausea and vomiting due to [facility] misuse  Plan:   -Urgent hemodialysis.  Tentatively admit as observation.  Patient should feel better after HD and potassium should normalize on follow-up. -Depending on how well he tolerates, may be ready for discharge as early as tomorrow.. -If hyperkalemia persists postdialysis, may consider switching admission to ICU at that time.     Lynnell Catalan, MD [facility] ICU Physician [facility]  Pager: [phone] Mobile: [phone] After hours: [phone].  10/17/2022, 1:10 PM

## 2022-10-17 NOTE — Procedures (Signed)
I was present at this dialysis session, have reviewed the session and made  appropriate changes Vinson Moselle MD  [facility] 10/17/2022, 10:40 AM

## 2022-10-17 NOTE — ED Provider Notes (Signed)
[place] EMERGENCY DEPARTMENT AT [facility] Provider Note   CSN: [id number] Arrival date & time: 10/17/22  0540     History  Chief Complaint  Patient presents with   Vomiting   Abdominal Pain    Jonathan Mcneil is a 67 y.o. male.  Patient complains of nausea and vomiting for the past 4 days.  Patient missed dialysis on Tuesday and today.  Patient complains of chest and abdominal pain.   The history is provided by the patient. No language interpreter was used.  Abdominal Pain Pain location:  Generalized Pain quality: aching   Pain radiates to:  Does not radiate Pain severity:  Moderate Timing:  Constant Progression:  Worsening Chronicity:  New Relieved by:  Nothing Worsened by:  Nothing Associated symptoms: nausea and vomiting        Home Medications Prior to Admission medications   Medication Sig Start Date End Date Taking? Authorizing Provider  acetaminophen (TYLENOL) 650 MG CR tablet Take 1,300 mg by mouth daily as needed for pain.    [provider]  atorvastatin (LIPITOR) 20 MG tablet Take 20 mg by mouth daily.    [provider]  dorzolamide-timolol (COSOPT) 2-0.5 % ophthalmic solution Place 1 drop into both eyes 2 (two) times daily. 03/29/22   Rennis Chris, MD  ENTRESTO 24-26 MG Take 1 tablet by mouth 2 (two) times daily. 10/11/21   [provider]  gabapentin (NEURONTIN) 300 MG capsule Take 300 mg by mouth at bedtime. 09/20/20   [provider]  loperamide (IMODIUM) 2 MG capsule Take 1 capsule (2 mg total) by mouth 4 (four) times daily as needed for diarrhea or loose stools. 10/23/21   Long, Arlyss Repress, MD  methocarbamol (ROBAXIN) 500 MG tablet Take 1 tablet (500 mg total) by mouth every 8 (eight) hours as needed for muscle spasms. 08/12/22   Gloris Manchester, MD  metoprolol succinate (TOPROL-XL) 25 MG 24 hr tablet Take 37.5 mg by mouth daily.    [provider]  ondansetron (ZOFRAN-ODT) 4 MG disintegrating tablet  Take 1 tablet (4 mg total) by mouth every 8 (eight) hours as needed for nausea or vomiting. 10/23/21   Long, Arlyss Repress, MD  pantoprazole (PROTONIX) 40 MG tablet Take 1 tablet (40 mg total) by mouth 2 (two) times daily before a meal. Twice a day x 4 weeks, then daily 06/17/21   [name], [name], MD  sertraline (ZOLOFT) 25 MG tablet Take 25 mg by mouth daily as needed (depression). 11/14/20   [provider]  sucroferric oxyhydroxide (VELPHORO) 500 MG chewable tablet Chew 1,500 mg by mouth 3 (three) times daily with meals. 10/12/19   [provider]      Allergies    Gabapentin, Ibuprofen, Penicillins, and Venlafaxine    Review of Systems   Review of Systems  Gastrointestinal:  Positive for abdominal pain, nausea and vomiting.  All other systems reviewed and are negative.   Physical Exam Updated Vital Signs BP (!) 154/75 (BP Location: Right Arm)   Pulse (!) 55   Temp 98 F (36.7 C) (Oral)   Resp 16   Ht 5\' 9"  (1.753 m)   Wt 85.3 kg   SpO2 98%   BMI 27.76 kg/m  Physical Exam Vitals and nursing note reviewed.  Constitutional:      Appearance: He is well-developed.  HENT:     Head: Normocephalic.  Cardiovascular:     Rate and Rhythm: Normal rate.  Pulmonary:  Effort: Pulmonary effort is normal.  Abdominal:     General: Abdomen is flat. Bowel sounds are normal. There is no distension.     Palpations: Abdomen is soft.  Musculoskeletal:        General: Normal range of motion.     Cervical back: Normal range of motion.  Skin:    General: Skin is warm.  Neurological:     Mental Status: He is alert and oriented to person, place, and time.     ED Results / Procedures / Treatments   Labs (all labs ordered are listed, but only abnormal results are displayed) Labs Reviewed  COMPREHENSIVE METABOLIC PANEL - Abnormal; Notable for the following components:      Result Value   Sodium 131 (*)    Potassium >7.5 (*)    Chloride 93 (*)    CO2 16 (*)    BUN 92 (*)     Creatinine, Ser 15.46 (*)    AST 10 (*)    GFR, Estimated 3 (*)    Anion gap 22 (*)    All other components within normal limits  LIPASE, BLOOD - Abnormal; Notable for the following components:   Lipase 52 (*)    All other components within normal limits  CBC WITH DIFFERENTIAL/PLATELET - Abnormal; Notable for the following components:   Platelets 140 (*)    Eosinophils Absolute 0.6 (*)    All other components within normal limits  TROPONIN I (HIGH SENSITIVITY) - Abnormal; Notable for the following components:   Troponin I (High Sensitivity) 34 (*)    All other components within normal limits    EKG EKG Interpretation Date/Time:  Thursday October 17 2022 06:57:23 EDT Ventricular Rate:  60 PR Interval:    QRS Duration:  178 QT Interval:  520 QTC Calculation: 520 R Axis:   -80  Text Interpretation: Junctional rhythm Nonspecific IVCD with LAD Left ventricular hypertrophy Abnormal ECG Confirmed by Zadie Rhine ([id number]) on 10/17/2022 6:59:31 AM  Radiology DG Chest Portable 1 View  Result Date: 10/17/2022 CLINICAL DATA:  Chest pain. EXAM: PORTABLE CHEST 1 VIEW COMPARISON:  PA Lat 08/12/2022 FINDINGS: The heart is moderately enlarged. There is mild central vascular prominence without edema. The mediastinum is normally outlined. There is calcification of the transverse aorta. The lungs are clear. The sulci are sharp. Multiple overlying monitor wires. No new osseous abnormality. IMPRESSION: Cardiomegaly and mild central vascular prominence without edema. No acute lung process. Electronically Signed   By: Almira Bar M.D.   On: 10/17/2022 06:28    Procedures .Critical Care  Performed by: Elson Areas, PA-C Authorized by: Elson Areas, PA-C   Critical care provider statement:    Critical care time (minutes):  75   Critical care start time:  10/17/2022 6:30 AM   Critical care end time:  10/17/2022 9:27 AM   Critical care time was exclusive of:  Separately billable procedures and  treating other patients   Critical care was necessary to treat or prevent imminent or life-threatening deterioration of the following conditions:  Cardiac failure, circulatory failure, CNS failure or compromise, metabolic crisis, renal failure and respiratory failure   Critical care was time spent personally by me on the following activities:  Development of treatment plan with patient or surrogate, discussions with consultants, evaluation of patient's response to treatment, examination of patient, interpretation of cardiac output measurements, ordering and performing treatments and interventions, ordering and review of laboratory studies, ordering and review of radiographic studies, pulse oximetry, re-evaluation  of patient's condition and review of old charts   Care discussed with: admitting provider       Medications Ordered in ED Medications  furosemide (LASIX) injection 40 mg (has no administration in time range)  sodium zirconium cyclosilicate (LOKELMA) packet 10 g (10 g Oral Given 10/17/22 0704)  insulin aspart (novoLOG) injection 5 Units (has no administration in time range)    And  dextrose 50 % solution 50 mL (has no administration in time range)  albuterol (PROVENTIL) (2.5 MG/3ML) 0.083% nebulizer solution (has no administration in time range)  fentaNYL (SUBLIMAZE) injection 50 mcg (50 mcg Intravenous Given 10/17/22 0611)  ondansetron (ZOFRAN-ODT) disintegrating tablet 4 mg (4 mg Oral Given 10/17/22 0709)  calcium gluconate inj 10% (1 g) URGENT USE ONLY! (1 g Intravenous Given 10/17/22 0704)  albuterol (PROVENTIL) (2.5 MG/3ML) 0.083% nebulizer solution 10 mg (10 mg Nebulization Given 10/17/22 0708)    ED Course/ Medical Decision Making/ A&P                             Medical Decision Making Pt complains of nausea and vomiting.  Pt reports abdominal pain   Amount and/or Complexity of Data Reviewed External Data Reviewed: notes.    Details: Dialysis center notes and admission notes  reviewed  Labs: ordered. Decision-making details documented in ED Course.    Details: Labs ordered  Radiology: ordered. ECG/medicine tests: ordered. Discussion of management or test interpretation with external provider(s): Nephrology consulted Dr. Arlean Hopping will see and consult Critical care consulted and will admit.  Risk OTC drugs. Prescription drug management.           Final Clinical Impression(s) / ED Diagnoses Final diagnoses:  Hyperkalemia  Nausea and vomiting, unspecified vomiting type    Rx / DC Orders ED Discharge Orders     None         Elson Areas, [place] 10/17/22 0930    Tegeler, Canary Brim, MD 10/17/22 1538

## 2022-10-17 NOTE — ED Provider Triage Note (Signed)
Emergency Medicine Provider Triage Evaluation Note  Jonathan Mcneil , a 67 y.o. male  was evaluated in triage.  Pt complains of chest and abdominal pain.  Review of Systems  Positive: Chest pain, arm and leg numbness Negative: fever  Physical Exam  BP (!) 154/75 (BP Location: Right Arm)   Pulse (!) 55   Temp 98 F (36.7 C) (Oral)   Resp 16   Ht 1.753 m (5\' 9" )   Wt 85.3 kg   SpO2 98%   BMI 27.76 kg/m  Gen:   Awake, uncomfortable appearing Resp:  Normal effort  MSK:   Moves extremities without difficulty  Other:  Distal pulses equal/intact  Medical Decision Making  Medically screening exam initiated at 6:16 AM.  Appropriate orders placed.  Presten Roylee Wojnarowski was informed that the remainder of the evaluation will be completed by another provider, this initial triage assessment does not replace that evaluation, and the importance of remaining in the ED until their evaluation is complete.  Will proceed with CT dissection study given h/o CP/ABD pain and extremity numbness   Zadie Rhine, MD 10/17/22 [phone]

## 2022-10-17 NOTE — Progress Notes (Signed)
Received patient in bed to unit.  Alert and oriented.  Informed consent signed and in chart.   TX duration:  Patient tolerated well.  Transported back to the room  Alert, without acute distress.  Hand-off given to patient's nurse.   Access used: LAVF Access issues: none  Total UF removed: Medication(s) given: none   10/17/22 1444  Vitals  Temp 97.7 F (36.5 C)  Temp Source Oral  BP 106/67  MAP (mmHg) 79  BP Location Right Arm  BP Method Automatic  Patient Position (if appropriate) Lying  Pulse Rate 77  Pulse Rate Source Monitor  ECG Heart Rate 77  Resp 14  Oxygen Therapy  SpO2 100 %  O2 Device Room Air  Patient Activity (if Appropriate) In bed  Pulse Oximetry Type Continuous  During Treatment Monitoring  HD Safety Checks Performed Yes  Intra-Hemodialysis Comments Tx completed;Tolerated well  Post Treatment  Dialyzer Clearance Heavily streaked  Duration of HD Treatment -hour(s) 3.75 hour(s)  Hemodialysis Intake (mL) 0 mL  Liters Processed 79.5  Fluid Removed (mL) 3500 mL  Tolerated HD Treatment Yes  AVG/AVF Arterial Site Held (minutes) 10 minutes  AVG/AVF Venous Site Held (minutes) 10 minutes  Fistula / Graft Left Upper arm Arteriovenous fistula  Placement Date/Time: 06/16/21 1509   Placed prior to admission: Yes  Orientation: Left  Access Location: Upper arm  Access Type: Arteriovenous fistula  Site Condition No complications  Fistula / Graft Assessment Bruit;Thrill;Present  Status Deaccessed  Needle Size 15  Drainage Description None     Stacie Glaze LPN [facility]

## 2022-10-17 NOTE — ED Notes (Signed)
Pt received for care at 1900.  Bed in lowest position, wheels locked.  Call bell within reach.

## 2022-10-17 NOTE — ED Triage Notes (Signed)
Pt BIB EMS for nausea and vomiting for the last 4 days. Pt missed Tuesday dialysis appointment. CBG 105, BP 178/82, HR 68 PTA. Pt also reporting upper chest and abd pain. States having numbness in arms and legs onset 2 days ago.

## 2022-10-17 NOTE — Consult Note (Signed)
Renal Service Consult Note [facility]  Jonathan Mcneil 10/17/2022 Maree Krabbe, MD Requesting Physician: Dr. Denese Killings  Reason for Consult: esrd pt missed HD w/ hyperkalemia HPI: The patient is a 67 y.o. year-old w/ PMH as below who presented to ED w/ N/V for the last 4 days. Missed HD Tuesday. In ED BP/ HR stable. Labs showed K+ > 7.5.  Pt received temporizing measures in ED for high K+ and pt to be admitted. We are asked to see for dialysis.   Pt seen in HD unit.  No CP or SOB but was having these earlier. Also feels "numb" in arms and legs. No abd pain. Usually goes to his HD sessions.   ROS - denies CP, no joint pain, no HA, no blurry vision, no rash, no diarrhea, no nausea/ vomiting, no dysuria, no difficulty voiding   Past Medical History  Past Medical History:  Diagnosis Date   Cataract    Diabetes mellitus without complication (HCC)    Diabetic retinopathy (HCC)    ESRD (end stage renal disease) (HCC)    GERD (gastroesophageal reflux disease)    PMH   Hypertension    Hypertensive retinopathy    Low back pain    Lung disease    Metabolic acidosis    Neuromuscular disorder (HCC)    peripheral neuropathy   Pancreatitis    Schizophrenia (HCC)    does not take medications   Past Surgical History  Past Surgical History:  Procedure Laterality Date   A/V FISTULAGRAM Left 06/03/2018   Procedure: A/V FISTULAGRAM;  Surgeon: Cephus Shelling, MD;  Location: [facility] INVASIVE CV LAB;  Service: Cardiovascular;  Laterality: Left;   AV FISTULA PLACEMENT Left 02/11/2018   Procedure: INSERTION OF ARTERIOVENOUS (AV) FISTULA LEFT  ARM;  Surgeon: Maeola Harman, MD;  Location: [facility] OR;  Service: Vascular;  Laterality: Left;   BASCILIC VEIN TRANSPOSITION Left 04/17/2018   Procedure: BASILIC VEIN TRANSPOSITION SECOND STAGE;  Surgeon: Maeola Harman, MD;  Location: [facility] OR;  Service: Vascular;  Laterality: Left;   BIOPSY  10/20/2019   Procedure:  BIOPSY;  Surgeon: Vida Rigger, MD;  Location: [facility] ENDOSCOPY;  Service: Endoscopy;;   COLONOSCOPY     DENTAL SURGERY     ESOPHAGOGASTRODUODENOSCOPY N/A 06/16/2021   Procedure: ESOPHAGOGASTRODUODENOSCOPY (EGD);  Surgeon: Charlott Rakes, MD;  Location: [facility] ENDOSCOPY;  Service: Gastroenterology;  Laterality: N/A;   ESOPHAGOGASTRODUODENOSCOPY (EGD) WITH PROPOFOL N/A 10/20/2019   Procedure: ESOPHAGOGASTRODUODENOSCOPY (EGD) WITH PROPOFOL;  Surgeon: Vida Rigger, MD;  Location: [facility] ENDOSCOPY;  Service: Endoscopy;  Laterality: N/A;   INSERTION OF DIALYSIS CATHETER Right 02/25/2018   Procedure: INSERTION OF DIALYSIS CATHETER;  Surgeon: Maeola Harman, MD;  Location: [facility] OR;  Service: Vascular;  Laterality: Right;   PERIPHERAL VASCULAR BALLOON ANGIOPLASTY  06/03/2018   Procedure: PERIPHERAL VASCULAR BALLOON ANGIOPLASTY;  Surgeon: Cephus Shelling, MD;  Location: [facility] INVASIVE CV LAB;  Service: Cardiovascular;;  left a/v fistula   Family History  Family History  Problem Relation Age of Onset   Kidney failure Mother    Diabetes Father    Blindness Brother    Social History  reports that he has quit smoking. He has never used smokeless tobacco. He reports that he does not currently use drugs after having used the following drugs: Cocaine and Marijuana. He reports that he does not drink alcohol. Allergies  Allergies  Allergen Reactions   Gabapentin Nausea Only   Ibuprofen Hives   Penicillins Itching and Other (See  Comments)    Has patient had a PCN reaction causing immediate rash, facial/tongue/throat swelling, SOB or lightheadedness with hypotension: No Has patient had a PCN reaction causing severe rash involving mucus membranes or skin necrosis: No Has patient had a PCN reaction that required hospitalization No Has patient had a PCN reaction occurring within the last 10 years: Unknown If all of the above answers are "NO", then may proceed with Cephalosporin use.   Venlafaxine Other (See  Comments)    insomnia   Home medications Prior to Admission medications   Medication Sig Start Date End Date Taking? Authorizing Provider  acetaminophen (TYLENOL) 650 MG CR tablet Take 1,300 mg by mouth daily as needed for pain.    [provider]  atorvastatin (LIPITOR) 20 MG tablet Take 20 mg by mouth daily.    [provider]  dorzolamide-timolol (COSOPT) 2-0.5 % ophthalmic solution Place 1 drop into both eyes 2 (two) times daily. 03/29/22   Rennis Chris, MD  ENTRESTO 24-26 MG Take 1 tablet by mouth 2 (two) times daily. 10/11/21   [provider]  gabapentin (NEURONTIN) 300 MG capsule Take 300 mg by mouth at bedtime. 09/20/20   [provider]  loperamide (IMODIUM) 2 MG capsule Take 1 capsule (2 mg total) by mouth 4 (four) times daily as needed for diarrhea or loose stools. 10/23/21   Long, Arlyss Repress, MD  methocarbamol (ROBAXIN) 500 MG tablet Take 1 tablet (500 mg total) by mouth every 8 (eight) hours as needed for muscle spasms. 08/12/22   Gloris Manchester, MD  metoprolol succinate (TOPROL-XL) 25 MG 24 hr tablet Take 37.5 mg by mouth daily.    [provider]  ondansetron (ZOFRAN-ODT) 4 MG disintegrating tablet Take 1 tablet (4 mg total) by mouth every 8 (eight) hours as needed for nausea or vomiting. 10/23/21   Long, Arlyss Repress, MD  pantoprazole (PROTONIX) 40 MG tablet Take 1 tablet (40 mg total) by mouth 2 (two) times daily before a meal. Twice a day x 4 weeks, then daily 06/17/21   [name], [name], MD  sertraline (ZOLOFT) 25 MG tablet Take 25 mg by mouth daily as needed (depression). 11/14/20   [provider]  sucroferric oxyhydroxide (VELPHORO) 500 MG chewable tablet Chew 1,500 mg by mouth 3 (three) times daily with meals. 10/12/19   [provider]     Vitals:   10/17/22 0543 10/17/22 0550  BP: (!) 154/75   Pulse: (!) 55   Resp: 16   Temp: 98 F (36.7 C)   TempSrc: Oral   SpO2: 98%   Weight:  85.3 kg  Height:  5\' 9"  (1.753 m)    Exam Gen alert, no distress No rash, cyanosis or gangrene Sclera anicteric, throat clear  No jvd or bruits Chest clear bilat to bases, no rales/ wheezing RRR no MRG Abd soft ntnd no mass or ascites +bs GU nl male MS no joint effusions or deformity Ext no LE or UE edema, no wounds or ulcers Neuro is alert, Ox 3 , nf    LUA AVF+bruit      Home meds include - tylenol, lipitor, cosopt, entresto, neurontin 300 hs, imodium, methocarbamol, metoprolol xl 37.5, zofran, protonix, sertraline, sucroferric oxyhydroxide 1.5 gm ac tid     OP HD: [facility]  4h   450/1.5   89kg   2/2 bath   L AVF   Heparin none - rocaltrol 1.25 mcg po tiw - last OP HD 7/20, post wt 89.2kg  Assessment/ Plan: Hyperkalemia - severe, K+ > 7.5, widened QRS at , HR 50's, BP's wnl. SP temporizing measures in ED. Plan HD asap upstairs. Use low K+ bath.  N/V/ abd pain - x 3-4 days, missed HD due to this on 7/23 ESRD - on HD TTS. Missed HD x 1. HD today.  HTN/ volume - 4-5kg over, UF 3.5 w/ HD today. BP's stable.  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 Cont binder and po vdra. H/o DM2 - not on medication now it appears      Vinson Moselle  MD [facility] 10/17/2022, 8:13 AM  Recent Labs  Lab 10/17/22 0604  HGB 14.5  ALBUMIN 3.7  CALCIUM 9.5  CREATININE 15.46*  K >7.5*   Inpatient medications:  sodium bicarbonate  50 mEq Intravenous Once   sodium zirconium cyclosilicate  10 g Oral TID

## 2022-10-17 NOTE — Consult Note (Signed)
NAME:  Jonathan Mcneil, MRN:  [id number], DOB:  [birth year], LOS: 0 ADMISSION DATE:  10/17/2022, CONSULTATION DATE: 10/17/2022 REFERRING MD: Emergency Department physician, CHIEF COMPLAINT: Nausea and vomiting leading to missed hemodialysis session  History of Present Illness:  67 year old male end-stage renal disease he has missed 2 sessions of hemodialysis secondary to nausea and vomiting.  He denies fevers chills or sweats.  He denies cough.  No mention of blood in his gastric contents.  Currently he is in the emergency department has been treated with temporizing measures for his hyperkalemia which is noted to be greater than 7.5.  Renal services have been contacted he is to undergo urgent hemodialysis and critical care will admit to stepdown unit at this time.  Pertinent  Medical History   Past Medical History:  Diagnosis Date   Cataract    Diabetes mellitus without complication (HCC)    Diabetic retinopathy (HCC)    ESRD (end stage renal disease) (HCC)    GERD (gastroesophageal reflux disease)    PMH   Hypertension    Hypertensive retinopathy    Low back pain    Lung disease    Metabolic acidosis    Neuromuscular disorder (HCC)    peripheral neuropathy   Pancreatitis    Schizophrenia (HCC)    does not take medications     Significant Hospital Events: Including procedures, antibiotic start and stop dates in addition to other pertinent events     Interim History / Subjective:  Missed 2 dialysis sessions  Objective   Blood pressure 132/61, pulse (!) 50, temperature 98 F (36.7 C), temperature source Oral, resp. rate 18, height 5\' 9"  (1.753 m), weight 85.3 kg, SpO2 95%.       No intake or output data in the 24 hours ending 10/17/22 0916 Filed Weights   10/17/22 0550  Weight: 85.3 kg    Examination: General: Well-nourished well-developed male no acute distress HENT: No JVD is appreciated Lungs: Diminished in the bases Cardiovascular: Heart sounds are regular  regular rate rhythm Abdomen: Obese soft nontender Extremities: Left forearm AV fistula positive thrill Neuro: Grossly intact without focal defect GU: Does make small amounts of urine  Resolved Hospital Problem list     Assessment & Plan:  Electrolyte disarray secondary to 2 missed hemodialysis sessions secondary to nausea and vomiting.  Noted to have QRS extension and hyperkalemia greater than 7.5 Urgent dialysis Temporizing measures for insulin, D50, calcium.  Hyperkalemic agent. Admit to stepdown unit for cardiac monitoring Ready for discharge later today or in a.m. depending on how dialysis impacts his electrolytes disarray   Hypertension Hold antihypertensives for now  Nausea vomiting Antiemetic as needed    Best Practice (right click and "Reselect all SmartList Selections" daily)   Diet/type: NPO DVT prophylaxis:  GI prophylaxis: PPI Lines: Dialysis Catheter Foley:  Yes, and it is still needed Code Status:  full code Last date of multidisciplinary goals of care discussion [tbd]  Labs   CBC: Recent Labs  Lab 10/17/22 0604  WBC 5.3  NEUTROABS 2.7  HGB 14.5  HCT 43.4  MCV 99.3  PLT 140*    Basic Metabolic Panel: Recent Labs  Lab 10/17/22 0604  NA 131*  K >7.5*  CL 93*  CO2 16*  GLUCOSE 93  BUN 92*  CREATININE 15.46*  CALCIUM 9.5   GFR: Estimated Creatinine Clearance: 5.1 mL/min (A) (by C-G formula based on SCr of 15.46 mg/dL (H)). Recent Labs  Lab 10/17/22 0604  WBC 5.3  Liver Function Tests: Recent Labs  Lab 10/17/22 0604  AST 10*  ALT 15  ALKPHOS 101  BILITOT 1.0  PROT 8.1  ALBUMIN 3.7   Recent Labs  Lab 10/17/22 0604  LIPASE 52*   No results for input(s): "AMMONIA" in the last 168 hours.  ABG    Component Value Date/Time   HCO3 25.7 06/15/2021 1020   TCO2 27 06/15/2021 1020   O2SAT 98 06/15/2021 1020     Coagulation Profile: No results for input(s): "INR", "PROTIME" in the last 168 hours.  Cardiac Enzymes: No  results for input(s): "CKTOTAL", "CKMB", "CKMBINDEX", "TROPONINI" in the last 168 hours.  HbA1C: Hgb A1c MFr Bld  Date/Time Value Ref Range Status  03/19/2021 04:30 AM 5.3 4.8 - 5.6 % Final    Comment:    (NOTE) Pre diabetes:          5.7%-6.4%  Diabetes:              >6.4%  Glycemic control for   <7.0% adults with diabetes   01/15/2018 02:04 PM 6.6 (H) 4.8 - 5.6 % Final    Comment:    (NOTE) Pre diabetes:          5.7%-6.4% Diabetes:              >6.4% Glycemic control for   <7.0% adults with diabetes     CBG: Recent Labs  Lab 10/17/22 0814  GLUCAP 102*    Review of Systems:   10 point review of system taken, please see HPI for positives and negatives.   Past Medical History:  He,  has a past medical history of Cataract, Diabetes mellitus without complication (HCC), Diabetic retinopathy (HCC), ESRD (end stage renal disease) (HCC), GERD (gastroesophageal reflux disease), Hypertension, Hypertensive retinopathy, Low back pain, Lung disease, Metabolic acidosis, Neuromuscular disorder (HCC), Pancreatitis, and Schizophrenia (HCC).   Surgical History:   Past Surgical History:  Procedure Laterality Date   A/V FISTULAGRAM Left 06/03/2018   Procedure: A/V FISTULAGRAM;  Surgeon: Cephus Shelling, MD;  Location: [facility] INVASIVE CV LAB;  Service: Cardiovascular;  Laterality: Left;   AV FISTULA PLACEMENT Left 02/11/2018   Procedure: INSERTION OF ARTERIOVENOUS (AV) FISTULA LEFT  ARM;  Surgeon: Maeola Harman, MD;  Location: [facility] OR;  Service: Vascular;  Laterality: Left;   BASCILIC VEIN TRANSPOSITION Left 04/17/2018   Procedure: BASILIC VEIN TRANSPOSITION SECOND STAGE;  Surgeon: Maeola Harman, MD;  Location: [facility] OR;  Service: Vascular;  Laterality: Left;   BIOPSY  10/20/2019   Procedure: BIOPSY;  Surgeon: Vida Rigger, MD;  Location: [facility] ENDOSCOPY;  Service: Endoscopy;;   COLONOSCOPY     DENTAL SURGERY     ESOPHAGOGASTRODUODENOSCOPY N/A 06/16/2021   Procedure:  ESOPHAGOGASTRODUODENOSCOPY (EGD);  Surgeon: Charlott Rakes, MD;  Location: [facility] ENDOSCOPY;  Service: Gastroenterology;  Laterality: N/A;   ESOPHAGOGASTRODUODENOSCOPY (EGD) WITH PROPOFOL N/A 10/20/2019   Procedure: ESOPHAGOGASTRODUODENOSCOPY (EGD) WITH PROPOFOL;  Surgeon: Vida Rigger, MD;  Location: [facility] ENDOSCOPY;  Service: Endoscopy;  Laterality: N/A;   INSERTION OF DIALYSIS CATHETER Right 02/25/2018   Procedure: INSERTION OF DIALYSIS CATHETER;  Surgeon: Maeola Harman, MD;  Location: [facility] OR;  Service: Vascular;  Laterality: Right;   PERIPHERAL VASCULAR BALLOON ANGIOPLASTY  06/03/2018   Procedure: PERIPHERAL VASCULAR BALLOON ANGIOPLASTY;  Surgeon: Cephus Shelling, MD;  Location: [facility] INVASIVE CV LAB;  Service: Cardiovascular;;  left a/v fistula     Social History:   reports that he has quit smoking. He has never used smokeless tobacco. He  reports that he does not currently use drugs after having used the following drugs: Cocaine and Marijuana. He reports that he does not drink alcohol.   Family History:  His family history includes Blindness in his brother; Diabetes in his father; Kidney failure in his mother.   Allergies Allergies  Allergen Reactions   Gabapentin Nausea Only   Ibuprofen Hives   Penicillins Itching and Other (See Comments)    Has patient had a PCN reaction causing immediate rash, facial/tongue/throat swelling, SOB or lightheadedness with hypotension: No Has patient had a PCN reaction causing severe rash involving mucus membranes or skin necrosis: No Has patient had a PCN reaction that required hospitalization No Has patient had a PCN reaction occurring within the last 10 years: Unknown If all of the above answers are "NO", then may proceed with Cephalosporin use.   Venlafaxine Other (See Comments)    insomnia     Home Medications  Prior to Admission medications   Medication Sig Start Date End Date Taking? Authorizing Provider  acetaminophen (TYLENOL) 650 MG  CR tablet Take 1,300 mg by mouth daily as needed for pain.    [provider]  atorvastatin (LIPITOR) 20 MG tablet Take 20 mg by mouth daily.    [provider]  dorzolamide-timolol (COSOPT) 2-0.5 % ophthalmic solution Place 1 drop into both eyes 2 (two) times daily. 03/29/22   Rennis Chris, MD  ENTRESTO 24-26 MG Take 1 tablet by mouth 2 (two) times daily. 10/11/21   [provider]  gabapentin (NEURONTIN) 300 MG capsule Take 300 mg by mouth at bedtime. 09/20/20   [provider]  loperamide (IMODIUM) 2 MG capsule Take 1 capsule (2 mg total) by mouth 4 (four) times daily as needed for diarrhea or loose stools. 10/23/21   Long, Arlyss Repress, MD  methocarbamol (ROBAXIN) 500 MG tablet Take 1 tablet (500 mg total) by mouth every 8 (eight) hours as needed for muscle spasms. 08/12/22   Gloris Manchester, MD  metoprolol succinate (TOPROL-XL) 25 MG 24 hr tablet Take 37.5 mg by mouth daily.    [provider]  ondansetron (ZOFRAN-ODT) 4 MG disintegrating tablet Take 1 tablet (4 mg total) by mouth every 8 (eight) hours as needed for nausea or vomiting. 10/23/21   Long, Arlyss Repress, MD  pantoprazole (PROTONIX) 40 MG tablet Take 1 tablet (40 mg total) by mouth 2 (two) times daily before a meal. Twice a day x 4 weeks, then daily 06/17/21   [name], [name], MD  sertraline (ZOLOFT) 25 MG tablet Take 25 mg by mouth daily as needed (depression). 11/14/20   [provider]  sucroferric oxyhydroxide (VELPHORO) 500 MG chewable tablet Chew 1,500 mg by mouth 3 (three) times daily with meals. 10/12/19   [provider]     Critical care time: 34 min    Brett Canales Emerald Gehres ACNP Acute Care Nurse Practitioner [facility] Please consult Amion 10/17/2022, 9:16 AM

## 2022-10-18 MED ORDER — ACETAMINOPHEN 325 MG PO TABS
650.0000 mg | ORAL_TABLET | Freq: Four times a day (QID) | ORAL | Status: DC | PRN
  Administered 2022-10-18: 650 mg via ORAL
  Filled 2022-10-18: qty 2

## 2022-10-18 MED ORDER — ACETAMINOPHEN 500 MG PO TABS
500.0000 mg | ORAL_TABLET | Freq: Once | ORAL | Status: AC
  Administered 2022-10-18: 500 mg via ORAL
  Filled 2022-10-18: qty 1

## 2022-10-18 MED ORDER — CALCITRIOL 0.25 MCG PO CAPS: 1.2500 ug | ORAL_CAPSULE | ORAL | 0 refills | Status: DC

## 2022-10-18 NOTE — Consult Note (Signed)
   NAME:  Jonathan Mcneil, MRN:  [id number], DOB:  09-17-1955, LOS: 1 ADMISSION DATE:  10/17/2022, CONSULTATION DATE: 10/17/2022 REFERRING MD: Emergency Department physician, CHIEF COMPLAINT: Nausea and vomiting leading to missed hemodialysis session  History of Present Illness:  67 year old male end-stage renal disease he has missed 2 sessions of hemodialysis secondary to nausea and vomiting.  He denies fevers chills or sweats.  He denies cough.  No mention of blood in his gastric contents.  Currently he is in the emergency department has been treated with temporizing measures for his hyperkalemia which is noted to be greater than 7.5.  Renal services have been contacted he is to undergo urgent hemodialysis and critical care will admit to stepdown unit at this time.  Pertinent  Medical History   Past Medical History:  Diagnosis Date   Cataract    Diabetes mellitus without complication (HCC)    Diabetic retinopathy (HCC)    ESRD (end stage renal disease) (HCC)    GERD (gastroesophageal reflux disease)    PMH   Hypertension    Hypertensive retinopathy    Low back pain    Lung disease    Metabolic acidosis    Neuromuscular disorder (HCC)    peripheral neuropathy   Pancreatitis    Schizophrenia (HCC)    does not take medications     Significant Hospital Events: Including procedures, antibiotic start and stop dates in addition to other pertinent events   7/25 - Tolerated HD yesterday  Interim History / Subjective:  Feels better. Severe flank pain this am now resolved.   Objective   Blood pressure 126/80, pulse 66, temperature 97.7 F (36.5 C), temperature source Oral, resp. rate 16, height 5\' 9"  (1.753 m), weight 89.4 kg, SpO2 100%.        Intake/Output Summary (Last 24 hours) at 10/18/2022 1051 Last data filed at 10/17/2022 1444 Gross per 24 hour  Intake --  Output 3500 ml  Net -3500 ml   Filed Weights   10/17/22 0550 10/17/22 0948 10/17/22 1506  Weight: 85.3 kg 93.4  kg 89.4 kg    Examination: General: Well-nourished well-developed male no acute distress HENT: No JVD is appreciated Lungs: Diminished in the bases Cardiovascular: Heart sounds are regular regular rate rhythm Abdomen: Obese soft nontender Extremities: Left forearm AV fistula positive thrill, No pain on palpation of left flank.  Neuro: Grossly intact without focal defect GU: Does make small amounts of urine  Ancillary Tests:  K: 4.2  Creatinine 10.47  Assessment & Plan:  Electrolyte disarray secondary to 2 missed hemodialysis sessions secondary to nausea and vomiting.   Hyperkalemia has resolved.  - Discussed with Dr Arlean Hopping, can be discharged home.  - Can resume all usual medications.   Hypertension -Hold antihypertensives for now  Nausea vomiting -Antiemetic as needed -Counseled to stop THC use.  Best Practice (right click and "Reselect all SmartList Selections" daily)   Diet/type: renal diet.  DVT prophylaxis:  GI prophylaxis: PPI Lines: Dialysis Catheter Foley:  Yes, and it is still needed Code Status:  full code Last date of multidisciplinary goals of care discussion [tbd]  Lynnell Catalan, MD [facility] ICU Physician [facility] [place] [facility]  Pager: [phone] Or Epic Secure Chat After hours: [phone].  10/18/2022, 10:56 AM     10/18/2022, 10:51 AM

## 2022-10-18 NOTE — Care Management CC44 (Signed)
Condition Code 44 Documentation Completed  Patient Details  Name: Jonathan Mcneil MRN: [id number] Date of Birth: 1955-08-16   Condition Code 44 given:  Yes Patient signature on Condition Code 44 notice:  Yes Documentation of 2 MD's agreement:  Yes Code 44 added to claim:  Yes    Lavenia Atlas, RN 10/18/2022, 11:38 AM

## 2022-10-18 NOTE — Care Management Obs Status (Signed)
MEDICARE OBSERVATION STATUS NOTIFICATION   Patient Details  Name: Alcide Marcelo MRN: [id number] Date of Birth: September 14, 1955   Medicare Observation Status Notification Given:  Yes    Lavenia Atlas, RN 10/18/2022, 11:38 AM

## 2022-10-18 NOTE — Plan of Care (Signed)
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  Hyperkalemia - 7.5 -> s/p urgent HD  HD ORDER CHANGES: Heparin change: no EDW Change: no Bath Change: no  ANEMIA MANAGEMENT: Aranesp: Given: no  ESA dose for discharge: none IV Iron dose at discharge: none Transfusion: Given: no  BONE/MINERAL MEDICATIONS: Hectorol/Calcitriol change: no Sensipar/Parsabiv change: no  ACCESS INTERVENTION/CHANGE: no  RECENT LABS: Recent Labs  Lab 10/17/22 0604 10/17/22 1550  HGB 14.5  --   NA 131* 133*  K >7.5* 4.2  CALCIUM 9.5 9.5  ALBUMIN 3.7  --     IV ANTIBIOTICS: no Details:  OTHER ANTICOAGULATION: On Coumadin?: no   OTHER/APPTS/LAB ORDERS: - Check weekly K x 2 times - remind on low K diet   D/C Meds to be reconciled by nurse after every discharge.  Completed By:   Reviewed by: MD:______ RN_______

## 2022-10-18 NOTE — ED Notes (Signed)
Pt endorsing headache.  This RN to attempt to notify admitting coverage as pt does not have PRN orders for same.

## 2022-10-18 NOTE — ED Notes (Signed)
Patient was given 3 packs of Graham Crackers.

## 2022-10-18 NOTE — Discharge Summary (Signed)
Physician Discharge Summary  Patient ID: Jonathan Mcneil MRN: [id number] DOB/AGE: [birth year] 67 y.o.  Admit date: 10/17/2022 Discharge date: 10/18/2022  Admission Diagnoses:  Discharge Diagnoses:  Principal Problem:   Hyperkalemia   Discharged Condition: good  Hospital Course: Came in with malaise from protracted nausea and vomiting likely due to cannabis use.  Missed 2 sessions of dialysis.  Was found to have a potassium of 7.5 with ECG changes initially.  Given Lokelma with improvement in his ECG.  Brought for urgent dialysis.  By the next morning patient is in sinus rhythm with a narrow QRS with no peaked T waves.  He had an episode of left-sided flank pain which spontaneously resolved and is likely musculoskeletal in origin.  He is feeling better and is agreeable to go home.  Spoke to nephrology who plan to dialyze him as usual as an outpatient tomorrow.  Consults:  Nephrology  Significant Diagnostic Studies: labs: Potassium 7.5 initially down to 4.2 postdialysis.  Treatments: dialysis: Hemodialysis  Discharge Exam: Blood pressure 126/80, pulse 66, temperature 97.7 F (36.5 C), temperature source Oral, resp. rate 16, height 5\' 9"  (1.753 m), weight 89.4 kg, SpO2 100%. 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  He is in no distress.  Chest clear.  JVD not elevated heart sounds are normal.  No edema.  Abdomen soft and nontender.  Functioning left arm fistula.  No pain on palpation to left flank.  Neurologically intact.  Oriented and responding appropriately to questions.  Disposition: Discharge disposition: 01-Home or Self Care     He was advised to stop using THC to avoid further episodes of severe nausea and vomiting.   Allergies as of 10/18/2022       Reactions   Gabapentin Nausea Only   Ibuprofen Hives   Penicillins Itching, Other (See Comments)   Has patient had a PCN reaction causing immediate rash, facial/tongue/throat swelling, SOB or lightheadedness with  hypotension: No Has patient had a PCN reaction causing severe rash involving mucus membranes or skin necrosis: No Has patient had a PCN reaction that required hospitalization No Has patient had a PCN reaction occurring within the last 10 years: Unknown If all of the above answers are "NO", then may proceed with Cephalosporin use.   Venlafaxine Other (See Comments)   insomnia        Medication List     TAKE these medications    atorvastatin 20 MG tablet Commonly known as: LIPITOR Take 20 mg by mouth daily.   calcitRIOL 0.25 MCG capsule Commonly known as: ROCALTROL Take 5 capsules (1.25 mcg total) by mouth every Tuesday, Thursday, and Saturday at 6 PM. Start taking on: October 19, 2022   dorzolamide-timolol 2-0.5 % ophthalmic solution Commonly known as: COSOPT Place 1 drop into both eyes 2 (two) times daily.   Entresto 24-26 MG Generic drug: sacubitril-valsartan Take 1 tablet by mouth 2 (two) times daily.   gabapentin 300 MG capsule Commonly known as: NEURONTIN Take 300 mg by mouth at bedtime.   loperamide 2 MG capsule Commonly known as: IMODIUM Take 1 capsule (2 mg total) by mouth 4 (four) times daily as needed for diarrhea or loose stools.   methocarbamol 500 MG tablet Commonly known as: ROBAXIN Take 1 tablet (500 mg total) by mouth every 8 (eight) hours as needed for muscle spasms.   ondansetron 4 MG disintegrating tablet Commonly known as: ZOFRAN-ODT Take 1 tablet (4 mg total) by mouth every 8 (eight) hours as needed for nausea or vomiting.  pantoprazole 40 MG tablet Commonly known as: Protonix Take 1 tablet (40 mg total) by mouth 2 (two) times daily before a meal. Twice a day x 4 weeks, then daily   sertraline 25 MG tablet Commonly known as: ZOLOFT Take 25 mg by mouth 3 (three) times a week. M W F         SignedDaleen Bo Maegan Buller 10/18/2022, 11:02 AM

## 2022-10-18 NOTE — Progress Notes (Signed)
D/C order noted. Contacted [facility] to advise clinic of pt's d/c today and that pt should resume care tomorrow.   Tracy Mounce Renal Navigator [phone] 

## 2022-10-19 DIAGNOSIS — E1129 Type 2 diabetes mellitus with other diabetic kidney complication: Secondary | ICD-10-CM | POA: Diagnosis not present

## 2022-10-19 DIAGNOSIS — N186 End stage renal disease: Secondary | ICD-10-CM | POA: Diagnosis not present

## 2022-10-19 DIAGNOSIS — D631 Anemia in chronic kidney disease: Secondary | ICD-10-CM | POA: Diagnosis not present

## 2022-10-19 DIAGNOSIS — R197 Diarrhea, unspecified: Secondary | ICD-10-CM | POA: Diagnosis not present

## 2022-10-19 DIAGNOSIS — Z992 Dependence on renal dialysis: Secondary | ICD-10-CM | POA: Diagnosis not present

## 2022-10-19 DIAGNOSIS — N2581 Secondary hyperparathyroidism of renal origin: Secondary | ICD-10-CM | POA: Diagnosis not present

## 2022-10-22 DIAGNOSIS — R0602 Shortness of breath: Secondary | ICD-10-CM | POA: Diagnosis not present

## 2022-10-22 DIAGNOSIS — N186 End stage renal disease: Secondary | ICD-10-CM | POA: Diagnosis not present

## 2022-10-22 DIAGNOSIS — R197 Diarrhea, unspecified: Secondary | ICD-10-CM | POA: Diagnosis not present

## 2022-10-22 DIAGNOSIS — E782 Mixed hyperlipidemia: Secondary | ICD-10-CM | POA: Diagnosis not present

## 2022-10-22 DIAGNOSIS — I340 Nonrheumatic mitral (valve) insufficiency: Secondary | ICD-10-CM | POA: Diagnosis not present

## 2022-10-22 DIAGNOSIS — I5022 Chronic systolic (congestive) heart failure: Secondary | ICD-10-CM | POA: Diagnosis not present

## 2022-10-22 DIAGNOSIS — E1129 Type 2 diabetes mellitus with other diabetic kidney complication: Secondary | ICD-10-CM | POA: Diagnosis not present

## 2022-10-22 DIAGNOSIS — E103513 Type 1 diabetes mellitus with proliferative diabetic retinopathy with macular edema, bilateral: Secondary | ICD-10-CM | POA: Diagnosis not present

## 2022-10-22 DIAGNOSIS — R9431 Abnormal electrocardiogram [ECG] [EKG]: Secondary | ICD-10-CM | POA: Diagnosis not present

## 2022-10-22 DIAGNOSIS — I700 Atherosclerosis of aorta: Secondary | ICD-10-CM | POA: Diagnosis not present

## 2022-10-22 DIAGNOSIS — N2581 Secondary hyperparathyroidism of renal origin: Secondary | ICD-10-CM | POA: Diagnosis not present

## 2022-10-22 DIAGNOSIS — D631 Anemia in chronic kidney disease: Secondary | ICD-10-CM | POA: Diagnosis not present

## 2022-10-22 DIAGNOSIS — Z992 Dependence on renal dialysis: Secondary | ICD-10-CM | POA: Diagnosis not present

## 2022-10-22 DIAGNOSIS — N185 Chronic kidney disease, stage 5: Secondary | ICD-10-CM | POA: Diagnosis not present

## 2022-10-23 DIAGNOSIS — E1122 Type 2 diabetes mellitus with diabetic chronic kidney disease: Secondary | ICD-10-CM | POA: Diagnosis not present

## 2022-10-23 DIAGNOSIS — N186 End stage renal disease: Secondary | ICD-10-CM | POA: Diagnosis not present

## 2022-10-23 DIAGNOSIS — Z992 Dependence on renal dialysis: Secondary | ICD-10-CM | POA: Diagnosis not present

## 2022-10-24 DIAGNOSIS — E1129 Type 2 diabetes mellitus with other diabetic kidney complication: Secondary | ICD-10-CM | POA: Diagnosis not present

## 2022-10-24 DIAGNOSIS — R52 Pain, unspecified: Secondary | ICD-10-CM | POA: Diagnosis not present

## 2022-10-24 DIAGNOSIS — D631 Anemia in chronic kidney disease: Secondary | ICD-10-CM | POA: Diagnosis not present

## 2022-10-24 DIAGNOSIS — N2581 Secondary hyperparathyroidism of renal origin: Secondary | ICD-10-CM | POA: Diagnosis not present

## 2022-10-24 DIAGNOSIS — Z992 Dependence on renal dialysis: Secondary | ICD-10-CM | POA: Diagnosis not present

## 2022-10-24 DIAGNOSIS — N186 End stage renal disease: Secondary | ICD-10-CM | POA: Diagnosis not present

## 2022-10-26 DIAGNOSIS — R52 Pain, unspecified: Secondary | ICD-10-CM | POA: Diagnosis not present

## 2022-10-26 DIAGNOSIS — N186 End stage renal disease: Secondary | ICD-10-CM | POA: Diagnosis not present

## 2022-10-26 DIAGNOSIS — Z992 Dependence on renal dialysis: Secondary | ICD-10-CM | POA: Diagnosis not present

## 2022-10-26 DIAGNOSIS — D631 Anemia in chronic kidney disease: Secondary | ICD-10-CM | POA: Diagnosis not present

## 2022-10-26 DIAGNOSIS — E1129 Type 2 diabetes mellitus with other diabetic kidney complication: Secondary | ICD-10-CM | POA: Diagnosis not present

## 2022-10-26 DIAGNOSIS — N2581 Secondary hyperparathyroidism of renal origin: Secondary | ICD-10-CM | POA: Diagnosis not present

## 2022-10-28 DIAGNOSIS — E1122 Type 2 diabetes mellitus with diabetic chronic kidney disease: Secondary | ICD-10-CM | POA: Diagnosis not present

## 2022-10-28 DIAGNOSIS — E782 Mixed hyperlipidemia: Secondary | ICD-10-CM | POA: Diagnosis not present

## 2022-10-28 DIAGNOSIS — R748 Abnormal levels of other serum enzymes: Secondary | ICD-10-CM | POA: Diagnosis not present

## 2022-10-28 DIAGNOSIS — N186 End stage renal disease: Secondary | ICD-10-CM | POA: Diagnosis not present

## 2022-10-28 DIAGNOSIS — I129 Hypertensive chronic kidney disease with stage 1 through stage 4 chronic kidney disease, or unspecified chronic kidney disease: Secondary | ICD-10-CM | POA: Diagnosis not present

## 2022-10-29 DIAGNOSIS — N186 End stage renal disease: Secondary | ICD-10-CM | POA: Diagnosis not present

## 2022-10-29 DIAGNOSIS — E1129 Type 2 diabetes mellitus with other diabetic kidney complication: Secondary | ICD-10-CM | POA: Diagnosis not present

## 2022-10-29 DIAGNOSIS — D631 Anemia in chronic kidney disease: Secondary | ICD-10-CM | POA: Diagnosis not present

## 2022-10-29 DIAGNOSIS — N2581 Secondary hyperparathyroidism of renal origin: Secondary | ICD-10-CM | POA: Diagnosis not present

## 2022-10-29 DIAGNOSIS — Z992 Dependence on renal dialysis: Secondary | ICD-10-CM | POA: Diagnosis not present

## 2022-10-29 DIAGNOSIS — R52 Pain, unspecified: Secondary | ICD-10-CM | POA: Diagnosis not present

## 2022-10-31 DIAGNOSIS — Z992 Dependence on renal dialysis: Secondary | ICD-10-CM | POA: Diagnosis not present

## 2022-10-31 DIAGNOSIS — E1129 Type 2 diabetes mellitus with other diabetic kidney complication: Secondary | ICD-10-CM | POA: Diagnosis not present

## 2022-10-31 DIAGNOSIS — N186 End stage renal disease: Secondary | ICD-10-CM | POA: Diagnosis not present

## 2022-10-31 DIAGNOSIS — D631 Anemia in chronic kidney disease: Secondary | ICD-10-CM | POA: Diagnosis not present

## 2022-10-31 DIAGNOSIS — R52 Pain, unspecified: Secondary | ICD-10-CM | POA: Diagnosis not present

## 2022-10-31 DIAGNOSIS — N2581 Secondary hyperparathyroidism of renal origin: Secondary | ICD-10-CM | POA: Diagnosis not present

## 2022-11-02 DIAGNOSIS — N2581 Secondary hyperparathyroidism of renal origin: Secondary | ICD-10-CM | POA: Diagnosis not present

## 2022-11-02 DIAGNOSIS — N186 End stage renal disease: Secondary | ICD-10-CM | POA: Diagnosis not present

## 2022-11-02 DIAGNOSIS — E1129 Type 2 diabetes mellitus with other diabetic kidney complication: Secondary | ICD-10-CM | POA: Diagnosis not present

## 2022-11-02 DIAGNOSIS — D631 Anemia in chronic kidney disease: Secondary | ICD-10-CM | POA: Diagnosis not present

## 2022-11-02 DIAGNOSIS — Z992 Dependence on renal dialysis: Secondary | ICD-10-CM | POA: Diagnosis not present

## 2022-11-02 DIAGNOSIS — R52 Pain, unspecified: Secondary | ICD-10-CM | POA: Diagnosis not present

## 2022-11-04 DIAGNOSIS — I129 Hypertensive chronic kidney disease with stage 1 through stage 4 chronic kidney disease, or unspecified chronic kidney disease: Secondary | ICD-10-CM | POA: Diagnosis not present

## 2022-11-04 DIAGNOSIS — N185 Chronic kidney disease, stage 5: Secondary | ICD-10-CM | POA: Diagnosis not present

## 2022-11-04 DIAGNOSIS — E782 Mixed hyperlipidemia: Secondary | ICD-10-CM | POA: Diagnosis not present

## 2022-11-04 DIAGNOSIS — Z0000 Encounter for general adult medical examination without abnormal findings: Secondary | ICD-10-CM | POA: Diagnosis not present

## 2022-11-04 DIAGNOSIS — N186 End stage renal disease: Secondary | ICD-10-CM | POA: Diagnosis not present

## 2022-11-04 DIAGNOSIS — M150 Primary generalized (osteo)arthritis: Secondary | ICD-10-CM | POA: Diagnosis not present

## 2022-11-04 DIAGNOSIS — I700 Atherosclerosis of aorta: Secondary | ICD-10-CM | POA: Diagnosis not present

## 2022-11-04 DIAGNOSIS — Z992 Dependence on renal dialysis: Secondary | ICD-10-CM | POA: Diagnosis not present

## 2022-11-04 DIAGNOSIS — E103513 Type 1 diabetes mellitus with proliferative diabetic retinopathy with macular edema, bilateral: Secondary | ICD-10-CM | POA: Diagnosis not present

## 2022-11-04 DIAGNOSIS — I5022 Chronic systolic (congestive) heart failure: Secondary | ICD-10-CM | POA: Diagnosis not present

## 2022-11-04 DIAGNOSIS — I340 Nonrheumatic mitral (valve) insufficiency: Secondary | ICD-10-CM | POA: Diagnosis not present

## 2022-11-05 DIAGNOSIS — R52 Pain, unspecified: Secondary | ICD-10-CM | POA: Diagnosis not present

## 2022-11-05 DIAGNOSIS — E1129 Type 2 diabetes mellitus with other diabetic kidney complication: Secondary | ICD-10-CM | POA: Diagnosis not present

## 2022-11-05 DIAGNOSIS — H6120 Impacted cerumen, unspecified ear: Secondary | ICD-10-CM | POA: Diagnosis not present

## 2022-11-05 DIAGNOSIS — N2581 Secondary hyperparathyroidism of renal origin: Secondary | ICD-10-CM | POA: Diagnosis not present

## 2022-11-05 DIAGNOSIS — N186 End stage renal disease: Secondary | ICD-10-CM | POA: Diagnosis not present

## 2022-11-05 DIAGNOSIS — Z992 Dependence on renal dialysis: Secondary | ICD-10-CM | POA: Diagnosis not present

## 2022-11-05 DIAGNOSIS — D631 Anemia in chronic kidney disease: Secondary | ICD-10-CM | POA: Diagnosis not present

## 2022-11-07 DIAGNOSIS — N2581 Secondary hyperparathyroidism of renal origin: Secondary | ICD-10-CM | POA: Diagnosis not present

## 2022-11-07 DIAGNOSIS — E1129 Type 2 diabetes mellitus with other diabetic kidney complication: Secondary | ICD-10-CM | POA: Diagnosis not present

## 2022-11-07 DIAGNOSIS — Z992 Dependence on renal dialysis: Secondary | ICD-10-CM | POA: Diagnosis not present

## 2022-11-07 DIAGNOSIS — N186 End stage renal disease: Secondary | ICD-10-CM | POA: Diagnosis not present

## 2022-11-07 DIAGNOSIS — R52 Pain, unspecified: Secondary | ICD-10-CM | POA: Diagnosis not present

## 2022-11-07 DIAGNOSIS — D631 Anemia in chronic kidney disease: Secondary | ICD-10-CM | POA: Diagnosis not present

## 2022-11-09 DIAGNOSIS — Z992 Dependence on renal dialysis: Secondary | ICD-10-CM | POA: Diagnosis not present

## 2022-11-09 DIAGNOSIS — N186 End stage renal disease: Secondary | ICD-10-CM | POA: Diagnosis not present

## 2022-11-09 DIAGNOSIS — D631 Anemia in chronic kidney disease: Secondary | ICD-10-CM | POA: Diagnosis not present

## 2022-11-09 DIAGNOSIS — R52 Pain, unspecified: Secondary | ICD-10-CM | POA: Diagnosis not present

## 2022-11-09 DIAGNOSIS — N2581 Secondary hyperparathyroidism of renal origin: Secondary | ICD-10-CM | POA: Diagnosis not present

## 2022-11-09 DIAGNOSIS — E1129 Type 2 diabetes mellitus with other diabetic kidney complication: Secondary | ICD-10-CM | POA: Diagnosis not present

## 2022-11-12 DIAGNOSIS — Z992 Dependence on renal dialysis: Secondary | ICD-10-CM | POA: Diagnosis not present

## 2022-11-12 DIAGNOSIS — E1129 Type 2 diabetes mellitus with other diabetic kidney complication: Secondary | ICD-10-CM | POA: Diagnosis not present

## 2022-11-12 DIAGNOSIS — R52 Pain, unspecified: Secondary | ICD-10-CM | POA: Diagnosis not present

## 2022-11-12 DIAGNOSIS — D631 Anemia in chronic kidney disease: Secondary | ICD-10-CM | POA: Diagnosis not present

## 2022-11-12 DIAGNOSIS — N2581 Secondary hyperparathyroidism of renal origin: Secondary | ICD-10-CM | POA: Diagnosis not present

## 2022-11-12 DIAGNOSIS — N186 End stage renal disease: Secondary | ICD-10-CM | POA: Diagnosis not present

## 2022-11-14 DIAGNOSIS — Z992 Dependence on renal dialysis: Secondary | ICD-10-CM | POA: Diagnosis not present

## 2022-11-14 DIAGNOSIS — N2581 Secondary hyperparathyroidism of renal origin: Secondary | ICD-10-CM | POA: Diagnosis not present

## 2022-11-14 DIAGNOSIS — R52 Pain, unspecified: Secondary | ICD-10-CM | POA: Diagnosis not present

## 2022-11-14 DIAGNOSIS — E1129 Type 2 diabetes mellitus with other diabetic kidney complication: Secondary | ICD-10-CM | POA: Diagnosis not present

## 2022-11-14 DIAGNOSIS — N186 End stage renal disease: Secondary | ICD-10-CM | POA: Diagnosis not present

## 2022-11-14 DIAGNOSIS — D631 Anemia in chronic kidney disease: Secondary | ICD-10-CM | POA: Diagnosis not present

## 2022-11-16 DIAGNOSIS — N186 End stage renal disease: Secondary | ICD-10-CM | POA: Diagnosis not present

## 2022-11-16 DIAGNOSIS — N2581 Secondary hyperparathyroidism of renal origin: Secondary | ICD-10-CM | POA: Diagnosis not present

## 2022-11-16 DIAGNOSIS — E1129 Type 2 diabetes mellitus with other diabetic kidney complication: Secondary | ICD-10-CM | POA: Diagnosis not present

## 2022-11-16 DIAGNOSIS — D631 Anemia in chronic kidney disease: Secondary | ICD-10-CM | POA: Diagnosis not present

## 2022-11-16 DIAGNOSIS — R52 Pain, unspecified: Secondary | ICD-10-CM | POA: Diagnosis not present

## 2022-11-16 DIAGNOSIS — Z992 Dependence on renal dialysis: Secondary | ICD-10-CM | POA: Diagnosis not present

## 2022-11-17 ENCOUNTER — Other Ambulatory Visit (INDEPENDENT_AMBULATORY_CARE_PROVIDER_SITE_OTHER): Payer: Self-pay | Admitting: Ophthalmology

## 2022-11-18 ENCOUNTER — Encounter (INDEPENDENT_AMBULATORY_CARE_PROVIDER_SITE_OTHER): Payer: Medicare Other | Admitting: Ophthalmology

## 2022-11-18 DIAGNOSIS — H25813 Combined forms of age-related cataract, bilateral: Secondary | ICD-10-CM

## 2022-11-18 DIAGNOSIS — I10 Essential (primary) hypertension: Secondary | ICD-10-CM

## 2022-11-18 DIAGNOSIS — E113513 Type 2 diabetes mellitus with proliferative diabetic retinopathy with macular edema, bilateral: Secondary | ICD-10-CM

## 2022-11-18 DIAGNOSIS — H43812 Vitreous degeneration, left eye: Secondary | ICD-10-CM

## 2022-11-18 DIAGNOSIS — H4311 Vitreous hemorrhage, right eye: Secondary | ICD-10-CM

## 2022-11-18 DIAGNOSIS — H4312 Vitreous hemorrhage, left eye: Secondary | ICD-10-CM

## 2022-11-18 DIAGNOSIS — H40053 Ocular hypertension, bilateral: Secondary | ICD-10-CM

## 2022-11-18 DIAGNOSIS — H35033 Hypertensive retinopathy, bilateral: Secondary | ICD-10-CM

## 2022-11-18 DIAGNOSIS — H35372 Puckering of macula, left eye: Secondary | ICD-10-CM

## 2022-11-19 DIAGNOSIS — N186 End stage renal disease: Secondary | ICD-10-CM | POA: Diagnosis not present

## 2022-11-19 DIAGNOSIS — E1129 Type 2 diabetes mellitus with other diabetic kidney complication: Secondary | ICD-10-CM | POA: Diagnosis not present

## 2022-11-19 DIAGNOSIS — R52 Pain, unspecified: Secondary | ICD-10-CM | POA: Diagnosis not present

## 2022-11-19 DIAGNOSIS — D631 Anemia in chronic kidney disease: Secondary | ICD-10-CM | POA: Diagnosis not present

## 2022-11-19 DIAGNOSIS — N2581 Secondary hyperparathyroidism of renal origin: Secondary | ICD-10-CM | POA: Diagnosis not present

## 2022-11-19 DIAGNOSIS — Z992 Dependence on renal dialysis: Secondary | ICD-10-CM | POA: Diagnosis not present

## 2022-11-19 NOTE — Progress Notes (Signed)
[facility] - Clinic Note  11/20/2022     CHIEF COMPLAINT Patient presents for Retina Follow Up  HISTORY OF PRESENT ILLNESS: Jonathan Mcneil is a 67 y.o. male who presents to the clinic today for:   HPI     Retina Follow Up   Patient presents with  Diabetic Retinopathy.  In both eyes.  This started 4 months ago.  Duration of 4 months.  Since onset it is stable.  I, the attending physician,  performed the HPI with the patient and updated documentation appropriately.        Comments   4 month retina follow up PDR OU and IVA OD pt is reporting no vision changes noticed he has had some flashes and some floaters last A1C 5.4 he is not checking his blood sugar       Last edited by Rennis Chris, MD on 11/20/2022  1:02 PM.    Pt is seeing floaters in both eyes  Referring physician: Georgianne Fick, MD [address] [place],  [place] [address]  HISTORICAL INFORMATION:   Selected notes from the MEDICAL RECORD NUMBER Referred by Dr. Aletta Edouard for heme OD   CURRENT MEDICATIONS: Current Outpatient Medications (Ophthalmic Drugs)  Medication Sig   dorzolamide-timolol (COSOPT) 2-0.5 % ophthalmic solution Place 1 drop into both eyes 2 (two) times daily.   No current facility-administered medications for this visit. (Ophthalmic Drugs)   Current Outpatient Medications (Other)  Medication Sig   atorvastatin (LIPITOR) 20 MG tablet Take 20 mg by mouth daily.   calcitRIOL (ROCALTROL) 0.25 MCG capsule Take 5 capsules (1.25 mcg total) by mouth every Tuesday, Thursday, and Saturday at 6 PM.   ENTRESTO 24-26 MG Take 1 tablet by mouth 2 (two) times daily. (Patient not taking: Reported on 10/17/2022)   gabapentin (NEURONTIN) 300 MG capsule Take 300 mg by mouth at bedtime.   loperamide (IMODIUM) 2 MG capsule Take 1 capsule (2 mg total) by mouth 4 (four) times daily as needed for diarrhea or loose stools. (Patient not taking: Reported on 10/17/2022)    methocarbamol (ROBAXIN) 500 MG tablet Take 1 tablet (500 mg total) by mouth every 8 (eight) hours as needed for muscle spasms.   ondansetron (ZOFRAN-ODT) 4 MG disintegrating tablet Take 1 tablet (4 mg total) by mouth every 8 (eight) hours as needed for nausea or vomiting. (Patient not taking: Reported on 10/17/2022)   pantoprazole (PROTONIX) 40 MG tablet Take 1 tablet (40 mg total) by mouth 2 (two) times daily before a meal. Twice a day x 4 weeks, then daily (Patient not taking: Reported on 10/17/2022)   sertraline (ZOLOFT) 25 MG tablet Take 25 mg by mouth 3 (three) times a week. M W F   No current facility-administered medications for this visit. (Other)   REVIEW OF SYSTEMS: ROS   Positive for: Gastrointestinal, Neurological, Genitourinary, Endocrine, Eyes Negative for: Constitutional, Skin, Musculoskeletal, HENT, Cardiovascular, Respiratory, Psychiatric, Allergic/Imm, Heme/Lymph Last edited by Etheleen Mayhew, COT on 11/20/2022  7:45 AM.     ALLERGIES Allergies  Allergen Reactions   Gabapentin Nausea Only   Ibuprofen Hives   Penicillins Itching and Other (See Comments)    Has patient had a PCN reaction causing immediate rash, facial/tongue/throat swelling, SOB or lightheadedness with hypotension: No Has patient had a PCN reaction causing severe rash involving mucus membranes or skin necrosis: No Has patient had a PCN reaction that required hospitalization No Has patient had a PCN reaction occurring within the last  10 years: Unknown If all of the above answers are "NO", then may proceed with Cephalosporin use.   Venlafaxine Other (See Comments)    insomnia   PAST MEDICAL HISTORY Past Medical History:  Diagnosis Date   Cataract    Diabetes mellitus without complication (HCC)    Diabetic retinopathy (HCC)    ESRD (end stage renal disease) (HCC)    GERD (gastroesophageal reflux disease)    PMH   Hypertension    Hypertensive retinopathy    Low back pain    Lung disease     Metabolic acidosis    Neuromuscular disorder (HCC)    peripheral neuropathy   Pancreatitis    Schizophrenia (HCC)    does not take medications   Past Surgical History:  Procedure Laterality Date   A/V FISTULAGRAM Left 06/03/2018   Procedure: A/V FISTULAGRAM;  Surgeon: Cephus Shelling, MD;  Location: [facility];  Service: Cardiovascular;  Laterality: Left;   AV FISTULA PLACEMENT Left 02/11/2018   Procedure: INSERTION OF ARTERIOVENOUS (AV) FISTULA LEFT  ARM;  Surgeon: Maeola Harman, MD;  Location: [facility] OR;  Service: Vascular;  Laterality: Left;   BASCILIC VEIN TRANSPOSITION Left 04/17/2018   Procedure: BASILIC VEIN TRANSPOSITION SECOND STAGE;  Surgeon: Maeola Harman, MD;  Location[facility] OR;  Service: Vascular;  Laterality: Left;   BIOPSY  10/20/2019   Procedure: BIOPSY;  Surgeon: Vida Rigger, MD;  Location: [facility] ENDOSCOPY;  Service: Endoscopy;;   COLONOSCOPY     DENTAL SURGERY     ESOPHAGOGASTRODUODENOSCOPY N/A 06/16/2021   Procedure: ESOPHAGOGASTRODUODENOSCOPY (EGD);  Surgeon: Charlott Rakes, MD;  Location: [facility] ENDOSCOPY;  Service: Gastroenterology;  Laterality: N/A;   ESOPHAGOGASTRODUODENOSCOPY (EGD) WITH PROPOFOL N/A 10/20/2019   Procedure: ESOPHAGOGASTRODUODENOSCOPY (EGD) WITH PROPOFOL;  Surgeon: Vida Rigger, MD;  Location: [facility] ENDOSCOPY;  Service: Endoscopy;  Laterality: N/A;   INSERTION OF DIALYSIS CATHETER Right 02/25/2018   Procedure: INSERTION OF DIALYSIS CATHETER;  Surgeon: Maeola Harman, MD;  Location: [facility] OR;  Service: Vascular;  Laterality: Right;   PERIPHERAL VASCULAR BALLOON ANGIOPLASTY  06/03/2018   Procedure: PERIPHERAL VASCULAR BALLOON ANGIOPLASTY;  Surgeon: Cephus Shelling, MD;  Location: [facility]VAS[facility] LAB;  Service: Cardiovascular;;  left a/v fistula   FAMILY HISTORY Family History  Problem Relation Age of Onset   Kidney failure Mother    Diabetes Father    Blindness Brother    SOCIAL HISTORY Social History   Tobacco  Use   Smoking status: Former   Smokeless tobacco: Never  Advertising account planner   Vaping status: Never Used  Substance Use Topics   Alcohol use: No   Drug use: Not Currently    Types: Cocaine, Marijuana    Comment: smoked marijuana a few days ago; he denies using cocaine       OPHTHALMIC EXAM:  Base Eye Exam     Visual Acuity (Snellen - Linear)       Right Left   Dist [place] 20/25 20/20 -3   Dist ph [place] NI          Tonometry (Tonopen, 7:49 AM)       Right Left   Pressure 18 19         Pupils       Pupils Dark Light Shape React APD   Right PERRL 2 2 Round Minimal None   Left PERRL 2 2 Round Minimal None         Visual Fields       Left Right  Full Full         Extraocular Movement       Right Left    Full, Ortho Full, Ortho         Neuro/Psych     Oriented x3: Yes   Mood/Affect: Normal         Dilation     Both eyes: 2.5% Phenylephrine @ 7:49 AM           Slit Lamp and Fundus Exam     Slit Lamp Exam       Right Left   Lids/Lashes Dermatochalasis - upper lid Dermatochalasis - upper lid   Conjunctiva/Sclera Melanosis Melanosis, temporal pinguecula   Cornea arcus, tear film debris arcus   Anterior Chamber Deep and quiet Deep and quiet   Iris Round and dilated, No NVI Round and poorly dilated, No NVI   Lens 2-3+ Nuclear sclerosis, 3+ Cortical cataract 2-3+ Nuclear sclerosis, 3+ Cortical cataract   Anterior Vitreous Vitreous syneresis, old white blood clots settled inferiorly Vitreous syneresis, blood stained vitreous condensations - improved, mild blood clots settling inferiorly and improving, Posterior vitreous detachment         Fundus Exam       Right Left   Disc Mild Pallor, Sharp rim, focal PPP/PPA Mild Pallor, Sharp rim   C/D Ratio 0.5 0.5   Macula Flat, Good foveal reflex, RPE mottling and clumping, trace cystic changes temporal macula -- slightly increased, trace MA, +ERM Flat, good foveal reflex, ERM, RPE mottling and clumping,  scattered Microaneurysms/DBH greatest temporal macula, trace cystic changes temporal macula -- slightly improved   Vessels attenuated, mild tortuosity attenuated, Tortuous   Periphery Attached, 360 IRH/DBH - improved, pre-retinal heme settled inferiorly, 360 PRP with room for fill in Attached, scattered IRH/DBH greatest nasally, good 360 PRP changes with room for fill in, No RT/RD           IMAGING AND PROCEDURES  Imaging and Procedures for 11/20/2022  OCT, Retina - OU - Both Eyes       Right Eye Quality was good. Central Foveal Thickness: 279. Progression has worsened. Findings include normal foveal contour, no SRF, intraretinal hyper-reflective material, epiretinal membrane, intraretinal fluid (Mild persistent vitreous opacities, non-central cystic changes -- slightly increased, mild ERM).   Left Eye Quality was good. Central Foveal Thickness: 319. Progression has improved. Findings include no SRF, abnormal foveal contour, intraretinal hyper-reflective material, epiretinal membrane, intraretinal fluid (persistent focal pockets of SRF IN periphery - caught on widefield -- improved, ERM with blunted foveal contour, trace cystic changes temporal macula -- improved).   Notes *Images captured and stored on drive  Diagnosis / Impression:  OD: Mild persistent vitreous opacities, non-central cystic changes -- slightly increased, mild ERM OS: persistent focal pockets of SRF IN periphery - caught on widefield -- improved, ERM with blunted foveal contour, trace cystic changes temporal macula -- improved  Clinical management:  See below  Abbreviations: NFP - Normal foveal profile. CME - cystoid macular edema. PED - pigment epithelial detachment. IRF - intraretinal fluid. SRF - subretinal fluid. EZ - ellipsoid zone. ERM - epiretinal membrane. ORA - outer retinal atrophy. ORT - outer retinal tubulation. SRHM - subretinal hyper-reflective material. IRHM - intraretinal hyper-reflective material       Intravitreal Injection, Pharmacologic Agent - OD - Right Eye       Time Out 11/20/2022. 9:10 AM. Confirmed correct patient, procedure, site, and patient consented.   Anesthesia Topical anesthesia was used. Anesthetic medications included Lidocaine 2%, Proparacaine  0.5%.   Procedure Preparation included 5% betadine to ocular surface, eyelid speculum. A supplied (32g) needle was used.   Injection: 1.25 mg Bevacizumab 1.25mg /0.53ml   Route: Intravitreal, Site: Right Eye   NDC: [id number], Lot: [id number] , Expiration date: 12/07/2022   Post-op Post injection exam found visual acuity of at least counting fingers. The patient tolerated the procedure well. There were no complications. The patient received written and verbal post procedure care education. Post injection medications were not given.            ASSESSMENT/PLAN:   ICD-10-CM   1. 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 Vitreous hemorrhage of left eye (HCC)  H43.12     3. Vitreous hemorrhage, right eye (HCC)  H43.11     4. Posterior vitreous detachment of left eye  H43.812     5. Essential hypertension  I10     6. Hypertensive retinopathy of both eyes  H35.033     7. Epiretinal membrane (ERM) of left eye  H35.372     8. Combined forms of age-related cataract of both eyes  H25.813     9. Bilateral ocular hypertension  H40.053      1-3. Proliferative diabetic retinopathy w/ recurrent VH OU (OD > OS)  - A1c: 5.9 on 03.13.24 - s/p IVA OD #1 (12.08.21), #2 (01.11.22), #3 (02.09.22), #4 (11.04.22), #5 (12.02.22), #6 (12.30.22), #7 (01.27.23), #8 (01.05.24), #9 (02.02.24) - s/p IVA OS #1 (12.10.21), #2 (01.11.22), #3 (02.09.22), #4 (09.09.22), #5 (10.07.22), #6 (11.04.22), #7 (12.02.22), #8  (12.30.22) - FA (12.08.21) shows late-leaking MA, vascular nonperfusion, +NV OU (nasal midzone) - s/p PRP OD (01.26.22) - s/p PRP OS (03.09.22) - repeat FA 6.6.22 shows interval improvement in NVE/leakage OU s/p PRP OU-- just minimal leakage remains - BCVA 20/25 OD, 20/20 OS - stable OU - exam shows mild increase in floaters / VH OD; OS with blood stained vit condensations improving from hemorrhagic PVD, and blood clots settled inferiorly  - OCT shows OD: Mild persistent vitreous opacities, non-central cystic changes -- slightly increased, mild ERM at 6+ mos since last injection; OS: persistent focal pockets of SRF IN periphery - caught on widefield -- improved, ERM with blunted foveal contour, trace cystic changes temporal macula -- improved  - recommend IVA OD #10 today, 08.28.24  - pt wishes to proceed with injection  - RBA of procedure discussed, questions answered - IVA informed consent obtained and signed, 01.05.24 (OD) - see procedure note  - f/u 6 weeks, DFE, OCT, FA (transit OS), possible injections possible fill in PRP  4. Hemorrhagic PVD OS  - Onset mid-August 2022 w/ new onset floaters, no photopsias  - s/p IVA OS #4 (09.09.22), #5 (10.07.22), #6 (11.04.22), #7 (12.02.22), #8 (12.30.22) - today, VA stably improved to 20/20 OS from 20/40 and 20/80 prior - pt states they have been holding heparin at dialysis - exam shows interval improvement in vitreous heme -- clearing and settling inferiorly - pt reports previously receiving heparin during dialysis (T, Th, Sat) -- may have contributed to interval worsening   - Discussed findings and prognosis  - No RT or RD on 360 peripheral exam  - Reviewed s/s of RT/RD  - Strict return precautions for any such RT/RD signs/symptoms  -  VH precautions reviewed -- minimize activities, keep head elevated, hold heparin if able, avoid ASA/NSAIDS  5,6. Hypertensive retinopathy OU - discussed importance of tight BP control - monitor  7.  Epiretinal membrane, left eye  - mild ERM - BCVA 20/20 - asymptomatic, no metamorphopsia - no indication for surgery at this time - monitor for now - f/u 3 mos -- DFE/OCT  8. Mixed Cataract OU - The symptoms of cataract, surgical options, and treatments and risks were discussed with patient - discussed diagnosis and progression - approaching visual significance - will refer to Dr. Dione Booze for cataract eval / consult  9. Ocular Hypertension OU  - IOP today: 18,19  - cont cosopt bid OU    Ophthalmic Meds Ordered this visit:  Meds ordered this encounter  Medications   Bevacizumab (AVASTIN) SOLN 1.25 mg     Return in about 6 weeks (around 01/01/2023) for f/u PDR OU, DFE, OCT.  There are no Patient Instructions on file for this visit.  This document serves as a record of services personally performed by Karie Chimera, MD, PhD. It was created on their behalf by De Blanch, an ophthalmic technician. The creation of this record is the provider's dictation and/or activities during the visit.    Electronically signed by: De Blanch, OA, 11/20/22  1:03 PM  This document serves as a record of services personally performed by Karie Chimera, MD, PhD. It was created on their behalf by Glee Arvin. Manson Passey, OA an ophthalmic technician. The creation of this record is the provider's dictation and/or activities during the visit.    Electronically signed by: Glee Arvin. Manson Passey, OA 11/20/22 1:03 PM  Karie Chimera, M.D., Ph.D. Diseases & Surgery of the Retina and Vitreous [facility] 11/20/2022   I have reviewed the above documentation for accuracy and completeness, and I agree with the above. Karie Chimera, M.D., Ph.D. 11/20/22 1:06 PM   .Abbreviations: M myopia (nearsighted); A astigmatism; H hyperopia (farsighted); P presbyopia; Mrx spectacle prescription;  CTL contact lenses; OD right eye; OS left eye; OU both eyes  XT exotropia; ET esotropia; PEK punctate  epithelial keratitis; PEE punctate epithelial erosions; DES dry eye syndrome; MGD meibomian gland dysfunction; ATs artificial tears; PFAT's preservative free artificial tears; NSC nuclear sclerotic cataract; PSC posterior subcapsular cataract; ERM epi-retinal membrane; PVD posterior vitreous detachment; RD retinal detachment; DM diabetes mellitus; DR diabetic retinopathy; NPDR non-proliferative diabetic retinopathy; PDR proliferative diabetic retinopathy; CSME clinically significant macular edema; DME diabetic macular edema; dbh dot blot hemorrhages; CWS cotton wool spot; POAG primary open angle glaucoma; C/D cup-to-disc ratio; HVF humphrey visual field; GVF goldmann visual field; OCT optical coherence tomography; IOP intraocular pressure; BRVO Branch retinal vein occlusion; CRVO central retinal vein occlusion; CRAO central retinal artery occlusion; BRAO branch retinal artery occlusion; RT retinal tear; SB scleral buckle; PPV pars plana vitrectomy; VH Vitreous hemorrhage; PRP panretinal laser photocoagulation; IVK intravitreal kenalog; VMT vitreomacular traction; MH Macular hole;  NVD neovascularization of the disc; NVE neovascularization elsewhere; AREDS age related eye disease study; ARMD age related macular degeneration; POAG primary open angle glaucoma; EBMD epithelial/anterior basement membrane dystrophy; ACIOL anterior chamber intraocular lens; IOL intraocular lens; PCIOL posterior chamber intraocular lens; Phaco/IOL phacoemulsification with intraocular lens placement; PRK photorefractive keratectomy; LASIK laser assisted in situ keratomileusis; HTN hypertension; DM diabetes mellitus; COPD chronic obstructive pulmonary disease

## 2022-11-20 ENCOUNTER — Ambulatory Visit (INDEPENDENT_AMBULATORY_CARE_PROVIDER_SITE_OTHER): Payer: Medicare Other | Admitting: Ophthalmology

## 2022-11-20 ENCOUNTER — Encounter (INDEPENDENT_AMBULATORY_CARE_PROVIDER_SITE_OTHER): Payer: Self-pay | Admitting: Ophthalmology

## 2022-11-20 DIAGNOSIS — H35372 Puckering of macula, left eye: Secondary | ICD-10-CM | POA: Diagnosis not present

## 2022-11-20 DIAGNOSIS — E113513 Type 2 diabetes mellitus with proliferative diabetic retinopathy with macular edema, bilateral: Secondary | ICD-10-CM

## 2022-11-20 DIAGNOSIS — H4312 Vitreous hemorrhage, left eye: Secondary | ICD-10-CM

## 2022-11-20 DIAGNOSIS — H43812 Vitreous degeneration, left eye: Secondary | ICD-10-CM | POA: Diagnosis not present

## 2022-11-20 DIAGNOSIS — H40053 Ocular hypertension, bilateral: Secondary | ICD-10-CM | POA: Diagnosis not present

## 2022-11-20 DIAGNOSIS — I10 Essential (primary) hypertension: Secondary | ICD-10-CM | POA: Diagnosis not present

## 2022-11-20 DIAGNOSIS — H25813 Combined forms of age-related cataract, bilateral: Secondary | ICD-10-CM | POA: Diagnosis not present

## 2022-11-20 DIAGNOSIS — H35033 Hypertensive retinopathy, bilateral: Secondary | ICD-10-CM

## 2022-11-20 DIAGNOSIS — H4313 Vitreous hemorrhage, bilateral: Secondary | ICD-10-CM | POA: Diagnosis not present

## 2022-11-20 DIAGNOSIS — H4311 Vitreous hemorrhage, right eye: Secondary | ICD-10-CM

## 2022-11-20 MED ORDER — BEVACIZUMAB CHEMO INJECTION 1.25MG/0.05ML SYRINGE FOR KALEIDOSCOPE
1.2500 mg | INTRAVITREAL | Status: AC | PRN
Start: 2022-11-20 — End: 2022-11-20
  Administered 2022-11-20: 1.25 mg via INTRAVITREAL

## 2022-11-21 DIAGNOSIS — N2581 Secondary hyperparathyroidism of renal origin: Secondary | ICD-10-CM | POA: Diagnosis not present

## 2022-11-21 DIAGNOSIS — Z992 Dependence on renal dialysis: Secondary | ICD-10-CM | POA: Diagnosis not present

## 2022-11-21 DIAGNOSIS — D631 Anemia in chronic kidney disease: Secondary | ICD-10-CM | POA: Diagnosis not present

## 2022-11-21 DIAGNOSIS — N186 End stage renal disease: Secondary | ICD-10-CM | POA: Diagnosis not present

## 2022-11-21 DIAGNOSIS — R52 Pain, unspecified: Secondary | ICD-10-CM | POA: Diagnosis not present

## 2022-11-21 DIAGNOSIS — E1129 Type 2 diabetes mellitus with other diabetic kidney complication: Secondary | ICD-10-CM | POA: Diagnosis not present

## 2022-11-22 ENCOUNTER — Encounter: Payer: Medicare Other | Admitting: Cardiology

## 2022-11-22 DIAGNOSIS — R0609 Other forms of dyspnea: Secondary | ICD-10-CM | POA: Insufficient documentation

## 2022-11-22 NOTE — Progress Notes (Unsigned)
Patient referred by Georgianne Fick, MD for ***  Subjective:   Jonathan Mcneil, male    DOB: [birth year], 67 y.o.   MRN: [id number]  *** No chief complaint on file.   *** HPI  67 y.o. African-American male with ESRD on hemodialysis  *** Past Medical History:  Diagnosis Date   Cataract    Diabetes mellitus without complication (HCC)    Diabetic retinopathy (HCC)    ESRD (end stage renal disease) (HCC)    GERD (gastroesophageal reflux disease)    PMH   Hypertension    Hypertensive retinopathy    Low back pain    Lung disease    Metabolic acidosis    Neuromuscular disorder (HCC)    peripheral neuropathy   Pancreatitis    Schizophrenia (HCC)    does not take medications    *** Past Surgical History:  Procedure Laterality Date   A/V FISTULAGRAM Left 06/03/2018   Procedure: A/V FISTULAGRAM;  Surgeon: Cephus Shelling, MD;  Location: [facility]VAS[facility] LAB;  Service: Cardiovascular;  Laterality: Left;   AV FISTULA PLACEMENT Left 02/11/2018   Procedure: INSERTION OF ARTERIOVENOUS (AV) FISTULA LEFT  ARM;  Surgeon: Maeola Harman, MD;  Location: [facility] OR;  Service: Vascular;  Laterality: Left;   BASCILIC VEIN TRANSPOSITION Left 04/17/2018   Procedure: BASILIC VEIN TRANSPOSITION SECOND STAGE;  Surgeon: Maeola Harman, MD;  Location: [facility] OR;  Service: Vascular;  Laterality: Left;   BIOPSY  10/20/2019   Procedure: BIOPSY;  Surgeon: Vida Rigger, MD;  Location: [facility] ENDOSCOPY;  Service: Endoscopy;;   COLONOSCOPY     DENTAL SURGERY     ESOPHAGOGASTRODUODENOSCOPY N/A 06/16/2021   Procedure: ESOPHAGOGASTRODUODENOSCOPY (EGD);  Surgeon: Charlott Rakes, MD;  Location: [facility] ENDOSCOPY;  Service: Gastroenterology;  Laterality: N/A;   ESOPHAGOGASTRODUODENOSCOPY (EGD) WITH PROPOFOL N/A 10/20/2019   Procedure: ESOPHAGOGASTRODUODENOSCOPY (EGD) WITH PROPOFOL;  Surgeon: Vida Rigger, MD;  Location: [facility] ENDOSCOPY;  Service: Endoscopy;  Laterality: N/A;   INSERTION OF  DIALYSIS CATHETER Right 02/25/2018   Procedure: INSERTION OF DIALYSIS CATHETER;  Surgeon: Maeola Harman, MD;  Location: [facility] OR;  Service: Vascular;  Laterality: Right;   PERIPHERAL VASCULAR BALLOON ANGIOPLASTY  06/03/2018   Procedure: PERIPHERAL VASCULAR BALLOON ANGIOPLASTY;  Surgeon: Cephus Shelling, MD;  Location: [facility] INVASIVE [facility] LAB;  Service: Cardiovascular;;  left a/v fistula    *** Social History   Tobacco Use  Smoking Status Former  Smokeless Tobacco Never    Social History   Substance and Sexual Activity  Alcohol Use No    *** Family History  Problem Relation Age of Onset   Kidney failure Mother    Diabetes Father    Blindness Brother     ***  Current Outpatient Medications:    atorvastatin (LIPITOR) 20 MG tablet, Take 20 mg by mouth daily., Disp: , Rfl:    calcitRIOL (ROCALTROL) 0.25 MCG capsule, Take 5 capsules (1.25 mcg total) by mouth every Tuesday, Thursday, and Saturday at 6 PM., Disp: 30 capsule, Rfl: 0   dorzolamide-timolol (COSOPT) 2-0.5 % ophthalmic solution, Place 1 drop into both eyes 2 (two) times daily., Disp: 10 mL, Rfl: 6   ENTRESTO 24-26 MG, Take 1 tablet by mouth 2 (two) times daily. (Patient not taking: Reported on 10/17/2022), Disp: , Rfl:    gabapentin (NEURONTIN) 300 MG capsule, Take 300 mg by mouth at bedtime., Disp: , Rfl:    loperamide (IMODIUM) 2 MG capsule, Take 1 capsule (2 mg total) by mouth 4 (four) times  daily as needed for diarrhea or loose stools. (Patient not taking: Reported on 10/17/2022), Disp: 12 capsule, Rfl: 0   methocarbamol (ROBAXIN) 500 MG tablet, Take 1 tablet (500 mg total) by mouth every 8 (eight) hours as needed for muscle spasms., Disp: 20 tablet, Rfl: 0   ondansetron (ZOFRAN-ODT) 4 MG disintegrating tablet, Take 1 tablet (4 mg total) by mouth every 8 (eight) hours as needed for nausea or vomiting. (Patient not taking: Reported on 10/17/2022), Disp: 20 tablet, Rfl: 0   pantoprazole (PROTONIX) 40 MG tablet, Take 1  tablet (40 mg total) by mouth 2 (two) times daily before a meal. Twice a day x 4 weeks, then daily (Patient not taking: Reported on 10/17/2022), Disp: 60 tablet, Rfl: 3   sertraline (ZOLOFT) 25 MG tablet, Take 25 mg by mouth 3 (three) times a week. M W F, Disp: , Rfl:    Cardiovascular and other pertinent studies:  Reviewed external labs and tests, independently interpreted  *** EKG ***/***/202***: ***  CTA chest 10/17/2022: 1. Normal caliber aorta with no evidence of acute aortic syndrome. 2. Mild diffuse ground-glass opacities, likely due to pulmonary edema. 3. Bladder wall thickening, likely due to chronic bladder outlet obstruction in the setting of prostatomegaly. Correlate with urinalysis to exclude cystitis. 4. Mildly nodular liver contour, suggestive of cirrhosis. 5. Aortic Atherosclerosis (ICD10-I70.0).      *** Recent labs: 10/17/2022: Glucose 117, BUN/Cr 50/10.4. EGFR 5. Na/K 133/4.2.  H/H 14/43. MCV 99. Platelets 140 ***HbA1C ***% Chol ***, TG ***, HDL ***, LDL *** ***TSH ***normal   *** ROS      *** There were no vitals filed for this visit.   There is no height or weight on file to calculate BMI. There were no vitals filed for this visit.  *** Objective:   Physical Exam    ***     Visit diagnoses: No diagnosis found.   No orders of the defined types were placed in this encounter.    Medication changes this visit: There are no discontinued medications.  No orders of the defined types were placed in this encounter.    Assessment & Recommendations:   ***  ***  Thank you for referring the patient to [place]. Please feel free to contact with any questions.   Elder Negus, MD Pager: [phone] Office: [phone]

## 2022-11-23 DIAGNOSIS — E1129 Type 2 diabetes mellitus with other diabetic kidney complication: Secondary | ICD-10-CM | POA: Diagnosis not present

## 2022-11-23 DIAGNOSIS — N2581 Secondary hyperparathyroidism of renal origin: Secondary | ICD-10-CM | POA: Diagnosis not present

## 2022-11-23 DIAGNOSIS — E1122 Type 2 diabetes mellitus with diabetic chronic kidney disease: Secondary | ICD-10-CM | POA: Diagnosis not present

## 2022-11-23 DIAGNOSIS — D631 Anemia in chronic kidney disease: Secondary | ICD-10-CM | POA: Diagnosis not present

## 2022-11-23 DIAGNOSIS — N186 End stage renal disease: Secondary | ICD-10-CM | POA: Diagnosis not present

## 2022-11-23 DIAGNOSIS — Z992 Dependence on renal dialysis: Secondary | ICD-10-CM | POA: Diagnosis not present

## 2022-11-23 DIAGNOSIS — R52 Pain, unspecified: Secondary | ICD-10-CM | POA: Diagnosis not present

## 2022-11-26 DIAGNOSIS — E1129 Type 2 diabetes mellitus with other diabetic kidney complication: Secondary | ICD-10-CM | POA: Diagnosis not present

## 2022-11-26 DIAGNOSIS — D631 Anemia in chronic kidney disease: Secondary | ICD-10-CM | POA: Diagnosis not present

## 2022-11-26 DIAGNOSIS — N186 End stage renal disease: Secondary | ICD-10-CM | POA: Diagnosis not present

## 2022-11-26 DIAGNOSIS — L299 Pruritus, unspecified: Secondary | ICD-10-CM | POA: Diagnosis not present

## 2022-11-26 DIAGNOSIS — R52 Pain, unspecified: Secondary | ICD-10-CM | POA: Diagnosis not present

## 2022-11-26 DIAGNOSIS — R197 Diarrhea, unspecified: Secondary | ICD-10-CM | POA: Diagnosis not present

## 2022-11-26 DIAGNOSIS — Z992 Dependence on renal dialysis: Secondary | ICD-10-CM | POA: Diagnosis not present

## 2022-11-26 DIAGNOSIS — N2581 Secondary hyperparathyroidism of renal origin: Secondary | ICD-10-CM | POA: Diagnosis not present

## 2022-11-28 ENCOUNTER — Ambulatory Visit: Payer: Medicare Other | Admitting: Cardiology

## 2022-11-28 ENCOUNTER — Encounter: Payer: Self-pay | Admitting: Cardiology

## 2022-11-28 VITALS — BP 93/59 | HR 85 | Resp 16 | Ht 69.0 in | Wt 202.2 lb

## 2022-11-28 DIAGNOSIS — N186 End stage renal disease: Secondary | ICD-10-CM | POA: Diagnosis not present

## 2022-11-28 DIAGNOSIS — L299 Pruritus, unspecified: Secondary | ICD-10-CM | POA: Diagnosis not present

## 2022-11-28 DIAGNOSIS — I5020 Unspecified systolic (congestive) heart failure: Secondary | ICD-10-CM | POA: Diagnosis not present

## 2022-11-28 DIAGNOSIS — I428 Other cardiomyopathies: Secondary | ICD-10-CM | POA: Diagnosis not present

## 2022-11-28 DIAGNOSIS — N2581 Secondary hyperparathyroidism of renal origin: Secondary | ICD-10-CM | POA: Diagnosis not present

## 2022-11-28 DIAGNOSIS — R52 Pain, unspecified: Secondary | ICD-10-CM | POA: Diagnosis not present

## 2022-11-28 DIAGNOSIS — R0609 Other forms of dyspnea: Secondary | ICD-10-CM | POA: Diagnosis not present

## 2022-11-28 DIAGNOSIS — R197 Diarrhea, unspecified: Secondary | ICD-10-CM | POA: Diagnosis not present

## 2022-11-28 DIAGNOSIS — E1129 Type 2 diabetes mellitus with other diabetic kidney complication: Secondary | ICD-10-CM | POA: Diagnosis not present

## 2022-11-28 DIAGNOSIS — D631 Anemia in chronic kidney disease: Secondary | ICD-10-CM | POA: Diagnosis not present

## 2022-11-28 DIAGNOSIS — Z992 Dependence on renal dialysis: Secondary | ICD-10-CM | POA: Diagnosis not present

## 2022-11-28 NOTE — Progress Notes (Signed)
This encounter was created in error - please disregard.

## 2022-11-28 NOTE — Progress Notes (Signed)
Patient referred by Georgianne Fick, MD for shortness of breath  Subjective:   Jonathan Mcneil, male    DOB: 1955/05/06, 67 y.o.   MRN: [id number]   Chief Complaint  Patient presents with   Exertional dyspnea   New Patient (Initial Visit)    Referred by Georgianne Fick, MD     HPI  67 y.o. African-American male with HFrEF, ESRD on hemodialysis  Patient has had exertional dyspnea for quite some time now.  He was initially diagnosed with nonischemic cardiomyopathy at [facility] in 05/2021, which precluded him from being a candidate for kidney transplant.  Subsequent echocardiogram in 05/2022 still shows severe biventricular failure.  CT scan also suggested finding of cirrhosis.  He has had episodes of hypotension with SBP in 80s, occasionally symptomatic with lightheadedness, particularly during dialysis sessions.  He gets short of breath with doing activities such as mowing his grass.  He denies orthopnea, PND, leg edema, chest pain.   Past Medical History:  Diagnosis Date   Cataract    Diabetes mellitus without complication (HCC)    Diabetic retinopathy (HCC)    ESRD (end stage renal disease) (HCC)    GERD (gastroesophageal reflux disease)    PMH   Hypertension    Hypertensive retinopathy    Low back pain    Lung disease    Metabolic acidosis    Neuromuscular disorder (HCC)    peripheral neuropathy   Pancreatitis    Schizophrenia (HCC)    does not take medications     Past Surgical History:  Procedure Laterality Date   A/V FISTULAGRAM Left 06/03/2018   Procedure: A/V FISTULAGRAM;  Surgeon: Cephus Shelling, MD;  Location: [facility] INVASIVE CV LAB;  Service: Cardiovascular;  Laterality: Left;   AV FISTULA PLACEMENT Left 02/11/2018   Procedure: INSERTION OF ARTERIOVENOUS (AV) FISTULA LEFT  ARM;  Surgeon: Maeola Harman, MD;  Location: [facility] OR;  Service: Vascular;  Laterality: Left;   BASCILIC VEIN TRANSPOSITION Left 04/17/2018   Procedure: BASILIC VEIN  TRANSPOSITION SECOND STAGE;  Surgeon: Maeola Harman, MD;  Location: [facility] OR;  Service: Vascular;  Laterality: Left;   BIOPSY  10/20/2019   Procedure: BIOPSY;  Surgeon: Vida Rigger, MD;  Location: [facility] ENDOSCOPY;  Service: Endoscopy;;   COLONOSCOPY     DENTAL SURGERY     ESOPHAGOGASTRODUODENOSCOPY N/A 06/16/2021   Procedure: ESOPHAGOGASTRODUODENOSCOPY (EGD);  Surgeon: Charlott Rakes, MD;  Location: [facility] ENDOSCOPY;  Service: Gastroenterology;  Laterality: N/A;   ESOPHAGOGASTRODUODENOSCOPY (EGD) WITH PROPOFOL N/A 10/20/2019   Procedure: ESOPHAGOGASTRODUODENOSCOPY (EGD) WITH PROPOFOL;  Surgeon: Vida Rigger, MD;  Location: [facility] ENDOSCOPY;  Service: Endoscopy;  Laterality: N/A;   INSERTION OF DIALYSIS CATHETER Right 02/25/2018   Procedure: INSERTION OF DIALYSIS CATHETER;  Surgeon: Maeola Harman, MD;  Location: [facility] OR;  Service: Vascular;  Laterality: Right;   PERIPHERAL VASCULAR BALLOON ANGIOPLASTY  06/03/2018   Procedure: PERIPHERAL VASCULAR BALLOON ANGIOPLASTY;  Surgeon: Cephus Shelling, MD;  Location: [facility] INVASIVE CV LAB;  Service: Cardiovascular;;  left a/v fistula     Social History   Tobacco Use  Smoking Status Former  Smokeless Tobacco Never    Social History   Substance and Sexual Activity  Alcohol Use No     Family History  Problem Relation Age of Onset   Kidney failure Mother    Diabetes Father    Blindness Brother       Current Outpatient Medications:    atorvastatin (LIPITOR) 20 MG tablet, Take 20 mg by mouth  daily., Disp: , Rfl:    calcitRIOL (ROCALTROL) 0.25 MCG capsule, Take 5 capsules (1.25 mcg total) by mouth every Tuesday, Thursday, and Saturday at 6 PM., Disp: 30 capsule, Rfl: 0   ENTRESTO 24-26 MG, Take 1 tablet by mouth 2 (two) times daily., Disp: , Rfl:    gabapentin (NEURONTIN) 300 MG capsule, Take 300 mg by mouth at bedtime., Disp: , Rfl:    loperamide (IMODIUM) 2 MG capsule, Take 1 capsule (2 mg total) by mouth 4 (four) times daily as  needed for diarrhea or loose stools., Disp: 12 capsule, Rfl: 0   methocarbamol (ROBAXIN) 500 MG tablet, Take 1 tablet (500 mg total) by mouth every 8 (eight) hours as needed for muscle spasms., Disp: 20 tablet, Rfl: 0   metoprolol succinate (TOPROL-XL) 25 MG 24 hr tablet, Take 37.5 mg by mouth daily., Disp: , Rfl:    ondansetron (ZOFRAN-ODT) 4 MG disintegrating tablet, Take 1 tablet (4 mg total) by mouth every 8 (eight) hours as needed for nausea or vomiting., Disp: 20 tablet, Rfl: 0   pantoprazole (PROTONIX) 40 MG tablet, Take 1 tablet (40 mg total) by mouth 2 (two) times daily before a meal. Twice a day x 4 weeks, then daily, Disp: 60 tablet, Rfl: 3   sertraline (ZOLOFT) 25 MG tablet, Take 25 mg by mouth 3 (three) times a week. M W F, Disp: , Rfl:    Cardiovascular and other pertinent studies:  Reviewed external labs and tests, independently interpreted  EKG 11/28/2022: Sinus rhythm 74 bpm Leftward axis Poor R-wave progression  Low voltage   CTA chest 10/17/2022: 1. Normal caliber aorta with no evidence of acute aortic syndrome. 2. Mild diffuse ground-glass opacities, likely due to pulmonary edema. 3. Bladder wall thickening, likely due to chronic bladder outlet obstruction in the setting of prostatomegaly. Correlate with urinalysis to exclude cystitis. 4. Mildly nodular liver contour, suggestive of cirrhosis. 5. Aortic Atherosclerosis (ICD10-I70.0).  Echocardiogram 06/05/2022: 1. The left ventricle is mildly dilated in size with mildly increased wall  thickness.  2. The left ventricular systolic function is moderately to severely  decreased, LVEF is visually estimated at 30%.  3. The right ventricle is moderately dilated in size, with reduced systolic  function.  4. There is grade II diastolic dysfunction (elevated filling pressure).  5. The tricuspid valve leaflets with poor coaptation.  6. There is mild mitral valve regurgitation.  7. There is severe tricuspid regurgitation.   8. There is moderate pulmonary hypertension.   Coronary angiography 07/09/2021: - No significant coronary artery disease  - Large main branch coronary arteries  - Elevated LVEDP    Echocardiogram 06/11/2021: ([facility]) 1. The left ventricle is mildly to moderately dilated in size with mildly  increased wall thickness.  2. The left ventricular systolic function is severely decreased, LVEF is  visually estimated at 20-25%.  3. There is moderate to severe posteriorly directed mitral regurgitation.  4. The left atrium is moderately dilated in size.  5. The right ventricle is moderately dilated in size, with mildly reduced  systolic function.  6. The tricuspid valve leaflets are mildly thickened, with normal leaflet  mobility.  7. There is mild pulmonary hypertension.  8. IVC size and inspiratory change suggest mildly elevated right atrial  pressure. (5-10 mmHg).    Recent labs: 10/17/2022: Glucose 117, BUN/Cr 50/10.4. EGFR 5. Na/K 133/4.2.  H/H 14/43. MCV 99. Platelets 140  06/05/2022: Glucose 101, BUN/Cr 22/6.72. EGFR 8. Na/K 141/4.1.  HbA1C 5.9% Chol 113, TG 58, HDL  44, LDL 57 TSH 3.6 normal   Review of Systems  Cardiovascular:  Positive for dyspnea on exertion. Negative for chest pain, leg swelling, palpitations and syncope.         Vitals:   11/28/22 1447  BP: (!) 93/59  Pulse: 85  Resp: 16  SpO2: 95%     Body mass index is 29.86 kg/m. Filed Weights   11/28/22 1447  Weight: 202 lb 3.2 oz (91.7 kg)     Objective:   Physical Exam Vitals and nursing note reviewed.  Constitutional:      General: He is not in acute distress. Neck:     Vascular: No JVD.  Cardiovascular:     Rate and Rhythm: Normal rate and regular rhythm.     Heart sounds: Normal heart sounds. No murmur heard. Pulmonary:     Effort: Pulmonary effort is normal.     Breath sounds: Normal breath sounds. No wheezing or rales.  Musculoskeletal:     Right lower leg: No edema.     Left lower leg:  No edema.          Visit diagnoses:   ICD-10-CM   1. Exertional dyspnea  R06.09 EKG 12-Lead    2. HFrEF (heart failure with reduced ejection fraction) (HCC)  I50.20 Amb Referral to Cardiac Rehabilitation    AMB referral to CHF clinic    3. Nonischemic cardiomyopathy (HCC)  I42.8        Orders Placed This Encounter  Procedures   Amb Referral to Cardiac Rehabilitation   AMB referral to CHF clinic   EKG 12-Lead      Assessment & Recommendations:   67 y.o. African-American male with HFrEF, ESRD on hemodialysis  HFrEF: Nonischemic cardiomyopathy. Between echocardiograms at [facility] and 3/23, in 3/24, there is improvement in mitral regurgitation, although tricuspid regurgitation remains severe. There is severe biventricular heart failure. Clinically, he appears euvolemic on exam, but has exertional dyspnea symptoms NYHA class II. His GDMT is severely limited due to low blood pressures.  This is likely due to his other underlying morbidities including ESRD, as well as cirrhosis noted on CT scan.  I suspect cirrhosis could be cardiac in origin. Continue Entresto 24-26 mg twice daily, metoprolol succinate 25 mg daily. Further up titration of GDMT will be limited.  QRS is duration is normal, therefore not a candidate for CRT. I will refer him to cardiac rehab. In addition, I will refer him to heart failure specialty clinic to assess he would be candidate for any other normal therapies for heart failure management.  I will see him on as-needed basis.   Thank you for referring the patient to [place]. Please feel free to contact with any questions.   Elder Negus, MD Pager: [phone] Office: [phone]

## 2022-11-30 DIAGNOSIS — E1129 Type 2 diabetes mellitus with other diabetic kidney complication: Secondary | ICD-10-CM | POA: Diagnosis not present

## 2022-11-30 DIAGNOSIS — N186 End stage renal disease: Secondary | ICD-10-CM | POA: Diagnosis not present

## 2022-11-30 DIAGNOSIS — R52 Pain, unspecified: Secondary | ICD-10-CM | POA: Diagnosis not present

## 2022-11-30 DIAGNOSIS — N2581 Secondary hyperparathyroidism of renal origin: Secondary | ICD-10-CM | POA: Diagnosis not present

## 2022-11-30 DIAGNOSIS — Z992 Dependence on renal dialysis: Secondary | ICD-10-CM | POA: Diagnosis not present

## 2022-11-30 DIAGNOSIS — L299 Pruritus, unspecified: Secondary | ICD-10-CM | POA: Diagnosis not present

## 2022-11-30 DIAGNOSIS — D631 Anemia in chronic kidney disease: Secondary | ICD-10-CM | POA: Diagnosis not present

## 2022-11-30 DIAGNOSIS — R197 Diarrhea, unspecified: Secondary | ICD-10-CM | POA: Diagnosis not present

## 2022-12-02 ENCOUNTER — Encounter (HOSPITAL_COMMUNITY): Payer: Self-pay

## 2022-12-03 DIAGNOSIS — N186 End stage renal disease: Secondary | ICD-10-CM | POA: Diagnosis not present

## 2022-12-03 DIAGNOSIS — D631 Anemia in chronic kidney disease: Secondary | ICD-10-CM | POA: Diagnosis not present

## 2022-12-03 DIAGNOSIS — R52 Pain, unspecified: Secondary | ICD-10-CM | POA: Diagnosis not present

## 2022-12-03 DIAGNOSIS — R197 Diarrhea, unspecified: Secondary | ICD-10-CM | POA: Diagnosis not present

## 2022-12-03 DIAGNOSIS — N2581 Secondary hyperparathyroidism of renal origin: Secondary | ICD-10-CM | POA: Diagnosis not present

## 2022-12-03 DIAGNOSIS — L299 Pruritus, unspecified: Secondary | ICD-10-CM | POA: Diagnosis not present

## 2022-12-03 DIAGNOSIS — Z992 Dependence on renal dialysis: Secondary | ICD-10-CM | POA: Diagnosis not present

## 2022-12-03 DIAGNOSIS — E1129 Type 2 diabetes mellitus with other diabetic kidney complication: Secondary | ICD-10-CM | POA: Diagnosis not present

## 2022-12-04 DIAGNOSIS — N185 Chronic kidney disease, stage 5: Secondary | ICD-10-CM | POA: Diagnosis not present

## 2022-12-04 DIAGNOSIS — I5022 Chronic systolic (congestive) heart failure: Secondary | ICD-10-CM | POA: Diagnosis not present

## 2022-12-04 DIAGNOSIS — N2581 Secondary hyperparathyroidism of renal origin: Secondary | ICD-10-CM | POA: Diagnosis not present

## 2022-12-04 DIAGNOSIS — Z992 Dependence on renal dialysis: Secondary | ICD-10-CM | POA: Diagnosis not present

## 2022-12-04 DIAGNOSIS — N186 End stage renal disease: Secondary | ICD-10-CM | POA: Diagnosis not present

## 2022-12-04 DIAGNOSIS — E103513 Type 1 diabetes mellitus with proliferative diabetic retinopathy with macular edema, bilateral: Secondary | ICD-10-CM | POA: Diagnosis not present

## 2022-12-04 DIAGNOSIS — Z23 Encounter for immunization: Secondary | ICD-10-CM | POA: Diagnosis not present

## 2022-12-05 DIAGNOSIS — N186 End stage renal disease: Secondary | ICD-10-CM | POA: Diagnosis not present

## 2022-12-05 DIAGNOSIS — R197 Diarrhea, unspecified: Secondary | ICD-10-CM | POA: Diagnosis not present

## 2022-12-05 DIAGNOSIS — E1129 Type 2 diabetes mellitus with other diabetic kidney complication: Secondary | ICD-10-CM | POA: Diagnosis not present

## 2022-12-05 DIAGNOSIS — R52 Pain, unspecified: Secondary | ICD-10-CM | POA: Diagnosis not present

## 2022-12-05 DIAGNOSIS — Z992 Dependence on renal dialysis: Secondary | ICD-10-CM | POA: Diagnosis not present

## 2022-12-05 DIAGNOSIS — D631 Anemia in chronic kidney disease: Secondary | ICD-10-CM | POA: Diagnosis not present

## 2022-12-05 DIAGNOSIS — L299 Pruritus, unspecified: Secondary | ICD-10-CM | POA: Diagnosis not present

## 2022-12-05 DIAGNOSIS — N2581 Secondary hyperparathyroidism of renal origin: Secondary | ICD-10-CM | POA: Diagnosis not present

## 2022-12-07 DIAGNOSIS — Z992 Dependence on renal dialysis: Secondary | ICD-10-CM | POA: Diagnosis not present

## 2022-12-07 DIAGNOSIS — E1129 Type 2 diabetes mellitus with other diabetic kidney complication: Secondary | ICD-10-CM | POA: Diagnosis not present

## 2022-12-07 DIAGNOSIS — N2581 Secondary hyperparathyroidism of renal origin: Secondary | ICD-10-CM | POA: Diagnosis not present

## 2022-12-07 DIAGNOSIS — N186 End stage renal disease: Secondary | ICD-10-CM | POA: Diagnosis not present

## 2022-12-07 DIAGNOSIS — R52 Pain, unspecified: Secondary | ICD-10-CM | POA: Diagnosis not present

## 2022-12-07 DIAGNOSIS — D631 Anemia in chronic kidney disease: Secondary | ICD-10-CM | POA: Diagnosis not present

## 2022-12-07 DIAGNOSIS — L299 Pruritus, unspecified: Secondary | ICD-10-CM | POA: Diagnosis not present

## 2022-12-07 DIAGNOSIS — R197 Diarrhea, unspecified: Secondary | ICD-10-CM | POA: Diagnosis not present

## 2022-12-10 ENCOUNTER — Telehealth (HOSPITAL_COMMUNITY): Payer: Self-pay | Admitting: *Deleted

## 2022-12-10 ENCOUNTER — Telehealth (HOSPITAL_COMMUNITY): Payer: Self-pay

## 2022-12-10 DIAGNOSIS — N2581 Secondary hyperparathyroidism of renal origin: Secondary | ICD-10-CM | POA: Diagnosis not present

## 2022-12-10 DIAGNOSIS — R197 Diarrhea, unspecified: Secondary | ICD-10-CM | POA: Diagnosis not present

## 2022-12-10 DIAGNOSIS — N186 End stage renal disease: Secondary | ICD-10-CM | POA: Diagnosis not present

## 2022-12-10 DIAGNOSIS — E1129 Type 2 diabetes mellitus with other diabetic kidney complication: Secondary | ICD-10-CM | POA: Diagnosis not present

## 2022-12-10 DIAGNOSIS — D631 Anemia in chronic kidney disease: Secondary | ICD-10-CM | POA: Diagnosis not present

## 2022-12-10 DIAGNOSIS — Z992 Dependence on renal dialysis: Secondary | ICD-10-CM | POA: Diagnosis not present

## 2022-12-10 DIAGNOSIS — L299 Pruritus, unspecified: Secondary | ICD-10-CM | POA: Diagnosis not present

## 2022-12-10 DIAGNOSIS — R52 Pain, unspecified: Secondary | ICD-10-CM | POA: Diagnosis not present

## 2022-12-10 NOTE — Telephone Encounter (Signed)
Pt wife Scarlette Calico called in regards to pt Jonathan Mcneil. Jeani Hawking we do not have a DPR on file to release information to her. She stated she will have pt give [place] a call back later.

## 2022-12-10 NOTE — Telephone Encounter (Signed)
-----   Message from [facility] sent at 12/10/2022  2:57 PM EDT ----- Regarding: RE: BP Parameters for Cardiac Rehab If asymptomatic, SBP 80-85 should be acceptable.   Thank you  Manish ----- Message ----- From: Chelsea Aus, RN Sent: 12/10/2022   1:48 PM EDT To: Elder Negus, MD Subject: BP Parameters for Cardiac Rehab                 Dr. Rosemary Holms,  Thank you for the referral for this pt to participate in Cardiac rehab.  Noted in his history ESRD with HD on T, Th and Sat and Hypotension (at times symptomatic).    What is an acceptable asymptomatic BP for this pt during Cardiac Rehab exercise sessions (M, W, F)?  Thank you for your input and support of Cardiac Rehab  Karlene Lineman RN, BSN Cardiac and Pulmonary Rehab Nurse Navigator

## 2022-12-10 NOTE — Telephone Encounter (Signed)
PT called in regards to his referral for Cardiac Rehab. He stated " I just got out of dialysis. MY wife Scarlette Calico is with me and I am giving you permission to talk with her about my referral." I did advise pt to update his DPR and he verbalized he understood.  I informed frances we were trying to reach Julius in regards to Cardiac Rehab and seeing if he was interested. Patient expressed interest. I explained the scheduling process and patient and his wife verbalized understanding. They are going to wait on [place] to call and schedule. They both agreed if no one is able to answer to please leave a VM.

## 2022-12-12 DIAGNOSIS — N186 End stage renal disease: Secondary | ICD-10-CM | POA: Diagnosis not present

## 2022-12-12 DIAGNOSIS — E1129 Type 2 diabetes mellitus with other diabetic kidney complication: Secondary | ICD-10-CM | POA: Diagnosis not present

## 2022-12-12 DIAGNOSIS — D631 Anemia in chronic kidney disease: Secondary | ICD-10-CM | POA: Diagnosis not present

## 2022-12-12 DIAGNOSIS — N2581 Secondary hyperparathyroidism of renal origin: Secondary | ICD-10-CM | POA: Diagnosis not present

## 2022-12-12 DIAGNOSIS — L299 Pruritus, unspecified: Secondary | ICD-10-CM | POA: Diagnosis not present

## 2022-12-12 DIAGNOSIS — Z992 Dependence on renal dialysis: Secondary | ICD-10-CM | POA: Diagnosis not present

## 2022-12-12 DIAGNOSIS — R52 Pain, unspecified: Secondary | ICD-10-CM | POA: Diagnosis not present

## 2022-12-12 DIAGNOSIS — R197 Diarrhea, unspecified: Secondary | ICD-10-CM | POA: Diagnosis not present

## 2022-12-14 DIAGNOSIS — L299 Pruritus, unspecified: Secondary | ICD-10-CM | POA: Diagnosis not present

## 2022-12-14 DIAGNOSIS — D631 Anemia in chronic kidney disease: Secondary | ICD-10-CM | POA: Diagnosis not present

## 2022-12-14 DIAGNOSIS — N2581 Secondary hyperparathyroidism of renal origin: Secondary | ICD-10-CM | POA: Diagnosis not present

## 2022-12-14 DIAGNOSIS — E1129 Type 2 diabetes mellitus with other diabetic kidney complication: Secondary | ICD-10-CM | POA: Diagnosis not present

## 2022-12-14 DIAGNOSIS — R197 Diarrhea, unspecified: Secondary | ICD-10-CM | POA: Diagnosis not present

## 2022-12-14 DIAGNOSIS — Z992 Dependence on renal dialysis: Secondary | ICD-10-CM | POA: Diagnosis not present

## 2022-12-14 DIAGNOSIS — R52 Pain, unspecified: Secondary | ICD-10-CM | POA: Diagnosis not present

## 2022-12-14 DIAGNOSIS — N186 End stage renal disease: Secondary | ICD-10-CM | POA: Diagnosis not present

## 2022-12-16 ENCOUNTER — Telehealth (HOSPITAL_COMMUNITY): Payer: Self-pay

## 2022-12-16 ENCOUNTER — Encounter (HOSPITAL_COMMUNITY): Payer: Self-pay

## 2022-12-16 NOTE — Telephone Encounter (Signed)
Attempted to call patient in regards to Cardiac Rehab - LM on VM Mailed letter 

## 2022-12-17 DIAGNOSIS — N2581 Secondary hyperparathyroidism of renal origin: Secondary | ICD-10-CM | POA: Diagnosis not present

## 2022-12-17 DIAGNOSIS — Z992 Dependence on renal dialysis: Secondary | ICD-10-CM | POA: Diagnosis not present

## 2022-12-17 DIAGNOSIS — R52 Pain, unspecified: Secondary | ICD-10-CM | POA: Diagnosis not present

## 2022-12-17 DIAGNOSIS — L299 Pruritus, unspecified: Secondary | ICD-10-CM | POA: Diagnosis not present

## 2022-12-17 DIAGNOSIS — E1129 Type 2 diabetes mellitus with other diabetic kidney complication: Secondary | ICD-10-CM | POA: Diagnosis not present

## 2022-12-17 DIAGNOSIS — N186 End stage renal disease: Secondary | ICD-10-CM | POA: Diagnosis not present

## 2022-12-17 DIAGNOSIS — R197 Diarrhea, unspecified: Secondary | ICD-10-CM | POA: Diagnosis not present

## 2022-12-17 DIAGNOSIS — D631 Anemia in chronic kidney disease: Secondary | ICD-10-CM | POA: Diagnosis not present

## 2022-12-19 DIAGNOSIS — N186 End stage renal disease: Secondary | ICD-10-CM | POA: Diagnosis not present

## 2022-12-19 DIAGNOSIS — E1129 Type 2 diabetes mellitus with other diabetic kidney complication: Secondary | ICD-10-CM | POA: Diagnosis not present

## 2022-12-19 DIAGNOSIS — N2581 Secondary hyperparathyroidism of renal origin: Secondary | ICD-10-CM | POA: Diagnosis not present

## 2022-12-19 DIAGNOSIS — R197 Diarrhea, unspecified: Secondary | ICD-10-CM | POA: Diagnosis not present

## 2022-12-19 DIAGNOSIS — D631 Anemia in chronic kidney disease: Secondary | ICD-10-CM | POA: Diagnosis not present

## 2022-12-19 DIAGNOSIS — L299 Pruritus, unspecified: Secondary | ICD-10-CM | POA: Diagnosis not present

## 2022-12-19 DIAGNOSIS — Z992 Dependence on renal dialysis: Secondary | ICD-10-CM | POA: Diagnosis not present

## 2022-12-19 DIAGNOSIS — R52 Pain, unspecified: Secondary | ICD-10-CM | POA: Diagnosis not present

## 2022-12-20 ENCOUNTER — Telehealth (HOSPITAL_COMMUNITY): Payer: Self-pay

## 2022-12-20 NOTE — Telephone Encounter (Signed)
Attempted to call pt a 2nd time- LM ON VM   No response from pt.   Closed referral

## 2022-12-21 DIAGNOSIS — R197 Diarrhea, unspecified: Secondary | ICD-10-CM | POA: Diagnosis not present

## 2022-12-21 DIAGNOSIS — N186 End stage renal disease: Secondary | ICD-10-CM | POA: Diagnosis not present

## 2022-12-21 DIAGNOSIS — D631 Anemia in chronic kidney disease: Secondary | ICD-10-CM | POA: Diagnosis not present

## 2022-12-21 DIAGNOSIS — R52 Pain, unspecified: Secondary | ICD-10-CM | POA: Diagnosis not present

## 2022-12-21 DIAGNOSIS — N2581 Secondary hyperparathyroidism of renal origin: Secondary | ICD-10-CM | POA: Diagnosis not present

## 2022-12-21 DIAGNOSIS — Z992 Dependence on renal dialysis: Secondary | ICD-10-CM | POA: Diagnosis not present

## 2022-12-21 DIAGNOSIS — L299 Pruritus, unspecified: Secondary | ICD-10-CM | POA: Diagnosis not present

## 2022-12-21 DIAGNOSIS — E1129 Type 2 diabetes mellitus with other diabetic kidney complication: Secondary | ICD-10-CM | POA: Diagnosis not present

## 2022-12-23 DIAGNOSIS — E1122 Type 2 diabetes mellitus with diabetic chronic kidney disease: Secondary | ICD-10-CM | POA: Diagnosis not present

## 2022-12-23 DIAGNOSIS — Z992 Dependence on renal dialysis: Secondary | ICD-10-CM | POA: Diagnosis not present

## 2022-12-23 DIAGNOSIS — N186 End stage renal disease: Secondary | ICD-10-CM | POA: Diagnosis not present

## 2022-12-24 DIAGNOSIS — R197 Diarrhea, unspecified: Secondary | ICD-10-CM | POA: Diagnosis not present

## 2022-12-24 DIAGNOSIS — N2581 Secondary hyperparathyroidism of renal origin: Secondary | ICD-10-CM | POA: Diagnosis not present

## 2022-12-24 DIAGNOSIS — E1129 Type 2 diabetes mellitus with other diabetic kidney complication: Secondary | ICD-10-CM | POA: Diagnosis not present

## 2022-12-24 DIAGNOSIS — R52 Pain, unspecified: Secondary | ICD-10-CM | POA: Diagnosis not present

## 2022-12-24 DIAGNOSIS — N186 End stage renal disease: Secondary | ICD-10-CM | POA: Diagnosis not present

## 2022-12-24 DIAGNOSIS — D631 Anemia in chronic kidney disease: Secondary | ICD-10-CM | POA: Diagnosis not present

## 2022-12-24 DIAGNOSIS — L299 Pruritus, unspecified: Secondary | ICD-10-CM | POA: Diagnosis not present

## 2022-12-24 DIAGNOSIS — Z992 Dependence on renal dialysis: Secondary | ICD-10-CM | POA: Diagnosis not present

## 2022-12-25 NOTE — Progress Notes (Signed)
[facility] - Clinic Note  01/01/2023     CHIEF COMPLAINT Patient presents for Retina Follow Up  HISTORY OF PRESENT ILLNESS: Jonathan Mcneil is a 67 y.o. male who presents to the clinic today for:   HPI     Retina Follow Up   Patient presents with  Diabetic Retinopathy.  In both eyes.  This started 6 weeks ago.  I, the attending physician,  performed the HPI with the patient and updated documentation appropriately.        Comments   Patient here for 6 weeks retina follow up for PDR OU. Patient states vision about the same. No eye pain. Eye itches sometimes.       Last edited by Rennis Chris, MD on 01/01/2023 11:38 PM.      Referring physician: Georgianne Fick, MD [address] [place],  [place] [address]  HISTORICAL INFORMATION:   Selected notes from the MEDICAL RECORD NUMBER Referred by Dr. Aletta Edouard for heme OD   CURRENT MEDICATIONS: No current outpatient medications on file. (Ophthalmic Drugs)   No current facility-administered medications for this visit. (Ophthalmic Drugs)   Current Outpatient Medications (Other)  Medication Sig   atorvastatin (LIPITOR) 20 MG tablet Take 20 mg by mouth daily.   calcitRIOL (ROCALTROL) 0.25 MCG capsule Take 5 capsules (1.25 mcg total) by mouth every Tuesday, Thursday, and Saturday at 6 PM.   ENTRESTO 24-26 MG Take 1 tablet by mouth 2 (two) times daily.   gabapentin (NEURONTIN) 300 MG capsule Take 300 mg by mouth at bedtime.   loperamide (IMODIUM) 2 MG capsule Take 1 capsule (2 mg total) by mouth 4 (four) times daily as needed for diarrhea or loose stools.   methocarbamol (ROBAXIN) 500 MG tablet Take 1 tablet (500 mg total) by mouth every 8 (eight) hours as needed for muscle spasms.   metoprolol succinate (TOPROL-XL) 25 MG 24 hr tablet Take 37.5 mg by mouth daily.   ondansetron (ZOFRAN-ODT) 4 MG disintegrating tablet Take 1 tablet (4 mg total) by mouth every 8 (eight) hours as needed for nausea  or vomiting.   pantoprazole (PROTONIX) 40 MG tablet Take 1 tablet (40 mg total) by mouth 2 (two) times daily before a meal. Twice a day x 4 weeks, then daily   sertraline (ZOLOFT) 25 MG tablet Take 25 mg by mouth 3 (three) times a week. M W F   No current facility-administered medications for this visit. (Other)   REVIEW OF SYSTEMS: ROS   Positive for: Gastrointestinal, Neurological, Genitourinary, Endocrine, Eyes Negative for: Constitutional, Skin, Musculoskeletal, HENT, Cardiovascular, Respiratory, Psychiatric, Allergic/Imm, Heme/Lymph Last edited by Laddie Aquas, COA on 01/01/2023  8:29 AM.     ALLERGIES Allergies  Allergen Reactions   Gabapentin Nausea Only   Ibuprofen Hives   Penicillins Itching and Other (See Comments)    Has patient had a PCN reaction causing immediate rash, facial/tongue/throat swelling, SOB or lightheadedness with hypotension: No Has patient had a PCN reaction causing severe rash involving mucus membranes or skin necrosis: No Has patient had a PCN reaction that required hospitalization No Has patient had a PCN reaction occurring within the last 10 years: Unknown If all of the above answers are "NO", then may proceed with Cephalosporin use.   Venlafaxine Other (See Comments)    insomnia   PAST MEDICAL HISTORY Past Medical History:  Diagnosis Date   Cataract    Diabetes mellitus without complication (HCC)    Diabetic retinopathy (HCC)  ESRD (end stage renal disease) (HCC)    GERD (gastroesophageal reflux disease)    PMH   Hypertension    Hypertensive retinopathy    Low back pain    Lung disease    Metabolic acidosis    Neuromuscular disorder (HCC)    peripheral neuropathy   Pancreatitis    Schizophrenia (HCC)    does not take medications   Past Surgical History:  Procedure Laterality Date   A/V FISTULAGRAM Left 06/03/2018   Procedure: A/V FISTULAGRAM;  Surgeon: Cephus Shelling, MD;  Location: [facility];  Service:  Cardiovascular;  Laterality: Left;   AV FISTULA PLACEMENT Left 02/11/2018   Procedure: INSERTION OF ARTERIOVENOUS (AV) FISTULA LEFT  ARM;  Surgeon: Maeola Harman, MD;  Location: [facility] OR;  Service: Vascular;  Laterality: Left;   BASCILIC VEIN TRANSPOSITION Left 04/17/2018   Procedure: BASILIC VEIN TRANSPOSITION SECOND STAGE;  Surgeon: Maeola Harman, MD;  Location: [facility] OR;  Service: Vascular;  Laterality: Left;   BIOPSY  10/20/2019   Procedure: BIOPSY;  Surgeon: Vida Rigger, MD;  Location: [facility] ENDOSCOPY;  Service: Endoscopy;;   COLONOSCOPY     DENTAL SURGERY     ESOPHAGOGASTRODUODENOSCOPY N/A 06/16/2021   Procedure: ESOPHAGOGASTRODUODENOSCOPY (EGD);  Surgeon: Charlott Rakes, MD;  Location: [facility] ENDOSCOPY;  Service: Gastroenterology;  Laterality: N/A;   ESOPHAGOGASTRODUODENOSCOPY (EGD) WITH PROPOFOL N/A 10/20/2019   Procedure: ESOPHAGOGASTRODUODENOSCOPY (EGD) WITH PROPOFOL;  Surgeon: Vida Rigger, MD;  Location: [facility] ENDOSCOPY;  Service: Endoscopy;  Laterality: N/A;   INSERTION OF DIALYSIS CATHETER Right 02/25/2018   Procedure: INSERTION OF DIALYSIS CATHETER;  Surgeon: Maeola Harman, MD;  Location: [facility] OR;  Service: Vascular;  Laterality: Right;   PERIPHERAL VASCULAR BALLOON ANGIOPLASTY  06/03/2018   Procedure: PERIPHERAL VASCULAR BALLOON ANGIOPLASTY;  Surgeon: Cephus Shelling, MD;  Location: [facility]AS[facility] CV [facility];  Service: Cardiovascular;;  left a/v fistula   FAMILY HISTORY Family History  Problem Relation Age of Onset   Kidney failure Mother    Diabetes Father    Blindness Brother    SOCIAL HISTORY Social History   Tobacco Use   Smoking status: Former   Smokeless tobacco: Never  Advertising account planner   Vaping status: Never Used  Substance Use Topics   Alcohol use: No   Drug use: Not Currently    Types: Cocaine, Marijuana    Comment: smoked marijuana a few days ago; he denies using cocaine       OPHTHALMIC EXAM:  Base Eye Exam     Visual Acuity (Snellen -  Linear)       Right Left   Dist [place] 20/25 -2 20/20   Dist ph [place] 20/25 +2          Tonometry (Tonopen, 8:28 AM)       Right Left   Pressure 15 14         Pupils       Dark Light Shape React APD   Right 2 2 Round Minimal None   Left 2 2 Round Minimal None         Visual Fields (Counting fingers)       Left Right    Full Full         Extraocular Movement       Right Left    Full, Ortho Full, Ortho         Neuro/Psych     Oriented x3: Yes   Mood/Affect: Normal         Dilation  Both eyes: 1.0% Mydriacyl, 2.5% Phenylephrine @ 8:28 AM           Slit Lamp and Fundus Exam     Slit Lamp Exam       Right Left   Lids/Lashes Dermatochalasis - upper lid Dermatochalasis - upper lid   Conjunctiva/Sclera Melanosis Melanosis, temporal pinguecula   Cornea arcus, tear film debris arcus   Anterior Chamber Deep and quiet Deep and quiet   Iris Round and dilated, No NVI Round and poorly dilated, No NVI   Lens 2-3+ Nuclear sclerosis, 3+ Cortical cataract 2-3+ Nuclear sclerosis, 3+ Cortical cataract   Anterior Vitreous Vitreous syneresis, old white blood clots settled inferiorly, no heme centrally Vitreous syneresis, old white blood clots settled inferiorly, no heme centrally, Posterior vitreous detachment         Fundus Exam       Right Left   Disc Mild Pallor, Sharp rim, focal PPP/PPA Mild Pallor, Sharp rim   C/D Ratio 0.5 0.5   Macula Flat, Good foveal reflex, RPE mottling and clumping, trace cystic changes temporal macula -- improved, rare MA, +ERM Flat, good foveal reflex, ERM, RPE mottling and clumping, rare MA, no edema   Vessels attenuated, Tortuous attenuated, Tortuous   Periphery Attached, 360 IRH/DBH - improved, pre-retinal heme settled inferiorly, 360 PRP with room for fill in Attached, scattered IRH/DBH greatest nasally, good 360 PRP changes with room for fill in, No RT/RD           IMAGING AND PROCEDURES  Imaging and Procedures for  01/01/2023  OCT, Retina - OU - Both Eyes       Right Eye Quality was good. Central Foveal Thickness: 274. Progression has improved. Findings include normal foveal contour, no SRF, intraretinal hyper-reflective material, epiretinal membrane, intraretinal fluid (Mild interval improvement in vitreous opacities, cystic changes temporal macula -- improved, mild ERM).   Left Eye Quality was good. Central Foveal Thickness: 214. Progression has been stable. Findings include no SRF, abnormal foveal contour, intraretinal hyper-reflective material, epiretinal membrane, intraretinal fluid (persistent focal pockets of SRF IN periphery - caught on widefield -- stably improved, ERM with blunted foveal contour, trace cystic changes temporal macula ).   Notes *Images captured and stored on drive  Diagnosis / Impression:  OD: Mild interval improvement in vitreous opacities, cystic changes temporal macula -- improved, mild ERM OS: persistent focal pockets of SRF IN periphery - caught on widefield -- stably improved, ERM with blunted foveal contour, trace cystic changes temporal macula -- improved  Clinical management:  See below  Abbreviations: NFP - Normal foveal profile. CME - cystoid macular edema. PED - pigment epithelial detachment. IRF - intraretinal fluid. SRF - subretinal fluid. EZ - ellipsoid zone. ERM - epiretinal membrane. ORA - outer retinal atrophy. ORT - outer retinal tubulation. SRHM - subretinal hyper-reflective material. IRHM - intraretinal hyper-reflective material      Fluorescein Angiography Optos (Transit OS)       Right Eye Progression has improved. Early phase findings include staining, microaneurysm, vascular perfusion defect. Mid/Late phase findings include leakage, staining, microaneurysm (NV stably regressed, mild leaking MA).   Left Eye Progression has improved. Early phase findings include staining, microaneurysm. Mid/Late phase findings include leakage, staining,  microaneurysm (Interval improvement in focal NVE IN periphery -- no leakage; mild perifoveal leakage).   Notes **Images stored on drive**  Impression: PDR OU s/p PRP OU OD: NV stably regressed, mild leaking MA OS: Interval improvement in focal NVE IN periphery -- no leakage; mild perifoveal  leakage       Intravitreal Injection, Pharmacologic Agent - OD - Right Eye       Time Out 01/01/2023. 9:42 AM. Confirmed correct patient, procedure, site, and patient consented.   Anesthesia Topical anesthesia was used. Anesthetic medications included Lidocaine 2%, Proparacaine 0.5%.   Procedure Preparation included 5% betadine to ocular surface, eyelid speculum. A (32g) needle was used.   Injection: 1.25 mg Bevacizumab 1.25mg /0.44ml   Route: Intravitreal, Site: Right Eye   NDC: [id number], Lot: [id number], Expiration date: 02/16/2023   Post-op Post injection exam found visual acuity of at least counting fingers. The patient tolerated the procedure well. There were no complications. The patient received written and verbal post procedure care education. Post injection medications were not given.            ASSESSMENT/PLAN:   ICD-10-CM   1. Proliferative diabetic retinopathy of both eyes with macular edema associated with type 2 diabetes mellitus (HCC)  E11.3513 OCT, Retina - OU - Both Eyes    Fluorescein Angiography Optos (Transit OS)    Intravitreal Injection, Pharmacologic Agent - OD - Right Eye    Bevacizumab (AVASTIN) SOLN 1.25 mg    2. Vitreous hemorrhage of left eye (HCC)  H43.12     3. Vitreous hemorrhage, right eye (HCC)  H43.11     4. Posterior vitreous detachment of left eye  H43.812     5. Essential hypertension  I10     6. Hypertensive retinopathy of both eyes  H35.033 Fluorescein Angiography Optos (Transit OS)    7. Epiretinal membrane (ERM) of left eye  H35.372     8. Combined forms of age-related cataract of both eyes  H25.813     9. Bilateral ocular  hypertension  H40.053      1-3. Proliferative diabetic retinopathy w/ recurrent VH OU (OD > OS)  - A1c: 5.9 on 03.13.24 - s/p IVA OD #1 (12.08.21), #2 (01.11.22), #3 (02.09.22), #4 (11.04.22), #5 (12.02.22), #6 (12.30.22), #7 (01.27.23), #8 (01.05.24), #9 (02.02.24), #10 (08.28.24) - s/p IVA OS #1 (12.10.21), #2 (01.11.22), #3 (02.09.22), #4 (09.09.22), #5 (10.07.22), #6 (11.04.22), #7 (12.02.22), #8 (12.30.22) - FA (12.08.21) shows late-leaking MA, vascular nonperfusion, +NV OU (nasal midzone) - s/p PRP OD (01.26.22) - s/p PRP OS (03.09.22) - repeat FA 6.6.22 shows interval improvement in NVE/leakage OU s/p PRP OU-- just minimal leakage remains - repeat FA 10.09.24 shows OD: NV stably regressed, mild leaking MA; OS: Interval improvement in focal NVE IN periphery -- no leakage; mild perifoveal leakage -- both improved from prior - BCVA 20/25 OD, 20/20 OS - stable OU  - OCT shows OD: Mild interval improvement in vitreous opacities, cystic changes temporal macula -- improved, mild ERM; OS: persistent focal pockets of SRF IN periphery - caught on widefield -- stably improved, ERM with blunted foveal contour, trace cystic changes temporal macula -- improved at 6 weeks  - recommend IVA OD #11 today, 10.09.24 with follow up in 6 weeks again  - pt wishes to proceed with injection  - RBA of procedure discussed, questions answered - IVA informed consent obtained and signed, 01.05.24 (OD) - see procedure note  - f/u 6 weeks, DFE, OCT, possible injections  4. Hemorrhagic PVD OS  - Onset mid-August 2022 w/ new onset floaters, no photopsias  - s/p IVA OS #4 (09.09.22), #5 (10.07.22), #6 (11.04.22), #7 (12.02.22), #8 (12.30.22) - today, VA stably improved to 20/20 OS from 20/40 and 20/80 prior - pt states they have been holding heparin  at dialysis - exam shows interval improvement in vitreous heme -- clearing and settling inferiorly - pt reports previously receiving heparin during dialysis (T, Th, Sat)  -- may have contributed to interval worsening   - Discussed findings and prognosis  - No RT or RD on 360 peripheral exam  - Reviewed s/s of RT/RD  - Strict return precautions for any such RT/RD signs/symptoms  - VH precautions reviewed -- minimize activities, keep head elevated, hold heparin if able, avoid ASA/NSAIDS  5,6. Hypertensive retinopathy OU - discussed importance of tight BP control - monitor  7. Epiretinal membrane, left eye  - mild ERM - BCVA 20/20 - asymptomatic, no metamorphopsia - no indication for surgery at this time - monitor for now - f/u 3 mos -- DFE/OCT  8. Mixed Cataract OU - The symptoms of cataract, surgical options, and treatments and risks were discussed with patient - discussed diagnosis and progression - approaching visual significance - referred to Dr. Dione Booze for cataract eval / consult -- scheduled for this month (October 2024)  9. Ocular Hypertension OU  - IOP today: 15,14  - cont cosopt bid OU    Ophthalmic Meds Ordered this visit:  Meds ordered this encounter  Medications   Bevacizumab (AVASTIN) SOLN 1.25 mg     Return in about 6 weeks (around 02/12/2023) for f/u PDR OU, DFE, OCT.  There are no Patient Instructions on file for this visit.  This document serves as a record of services personally performed by Karie Chimera, MD, PhD. It was created on their behalf by De Blanch, an ophthalmic technician. The creation of this record is the provider's dictation and/or activities during the visit.    Electronically signed by: De Blanch, OA, 01/01/23  11:41 PM  This document serves as a record of services personally performed by Karie Chimera, MD, PhD. It was created on their behalf by Glee Arvin. Manson Passey, OA an ophthalmic technician. The creation of this record is the provider's dictation and/or activities during the visit.    Electronically signed by: Glee Arvin. Manson Passey, OA 01/01/23 11:41 PM  Karie Chimera, M.D., Ph.D. Diseases &  Surgery of the Retina and Vitreous [facility] 01/01/2023   I have reviewed the above documentation for accuracy and completeness, and I agree with the above. Karie Chimera, M.D., Ph.D. 01/01/23 11:44 PM   .Abbreviations: M myopia (nearsighted); A astigmatism; H hyperopia (farsighted); P presbyopia; Mrx spectacle prescription;  CTL contact lenses; OD right eye; OS left eye; OU both eyes  XT exotropia; ET esotropia; PEK punctate epithelial keratitis; PEE punctate epithelial erosions; DES dry eye syndrome; MGD meibomian gland dysfunction; ATs artificial tears; PFAT's preservative free artificial tears; NSC nuclear sclerotic cataract; PSC posterior subcapsular cataract; ERM epi-retinal membrane; PVD posterior vitreous detachment; RD retinal detachment; DM diabetes mellitus; DR diabetic retinopathy; NPDR non-proliferative diabetic retinopathy; PDR proliferative diabetic retinopathy; CSME clinically significant macular edema; DME diabetic macular edema; dbh dot blot hemorrhages; CWS cotton wool spot; POAG primary open angle glaucoma; C/D cup-to-disc ratio; HVF humphrey visual field; GVF goldmann visual field; OCT optical coherence tomography; IOP intraocular pressure; BRVO Branch retinal vein occlusion; CRVO central retinal vein occlusion; CRAO central retinal artery occlusion; BRAO branch retinal artery occlusion; RT retinal tear; SB scleral buckle; PPV pars plana vitrectomy; VH Vitreous hemorrhage; PRP panretinal laser photocoagulation; IVK intravitreal kenalog; VMT vitreomacular traction; MH Macular hole;  NVD neovascularization of the disc; NVE neovascularization elsewhere; AREDS age related eye disease study; ARMD age  related macular degeneration; POAG primary open angle glaucoma; EBMD epithelial/anterior basement membrane dystrophy; ACIOL anterior chamber intraocular lens; IOL intraocular lens; PCIOL posterior chamber intraocular lens; Phaco/IOL phacoemulsification with intraocular  lens placement; PRK photorefractive keratectomy; LASIK laser assisted in situ keratomileusis; HTN hypertension; DM diabetes mellitus; COPD chronic obstructive pulmonary disease

## 2022-12-26 DIAGNOSIS — R197 Diarrhea, unspecified: Secondary | ICD-10-CM | POA: Diagnosis not present

## 2022-12-26 DIAGNOSIS — R52 Pain, unspecified: Secondary | ICD-10-CM | POA: Diagnosis not present

## 2022-12-26 DIAGNOSIS — N2581 Secondary hyperparathyroidism of renal origin: Secondary | ICD-10-CM | POA: Diagnosis not present

## 2022-12-26 DIAGNOSIS — L299 Pruritus, unspecified: Secondary | ICD-10-CM | POA: Diagnosis not present

## 2022-12-26 DIAGNOSIS — E1129 Type 2 diabetes mellitus with other diabetic kidney complication: Secondary | ICD-10-CM | POA: Diagnosis not present

## 2022-12-26 DIAGNOSIS — D631 Anemia in chronic kidney disease: Secondary | ICD-10-CM | POA: Diagnosis not present

## 2022-12-26 DIAGNOSIS — N186 End stage renal disease: Secondary | ICD-10-CM | POA: Diagnosis not present

## 2022-12-26 DIAGNOSIS — Z992 Dependence on renal dialysis: Secondary | ICD-10-CM | POA: Diagnosis not present

## 2022-12-28 DIAGNOSIS — L299 Pruritus, unspecified: Secondary | ICD-10-CM | POA: Diagnosis not present

## 2022-12-28 DIAGNOSIS — R197 Diarrhea, unspecified: Secondary | ICD-10-CM | POA: Diagnosis not present

## 2022-12-28 DIAGNOSIS — E1129 Type 2 diabetes mellitus with other diabetic kidney complication: Secondary | ICD-10-CM | POA: Diagnosis not present

## 2022-12-28 DIAGNOSIS — Z992 Dependence on renal dialysis: Secondary | ICD-10-CM | POA: Diagnosis not present

## 2022-12-28 DIAGNOSIS — N2581 Secondary hyperparathyroidism of renal origin: Secondary | ICD-10-CM | POA: Diagnosis not present

## 2022-12-28 DIAGNOSIS — R52 Pain, unspecified: Secondary | ICD-10-CM | POA: Diagnosis not present

## 2022-12-28 DIAGNOSIS — N186 End stage renal disease: Secondary | ICD-10-CM | POA: Diagnosis not present

## 2022-12-28 DIAGNOSIS — D631 Anemia in chronic kidney disease: Secondary | ICD-10-CM | POA: Diagnosis not present

## 2022-12-31 DIAGNOSIS — L299 Pruritus, unspecified: Secondary | ICD-10-CM | POA: Diagnosis not present

## 2022-12-31 DIAGNOSIS — R197 Diarrhea, unspecified: Secondary | ICD-10-CM | POA: Diagnosis not present

## 2022-12-31 DIAGNOSIS — R52 Pain, unspecified: Secondary | ICD-10-CM | POA: Diagnosis not present

## 2022-12-31 DIAGNOSIS — D631 Anemia in chronic kidney disease: Secondary | ICD-10-CM | POA: Diagnosis not present

## 2022-12-31 DIAGNOSIS — N2581 Secondary hyperparathyroidism of renal origin: Secondary | ICD-10-CM | POA: Diagnosis not present

## 2022-12-31 DIAGNOSIS — N186 End stage renal disease: Secondary | ICD-10-CM | POA: Diagnosis not present

## 2022-12-31 DIAGNOSIS — Z992 Dependence on renal dialysis: Secondary | ICD-10-CM | POA: Diagnosis not present

## 2022-12-31 DIAGNOSIS — E1129 Type 2 diabetes mellitus with other diabetic kidney complication: Secondary | ICD-10-CM | POA: Diagnosis not present

## 2023-01-01 ENCOUNTER — Encounter (INDEPENDENT_AMBULATORY_CARE_PROVIDER_SITE_OTHER): Payer: Self-pay | Admitting: Ophthalmology

## 2023-01-01 ENCOUNTER — Ambulatory Visit (INDEPENDENT_AMBULATORY_CARE_PROVIDER_SITE_OTHER): Payer: Medicare Other | Admitting: Ophthalmology

## 2023-01-01 DIAGNOSIS — H35033 Hypertensive retinopathy, bilateral: Secondary | ICD-10-CM

## 2023-01-01 DIAGNOSIS — H25813 Combined forms of age-related cataract, bilateral: Secondary | ICD-10-CM

## 2023-01-01 DIAGNOSIS — I10 Essential (primary) hypertension: Secondary | ICD-10-CM

## 2023-01-01 DIAGNOSIS — H4312 Vitreous hemorrhage, left eye: Secondary | ICD-10-CM

## 2023-01-01 DIAGNOSIS — H4311 Vitreous hemorrhage, right eye: Secondary | ICD-10-CM

## 2023-01-01 DIAGNOSIS — H40053 Ocular hypertension, bilateral: Secondary | ICD-10-CM

## 2023-01-01 DIAGNOSIS — E113513 Type 2 diabetes mellitus with proliferative diabetic retinopathy with macular edema, bilateral: Secondary | ICD-10-CM

## 2023-01-01 DIAGNOSIS — H35372 Puckering of macula, left eye: Secondary | ICD-10-CM

## 2023-01-01 DIAGNOSIS — H4313 Vitreous hemorrhage, bilateral: Secondary | ICD-10-CM | POA: Diagnosis not present

## 2023-01-01 DIAGNOSIS — H43812 Vitreous degeneration, left eye: Secondary | ICD-10-CM

## 2023-01-01 MED ORDER — BEVACIZUMAB CHEMO INJECTION 1.25MG/0.05ML SYRINGE FOR KALEIDOSCOPE
1.2500 mg | INTRAVITREAL | Status: AC | PRN
Start: 2023-01-01 — End: 2023-01-01
  Administered 2023-01-01: 1.25 mg via INTRAVITREAL

## 2023-01-02 DIAGNOSIS — E1129 Type 2 diabetes mellitus with other diabetic kidney complication: Secondary | ICD-10-CM | POA: Diagnosis not present

## 2023-01-02 DIAGNOSIS — L299 Pruritus, unspecified: Secondary | ICD-10-CM | POA: Diagnosis not present

## 2023-01-02 DIAGNOSIS — N2581 Secondary hyperparathyroidism of renal origin: Secondary | ICD-10-CM | POA: Diagnosis not present

## 2023-01-02 DIAGNOSIS — R197 Diarrhea, unspecified: Secondary | ICD-10-CM | POA: Diagnosis not present

## 2023-01-02 DIAGNOSIS — N186 End stage renal disease: Secondary | ICD-10-CM | POA: Diagnosis not present

## 2023-01-02 DIAGNOSIS — D631 Anemia in chronic kidney disease: Secondary | ICD-10-CM | POA: Diagnosis not present

## 2023-01-02 DIAGNOSIS — R52 Pain, unspecified: Secondary | ICD-10-CM | POA: Diagnosis not present

## 2023-01-02 DIAGNOSIS — Z992 Dependence on renal dialysis: Secondary | ICD-10-CM | POA: Diagnosis not present

## 2023-01-04 DIAGNOSIS — D631 Anemia in chronic kidney disease: Secondary | ICD-10-CM | POA: Diagnosis not present

## 2023-01-04 DIAGNOSIS — L299 Pruritus, unspecified: Secondary | ICD-10-CM | POA: Diagnosis not present

## 2023-01-04 DIAGNOSIS — R197 Diarrhea, unspecified: Secondary | ICD-10-CM | POA: Diagnosis not present

## 2023-01-04 DIAGNOSIS — R52 Pain, unspecified: Secondary | ICD-10-CM | POA: Diagnosis not present

## 2023-01-04 DIAGNOSIS — N2581 Secondary hyperparathyroidism of renal origin: Secondary | ICD-10-CM | POA: Diagnosis not present

## 2023-01-04 DIAGNOSIS — Z992 Dependence on renal dialysis: Secondary | ICD-10-CM | POA: Diagnosis not present

## 2023-01-04 DIAGNOSIS — E1129 Type 2 diabetes mellitus with other diabetic kidney complication: Secondary | ICD-10-CM | POA: Diagnosis not present

## 2023-01-04 DIAGNOSIS — N186 End stage renal disease: Secondary | ICD-10-CM | POA: Diagnosis not present

## 2023-01-07 DIAGNOSIS — E1129 Type 2 diabetes mellitus with other diabetic kidney complication: Secondary | ICD-10-CM | POA: Diagnosis not present

## 2023-01-07 DIAGNOSIS — Z992 Dependence on renal dialysis: Secondary | ICD-10-CM | POA: Diagnosis not present

## 2023-01-07 DIAGNOSIS — N186 End stage renal disease: Secondary | ICD-10-CM | POA: Diagnosis not present

## 2023-01-07 DIAGNOSIS — R197 Diarrhea, unspecified: Secondary | ICD-10-CM | POA: Diagnosis not present

## 2023-01-07 DIAGNOSIS — N2581 Secondary hyperparathyroidism of renal origin: Secondary | ICD-10-CM | POA: Diagnosis not present

## 2023-01-07 DIAGNOSIS — R52 Pain, unspecified: Secondary | ICD-10-CM | POA: Diagnosis not present

## 2023-01-07 DIAGNOSIS — D631 Anemia in chronic kidney disease: Secondary | ICD-10-CM | POA: Diagnosis not present

## 2023-01-07 DIAGNOSIS — L299 Pruritus, unspecified: Secondary | ICD-10-CM | POA: Diagnosis not present

## 2023-01-08 DIAGNOSIS — E113593 Type 2 diabetes mellitus with proliferative diabetic retinopathy without macular edema, bilateral: Secondary | ICD-10-CM | POA: Diagnosis not present

## 2023-01-08 DIAGNOSIS — H25812 Combined forms of age-related cataract, left eye: Secondary | ICD-10-CM | POA: Diagnosis not present

## 2023-01-08 DIAGNOSIS — H35373 Puckering of macula, bilateral: Secondary | ICD-10-CM | POA: Diagnosis not present

## 2023-01-08 DIAGNOSIS — H40053 Ocular hypertension, bilateral: Secondary | ICD-10-CM | POA: Diagnosis not present

## 2023-01-09 DIAGNOSIS — R52 Pain, unspecified: Secondary | ICD-10-CM | POA: Diagnosis not present

## 2023-01-09 DIAGNOSIS — Z992 Dependence on renal dialysis: Secondary | ICD-10-CM | POA: Diagnosis not present

## 2023-01-09 DIAGNOSIS — L299 Pruritus, unspecified: Secondary | ICD-10-CM | POA: Diagnosis not present

## 2023-01-09 DIAGNOSIS — E1129 Type 2 diabetes mellitus with other diabetic kidney complication: Secondary | ICD-10-CM | POA: Diagnosis not present

## 2023-01-09 DIAGNOSIS — N186 End stage renal disease: Secondary | ICD-10-CM | POA: Diagnosis not present

## 2023-01-09 DIAGNOSIS — D631 Anemia in chronic kidney disease: Secondary | ICD-10-CM | POA: Diagnosis not present

## 2023-01-09 DIAGNOSIS — N2581 Secondary hyperparathyroidism of renal origin: Secondary | ICD-10-CM | POA: Diagnosis not present

## 2023-01-09 DIAGNOSIS — R197 Diarrhea, unspecified: Secondary | ICD-10-CM | POA: Diagnosis not present

## 2023-01-11 DIAGNOSIS — L299 Pruritus, unspecified: Secondary | ICD-10-CM | POA: Diagnosis not present

## 2023-01-11 DIAGNOSIS — R52 Pain, unspecified: Secondary | ICD-10-CM | POA: Diagnosis not present

## 2023-01-11 DIAGNOSIS — N2581 Secondary hyperparathyroidism of renal origin: Secondary | ICD-10-CM | POA: Diagnosis not present

## 2023-01-11 DIAGNOSIS — R197 Diarrhea, unspecified: Secondary | ICD-10-CM | POA: Diagnosis not present

## 2023-01-11 DIAGNOSIS — E1129 Type 2 diabetes mellitus with other diabetic kidney complication: Secondary | ICD-10-CM | POA: Diagnosis not present

## 2023-01-11 DIAGNOSIS — Z992 Dependence on renal dialysis: Secondary | ICD-10-CM | POA: Diagnosis not present

## 2023-01-11 DIAGNOSIS — N186 End stage renal disease: Secondary | ICD-10-CM | POA: Diagnosis not present

## 2023-01-11 DIAGNOSIS — D631 Anemia in chronic kidney disease: Secondary | ICD-10-CM | POA: Diagnosis not present

## 2023-01-14 DIAGNOSIS — Z992 Dependence on renal dialysis: Secondary | ICD-10-CM | POA: Diagnosis not present

## 2023-01-14 DIAGNOSIS — L299 Pruritus, unspecified: Secondary | ICD-10-CM | POA: Diagnosis not present

## 2023-01-14 DIAGNOSIS — D631 Anemia in chronic kidney disease: Secondary | ICD-10-CM | POA: Diagnosis not present

## 2023-01-14 DIAGNOSIS — E1129 Type 2 diabetes mellitus with other diabetic kidney complication: Secondary | ICD-10-CM | POA: Diagnosis not present

## 2023-01-14 DIAGNOSIS — N2581 Secondary hyperparathyroidism of renal origin: Secondary | ICD-10-CM | POA: Diagnosis not present

## 2023-01-14 DIAGNOSIS — N186 End stage renal disease: Secondary | ICD-10-CM | POA: Diagnosis not present

## 2023-01-14 DIAGNOSIS — R52 Pain, unspecified: Secondary | ICD-10-CM | POA: Diagnosis not present

## 2023-01-14 DIAGNOSIS — R197 Diarrhea, unspecified: Secondary | ICD-10-CM | POA: Diagnosis not present

## 2023-01-16 DIAGNOSIS — R197 Diarrhea, unspecified: Secondary | ICD-10-CM | POA: Diagnosis not present

## 2023-01-16 DIAGNOSIS — L299 Pruritus, unspecified: Secondary | ICD-10-CM | POA: Diagnosis not present

## 2023-01-16 DIAGNOSIS — N2581 Secondary hyperparathyroidism of renal origin: Secondary | ICD-10-CM | POA: Diagnosis not present

## 2023-01-16 DIAGNOSIS — R52 Pain, unspecified: Secondary | ICD-10-CM | POA: Diagnosis not present

## 2023-01-16 DIAGNOSIS — N186 End stage renal disease: Secondary | ICD-10-CM | POA: Diagnosis not present

## 2023-01-16 DIAGNOSIS — D631 Anemia in chronic kidney disease: Secondary | ICD-10-CM | POA: Diagnosis not present

## 2023-01-16 DIAGNOSIS — E1129 Type 2 diabetes mellitus with other diabetic kidney complication: Secondary | ICD-10-CM | POA: Diagnosis not present

## 2023-01-16 DIAGNOSIS — Z992 Dependence on renal dialysis: Secondary | ICD-10-CM | POA: Diagnosis not present

## 2023-01-16 NOTE — Telephone Encounter (Signed)
Filled via phone req MS

## 2023-01-17 DIAGNOSIS — H25811 Combined forms of age-related cataract, right eye: Secondary | ICD-10-CM | POA: Diagnosis not present

## 2023-01-18 DIAGNOSIS — R52 Pain, unspecified: Secondary | ICD-10-CM | POA: Diagnosis not present

## 2023-01-18 DIAGNOSIS — N186 End stage renal disease: Secondary | ICD-10-CM | POA: Diagnosis not present

## 2023-01-18 DIAGNOSIS — N2581 Secondary hyperparathyroidism of renal origin: Secondary | ICD-10-CM | POA: Diagnosis not present

## 2023-01-18 DIAGNOSIS — R197 Diarrhea, unspecified: Secondary | ICD-10-CM | POA: Diagnosis not present

## 2023-01-18 DIAGNOSIS — D631 Anemia in chronic kidney disease: Secondary | ICD-10-CM | POA: Diagnosis not present

## 2023-01-18 DIAGNOSIS — Z992 Dependence on renal dialysis: Secondary | ICD-10-CM | POA: Diagnosis not present

## 2023-01-18 DIAGNOSIS — L299 Pruritus, unspecified: Secondary | ICD-10-CM | POA: Diagnosis not present

## 2023-01-18 DIAGNOSIS — E1129 Type 2 diabetes mellitus with other diabetic kidney complication: Secondary | ICD-10-CM | POA: Diagnosis not present

## 2023-01-21 DIAGNOSIS — Z992 Dependence on renal dialysis: Secondary | ICD-10-CM | POA: Diagnosis not present

## 2023-01-21 DIAGNOSIS — N186 End stage renal disease: Secondary | ICD-10-CM | POA: Diagnosis not present

## 2023-01-21 DIAGNOSIS — D631 Anemia in chronic kidney disease: Secondary | ICD-10-CM | POA: Diagnosis not present

## 2023-01-21 DIAGNOSIS — E1129 Type 2 diabetes mellitus with other diabetic kidney complication: Secondary | ICD-10-CM | POA: Diagnosis not present

## 2023-01-21 DIAGNOSIS — L299 Pruritus, unspecified: Secondary | ICD-10-CM | POA: Diagnosis not present

## 2023-01-21 DIAGNOSIS — R197 Diarrhea, unspecified: Secondary | ICD-10-CM | POA: Diagnosis not present

## 2023-01-21 DIAGNOSIS — N2581 Secondary hyperparathyroidism of renal origin: Secondary | ICD-10-CM | POA: Diagnosis not present

## 2023-01-21 DIAGNOSIS — R52 Pain, unspecified: Secondary | ICD-10-CM | POA: Diagnosis not present

## 2023-01-23 DIAGNOSIS — N186 End stage renal disease: Secondary | ICD-10-CM | POA: Diagnosis not present

## 2023-01-23 DIAGNOSIS — N2581 Secondary hyperparathyroidism of renal origin: Secondary | ICD-10-CM | POA: Diagnosis not present

## 2023-01-23 DIAGNOSIS — E1122 Type 2 diabetes mellitus with diabetic chronic kidney disease: Secondary | ICD-10-CM | POA: Diagnosis not present

## 2023-01-23 DIAGNOSIS — L299 Pruritus, unspecified: Secondary | ICD-10-CM | POA: Diagnosis not present

## 2023-01-23 DIAGNOSIS — D631 Anemia in chronic kidney disease: Secondary | ICD-10-CM | POA: Diagnosis not present

## 2023-01-23 DIAGNOSIS — R52 Pain, unspecified: Secondary | ICD-10-CM | POA: Diagnosis not present

## 2023-01-23 DIAGNOSIS — R197 Diarrhea, unspecified: Secondary | ICD-10-CM | POA: Diagnosis not present

## 2023-01-23 DIAGNOSIS — E1129 Type 2 diabetes mellitus with other diabetic kidney complication: Secondary | ICD-10-CM | POA: Diagnosis not present

## 2023-01-23 DIAGNOSIS — Z992 Dependence on renal dialysis: Secondary | ICD-10-CM | POA: Diagnosis not present

## 2023-01-25 DIAGNOSIS — E1129 Type 2 diabetes mellitus with other diabetic kidney complication: Secondary | ICD-10-CM | POA: Diagnosis not present

## 2023-01-25 DIAGNOSIS — L299 Pruritus, unspecified: Secondary | ICD-10-CM | POA: Diagnosis not present

## 2023-01-25 DIAGNOSIS — R52 Pain, unspecified: Secondary | ICD-10-CM | POA: Diagnosis not present

## 2023-01-25 DIAGNOSIS — D509 Iron deficiency anemia, unspecified: Secondary | ICD-10-CM | POA: Diagnosis not present

## 2023-01-25 DIAGNOSIS — D631 Anemia in chronic kidney disease: Secondary | ICD-10-CM | POA: Diagnosis not present

## 2023-01-25 DIAGNOSIS — N2581 Secondary hyperparathyroidism of renal origin: Secondary | ICD-10-CM | POA: Diagnosis not present

## 2023-01-25 DIAGNOSIS — Z992 Dependence on renal dialysis: Secondary | ICD-10-CM | POA: Diagnosis not present

## 2023-01-25 DIAGNOSIS — R197 Diarrhea, unspecified: Secondary | ICD-10-CM | POA: Diagnosis not present

## 2023-01-25 DIAGNOSIS — N186 End stage renal disease: Secondary | ICD-10-CM | POA: Diagnosis not present

## 2023-01-28 DIAGNOSIS — N2581 Secondary hyperparathyroidism of renal origin: Secondary | ICD-10-CM | POA: Diagnosis not present

## 2023-01-28 DIAGNOSIS — L299 Pruritus, unspecified: Secondary | ICD-10-CM | POA: Diagnosis not present

## 2023-01-28 DIAGNOSIS — Z992 Dependence on renal dialysis: Secondary | ICD-10-CM | POA: Diagnosis not present

## 2023-01-28 DIAGNOSIS — N186 End stage renal disease: Secondary | ICD-10-CM | POA: Diagnosis not present

## 2023-01-28 DIAGNOSIS — E1129 Type 2 diabetes mellitus with other diabetic kidney complication: Secondary | ICD-10-CM | POA: Diagnosis not present

## 2023-01-28 DIAGNOSIS — R197 Diarrhea, unspecified: Secondary | ICD-10-CM | POA: Diagnosis not present

## 2023-01-28 DIAGNOSIS — D631 Anemia in chronic kidney disease: Secondary | ICD-10-CM | POA: Diagnosis not present

## 2023-01-28 DIAGNOSIS — D509 Iron deficiency anemia, unspecified: Secondary | ICD-10-CM | POA: Diagnosis not present

## 2023-01-28 DIAGNOSIS — R52 Pain, unspecified: Secondary | ICD-10-CM | POA: Diagnosis not present

## 2023-01-30 DIAGNOSIS — R52 Pain, unspecified: Secondary | ICD-10-CM | POA: Diagnosis not present

## 2023-01-30 DIAGNOSIS — Z992 Dependence on renal dialysis: Secondary | ICD-10-CM | POA: Diagnosis not present

## 2023-01-30 DIAGNOSIS — L299 Pruritus, unspecified: Secondary | ICD-10-CM | POA: Diagnosis not present

## 2023-01-30 DIAGNOSIS — N2581 Secondary hyperparathyroidism of renal origin: Secondary | ICD-10-CM | POA: Diagnosis not present

## 2023-01-30 DIAGNOSIS — N186 End stage renal disease: Secondary | ICD-10-CM | POA: Diagnosis not present

## 2023-01-30 DIAGNOSIS — D509 Iron deficiency anemia, unspecified: Secondary | ICD-10-CM | POA: Diagnosis not present

## 2023-01-30 DIAGNOSIS — E1129 Type 2 diabetes mellitus with other diabetic kidney complication: Secondary | ICD-10-CM | POA: Diagnosis not present

## 2023-01-30 DIAGNOSIS — R197 Diarrhea, unspecified: Secondary | ICD-10-CM | POA: Diagnosis not present

## 2023-01-30 DIAGNOSIS — D631 Anemia in chronic kidney disease: Secondary | ICD-10-CM | POA: Diagnosis not present

## 2023-02-01 DIAGNOSIS — D509 Iron deficiency anemia, unspecified: Secondary | ICD-10-CM | POA: Diagnosis not present

## 2023-02-01 DIAGNOSIS — D631 Anemia in chronic kidney disease: Secondary | ICD-10-CM | POA: Diagnosis not present

## 2023-02-01 DIAGNOSIS — R197 Diarrhea, unspecified: Secondary | ICD-10-CM | POA: Diagnosis not present

## 2023-02-01 DIAGNOSIS — N2581 Secondary hyperparathyroidism of renal origin: Secondary | ICD-10-CM | POA: Diagnosis not present

## 2023-02-01 DIAGNOSIS — E1129 Type 2 diabetes mellitus with other diabetic kidney complication: Secondary | ICD-10-CM | POA: Diagnosis not present

## 2023-02-01 DIAGNOSIS — R52 Pain, unspecified: Secondary | ICD-10-CM | POA: Diagnosis not present

## 2023-02-01 DIAGNOSIS — L299 Pruritus, unspecified: Secondary | ICD-10-CM | POA: Diagnosis not present

## 2023-02-01 DIAGNOSIS — Z992 Dependence on renal dialysis: Secondary | ICD-10-CM | POA: Diagnosis not present

## 2023-02-01 DIAGNOSIS — N186 End stage renal disease: Secondary | ICD-10-CM | POA: Diagnosis not present

## 2023-02-04 DIAGNOSIS — D509 Iron deficiency anemia, unspecified: Secondary | ICD-10-CM | POA: Diagnosis not present

## 2023-02-04 DIAGNOSIS — N186 End stage renal disease: Secondary | ICD-10-CM | POA: Diagnosis not present

## 2023-02-04 DIAGNOSIS — L299 Pruritus, unspecified: Secondary | ICD-10-CM | POA: Diagnosis not present

## 2023-02-04 DIAGNOSIS — R52 Pain, unspecified: Secondary | ICD-10-CM | POA: Diagnosis not present

## 2023-02-04 DIAGNOSIS — Z992 Dependence on renal dialysis: Secondary | ICD-10-CM | POA: Diagnosis not present

## 2023-02-04 DIAGNOSIS — N2581 Secondary hyperparathyroidism of renal origin: Secondary | ICD-10-CM | POA: Diagnosis not present

## 2023-02-04 DIAGNOSIS — R197 Diarrhea, unspecified: Secondary | ICD-10-CM | POA: Diagnosis not present

## 2023-02-04 DIAGNOSIS — D631 Anemia in chronic kidney disease: Secondary | ICD-10-CM | POA: Diagnosis not present

## 2023-02-04 DIAGNOSIS — E1129 Type 2 diabetes mellitus with other diabetic kidney complication: Secondary | ICD-10-CM | POA: Diagnosis not present

## 2023-02-04 NOTE — Progress Notes (Signed)
[facility] - Clinic Note  02/12/2023     CHIEF COMPLAINT Patient presents for Retina Follow Up  HISTORY OF PRESENT ILLNESS: Jonathan Mcneil is a 67 y.o. male who presents to the clinic today for:   HPI     Retina Follow Up   Patient presents with  Diabetic Retinopathy.  In both eyes.  This started 6 weeks ago.  I, the attending physician,  performed the HPI with the patient and updated documentation appropriately.        Comments   Patient here for 6 weeks retina follow up for PDR OU. Patient states vision doing better. Did have eye pain nasally. Used drops.       Last edited by Rennis Chris, MD on 02/12/2023 12:51 PM.    Pt states he had cataract sx OD with Dr. Dione Booze, left eye is scheduled for this Friday   Referring physician: Georgianne Fick, MD [address] [place],  [place] [address]  HISTORICAL INFORMATION:   Selected notes from the MEDICAL RECORD NUMBER Referred by Dr. Aletta Edouard for heme OD   CURRENT MEDICATIONS: No current outpatient medications on file. (Ophthalmic Drugs)   No current facility-administered medications for this visit. (Ophthalmic Drugs)   Current Outpatient Medications (Other)  Medication Sig   atorvastatin (LIPITOR) 20 MG tablet Take 20 mg by mouth daily.   calcitRIOL (ROCALTROL) 0.25 MCG capsule Take 5 capsules (1.25 mcg total) by mouth every Tuesday, Thursday, and Saturday at 6 PM.   ENTRESTO 24-26 MG Take 1 tablet by mouth 2 (two) times daily.   gabapentin (NEURONTIN) 300 MG capsule Take 300 mg by mouth at bedtime.   loperamide (IMODIUM) 2 MG capsule Take 1 capsule (2 mg total) by mouth 4 (four) times daily as needed for diarrhea or loose stools.   methocarbamol (ROBAXIN) 500 MG tablet Take 1 tablet (500 mg total) by mouth every 8 (eight) hours as needed for muscle spasms.   metoprolol succinate (TOPROL-XL) 25 MG 24 hr tablet Take 37.5 mg by mouth daily.   ondansetron (ZOFRAN-ODT) 4 MG  disintegrating tablet Take 1 tablet (4 mg total) by mouth every 8 (eight) hours as needed for nausea or vomiting.   pantoprazole (PROTONIX) 40 MG tablet Take 1 tablet (40 mg total) by mouth 2 (two) times daily before a meal. Twice a day x 4 weeks, then daily   sertraline (ZOLOFT) 25 MG tablet Take 25 mg by mouth 3 (three) times a week. M W F   No current facility-administered medications for this visit. (Other)   REVIEW OF SYSTEMS: ROS   Positive for: Gastrointestinal, Neurological, Genitourinary, Endocrine, Eyes Negative for: Constitutional, Skin, Musculoskeletal, HENT, Cardiovascular, Respiratory, Psychiatric, Allergic/Imm, Heme/Lymph Last edited by Laddie Aquas, COA on 02/12/2023  8:26 AM.      ALLERGIES Allergies  Allergen Reactions   Gabapentin Nausea Only   Ibuprofen Hives   Penicillins Itching and Other (See Comments)    Has patient had a PCN reaction causing immediate rash, facial/tongue/throat swelling, SOB or lightheadedness with hypotension: No Has patient had a PCN reaction causing severe rash involving mucus membranes or skin necrosis: No Has patient had a PCN reaction that required hospitalization No Has patient had a PCN reaction occurring within the last 10 years: Unknown If all of the above answers are "NO", then may proceed with Cephalosporin use.   Venlafaxine Other (See Comments)    insomnia   PAST MEDICAL HISTORY Past Medical History:  Diagnosis Date  Cataract    Diabetes mellitus without complication (HCC)    Diabetic retinopathy (HCC)    ESRD (end stage renal disease) (HCC)    GERD (gastroesophageal reflux disease)    PMH   Hypertension    Hypertensive retinopathy    Low back pain    Lung disease    Metabolic acidosis    Neuromuscular disorder (HCC)    peripheral neuropathy   Pancreatitis    Schizophrenia (HCC)    does not take medications   Past Surgical History:  Procedure Laterality Date   A/V FISTULAGRAM Left 06/03/2018   Procedure:  A/V FISTULAGRAM;  Surgeon: Cephus Shelling, MD;  Location: [facility] IN[facility]ASIVE [facility];  Service: Cardiovascular;  Laterality: Left;   AV FISTULA PLACEMENT Left 02/11/2018   Procedure: INSERTION OF ARTERIOVENOUS (AV) FISTULA LEFT  ARM;  Surgeon: Maeola Harman, MD;  Location[facility] OR;  Service: Vascular;  Laterality: Left;   BASCILIC VEIN TRANSPOSITION Left 04/17/2018   Procedure: BASILIC VEIN TRANSPOSITION SECOND STAGE;  Surgeon: Maeola Harman, MD;  Location: [facility] OR;  Service: Vascular;  Laterality: Left;   BIOPSY  10/20/2019   Procedure: BIOPSY;  Surgeon: Vida Rigger, MD;  Location: [facility] ENDOSCOPY;  Service: Endoscopy;;   COLONOSCOPY     DENTAL SURGERY     ESOPHAGOGASTRODUODENOSCOPY N/A 06/16/2021   Procedure: ESOPHAGOGASTRODUODENOSCOPY (EGD);  Surgeon: Charlott Rakes, MD;  Location: [facility] ENDOSCOPY;  Service: Gastroenterology;  Laterality: N/A;   ESOPHAGOGASTRODUODENOSCOPY (EGD) WITH PROPOFOL N/A 10/20/2019   Procedure: ESOPHAGOGASTRODUODENOSCOPY (EGD) WITH PROPOFOL;  Surgeon: Vida Rigger, MD;  Location: [facility] ENDOSCOPY;  Service: Endoscopy;  Laterality: N/A;   INSERTION OF DIALYSIS CATHETER Right 02/25/2018   Procedure: INSERTION OF DIALYSIS CATHETER;  Surgeon: Maeola Harman, MD;  Location: [facility] OR;  Service: Vascular;  Laterality: Right;   PERIPHERAL VASCULAR BALLOON ANGIOPLASTY  06/03/2018   Procedure: PERIPHERAL VASCULAR BALLOON ANGIOPLASTY;  Surgeon: Cephus Shelling, MD;  Location: [facility] INVASIVE CV LAB;  Service: Cardiovascular;;  left a/v fistula   FAMILY HISTORY Family History  Problem Relation Age of Onset   Kidney failure Mother    Diabetes Father    Blindness Brother    SOCIAL HISTORY Social History   Tobacco Use   Smoking status: Former   Smokeless tobacco: Never  Advertising account planner   Vaping status: Never Used  Substance Use Topics   Alcohol use: No   Drug use: Not Currently    Types: Cocaine, Marijuana    Comment: smoked marijuana a few days ago; he  denies using cocaine       OPHTHALMIC EXAM:  Base Eye Exam     Visual Acuity (Snellen - Linear)       Right Left   Dist Reading 20/25 -2 20/20 -2   Dist ph Greer 20/20 -2          Tonometry (Tonopen, 8:22 AM)       Right Left   Pressure 13 13         Pupils       Dark Light Shape React APD   Right 2 2 Round Minimal None   Left 2 2 Round Minimal None         Visual Fields (Counting fingers)       Left Right    Full Full         Extraocular Movement       Right Left    Full, Ortho Full, Ortho         Neuro/Psych  Oriented x3: Yes   Mood/Affect: Normal         Dilation     Both eyes: 1.0% Mydriacyl, 2.5% Phenylephrine @ 8:22 AM           Slit Lamp and Fundus Exam     Slit Lamp Exam       Right Left   Lids/Lashes Dermatochalasis - upper lid Dermatochalasis - upper lid   Conjunctiva/Sclera Melanosis Melanosis, temporal pinguecula   Cornea Mild arcus, well healed cataract wound arcus   Anterior Chamber deep and clear, no cell deep and clear   Iris Round and dilated, No NVI Round and dilated, No NVI   Lens PC IOL in good position, trace Posterior capsular opacification 3+ Nuclear sclerosis, 3+ Cortical cataract   Anterior Vitreous Vitreous syneresis, old white blood clots settled inferiorly, no heme centrally Vitreous syneresis, old white blood clots settled inferiorly -- minimal, no heme centrally, Posterior vitreous detachment         Fundus Exam       Right Left   Disc Mild Pallor, Sharp rim, focal PPP/PPA Mild Pallor, Sharp rim   C/D Ratio 0.5 0.5   Macula Flat, Good foveal reflex, RPE mottling and clumping, trace cystic changes temporal macula -- improved, rare MA, +ERM Flat, good foveal reflex, ERM, RPE mottling and clumping, rare MA, no edema   Vessels attenuated, Tortuous attenuated, Tortuous   Periphery Attached, 360 IRH/DBH - improved, pre-retinal heme settled inferiorly, 360 PRP with room for fill in Attached, scattered IRH/DBH  greatest nasally, good 360 PRP changes with room for fill in, No RT/RD           IMAGING AND PROCEDURES  Imaging and Procedures for 02/12/2023  OCT, Retina - OU - Both Eyes       Right Eye Quality was good. Central Foveal Thickness: 280. Progression has improved. Findings include normal foveal contour, no IRF, no SRF, intraretinal hyper-reflective material, epiretinal membrane (Mild interval improvement in vitreous opacities, stable improvement in cystic changes temporal macula, mild ERM).   Left Eye Quality was good. Central Foveal Thickness: 318. Progression has been stable. Findings include no SRF, abnormal foveal contour, intraretinal hyper-reflective material, epiretinal membrane, intraretinal fluid (ERM with blunted foveal contour, trace cystic changes temporal macula ).   Notes *Images captured and stored on drive  Diagnosis / Impression:  OD: Mild interval improvement in vitreous opacities, cystic changes temporal macula -- improved, mild ERM OS: ERM with blunted foveal contour, trace cystic changes temporal macula -- improved  Clinical management:  See below  Abbreviations: NFP - Normal foveal profile. CME - cystoid macular edema. PED - pigment epithelial detachment. IRF - intraretinal fluid. SRF - subretinal fluid. EZ - ellipsoid zone. ERM - epiretinal membrane. ORA - outer retinal atrophy. ORT - outer retinal tubulation. SRHM - subretinal hyper-reflective material. IRHM - intraretinal hyper-reflective material      Intravitreal Injection, Pharmacologic Agent - OD - Right Eye       Time Out 02/12/2023. 9:14 AM. Confirmed correct patient, procedure, site, and patient consented.   Anesthesia Topical anesthesia was used. Anesthetic medications included Lidocaine 2%, Proparacaine 0.5%.   Procedure Preparation included 5% betadine to ocular surface, eyelid speculum. A (32g) needle was used.   Injection: 1.25 mg Bevacizumab 1.25mg /0.86ml   Route: Intravitreal,  Site: Right Eye   NDC: [id number], Lot: [id number], Expiration date: 06/09/2023   Post-op Post injection exam found visual acuity of at least counting fingers. The patient tolerated the procedure well. There were  no complications. The patient received written and verbal post procedure care education. Post injection medications were not given.            ASSESSMENT/PLAN:   ICD-10-CM   1. 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 Vitreous hemorrhage of left eye (HCC)  H43.12     3. Vitreous hemorrhage, right eye (HCC)  H43.11     4. Posterior vitreous detachment of left eye  H43.812     5. Essential hypertension  I10     6. Hypertensive retinopathy of both eyes  H35.033     7. Epiretinal membrane (ERM) of left eye  H35.372     8. Combined forms of age-related cataract of left eye  H25.812     9. Pseudophakia  Z96.1     10. Bilateral ocular hypertension  H40.053      1-3. Proliferative diabetic retinopathy w/ recurrent VH OU (OD > OS)  - A1c: 5.9 on 03.13.24 - s/p IVA OD #1 (12.08.21), #2 (01.11.22), #3 (02.09.22), #4 (11.04.22), #5 (12.02.22), #6 (12.30.22), #7 (01.27.23), #8 (01.05.24), #9 (02.02.24), #10 (08.28.24), #11 (10.09.24) - s/p IVA OS #1 (12.10.21), #2 (01.11.22), #3 (02.09.22), #4 (09.09.22), #5 (10.07.22), #6 (11.04.22), #7 (12.02.22), #8 (12.30.22) - FA (12.08.21) shows late-leaking MA, vascular nonperfusion, +NV OU (nasal midzone) - s/p PRP OD (01.26.22) - s/p PRP OS (03.09.22) - repeat FA 6.6.22 shows interval improvement in NVE/leakage OU s/p PRP OU-- just minimal leakage remains - repeat FA 10.09.24 shows OD: NV stably regressed, mild leaking MA; OS: Interval improvement in focal NVE IN periphery -- no leakage; mild perifoveal leakage -- both  improved from prior - BCVA OD improved to 20/20 from 20/25, OS stable at 20/20  - OCT shows OD: Mild interval improvement in vitreous opacities, cystic changes temporal macula -- improved, mild ERM; OS: ERM with blunted foveal contour, trace cystic changes temporal macula -- improved at 6 weeks  - recommend IVA OD #12 today, 11.20.24 with follow up extended to 8 weeks   - pt wishes to proceed with injection  - RBA of procedure discussed, questions answered - IVA informed consent obtained and signed, 01.05.24 (OD) - see procedure note  - f/u 8 weeks, DFE, OCT, possible injections  4. Hemorrhagic PVD OS  - Onset mid-August 2022 w/ new onset floaters, no photopsias  - s/p IVA OS #4 (09.09.22), #5 (10.07.22), #6 (11.04.22), #7 (12.02.22), #8 (12.30.22) - today, VA stably improved to 20/20 OS from 20/40 and 20/80 prior - pt states they have been holding heparin at dialysis - exam shows interval improvement in vitreous heme -- clearing and settling inferiorly - pt reports previously receiving heparin during dialysis (T, Th, Sat) -- may have contributed to interval worsening   - Discussed findings and prognosis  - No RT or RD on 360 peripheral exam  - Reviewed s/s of RT/RD  - Strict return precautions for any such RT/RD signs/symptoms  - VH precautions reviewed -- minimize activities, keep head elevated, hold heparin if able, avoid ASA/NSAIDS  5,6. Hypertensive retinopathy OU - discussed importance of tight BP control - monitor  7. Epiretinal membrane, left eye  - mild ERM - BCVA 20/20 - asymptomatic, no metamorphopsia - no indication for surgery at this time -  monitor for now  8. Mixed Cataract OS - The symptoms of cataract, surgical options, and treatments and risks were discussed with patient - discussed diagnosis and progression - left eye surgery scheduled for Friday, November 22  9. Pseudophakia OD  - s/p CE/IOL (Dr. Dione Booze)  - IOL in good position, doing well  -  monitor  10. Ocular Hypertension OU  - IOP today: 15,14  - cont cosopt bid OU    Ophthalmic Meds Ordered this visit:  Meds ordered this encounter  Medications   Bevacizumab (AVASTIN) SOLN 1.25 mg     Return in about 8 weeks (around 04/09/2023) for f/u PDR OU, DFE, OCT.  There are no Patient Instructions on file for this visit.  This document serves as a record of services personally performed by Karie Chimera, MD, PhD. It was created on their behalf by De Blanch, an ophthalmic technician. The creation of this record is the provider's dictation and/or activities during the visit.    Electronically signed by: De Blanch, OA, 02/15/23  1:38 AM  This document serves as a record of services personally performed by Karie Chimera, MD, PhD. It was created on their behalf by Glee Arvin. Manson Passey, OA an ophthalmic technician. The creation of this record is the provider's dictation and/or activities during the visit.    Electronically signed by: Glee Arvin. Manson Passey, OA 02/15/23 1:38 AM  Karie Chimera, M.D., Ph.D. Diseases & Surgery of the Retina and Vitreous [facility] 02/12/2023   I have reviewed the above documentation for accuracy and completeness, and I agree with the above. Karie Chimera, M.D., Ph.D. 02/15/23 1:42 AM   Abbreviations: M myopia (nearsighted); A astigmatism; H hyperopia (farsighted); P presbyopia; Mrx spectacle prescription;  CTL contact lenses; OD right eye; OS left eye; OU both eyes  XT exotropia; ET esotropia; PEK punctate epithelial keratitis; PEE punctate epithelial erosions; DES dry eye syndrome; MGD meibomian gland dysfunction; ATs artificial tears; PFAT's preservative free artificial tears; NSC nuclear sclerotic cataract; PSC posterior subcapsular cataract; ERM epi-retinal membrane; PVD posterior vitreous detachment; RD retinal detachment; DM diabetes mellitus; DR diabetic retinopathy; NPDR non-proliferative diabetic retinopathy; PDR  proliferative diabetic retinopathy; CSME clinically significant macular edema; DME diabetic macular edema; dbh dot blot hemorrhages; CWS cotton wool spot; POAG primary open angle glaucoma; C/D cup-to-disc ratio; HVF humphrey visual field; GVF goldmann visual field; OCT optical coherence tomography; IOP intraocular pressure; BRVO Branch retinal vein occlusion; CRVO central retinal vein occlusion; CRAO central retinal artery occlusion; BRAO branch retinal artery occlusion; RT retinal tear; SB scleral buckle; PPV pars plana vitrectomy; VH Vitreous hemorrhage; PRP panretinal laser photocoagulation; IVK intravitreal kenalog; VMT vitreomacular traction; MH Macular hole;  NVD neovascularization of the disc; NVE neovascularization elsewhere; AREDS age related eye disease study; ARMD age related macular degeneration; POAG primary open angle glaucoma; EBMD epithelial/anterior basement membrane dystrophy; ACIOL anterior chamber intraocular lens; IOL intraocular lens; PCIOL posterior chamber intraocular lens; Phaco/IOL phacoemulsification with intraocular lens placement; PRK photorefractive keratectomy; LASIK laser assisted in situ keratomileusis; HTN hypertension; DM diabetes mellitus; COPD chronic obstructive pulmonary disease

## 2023-02-06 DIAGNOSIS — L299 Pruritus, unspecified: Secondary | ICD-10-CM | POA: Diagnosis not present

## 2023-02-06 DIAGNOSIS — N2581 Secondary hyperparathyroidism of renal origin: Secondary | ICD-10-CM | POA: Diagnosis not present

## 2023-02-06 DIAGNOSIS — R197 Diarrhea, unspecified: Secondary | ICD-10-CM | POA: Diagnosis not present

## 2023-02-06 DIAGNOSIS — D631 Anemia in chronic kidney disease: Secondary | ICD-10-CM | POA: Diagnosis not present

## 2023-02-06 DIAGNOSIS — N186 End stage renal disease: Secondary | ICD-10-CM | POA: Diagnosis not present

## 2023-02-06 DIAGNOSIS — D509 Iron deficiency anemia, unspecified: Secondary | ICD-10-CM | POA: Diagnosis not present

## 2023-02-06 DIAGNOSIS — R52 Pain, unspecified: Secondary | ICD-10-CM | POA: Diagnosis not present

## 2023-02-06 DIAGNOSIS — Z992 Dependence on renal dialysis: Secondary | ICD-10-CM | POA: Diagnosis not present

## 2023-02-06 DIAGNOSIS — E1129 Type 2 diabetes mellitus with other diabetic kidney complication: Secondary | ICD-10-CM | POA: Diagnosis not present

## 2023-02-08 DIAGNOSIS — R197 Diarrhea, unspecified: Secondary | ICD-10-CM | POA: Diagnosis not present

## 2023-02-08 DIAGNOSIS — Z992 Dependence on renal dialysis: Secondary | ICD-10-CM | POA: Diagnosis not present

## 2023-02-08 DIAGNOSIS — N2581 Secondary hyperparathyroidism of renal origin: Secondary | ICD-10-CM | POA: Diagnosis not present

## 2023-02-08 DIAGNOSIS — N186 End stage renal disease: Secondary | ICD-10-CM | POA: Diagnosis not present

## 2023-02-08 DIAGNOSIS — R52 Pain, unspecified: Secondary | ICD-10-CM | POA: Diagnosis not present

## 2023-02-08 DIAGNOSIS — D509 Iron deficiency anemia, unspecified: Secondary | ICD-10-CM | POA: Diagnosis not present

## 2023-02-08 DIAGNOSIS — D631 Anemia in chronic kidney disease: Secondary | ICD-10-CM | POA: Diagnosis not present

## 2023-02-08 DIAGNOSIS — E1129 Type 2 diabetes mellitus with other diabetic kidney complication: Secondary | ICD-10-CM | POA: Diagnosis not present

## 2023-02-08 DIAGNOSIS — L299 Pruritus, unspecified: Secondary | ICD-10-CM | POA: Diagnosis not present

## 2023-02-11 DIAGNOSIS — L299 Pruritus, unspecified: Secondary | ICD-10-CM | POA: Diagnosis not present

## 2023-02-11 DIAGNOSIS — Z992 Dependence on renal dialysis: Secondary | ICD-10-CM | POA: Diagnosis not present

## 2023-02-11 DIAGNOSIS — N2581 Secondary hyperparathyroidism of renal origin: Secondary | ICD-10-CM | POA: Diagnosis not present

## 2023-02-11 DIAGNOSIS — R197 Diarrhea, unspecified: Secondary | ICD-10-CM | POA: Diagnosis not present

## 2023-02-11 DIAGNOSIS — D509 Iron deficiency anemia, unspecified: Secondary | ICD-10-CM | POA: Diagnosis not present

## 2023-02-11 DIAGNOSIS — E1129 Type 2 diabetes mellitus with other diabetic kidney complication: Secondary | ICD-10-CM | POA: Diagnosis not present

## 2023-02-11 DIAGNOSIS — N186 End stage renal disease: Secondary | ICD-10-CM | POA: Diagnosis not present

## 2023-02-11 DIAGNOSIS — D631 Anemia in chronic kidney disease: Secondary | ICD-10-CM | POA: Diagnosis not present

## 2023-02-11 DIAGNOSIS — R52 Pain, unspecified: Secondary | ICD-10-CM | POA: Diagnosis not present

## 2023-02-12 ENCOUNTER — Encounter (INDEPENDENT_AMBULATORY_CARE_PROVIDER_SITE_OTHER): Payer: Self-pay | Admitting: Ophthalmology

## 2023-02-12 ENCOUNTER — Ambulatory Visit (INDEPENDENT_AMBULATORY_CARE_PROVIDER_SITE_OTHER): Payer: Medicare Other | Admitting: Ophthalmology

## 2023-02-12 DIAGNOSIS — H4312 Vitreous hemorrhage, left eye: Secondary | ICD-10-CM

## 2023-02-12 DIAGNOSIS — H40053 Ocular hypertension, bilateral: Secondary | ICD-10-CM

## 2023-02-12 DIAGNOSIS — Z961 Presence of intraocular lens: Secondary | ICD-10-CM

## 2023-02-12 DIAGNOSIS — H4313 Vitreous hemorrhage, bilateral: Secondary | ICD-10-CM

## 2023-02-12 DIAGNOSIS — H35033 Hypertensive retinopathy, bilateral: Secondary | ICD-10-CM | POA: Diagnosis not present

## 2023-02-12 DIAGNOSIS — I10 Essential (primary) hypertension: Secondary | ICD-10-CM | POA: Diagnosis not present

## 2023-02-12 DIAGNOSIS — H25812 Combined forms of age-related cataract, left eye: Secondary | ICD-10-CM | POA: Diagnosis not present

## 2023-02-12 DIAGNOSIS — H35372 Puckering of macula, left eye: Secondary | ICD-10-CM | POA: Diagnosis not present

## 2023-02-12 DIAGNOSIS — H43812 Vitreous degeneration, left eye: Secondary | ICD-10-CM | POA: Diagnosis not present

## 2023-02-12 DIAGNOSIS — H25813 Combined forms of age-related cataract, bilateral: Secondary | ICD-10-CM

## 2023-02-12 DIAGNOSIS — H4311 Vitreous hemorrhage, right eye: Secondary | ICD-10-CM

## 2023-02-12 DIAGNOSIS — E113513 Type 2 diabetes mellitus with proliferative diabetic retinopathy with macular edema, bilateral: Secondary | ICD-10-CM

## 2023-02-12 MED ORDER — BEVACIZUMAB CHEMO INJECTION 1.25MG/0.05ML SYRINGE FOR KALEIDOSCOPE
1.2500 mg | INTRAVITREAL | Status: AC | PRN
Start: 2023-02-12 — End: 2023-02-12
  Administered 2023-02-12: 1.25 mg via INTRAVITREAL

## 2023-02-13 DIAGNOSIS — L299 Pruritus, unspecified: Secondary | ICD-10-CM | POA: Diagnosis not present

## 2023-02-13 DIAGNOSIS — R52 Pain, unspecified: Secondary | ICD-10-CM | POA: Diagnosis not present

## 2023-02-13 DIAGNOSIS — E1129 Type 2 diabetes mellitus with other diabetic kidney complication: Secondary | ICD-10-CM | POA: Diagnosis not present

## 2023-02-13 DIAGNOSIS — D509 Iron deficiency anemia, unspecified: Secondary | ICD-10-CM | POA: Diagnosis not present

## 2023-02-13 DIAGNOSIS — N186 End stage renal disease: Secondary | ICD-10-CM | POA: Diagnosis not present

## 2023-02-13 DIAGNOSIS — Z992 Dependence on renal dialysis: Secondary | ICD-10-CM | POA: Diagnosis not present

## 2023-02-13 DIAGNOSIS — N2581 Secondary hyperparathyroidism of renal origin: Secondary | ICD-10-CM | POA: Diagnosis not present

## 2023-02-13 DIAGNOSIS — R197 Diarrhea, unspecified: Secondary | ICD-10-CM | POA: Diagnosis not present

## 2023-02-13 DIAGNOSIS — D631 Anemia in chronic kidney disease: Secondary | ICD-10-CM | POA: Diagnosis not present

## 2023-02-14 DIAGNOSIS — H25812 Combined forms of age-related cataract, left eye: Secondary | ICD-10-CM | POA: Diagnosis not present

## 2023-02-15 DIAGNOSIS — E1129 Type 2 diabetes mellitus with other diabetic kidney complication: Secondary | ICD-10-CM | POA: Diagnosis not present

## 2023-02-15 DIAGNOSIS — L299 Pruritus, unspecified: Secondary | ICD-10-CM | POA: Diagnosis not present

## 2023-02-15 DIAGNOSIS — N2581 Secondary hyperparathyroidism of renal origin: Secondary | ICD-10-CM | POA: Diagnosis not present

## 2023-02-15 DIAGNOSIS — N186 End stage renal disease: Secondary | ICD-10-CM | POA: Diagnosis not present

## 2023-02-15 DIAGNOSIS — R52 Pain, unspecified: Secondary | ICD-10-CM | POA: Diagnosis not present

## 2023-02-15 DIAGNOSIS — R197 Diarrhea, unspecified: Secondary | ICD-10-CM | POA: Diagnosis not present

## 2023-02-15 DIAGNOSIS — Z992 Dependence on renal dialysis: Secondary | ICD-10-CM | POA: Diagnosis not present

## 2023-02-15 DIAGNOSIS — D509 Iron deficiency anemia, unspecified: Secondary | ICD-10-CM | POA: Diagnosis not present

## 2023-02-15 DIAGNOSIS — D631 Anemia in chronic kidney disease: Secondary | ICD-10-CM | POA: Diagnosis not present

## 2023-02-17 DIAGNOSIS — D631 Anemia in chronic kidney disease: Secondary | ICD-10-CM | POA: Diagnosis not present

## 2023-02-17 DIAGNOSIS — D509 Iron deficiency anemia, unspecified: Secondary | ICD-10-CM | POA: Diagnosis not present

## 2023-02-17 DIAGNOSIS — L299 Pruritus, unspecified: Secondary | ICD-10-CM | POA: Diagnosis not present

## 2023-02-17 DIAGNOSIS — R52 Pain, unspecified: Secondary | ICD-10-CM | POA: Diagnosis not present

## 2023-02-17 DIAGNOSIS — Z992 Dependence on renal dialysis: Secondary | ICD-10-CM | POA: Diagnosis not present

## 2023-02-17 DIAGNOSIS — N2581 Secondary hyperparathyroidism of renal origin: Secondary | ICD-10-CM | POA: Diagnosis not present

## 2023-02-17 DIAGNOSIS — N186 End stage renal disease: Secondary | ICD-10-CM | POA: Diagnosis not present

## 2023-02-17 DIAGNOSIS — R197 Diarrhea, unspecified: Secondary | ICD-10-CM | POA: Diagnosis not present

## 2023-02-17 DIAGNOSIS — E1129 Type 2 diabetes mellitus with other diabetic kidney complication: Secondary | ICD-10-CM | POA: Diagnosis not present

## 2023-02-19 DIAGNOSIS — Z992 Dependence on renal dialysis: Secondary | ICD-10-CM | POA: Diagnosis not present

## 2023-02-19 DIAGNOSIS — R197 Diarrhea, unspecified: Secondary | ICD-10-CM | POA: Diagnosis not present

## 2023-02-19 DIAGNOSIS — L299 Pruritus, unspecified: Secondary | ICD-10-CM | POA: Diagnosis not present

## 2023-02-19 DIAGNOSIS — R52 Pain, unspecified: Secondary | ICD-10-CM | POA: Diagnosis not present

## 2023-02-19 DIAGNOSIS — D631 Anemia in chronic kidney disease: Secondary | ICD-10-CM | POA: Diagnosis not present

## 2023-02-19 DIAGNOSIS — E1129 Type 2 diabetes mellitus with other diabetic kidney complication: Secondary | ICD-10-CM | POA: Diagnosis not present

## 2023-02-19 DIAGNOSIS — N2581 Secondary hyperparathyroidism of renal origin: Secondary | ICD-10-CM | POA: Diagnosis not present

## 2023-02-19 DIAGNOSIS — D509 Iron deficiency anemia, unspecified: Secondary | ICD-10-CM | POA: Diagnosis not present

## 2023-02-19 DIAGNOSIS — N186 End stage renal disease: Secondary | ICD-10-CM | POA: Diagnosis not present

## 2023-02-22 DIAGNOSIS — L299 Pruritus, unspecified: Secondary | ICD-10-CM | POA: Diagnosis not present

## 2023-02-22 DIAGNOSIS — Z992 Dependence on renal dialysis: Secondary | ICD-10-CM | POA: Diagnosis not present

## 2023-02-22 DIAGNOSIS — D509 Iron deficiency anemia, unspecified: Secondary | ICD-10-CM | POA: Diagnosis not present

## 2023-02-22 DIAGNOSIS — R52 Pain, unspecified: Secondary | ICD-10-CM | POA: Diagnosis not present

## 2023-02-22 DIAGNOSIS — N186 End stage renal disease: Secondary | ICD-10-CM | POA: Diagnosis not present

## 2023-02-22 DIAGNOSIS — E1129 Type 2 diabetes mellitus with other diabetic kidney complication: Secondary | ICD-10-CM | POA: Diagnosis not present

## 2023-02-22 DIAGNOSIS — E1122 Type 2 diabetes mellitus with diabetic chronic kidney disease: Secondary | ICD-10-CM | POA: Diagnosis not present

## 2023-02-22 DIAGNOSIS — R197 Diarrhea, unspecified: Secondary | ICD-10-CM | POA: Diagnosis not present

## 2023-02-22 DIAGNOSIS — D631 Anemia in chronic kidney disease: Secondary | ICD-10-CM | POA: Diagnosis not present

## 2023-02-22 DIAGNOSIS — N2581 Secondary hyperparathyroidism of renal origin: Secondary | ICD-10-CM | POA: Diagnosis not present

## 2023-02-25 DIAGNOSIS — L299 Pruritus, unspecified: Secondary | ICD-10-CM | POA: Diagnosis not present

## 2023-02-25 DIAGNOSIS — Z992 Dependence on renal dialysis: Secondary | ICD-10-CM | POA: Diagnosis not present

## 2023-02-25 DIAGNOSIS — R197 Diarrhea, unspecified: Secondary | ICD-10-CM | POA: Diagnosis not present

## 2023-02-25 DIAGNOSIS — N2581 Secondary hyperparathyroidism of renal origin: Secondary | ICD-10-CM | POA: Diagnosis not present

## 2023-02-25 DIAGNOSIS — R52 Pain, unspecified: Secondary | ICD-10-CM | POA: Diagnosis not present

## 2023-02-25 DIAGNOSIS — N186 End stage renal disease: Secondary | ICD-10-CM | POA: Diagnosis not present

## 2023-02-27 DIAGNOSIS — N2581 Secondary hyperparathyroidism of renal origin: Secondary | ICD-10-CM | POA: Diagnosis not present

## 2023-02-27 DIAGNOSIS — L299 Pruritus, unspecified: Secondary | ICD-10-CM | POA: Diagnosis not present

## 2023-02-27 DIAGNOSIS — R197 Diarrhea, unspecified: Secondary | ICD-10-CM | POA: Diagnosis not present

## 2023-02-27 DIAGNOSIS — Z992 Dependence on renal dialysis: Secondary | ICD-10-CM | POA: Diagnosis not present

## 2023-02-27 DIAGNOSIS — R52 Pain, unspecified: Secondary | ICD-10-CM | POA: Diagnosis not present

## 2023-02-27 DIAGNOSIS — N186 End stage renal disease: Secondary | ICD-10-CM | POA: Diagnosis not present

## 2023-02-28 ENCOUNTER — Encounter (HOSPITAL_COMMUNITY): Payer: Self-pay | Admitting: Cardiology

## 2023-02-28 ENCOUNTER — Ambulatory Visit (HOSPITAL_COMMUNITY)
Admission: RE | Admit: 2023-02-28 | Discharge: 2023-02-28 | Disposition: A | Discharge status: Home / Self Care | Payer: Medicare Other | Source: Ambulatory Visit | Attending: Cardiology | Admitting: Cardiology

## 2023-02-28 VITALS — BP 100/60 | HR 77 | Wt 213.0 lb

## 2023-02-28 DIAGNOSIS — I5020 Unspecified systolic (congestive) heart failure: Secondary | ICD-10-CM | POA: Diagnosis not present

## 2023-02-28 DIAGNOSIS — Z992 Dependence on renal dialysis: Secondary | ICD-10-CM | POA: Insufficient documentation

## 2023-02-28 DIAGNOSIS — I428 Other cardiomyopathies: Secondary | ICD-10-CM | POA: Diagnosis not present

## 2023-02-28 DIAGNOSIS — Z79899 Other long term (current) drug therapy: Secondary | ICD-10-CM | POA: Diagnosis not present

## 2023-02-28 DIAGNOSIS — I132 Hypertensive heart and chronic kidney disease with heart failure and with stage 5 chronic kidney disease, or end stage renal disease: Secondary | ICD-10-CM | POA: Diagnosis not present

## 2023-02-28 DIAGNOSIS — N186 End stage renal disease: Secondary | ICD-10-CM | POA: Insufficient documentation

## 2023-02-28 DIAGNOSIS — E1122 Type 2 diabetes mellitus with diabetic chronic kidney disease: Secondary | ICD-10-CM | POA: Insufficient documentation

## 2023-02-28 NOTE — Progress Notes (Signed)
   ADVANCED HEART FAILURE NEW PATIENT CLINIC NOTE  Referring Physician: Georgianne Fick, MD  Primary Care: Georgianne Fick, MD Primary Cardiologist:  HPI: Jonathan Mcneil is a 67 y.o. male with a PMH of ESRD, diabetes, hypertension, and nonischemic cardiomyopathy who presents for initial visit for further evaluation and treatment of heart failure/cardiomyopathy.      Patient has a long history of ESRD.  He was worked up at [facility] and underwent a left heart cath that showed normal coronaries, remainder of his workup was negative.  Per report echo was consistent with mild LVH, diminished E prime velocities and low voltage EKG.  He did not undergo further testing, but his [name] has had to be reduced over the past few months.  He has been turned down for transplant at both [facility] and [facility] due to his cardiac function.     SUBJECTIVE:  Patient overall states that he is doing well.  His blood pressure was getting low during dialysis but he started taking midodrine 5 mg on dialysis days which has helped.  He does not take his morning dose of Entresto on HD days, but instead takes it afterwards.  He denies any limitations from heart failure and his volume status is well-controlled with 3 weekly dialysis sessions.  PMH, current medications, allergies, social history, and family history reviewed in epic.  PHYSICAL EXAM: Vitals:   02/28/23 1434  BP: 100/60  Pulse: 77  SpO2: 99%   GENERAL: Well nourished and in no apparent distress at rest.  HEENT: The mucous membranes are pink and moist.   PULM:  Normal work of breathing, clear to auscultation bilaterally. Respirations are unlabored.  CARDIAC:  JVP: not elevated         Normal rate with regular rhythm. No murmurs, rubs or gallops.  No lower extremity edema.  ABDOMEN: Soft, non-tender, non-distended. NEUROLOGIC: Patient is oriented x3 with no focal or lateralizing neurologic deficits.  PSYCH: Patients affect is appropriate, there is  no evidence of anxiety or depression.  SKIN: Left upper extremity fistula  DATA REVIEW   ECHO: 05/2022: LVEF 30%, mild LVH with LV dilation.  Grade 2 diastolic dysfunction, moderately dilated RV with severe TR.  CATH: LHC 07/09/2021 - No significant coronary artery disease - Large main branch coronary arteries - Elevated LVEDP     Heart failure review: - Classification: Heart failure with reduced EF - Etiology: Work up ongoing - NYHA Class: II   ASSESSMENT & PLAN:  Heart failure with reduced EF: Nonischemic, most likely etiology is longstanding hypertension.  Apparently some concern for infiltrative disease, however given ESRD is not a tafamidis candidate. Will repeat echocardiogram and consider further workup based on imaging. Otherwise no room to advanced medical therapy. - Repeat echocardiogram -Continue Entresto 24/26 mg twice daily -Continue metoprolol succinate 37.5 mg daily -Continue midodrine on nondialysis days -Not an advanced therapies candidate  ESRD: T/Th/S HD. - Continue follow up with nephrology  HTN: Well controlled today. - therapy as above      Clearnce Hasten, MD Advanced Heart Failure Mechanical Circulatory Support 02/28/23

## 2023-02-28 NOTE — Patient Instructions (Signed)
Medication Changes:  No Changes In Medications at this time.   Testing/Procedures:  Your physician has requested that you have an echocardiogram. Echocardiography is a painless test that uses sound waves to create images of your heart. It provides your doctor with information about the size and shape of your heart and how well your heart's chambers and valves are working. This procedure takes approximately one hour. There are no restrictions for this procedure. Please do NOT wear cologne, perfume, aftershave, or lotions (deodorant is allowed). Please arrive 15 minutes prior to your appointment time.  Please note: We ask at that you not bring children with you during ultrasound (echo/ vascular) testing. Due to room size and safety concerns, children are not allowed in the ultrasound rooms during exams. Our front office staff cannot provide observation of children in our lobby area while testing is being conducted. An adult accompanying a patient to their appointment will only be allowed in the ultrasound room at the discretion of the ultrasound technician under special circumstances. We apologize for any inconvenience.  Follow-Up in: 3 months with Dr. Elwyn Lade PLEASE CALL OUR OFFICE AROUND January 2025 TO GET SCHEDULED FOR YOUR APPOINTMENT. PHONE NUMBER IS [phone] OPTION 2   At the [facility], you and your health needs are our priority. We have a designated team specialized in the treatment of Heart Failure. This Care Team includes your primary Heart Failure Specialized Cardiologist (physician), Advanced Practice Providers (APPs- Physician Assistants and Nurse Practitioners), and Pharmacist who all work together to provide you with the care you need, when you need it.   You may see any of the following providers on your designated Care Team at your next follow up:  Dr. Arvilla Meres Dr. Marca Ancona Dr. Dorthula Nettles Dr. Theresia Bough Tonye Becket, NP Robbie Lis,  [place] [facility] [place], [place] Brynda Peon, NP [place] Lee, NP Karle Plumber, PharmD   Please be sure to bring in all your medications bottles to every appointment.   Need to Contact us:  If you have any questions or concerns before your next appointment please send [place] a message through [place] or call our office at [phone].    TO LEAVE A MESSAGE FOR THE NURSE SELECT OPTION 2, PLEASE LEAVE A MESSAGE INCLUDING: YOUR NAME DATE OF BIRTH CALL BACK NUMBER REASON FOR CALL**this is important as we prioritize the call backs  YOU WILL RECEIVE A CALL BACK THE SAME DAY AS LONG AS YOU CALL BEFORE 4:00 PM

## 2023-03-01 DIAGNOSIS — Z992 Dependence on renal dialysis: Secondary | ICD-10-CM | POA: Diagnosis not present

## 2023-03-01 DIAGNOSIS — N2581 Secondary hyperparathyroidism of renal origin: Secondary | ICD-10-CM | POA: Diagnosis not present

## 2023-03-01 DIAGNOSIS — R197 Diarrhea, unspecified: Secondary | ICD-10-CM | POA: Diagnosis not present

## 2023-03-01 DIAGNOSIS — N186 End stage renal disease: Secondary | ICD-10-CM | POA: Diagnosis not present

## 2023-03-01 DIAGNOSIS — R52 Pain, unspecified: Secondary | ICD-10-CM | POA: Diagnosis not present

## 2023-03-01 DIAGNOSIS — L299 Pruritus, unspecified: Secondary | ICD-10-CM | POA: Diagnosis not present

## 2023-03-04 DIAGNOSIS — N2581 Secondary hyperparathyroidism of renal origin: Secondary | ICD-10-CM | POA: Diagnosis not present

## 2023-03-04 DIAGNOSIS — R197 Diarrhea, unspecified: Secondary | ICD-10-CM | POA: Diagnosis not present

## 2023-03-04 DIAGNOSIS — Z992 Dependence on renal dialysis: Secondary | ICD-10-CM | POA: Diagnosis not present

## 2023-03-04 DIAGNOSIS — N186 End stage renal disease: Secondary | ICD-10-CM | POA: Diagnosis not present

## 2023-03-04 DIAGNOSIS — R52 Pain, unspecified: Secondary | ICD-10-CM | POA: Diagnosis not present

## 2023-03-04 DIAGNOSIS — L299 Pruritus, unspecified: Secondary | ICD-10-CM | POA: Diagnosis not present

## 2023-03-06 DIAGNOSIS — R52 Pain, unspecified: Secondary | ICD-10-CM | POA: Diagnosis not present

## 2023-03-06 DIAGNOSIS — N2581 Secondary hyperparathyroidism of renal origin: Secondary | ICD-10-CM | POA: Diagnosis not present

## 2023-03-06 DIAGNOSIS — Z992 Dependence on renal dialysis: Secondary | ICD-10-CM | POA: Diagnosis not present

## 2023-03-06 DIAGNOSIS — N186 End stage renal disease: Secondary | ICD-10-CM | POA: Diagnosis not present

## 2023-03-06 DIAGNOSIS — R197 Diarrhea, unspecified: Secondary | ICD-10-CM | POA: Diagnosis not present

## 2023-03-06 DIAGNOSIS — L299 Pruritus, unspecified: Secondary | ICD-10-CM | POA: Diagnosis not present

## 2023-03-08 DIAGNOSIS — R197 Diarrhea, unspecified: Secondary | ICD-10-CM | POA: Diagnosis not present

## 2023-03-08 DIAGNOSIS — L299 Pruritus, unspecified: Secondary | ICD-10-CM | POA: Diagnosis not present

## 2023-03-08 DIAGNOSIS — N2581 Secondary hyperparathyroidism of renal origin: Secondary | ICD-10-CM | POA: Diagnosis not present

## 2023-03-08 DIAGNOSIS — Z992 Dependence on renal dialysis: Secondary | ICD-10-CM | POA: Diagnosis not present

## 2023-03-08 DIAGNOSIS — N186 End stage renal disease: Secondary | ICD-10-CM | POA: Diagnosis not present

## 2023-03-08 DIAGNOSIS — R52 Pain, unspecified: Secondary | ICD-10-CM | POA: Diagnosis not present

## 2023-03-11 DIAGNOSIS — N186 End stage renal disease: Secondary | ICD-10-CM | POA: Diagnosis not present

## 2023-03-11 DIAGNOSIS — N2581 Secondary hyperparathyroidism of renal origin: Secondary | ICD-10-CM | POA: Diagnosis not present

## 2023-03-11 DIAGNOSIS — L299 Pruritus, unspecified: Secondary | ICD-10-CM | POA: Diagnosis not present

## 2023-03-11 DIAGNOSIS — R52 Pain, unspecified: Secondary | ICD-10-CM | POA: Diagnosis not present

## 2023-03-11 DIAGNOSIS — R197 Diarrhea, unspecified: Secondary | ICD-10-CM | POA: Diagnosis not present

## 2023-03-11 DIAGNOSIS — Z992 Dependence on renal dialysis: Secondary | ICD-10-CM | POA: Diagnosis not present

## 2023-03-13 DIAGNOSIS — N2581 Secondary hyperparathyroidism of renal origin: Secondary | ICD-10-CM | POA: Diagnosis not present

## 2023-03-13 DIAGNOSIS — R197 Diarrhea, unspecified: Secondary | ICD-10-CM | POA: Diagnosis not present

## 2023-03-13 DIAGNOSIS — R52 Pain, unspecified: Secondary | ICD-10-CM | POA: Diagnosis not present

## 2023-03-13 DIAGNOSIS — N186 End stage renal disease: Secondary | ICD-10-CM | POA: Diagnosis not present

## 2023-03-13 DIAGNOSIS — Z992 Dependence on renal dialysis: Secondary | ICD-10-CM | POA: Diagnosis not present

## 2023-03-13 DIAGNOSIS — L299 Pruritus, unspecified: Secondary | ICD-10-CM | POA: Diagnosis not present

## 2023-03-15 DIAGNOSIS — N2581 Secondary hyperparathyroidism of renal origin: Secondary | ICD-10-CM | POA: Diagnosis not present

## 2023-03-15 DIAGNOSIS — R52 Pain, unspecified: Secondary | ICD-10-CM | POA: Diagnosis not present

## 2023-03-15 DIAGNOSIS — N186 End stage renal disease: Secondary | ICD-10-CM | POA: Diagnosis not present

## 2023-03-15 DIAGNOSIS — R197 Diarrhea, unspecified: Secondary | ICD-10-CM | POA: Diagnosis not present

## 2023-03-15 DIAGNOSIS — Z992 Dependence on renal dialysis: Secondary | ICD-10-CM | POA: Diagnosis not present

## 2023-03-15 DIAGNOSIS — L299 Pruritus, unspecified: Secondary | ICD-10-CM | POA: Diagnosis not present

## 2023-03-17 DIAGNOSIS — R52 Pain, unspecified: Secondary | ICD-10-CM | POA: Diagnosis not present

## 2023-03-17 DIAGNOSIS — R197 Diarrhea, unspecified: Secondary | ICD-10-CM | POA: Diagnosis not present

## 2023-03-17 DIAGNOSIS — Z992 Dependence on renal dialysis: Secondary | ICD-10-CM | POA: Diagnosis not present

## 2023-03-17 DIAGNOSIS — N186 End stage renal disease: Secondary | ICD-10-CM | POA: Diagnosis not present

## 2023-03-17 DIAGNOSIS — L299 Pruritus, unspecified: Secondary | ICD-10-CM | POA: Diagnosis not present

## 2023-03-17 DIAGNOSIS — N2581 Secondary hyperparathyroidism of renal origin: Secondary | ICD-10-CM | POA: Diagnosis not present

## 2023-03-20 DIAGNOSIS — Z992 Dependence on renal dialysis: Secondary | ICD-10-CM | POA: Diagnosis not present

## 2023-03-20 DIAGNOSIS — R197 Diarrhea, unspecified: Secondary | ICD-10-CM | POA: Diagnosis not present

## 2023-03-20 DIAGNOSIS — N2581 Secondary hyperparathyroidism of renal origin: Secondary | ICD-10-CM | POA: Diagnosis not present

## 2023-03-20 DIAGNOSIS — R52 Pain, unspecified: Secondary | ICD-10-CM | POA: Diagnosis not present

## 2023-03-20 DIAGNOSIS — L299 Pruritus, unspecified: Secondary | ICD-10-CM | POA: Diagnosis not present

## 2023-03-20 DIAGNOSIS — N186 End stage renal disease: Secondary | ICD-10-CM | POA: Diagnosis not present

## 2023-03-22 DIAGNOSIS — N186 End stage renal disease: Secondary | ICD-10-CM | POA: Diagnosis not present

## 2023-03-22 DIAGNOSIS — L299 Pruritus, unspecified: Secondary | ICD-10-CM | POA: Diagnosis not present

## 2023-03-22 DIAGNOSIS — N2581 Secondary hyperparathyroidism of renal origin: Secondary | ICD-10-CM | POA: Diagnosis not present

## 2023-03-22 DIAGNOSIS — R197 Diarrhea, unspecified: Secondary | ICD-10-CM | POA: Diagnosis not present

## 2023-03-22 DIAGNOSIS — R52 Pain, unspecified: Secondary | ICD-10-CM | POA: Diagnosis not present

## 2023-03-22 DIAGNOSIS — Z992 Dependence on renal dialysis: Secondary | ICD-10-CM | POA: Diagnosis not present

## 2023-03-24 DIAGNOSIS — R52 Pain, unspecified: Secondary | ICD-10-CM | POA: Diagnosis not present

## 2023-03-24 DIAGNOSIS — N2581 Secondary hyperparathyroidism of renal origin: Secondary | ICD-10-CM | POA: Diagnosis not present

## 2023-03-24 DIAGNOSIS — R197 Diarrhea, unspecified: Secondary | ICD-10-CM | POA: Diagnosis not present

## 2023-03-24 DIAGNOSIS — N186 End stage renal disease: Secondary | ICD-10-CM | POA: Diagnosis not present

## 2023-03-24 DIAGNOSIS — Z992 Dependence on renal dialysis: Secondary | ICD-10-CM | POA: Diagnosis not present

## 2023-03-24 DIAGNOSIS — L299 Pruritus, unspecified: Secondary | ICD-10-CM | POA: Diagnosis not present

## 2023-03-25 DIAGNOSIS — E1122 Type 2 diabetes mellitus with diabetic chronic kidney disease: Secondary | ICD-10-CM | POA: Diagnosis not present

## 2023-03-25 DIAGNOSIS — N186 End stage renal disease: Secondary | ICD-10-CM | POA: Diagnosis not present

## 2023-03-25 DIAGNOSIS — Z992 Dependence on renal dialysis: Secondary | ICD-10-CM | POA: Diagnosis not present

## 2023-03-27 DIAGNOSIS — D631 Anemia in chronic kidney disease: Secondary | ICD-10-CM | POA: Diagnosis not present

## 2023-03-27 DIAGNOSIS — L299 Pruritus, unspecified: Secondary | ICD-10-CM | POA: Diagnosis not present

## 2023-03-27 DIAGNOSIS — E1129 Type 2 diabetes mellitus with other diabetic kidney complication: Secondary | ICD-10-CM | POA: Diagnosis not present

## 2023-03-27 DIAGNOSIS — N2581 Secondary hyperparathyroidism of renal origin: Secondary | ICD-10-CM | POA: Diagnosis not present

## 2023-03-27 DIAGNOSIS — R52 Pain, unspecified: Secondary | ICD-10-CM | POA: Diagnosis not present

## 2023-03-27 DIAGNOSIS — Z992 Dependence on renal dialysis: Secondary | ICD-10-CM | POA: Diagnosis not present

## 2023-03-27 DIAGNOSIS — N186 End stage renal disease: Secondary | ICD-10-CM | POA: Diagnosis not present

## 2023-03-29 DIAGNOSIS — E1129 Type 2 diabetes mellitus with other diabetic kidney complication: Secondary | ICD-10-CM | POA: Diagnosis not present

## 2023-03-29 DIAGNOSIS — L299 Pruritus, unspecified: Secondary | ICD-10-CM | POA: Diagnosis not present

## 2023-03-29 DIAGNOSIS — N186 End stage renal disease: Secondary | ICD-10-CM | POA: Diagnosis not present

## 2023-03-29 DIAGNOSIS — N2581 Secondary hyperparathyroidism of renal origin: Secondary | ICD-10-CM | POA: Diagnosis not present

## 2023-03-29 DIAGNOSIS — R52 Pain, unspecified: Secondary | ICD-10-CM | POA: Diagnosis not present

## 2023-03-29 DIAGNOSIS — D631 Anemia in chronic kidney disease: Secondary | ICD-10-CM | POA: Diagnosis not present

## 2023-03-29 DIAGNOSIS — Z992 Dependence on renal dialysis: Secondary | ICD-10-CM | POA: Diagnosis not present

## 2023-04-01 DIAGNOSIS — R52 Pain, unspecified: Secondary | ICD-10-CM | POA: Diagnosis not present

## 2023-04-01 DIAGNOSIS — D631 Anemia in chronic kidney disease: Secondary | ICD-10-CM | POA: Diagnosis not present

## 2023-04-01 DIAGNOSIS — Z992 Dependence on renal dialysis: Secondary | ICD-10-CM | POA: Diagnosis not present

## 2023-04-01 DIAGNOSIS — N2581 Secondary hyperparathyroidism of renal origin: Secondary | ICD-10-CM | POA: Diagnosis not present

## 2023-04-01 DIAGNOSIS — N186 End stage renal disease: Secondary | ICD-10-CM | POA: Diagnosis not present

## 2023-04-01 DIAGNOSIS — E1129 Type 2 diabetes mellitus with other diabetic kidney complication: Secondary | ICD-10-CM | POA: Diagnosis not present

## 2023-04-01 DIAGNOSIS — L299 Pruritus, unspecified: Secondary | ICD-10-CM | POA: Diagnosis not present

## 2023-04-02 ENCOUNTER — Telehealth (HOSPITAL_COMMUNITY): Payer: Self-pay | Admitting: Cardiology

## 2023-04-03 DIAGNOSIS — L299 Pruritus, unspecified: Secondary | ICD-10-CM | POA: Diagnosis not present

## 2023-04-03 DIAGNOSIS — N2581 Secondary hyperparathyroidism of renal origin: Secondary | ICD-10-CM | POA: Diagnosis not present

## 2023-04-03 DIAGNOSIS — D631 Anemia in chronic kidney disease: Secondary | ICD-10-CM | POA: Diagnosis not present

## 2023-04-03 DIAGNOSIS — R52 Pain, unspecified: Secondary | ICD-10-CM | POA: Diagnosis not present

## 2023-04-03 DIAGNOSIS — E1129 Type 2 diabetes mellitus with other diabetic kidney complication: Secondary | ICD-10-CM | POA: Diagnosis not present

## 2023-04-03 DIAGNOSIS — Z992 Dependence on renal dialysis: Secondary | ICD-10-CM | POA: Diagnosis not present

## 2023-04-03 DIAGNOSIS — N186 End stage renal disease: Secondary | ICD-10-CM | POA: Diagnosis not present

## 2023-04-05 DIAGNOSIS — N2581 Secondary hyperparathyroidism of renal origin: Secondary | ICD-10-CM | POA: Diagnosis not present

## 2023-04-05 DIAGNOSIS — D631 Anemia in chronic kidney disease: Secondary | ICD-10-CM | POA: Diagnosis not present

## 2023-04-05 DIAGNOSIS — N186 End stage renal disease: Secondary | ICD-10-CM | POA: Diagnosis not present

## 2023-04-05 DIAGNOSIS — E1129 Type 2 diabetes mellitus with other diabetic kidney complication: Secondary | ICD-10-CM | POA: Diagnosis not present

## 2023-04-05 DIAGNOSIS — Z992 Dependence on renal dialysis: Secondary | ICD-10-CM | POA: Diagnosis not present

## 2023-04-05 DIAGNOSIS — R52 Pain, unspecified: Secondary | ICD-10-CM | POA: Diagnosis not present

## 2023-04-05 DIAGNOSIS — L299 Pruritus, unspecified: Secondary | ICD-10-CM | POA: Diagnosis not present

## 2023-04-08 ENCOUNTER — Encounter (INDEPENDENT_AMBULATORY_CARE_PROVIDER_SITE_OTHER): Admitting: Ophthalmology

## 2023-04-08 DIAGNOSIS — R52 Pain, unspecified: Secondary | ICD-10-CM | POA: Diagnosis not present

## 2023-04-08 DIAGNOSIS — E1129 Type 2 diabetes mellitus with other diabetic kidney complication: Secondary | ICD-10-CM | POA: Diagnosis not present

## 2023-04-08 DIAGNOSIS — N186 End stage renal disease: Secondary | ICD-10-CM | POA: Diagnosis not present

## 2023-04-08 DIAGNOSIS — Z992 Dependence on renal dialysis: Secondary | ICD-10-CM | POA: Diagnosis not present

## 2023-04-08 DIAGNOSIS — D631 Anemia in chronic kidney disease: Secondary | ICD-10-CM | POA: Diagnosis not present

## 2023-04-08 DIAGNOSIS — N2581 Secondary hyperparathyroidism of renal origin: Secondary | ICD-10-CM | POA: Diagnosis not present

## 2023-04-08 DIAGNOSIS — L299 Pruritus, unspecified: Secondary | ICD-10-CM | POA: Diagnosis not present

## 2023-04-09 ENCOUNTER — Encounter (INDEPENDENT_AMBULATORY_CARE_PROVIDER_SITE_OTHER): Payer: Medicare Other | Admitting: Ophthalmology

## 2023-04-09 DIAGNOSIS — H35033 Hypertensive retinopathy, bilateral: Secondary | ICD-10-CM

## 2023-04-09 DIAGNOSIS — E113513 Type 2 diabetes mellitus with proliferative diabetic retinopathy with macular edema, bilateral: Secondary | ICD-10-CM

## 2023-04-09 DIAGNOSIS — H25812 Combined forms of age-related cataract, left eye: Secondary | ICD-10-CM

## 2023-04-09 DIAGNOSIS — H40053 Ocular hypertension, bilateral: Secondary | ICD-10-CM

## 2023-04-09 DIAGNOSIS — H4311 Vitreous hemorrhage, right eye: Secondary | ICD-10-CM

## 2023-04-09 DIAGNOSIS — H43812 Vitreous degeneration, left eye: Secondary | ICD-10-CM

## 2023-04-09 DIAGNOSIS — H4312 Vitreous hemorrhage, left eye: Secondary | ICD-10-CM

## 2023-04-09 DIAGNOSIS — H35372 Puckering of macula, left eye: Secondary | ICD-10-CM

## 2023-04-09 DIAGNOSIS — I10 Essential (primary) hypertension: Secondary | ICD-10-CM

## 2023-04-09 DIAGNOSIS — Z961 Presence of intraocular lens: Secondary | ICD-10-CM

## 2023-04-10 DIAGNOSIS — N2581 Secondary hyperparathyroidism of renal origin: Secondary | ICD-10-CM | POA: Diagnosis not present

## 2023-04-10 DIAGNOSIS — R52 Pain, unspecified: Secondary | ICD-10-CM | POA: Diagnosis not present

## 2023-04-10 DIAGNOSIS — D631 Anemia in chronic kidney disease: Secondary | ICD-10-CM | POA: Diagnosis not present

## 2023-04-10 DIAGNOSIS — E1129 Type 2 diabetes mellitus with other diabetic kidney complication: Secondary | ICD-10-CM | POA: Diagnosis not present

## 2023-04-10 DIAGNOSIS — L299 Pruritus, unspecified: Secondary | ICD-10-CM | POA: Diagnosis not present

## 2023-04-10 DIAGNOSIS — N186 End stage renal disease: Secondary | ICD-10-CM | POA: Diagnosis not present

## 2023-04-10 DIAGNOSIS — Z992 Dependence on renal dialysis: Secondary | ICD-10-CM | POA: Diagnosis not present

## 2023-04-12 DIAGNOSIS — Z992 Dependence on renal dialysis: Secondary | ICD-10-CM | POA: Diagnosis not present

## 2023-04-12 DIAGNOSIS — N186 End stage renal disease: Secondary | ICD-10-CM | POA: Diagnosis not present

## 2023-04-12 DIAGNOSIS — R52 Pain, unspecified: Secondary | ICD-10-CM | POA: Diagnosis not present

## 2023-04-12 DIAGNOSIS — D631 Anemia in chronic kidney disease: Secondary | ICD-10-CM | POA: Diagnosis not present

## 2023-04-12 DIAGNOSIS — L299 Pruritus, unspecified: Secondary | ICD-10-CM | POA: Diagnosis not present

## 2023-04-12 DIAGNOSIS — N2581 Secondary hyperparathyroidism of renal origin: Secondary | ICD-10-CM | POA: Diagnosis not present

## 2023-04-12 DIAGNOSIS — E1129 Type 2 diabetes mellitus with other diabetic kidney complication: Secondary | ICD-10-CM | POA: Diagnosis not present

## 2023-04-15 DIAGNOSIS — N2581 Secondary hyperparathyroidism of renal origin: Secondary | ICD-10-CM | POA: Diagnosis not present

## 2023-04-15 DIAGNOSIS — E1129 Type 2 diabetes mellitus with other diabetic kidney complication: Secondary | ICD-10-CM | POA: Diagnosis not present

## 2023-04-15 DIAGNOSIS — L299 Pruritus, unspecified: Secondary | ICD-10-CM | POA: Diagnosis not present

## 2023-04-15 DIAGNOSIS — R52 Pain, unspecified: Secondary | ICD-10-CM | POA: Diagnosis not present

## 2023-04-15 DIAGNOSIS — D631 Anemia in chronic kidney disease: Secondary | ICD-10-CM | POA: Diagnosis not present

## 2023-04-15 DIAGNOSIS — Z992 Dependence on renal dialysis: Secondary | ICD-10-CM | POA: Diagnosis not present

## 2023-04-15 DIAGNOSIS — N186 End stage renal disease: Secondary | ICD-10-CM | POA: Diagnosis not present

## 2023-04-17 DIAGNOSIS — Z992 Dependence on renal dialysis: Secondary | ICD-10-CM | POA: Diagnosis not present

## 2023-04-17 DIAGNOSIS — D631 Anemia in chronic kidney disease: Secondary | ICD-10-CM | POA: Diagnosis not present

## 2023-04-17 DIAGNOSIS — N2581 Secondary hyperparathyroidism of renal origin: Secondary | ICD-10-CM | POA: Diagnosis not present

## 2023-04-17 DIAGNOSIS — E1129 Type 2 diabetes mellitus with other diabetic kidney complication: Secondary | ICD-10-CM | POA: Diagnosis not present

## 2023-04-17 DIAGNOSIS — N186 End stage renal disease: Secondary | ICD-10-CM | POA: Diagnosis not present

## 2023-04-17 DIAGNOSIS — R52 Pain, unspecified: Secondary | ICD-10-CM | POA: Diagnosis not present

## 2023-04-17 DIAGNOSIS — L299 Pruritus, unspecified: Secondary | ICD-10-CM | POA: Diagnosis not present

## 2023-04-19 DIAGNOSIS — Z992 Dependence on renal dialysis: Secondary | ICD-10-CM | POA: Diagnosis not present

## 2023-04-19 DIAGNOSIS — N186 End stage renal disease: Secondary | ICD-10-CM | POA: Diagnosis not present

## 2023-04-19 DIAGNOSIS — N2581 Secondary hyperparathyroidism of renal origin: Secondary | ICD-10-CM | POA: Diagnosis not present

## 2023-04-19 DIAGNOSIS — E1129 Type 2 diabetes mellitus with other diabetic kidney complication: Secondary | ICD-10-CM | POA: Diagnosis not present

## 2023-04-19 DIAGNOSIS — R52 Pain, unspecified: Secondary | ICD-10-CM | POA: Diagnosis not present

## 2023-04-19 DIAGNOSIS — L299 Pruritus, unspecified: Secondary | ICD-10-CM | POA: Diagnosis not present

## 2023-04-19 DIAGNOSIS — D631 Anemia in chronic kidney disease: Secondary | ICD-10-CM | POA: Diagnosis not present

## 2023-04-22 DIAGNOSIS — N2581 Secondary hyperparathyroidism of renal origin: Secondary | ICD-10-CM | POA: Diagnosis not present

## 2023-04-22 DIAGNOSIS — L299 Pruritus, unspecified: Secondary | ICD-10-CM | POA: Diagnosis not present

## 2023-04-22 DIAGNOSIS — Z992 Dependence on renal dialysis: Secondary | ICD-10-CM | POA: Diagnosis not present

## 2023-04-22 DIAGNOSIS — R52 Pain, unspecified: Secondary | ICD-10-CM | POA: Diagnosis not present

## 2023-04-22 DIAGNOSIS — E1129 Type 2 diabetes mellitus with other diabetic kidney complication: Secondary | ICD-10-CM | POA: Diagnosis not present

## 2023-04-22 DIAGNOSIS — N186 End stage renal disease: Secondary | ICD-10-CM | POA: Diagnosis not present

## 2023-04-22 DIAGNOSIS — D631 Anemia in chronic kidney disease: Secondary | ICD-10-CM | POA: Diagnosis not present

## 2023-04-24 DIAGNOSIS — E1129 Type 2 diabetes mellitus with other diabetic kidney complication: Secondary | ICD-10-CM | POA: Diagnosis not present

## 2023-04-24 DIAGNOSIS — L299 Pruritus, unspecified: Secondary | ICD-10-CM | POA: Diagnosis not present

## 2023-04-24 DIAGNOSIS — N2581 Secondary hyperparathyroidism of renal origin: Secondary | ICD-10-CM | POA: Diagnosis not present

## 2023-04-24 DIAGNOSIS — N186 End stage renal disease: Secondary | ICD-10-CM | POA: Diagnosis not present

## 2023-04-24 DIAGNOSIS — Z992 Dependence on renal dialysis: Secondary | ICD-10-CM | POA: Diagnosis not present

## 2023-04-24 DIAGNOSIS — R52 Pain, unspecified: Secondary | ICD-10-CM | POA: Diagnosis not present

## 2023-04-24 DIAGNOSIS — D631 Anemia in chronic kidney disease: Secondary | ICD-10-CM | POA: Diagnosis not present

## 2023-04-25 DIAGNOSIS — N186 End stage renal disease: Secondary | ICD-10-CM | POA: Diagnosis not present

## 2023-04-25 DIAGNOSIS — Z992 Dependence on renal dialysis: Secondary | ICD-10-CM | POA: Diagnosis not present

## 2023-04-25 DIAGNOSIS — E1122 Type 2 diabetes mellitus with diabetic chronic kidney disease: Secondary | ICD-10-CM | POA: Diagnosis not present

## 2023-04-26 DIAGNOSIS — E1129 Type 2 diabetes mellitus with other diabetic kidney complication: Secondary | ICD-10-CM | POA: Diagnosis not present

## 2023-04-26 DIAGNOSIS — N186 End stage renal disease: Secondary | ICD-10-CM | POA: Diagnosis not present

## 2023-04-26 DIAGNOSIS — N2581 Secondary hyperparathyroidism of renal origin: Secondary | ICD-10-CM | POA: Diagnosis not present

## 2023-04-26 DIAGNOSIS — R52 Pain, unspecified: Secondary | ICD-10-CM | POA: Diagnosis not present

## 2023-04-26 DIAGNOSIS — Z992 Dependence on renal dialysis: Secondary | ICD-10-CM | POA: Diagnosis not present

## 2023-04-26 DIAGNOSIS — D631 Anemia in chronic kidney disease: Secondary | ICD-10-CM | POA: Diagnosis not present

## 2023-04-28 ENCOUNTER — Ambulatory Visit: Payer: Medicare Other

## 2023-04-28 ENCOUNTER — Ambulatory Visit (INDEPENDENT_AMBULATORY_CARE_PROVIDER_SITE_OTHER): Payer: Medicare Other | Admitting: Podiatry

## 2023-04-28 ENCOUNTER — Encounter: Payer: Self-pay | Admitting: Podiatry

## 2023-04-28 DIAGNOSIS — E1149 Type 2 diabetes mellitus with other diabetic neurological complication: Secondary | ICD-10-CM

## 2023-04-28 DIAGNOSIS — M2021 Hallux rigidus, right foot: Secondary | ICD-10-CM

## 2023-04-28 DIAGNOSIS — B351 Tinea unguium: Secondary | ICD-10-CM | POA: Diagnosis not present

## 2023-04-28 DIAGNOSIS — M79675 Pain in left toe(s): Secondary | ICD-10-CM

## 2023-04-28 DIAGNOSIS — M79674 Pain in right toe(s): Secondary | ICD-10-CM | POA: Diagnosis not present

## 2023-04-28 DIAGNOSIS — E119 Type 2 diabetes mellitus without complications: Secondary | ICD-10-CM | POA: Diagnosis not present

## 2023-04-28 DIAGNOSIS — Z0189 Encounter for other specified special examinations: Secondary | ICD-10-CM

## 2023-04-28 DIAGNOSIS — I739 Peripheral vascular disease, unspecified: Secondary | ICD-10-CM

## 2023-04-28 DIAGNOSIS — M2141 Flat foot [pes planus] (acquired), right foot: Secondary | ICD-10-CM

## 2023-04-28 NOTE — Progress Notes (Signed)
Patient presents to the office today for diabetic shoe and insole measuring.  Patient was measured with brannock device to determine size and width for 1 pair of extra depth shoes and foam casted for 3 pair of insoles.   Documentation of medical necessity will be sent to patient's treating diabetic doctor to verify and sign.   Patient's diabetic provider:  DR Georgianne Fick  Shoes and insoles will be ordered at that time and patient will be notified for an appointment for fitting when they arrive.  Brannock measurement: 10.5 Shoe choice:   [id number] // [id number] Shoe size ordered: 10.5WD  Ppw/ ABN signed

## 2023-04-28 NOTE — Patient Instructions (Signed)

## 2023-04-28 NOTE — Progress Notes (Signed)
Subjective: Chief Complaint  Patient presents with   [facility]    RM#11 [facility] patient states he has no concerns today.Blood sugars under control.     68 y.o. returns the office today for yearly diabetic foot evaluation as well as for painful, elongated, thickened toenails which he cannot trim himself.  Subsequently diabetic shoes today.  No open lesions.  Gabpentin helping- taking once a day  PCP: Georgianne Fick, MD   Objective: AAO 3, NAD DP/PT pulses palpable, CRT less than 3 seconds Sensation decreased with Phoebe Perch. Nails hypertrophic, dystrophic, elongated, brittle, discolored 10. There is tenderness overlying the nails 1-5 bilaterally. There is no surrounding erythema or drainage along the nail sites. No open lesions or pre-ulcerative lesions are identified. Arthritic changes present the first MPJ with decreased range of motion.  There is no other areas of discomfort today. No pain with calf compression, swelling, warmth, erythema.  Assessment: Patient presents with symptomatic onychomycosis; type 2 diabetes with neuropathy  Plan: Diabetic foot exam Overall he is doing well.  There is no skin breakdown or any signs of infection or any ulcerations.  Discussed importance of daily foot inspection although this can help. -He was seen today by Nicki Guadalajara, pedorthist for measurement of diabetic shoes, insoles.  Symptomatic onychomycosis -Nails sharply debrided 10 without complication/bleeding.  Return in about 3 months (around 07/26/2023) for diabetic foot care.  Vivi Barrack DPM

## 2023-04-29 DIAGNOSIS — Z992 Dependence on renal dialysis: Secondary | ICD-10-CM | POA: Diagnosis not present

## 2023-04-29 DIAGNOSIS — E1129 Type 2 diabetes mellitus with other diabetic kidney complication: Secondary | ICD-10-CM | POA: Diagnosis not present

## 2023-04-29 DIAGNOSIS — N2581 Secondary hyperparathyroidism of renal origin: Secondary | ICD-10-CM | POA: Diagnosis not present

## 2023-04-29 DIAGNOSIS — D631 Anemia in chronic kidney disease: Secondary | ICD-10-CM | POA: Diagnosis not present

## 2023-04-29 DIAGNOSIS — N186 End stage renal disease: Secondary | ICD-10-CM | POA: Diagnosis not present

## 2023-04-29 DIAGNOSIS — R52 Pain, unspecified: Secondary | ICD-10-CM | POA: Diagnosis not present

## 2023-05-01 DIAGNOSIS — R52 Pain, unspecified: Secondary | ICD-10-CM | POA: Diagnosis not present

## 2023-05-01 DIAGNOSIS — Z992 Dependence on renal dialysis: Secondary | ICD-10-CM | POA: Diagnosis not present

## 2023-05-01 DIAGNOSIS — D631 Anemia in chronic kidney disease: Secondary | ICD-10-CM | POA: Diagnosis not present

## 2023-05-01 DIAGNOSIS — N186 End stage renal disease: Secondary | ICD-10-CM | POA: Diagnosis not present

## 2023-05-01 DIAGNOSIS — N2581 Secondary hyperparathyroidism of renal origin: Secondary | ICD-10-CM | POA: Diagnosis not present

## 2023-05-01 DIAGNOSIS — E1129 Type 2 diabetes mellitus with other diabetic kidney complication: Secondary | ICD-10-CM | POA: Diagnosis not present

## 2023-05-03 DIAGNOSIS — N186 End stage renal disease: Secondary | ICD-10-CM | POA: Diagnosis not present

## 2023-05-03 DIAGNOSIS — N2581 Secondary hyperparathyroidism of renal origin: Secondary | ICD-10-CM | POA: Diagnosis not present

## 2023-05-03 DIAGNOSIS — Z992 Dependence on renal dialysis: Secondary | ICD-10-CM | POA: Diagnosis not present

## 2023-05-03 DIAGNOSIS — E1129 Type 2 diabetes mellitus with other diabetic kidney complication: Secondary | ICD-10-CM | POA: Diagnosis not present

## 2023-05-03 DIAGNOSIS — D631 Anemia in chronic kidney disease: Secondary | ICD-10-CM | POA: Diagnosis not present

## 2023-05-03 DIAGNOSIS — R52 Pain, unspecified: Secondary | ICD-10-CM | POA: Diagnosis not present

## 2023-05-06 DIAGNOSIS — Z992 Dependence on renal dialysis: Secondary | ICD-10-CM | POA: Diagnosis not present

## 2023-05-06 DIAGNOSIS — D631 Anemia in chronic kidney disease: Secondary | ICD-10-CM | POA: Diagnosis not present

## 2023-05-06 DIAGNOSIS — N186 End stage renal disease: Secondary | ICD-10-CM | POA: Diagnosis not present

## 2023-05-06 DIAGNOSIS — N2581 Secondary hyperparathyroidism of renal origin: Secondary | ICD-10-CM | POA: Diagnosis not present

## 2023-05-06 DIAGNOSIS — R52 Pain, unspecified: Secondary | ICD-10-CM | POA: Diagnosis not present

## 2023-05-06 DIAGNOSIS — E1129 Type 2 diabetes mellitus with other diabetic kidney complication: Secondary | ICD-10-CM | POA: Diagnosis not present

## 2023-05-08 DIAGNOSIS — Z992 Dependence on renal dialysis: Secondary | ICD-10-CM | POA: Diagnosis not present

## 2023-05-08 DIAGNOSIS — E1129 Type 2 diabetes mellitus with other diabetic kidney complication: Secondary | ICD-10-CM | POA: Diagnosis not present

## 2023-05-08 DIAGNOSIS — N2581 Secondary hyperparathyroidism of renal origin: Secondary | ICD-10-CM | POA: Diagnosis not present

## 2023-05-08 DIAGNOSIS — D631 Anemia in chronic kidney disease: Secondary | ICD-10-CM | POA: Diagnosis not present

## 2023-05-08 DIAGNOSIS — N186 End stage renal disease: Secondary | ICD-10-CM | POA: Diagnosis not present

## 2023-05-08 DIAGNOSIS — R52 Pain, unspecified: Secondary | ICD-10-CM | POA: Diagnosis not present

## 2023-05-10 DIAGNOSIS — Z992 Dependence on renal dialysis: Secondary | ICD-10-CM | POA: Diagnosis not present

## 2023-05-10 DIAGNOSIS — D631 Anemia in chronic kidney disease: Secondary | ICD-10-CM | POA: Diagnosis not present

## 2023-05-10 DIAGNOSIS — N2581 Secondary hyperparathyroidism of renal origin: Secondary | ICD-10-CM | POA: Diagnosis not present

## 2023-05-10 DIAGNOSIS — N186 End stage renal disease: Secondary | ICD-10-CM | POA: Diagnosis not present

## 2023-05-10 DIAGNOSIS — R52 Pain, unspecified: Secondary | ICD-10-CM | POA: Diagnosis not present

## 2023-05-10 DIAGNOSIS — E1129 Type 2 diabetes mellitus with other diabetic kidney complication: Secondary | ICD-10-CM | POA: Diagnosis not present

## 2023-05-13 DIAGNOSIS — N2581 Secondary hyperparathyroidism of renal origin: Secondary | ICD-10-CM | POA: Diagnosis not present

## 2023-05-13 DIAGNOSIS — E1129 Type 2 diabetes mellitus with other diabetic kidney complication: Secondary | ICD-10-CM | POA: Diagnosis not present

## 2023-05-13 DIAGNOSIS — N186 End stage renal disease: Secondary | ICD-10-CM | POA: Diagnosis not present

## 2023-05-13 DIAGNOSIS — R52 Pain, unspecified: Secondary | ICD-10-CM | POA: Diagnosis not present

## 2023-05-13 DIAGNOSIS — Z992 Dependence on renal dialysis: Secondary | ICD-10-CM | POA: Diagnosis not present

## 2023-05-13 DIAGNOSIS — D631 Anemia in chronic kidney disease: Secondary | ICD-10-CM | POA: Diagnosis not present

## 2023-05-15 DIAGNOSIS — E1129 Type 2 diabetes mellitus with other diabetic kidney complication: Secondary | ICD-10-CM | POA: Diagnosis not present

## 2023-05-15 DIAGNOSIS — D631 Anemia in chronic kidney disease: Secondary | ICD-10-CM | POA: Diagnosis not present

## 2023-05-15 DIAGNOSIS — Z992 Dependence on renal dialysis: Secondary | ICD-10-CM | POA: Diagnosis not present

## 2023-05-15 DIAGNOSIS — N186 End stage renal disease: Secondary | ICD-10-CM | POA: Diagnosis not present

## 2023-05-15 DIAGNOSIS — R52 Pain, unspecified: Secondary | ICD-10-CM | POA: Diagnosis not present

## 2023-05-15 DIAGNOSIS — N2581 Secondary hyperparathyroidism of renal origin: Secondary | ICD-10-CM | POA: Diagnosis not present

## 2023-05-17 DIAGNOSIS — R52 Pain, unspecified: Secondary | ICD-10-CM | POA: Diagnosis not present

## 2023-05-17 DIAGNOSIS — N2581 Secondary hyperparathyroidism of renal origin: Secondary | ICD-10-CM | POA: Diagnosis not present

## 2023-05-17 DIAGNOSIS — Z992 Dependence on renal dialysis: Secondary | ICD-10-CM | POA: Diagnosis not present

## 2023-05-17 DIAGNOSIS — E1129 Type 2 diabetes mellitus with other diabetic kidney complication: Secondary | ICD-10-CM | POA: Diagnosis not present

## 2023-05-17 DIAGNOSIS — N186 End stage renal disease: Secondary | ICD-10-CM | POA: Diagnosis not present

## 2023-05-17 DIAGNOSIS — D631 Anemia in chronic kidney disease: Secondary | ICD-10-CM | POA: Diagnosis not present

## 2023-05-20 DIAGNOSIS — R52 Pain, unspecified: Secondary | ICD-10-CM | POA: Diagnosis not present

## 2023-05-20 DIAGNOSIS — N2581 Secondary hyperparathyroidism of renal origin: Secondary | ICD-10-CM | POA: Diagnosis not present

## 2023-05-20 DIAGNOSIS — D631 Anemia in chronic kidney disease: Secondary | ICD-10-CM | POA: Diagnosis not present

## 2023-05-20 DIAGNOSIS — Z992 Dependence on renal dialysis: Secondary | ICD-10-CM | POA: Diagnosis not present

## 2023-05-20 DIAGNOSIS — N186 End stage renal disease: Secondary | ICD-10-CM | POA: Diagnosis not present

## 2023-05-20 DIAGNOSIS — E1129 Type 2 diabetes mellitus with other diabetic kidney complication: Secondary | ICD-10-CM | POA: Diagnosis not present

## 2023-05-22 ENCOUNTER — Telehealth: Payer: Self-pay

## 2023-05-22 DIAGNOSIS — R52 Pain, unspecified: Secondary | ICD-10-CM | POA: Diagnosis not present

## 2023-05-22 DIAGNOSIS — Z992 Dependence on renal dialysis: Secondary | ICD-10-CM | POA: Diagnosis not present

## 2023-05-22 DIAGNOSIS — E1129 Type 2 diabetes mellitus with other diabetic kidney complication: Secondary | ICD-10-CM | POA: Diagnosis not present

## 2023-05-22 DIAGNOSIS — D631 Anemia in chronic kidney disease: Secondary | ICD-10-CM | POA: Diagnosis not present

## 2023-05-22 DIAGNOSIS — N2581 Secondary hyperparathyroidism of renal origin: Secondary | ICD-10-CM | POA: Diagnosis not present

## 2023-05-22 DIAGNOSIS — N186 End stage renal disease: Secondary | ICD-10-CM | POA: Diagnosis not present

## 2023-05-22 NOTE — Telephone Encounter (Signed)
 Left VM to schedule diabetic shoe pick up

## 2023-05-23 DIAGNOSIS — Z992 Dependence on renal dialysis: Secondary | ICD-10-CM | POA: Diagnosis not present

## 2023-05-23 DIAGNOSIS — N186 End stage renal disease: Secondary | ICD-10-CM | POA: Diagnosis not present

## 2023-05-23 DIAGNOSIS — E1122 Type 2 diabetes mellitus with diabetic chronic kidney disease: Secondary | ICD-10-CM | POA: Diagnosis not present

## 2023-05-24 DIAGNOSIS — R52 Pain, unspecified: Secondary | ICD-10-CM | POA: Diagnosis not present

## 2023-05-24 DIAGNOSIS — E1129 Type 2 diabetes mellitus with other diabetic kidney complication: Secondary | ICD-10-CM | POA: Diagnosis not present

## 2023-05-24 DIAGNOSIS — N2581 Secondary hyperparathyroidism of renal origin: Secondary | ICD-10-CM | POA: Diagnosis not present

## 2023-05-24 DIAGNOSIS — D631 Anemia in chronic kidney disease: Secondary | ICD-10-CM | POA: Diagnosis not present

## 2023-05-24 DIAGNOSIS — Z992 Dependence on renal dialysis: Secondary | ICD-10-CM | POA: Diagnosis not present

## 2023-05-24 DIAGNOSIS — N186 End stage renal disease: Secondary | ICD-10-CM | POA: Diagnosis not present

## 2023-05-27 ENCOUNTER — Telehealth (HOSPITAL_COMMUNITY): Payer: Self-pay | Admitting: *Deleted

## 2023-05-27 DIAGNOSIS — D631 Anemia in chronic kidney disease: Secondary | ICD-10-CM | POA: Diagnosis not present

## 2023-05-27 DIAGNOSIS — R52 Pain, unspecified: Secondary | ICD-10-CM | POA: Diagnosis not present

## 2023-05-27 DIAGNOSIS — N186 End stage renal disease: Secondary | ICD-10-CM | POA: Diagnosis not present

## 2023-05-27 DIAGNOSIS — E1129 Type 2 diabetes mellitus with other diabetic kidney complication: Secondary | ICD-10-CM | POA: Diagnosis not present

## 2023-05-27 DIAGNOSIS — N2581 Secondary hyperparathyroidism of renal origin: Secondary | ICD-10-CM | POA: Diagnosis not present

## 2023-05-27 DIAGNOSIS — Z992 Dependence on renal dialysis: Secondary | ICD-10-CM | POA: Diagnosis not present

## 2023-05-27 NOTE — Telephone Encounter (Signed)
 Called patient and left message reminding him of appointment to see Dr. Elwyn Lade in [facility] tomorrow at 3:00. Asked patient to call us if he cannot make appointment.

## 2023-05-28 ENCOUNTER — Ambulatory Visit (HOSPITAL_BASED_OUTPATIENT_CLINIC_OR_DEPARTMENT_OTHER)
Admission: RE | Admit: 2023-05-28 | Discharge: 2023-05-28 | Disposition: A | Discharge status: Home / Self Care | Source: Ambulatory Visit | Attending: Cardiology | Admitting: Cardiology

## 2023-05-28 ENCOUNTER — Ambulatory Visit (HOSPITAL_COMMUNITY)
Admission: RE | Admit: 2023-05-28 | Discharge: 2023-05-28 | Disposition: A | Discharge status: Home / Self Care | Payer: Medicare Other | Source: Ambulatory Visit | Attending: Cardiology | Admitting: Cardiology

## 2023-05-28 ENCOUNTER — Encounter (HOSPITAL_COMMUNITY): Payer: Self-pay | Admitting: Cardiology

## 2023-05-28 ENCOUNTER — Ambulatory Visit (HOSPITAL_COMMUNITY): Admission: RE | Admit: 2023-05-28 | Payer: Medicare Other | Source: Ambulatory Visit

## 2023-05-28 VITALS — BP 110/60 | HR 78 | Wt 221.6 lb

## 2023-05-28 DIAGNOSIS — Z79899 Other long term (current) drug therapy: Secondary | ICD-10-CM | POA: Insufficient documentation

## 2023-05-28 DIAGNOSIS — I5020 Unspecified systolic (congestive) heart failure: Secondary | ICD-10-CM

## 2023-05-28 DIAGNOSIS — I428 Other cardiomyopathies: Secondary | ICD-10-CM | POA: Diagnosis not present

## 2023-05-28 DIAGNOSIS — N186 End stage renal disease: Secondary | ICD-10-CM | POA: Insufficient documentation

## 2023-05-28 DIAGNOSIS — E1122 Type 2 diabetes mellitus with diabetic chronic kidney disease: Secondary | ICD-10-CM | POA: Insufficient documentation

## 2023-05-28 DIAGNOSIS — I132 Hypertensive heart and chronic kidney disease with heart failure and with stage 5 chronic kidney disease, or end stage renal disease: Secondary | ICD-10-CM | POA: Diagnosis not present

## 2023-05-28 DIAGNOSIS — I5022 Chronic systolic (congestive) heart failure: Secondary | ICD-10-CM | POA: Insufficient documentation

## 2023-05-28 LAB — ECHOCARDIOGRAM COMPLETE
AR max vel: 2.03 cm2
AV Area VTI: 2.04 cm2
AV Area mean vel: 1.97 cm2
AV Mean grad: 4 mmHg
AV Peak grad: 6.1 mmHg
Ao pk vel: 1.23 m/s
Area-P 1/2: 2.74 cm2
Calc EF: 61 %
MV VTI: 3.38 cm2
S' Lateral: 3.7 cm
Single Plane A2C EF: 66 %
Single Plane A4C EF: 54.7 %
Weight: 3545.6 [oz_av]

## 2023-05-28 MED ORDER — METOPROLOL SUCCINATE ER 50 MG PO TB24
50.0000 mg | ORAL_TABLET | Freq: Every day | ORAL | 5 refills | Status: DC
Start: 2023-05-28 — End: 2023-11-21

## 2023-05-28 NOTE — Progress Notes (Signed)
  Echocardiogram 2D Echocardiogram has been performed.  Ocie Doyne RDCS 05/28/2023, 4:31 PM

## 2023-05-28 NOTE — Progress Notes (Signed)
   ADVANCED HEART FAILURE FOLLOW UP CLINIC NOTE  Referring Physician: Georgianne Fick, MD  Primary Care: Georgianne Fick, MD Primary Cardiologist:  HPI: Jonathan Mcneil is a 68 y.o. male who presents for follow up of chronic systolic heart failure.      Patient has a long history of ESRD.  He was worked up at [facility] and underwent a left heart cath that showed normal coronaries, remainder of his workup was negative.  Per report echo was consistent with mild LVH, diminished E prime velocities and low voltage EKG.  He did not undergo further testing, but his [name] has had to be reduced over the past few months.  He has been [facility] for [facility] at both [facility] and [facility] due to his cardiac function.       SUBJECTIVE:  Patient overall states that he is doing fairly well.  He has had no issues with his medications since his last visit.  He has been completing all his dialysis sessions without any will stop early due to blood pressure or symptoms.  He denies any symptoms of exertional shortness of breath and is hopeful that his heart function has improved.  PMH, current medications, allergies, social history, and family history reviewed in epic.  PHYSICAL EXAM: Vitals:   05/28/23 1506  BP: 110/60  Pulse: 78  SpO2: 99%   GENERAL: Well nourished and in no apparent distress at rest.  PULM:  Normal work of breathing, clear to auscultation bilaterally. Respirations are unlabored.  CARDIAC:  JVP: Flat         Normal rate with regular rhythm. No murmurs, rubs or gallops.  No edema. Warm and well perfused extremities. ABDOMEN: Soft, non-tender, non-distended. NEUROLOGIC: Patient is oriented x3 with no focal or lateralizing neurologic deficits.    DATA REVIEW  ECHO: 05/2022: LVEF 30%, mild LVH with LV dilation.  Grade 2 diastolic dysfunction, moderately dilated RV with severe TR.   CATH: LHC 07/09/2021 - No significant coronary artery disease - Large main branch coronary  arteries - Elevated LVEDP       Heart failure review: - Classification: Heart failure with reduced EF - Etiology: Work up ongoing - NYHA Class: II     ASSESSMENT & PLAN:   Heart failure with reduced EF: Nonischemic, most likely etiology is longstanding hypertension.  Apparently some concern for infiltrative disease, however given ESRD is not a tafamidis candidate.  Echocardiogram significantly improved to 50 to 55%.  Will increase metoprolol given rhythm on heart rate.  She has not had any cardiac barriers to renal transplant evaluations.  He is not taking midodrine anymore. - Repeat echocardiogram reviewed, EF significantly improved -Continue Entresto 24/26 mg twice daily -Increase metoprolol to 50 mg daily -Not having to use midodrine -Not an advanced therapies candidate   ESRD: T/Th/S HD. - Continue follow up with nephrology   HTN: Well controlled today. - therapy as above    Follow up in 3 months  Clearnce Hasten, MD Advanced Heart Failure Mechanical Circulatory Support 05/28/23

## 2023-05-28 NOTE — Patient Instructions (Signed)
 Good to see you today!    INCREASE Metoprolol to 50 mg ( 1 tablet) daily  Your physician recommends that you schedule a follow-up appointment in: 3 months( June) Call office in April to schedule an appointment  If you have any questions or concerns before your next appointment please send [place] a message through [place] or call our office at [phone].    TO LEAVE A MESSAGE FOR THE NURSE SELECT OPTION 2, PLEASE LEAVE A MESSAGE INCLUDING: YOUR NAME DATE OF BIRTH CALL BACK NUMBER REASON FOR CALL**this is important as we prioritize the call backs  YOU WILL RECEIVE A CALL BACK THE SAME DAY AS LONG AS YOU CALL BEFORE 4:00 PM  At the Advanced [facility] Failure [facility], you and your health needs are our priority. As part of our continuing mission to provide you with exceptional heart care, we have created designated Provider Care Teams. These Care Teams include your primary Cardiologist (physician) and Advanced Practice Providers (APPs- Physician Assistants and Nurse Practitioners) who all work together to provide you with the care you need, when you need it.   You may see any of the following providers on your designated Care Team at your next follow up: Dr Arvilla Meres Dr Marca Ancona Dr. Dorthula Nettles Dr. Clearnce Hasten Amy Filbert Schilder, NP Robbie Lis, [place] [facility] [place], [place] Brynda Peon, NP [place] Lee, NP Clarisa Kindred, NP Karle Plumber, PharmD Enos Fling, PharmD   Please be sure to bring in all your medications bottles to every appointment.    Thank you for choosing [facility]

## 2023-05-29 ENCOUNTER — Encounter (HOSPITAL_COMMUNITY): Payer: Self-pay | Admitting: Cardiology

## 2023-05-29 DIAGNOSIS — Z992 Dependence on renal dialysis: Secondary | ICD-10-CM | POA: Diagnosis not present

## 2023-05-29 DIAGNOSIS — N186 End stage renal disease: Secondary | ICD-10-CM | POA: Diagnosis not present

## 2023-05-29 DIAGNOSIS — E1129 Type 2 diabetes mellitus with other diabetic kidney complication: Secondary | ICD-10-CM | POA: Diagnosis not present

## 2023-05-29 DIAGNOSIS — N2581 Secondary hyperparathyroidism of renal origin: Secondary | ICD-10-CM | POA: Diagnosis not present

## 2023-05-29 DIAGNOSIS — D631 Anemia in chronic kidney disease: Secondary | ICD-10-CM | POA: Diagnosis not present

## 2023-05-29 DIAGNOSIS — R52 Pain, unspecified: Secondary | ICD-10-CM | POA: Diagnosis not present

## 2023-05-31 DIAGNOSIS — D631 Anemia in chronic kidney disease: Secondary | ICD-10-CM | POA: Diagnosis not present

## 2023-05-31 DIAGNOSIS — Z992 Dependence on renal dialysis: Secondary | ICD-10-CM | POA: Diagnosis not present

## 2023-05-31 DIAGNOSIS — N186 End stage renal disease: Secondary | ICD-10-CM | POA: Diagnosis not present

## 2023-05-31 DIAGNOSIS — N2581 Secondary hyperparathyroidism of renal origin: Secondary | ICD-10-CM | POA: Diagnosis not present

## 2023-05-31 DIAGNOSIS — R52 Pain, unspecified: Secondary | ICD-10-CM | POA: Diagnosis not present

## 2023-05-31 DIAGNOSIS — E1129 Type 2 diabetes mellitus with other diabetic kidney complication: Secondary | ICD-10-CM | POA: Diagnosis not present

## 2023-06-03 DIAGNOSIS — Z992 Dependence on renal dialysis: Secondary | ICD-10-CM | POA: Diagnosis not present

## 2023-06-03 DIAGNOSIS — R52 Pain, unspecified: Secondary | ICD-10-CM | POA: Diagnosis not present

## 2023-06-03 DIAGNOSIS — E1129 Type 2 diabetes mellitus with other diabetic kidney complication: Secondary | ICD-10-CM | POA: Diagnosis not present

## 2023-06-03 DIAGNOSIS — N186 End stage renal disease: Secondary | ICD-10-CM | POA: Diagnosis not present

## 2023-06-03 DIAGNOSIS — N2581 Secondary hyperparathyroidism of renal origin: Secondary | ICD-10-CM | POA: Diagnosis not present

## 2023-06-03 DIAGNOSIS — D631 Anemia in chronic kidney disease: Secondary | ICD-10-CM | POA: Diagnosis not present

## 2023-06-04 DIAGNOSIS — N2581 Secondary hyperparathyroidism of renal origin: Secondary | ICD-10-CM | POA: Diagnosis not present

## 2023-06-04 DIAGNOSIS — I132 Hypertensive heart and chronic kidney disease with heart failure and with stage 5 chronic kidney disease, or end stage renal disease: Secondary | ICD-10-CM | POA: Diagnosis not present

## 2023-06-04 DIAGNOSIS — I129 Hypertensive chronic kidney disease with stage 1 through stage 4 chronic kidney disease, or unspecified chronic kidney disease: Secondary | ICD-10-CM | POA: Diagnosis not present

## 2023-06-04 DIAGNOSIS — M150 Primary generalized (osteo)arthritis: Secondary | ICD-10-CM | POA: Diagnosis not present

## 2023-06-04 DIAGNOSIS — I340 Nonrheumatic mitral (valve) insufficiency: Secondary | ICD-10-CM | POA: Diagnosis not present

## 2023-06-04 DIAGNOSIS — N186 End stage renal disease: Secondary | ICD-10-CM | POA: Diagnosis not present

## 2023-06-04 DIAGNOSIS — N185 Chronic kidney disease, stage 5: Secondary | ICD-10-CM | POA: Diagnosis not present

## 2023-06-04 DIAGNOSIS — E1165 Type 2 diabetes mellitus with hyperglycemia: Secondary | ICD-10-CM | POA: Diagnosis not present

## 2023-06-04 DIAGNOSIS — I5022 Chronic systolic (congestive) heart failure: Secondary | ICD-10-CM | POA: Diagnosis not present

## 2023-06-04 DIAGNOSIS — Z992 Dependence on renal dialysis: Secondary | ICD-10-CM | POA: Diagnosis not present

## 2023-06-04 DIAGNOSIS — E782 Mixed hyperlipidemia: Secondary | ICD-10-CM | POA: Diagnosis not present

## 2023-06-05 DIAGNOSIS — E1129 Type 2 diabetes mellitus with other diabetic kidney complication: Secondary | ICD-10-CM | POA: Diagnosis not present

## 2023-06-05 DIAGNOSIS — D631 Anemia in chronic kidney disease: Secondary | ICD-10-CM | POA: Diagnosis not present

## 2023-06-05 DIAGNOSIS — N186 End stage renal disease: Secondary | ICD-10-CM | POA: Diagnosis not present

## 2023-06-05 DIAGNOSIS — R52 Pain, unspecified: Secondary | ICD-10-CM | POA: Diagnosis not present

## 2023-06-05 DIAGNOSIS — N2581 Secondary hyperparathyroidism of renal origin: Secondary | ICD-10-CM | POA: Diagnosis not present

## 2023-06-05 DIAGNOSIS — Z992 Dependence on renal dialysis: Secondary | ICD-10-CM | POA: Diagnosis not present

## 2023-06-07 DIAGNOSIS — E1129 Type 2 diabetes mellitus with other diabetic kidney complication: Secondary | ICD-10-CM | POA: Diagnosis not present

## 2023-06-07 DIAGNOSIS — N186 End stage renal disease: Secondary | ICD-10-CM | POA: Diagnosis not present

## 2023-06-07 DIAGNOSIS — R52 Pain, unspecified: Secondary | ICD-10-CM | POA: Diagnosis not present

## 2023-06-07 DIAGNOSIS — N2581 Secondary hyperparathyroidism of renal origin: Secondary | ICD-10-CM | POA: Diagnosis not present

## 2023-06-07 DIAGNOSIS — Z992 Dependence on renal dialysis: Secondary | ICD-10-CM | POA: Diagnosis not present

## 2023-06-07 DIAGNOSIS — D631 Anemia in chronic kidney disease: Secondary | ICD-10-CM | POA: Diagnosis not present

## 2023-06-10 DIAGNOSIS — Z992 Dependence on renal dialysis: Secondary | ICD-10-CM | POA: Diagnosis not present

## 2023-06-10 DIAGNOSIS — D631 Anemia in chronic kidney disease: Secondary | ICD-10-CM | POA: Diagnosis not present

## 2023-06-10 DIAGNOSIS — R52 Pain, unspecified: Secondary | ICD-10-CM | POA: Diagnosis not present

## 2023-06-10 DIAGNOSIS — E1129 Type 2 diabetes mellitus with other diabetic kidney complication: Secondary | ICD-10-CM | POA: Diagnosis not present

## 2023-06-10 DIAGNOSIS — N186 End stage renal disease: Secondary | ICD-10-CM | POA: Diagnosis not present

## 2023-06-10 DIAGNOSIS — N2581 Secondary hyperparathyroidism of renal origin: Secondary | ICD-10-CM | POA: Diagnosis not present

## 2023-06-12 DIAGNOSIS — E1129 Type 2 diabetes mellitus with other diabetic kidney complication: Secondary | ICD-10-CM | POA: Diagnosis not present

## 2023-06-12 DIAGNOSIS — Z992 Dependence on renal dialysis: Secondary | ICD-10-CM | POA: Diagnosis not present

## 2023-06-12 DIAGNOSIS — D631 Anemia in chronic kidney disease: Secondary | ICD-10-CM | POA: Diagnosis not present

## 2023-06-12 DIAGNOSIS — N2581 Secondary hyperparathyroidism of renal origin: Secondary | ICD-10-CM | POA: Diagnosis not present

## 2023-06-12 DIAGNOSIS — R52 Pain, unspecified: Secondary | ICD-10-CM | POA: Diagnosis not present

## 2023-06-12 DIAGNOSIS — N186 End stage renal disease: Secondary | ICD-10-CM | POA: Diagnosis not present

## 2023-06-14 DIAGNOSIS — N186 End stage renal disease: Secondary | ICD-10-CM | POA: Diagnosis not present

## 2023-06-14 DIAGNOSIS — Z992 Dependence on renal dialysis: Secondary | ICD-10-CM | POA: Diagnosis not present

## 2023-06-14 DIAGNOSIS — R52 Pain, unspecified: Secondary | ICD-10-CM | POA: Diagnosis not present

## 2023-06-14 DIAGNOSIS — D631 Anemia in chronic kidney disease: Secondary | ICD-10-CM | POA: Diagnosis not present

## 2023-06-14 DIAGNOSIS — N2581 Secondary hyperparathyroidism of renal origin: Secondary | ICD-10-CM | POA: Diagnosis not present

## 2023-06-14 DIAGNOSIS — E1129 Type 2 diabetes mellitus with other diabetic kidney complication: Secondary | ICD-10-CM | POA: Diagnosis not present

## 2023-06-17 DIAGNOSIS — D631 Anemia in chronic kidney disease: Secondary | ICD-10-CM | POA: Diagnosis not present

## 2023-06-17 DIAGNOSIS — R52 Pain, unspecified: Secondary | ICD-10-CM | POA: Diagnosis not present

## 2023-06-17 DIAGNOSIS — E1129 Type 2 diabetes mellitus with other diabetic kidney complication: Secondary | ICD-10-CM | POA: Diagnosis not present

## 2023-06-17 DIAGNOSIS — N186 End stage renal disease: Secondary | ICD-10-CM | POA: Diagnosis not present

## 2023-06-17 DIAGNOSIS — Z992 Dependence on renal dialysis: Secondary | ICD-10-CM | POA: Diagnosis not present

## 2023-06-17 DIAGNOSIS — N2581 Secondary hyperparathyroidism of renal origin: Secondary | ICD-10-CM | POA: Diagnosis not present

## 2023-06-19 DIAGNOSIS — Z992 Dependence on renal dialysis: Secondary | ICD-10-CM | POA: Diagnosis not present

## 2023-06-19 DIAGNOSIS — N186 End stage renal disease: Secondary | ICD-10-CM | POA: Diagnosis not present

## 2023-06-19 DIAGNOSIS — R52 Pain, unspecified: Secondary | ICD-10-CM | POA: Diagnosis not present

## 2023-06-19 DIAGNOSIS — E1129 Type 2 diabetes mellitus with other diabetic kidney complication: Secondary | ICD-10-CM | POA: Diagnosis not present

## 2023-06-19 DIAGNOSIS — D631 Anemia in chronic kidney disease: Secondary | ICD-10-CM | POA: Diagnosis not present

## 2023-06-19 DIAGNOSIS — N2581 Secondary hyperparathyroidism of renal origin: Secondary | ICD-10-CM | POA: Diagnosis not present

## 2023-06-20 ENCOUNTER — Ambulatory Visit (INDEPENDENT_AMBULATORY_CARE_PROVIDER_SITE_OTHER): Payer: Medicare Other

## 2023-06-20 DIAGNOSIS — M2021 Hallux rigidus, right foot: Secondary | ICD-10-CM

## 2023-06-20 DIAGNOSIS — M2141 Flat foot [pes planus] (acquired), right foot: Secondary | ICD-10-CM | POA: Diagnosis not present

## 2023-06-20 DIAGNOSIS — M2142 Flat foot [pes planus] (acquired), left foot: Secondary | ICD-10-CM

## 2023-06-20 DIAGNOSIS — E1149 Type 2 diabetes mellitus with other diabetic neurological complication: Secondary | ICD-10-CM | POA: Diagnosis not present

## 2023-06-20 NOTE — Progress Notes (Signed)

## 2023-06-21 DIAGNOSIS — E1129 Type 2 diabetes mellitus with other diabetic kidney complication: Secondary | ICD-10-CM | POA: Diagnosis not present

## 2023-06-21 DIAGNOSIS — Z992 Dependence on renal dialysis: Secondary | ICD-10-CM | POA: Diagnosis not present

## 2023-06-21 DIAGNOSIS — R52 Pain, unspecified: Secondary | ICD-10-CM | POA: Diagnosis not present

## 2023-06-21 DIAGNOSIS — N2581 Secondary hyperparathyroidism of renal origin: Secondary | ICD-10-CM | POA: Diagnosis not present

## 2023-06-21 DIAGNOSIS — D631 Anemia in chronic kidney disease: Secondary | ICD-10-CM | POA: Diagnosis not present

## 2023-06-21 DIAGNOSIS — N186 End stage renal disease: Secondary | ICD-10-CM | POA: Diagnosis not present

## 2023-06-23 DIAGNOSIS — N186 End stage renal disease: Secondary | ICD-10-CM | POA: Diagnosis not present

## 2023-06-23 DIAGNOSIS — E1122 Type 2 diabetes mellitus with diabetic chronic kidney disease: Secondary | ICD-10-CM | POA: Diagnosis not present

## 2023-06-23 DIAGNOSIS — Z992 Dependence on renal dialysis: Secondary | ICD-10-CM | POA: Diagnosis not present

## 2023-06-24 DIAGNOSIS — Z992 Dependence on renal dialysis: Secondary | ICD-10-CM | POA: Diagnosis not present

## 2023-06-24 DIAGNOSIS — R197 Diarrhea, unspecified: Secondary | ICD-10-CM | POA: Diagnosis not present

## 2023-06-24 DIAGNOSIS — N2581 Secondary hyperparathyroidism of renal origin: Secondary | ICD-10-CM | POA: Diagnosis not present

## 2023-06-24 DIAGNOSIS — R52 Pain, unspecified: Secondary | ICD-10-CM | POA: Diagnosis not present

## 2023-06-24 DIAGNOSIS — N186 End stage renal disease: Secondary | ICD-10-CM | POA: Diagnosis not present

## 2023-06-24 DIAGNOSIS — D631 Anemia in chronic kidney disease: Secondary | ICD-10-CM | POA: Diagnosis not present

## 2023-06-24 DIAGNOSIS — E1129 Type 2 diabetes mellitus with other diabetic kidney complication: Secondary | ICD-10-CM | POA: Diagnosis not present

## 2023-06-24 NOTE — Progress Notes (Signed)
 [facility] - Clinic Note  06/25/2023     CHIEF COMPLAINT Patient presents for Retina Follow Up  HISTORY OF PRESENT ILLNESS: Jonathan Mcneil is a 68 y.o. male who presents to the clinic today for:   HPI     Retina Follow Up   Patient presents with  Diabetic Retinopathy.  In both eyes.  Duration of weeks.  I, the attending physician,  performed the HPI with the patient and updated documentation appropriately.        Comments   Patient here for  weeks retina follow up for PDR OU. Patient states vision doing better but he is still noticing black spots  in his right eye. Pt's last A1c was 7.0 in March of 2025. Pt does not monitor blood glucose. Pt is on dialysis 3 days per week.      Last edited by Rennis Chris, MD on 06/25/2023 12:28 PM.    Pt states    Referring physician: Georgianne Fick, MD [address] [place],  [place] [address]  HISTORICAL INFORMATION:   Selected notes from the MEDICAL RECORD NUMBER Referred by Dr. Aletta Edouard for heme OD   CURRENT MEDICATIONS: No current outpatient medications on file. (Ophthalmic Drugs)   No current facility-administered medications for this visit. (Ophthalmic Drugs)   Current Outpatient Medications (Other)  Medication Sig   atorvastatin (LIPITOR) 20 MG tablet Take 20 mg by mouth daily.   ENTRESTO 24-26 MG Take 1 tablet by mouth 2 (two) times daily.   gabapentin (NEURONTIN) 300 MG capsule Take 300 mg by mouth at bedtime.   loperamide (IMODIUM) 2 MG capsule Take 1 capsule (2 mg total) by mouth 4 (four) times daily as needed for diarrhea or loose stools.   methocarbamol (ROBAXIN) 500 MG tablet Take 1 tablet (500 mg total) by mouth every 8 (eight) hours as needed for muscle spasms.   metoprolol succinate (TOPROL-XL) 50 MG 24 hr tablet Take 1 tablet (50 mg total) by mouth daily.   ondansetron (ZOFRAN-ODT) 4 MG disintegrating tablet Take 1 tablet (4 mg total) by mouth every 8 (eight) hours as needed  for nausea or vomiting.   pantoprazole (PROTONIX) 40 MG tablet Take 1 tablet (40 mg total) by mouth 2 (two) times daily before a meal. Twice a day x 4 weeks, then daily   sertraline (ZOLOFT) 25 MG tablet Take 25 mg by mouth 3 (three) times a week. M W F   sucroferric oxyhydroxide (VELPHORO) 500 MG chewable tablet Chew 500 mg by mouth 3 (three) times daily with meals.   traMADol (ULTRAM) 50 MG tablet Take 25 mg by mouth as needed. 3 x weekly after dialysis as needed for pain   No current facility-administered medications for this visit. (Other)   REVIEW OF SYSTEMS:    ALLERGIES Allergies  Allergen Reactions   Gabapentin Nausea Only   Ibuprofen Hives   Penicillins Itching and Other (See Comments)    Has patient had a PCN reaction causing immediate rash, facial/tongue/throat swelling, SOB or lightheadedness with hypotension: No Has patient had a PCN reaction causing severe rash involving mucus membranes or skin necrosis: No Has patient had a PCN reaction that required hospitalization No Has patient had a PCN reaction occurring within the last 10 years: Unknown If all of the above answers are "NO", then may proceed with Cephalosporin use.   Venlafaxine Other (See Comments)    insomnia   PAST MEDICAL HISTORY Past Medical History:  Diagnosis Date  Cataract    Diabetes mellitus without complication (HCC)    Diabetic retinopathy (HCC)    ESRD (end stage renal disease) (HCC)    GERD (gastroesophageal reflux disease)    PMH   Hypertension    Hypertensive retinopathy    Low back pain    Lung disease    Metabolic acidosis    Neuromuscular disorder (HCC)    peripheral neuropathy   Pancreatitis    Schizophrenia (HCC)    does not take medications   Past Surgical History:  Procedure Laterality Date   A/V FISTULAGRAM Left 06/03/2018   Procedure: A/V FISTULAGRAM;  Surgeon: Cephus Shelling, MD;  Location: [facility] INVASIVE CV LAB;  Service: Cardiovascular;  Laterality: Left;   AV  FISTULA PLACEMENT Left 02/11/2018   Procedure: INSERTION OF ARTERIOVENOUS (AV) FISTULA LEFT  ARM;  Surgeon: Maeola Harman, MD;  Location[facility] OR;  Service: Vascular;  Laterality: Left;   BASCILIC VEIN TRANSPOSITION Left 04/17/2018   Procedure: BASILIC VEIN TRANSPOSITION SECOND STAGE;  Surgeon: Maeola Harman, MD;  Location: [facility] OR;  Service: Vascular;  Laterality: Left;   BIOPSY  10/20/2019   Procedure: BIOPSY;  Surgeon: Vida Rigger, MD;  Location: [facility] ENDOSCOPY;  Service: Endoscopy;;   COLONOSCOPY     DENTAL SURGERY     ESOPHAGOGASTRODUODENOSCOPY N/A 06/16/2021   Procedure: ESOPHAGOGASTRODUODENOSCOPY (EGD);  Surgeon: Charlott Rakes, MD;  Location: [facility] ENDOSCOPY;  Service: Gastroenterology;  Laterality: N/A;   ESOPHAGOGASTRODUODENOSCOPY (EGD) WITH PROPOFOL N/A 10/20/2019   Procedure: ESOPHAGOGASTRODUODENOSCOPY (EGD) WITH PROPOFOL;  Surgeon: Vida Rigger, MD;  Location: [facility] ENDOSCOPY;  Service: Endoscopy;  Laterality: N/A;   INSERTION OF DIALYSIS CATHETER Right 02/25/2018   Procedure: INSERTION OF DIALYSIS CATHETER;  Surgeon: Maeola Harman, MD;  Location: [facility] OR;  Service: Vascular;  Laterality: Right;   PERIPHERAL VASCULAR BALLOON ANGIOPLASTY  06/03/2018   Procedure: PERIPHERAL VASCULAR BALLOON ANGIOPLASTY;  Surgeon: Cephus Shelling, MD;  Location: [facility] INVASIVE CV LAB;  Service: Cardiovascular;;  left a/v fistula   FAMILY HISTORY Family History  Problem Relation Age of Onset   Kidney failure Mother    Diabetes Father    Blindness Brother    SOCIAL HISTORY Social History   Tobacco Use   Smoking status: Former   Smokeless tobacco: Never  Advertising account planner   Vaping status: Never Used  Substance Use Topics   Alcohol use: No   Drug use: Not Currently    Types: Cocaine, Marijuana    Comment: smoked marijuana a few days ago; he denies using cocaine       OPHTHALMIC EXAM:  Base Eye Exam     Visual Acuity (Snellen - Linear)       Right Left   Dist [place] 20/25  -2 20/20 -1   Dist ph [place] 20/25 +1          Tonometry (Tonopen, 8:45 AM)       Right Left   Pressure 12 16         Pupils       Dark Light Shape React APD   Right 3 2 Round Brisk None   Left 3 2 Round Brisk None         Visual Fields       Left Right    Full Full         Extraocular Movement       Right Left    Full, Ortho Full, Ortho         Neuro/Psych  Oriented x3: Yes   Mood/Affect: Normal         Dilation     Both eyes: 1.0% Mydriacyl, 2.5% Phenylephrine @ 8:45 AM           Slit Lamp and Fundus Exam     Slit Lamp Exam       Right Left   Lids/Lashes Dermatochalasis - upper lid Dermatochalasis - upper lid   Conjunctiva/Sclera Melanosis Melanosis, temporal pinguecula   Cornea Mild arcus, well healed cataract wound arcus   Anterior Chamber deep and clear, no cell deep and clear   Iris Round and dilated, No NVI Round and dilated, No NVI   Lens PC IOL in good position, trace Posterior capsular opacification 3+ Nuclear sclerosis, 3+ Cortical cataract   Anterior Vitreous Vitreous syneresis, old white blood clots settled inferiorly, scattered vitreous condensations, no heme centrally Vitreous syneresis, old white blood clots settled inferiorly -- minimal, no heme centrally, Posterior vitreous detachment         Fundus Exam       Right Left   Disc Mild Pallor, Sharp rim, focal PPP/PPA Mild Pallor, Sharp rim   C/D Ratio 0.5 0.5   Macula Flat, Good foveal reflex, RPE mottling and clumping, trace cystic changes temporal macula -- stably improved, rare MA, +ERM Flat, good foveal reflex, ERM, RPE mottling and clumping, rare MA, no edema   Vessels attenuated, mild tortuosity attenuated, Tortuous   Periphery Attached, 360 IRH/DBH - improved, pre-retinal heme settled inferiorly, 360 PRP with room for fill in Attached, scattered IRH/DBH greatest nasally, good 360 PRP changes with room for fill in, No RT/RD           IMAGING AND PROCEDURES   Imaging and Procedures for 06/25/2023  OCT, Retina - OU - Both Eyes       Right Eye Quality was good. Central Foveal Thickness: 290. Progression has been stable. Findings include normal foveal contour, no IRF, no SRF, intraretinal hyper-reflective material, epiretinal membrane (Trace persistent vitreous opacities, stable improvement in cystic changes temporal macula, mild ERM).   Left Eye Quality was good. Central Foveal Thickness: 329. Progression has been stable. Findings include no SRF, abnormal foveal contour, intraretinal hyper-reflective material, epiretinal membrane, intraretinal fluid (ERM with blunted foveal contour, trace cystic changes temporal macula ).   Notes *Images captured and stored on drive  Diagnosis / Impression:  OD: Trace persistent vitreous opacities, stable improvement in cystic changes temporal macula, mild ERM OS: ERM with blunted foveal contour, trace cystic changes temporal macula   Clinical management:  See below  Abbreviations: NFP - Normal foveal profile. CME - cystoid macular edema. PED - pigment epithelial detachment. IRF - intraretinal fluid. SRF - subretinal fluid. EZ - ellipsoid zone. ERM - epiretinal membrane. ORA - outer retinal atrophy. ORT - outer retinal tubulation. SRHM - subretinal hyper-reflective material. IRHM - intraretinal hyper-reflective material      Intravitreal Injection, Pharmacologic Agent - OD - Right Eye       Time Out 06/25/2023. 9:39 AM. Confirmed correct patient, procedure, site, and patient consented.   Anesthesia Topical anesthesia was used. Anesthetic medications included Lidocaine 2%, Proparacaine 0.5%.   Procedure Preparation included 5% betadine to ocular surface, eyelid speculum. A (32g) needle was used.   Injection: 1.25 mg Bevacizumab 1.25mg /0.52ml   Route: Intravitreal, Site: Right Eye   NDC: [id number], Lot: [id number], Expiration date: 04/10/2024   Post-op Post injection exam found visual acuity of at  least counting fingers. The patient tolerated the procedure well.  There were no complications. The patient received written and verbal post procedure care education. Post injection medications were not given.            ASSESSMENT/PLAN:   ICD-10-CM   1. 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 Vitreous hemorrhage of left eye (HCC)  H43.12     3. Vitreous hemorrhage, right eye (HCC)  H43.11 Intravitreal Injection, Pharmacologic Agent - OD - Right Eye    Bevacizumab (AVASTIN) SOLN 1.25 mg    4. Posterior vitreous detachment of left eye  H43.812     5. Essential hypertension  I10     6. Hypertensive retinopathy of both eyes  H35.033     7. Epiretinal membrane (ERM) of left eye  H35.372     8. Combined forms of age-related cataract of left eye  H25.812     9. Pseudophakia  Z96.1      1-3. Proliferative diabetic retinopathy w/ recurrent VH OU (OD > OS)  - A1c: 5.9 on 03.13.24 - s/p IVA OD #1 (12.08.21), #2 (01.11.22), #3 (02.09.22), #4 (11.04.22), #5 (12.02.22), #6 (12.30.22), #7 (01.27.23), #8 (01.05.24), #9 (02.02.24), #10 (08.28.24), #11 (10.09.24), #12 (11.20.24) - s/p IVA OS #1 (12.10.21), #2 (01.11.22), #3 (02.09.22), #4 (09.09.22), #5 (10.07.22), #6 (11.04.22), #7 (12.02.22), #8 (12.30.22) - FA (12.08.21) shows late-leaking MA, vascular nonperfusion, +NV OU (nasal midzone) - s/p PRP OD (01.26.22) - s/p PRP OS (03.09.22) - repeat FA 6.6.22 shows interval improvement in NVE/leakage OU s/p PRP OU-- just minimal leakage remains - repeat FA 10.09.24 shows OD: NV stably regressed, mild leaking MA; OS: Interval improvement in focal NVE IN periphery -- no leakage; mild perifoveal leakage -- both improved from prior - BCVA OD stable at 20/25, OS stable at 20/20  -  OCT shows OD: trace persistent vitreous opacities, cystic changes temporal macula -- improved, mild ERM; OS: ERM with blunted foveal contour, trace cystic changes temporal macula -- improved at 19 weeks  - recommend IVA OD #13 today, 04.02.25 -- maintenance with follow up back to 16 weeks   - pt wishes to proceed with injection  - RBA of procedure discussed, questions answered - IVA informed consent obtained and signed, 01.05.24 (OD) - see procedure note  - f/u 16 weeks, DFE, OCT, possible injections  4. Hemorrhagic PVD OS  - Onset mid-August 2022 w/ new onset floaters, no photopsias  - s/p IVA OS #4 (09.09.22), #5 (10.07.22), #6 (11.04.22), #7 (12.02.22), #8 (12.30.22) - today, VA stably improved to 20/20 OS from 20/40 and 20/80 prior - pt states they have been holding heparin at dialysis - exam shows interval improvement in vitreous heme -- clearing and settling inferiorly - pt reports previously receiving heparin during dialysis (T, Th, Sat) -- may have contributed to interval worsening   - Discussed findings and prognosis  - No RT or RD on 360 peripheral exam  - Reviewed s/s of RT/RD  - Strict return precautions for any such RT/RD signs/symptoms  - VH precautions reviewed -- minimize activities, keep head elevated, hold heparin if able, avoid ASA/NSAIDS  5,6. Hypertensive retinopathy OU - discussed importance of tight BP control - monitor  7. Epiretinal membrane, left eye  - mild ERM - BCVA 20/20 - asymptomatic, no metamorphopsia -  no indication for surgery at this time - monitor for now  8. Mixed Cataract OS - The symptoms of cataract, surgical options, and treatments and risks were discussed with patient - discussed diagnosis and progression - left eye surgery scheduled for Friday, November 22  9. Pseudophakia OD  - s/p CE/IOL (Dr. Dione Booze)  - IOL in good position, doing well  - monitor  10. Ocular Hypertension OU  - IOP today: 15,14  - cont cosopt bid OU     Ophthalmic Meds Ordered this visit:  Meds ordered this encounter  Medications   Bevacizumab (AVASTIN) SOLN 1.25 mg     Return in about 16 weeks (around 10/15/2023) for f/u PDR OU, DFE, OCT, Possible Injxn.  There are no Patient Instructions on file for this visit.  This document serves as a record of services personally performed by Karie Chimera, MD, PhD. It was created on their behalf by Charlette Caffey, COT an ophthalmic technician. The creation of this record is the provider's dictation and/or activities during the visit.    Electronically signed by:  Charlette Caffey, COT  06/28/23 12:22 AM   Karie Chimera, M.D., Ph.D. Diseases & Surgery of the Retina and Vitreous [facility]  I have reviewed the above documentation for accuracy and completeness, and I agree with the above. Karie Chimera, M.D., Ph.D. 06/28/23 12:24 AM   Abbreviations: M myopia (nearsighted); A astigmatism; H hyperopia (farsighted); P presbyopia; Mrx spectacle prescription;  CTL contact lenses; OD right eye; OS left eye; OU both eyes  XT exotropia; ET esotropia; PEK punctate epithelial keratitis; PEE punctate epithelial erosions; DES dry eye syndrome; MGD meibomian gland dysfunction; ATs artificial tears; PFAT's preservative free artificial tears; NSC nuclear sclerotic cataract; PSC posterior subcapsular cataract; ERM epi-retinal membrane; PVD posterior vitreous detachment; RD retinal detachment; DM diabetes mellitus; DR diabetic retinopathy; NPDR non-proliferative diabetic retinopathy; PDR proliferative diabetic retinopathy; CSME clinically significant macular edema; DME diabetic macular edema; dbh dot blot hemorrhages; CWS cotton wool spot; POAG primary open angle glaucoma; C/D cup-to-disc ratio; HVF humphrey visual field; GVF goldmann visual field; OCT optical coherence tomography; IOP intraocular pressure; BRVO Branch retinal vein occlusion; CRVO central retinal vein occlusion;  CRAO central retinal artery occlusion; BRAO branch retinal artery occlusion; RT retinal tear; SB scleral buckle; PPV pars plana vitrectomy; VH Vitreous hemorrhage; PRP panretinal laser photocoagulation; IVK intravitreal kenalog; VMT vitreomacular traction; MH Macular hole;  NVD neovascularization of the disc; NVE neovascularization elsewhere; AREDS age related eye disease study; ARMD age related macular degeneration; POAG primary open angle glaucoma; EBMD epithelial/anterior basement membrane dystrophy; ACIOL anterior chamber intraocular lens; IOL intraocular lens; PCIOL posterior chamber intraocular lens; Phaco/IOL phacoemulsification with intraocular lens placement; PRK photorefractive keratectomy; LASIK laser assisted in situ keratomileusis; HTN hypertension; DM diabetes mellitus; COPD chronic obstructive pulmonary disease

## 2023-06-25 ENCOUNTER — Encounter (INDEPENDENT_AMBULATORY_CARE_PROVIDER_SITE_OTHER): Payer: Self-pay | Admitting: Ophthalmology

## 2023-06-25 ENCOUNTER — Ambulatory Visit (INDEPENDENT_AMBULATORY_CARE_PROVIDER_SITE_OTHER): Admitting: Ophthalmology

## 2023-06-25 DIAGNOSIS — H35372 Puckering of macula, left eye: Secondary | ICD-10-CM | POA: Diagnosis not present

## 2023-06-25 DIAGNOSIS — H35033 Hypertensive retinopathy, bilateral: Secondary | ICD-10-CM | POA: Diagnosis not present

## 2023-06-25 DIAGNOSIS — I10 Essential (primary) hypertension: Secondary | ICD-10-CM | POA: Diagnosis not present

## 2023-06-25 DIAGNOSIS — H4311 Vitreous hemorrhage, right eye: Secondary | ICD-10-CM

## 2023-06-25 DIAGNOSIS — Z961 Presence of intraocular lens: Secondary | ICD-10-CM

## 2023-06-25 DIAGNOSIS — E113513 Type 2 diabetes mellitus with proliferative diabetic retinopathy with macular edema, bilateral: Secondary | ICD-10-CM | POA: Diagnosis not present

## 2023-06-25 DIAGNOSIS — H25812 Combined forms of age-related cataract, left eye: Secondary | ICD-10-CM | POA: Diagnosis not present

## 2023-06-25 DIAGNOSIS — H43812 Vitreous degeneration, left eye: Secondary | ICD-10-CM

## 2023-06-25 DIAGNOSIS — H4313 Vitreous hemorrhage, bilateral: Secondary | ICD-10-CM | POA: Diagnosis not present

## 2023-06-25 DIAGNOSIS — H4312 Vitreous hemorrhage, left eye: Secondary | ICD-10-CM

## 2023-06-25 MED ORDER — BEVACIZUMAB CHEMO INJECTION 1.25MG/0.05ML SYRINGE FOR KALEIDOSCOPE
1.2500 mg | INTRAVITREAL | Status: AC | PRN
  Administered 2023-06-25: 1.25 mg via INTRAVITREAL

## 2023-06-26 DIAGNOSIS — Z992 Dependence on renal dialysis: Secondary | ICD-10-CM | POA: Diagnosis not present

## 2023-06-26 DIAGNOSIS — N2581 Secondary hyperparathyroidism of renal origin: Secondary | ICD-10-CM | POA: Diagnosis not present

## 2023-06-26 DIAGNOSIS — N186 End stage renal disease: Secondary | ICD-10-CM | POA: Diagnosis not present

## 2023-06-26 DIAGNOSIS — R52 Pain, unspecified: Secondary | ICD-10-CM | POA: Diagnosis not present

## 2023-06-26 DIAGNOSIS — E1129 Type 2 diabetes mellitus with other diabetic kidney complication: Secondary | ICD-10-CM | POA: Diagnosis not present

## 2023-06-26 DIAGNOSIS — D631 Anemia in chronic kidney disease: Secondary | ICD-10-CM | POA: Diagnosis not present

## 2023-06-26 DIAGNOSIS — R197 Diarrhea, unspecified: Secondary | ICD-10-CM | POA: Diagnosis not present

## 2023-06-28 DIAGNOSIS — R52 Pain, unspecified: Secondary | ICD-10-CM | POA: Diagnosis not present

## 2023-06-28 DIAGNOSIS — E1129 Type 2 diabetes mellitus with other diabetic kidney complication: Secondary | ICD-10-CM | POA: Diagnosis not present

## 2023-06-28 DIAGNOSIS — R197 Diarrhea, unspecified: Secondary | ICD-10-CM | POA: Diagnosis not present

## 2023-06-28 DIAGNOSIS — N2581 Secondary hyperparathyroidism of renal origin: Secondary | ICD-10-CM | POA: Diagnosis not present

## 2023-06-28 DIAGNOSIS — D631 Anemia in chronic kidney disease: Secondary | ICD-10-CM | POA: Diagnosis not present

## 2023-06-28 DIAGNOSIS — Z992 Dependence on renal dialysis: Secondary | ICD-10-CM | POA: Diagnosis not present

## 2023-06-28 DIAGNOSIS — N186 End stage renal disease: Secondary | ICD-10-CM | POA: Diagnosis not present

## 2023-07-01 DIAGNOSIS — R52 Pain, unspecified: Secondary | ICD-10-CM | POA: Diagnosis not present

## 2023-07-01 DIAGNOSIS — E1129 Type 2 diabetes mellitus with other diabetic kidney complication: Secondary | ICD-10-CM | POA: Diagnosis not present

## 2023-07-01 DIAGNOSIS — Z992 Dependence on renal dialysis: Secondary | ICD-10-CM | POA: Diagnosis not present

## 2023-07-01 DIAGNOSIS — R197 Diarrhea, unspecified: Secondary | ICD-10-CM | POA: Diagnosis not present

## 2023-07-01 DIAGNOSIS — D631 Anemia in chronic kidney disease: Secondary | ICD-10-CM | POA: Diagnosis not present

## 2023-07-01 DIAGNOSIS — N186 End stage renal disease: Secondary | ICD-10-CM | POA: Diagnosis not present

## 2023-07-01 DIAGNOSIS — N2581 Secondary hyperparathyroidism of renal origin: Secondary | ICD-10-CM | POA: Diagnosis not present

## 2023-07-03 DIAGNOSIS — E1129 Type 2 diabetes mellitus with other diabetic kidney complication: Secondary | ICD-10-CM | POA: Diagnosis not present

## 2023-07-03 DIAGNOSIS — Z992 Dependence on renal dialysis: Secondary | ICD-10-CM | POA: Diagnosis not present

## 2023-07-03 DIAGNOSIS — N186 End stage renal disease: Secondary | ICD-10-CM | POA: Diagnosis not present

## 2023-07-03 DIAGNOSIS — R52 Pain, unspecified: Secondary | ICD-10-CM | POA: Diagnosis not present

## 2023-07-03 DIAGNOSIS — R197 Diarrhea, unspecified: Secondary | ICD-10-CM | POA: Diagnosis not present

## 2023-07-03 DIAGNOSIS — D631 Anemia in chronic kidney disease: Secondary | ICD-10-CM | POA: Diagnosis not present

## 2023-07-03 DIAGNOSIS — N2581 Secondary hyperparathyroidism of renal origin: Secondary | ICD-10-CM | POA: Diagnosis not present

## 2023-07-05 DIAGNOSIS — R52 Pain, unspecified: Secondary | ICD-10-CM | POA: Diagnosis not present

## 2023-07-05 DIAGNOSIS — D631 Anemia in chronic kidney disease: Secondary | ICD-10-CM | POA: Diagnosis not present

## 2023-07-05 DIAGNOSIS — N186 End stage renal disease: Secondary | ICD-10-CM | POA: Diagnosis not present

## 2023-07-05 DIAGNOSIS — N2581 Secondary hyperparathyroidism of renal origin: Secondary | ICD-10-CM | POA: Diagnosis not present

## 2023-07-05 DIAGNOSIS — E1129 Type 2 diabetes mellitus with other diabetic kidney complication: Secondary | ICD-10-CM | POA: Diagnosis not present

## 2023-07-05 DIAGNOSIS — Z992 Dependence on renal dialysis: Secondary | ICD-10-CM | POA: Diagnosis not present

## 2023-07-05 DIAGNOSIS — R197 Diarrhea, unspecified: Secondary | ICD-10-CM | POA: Diagnosis not present

## 2023-07-08 DIAGNOSIS — N2581 Secondary hyperparathyroidism of renal origin: Secondary | ICD-10-CM | POA: Diagnosis not present

## 2023-07-08 DIAGNOSIS — E1129 Type 2 diabetes mellitus with other diabetic kidney complication: Secondary | ICD-10-CM | POA: Diagnosis not present

## 2023-07-08 DIAGNOSIS — D631 Anemia in chronic kidney disease: Secondary | ICD-10-CM | POA: Diagnosis not present

## 2023-07-08 DIAGNOSIS — N186 End stage renal disease: Secondary | ICD-10-CM | POA: Diagnosis not present

## 2023-07-08 DIAGNOSIS — R52 Pain, unspecified: Secondary | ICD-10-CM | POA: Diagnosis not present

## 2023-07-08 DIAGNOSIS — R197 Diarrhea, unspecified: Secondary | ICD-10-CM | POA: Diagnosis not present

## 2023-07-08 DIAGNOSIS — Z992 Dependence on renal dialysis: Secondary | ICD-10-CM | POA: Diagnosis not present

## 2023-07-10 DIAGNOSIS — E1129 Type 2 diabetes mellitus with other diabetic kidney complication: Secondary | ICD-10-CM | POA: Diagnosis not present

## 2023-07-10 DIAGNOSIS — R197 Diarrhea, unspecified: Secondary | ICD-10-CM | POA: Diagnosis not present

## 2023-07-10 DIAGNOSIS — N2581 Secondary hyperparathyroidism of renal origin: Secondary | ICD-10-CM | POA: Diagnosis not present

## 2023-07-10 DIAGNOSIS — Z992 Dependence on renal dialysis: Secondary | ICD-10-CM | POA: Diagnosis not present

## 2023-07-10 DIAGNOSIS — N186 End stage renal disease: Secondary | ICD-10-CM | POA: Diagnosis not present

## 2023-07-10 DIAGNOSIS — D631 Anemia in chronic kidney disease: Secondary | ICD-10-CM | POA: Diagnosis not present

## 2023-07-10 DIAGNOSIS — R52 Pain, unspecified: Secondary | ICD-10-CM | POA: Diagnosis not present

## 2023-07-12 DIAGNOSIS — N186 End stage renal disease: Secondary | ICD-10-CM | POA: Diagnosis not present

## 2023-07-12 DIAGNOSIS — D631 Anemia in chronic kidney disease: Secondary | ICD-10-CM | POA: Diagnosis not present

## 2023-07-12 DIAGNOSIS — N2581 Secondary hyperparathyroidism of renal origin: Secondary | ICD-10-CM | POA: Diagnosis not present

## 2023-07-12 DIAGNOSIS — R52 Pain, unspecified: Secondary | ICD-10-CM | POA: Diagnosis not present

## 2023-07-12 DIAGNOSIS — R197 Diarrhea, unspecified: Secondary | ICD-10-CM | POA: Diagnosis not present

## 2023-07-12 DIAGNOSIS — Z992 Dependence on renal dialysis: Secondary | ICD-10-CM | POA: Diagnosis not present

## 2023-07-12 DIAGNOSIS — E1129 Type 2 diabetes mellitus with other diabetic kidney complication: Secondary | ICD-10-CM | POA: Diagnosis not present

## 2023-07-15 DIAGNOSIS — R197 Diarrhea, unspecified: Secondary | ICD-10-CM | POA: Diagnosis not present

## 2023-07-15 DIAGNOSIS — N186 End stage renal disease: Secondary | ICD-10-CM | POA: Diagnosis not present

## 2023-07-15 DIAGNOSIS — Z992 Dependence on renal dialysis: Secondary | ICD-10-CM | POA: Diagnosis not present

## 2023-07-15 DIAGNOSIS — D631 Anemia in chronic kidney disease: Secondary | ICD-10-CM | POA: Diagnosis not present

## 2023-07-15 DIAGNOSIS — N2581 Secondary hyperparathyroidism of renal origin: Secondary | ICD-10-CM | POA: Diagnosis not present

## 2023-07-15 DIAGNOSIS — E1129 Type 2 diabetes mellitus with other diabetic kidney complication: Secondary | ICD-10-CM | POA: Diagnosis not present

## 2023-07-15 DIAGNOSIS — R52 Pain, unspecified: Secondary | ICD-10-CM | POA: Diagnosis not present

## 2023-07-17 DIAGNOSIS — N186 End stage renal disease: Secondary | ICD-10-CM | POA: Diagnosis not present

## 2023-07-17 DIAGNOSIS — E1129 Type 2 diabetes mellitus with other diabetic kidney complication: Secondary | ICD-10-CM | POA: Diagnosis not present

## 2023-07-17 DIAGNOSIS — R197 Diarrhea, unspecified: Secondary | ICD-10-CM | POA: Diagnosis not present

## 2023-07-17 DIAGNOSIS — Z992 Dependence on renal dialysis: Secondary | ICD-10-CM | POA: Diagnosis not present

## 2023-07-17 DIAGNOSIS — R52 Pain, unspecified: Secondary | ICD-10-CM | POA: Diagnosis not present

## 2023-07-17 DIAGNOSIS — N2581 Secondary hyperparathyroidism of renal origin: Secondary | ICD-10-CM | POA: Diagnosis not present

## 2023-07-17 DIAGNOSIS — D631 Anemia in chronic kidney disease: Secondary | ICD-10-CM | POA: Diagnosis not present

## 2023-07-19 DIAGNOSIS — N2581 Secondary hyperparathyroidism of renal origin: Secondary | ICD-10-CM | POA: Diagnosis not present

## 2023-07-19 DIAGNOSIS — D631 Anemia in chronic kidney disease: Secondary | ICD-10-CM | POA: Diagnosis not present

## 2023-07-19 DIAGNOSIS — N186 End stage renal disease: Secondary | ICD-10-CM | POA: Diagnosis not present

## 2023-07-19 DIAGNOSIS — Z992 Dependence on renal dialysis: Secondary | ICD-10-CM | POA: Diagnosis not present

## 2023-07-19 DIAGNOSIS — E1129 Type 2 diabetes mellitus with other diabetic kidney complication: Secondary | ICD-10-CM | POA: Diagnosis not present

## 2023-07-19 DIAGNOSIS — R52 Pain, unspecified: Secondary | ICD-10-CM | POA: Diagnosis not present

## 2023-07-19 DIAGNOSIS — R197 Diarrhea, unspecified: Secondary | ICD-10-CM | POA: Diagnosis not present

## 2023-07-22 DIAGNOSIS — E1129 Type 2 diabetes mellitus with other diabetic kidney complication: Secondary | ICD-10-CM | POA: Diagnosis not present

## 2023-07-22 DIAGNOSIS — Z992 Dependence on renal dialysis: Secondary | ICD-10-CM | POA: Diagnosis not present

## 2023-07-22 DIAGNOSIS — R52 Pain, unspecified: Secondary | ICD-10-CM | POA: Diagnosis not present

## 2023-07-22 DIAGNOSIS — D631 Anemia in chronic kidney disease: Secondary | ICD-10-CM | POA: Diagnosis not present

## 2023-07-22 DIAGNOSIS — N186 End stage renal disease: Secondary | ICD-10-CM | POA: Diagnosis not present

## 2023-07-22 DIAGNOSIS — R197 Diarrhea, unspecified: Secondary | ICD-10-CM | POA: Diagnosis not present

## 2023-07-22 DIAGNOSIS — N2581 Secondary hyperparathyroidism of renal origin: Secondary | ICD-10-CM | POA: Diagnosis not present

## 2023-07-23 DIAGNOSIS — E1122 Type 2 diabetes mellitus with diabetic chronic kidney disease: Secondary | ICD-10-CM | POA: Diagnosis not present

## 2023-07-23 DIAGNOSIS — Z992 Dependence on renal dialysis: Secondary | ICD-10-CM | POA: Diagnosis not present

## 2023-07-23 DIAGNOSIS — N186 End stage renal disease: Secondary | ICD-10-CM | POA: Diagnosis not present

## 2023-07-24 DIAGNOSIS — D631 Anemia in chronic kidney disease: Secondary | ICD-10-CM | POA: Diagnosis not present

## 2023-07-24 DIAGNOSIS — R52 Pain, unspecified: Secondary | ICD-10-CM | POA: Diagnosis not present

## 2023-07-24 DIAGNOSIS — Z992 Dependence on renal dialysis: Secondary | ICD-10-CM | POA: Diagnosis not present

## 2023-07-24 DIAGNOSIS — E1129 Type 2 diabetes mellitus with other diabetic kidney complication: Secondary | ICD-10-CM | POA: Diagnosis not present

## 2023-07-24 DIAGNOSIS — D509 Iron deficiency anemia, unspecified: Secondary | ICD-10-CM | POA: Diagnosis not present

## 2023-07-24 DIAGNOSIS — N2581 Secondary hyperparathyroidism of renal origin: Secondary | ICD-10-CM | POA: Diagnosis not present

## 2023-07-24 DIAGNOSIS — N186 End stage renal disease: Secondary | ICD-10-CM | POA: Diagnosis not present

## 2023-07-24 DIAGNOSIS — R197 Diarrhea, unspecified: Secondary | ICD-10-CM | POA: Diagnosis not present

## 2023-07-26 DIAGNOSIS — R197 Diarrhea, unspecified: Secondary | ICD-10-CM | POA: Diagnosis not present

## 2023-07-26 DIAGNOSIS — R52 Pain, unspecified: Secondary | ICD-10-CM | POA: Diagnosis not present

## 2023-07-26 DIAGNOSIS — Z992 Dependence on renal dialysis: Secondary | ICD-10-CM | POA: Diagnosis not present

## 2023-07-26 DIAGNOSIS — N2581 Secondary hyperparathyroidism of renal origin: Secondary | ICD-10-CM | POA: Diagnosis not present

## 2023-07-26 DIAGNOSIS — D631 Anemia in chronic kidney disease: Secondary | ICD-10-CM | POA: Diagnosis not present

## 2023-07-26 DIAGNOSIS — D509 Iron deficiency anemia, unspecified: Secondary | ICD-10-CM | POA: Diagnosis not present

## 2023-07-26 DIAGNOSIS — E1129 Type 2 diabetes mellitus with other diabetic kidney complication: Secondary | ICD-10-CM | POA: Diagnosis not present

## 2023-07-26 DIAGNOSIS — N186 End stage renal disease: Secondary | ICD-10-CM | POA: Diagnosis not present

## 2023-07-28 ENCOUNTER — Encounter: Payer: Self-pay | Admitting: Podiatry

## 2023-07-28 ENCOUNTER — Ambulatory Visit (INDEPENDENT_AMBULATORY_CARE_PROVIDER_SITE_OTHER): Payer: Medicare Other | Admitting: Podiatry

## 2023-07-28 DIAGNOSIS — E1149 Type 2 diabetes mellitus with other diabetic neurological complication: Secondary | ICD-10-CM

## 2023-07-28 DIAGNOSIS — B351 Tinea unguium: Secondary | ICD-10-CM

## 2023-07-28 DIAGNOSIS — M79674 Pain in right toe(s): Secondary | ICD-10-CM

## 2023-07-28 DIAGNOSIS — M79675 Pain in left toe(s): Secondary | ICD-10-CM | POA: Diagnosis not present

## 2023-07-28 NOTE — Progress Notes (Signed)
 Subjective: Chief Complaint  Patient presents with   [facility]    RM[facility]14 [facility]     68 y.o. returns the office today for painful, elongated, thickened toenails which he cannot trim himself.  States his diabetic shoes have been doing very well he is very happy with them.  No open lesions.  Gabpentin helping- taking once a day- Still complaining of numbness but overall doing better.  PCP: Virgle Grime, MD   Objective: AAO 3, NAD DP/PT pulses palpable, CRT less than 3 seconds Sensation decreased with Charolett Copes. Nails hypertrophic, dystrophic, elongated, brittle, discolored 10. There is tenderness overlying the nails 1-5 bilaterally. There is no surrounding erythema or drainage along the nail sites. No open lesions or pre-ulcerative lesions are identified. No pain with calf compression, swelling, warmth, erythema.  Assessment: Patient presents with symptomatic onychomycosis; type 2 diabetes with neuropathy  Plan:  Symptomatic onychomycosis -Nails sharply debrided 10 without complication/bleeding. -Daily foot inspection  Return in about 3 months (around 10/28/2023).  Charity Conch DPM

## 2023-07-29 DIAGNOSIS — D509 Iron deficiency anemia, unspecified: Secondary | ICD-10-CM | POA: Diagnosis not present

## 2023-07-29 DIAGNOSIS — N2581 Secondary hyperparathyroidism of renal origin: Secondary | ICD-10-CM | POA: Diagnosis not present

## 2023-07-29 DIAGNOSIS — Z992 Dependence on renal dialysis: Secondary | ICD-10-CM | POA: Diagnosis not present

## 2023-07-29 DIAGNOSIS — E1129 Type 2 diabetes mellitus with other diabetic kidney complication: Secondary | ICD-10-CM | POA: Diagnosis not present

## 2023-07-29 DIAGNOSIS — R52 Pain, unspecified: Secondary | ICD-10-CM | POA: Diagnosis not present

## 2023-07-29 DIAGNOSIS — D631 Anemia in chronic kidney disease: Secondary | ICD-10-CM | POA: Diagnosis not present

## 2023-07-29 DIAGNOSIS — N186 End stage renal disease: Secondary | ICD-10-CM | POA: Diagnosis not present

## 2023-07-29 DIAGNOSIS — R197 Diarrhea, unspecified: Secondary | ICD-10-CM | POA: Diagnosis not present

## 2023-07-31 DIAGNOSIS — R197 Diarrhea, unspecified: Secondary | ICD-10-CM | POA: Diagnosis not present

## 2023-07-31 DIAGNOSIS — D631 Anemia in chronic kidney disease: Secondary | ICD-10-CM | POA: Diagnosis not present

## 2023-07-31 DIAGNOSIS — D509 Iron deficiency anemia, unspecified: Secondary | ICD-10-CM | POA: Diagnosis not present

## 2023-07-31 DIAGNOSIS — Z992 Dependence on renal dialysis: Secondary | ICD-10-CM | POA: Diagnosis not present

## 2023-07-31 DIAGNOSIS — E1129 Type 2 diabetes mellitus with other diabetic kidney complication: Secondary | ICD-10-CM | POA: Diagnosis not present

## 2023-07-31 DIAGNOSIS — R52 Pain, unspecified: Secondary | ICD-10-CM | POA: Diagnosis not present

## 2023-07-31 DIAGNOSIS — N186 End stage renal disease: Secondary | ICD-10-CM | POA: Diagnosis not present

## 2023-07-31 DIAGNOSIS — N2581 Secondary hyperparathyroidism of renal origin: Secondary | ICD-10-CM | POA: Diagnosis not present

## 2023-08-02 DIAGNOSIS — N186 End stage renal disease: Secondary | ICD-10-CM | POA: Diagnosis not present

## 2023-08-02 DIAGNOSIS — D509 Iron deficiency anemia, unspecified: Secondary | ICD-10-CM | POA: Diagnosis not present

## 2023-08-02 DIAGNOSIS — R197 Diarrhea, unspecified: Secondary | ICD-10-CM | POA: Diagnosis not present

## 2023-08-02 DIAGNOSIS — D631 Anemia in chronic kidney disease: Secondary | ICD-10-CM | POA: Diagnosis not present

## 2023-08-02 DIAGNOSIS — Z992 Dependence on renal dialysis: Secondary | ICD-10-CM | POA: Diagnosis not present

## 2023-08-02 DIAGNOSIS — N2581 Secondary hyperparathyroidism of renal origin: Secondary | ICD-10-CM | POA: Diagnosis not present

## 2023-08-02 DIAGNOSIS — R52 Pain, unspecified: Secondary | ICD-10-CM | POA: Diagnosis not present

## 2023-08-02 DIAGNOSIS — E1129 Type 2 diabetes mellitus with other diabetic kidney complication: Secondary | ICD-10-CM | POA: Diagnosis not present

## 2023-08-05 DIAGNOSIS — N2581 Secondary hyperparathyroidism of renal origin: Secondary | ICD-10-CM | POA: Diagnosis not present

## 2023-08-05 DIAGNOSIS — R52 Pain, unspecified: Secondary | ICD-10-CM | POA: Diagnosis not present

## 2023-08-05 DIAGNOSIS — R197 Diarrhea, unspecified: Secondary | ICD-10-CM | POA: Diagnosis not present

## 2023-08-05 DIAGNOSIS — N186 End stage renal disease: Secondary | ICD-10-CM | POA: Diagnosis not present

## 2023-08-05 DIAGNOSIS — Z992 Dependence on renal dialysis: Secondary | ICD-10-CM | POA: Diagnosis not present

## 2023-08-05 DIAGNOSIS — D631 Anemia in chronic kidney disease: Secondary | ICD-10-CM | POA: Diagnosis not present

## 2023-08-05 DIAGNOSIS — E1129 Type 2 diabetes mellitus with other diabetic kidney complication: Secondary | ICD-10-CM | POA: Diagnosis not present

## 2023-08-05 DIAGNOSIS — D509 Iron deficiency anemia, unspecified: Secondary | ICD-10-CM | POA: Diagnosis not present

## 2023-08-07 DIAGNOSIS — Z992 Dependence on renal dialysis: Secondary | ICD-10-CM | POA: Diagnosis not present

## 2023-08-07 DIAGNOSIS — E1129 Type 2 diabetes mellitus with other diabetic kidney complication: Secondary | ICD-10-CM | POA: Diagnosis not present

## 2023-08-07 DIAGNOSIS — D509 Iron deficiency anemia, unspecified: Secondary | ICD-10-CM | POA: Diagnosis not present

## 2023-08-07 DIAGNOSIS — D631 Anemia in chronic kidney disease: Secondary | ICD-10-CM | POA: Diagnosis not present

## 2023-08-07 DIAGNOSIS — R197 Diarrhea, unspecified: Secondary | ICD-10-CM | POA: Diagnosis not present

## 2023-08-07 DIAGNOSIS — N186 End stage renal disease: Secondary | ICD-10-CM | POA: Diagnosis not present

## 2023-08-07 DIAGNOSIS — R52 Pain, unspecified: Secondary | ICD-10-CM | POA: Diagnosis not present

## 2023-08-07 DIAGNOSIS — N2581 Secondary hyperparathyroidism of renal origin: Secondary | ICD-10-CM | POA: Diagnosis not present

## 2023-08-09 DIAGNOSIS — R52 Pain, unspecified: Secondary | ICD-10-CM | POA: Diagnosis not present

## 2023-08-09 DIAGNOSIS — D509 Iron deficiency anemia, unspecified: Secondary | ICD-10-CM | POA: Diagnosis not present

## 2023-08-09 DIAGNOSIS — E1129 Type 2 diabetes mellitus with other diabetic kidney complication: Secondary | ICD-10-CM | POA: Diagnosis not present

## 2023-08-09 DIAGNOSIS — D631 Anemia in chronic kidney disease: Secondary | ICD-10-CM | POA: Diagnosis not present

## 2023-08-09 DIAGNOSIS — R197 Diarrhea, unspecified: Secondary | ICD-10-CM | POA: Diagnosis not present

## 2023-08-09 DIAGNOSIS — Z992 Dependence on renal dialysis: Secondary | ICD-10-CM | POA: Diagnosis not present

## 2023-08-09 DIAGNOSIS — N2581 Secondary hyperparathyroidism of renal origin: Secondary | ICD-10-CM | POA: Diagnosis not present

## 2023-08-09 DIAGNOSIS — N186 End stage renal disease: Secondary | ICD-10-CM | POA: Diagnosis not present

## 2023-08-11 ENCOUNTER — Other Ambulatory Visit (INDEPENDENT_AMBULATORY_CARE_PROVIDER_SITE_OTHER): Payer: Self-pay | Admitting: Ophthalmology

## 2023-08-13 ENCOUNTER — Other Ambulatory Visit: Payer: Self-pay

## 2023-08-13 ENCOUNTER — Encounter (HOSPITAL_COMMUNITY): Payer: Self-pay | Admitting: Emergency Medicine

## 2023-08-13 ENCOUNTER — Emergency Department (HOSPITAL_COMMUNITY)

## 2023-08-13 ENCOUNTER — Emergency Department (HOSPITAL_COMMUNITY)
Admission: EM | Admit: 2023-08-13 | Discharge: 2023-08-13 | Disposition: A | Discharge status: Home / Self Care | Attending: Emergency Medicine | Admitting: Emergency Medicine

## 2023-08-13 DIAGNOSIS — I10 Essential (primary) hypertension: Secondary | ICD-10-CM | POA: Diagnosis not present

## 2023-08-13 DIAGNOSIS — R5383 Other fatigue: Secondary | ICD-10-CM | POA: Diagnosis not present

## 2023-08-13 DIAGNOSIS — R531 Weakness: Secondary | ICD-10-CM | POA: Diagnosis not present

## 2023-08-13 DIAGNOSIS — R059 Cough, unspecified: Secondary | ICD-10-CM | POA: Diagnosis not present

## 2023-08-13 DIAGNOSIS — E871 Hypo-osmolality and hyponatremia: Secondary | ICD-10-CM | POA: Insufficient documentation

## 2023-08-13 DIAGNOSIS — R109 Unspecified abdominal pain: Secondary | ICD-10-CM | POA: Diagnosis not present

## 2023-08-13 DIAGNOSIS — I517 Cardiomegaly: Secondary | ICD-10-CM | POA: Diagnosis not present

## 2023-08-13 DIAGNOSIS — R404 Transient alteration of awareness: Secondary | ICD-10-CM | POA: Diagnosis not present

## 2023-08-13 DIAGNOSIS — R0989 Other specified symptoms and signs involving the circulatory and respiratory systems: Secondary | ICD-10-CM | POA: Diagnosis not present

## 2023-08-13 LAB — COMPREHENSIVE METABOLIC PANEL WITH GFR
ALT: 11 U/L (ref 0–44)
AST: 12 U/L — ABNORMAL LOW (ref 15–41)
Albumin: 3.7 g/dL (ref 3.5–5.0)
Alkaline Phosphatase: 92 U/L (ref 38–126)
Anion gap: 18 — ABNORMAL HIGH (ref 5–15)
BUN: 58 mg/dL — ABNORMAL HIGH (ref 8–23)
CO2: 22 mmol/L (ref 22–32)
Calcium: 9 mg/dL (ref 8.9–10.3)
Chloride: 93 mmol/L — ABNORMAL LOW (ref 98–111)
Creatinine, Ser: 15.81 mg/dL — ABNORMAL HIGH (ref 0.61–1.24)
GFR, Estimated: 3 mL/min — ABNORMAL LOW (ref >60)
Glucose, Bld: 141 mg/dL — ABNORMAL HIGH (ref 70–99)
Potassium: 4.4 mmol/L (ref 3.5–5.1)
Sodium: 133 mmol/L — ABNORMAL LOW (ref 135–145)
Total Bilirubin: 1.1 mg/dL (ref 0.0–1.2)
Total Protein: 7.4 g/dL (ref 6.5–8.1)

## 2023-08-13 LAB — CBC
HCT: 30.5 % — ABNORMAL LOW (ref 39.0–52.0)
Hemoglobin: 10.1 g/dL — ABNORMAL LOW (ref 13.0–17.0)
MCH: 33.9 pg (ref 26.0–34.0)
MCHC: 33.1 g/dL (ref 30.0–36.0)
MCV: 102.3 fL — ABNORMAL HIGH (ref 80.0–100.0)
Platelets: 129 10*3/uL — ABNORMAL LOW (ref 150–400)
RBC: 2.98 MIL/uL — ABNORMAL LOW (ref 4.22–5.81)
RDW: 13.5 % (ref 11.5–15.5)
WBC: 4.7 10*3/uL (ref 4.0–10.5)
nRBC: 0 % (ref 0.0–0.2)

## 2023-08-13 LAB — CBG MONITORING, ED: Glucose-Capillary: 148 mg/dL — ABNORMAL HIGH (ref 70–99)

## 2023-08-13 MED ORDER — ONDANSETRON 4 MG PO TBDP
ORAL_TABLET | ORAL | 0 refills | Status: DC
Start: 2023-08-13 — End: 2023-09-02

## 2023-08-13 MED ORDER — ONDANSETRON 4 MG PO TBDP
4.0000 mg | ORAL_TABLET | Freq: Once | ORAL | Status: AC
  Administered 2023-08-13: 4 mg via ORAL
  Filled 2023-08-13: qty 1

## 2023-08-13 MED ORDER — OXYCODONE HCL 5 MG PO TABS
5.0000 mg | ORAL_TABLET | Freq: Once | ORAL | Status: AC
  Administered 2023-08-13: 5 mg via ORAL
  Filled 2023-08-13: qty 1

## 2023-08-13 NOTE — ED Notes (Signed)
 Pt doesn't make urine unable to obtain UA

## 2023-08-13 NOTE — ED Notes (Signed)
 X-ray at bedside

## 2023-08-13 NOTE — ED Notes (Signed)
 Patients wife called and notified that patient will be in the lobby waiting for her to pick him up.

## 2023-08-13 NOTE — Discharge Instructions (Addendum)
 Your lab work looked okay.  Please follow-up with your family doctor in the office.  Typically I would have you try a medication called MiraLAX  if you feel you are constipated.  I would have you follow the instructions on the bottle.  I prescribed you some nausea medicine to take at home.  Please return for worsening symptoms chest pain difficulty breathing fever cough up blood or if you pass out.

## 2023-08-13 NOTE — ED Provider Notes (Signed)
 [place] EMERGENCY DEPARTMENT AT [facility] Provider Note   CSN: [id number] Arrival date & time: 08/13/23  1257     History  Chief Complaint  Patient presents with   Weakness    Jonathan Mcneil is a 68 y.o. male.  68 yo M with a chief complaints of not feeling well.  Congestion cough nausea vomiting.  Going on for a couple days.  He tried to go to dialysis this morning but it sounds like there was some delay to get him hooked up to the machine and so he left.  Since then has not felt well.  He feels like he has been moving his bowels as well as he normally does and also feels like his abdomen is a bit distended.  No known sick contacts.  No recent travel.  No suspicious food intake.   Weakness      Home Medications Prior to Admission medications   Medication Sig Start Date End Date Taking? Authorizing Provider  ondansetron  (ZOFRAN -ODT) 4 MG disintegrating tablet 4mg  ODT q4 hours prn nausea/vomit 08/13/23  Yes Gillermo Poch, DO  atorvastatin (LIPITOR) 20 MG tablet Take 20 mg by mouth daily.    [provider]  ENTRESTO 24-26 MG Take 1 tablet by mouth 2 (two) times daily. 10/11/21   [provider]  gabapentin  (NEURONTIN ) 300 MG capsule Take 300 mg by mouth at bedtime. 09/20/20   [provider]  loperamide  (IMODIUM ) 2 MG capsule Take 1 capsule (2 mg total) by mouth 4 (four) times daily as needed for diarrhea or loose stools. 10/23/21   Long, Shereen Dike, MD  methocarbamol  (ROBAXIN ) 500 MG tablet Take 1 tablet (500 mg total) by mouth every 8 (eight) hours as needed for muscle spasms. 08/12/22   Iva Mariner, MD  metoprolol  succinate (TOPROL -XL) 50 MG 24 hr tablet Take 1 tablet (50 mg total) by mouth daily. 05/28/23   Lauralee Poll, MD  pantoprazole  (PROTONIX ) 40 MG tablet Take 1 tablet (40 mg total) by mouth 2 (two) times daily before a meal. Twice a day x 4 weeks, then daily 06/17/21   [name], [name], MD  sertraline  (ZOLOFT ) 25 MG tablet Take 25 mg  by mouth 3 (three) times a week. M W F 11/14/20   [provider]  sucroferric oxyhydroxide (VELPHORO ) 500 MG chewable tablet Chew 500 mg by mouth 3 (three) times daily with meals.    [provider]  traMADol  (ULTRAM ) 50 MG tablet Take 25 mg by mouth as needed. 3 x weekly after dialysis as needed for pain    [provider]      Allergies    Gabapentin , Ibuprofen, Penicillins, and Venlafaxine    Review of Systems   Review of Systems  Neurological:  Positive for weakness.    Physical Exam Updated Vital Signs BP 132/79   Pulse 74   Temp 97.8 F (36.6 C) (Oral)   Resp 15   Ht 5\' 9"  (1.753 m)   Wt 100 kg   SpO2 100%   BMI 32.56 kg/m  Physical Exam Vitals and nursing note reviewed.  Constitutional:      Appearance: He is well-developed.  HENT:     Head: Normocephalic and atraumatic.  Eyes:     Pupils: Pupils are equal, round, and reactive to light.  Neck:     Vascular: No JVD.  Cardiovascular:     Rate and Rhythm: Normal rate and regular rhythm.     Heart sounds: No  murmur heard.    No friction rub. No gallop.  Pulmonary:     Effort: No respiratory distress.     Breath sounds: No wheezing.  Abdominal:     General: There is no distension.     Tenderness: There is no abdominal tenderness. There is no guarding or rebound.  Musculoskeletal:        General: Normal range of motion.     Cervical back: Normal range of motion and neck supple.     Comments: Left AV fistula with palpable thrill  Skin:    Coloration: Skin is not pale.     Findings: No rash.  Neurological:     Mental Status: He is alert and oriented to person, place, and time.  Psychiatric:        Behavior: Behavior normal.     ED Results / Procedures / Treatments   Labs (all labs ordered are listed, but only abnormal results are displayed) Labs Reviewed  COMPREHENSIVE METABOLIC PANEL WITH GFR - Abnormal; Notable for the following components:      Result Value   Sodium 133 (*)     Chloride 93 (*)    Glucose, Bld 141 (*)    BUN 58 (*)    Creatinine, Ser 15.81 (*)    AST 12 (*)    GFR, Estimated 3 (*)    Anion gap 18 (*)    All other components within normal limits  CBC - Abnormal; Notable for the following components:   RBC 2.98 (*)    Hemoglobin 10.1 (*)    HCT 30.5 (*)    MCV 102.3 (*)    Platelets 129 (*)    All other components within normal limits  CBG MONITORING, ED - Abnormal; Notable for the following components:   Glucose-Capillary 148 (*)    All other components within normal limits  URINALYSIS, ROUTINE W REFLEX MICROSCOPIC    EKG EKG Interpretation Date/Time:  Wednesday Aug 13 2023 13:08:51 EDT Ventricular Rate:  74 PR Interval:  184 QRS Duration:  96 QT Interval:  444 QTC Calculation: 492 R Axis:   -49  Text Interpretation: Sinus rhythm with occasional Premature ventricular complexes Left axis deviation Prolonged QT Abnormal ECG No significant change since last tracing Confirmed by Albertus Hughs [phone]) on 08/13/2023 4:14:27 PM  Radiology DG Chest Port 1 View Result Date: 08/13/2023 CLINICAL DATA:  Cough and congestion. EXAM: PORTABLE CHEST 1 VIEW COMPARISON:  Chest radiograph dated 10/17/2022. FINDINGS: Mild cardiomegaly and mild vascular congestion. No focal consolidation pleural effusion, or pneumothorax. No acute osseous pathology. IMPRESSION: Mild cardiomegaly and mild vascular congestion. Electronically Signed   By: Angus Bark M.D.   On: 08/13/2023 17:06    Procedures Procedures    Medications Ordered in ED Medications  oxyCODONE  (Oxy IR/ROXICODONE ) immediate release tablet 5 mg (5 mg Oral Given 08/13/23 1555)  ondansetron  (ZOFRAN -ODT) disintegrating tablet 4 mg (4 mg Oral Given 08/13/23 1557)    ED Course/ Medical Decision Making/ A&P                                 Medical Decision Making Amount and/or Complexity of Data Reviewed Labs: ordered. Radiology: ordered.  Risk Prescription drug management.   68 yo M  with a chief complaints of not feeling well.  Cough congestion nausea vomiting going on for couple days.  He missed his dialysis session this morning.  No obvious indication for acute dialysis.  Potassium 4.4.  BUN 58.  Bicarb is 22.  No acute anemia.  Does not appear to be significantly fluid overloaded.  Oral trial here.  Chest x-ray independently interpreted by me without significant fluid overload.  Patient tolerated by mouth without issue.  Will discharge home.  PCP follow-up.  5:22 PM:  I have discussed the diagnosis/risks/treatment options with the patient.  Evaluation and diagnostic testing in the emergency department does not suggest an emergent condition requiring admission or immediate intervention beyond what has been performed at this time.  They will follow up with PCP. We also discussed returning to the ED immediately if new or worsening sx occur. We discussed the sx which are most concerning (e.g., sudden worsening pain, fever, inability to tolerate by mouth) that necessitate immediate return. Medications administered to the patient during their visit and any new prescriptions provided to the patient are listed below.  Medications given during this visit Medications  oxyCODONE  (Oxy IR/ROXICODONE ) immediate release tablet 5 mg (5 mg Oral Given 08/13/23 1555)  ondansetron  (ZOFRAN -ODT) disintegrating tablet 4 mg (4 mg Oral Given 08/13/23 1557)     The patient appears reasonably screen and/or stabilized for discharge and I doubt any other medical condition or other [facility] requiring further screening, evaluation, or treatment in the ED at this time prior to discharge.          Final Clinical Impression(s) / ED Diagnoses Final diagnoses:  Other fatigue    Rx / DC Orders ED Discharge Orders          Ordered    ondansetron  (ZOFRAN -ODT) 4 MG disintegrating tablet        08/13/23 1721              Albertus Hughs, DO 08/13/23 1722

## 2023-08-13 NOTE — ED Triage Notes (Signed)
 Pt BIB by EMS for generalized weakness. Missed recent dialysis treatment yesterday. Endorses lightheadedness while ambulating and ABD distention.  EMS VS: BP 138/72 HR 78 CBG 167

## 2023-08-14 DIAGNOSIS — Z992 Dependence on renal dialysis: Secondary | ICD-10-CM | POA: Diagnosis not present

## 2023-08-14 DIAGNOSIS — D509 Iron deficiency anemia, unspecified: Secondary | ICD-10-CM | POA: Diagnosis not present

## 2023-08-14 DIAGNOSIS — E1129 Type 2 diabetes mellitus with other diabetic kidney complication: Secondary | ICD-10-CM | POA: Diagnosis not present

## 2023-08-14 DIAGNOSIS — R197 Diarrhea, unspecified: Secondary | ICD-10-CM | POA: Diagnosis not present

## 2023-08-14 DIAGNOSIS — N2581 Secondary hyperparathyroidism of renal origin: Secondary | ICD-10-CM | POA: Diagnosis not present

## 2023-08-14 DIAGNOSIS — R52 Pain, unspecified: Secondary | ICD-10-CM | POA: Diagnosis not present

## 2023-08-14 DIAGNOSIS — D631 Anemia in chronic kidney disease: Secondary | ICD-10-CM | POA: Diagnosis not present

## 2023-08-14 DIAGNOSIS — N186 End stage renal disease: Secondary | ICD-10-CM | POA: Diagnosis not present

## 2023-08-16 DIAGNOSIS — R197 Diarrhea, unspecified: Secondary | ICD-10-CM | POA: Diagnosis not present

## 2023-08-16 DIAGNOSIS — R52 Pain, unspecified: Secondary | ICD-10-CM | POA: Diagnosis not present

## 2023-08-16 DIAGNOSIS — D509 Iron deficiency anemia, unspecified: Secondary | ICD-10-CM | POA: Diagnosis not present

## 2023-08-16 DIAGNOSIS — E1129 Type 2 diabetes mellitus with other diabetic kidney complication: Secondary | ICD-10-CM | POA: Diagnosis not present

## 2023-08-16 DIAGNOSIS — D631 Anemia in chronic kidney disease: Secondary | ICD-10-CM | POA: Diagnosis not present

## 2023-08-16 DIAGNOSIS — Z992 Dependence on renal dialysis: Secondary | ICD-10-CM | POA: Diagnosis not present

## 2023-08-16 DIAGNOSIS — N2581 Secondary hyperparathyroidism of renal origin: Secondary | ICD-10-CM | POA: Diagnosis not present

## 2023-08-16 DIAGNOSIS — N186 End stage renal disease: Secondary | ICD-10-CM | POA: Diagnosis not present

## 2023-08-19 DIAGNOSIS — Z992 Dependence on renal dialysis: Secondary | ICD-10-CM | POA: Diagnosis not present

## 2023-08-19 DIAGNOSIS — E1129 Type 2 diabetes mellitus with other diabetic kidney complication: Secondary | ICD-10-CM | POA: Diagnosis not present

## 2023-08-19 DIAGNOSIS — N2581 Secondary hyperparathyroidism of renal origin: Secondary | ICD-10-CM | POA: Diagnosis not present

## 2023-08-19 DIAGNOSIS — D509 Iron deficiency anemia, unspecified: Secondary | ICD-10-CM | POA: Diagnosis not present

## 2023-08-19 DIAGNOSIS — N186 End stage renal disease: Secondary | ICD-10-CM | POA: Diagnosis not present

## 2023-08-19 DIAGNOSIS — R197 Diarrhea, unspecified: Secondary | ICD-10-CM | POA: Diagnosis not present

## 2023-08-19 DIAGNOSIS — D631 Anemia in chronic kidney disease: Secondary | ICD-10-CM | POA: Diagnosis not present

## 2023-08-19 DIAGNOSIS — R52 Pain, unspecified: Secondary | ICD-10-CM | POA: Diagnosis not present

## 2023-08-21 DIAGNOSIS — R197 Diarrhea, unspecified: Secondary | ICD-10-CM | POA: Diagnosis not present

## 2023-08-21 DIAGNOSIS — N2581 Secondary hyperparathyroidism of renal origin: Secondary | ICD-10-CM | POA: Diagnosis not present

## 2023-08-21 DIAGNOSIS — E1129 Type 2 diabetes mellitus with other diabetic kidney complication: Secondary | ICD-10-CM | POA: Diagnosis not present

## 2023-08-21 DIAGNOSIS — D509 Iron deficiency anemia, unspecified: Secondary | ICD-10-CM | POA: Diagnosis not present

## 2023-08-21 DIAGNOSIS — D631 Anemia in chronic kidney disease: Secondary | ICD-10-CM | POA: Diagnosis not present

## 2023-08-21 DIAGNOSIS — R52 Pain, unspecified: Secondary | ICD-10-CM | POA: Diagnosis not present

## 2023-08-21 DIAGNOSIS — N186 End stage renal disease: Secondary | ICD-10-CM | POA: Diagnosis not present

## 2023-08-21 DIAGNOSIS — Z992 Dependence on renal dialysis: Secondary | ICD-10-CM | POA: Diagnosis not present

## 2023-08-23 DIAGNOSIS — D509 Iron deficiency anemia, unspecified: Secondary | ICD-10-CM | POA: Diagnosis not present

## 2023-08-23 DIAGNOSIS — N186 End stage renal disease: Secondary | ICD-10-CM | POA: Diagnosis not present

## 2023-08-23 DIAGNOSIS — N2581 Secondary hyperparathyroidism of renal origin: Secondary | ICD-10-CM | POA: Diagnosis not present

## 2023-08-23 DIAGNOSIS — D631 Anemia in chronic kidney disease: Secondary | ICD-10-CM | POA: Diagnosis not present

## 2023-08-23 DIAGNOSIS — E1129 Type 2 diabetes mellitus with other diabetic kidney complication: Secondary | ICD-10-CM | POA: Diagnosis not present

## 2023-08-23 DIAGNOSIS — R52 Pain, unspecified: Secondary | ICD-10-CM | POA: Diagnosis not present

## 2023-08-23 DIAGNOSIS — R197 Diarrhea, unspecified: Secondary | ICD-10-CM | POA: Diagnosis not present

## 2023-08-23 DIAGNOSIS — Z992 Dependence on renal dialysis: Secondary | ICD-10-CM | POA: Diagnosis not present

## 2023-08-23 DIAGNOSIS — E1122 Type 2 diabetes mellitus with diabetic chronic kidney disease: Secondary | ICD-10-CM | POA: Diagnosis not present

## 2023-08-26 DIAGNOSIS — N2581 Secondary hyperparathyroidism of renal origin: Secondary | ICD-10-CM | POA: Diagnosis not present

## 2023-08-26 DIAGNOSIS — N186 End stage renal disease: Secondary | ICD-10-CM | POA: Diagnosis not present

## 2023-08-26 DIAGNOSIS — R52 Pain, unspecified: Secondary | ICD-10-CM | POA: Diagnosis not present

## 2023-08-26 DIAGNOSIS — Z992 Dependence on renal dialysis: Secondary | ICD-10-CM | POA: Diagnosis not present

## 2023-08-26 DIAGNOSIS — D509 Iron deficiency anemia, unspecified: Secondary | ICD-10-CM | POA: Diagnosis not present

## 2023-08-26 DIAGNOSIS — E876 Hypokalemia: Secondary | ICD-10-CM | POA: Diagnosis not present

## 2023-08-26 DIAGNOSIS — D631 Anemia in chronic kidney disease: Secondary | ICD-10-CM | POA: Diagnosis not present

## 2023-08-28 DIAGNOSIS — E876 Hypokalemia: Secondary | ICD-10-CM | POA: Diagnosis not present

## 2023-08-28 DIAGNOSIS — D631 Anemia in chronic kidney disease: Secondary | ICD-10-CM | POA: Diagnosis not present

## 2023-08-28 DIAGNOSIS — N2581 Secondary hyperparathyroidism of renal origin: Secondary | ICD-10-CM | POA: Diagnosis not present

## 2023-08-28 DIAGNOSIS — D509 Iron deficiency anemia, unspecified: Secondary | ICD-10-CM | POA: Diagnosis not present

## 2023-08-28 DIAGNOSIS — R52 Pain, unspecified: Secondary | ICD-10-CM | POA: Diagnosis not present

## 2023-08-28 DIAGNOSIS — N186 End stage renal disease: Secondary | ICD-10-CM | POA: Diagnosis not present

## 2023-08-28 DIAGNOSIS — Z992 Dependence on renal dialysis: Secondary | ICD-10-CM | POA: Diagnosis not present

## 2023-08-30 DIAGNOSIS — D509 Iron deficiency anemia, unspecified: Secondary | ICD-10-CM | POA: Diagnosis not present

## 2023-08-30 DIAGNOSIS — E876 Hypokalemia: Secondary | ICD-10-CM | POA: Diagnosis not present

## 2023-08-30 DIAGNOSIS — Z992 Dependence on renal dialysis: Secondary | ICD-10-CM | POA: Diagnosis not present

## 2023-08-30 DIAGNOSIS — N2581 Secondary hyperparathyroidism of renal origin: Secondary | ICD-10-CM | POA: Diagnosis not present

## 2023-08-30 DIAGNOSIS — N186 End stage renal disease: Secondary | ICD-10-CM | POA: Diagnosis not present

## 2023-08-30 DIAGNOSIS — R52 Pain, unspecified: Secondary | ICD-10-CM | POA: Diagnosis not present

## 2023-08-30 DIAGNOSIS — D631 Anemia in chronic kidney disease: Secondary | ICD-10-CM | POA: Diagnosis not present

## 2023-09-02 ENCOUNTER — Encounter (HOSPITAL_COMMUNITY): Payer: Self-pay

## 2023-09-02 ENCOUNTER — Other Ambulatory Visit: Payer: Self-pay

## 2023-09-02 ENCOUNTER — Emergency Department (HOSPITAL_COMMUNITY)

## 2023-09-02 ENCOUNTER — Emergency Department (HOSPITAL_COMMUNITY)
Admission: EM | Admit: 2023-09-02 | Discharge: 2023-09-02 | Disposition: A | Discharge status: Home / Self Care | Attending: Emergency Medicine | Admitting: Emergency Medicine

## 2023-09-02 DIAGNOSIS — Z992 Dependence on renal dialysis: Secondary | ICD-10-CM | POA: Diagnosis not present

## 2023-09-02 DIAGNOSIS — I120 Hypertensive chronic kidney disease with stage 5 chronic kidney disease or end stage renal disease: Secondary | ICD-10-CM | POA: Diagnosis not present

## 2023-09-02 DIAGNOSIS — K7689 Other specified diseases of liver: Secondary | ICD-10-CM | POA: Diagnosis not present

## 2023-09-02 DIAGNOSIS — R0989 Other specified symptoms and signs involving the circulatory and respiratory systems: Secondary | ICD-10-CM | POA: Diagnosis not present

## 2023-09-02 DIAGNOSIS — R5383 Other fatigue: Secondary | ICD-10-CM

## 2023-09-02 DIAGNOSIS — R531 Weakness: Secondary | ICD-10-CM | POA: Diagnosis not present

## 2023-09-02 DIAGNOSIS — E1122 Type 2 diabetes mellitus with diabetic chronic kidney disease: Secondary | ICD-10-CM | POA: Diagnosis not present

## 2023-09-02 DIAGNOSIS — I2510 Atherosclerotic heart disease of native coronary artery without angina pectoris: Secondary | ICD-10-CM | POA: Diagnosis not present

## 2023-09-02 DIAGNOSIS — R109 Unspecified abdominal pain: Secondary | ICD-10-CM | POA: Diagnosis not present

## 2023-09-02 DIAGNOSIS — R0602 Shortness of breath: Secondary | ICD-10-CM | POA: Insufficient documentation

## 2023-09-02 DIAGNOSIS — E1140 Type 2 diabetes mellitus with diabetic neuropathy, unspecified: Secondary | ICD-10-CM | POA: Diagnosis not present

## 2023-09-02 DIAGNOSIS — N186 End stage renal disease: Secondary | ICD-10-CM | POA: Insufficient documentation

## 2023-09-02 DIAGNOSIS — J984 Other disorders of lung: Secondary | ICD-10-CM | POA: Diagnosis not present

## 2023-09-02 DIAGNOSIS — Z79899 Other long term (current) drug therapy: Secondary | ICD-10-CM | POA: Diagnosis not present

## 2023-09-02 DIAGNOSIS — R059 Cough, unspecified: Secondary | ICD-10-CM | POA: Diagnosis present

## 2023-09-02 LAB — BRAIN NATRIURETIC PEPTIDE: B Natriuretic Peptide: >4500 pg/mL — ABNORMAL HIGH (ref 0.0–100.0)

## 2023-09-02 LAB — COMPREHENSIVE METABOLIC PANEL WITH GFR
ALT: 13 U/L (ref 0–44)
AST: 15 U/L (ref 15–41)
Albumin: 4 g/dL (ref 3.5–5.0)
Alkaline Phosphatase: 113 U/L (ref 38–126)
Anion gap: 13 (ref 5–15)
BUN: 31 mg/dL — ABNORMAL HIGH (ref 8–23)
CO2: 29 mmol/L (ref 22–32)
Calcium: 9.5 mg/dL (ref 8.9–10.3)
Chloride: 94 mmol/L — ABNORMAL LOW (ref 98–111)
Creatinine, Ser: 12.02 mg/dL — ABNORMAL HIGH (ref 0.61–1.24)
GFR, Estimated: 4 mL/min — ABNORMAL LOW (ref >60)
Glucose, Bld: 105 mg/dL — ABNORMAL HIGH (ref 70–99)
Potassium: 4.3 mmol/L (ref 3.5–5.1)
Sodium: 136 mmol/L (ref 135–145)
Total Bilirubin: 1 mg/dL (ref 0.0–1.2)
Total Protein: 7.9 g/dL (ref 6.5–8.1)

## 2023-09-02 LAB — CBC
HCT: 32.3 % — ABNORMAL LOW (ref 39.0–52.0)
Hemoglobin: 10.4 g/dL — ABNORMAL LOW (ref 13.0–17.0)
MCH: 33.5 pg (ref 26.0–34.0)
MCHC: 32.2 g/dL (ref 30.0–36.0)
MCV: 104.2 fL — ABNORMAL HIGH (ref 80.0–100.0)
Platelets: 131 10*3/uL — ABNORMAL LOW (ref 150–400)
RBC: 3.1 MIL/uL — ABNORMAL LOW (ref 4.22–5.81)
RDW: 14 % (ref 11.5–15.5)
WBC: 4.7 10*3/uL (ref 4.0–10.5)
nRBC: 0 % (ref 0.0–0.2)

## 2023-09-02 LAB — LIPASE, BLOOD: Lipase: 30 U/L (ref 11–51)

## 2023-09-02 LAB — TROPONIN I (HIGH SENSITIVITY)
Troponin I (High Sensitivity): 23 ng/L — ABNORMAL HIGH (ref <18)
Troponin I (High Sensitivity): 20 ng/L — ABNORMAL HIGH (ref <18)

## 2023-09-02 MED ORDER — IOHEXOL 350 MG/ML SOLN
75.0000 mL | Freq: Once | INTRAVENOUS | Status: AC | PRN
  Administered 2023-09-02: 75 mL via INTRAVENOUS

## 2023-09-02 MED ORDER — MAALOX MAX 400-400-40 MG/5ML PO SUSP: 15.0000 mL | Freq: Four times a day (QID) | ORAL | 0 refills | Status: DC | PRN

## 2023-09-02 MED ORDER — ONDANSETRON 4 MG PO TBDP: ORAL_TABLET | ORAL | 0 refills | Status: DC

## 2023-09-02 NOTE — ED Notes (Signed)
 Pt away for CT.

## 2023-09-02 NOTE — Discharge Instructions (Addendum)
 We evaluated you for your fatigue and weakness.  Your testing in the emergency department was reassuring.  We did not find a dangerous cause of your symptoms today.  Please follow-up closely with your primary physician for further care.  Please call your dialysis center to let them know that you missed dialysis today

## 2023-09-02 NOTE — Progress Notes (Signed)
 Heart Failure Navigator Progress Note  Assessed for Heart & Vascular TOC clinic readiness.  Patient does not meet criteria due to ESRD on hemodialysis.   Navigator will sign off at this time.   Rhae Hammock, BSN, Scientist, clinical (histocompatibility and immunogenetics) Only

## 2023-09-02 NOTE — ED Provider Notes (Signed)
 [place] EMERGENCY DEPARTMENT AT [facility] Provider Note   CSN: [id number] Arrival date & time: 09/02/23  0425     History  Chief Complaint  Patient presents with   Weakness   Shortness of Breath        Abdominal Pain    Jonathan Mcneil is a 68 y.o. male.  Patient with a history of diabetes, hypertension, ESRD on dialysis, GERD, neuropathy here with difficulty breathing and coughing for the past 3 days.  Feels generally fatigued.  Symptoms started after his dialysis session on Saturday.  States this happens infrequently but he is due for dialysis this morning but did not think he could make it due to feeling dizzy and lightheaded when trying to have a bowel movement.  Believes he has been constipated.  Has not had any vomiting but does have diffuse abdominal pain for the past 3 days.  Denies any missed dialysis sessions.  Does not make any urine.  He believes his difficulty breathing started after he smoked some marijuana several days ago.  He does not do this routinely.  Does not smoke cigarettes.  No history of asthma or COPD.  No fever. Denies any focal weakness, numbness or tingling.  Denies any headache.  He feels generally weak and short of breath and is having abdominal pain but no chest pain.  The history is provided by the patient.  Weakness Associated symptoms: abdominal pain, cough and shortness of breath   Associated symptoms: no arthralgias, no chest pain, no dizziness, no dysuria, no fever, no headaches, no myalgias, no nausea and no vomiting   Shortness of Breath Associated symptoms: abdominal pain and cough   Associated symptoms: no chest pain, no fever, no headaches, no rash and no vomiting   Abdominal Pain Associated symptoms: constipation, cough, fatigue and shortness of breath   Associated symptoms: no chest pain, no dysuria, no fever, no hematuria, no nausea and no vomiting        Home Medications Prior to Admission medications    Medication Sig Start Date End Date Taking? Authorizing Provider  atorvastatin (LIPITOR) 20 MG tablet Take 20 mg by mouth daily.    [provider]  ENTRESTO 24-26 MG Take 1 tablet by mouth 2 (two) times daily. 10/11/21   [provider]  gabapentin  (NEURONTIN ) 300 MG capsule Take 300 mg by mouth at bedtime. 09/20/20   [provider]  loperamide  (IMODIUM ) 2 MG capsule Take 1 capsule (2 mg total) by mouth 4 (four) times daily as needed for diarrhea or loose stools. 10/23/21   Long, Shereen Dike, MD  methocarbamol  (ROBAXIN ) 500 MG tablet Take 1 tablet (500 mg total) by mouth every 8 (eight) hours as needed for muscle spasms. 08/12/22   Iva Mariner, MD  metoprolol  succinate (TOPROL -XL) 50 MG 24 hr tablet Take 1 tablet (50 mg total) by mouth daily. 05/28/23   Lauralee Poll, MD  ondansetron  (ZOFRAN -ODT) 4 MG disintegrating tablet 4mg  ODT q4 hours prn nausea/vomit 08/13/23   Floyd, Dan, DO  pantoprazole  (PROTONIX ) 40 MG tablet Take 1 tablet (40 mg total) by mouth 2 (two) times daily before a meal. Twice a day x 4 weeks, then daily 06/17/21   [name], [name], MD  sertraline  (ZOLOFT ) 25 MG tablet Take 25 mg by mouth 3 (three) times a week. M W F 11/14/20   [provider]  sucroferric oxyhydroxide (VELPHORO ) 500 MG chewable tablet Chew 500 mg by mouth 3 (three) times daily with meals.  [provider]  traMADol  (ULTRAM ) 50 MG tablet Take 25 mg by mouth as needed. 3 x weekly after dialysis as needed for pain    [provider]      Allergies    Gabapentin , Ibuprofen, Penicillins, and Venlafaxine    Review of Systems   Review of Systems  Constitutional:  Positive for fatigue. Negative for activity change, appetite change and fever.  HENT:  Negative for congestion.   Respiratory:  Positive for cough and shortness of breath. Negative for chest tightness.   Cardiovascular:  Negative for chest pain.  Gastrointestinal:  Positive for abdominal pain and  constipation. Negative for nausea and vomiting.  Genitourinary:  Negative for dysuria and hematuria.  Musculoskeletal:  Negative for arthralgias and myalgias.  Skin:  Negative for rash.  Neurological:  Positive for weakness. Negative for dizziness and headaches.   all other systems are negative except as noted in the HPI and PMH.    Physical Exam Updated Vital Signs BP (!) 94/46   Pulse 64   Temp (!) 97.5 F (36.4 C) (Oral)   Resp 20   Ht 5\' 9"  (1.753 m)   Wt 99.8 kg   SpO2 100%   BMI 32.49 kg/m  Physical Exam Vitals and nursing note reviewed.  Constitutional:      General: He is not in acute distress.    Appearance: He is well-developed. He is obese.     Comments: No respiratory distress, speaking in full sentences  HENT:     Head: Normocephalic and atraumatic.     Mouth/Throat:     Pharynx: No oropharyngeal exudate.  Eyes:     Conjunctiva/sclera: Conjunctivae normal.     Pupils: Pupils are equal, round, and reactive to light.  Neck:     Comments: No meningismus. Cardiovascular:     Rate and Rhythm: Normal rate and regular rhythm.     Heart sounds: Normal heart sounds. No murmur heard. Pulmonary:     Effort: Pulmonary effort is normal. No respiratory distress.     Breath sounds: Normal breath sounds.  Abdominal:     Palpations: Abdomen is soft.     Tenderness: There is abdominal tenderness. There is no guarding or rebound.     Comments: Diffuse abdominal tenderness with voluntary guarding throughout  Musculoskeletal:        General: No tenderness. Normal range of motion.     Cervical back: Normal range of motion and neck supple.     Comments: Dialysis fistula left upper extremity with thrill present  Skin:    General: Skin is warm.  Neurological:     Mental Status: He is alert and oriented to person, place, and time.     Cranial Nerves: No cranial nerve deficit.     Motor: No abnormal muscle tone.     Coordination: Coordination normal.     Comments:  5/5  strength throughout. CN 2-12 intact.Equal grip strength.   Psychiatric:        Behavior: Behavior normal.     ED Results / Procedures / Treatments   Labs (all labs ordered are listed, but only abnormal results are displayed) Labs Reviewed  COMPREHENSIVE METABOLIC PANEL WITH GFR - Abnormal; Notable for the following components:      Result Value   Chloride 94 (*)    Glucose, Bld 105 (*)    BUN 31 (*)    Creatinine, Ser 12.02 (*)    GFR, Estimated 4 (*)    All other components  within normal limits  CBC - Abnormal; Notable for the following components:   RBC 3.10 (*)    Hemoglobin 10.4 (*)    HCT 32.3 (*)    MCV 104.2 (*)    Platelets 131 (*)    All other components within normal limits  BRAIN NATRIURETIC PEPTIDE - Abnormal; Notable for the following components:   B Natriuretic Peptide >4,500.0 (*)    All other components within normal limits  TROPONIN I (HIGH SENSITIVITY) - Abnormal; Notable for the following components:   Troponin I (High Sensitivity) 23 (*)    All other components within normal limits  LIPASE, BLOOD  TROPONIN I (HIGH SENSITIVITY)    EKG EKG Interpretation Date/Time:  Tuesday September 02 2023 04:35:05 EDT Ventricular Rate:  63 PR Interval:  170 QRS Duration:  98 QT Interval:  468 QTC Calculation: 478 R Axis:   80  Text Interpretation: Normal sinus rhythm Normal ECG When compared with ECG of 13-Aug-2023 13:08, PREVIOUS ECG IS PRESENT No significant change was found Confirmed by Earma Gloss [phone]) on 09/02/2023 6:34:50 AM  Radiology DG Chest 2 View Result Date: 09/02/2023 CLINICAL DATA:  Shortness of breath and weakness. EXAM: CHEST - 2 VIEW COMPARISON:  08/13/2023 FINDINGS: Low volume film. The cardio pericardial silhouette is enlarged. There is pulmonary vascular congestion without overt pulmonary edema. The lungs are clear without focal pneumonia, edema, pneumothorax or pleural effusion. No acute bony abnormality. IMPRESSION: Low volume film with  pulmonary vascular congestion. Electronically Signed   By: Donnal Fusi M.D.   On: 09/02/2023 05:17    Procedures Procedures    Medications Ordered in ED Medications - No data to display  ED Course/ Medical Decision Making/ A&P Clinical Course as of 09/02/23 0740  Tue Sep 02, 2023  0715 Received sign out from Dr. Alison Irvine, presenting with shortness of breath, abdominal pain [WS]    Clinical Course User Index [WS] Mordecai Applebaum, MD                                 Medical Decision Making Amount and/or Complexity of Data Reviewed Labs: ordered. Decision-making details documented in ED Course. Radiology: ordered and independent interpretation performed. Decision-making details documented in ED Course. ECG/medicine tests: ordered and independent interpretation performed. Decision-making details documented in ED Course.   ESRD patient due for dialysis this morning here with shortness of breath, generalized weakness, abdominal pain, constipation.  No chest pain or fever.  EKG is sinus rhythm without acute ST changes.  He is in no respiratory distress and speaking full sentences with clear lungs.  Labs in triage show stable anemia.  BNP elevated.  Troponin 23.  Mild vascular congestion on chest x-ray.  No increased work of breathing or hypoxia.  Labs show minimal troponin elevation.  Not consistent with ACS.  Will need to trend.  Given his abdominal pain and distention will obtain CT imaging as well as rule out pulmonary embolism or pneumonia.  If the studies are reassuring and patient is able to ambulate without desaturation and has flat troponins he may be able to go to outpatient dialysis  Care transferred at shift change to Dr. Isaiah Marc.         Final Clinical Impression(s) / ED Diagnoses Final diagnoses:  None    Rx / DC Orders ED Discharge Orders     None         Adrea Sherpa, Mara [place], MD 09/02/23 [phone]

## 2023-09-02 NOTE — ED Triage Notes (Signed)
 Reports went to his dialysis on saturdya and has been weak and fatigue since.  Reports he almost passed out this morning while trying to use the bathroom.  Unsure if he is constipated.  Complains of abd pain sob and weakness.  Reports he has not missed an dialysis treatments

## 2023-09-04 DIAGNOSIS — R52 Pain, unspecified: Secondary | ICD-10-CM | POA: Diagnosis not present

## 2023-09-04 DIAGNOSIS — D631 Anemia in chronic kidney disease: Secondary | ICD-10-CM | POA: Diagnosis not present

## 2023-09-04 DIAGNOSIS — N2581 Secondary hyperparathyroidism of renal origin: Secondary | ICD-10-CM | POA: Diagnosis not present

## 2023-09-04 DIAGNOSIS — E876 Hypokalemia: Secondary | ICD-10-CM | POA: Diagnosis not present

## 2023-09-04 DIAGNOSIS — N186 End stage renal disease: Secondary | ICD-10-CM | POA: Diagnosis not present

## 2023-09-04 DIAGNOSIS — Z992 Dependence on renal dialysis: Secondary | ICD-10-CM | POA: Diagnosis not present

## 2023-09-04 DIAGNOSIS — D509 Iron deficiency anemia, unspecified: Secondary | ICD-10-CM | POA: Diagnosis not present

## 2023-09-06 DIAGNOSIS — N186 End stage renal disease: Secondary | ICD-10-CM | POA: Diagnosis not present

## 2023-09-06 DIAGNOSIS — N2581 Secondary hyperparathyroidism of renal origin: Secondary | ICD-10-CM | POA: Diagnosis not present

## 2023-09-06 DIAGNOSIS — D509 Iron deficiency anemia, unspecified: Secondary | ICD-10-CM | POA: Diagnosis not present

## 2023-09-06 DIAGNOSIS — E876 Hypokalemia: Secondary | ICD-10-CM | POA: Diagnosis not present

## 2023-09-06 DIAGNOSIS — R52 Pain, unspecified: Secondary | ICD-10-CM | POA: Diagnosis not present

## 2023-09-06 DIAGNOSIS — Z992 Dependence on renal dialysis: Secondary | ICD-10-CM | POA: Diagnosis not present

## 2023-09-06 DIAGNOSIS — D631 Anemia in chronic kidney disease: Secondary | ICD-10-CM | POA: Diagnosis not present

## 2023-09-09 DIAGNOSIS — E876 Hypokalemia: Secondary | ICD-10-CM | POA: Diagnosis not present

## 2023-09-09 DIAGNOSIS — D631 Anemia in chronic kidney disease: Secondary | ICD-10-CM | POA: Diagnosis not present

## 2023-09-09 DIAGNOSIS — R52 Pain, unspecified: Secondary | ICD-10-CM | POA: Diagnosis not present

## 2023-09-09 DIAGNOSIS — N2581 Secondary hyperparathyroidism of renal origin: Secondary | ICD-10-CM | POA: Diagnosis not present

## 2023-09-09 DIAGNOSIS — D509 Iron deficiency anemia, unspecified: Secondary | ICD-10-CM | POA: Diagnosis not present

## 2023-09-09 DIAGNOSIS — N186 End stage renal disease: Secondary | ICD-10-CM | POA: Diagnosis not present

## 2023-09-09 DIAGNOSIS — Z992 Dependence on renal dialysis: Secondary | ICD-10-CM | POA: Diagnosis not present

## 2023-09-11 ENCOUNTER — Other Ambulatory Visit: Payer: Self-pay

## 2023-09-11 ENCOUNTER — Emergency Department (HOSPITAL_COMMUNITY)
Admission: EM | Admit: 2023-09-11 | Discharge: 2023-09-11 | Disposition: A | Discharge status: Home / Self Care | Attending: Emergency Medicine | Admitting: Emergency Medicine

## 2023-09-11 ENCOUNTER — Encounter (HOSPITAL_COMMUNITY): Payer: Self-pay | Admitting: Emergency Medicine

## 2023-09-11 ENCOUNTER — Emergency Department (HOSPITAL_COMMUNITY)

## 2023-09-11 DIAGNOSIS — I672 Cerebral atherosclerosis: Secondary | ICD-10-CM | POA: Diagnosis not present

## 2023-09-11 DIAGNOSIS — E876 Hypokalemia: Secondary | ICD-10-CM | POA: Diagnosis not present

## 2023-09-11 DIAGNOSIS — G4489 Other headache syndrome: Secondary | ICD-10-CM | POA: Diagnosis not present

## 2023-09-11 DIAGNOSIS — D509 Iron deficiency anemia, unspecified: Secondary | ICD-10-CM | POA: Diagnosis not present

## 2023-09-11 DIAGNOSIS — D72819 Decreased white blood cell count, unspecified: Secondary | ICD-10-CM | POA: Insufficient documentation

## 2023-09-11 DIAGNOSIS — Z743 Need for continuous supervision: Secondary | ICD-10-CM | POA: Diagnosis not present

## 2023-09-11 DIAGNOSIS — N186 End stage renal disease: Secondary | ICD-10-CM

## 2023-09-11 DIAGNOSIS — N189 Chronic kidney disease, unspecified: Secondary | ICD-10-CM | POA: Insufficient documentation

## 2023-09-11 DIAGNOSIS — D631 Anemia in chronic kidney disease: Secondary | ICD-10-CM | POA: Diagnosis not present

## 2023-09-11 DIAGNOSIS — N2581 Secondary hyperparathyroidism of renal origin: Secondary | ICD-10-CM | POA: Diagnosis not present

## 2023-09-11 DIAGNOSIS — R0602 Shortness of breath: Secondary | ICD-10-CM | POA: Diagnosis not present

## 2023-09-11 DIAGNOSIS — Z992 Dependence on renal dialysis: Secondary | ICD-10-CM | POA: Insufficient documentation

## 2023-09-11 DIAGNOSIS — I120 Hypertensive chronic kidney disease with stage 5 chronic kidney disease or end stage renal disease: Secondary | ICD-10-CM | POA: Diagnosis not present

## 2023-09-11 DIAGNOSIS — R519 Headache, unspecified: Secondary | ICD-10-CM | POA: Diagnosis not present

## 2023-09-11 DIAGNOSIS — R52 Pain, unspecified: Secondary | ICD-10-CM | POA: Diagnosis not present

## 2023-09-11 DIAGNOSIS — I517 Cardiomegaly: Secondary | ICD-10-CM | POA: Diagnosis not present

## 2023-09-11 DIAGNOSIS — R531 Weakness: Secondary | ICD-10-CM | POA: Diagnosis not present

## 2023-09-11 DIAGNOSIS — I6782 Cerebral ischemia: Secondary | ICD-10-CM | POA: Diagnosis not present

## 2023-09-11 LAB — CBC
HCT: 32 % — ABNORMAL LOW (ref 39.0–52.0)
Hemoglobin: 10.2 g/dL — ABNORMAL LOW (ref 13.0–17.0)
MCH: 33.2 pg (ref 26.0–34.0)
MCHC: 31.9 g/dL (ref 30.0–36.0)
MCV: 104.2 fL — ABNORMAL HIGH (ref 80.0–100.0)
Platelets: 132 10*3/uL — ABNORMAL LOW (ref 150–400)
RBC: 3.07 MIL/uL — ABNORMAL LOW (ref 4.22–5.81)
RDW: 14.4 % (ref 11.5–15.5)
WBC: 3.4 10*3/uL — ABNORMAL LOW (ref 4.0–10.5)
nRBC: 0 % (ref 0.0–0.2)

## 2023-09-11 LAB — COMPREHENSIVE METABOLIC PANEL WITH GFR
ALT: 12 U/L (ref 0–44)
AST: 15 U/L (ref 15–41)
Albumin: 3.8 g/dL (ref 3.5–5.0)
Alkaline Phosphatase: 103 U/L (ref 38–126)
Anion gap: 18 — ABNORMAL HIGH (ref 5–15)
BUN: 19 mg/dL (ref 8–23)
CO2: 23 mmol/L (ref 22–32)
Calcium: 9.1 mg/dL (ref 8.9–10.3)
Chloride: 95 mmol/L — ABNORMAL LOW (ref 98–111)
Creatinine, Ser: 10.43 mg/dL — ABNORMAL HIGH (ref 0.61–1.24)
GFR, Estimated: 5 mL/min — ABNORMAL LOW (ref >60)
Glucose, Bld: 118 mg/dL — ABNORMAL HIGH (ref 70–99)
Potassium: 3.7 mmol/L (ref 3.5–5.1)
Sodium: 136 mmol/L (ref 135–145)
Total Bilirubin: 0.8 mg/dL (ref 0.0–1.2)
Total Protein: 7.5 g/dL (ref 6.5–8.1)

## 2023-09-11 MED ORDER — ONDANSETRON 4 MG PO TBDP
4.0000 mg | ORAL_TABLET | Freq: Once | ORAL | Status: AC
  Administered 2023-09-11: 4 mg via ORAL
  Filled 2023-09-11: qty 1

## 2023-09-11 NOTE — ED Provider Triage Note (Signed)
 Emergency Medicine Provider Triage Evaluation Note  Jonathan Mcneil , a 68 y.o. male  was evaluated in triage.  Pt complains of weakness, shob, headache after taking his entresto tonight. [facility] dialysis, no missed sessions.  Review of Systems  Positive: Headache, shob, weakness Negative:   Physical Exam  BP 117/65 (BP Location: Right Arm)   Pulse 62   Temp 97.7 F (36.5 C) (Oral)   Resp 18   Ht 5' 9 (1.753 m)   Wt 95.3 kg   SpO2 100%   BMI 31.01 kg/m  Gen:   Awake, no distress   Resp:  Normal effort  MSK:   Moves extremities without difficulty  Other:    Medical Decision Making  Medically screening exam initiated at 1:35 AM.  Appropriate orders placed.  Jonathan Mcneil was informed that the remainder of the evaluation will be completed by another provider, this initial triage assessment does not replace that evaluation, and the importance of remaining in the ED until their evaluation is complete.  Workup initiated in triage    Nelly Banco, [place] 09/11/23 [id number]

## 2023-09-11 NOTE — Discharge Instructions (Signed)
 Return for any problem.   You should be at your dialysis center by 11:50 AM today for a make-up session.

## 2023-09-11 NOTE — ED Provider Notes (Signed)
 [place] EMERGENCY DEPARTMENT AT [facility] Provider Note   CSN: [id number] Arrival date & time: 09/11/23  0110     Patient presents with: Weakness   Jonathan Mcneil is a 68 y.o. male.   68 year old male with prior medical history as detailed below presents for evaluation.  Patient is on dialysis.  He typically goes Tuesday, Thursday, Saturday.  He denies any problems with missing dialysis.  He reports that he felt anxious and decided to come into the ED.  He is concerned that perhaps it is his Viola Greulich making me anxious.  He denies current headache, chest pain, shortness of breath, visual change, other complaint.  He is awake and alert appropriately.  He is able to stand and ambulate without difficulty.  At the time of my evaluation, he is without acute complaint.  He reports that he feels better and wants to go home.  Patient was scheduled for 5 AM today session with his dialysis center.  The history is provided by the patient.       Prior to Admission medications   Medication Sig Start Date End Date Taking? Authorizing Provider  acetaminophen  (TYLENOL ) 500 MG tablet Take 1,000 mg by mouth every 6 (six) hours as needed.    [provider]  ENTRESTO 24-26 MG Take 1 tablet by mouth 2 (two) times daily. 10/11/21   [provider]  esomeprazole (NEXIUM) 40 MG capsule Take 40 mg by mouth daily. 08/01/23   [provider]  gabapentin  (NEURONTIN ) 300 MG capsule Take 300 mg by mouth at bedtime. 09/20/20   [provider]  loperamide  (IMODIUM ) 2 MG capsule Take 1 capsule (2 mg total) by mouth 4 (four) times daily as needed for diarrhea or loose stools. 10/23/21   Long, Shereen Dike, MD  methocarbamol  (ROBAXIN ) 500 MG tablet Take 1 tablet (500 mg total) by mouth every 8 (eight) hours as needed for muscle spasms. 08/12/22   Iva Mariner, MD  metoprolol  succinate (TOPROL -XL) 50 MG 24 hr tablet Take 1 tablet (50 mg total) by mouth daily. 05/28/23    Lauralee Poll, MD  midodrine (PROAMATINE) 5 MG tablet Take 5 mg by mouth See admin instructions. 07/13/23   [provider]  ondansetron  (ZOFRAN -ODT) 4 MG disintegrating tablet 4mg  ODT q4 hours prn nausea/vomit 09/02/23   Mordecai Applebaum, MD  sertraline  (ZOLOFT ) 25 MG tablet Take 25 mg by mouth 3 (three) times a week. M W F 11/14/20   [provider]  sucroferric oxyhydroxide (VELPHORO ) 500 MG chewable tablet Chew 500 mg by mouth 3 (three) times daily with meals.    [provider]  traMADol  (ULTRAM ) 50 MG tablet Take 25 mg by mouth as needed. 3 x weekly after dialysis as needed for pain    [provider]    Allergies: Gabapentin , Ibuprofen, Penicillins, and Venlafaxine    Review of Systems  All other systems reviewed and are negative.   Updated Vital Signs BP 132/74   Pulse 63   Temp 97.9 F (36.6 C)   Resp 17   Ht 5' 9 (1.753 m)   Wt 95.3 kg   SpO2 100%   BMI 31.01 kg/m   Physical Exam Vitals and nursing note reviewed.  Constitutional:      General: He is not in acute distress.    Appearance: Normal appearance. He is well-developed.  HENT:     Head: Normocephalic and atraumatic.   Eyes:     Conjunctiva/sclera: Conjunctivae normal.  Pupils: Pupils are equal, round, and reactive to light.    Cardiovascular:     Rate and Rhythm: Normal rate and regular rhythm.     Heart sounds: Normal heart sounds.  Pulmonary:     Effort: Pulmonary effort is normal. No respiratory distress.     Breath sounds: Normal breath sounds.  Abdominal:     General: There is no distension.     Palpations: Abdomen is soft.     Tenderness: There is no abdominal tenderness.   Musculoskeletal:        General: No deformity. Normal range of motion.     Cervical back: Normal range of motion and neck supple.   Skin:    General: Skin is warm and dry.   Neurological:     General: No focal deficit present.     Mental Status: He is alert and oriented  to person, place, and time. Mental status is at baseline.     Cranial Nerves: No cranial nerve deficit.     Motor: No weakness.     (all labs ordered are listed, but only abnormal results are displayed) Labs Reviewed  CBC - Abnormal; Notable for the following components:      Result Value   WBC 3.4 (*)    RBC 3.07 (*)    Hemoglobin 10.2 (*)    HCT 32.0 (*)    MCV 104.2 (*)    Platelets 132 (*)    All other components within normal limits  COMPREHENSIVE METABOLIC PANEL WITH GFR - Abnormal; Notable for the following components:   Chloride 95 (*)    Glucose, Bld 118 (*)    Creatinine, Ser 10.43 (*)    GFR, Estimated 5 (*)    Anion gap 18 (*)    All other components within normal limits    EKG: None  Radiology: CT Head Wo Contrast Result Date: 09/11/2023 EXAM: CT HEAD WITHOUT CONTRAST 09/11/2023 02:02:13 AM TECHNIQUE: CT of the head was performed without the administration of intravenous contrast. Automated exposure control, iterative reconstruction, and/or weight based adjustment of the mA/kV was utilized to reduce the radiation dose to as low as reasonably achievable. COMPARISON: 09/19/2020 CLINICAL HISTORY: Headache, new onset (Age >= 51y). Patient reports generalized weakness and headache starting tonight after he took his entresto. T,TH,S dialysis patient - no missed sessions. FINDINGS: BRAIN AND VENTRICLES: There is no acute intracranial hemorrhage, mass effect or midline shift. No abnormal extra-axial fluid collection. The gray-white differentiation is maintained without an acute infarct. There is no hydrocephalus. Mild subcortical and periventricular small vessel ischemic changes. ORBITS: The visualized portion of the orbits demonstrate no acute abnormality. SINUSES: The visualized paranasal sinuses and mastoid air cells demonstrate no acute abnormality. SOFT TISSUES AND SKULL: No acute abnormality of the visualized skull or soft tissues. VASCULATURE: Intracranial atherosclerosis.  IMPRESSION: 1. No acute intracranial abnormality. Electronically signed by: Zadie Herter MD 09/11/2023 02:06 AM EDT RP Workstation: [id number]   DG Chest Portable 1 View Result Date: 09/11/2023 CLINICAL DATA:  Weakness with shortness of breath and headache. EXAM: PORTABLE CHEST 1 VIEW COMPARISON:  September 02, 2023 FINDINGS: The cardiac silhouette is mildly enlarged and unchanged in size. Moderate to marked severity calcification of the aortic arch is seen. There is no evidence of acute infiltrate, pleural effusion or pneumothorax. The visualized skeletal structures are unremarkable. IMPRESSION: Stable cardiomegaly without evidence of acute or active cardiopulmonary disease. Electronically Signed   By: Virgle Grime M.D.   On: 09/11/2023 01:48  Procedures   Medications Ordered in the ED  ondansetron  (ZOFRAN -ODT) disintegrating tablet 4 mg (4 mg Oral Given 09/11/23 0141)                                    Medical Decision Making   Medical Screen Complete  This patient presented to the ED with complaint of weakness, anxiety.  This complaint involves an extensive number of treatment options. The initial differential diagnosis includes, but is not limited to, metabolic abnormality, anxiety, etc.  This presentation is: Acute, Chronic, Self-Limited, Previously Undiagnosed, Uncertain Prognosis, Complicated, Systemic Symptoms, and Threat to Life/Bodily Function  Patient with multiple comorbidities including ESRD on HD presents with complaint of anxiety.  Patient complained in triage of weakness, shortness of breath, headache.  All of the symptoms has resolved during his wait time.  Workup on the whole is without evidence of significant acute pathology.  At the time of my evaluation, patient is improved.  He desires discharge.  He is without acute complaint now.  Patient's dialysis center -[facility] called and will have a chair ready for the patient at  11:50 AM for his dialysis session today.  Patient understands plan of care.  He is comfortable with discharge.  He understands that he needs to be at dialysis by 11:50AM for  make-up session.  Importance of close follow-up stressed.  Strict return precautions given understood.  Additional history obtained: External records from outside sources obtained and reviewed including prior ED visits and prior Inpatient records.    Problem List / ED Course:  ESRD on HD   Reevaluation:  After the interventions noted above, I reevaluated the patient and found that they have: resolved   Disposition:  After consideration of the diagnostic results and the patients response to treatment, I feel that the patent would benefit from close outpatient follow-up.       Final diagnoses:  ESRD (end stage renal disease) [facility])    ED Discharge Orders     None          Burnette Carte, MD 09/11/23 [phone]

## 2023-09-11 NOTE — ED Triage Notes (Signed)
 Patient reports generalized weakness and headache starting tonight after he took his entresto. T,TH,S dialysis patient - no missed sessions.   BP 138/78, HR 62, Spo2 100, CBG 133

## 2023-09-13 DIAGNOSIS — N186 End stage renal disease: Secondary | ICD-10-CM | POA: Diagnosis not present

## 2023-09-13 DIAGNOSIS — E876 Hypokalemia: Secondary | ICD-10-CM | POA: Diagnosis not present

## 2023-09-13 DIAGNOSIS — D509 Iron deficiency anemia, unspecified: Secondary | ICD-10-CM | POA: Diagnosis not present

## 2023-09-13 DIAGNOSIS — D631 Anemia in chronic kidney disease: Secondary | ICD-10-CM | POA: Diagnosis not present

## 2023-09-13 DIAGNOSIS — R52 Pain, unspecified: Secondary | ICD-10-CM | POA: Diagnosis not present

## 2023-09-13 DIAGNOSIS — N2581 Secondary hyperparathyroidism of renal origin: Secondary | ICD-10-CM | POA: Diagnosis not present

## 2023-09-13 DIAGNOSIS — Z992 Dependence on renal dialysis: Secondary | ICD-10-CM | POA: Diagnosis not present

## 2023-09-16 DIAGNOSIS — D509 Iron deficiency anemia, unspecified: Secondary | ICD-10-CM | POA: Diagnosis not present

## 2023-09-16 DIAGNOSIS — N2581 Secondary hyperparathyroidism of renal origin: Secondary | ICD-10-CM | POA: Diagnosis not present

## 2023-09-16 DIAGNOSIS — R52 Pain, unspecified: Secondary | ICD-10-CM | POA: Diagnosis not present

## 2023-09-16 DIAGNOSIS — E876 Hypokalemia: Secondary | ICD-10-CM | POA: Diagnosis not present

## 2023-09-16 DIAGNOSIS — N186 End stage renal disease: Secondary | ICD-10-CM | POA: Diagnosis not present

## 2023-09-16 DIAGNOSIS — D631 Anemia in chronic kidney disease: Secondary | ICD-10-CM | POA: Diagnosis not present

## 2023-09-16 DIAGNOSIS — Z992 Dependence on renal dialysis: Secondary | ICD-10-CM | POA: Diagnosis not present

## 2023-09-18 DIAGNOSIS — E876 Hypokalemia: Secondary | ICD-10-CM | POA: Diagnosis not present

## 2023-09-18 DIAGNOSIS — Z992 Dependence on renal dialysis: Secondary | ICD-10-CM | POA: Diagnosis not present

## 2023-09-18 DIAGNOSIS — E1142 Type 2 diabetes mellitus with diabetic polyneuropathy: Secondary | ICD-10-CM | POA: Diagnosis not present

## 2023-09-18 DIAGNOSIS — E782 Mixed hyperlipidemia: Secondary | ICD-10-CM | POA: Diagnosis not present

## 2023-09-18 DIAGNOSIS — I5022 Chronic systolic (congestive) heart failure: Secondary | ICD-10-CM | POA: Diagnosis not present

## 2023-09-18 DIAGNOSIS — D509 Iron deficiency anemia, unspecified: Secondary | ICD-10-CM | POA: Diagnosis not present

## 2023-09-18 DIAGNOSIS — N2581 Secondary hyperparathyroidism of renal origin: Secondary | ICD-10-CM | POA: Diagnosis not present

## 2023-09-18 DIAGNOSIS — R52 Pain, unspecified: Secondary | ICD-10-CM | POA: Diagnosis not present

## 2023-09-18 DIAGNOSIS — D631 Anemia in chronic kidney disease: Secondary | ICD-10-CM | POA: Diagnosis not present

## 2023-09-18 DIAGNOSIS — N186 End stage renal disease: Secondary | ICD-10-CM | POA: Diagnosis not present

## 2023-09-18 DIAGNOSIS — Z79899 Other long term (current) drug therapy: Secondary | ICD-10-CM | POA: Diagnosis not present

## 2023-09-18 DIAGNOSIS — R0602 Shortness of breath: Secondary | ICD-10-CM | POA: Diagnosis not present

## 2023-09-20 DIAGNOSIS — R52 Pain, unspecified: Secondary | ICD-10-CM | POA: Diagnosis not present

## 2023-09-20 DIAGNOSIS — N2581 Secondary hyperparathyroidism of renal origin: Secondary | ICD-10-CM | POA: Diagnosis not present

## 2023-09-20 DIAGNOSIS — Z992 Dependence on renal dialysis: Secondary | ICD-10-CM | POA: Diagnosis not present

## 2023-09-20 DIAGNOSIS — D509 Iron deficiency anemia, unspecified: Secondary | ICD-10-CM | POA: Diagnosis not present

## 2023-09-20 DIAGNOSIS — E876 Hypokalemia: Secondary | ICD-10-CM | POA: Diagnosis not present

## 2023-09-20 DIAGNOSIS — N186 End stage renal disease: Secondary | ICD-10-CM | POA: Diagnosis not present

## 2023-09-20 DIAGNOSIS — D631 Anemia in chronic kidney disease: Secondary | ICD-10-CM | POA: Diagnosis not present

## 2023-09-22 DIAGNOSIS — Z992 Dependence on renal dialysis: Secondary | ICD-10-CM | POA: Diagnosis not present

## 2023-09-22 DIAGNOSIS — E1122 Type 2 diabetes mellitus with diabetic chronic kidney disease: Secondary | ICD-10-CM | POA: Diagnosis not present

## 2023-09-22 DIAGNOSIS — N186 End stage renal disease: Secondary | ICD-10-CM | POA: Diagnosis not present

## 2023-09-23 DIAGNOSIS — N186 End stage renal disease: Secondary | ICD-10-CM | POA: Diagnosis not present

## 2023-09-23 DIAGNOSIS — E1129 Type 2 diabetes mellitus with other diabetic kidney complication: Secondary | ICD-10-CM | POA: Diagnosis not present

## 2023-09-23 DIAGNOSIS — D631 Anemia in chronic kidney disease: Secondary | ICD-10-CM | POA: Diagnosis not present

## 2023-09-23 DIAGNOSIS — R197 Diarrhea, unspecified: Secondary | ICD-10-CM | POA: Diagnosis not present

## 2023-09-23 DIAGNOSIS — Z992 Dependence on renal dialysis: Secondary | ICD-10-CM | POA: Diagnosis not present

## 2023-09-23 DIAGNOSIS — D509 Iron deficiency anemia, unspecified: Secondary | ICD-10-CM | POA: Diagnosis not present

## 2023-09-23 DIAGNOSIS — N2581 Secondary hyperparathyroidism of renal origin: Secondary | ICD-10-CM | POA: Diagnosis not present

## 2023-09-23 DIAGNOSIS — E876 Hypokalemia: Secondary | ICD-10-CM | POA: Diagnosis not present

## 2023-09-23 DIAGNOSIS — R52 Pain, unspecified: Secondary | ICD-10-CM | POA: Diagnosis not present

## 2023-09-24 ENCOUNTER — Telehealth (HOSPITAL_COMMUNITY): Payer: Self-pay | Admitting: Cardiology

## 2023-09-24 NOTE — Telephone Encounter (Signed)
 Front office left pt a voice message to schedule a 3 mo f/u appt in the [facility] with Dr. [name]. Received referral from [facility] (Dr. Verdia office) - - pt is an established pt of Dr. [name] and needs a f/u appt.

## 2023-09-25 DIAGNOSIS — Z992 Dependence on renal dialysis: Secondary | ICD-10-CM | POA: Diagnosis not present

## 2023-09-25 DIAGNOSIS — N186 End stage renal disease: Secondary | ICD-10-CM | POA: Diagnosis not present

## 2023-09-25 DIAGNOSIS — N2581 Secondary hyperparathyroidism of renal origin: Secondary | ICD-10-CM | POA: Diagnosis not present

## 2023-09-25 DIAGNOSIS — D631 Anemia in chronic kidney disease: Secondary | ICD-10-CM | POA: Diagnosis not present

## 2023-09-25 DIAGNOSIS — E876 Hypokalemia: Secondary | ICD-10-CM | POA: Diagnosis not present

## 2023-09-25 DIAGNOSIS — E1129 Type 2 diabetes mellitus with other diabetic kidney complication: Secondary | ICD-10-CM | POA: Diagnosis not present

## 2023-09-25 DIAGNOSIS — R52 Pain, unspecified: Secondary | ICD-10-CM | POA: Diagnosis not present

## 2023-09-25 DIAGNOSIS — D509 Iron deficiency anemia, unspecified: Secondary | ICD-10-CM | POA: Diagnosis not present

## 2023-09-25 DIAGNOSIS — R197 Diarrhea, unspecified: Secondary | ICD-10-CM | POA: Diagnosis not present

## 2023-09-27 DIAGNOSIS — R197 Diarrhea, unspecified: Secondary | ICD-10-CM | POA: Diagnosis not present

## 2023-09-27 DIAGNOSIS — N2581 Secondary hyperparathyroidism of renal origin: Secondary | ICD-10-CM | POA: Diagnosis not present

## 2023-09-27 DIAGNOSIS — D631 Anemia in chronic kidney disease: Secondary | ICD-10-CM | POA: Diagnosis not present

## 2023-09-27 DIAGNOSIS — E876 Hypokalemia: Secondary | ICD-10-CM | POA: Diagnosis not present

## 2023-09-27 DIAGNOSIS — Z992 Dependence on renal dialysis: Secondary | ICD-10-CM | POA: Diagnosis not present

## 2023-09-27 DIAGNOSIS — D509 Iron deficiency anemia, unspecified: Secondary | ICD-10-CM | POA: Diagnosis not present

## 2023-09-27 DIAGNOSIS — N186 End stage renal disease: Secondary | ICD-10-CM | POA: Diagnosis not present

## 2023-09-27 DIAGNOSIS — E1129 Type 2 diabetes mellitus with other diabetic kidney complication: Secondary | ICD-10-CM | POA: Diagnosis not present

## 2023-09-27 DIAGNOSIS — R52 Pain, unspecified: Secondary | ICD-10-CM | POA: Diagnosis not present

## 2023-09-30 DIAGNOSIS — D509 Iron deficiency anemia, unspecified: Secondary | ICD-10-CM | POA: Diagnosis not present

## 2023-09-30 DIAGNOSIS — R197 Diarrhea, unspecified: Secondary | ICD-10-CM | POA: Diagnosis not present

## 2023-09-30 DIAGNOSIS — D631 Anemia in chronic kidney disease: Secondary | ICD-10-CM | POA: Diagnosis not present

## 2023-09-30 DIAGNOSIS — N186 End stage renal disease: Secondary | ICD-10-CM | POA: Diagnosis not present

## 2023-09-30 DIAGNOSIS — E1129 Type 2 diabetes mellitus with other diabetic kidney complication: Secondary | ICD-10-CM | POA: Diagnosis not present

## 2023-09-30 DIAGNOSIS — N2581 Secondary hyperparathyroidism of renal origin: Secondary | ICD-10-CM | POA: Diagnosis not present

## 2023-09-30 DIAGNOSIS — R52 Pain, unspecified: Secondary | ICD-10-CM | POA: Diagnosis not present

## 2023-09-30 DIAGNOSIS — Z992 Dependence on renal dialysis: Secondary | ICD-10-CM | POA: Diagnosis not present

## 2023-09-30 DIAGNOSIS — E876 Hypokalemia: Secondary | ICD-10-CM | POA: Diagnosis not present

## 2023-10-02 DIAGNOSIS — D509 Iron deficiency anemia, unspecified: Secondary | ICD-10-CM | POA: Diagnosis not present

## 2023-10-02 DIAGNOSIS — E1129 Type 2 diabetes mellitus with other diabetic kidney complication: Secondary | ICD-10-CM | POA: Diagnosis not present

## 2023-10-02 DIAGNOSIS — N2581 Secondary hyperparathyroidism of renal origin: Secondary | ICD-10-CM | POA: Diagnosis not present

## 2023-10-02 DIAGNOSIS — D631 Anemia in chronic kidney disease: Secondary | ICD-10-CM | POA: Diagnosis not present

## 2023-10-02 DIAGNOSIS — R197 Diarrhea, unspecified: Secondary | ICD-10-CM | POA: Diagnosis not present

## 2023-10-02 DIAGNOSIS — N186 End stage renal disease: Secondary | ICD-10-CM | POA: Diagnosis not present

## 2023-10-02 DIAGNOSIS — Z992 Dependence on renal dialysis: Secondary | ICD-10-CM | POA: Diagnosis not present

## 2023-10-02 DIAGNOSIS — R52 Pain, unspecified: Secondary | ICD-10-CM | POA: Diagnosis not present

## 2023-10-02 DIAGNOSIS — E876 Hypokalemia: Secondary | ICD-10-CM | POA: Diagnosis not present

## 2023-10-02 NOTE — Progress Notes (Shared)
 [facility] - Clinic Note  10/15/2023     CHIEF COMPLAINT Patient presents for No chief complaint on file.  HISTORY OF PRESENT ILLNESS: Jonathan Mcneil is a 68 y.o. male who presents to the clinic today for:    Pt states    Referring physician: Verdia Lombard, MD [address] [place],  [place] [address]  HISTORICAL INFORMATION:   Selected notes from the MEDICAL RECORD NUMBER Referred by Dr. Geroge Daniels for heme OD   CURRENT MEDICATIONS: Current Outpatient Medications (Ophthalmic Drugs)  Medication Sig   dorzolamide -timolol  (COSOPT ) 2-0.5 % ophthalmic solution INSTILL 1 DROP INTO BOTH EYES TWICE A DAY   No current facility-administered medications for this visit. (Ophthalmic Drugs)   Current Outpatient Medications (Other)  Medication Sig   acetaminophen  (TYLENOL ) 500 MG tablet Take 1,000 mg by mouth every 6 (six) hours as needed.   ENTRESTO 24-26 MG Take 1 tablet by mouth 2 (two) times daily.   esomeprazole (NEXIUM) 40 MG capsule Take 40 mg by mouth daily.   gabapentin  (NEURONTIN ) 300 MG capsule Take 300 mg by mouth at bedtime.   loperamide  (IMODIUM ) 2 MG capsule Take 1 capsule (2 mg total) by mouth 4 (four) times daily as needed for diarrhea or loose stools.   methocarbamol  (ROBAXIN ) 500 MG tablet Take 1 tablet (500 mg total) by mouth every 8 (eight) hours as needed for muscle spasms.   metoprolol  succinate (TOPROL -XL) 50 MG 24 hr tablet Take 1 tablet (50 mg total) by mouth daily.   midodrine (PROAMATINE) 5 MG tablet Take 5 mg by mouth See admin instructions.   ondansetron  (ZOFRAN -ODT) 4 MG disintegrating tablet 4mg  ODT q4 hours prn nausea/vomit   sertraline  (ZOLOFT ) 25 MG tablet Take 25 mg by mouth 3 (three) times a week. M W F   sucroferric oxyhydroxide (VELPHORO ) 500 MG chewable tablet Chew 500 mg by mouth 3 (three) times daily with meals.   traMADol  (ULTRAM ) 50 MG tablet Take 25 mg by mouth as needed. 3 x weekly after dialysis as  needed for pain   No current facility-administered medications for this visit. (Other)   REVIEW OF SYSTEMS:    ALLERGIES Allergies  Allergen Reactions   Gabapentin  Nausea Only   Ibuprofen Hives   Penicillins Itching and Other (See Comments)    Has patient had a PCN reaction causing immediate rash, facial/tongue/throat swelling, SOB or lightheadedness with hypotension: No Has patient had a PCN reaction causing severe rash involving mucus membranes or skin necrosis: No Has patient had a PCN reaction that required hospitalization No Has patient had a PCN reaction occurring within the last 10 years: Unknown If all of the above answers are NO, then may proceed with Cephalosporin use.   Venlafaxine Other (See Comments)    insomnia   PAST MEDICAL HISTORY Past Medical History:  Diagnosis Date   Cataract    Diabetes mellitus without complication (HCC)    Diabetic retinopathy (HCC)    ESRD (end stage renal disease) (HCC)    GERD (gastroesophageal reflux disease)    PMH   Hypertension    Hypertensive retinopathy    Low back pain    Lung disease    Metabolic acidosis    Neuromuscular disorder (HCC)    peripheral neuropathy   Pancreatitis    Schizophrenia (HCC)    does not take medications   Past Surgical History:  Procedure Laterality Date   A/V FISTULAGRAM Left 06/03/2018   Procedure: A/V FISTULAGRAM;  Surgeon:  Gretta Lonni PARAS, MD;  Location: [facility] INVASIVE CV LAB;  Service: Cardiovascular;  Laterality: Left;   AV FISTULA PLACEMENT Left 02/11/2018   Procedure: INSERTION OF ARTERIOVENOUS (AV) FISTULA LEFT  ARM;  Surgeon: Sheree Penne Lonni, MD;  Location: [facility] OR;  Service: Vascular;  Laterality: Left;   BASCILIC VEIN TRANSPOSITION Left 04/17/2018   Procedure: BASILIC VEIN TRANSPOSITION SECOND STAGE;  Surgeon: Sheree Penne Lonni, MD;  Location: [facility] OR;  Service: Vascular;  Laterality: Left;   BIOPSY  10/20/2019   Procedure: BIOPSY;  Surgeon: Rosalie Kitchens, MD;   Location: [facility] ENDOSCOPY;  Service: Endoscopy;;   COLONOSCOPY     DENTAL SURGERY     ESOPHAGOGASTRODUODENOSCOPY N/A 06/16/2021   Procedure: ESOPHAGOGASTRODUODENOSCOPY (EGD);  Surgeon: Dianna Specking, MD;  Location: [facility] ENDOSCOPY;  Service: Gastroenterology;  Laterality: N/A;   ESOPHAGOGASTRODUODENOSCOPY (EGD) WITH PROPOFOL  N/A 10/20/2019   Procedure: ESOPHAGOGASTRODUODENOSCOPY (EGD) WITH PROPOFOL ;  Surgeon: Rosalie Kitchens, MD;  Location: [facility] ENDOSCOPY;  Service: Endoscopy;  Laterality: N/A;   INSERTION OF DIALYSIS CATHETER Right 02/25/2018   Procedure: INSERTION OF DIALYSIS CATHETER;  Surgeon: Sheree Penne Lonni, MD;  Location: [facility] OR;  Service: Vascular;  Laterality: Right;   PERIPHERAL VASCULAR BALLOON ANGIOPLASTY  06/03/2018   Procedure: PERIPHERAL VASCULAR BALLOON ANGIOPLASTY;  Surgeon: Gretta Lonni PARAS, MD;  Location: [facility] INVASIVE CV LAB;  Service: Cardiovascular;;  left a/v fistula   FAMILY HISTORY Family History  Problem Relation Age of Onset   Kidney failure Mother    Diabetes Father    Blindness Brother    SOCIAL HISTORY Social History   Tobacco Use   Smoking status: Former   Smokeless tobacco: Never  Advertising account planner   Vaping status: Never Used  Substance Use Topics   Alcohol  use: No   Drug use: Not Currently    Types: Cocaine, Marijuana    Comment: smoked marijuana a few days ago; he denies using cocaine       OPHTHALMIC EXAM:  Not recorded    IMAGING AND PROCEDURES  Imaging and Procedures for 10/15/2023          ASSESSMENT/PLAN: No diagnosis found.  1-3. Proliferative diabetic retinopathy w/ recurrent VH OU (OD > OS)  - A1c: 5.9 on 03.13.24 - s/p IVA OD #1 (12.08.21), #2 (01.11.22), #3 (02.09.22), #4 (11.04.22), #5 (12.02.22), #6 (12.30.22), #7 (01.27.23), #8 (01.05.24), #9 (02.02.24), #10 (08.28.24), #11 (10.09.24), #12 (11.20.24), #13 (04.02.25) - s/p IVA OS #1 (12.10.21), #2 (01.11.22), #3 (02.09.22), #4 (09.09.22), #5 (10.07.22), #6 (11.04.22), #7  (12.02.22), #8 (12.30.22) - FA (12.08.21) shows late-leaking MA, vascular nonperfusion, +NV OU (nasal midzone) - s/p PRP OD (01.26.22) - s/p PRP OS (03.09.22) - repeat FA 6.6.22 shows interval improvement in NVE/leakage OU s/p PRP OU-- just minimal leakage remains - repeat FA 10.09.24 shows OD: NV stably regressed, mild leaking MA; OS: Interval improvement in focal NVE IN periphery -- no leakage; mild perifoveal leakage -- both improved from prior - BCVA OD stable at 20/25, OS stable at 20/20  - OCT shows OD: trace persistent vitreous opacities, cystic changes temporal macula -- improved, mild ERM; OS: ERM with blunted foveal contour, trace cystic changes temporal macula -- improved at 19 weeks  - recommend IVA OD #14 today, 07.23.25 -- maintenance with follow up back to 16 weeks   - pt wishes to proceed with injection  - RBA of procedure discussed, questions answered - IVA informed consent obtained and signed, 01.05.24 (OD) - see procedure note  - f/u 16 weeks, DFE, OCT, possible injections  4. Hemorrhagic PVD OS  - Onset mid-August 2022 w/ new onset floaters, no photopsias  - s/p IVA OS #4 (09.09.22), #5 (10.07.22), #6 (11.04.22), #7 (12.02.22), #8 (12.30.22) - today, VA stably improved to 20/20 OS from 20/40 and 20/80 prior - pt states they have been holding heparin  at dialysis - exam shows interval improvement in vitreous heme -- clearing and settling inferiorly - pt reports previously receiving heparin  during dialysis (T, Th, Sat) -- may have contributed to interval worsening   - Discussed findings and prognosis  - No RT or RD on 360 peripheral exam  - Reviewed s/s of RT/RD  - Strict return precautions for any such RT/RD signs/symptoms  - VH precautions reviewed -- minimize activities, keep head elevated, hold heparin  if able, avoid ASA/NSAIDS  5,6. Hypertensive retinopathy OU - discussed importance of tight BP control - monitor  7. Epiretinal membrane, left eye  - mild ERM -  BCVA 20/20 - asymptomatic, no metamorphopsia - no indication for surgery at this time - monitor for now  8. Mixed Cataract OS - The symptoms of cataract, surgical options, and treatments and risks were discussed with patient - discussed diagnosis and progression - left eye surgery scheduled for Friday, November 22  9. Pseudophakia OD  - s/p CE/IOL (Dr. Octavia)  - IOL in good position, doing well  - monitor  10. Ocular Hypertension OU  - IOP today: 15,14  - cont cosopt  bid OU    Ophthalmic Meds Ordered this visit:  No orders of the defined types were placed in this encounter.    No follow-ups on file.  There are no Patient Instructions on file for this visit.  This document serves as a record of services personally performed by Redell JUDITHANN Hans, MD, PhD. It was created on their behalf by Almetta Pesa, an ophthalmic technician. The creation of this record is the provider's dictation and/or activities during the visit.    Electronically signed by: Almetta Pesa, OA, 10/02/23  2:45 PM    Redell JUDITHANN Hans, M.D., Ph.D. Diseases & Surgery of the Retina and Vitreous [facility]   Abbreviations: M myopia (nearsighted); A astigmatism; H hyperopia (farsighted); P presbyopia; Mrx spectacle prescription;  CTL contact lenses; OD right eye; OS left eye; OU both eyes  XT exotropia; ET esotropia; PEK punctate epithelial keratitis; PEE punctate epithelial erosions; DES dry eye syndrome; MGD meibomian gland dysfunction; ATs artificial tears; PFAT's preservative free artificial tears; NSC nuclear sclerotic cataract; PSC posterior subcapsular cataract; ERM epi-retinal membrane; PVD posterior vitreous detachment; RD retinal detachment; DM diabetes mellitus; DR diabetic retinopathy; NPDR non-proliferative diabetic retinopathy; PDR proliferative diabetic retinopathy; CSME clinically significant macular edema; DME diabetic macular edema; dbh dot blot hemorrhages; CWS cotton  wool spot; POAG primary open angle glaucoma; C/D cup-to-disc ratio; HVF humphrey visual field; GVF goldmann visual field; OCT optical coherence tomography; IOP intraocular pressure; BRVO Branch retinal vein occlusion; CRVO central retinal vein occlusion; CRAO central retinal artery occlusion; BRAO branch retinal artery occlusion; RT retinal tear; SB scleral buckle; PPV pars plana vitrectomy; VH Vitreous hemorrhage; PRP panretinal laser photocoagulation; IVK intravitreal kenalog; VMT vitreomacular traction; MH Macular hole;  NVD neovascularization of the disc; NVE neovascularization elsewhere; AREDS age related eye disease study; ARMD age related macular degeneration; POAG primary open angle glaucoma; EBMD epithelial/anterior basement membrane dystrophy; ACIOL anterior chamber intraocular lens; IOL intraocular lens; PCIOL posterior chamber intraocular lens; Phaco/IOL phacoemulsification with intraocular lens placement; PRK photorefractive keratectomy; LASIK laser assisted in situ keratomileusis;  HTN hypertension; DM diabetes mellitus; COPD chronic obstructive pulmonary disease

## 2023-10-04 DIAGNOSIS — E1129 Type 2 diabetes mellitus with other diabetic kidney complication: Secondary | ICD-10-CM | POA: Diagnosis not present

## 2023-10-04 DIAGNOSIS — Z992 Dependence on renal dialysis: Secondary | ICD-10-CM | POA: Diagnosis not present

## 2023-10-04 DIAGNOSIS — N2581 Secondary hyperparathyroidism of renal origin: Secondary | ICD-10-CM | POA: Diagnosis not present

## 2023-10-04 DIAGNOSIS — R197 Diarrhea, unspecified: Secondary | ICD-10-CM | POA: Diagnosis not present

## 2023-10-04 DIAGNOSIS — N186 End stage renal disease: Secondary | ICD-10-CM | POA: Diagnosis not present

## 2023-10-04 DIAGNOSIS — E876 Hypokalemia: Secondary | ICD-10-CM | POA: Diagnosis not present

## 2023-10-04 DIAGNOSIS — R52 Pain, unspecified: Secondary | ICD-10-CM | POA: Diagnosis not present

## 2023-10-04 DIAGNOSIS — D509 Iron deficiency anemia, unspecified: Secondary | ICD-10-CM | POA: Diagnosis not present

## 2023-10-04 DIAGNOSIS — D631 Anemia in chronic kidney disease: Secondary | ICD-10-CM | POA: Diagnosis not present

## 2023-10-07 DIAGNOSIS — E1129 Type 2 diabetes mellitus with other diabetic kidney complication: Secondary | ICD-10-CM | POA: Diagnosis not present

## 2023-10-07 DIAGNOSIS — Z992 Dependence on renal dialysis: Secondary | ICD-10-CM | POA: Diagnosis not present

## 2023-10-07 DIAGNOSIS — E876 Hypokalemia: Secondary | ICD-10-CM | POA: Diagnosis not present

## 2023-10-07 DIAGNOSIS — D509 Iron deficiency anemia, unspecified: Secondary | ICD-10-CM | POA: Diagnosis not present

## 2023-10-07 DIAGNOSIS — N186 End stage renal disease: Secondary | ICD-10-CM | POA: Diagnosis not present

## 2023-10-07 DIAGNOSIS — R52 Pain, unspecified: Secondary | ICD-10-CM | POA: Diagnosis not present

## 2023-10-07 DIAGNOSIS — N2581 Secondary hyperparathyroidism of renal origin: Secondary | ICD-10-CM | POA: Diagnosis not present

## 2023-10-07 DIAGNOSIS — R197 Diarrhea, unspecified: Secondary | ICD-10-CM | POA: Diagnosis not present

## 2023-10-07 DIAGNOSIS — D631 Anemia in chronic kidney disease: Secondary | ICD-10-CM | POA: Diagnosis not present

## 2023-10-09 DIAGNOSIS — Z992 Dependence on renal dialysis: Secondary | ICD-10-CM | POA: Diagnosis not present

## 2023-10-09 DIAGNOSIS — R197 Diarrhea, unspecified: Secondary | ICD-10-CM | POA: Diagnosis not present

## 2023-10-09 DIAGNOSIS — N2581 Secondary hyperparathyroidism of renal origin: Secondary | ICD-10-CM | POA: Diagnosis not present

## 2023-10-09 DIAGNOSIS — E876 Hypokalemia: Secondary | ICD-10-CM | POA: Diagnosis not present

## 2023-10-09 DIAGNOSIS — E1129 Type 2 diabetes mellitus with other diabetic kidney complication: Secondary | ICD-10-CM | POA: Diagnosis not present

## 2023-10-09 DIAGNOSIS — R52 Pain, unspecified: Secondary | ICD-10-CM | POA: Diagnosis not present

## 2023-10-09 DIAGNOSIS — N186 End stage renal disease: Secondary | ICD-10-CM | POA: Diagnosis not present

## 2023-10-09 DIAGNOSIS — D631 Anemia in chronic kidney disease: Secondary | ICD-10-CM | POA: Diagnosis not present

## 2023-10-09 DIAGNOSIS — D509 Iron deficiency anemia, unspecified: Secondary | ICD-10-CM | POA: Diagnosis not present

## 2023-10-10 DIAGNOSIS — M150 Primary generalized (osteo)arthritis: Secondary | ICD-10-CM | POA: Diagnosis not present

## 2023-10-10 DIAGNOSIS — I5022 Chronic systolic (congestive) heart failure: Secondary | ICD-10-CM | POA: Diagnosis not present

## 2023-10-10 DIAGNOSIS — I340 Nonrheumatic mitral (valve) insufficiency: Secondary | ICD-10-CM | POA: Diagnosis not present

## 2023-10-10 DIAGNOSIS — N2581 Secondary hyperparathyroidism of renal origin: Secondary | ICD-10-CM | POA: Diagnosis not present

## 2023-10-10 DIAGNOSIS — E1165 Type 2 diabetes mellitus with hyperglycemia: Secondary | ICD-10-CM | POA: Diagnosis not present

## 2023-10-10 DIAGNOSIS — I132 Hypertensive heart and chronic kidney disease with heart failure and with stage 5 chronic kidney disease, or end stage renal disease: Secondary | ICD-10-CM | POA: Diagnosis not present

## 2023-10-10 DIAGNOSIS — Z789 Other specified health status: Secondary | ICD-10-CM | POA: Diagnosis not present

## 2023-10-10 DIAGNOSIS — N186 End stage renal disease: Secondary | ICD-10-CM | POA: Diagnosis not present

## 2023-10-10 DIAGNOSIS — I129 Hypertensive chronic kidney disease with stage 1 through stage 4 chronic kidney disease, or unspecified chronic kidney disease: Secondary | ICD-10-CM | POA: Diagnosis not present

## 2023-10-10 DIAGNOSIS — E782 Mixed hyperlipidemia: Secondary | ICD-10-CM | POA: Diagnosis not present

## 2023-10-10 DIAGNOSIS — N185 Chronic kidney disease, stage 5: Secondary | ICD-10-CM | POA: Diagnosis not present

## 2023-10-10 DIAGNOSIS — Z992 Dependence on renal dialysis: Secondary | ICD-10-CM | POA: Diagnosis not present

## 2023-10-11 DIAGNOSIS — D631 Anemia in chronic kidney disease: Secondary | ICD-10-CM | POA: Diagnosis not present

## 2023-10-11 DIAGNOSIS — R197 Diarrhea, unspecified: Secondary | ICD-10-CM | POA: Diagnosis not present

## 2023-10-11 DIAGNOSIS — Z992 Dependence on renal dialysis: Secondary | ICD-10-CM | POA: Diagnosis not present

## 2023-10-11 DIAGNOSIS — E876 Hypokalemia: Secondary | ICD-10-CM | POA: Diagnosis not present

## 2023-10-11 DIAGNOSIS — D509 Iron deficiency anemia, unspecified: Secondary | ICD-10-CM | POA: Diagnosis not present

## 2023-10-11 DIAGNOSIS — E1129 Type 2 diabetes mellitus with other diabetic kidney complication: Secondary | ICD-10-CM | POA: Diagnosis not present

## 2023-10-11 DIAGNOSIS — R52 Pain, unspecified: Secondary | ICD-10-CM | POA: Diagnosis not present

## 2023-10-11 DIAGNOSIS — N186 End stage renal disease: Secondary | ICD-10-CM | POA: Diagnosis not present

## 2023-10-11 DIAGNOSIS — N2581 Secondary hyperparathyroidism of renal origin: Secondary | ICD-10-CM | POA: Diagnosis not present

## 2023-10-14 DIAGNOSIS — R197 Diarrhea, unspecified: Secondary | ICD-10-CM | POA: Diagnosis not present

## 2023-10-14 DIAGNOSIS — N2581 Secondary hyperparathyroidism of renal origin: Secondary | ICD-10-CM | POA: Diagnosis not present

## 2023-10-14 DIAGNOSIS — Z992 Dependence on renal dialysis: Secondary | ICD-10-CM | POA: Diagnosis not present

## 2023-10-14 DIAGNOSIS — D509 Iron deficiency anemia, unspecified: Secondary | ICD-10-CM | POA: Diagnosis not present

## 2023-10-14 DIAGNOSIS — R52 Pain, unspecified: Secondary | ICD-10-CM | POA: Diagnosis not present

## 2023-10-14 DIAGNOSIS — E1129 Type 2 diabetes mellitus with other diabetic kidney complication: Secondary | ICD-10-CM | POA: Diagnosis not present

## 2023-10-14 DIAGNOSIS — D631 Anemia in chronic kidney disease: Secondary | ICD-10-CM | POA: Diagnosis not present

## 2023-10-14 DIAGNOSIS — N186 End stage renal disease: Secondary | ICD-10-CM | POA: Diagnosis not present

## 2023-10-14 DIAGNOSIS — E876 Hypokalemia: Secondary | ICD-10-CM | POA: Diagnosis not present

## 2023-10-15 ENCOUNTER — Encounter (INDEPENDENT_AMBULATORY_CARE_PROVIDER_SITE_OTHER): Admitting: Ophthalmology

## 2023-10-15 DIAGNOSIS — H43812 Vitreous degeneration, left eye: Secondary | ICD-10-CM

## 2023-10-15 DIAGNOSIS — H4311 Vitreous hemorrhage, right eye: Secondary | ICD-10-CM

## 2023-10-15 DIAGNOSIS — E113513 Type 2 diabetes mellitus with proliferative diabetic retinopathy with macular edema, bilateral: Secondary | ICD-10-CM

## 2023-10-15 DIAGNOSIS — H40053 Ocular hypertension, bilateral: Secondary | ICD-10-CM

## 2023-10-15 DIAGNOSIS — H35372 Puckering of macula, left eye: Secondary | ICD-10-CM

## 2023-10-15 DIAGNOSIS — H4312 Vitreous hemorrhage, left eye: Secondary | ICD-10-CM

## 2023-10-15 DIAGNOSIS — I10 Essential (primary) hypertension: Secondary | ICD-10-CM

## 2023-10-15 DIAGNOSIS — H25812 Combined forms of age-related cataract, left eye: Secondary | ICD-10-CM

## 2023-10-15 DIAGNOSIS — Z961 Presence of intraocular lens: Secondary | ICD-10-CM

## 2023-10-15 DIAGNOSIS — H35033 Hypertensive retinopathy, bilateral: Secondary | ICD-10-CM

## 2023-10-16 DIAGNOSIS — Z992 Dependence on renal dialysis: Secondary | ICD-10-CM | POA: Diagnosis not present

## 2023-10-16 DIAGNOSIS — N186 End stage renal disease: Secondary | ICD-10-CM | POA: Diagnosis not present

## 2023-10-16 DIAGNOSIS — R52 Pain, unspecified: Secondary | ICD-10-CM | POA: Diagnosis not present

## 2023-10-16 DIAGNOSIS — E876 Hypokalemia: Secondary | ICD-10-CM | POA: Diagnosis not present

## 2023-10-16 DIAGNOSIS — D631 Anemia in chronic kidney disease: Secondary | ICD-10-CM | POA: Diagnosis not present

## 2023-10-16 DIAGNOSIS — R197 Diarrhea, unspecified: Secondary | ICD-10-CM | POA: Diagnosis not present

## 2023-10-16 DIAGNOSIS — E1129 Type 2 diabetes mellitus with other diabetic kidney complication: Secondary | ICD-10-CM | POA: Diagnosis not present

## 2023-10-16 DIAGNOSIS — D509 Iron deficiency anemia, unspecified: Secondary | ICD-10-CM | POA: Diagnosis not present

## 2023-10-16 DIAGNOSIS — N2581 Secondary hyperparathyroidism of renal origin: Secondary | ICD-10-CM | POA: Diagnosis not present

## 2023-10-18 DIAGNOSIS — E1129 Type 2 diabetes mellitus with other diabetic kidney complication: Secondary | ICD-10-CM | POA: Diagnosis not present

## 2023-10-18 DIAGNOSIS — N186 End stage renal disease: Secondary | ICD-10-CM | POA: Diagnosis not present

## 2023-10-18 DIAGNOSIS — E876 Hypokalemia: Secondary | ICD-10-CM | POA: Diagnosis not present

## 2023-10-18 DIAGNOSIS — R197 Diarrhea, unspecified: Secondary | ICD-10-CM | POA: Diagnosis not present

## 2023-10-18 DIAGNOSIS — R52 Pain, unspecified: Secondary | ICD-10-CM | POA: Diagnosis not present

## 2023-10-18 DIAGNOSIS — Z992 Dependence on renal dialysis: Secondary | ICD-10-CM | POA: Diagnosis not present

## 2023-10-18 DIAGNOSIS — N2581 Secondary hyperparathyroidism of renal origin: Secondary | ICD-10-CM | POA: Diagnosis not present

## 2023-10-18 DIAGNOSIS — D509 Iron deficiency anemia, unspecified: Secondary | ICD-10-CM | POA: Diagnosis not present

## 2023-10-18 DIAGNOSIS — D631 Anemia in chronic kidney disease: Secondary | ICD-10-CM | POA: Diagnosis not present

## 2023-10-20 NOTE — Progress Notes (Signed)
 [facility] - Clinic Note  10/24/2023     CHIEF COMPLAINT Patient presents for Retina Follow Up  HISTORY OF PRESENT ILLNESS: Jonathan Mcneil is a 68 y.o. male who presents to the clinic today for:   HPI     Retina Follow Up   Patient presents with  Diabetic Retinopathy.  In both eyes.  This started 4 years ago.  Severity is moderate.  Duration of 4 months.  Since onset it is stable.  I, the attending physician,  performed the HPI with the patient and updated documentation appropriately.        Comments   Pt denies changes in vision/FOL/floaters/pain. Pt states his eyes have been itching for the past month. Pt is not using any ats. Pt states he has had cataract sx OU. A1c=7.0 in March of 2025. Pt does not monitor blood glucose.  Pt is on dialysis 3 days per week.      Last edited by Valdemar Rogue, MD on 10/29/2023  1:26 AM.     Pt states his eyes have been itching and are blurry in the morning, he's seeing a few floaters   Referring physician: Verdia Lombard, MD [address] [place],  [place] [address]  HISTORICAL INFORMATION:   Selected notes from the MEDICAL RECORD NUMBER Referred by Dr. Geroge Daniels for heme OD   CURRENT MEDICATIONS: Current Outpatient Medications (Ophthalmic Drugs)  Medication Sig   dorzolamide -timolol  (COSOPT ) 2-0.5 % ophthalmic solution INSTILL 1 DROP INTO BOTH EYES TWICE A DAY   No current facility-administered medications for this visit. (Ophthalmic Drugs)   Current Outpatient Medications (Other)  Medication Sig   acetaminophen  (TYLENOL ) 500 MG tablet Take 1,000 mg by mouth every 6 (six) hours as needed.   ENTRESTO 24-26 MG Take 1 tablet by mouth 2 (two) times daily.   esomeprazole (NEXIUM) 40 MG capsule Take 40 mg by mouth daily.   gabapentin  (NEURONTIN ) 300 MG capsule Take 300 mg by mouth at bedtime.   loperamide  (IMODIUM ) 2 MG capsule Take 1 capsule (2 mg total) by mouth 4 (four) times daily as needed  for diarrhea or loose stools.   methocarbamol  (ROBAXIN ) 500 MG tablet Take 1 tablet (500 mg total) by mouth every 8 (eight) hours as needed for muscle spasms.   metoprolol  succinate (TOPROL -XL) 50 MG 24 hr tablet Take 1 tablet (50 mg total) by mouth daily.   midodrine (PROAMATINE) 5 MG tablet Take 5 mg by mouth See admin instructions.   ondansetron  (ZOFRAN -ODT) 4 MG disintegrating tablet 4mg  ODT q4 hours prn nausea/vomit   sertraline  (ZOLOFT ) 25 MG tablet Take 25 mg by mouth 3 (three) times a week. M W F   sucroferric oxyhydroxide (VELPHORO ) 500 MG chewable tablet Chew 500 mg by mouth 3 (three) times daily with meals.   traMADol  (ULTRAM ) 50 MG tablet Take 25 mg by mouth as needed. 3 x weekly after dialysis as needed for pain   No current facility-administered medications for this visit. (Other)   REVIEW OF SYSTEMS: ROS   Positive for: Gastrointestinal, Neurological, Genitourinary, Endocrine, Eyes Negative for: Constitutional, Skin, Musculoskeletal, HENT, Cardiovascular, Respiratory, Psychiatric, Allergic/Imm, Heme/Lymph Last edited by Elnor Avelina RAMAN, COT on 10/24/2023  1:07 PM.       ALLERGIES Allergies  Allergen Reactions   Gabapentin  Nausea Only   Ibuprofen Hives   Penicillins Itching and Other (See Comments)    Has patient had a PCN reaction causing immediate rash, facial/tongue/throat swelling, SOB or lightheadedness with  hypotension: No Has patient had a PCN reaction causing severe rash involving mucus membranes or skin necrosis: No Has patient had a PCN reaction that required hospitalization No Has patient had a PCN reaction occurring within the last 10 years: Unknown If all of the above answers are NO, then may proceed with Cephalosporin use.   Venlafaxine Other (See Comments)    insomnia   PAST MEDICAL HISTORY Past Medical History:  Diagnosis Date   Diabetes mellitus without complication (HCC)    Diabetic retinopathy (HCC)    ESRD (end stage renal disease) (HCC)     GERD (gastroesophageal reflux disease)    PMH   Hypertension    Hypertensive retinopathy    Low back pain    Lung disease    Metabolic acidosis    Neuromuscular disorder (HCC)    peripheral neuropathy   Pancreatitis    Schizophrenia (HCC)    does not take medications   Past Surgical History:  Procedure Laterality Date   A/V FISTULAGRAM Left 06/03/2018   Procedure: A/V FISTULAGRAM;  Surgeon: Gretta Lonni PARAS, MD;  Location: [facility] INVASIVE CV LAB;  Service: Cardiovascular;  Laterality: Left;   AV FISTULA PLACEMENT Left 02/11/2018   Procedure: INSERTION OF ARTERIOVENOUS (AV) FISTULA LEFT  ARM;  Surgeon: Sheree Penne Lonni, MD;  Location: [facility] OR;  Service: Vascular;  Laterality: Left;   BASCILIC VEIN TRANSPOSITION Left 04/17/2018   Procedure: BASILIC VEIN TRANSPOSITION SECOND STAGE;  Surgeon: Sheree Penne Lonni, MD;  Location: [facility] OR;  Service: Vascular;  Laterality: Left;   BIOPSY  10/20/2019   Procedure: BIOPSY;  Surgeon: Rosalie Kitchens, MD;  Location: [facility] ENDOSCOPY;  Service: Endoscopy;;   CATARACT EXTRACTION     COLONOSCOPY     DENTAL SURGERY     ESOPHAGOGASTRODUODENOSCOPY N/A 06/16/2021   Procedure: ESOPHAGOGASTRODUODENOSCOPY (EGD);  Surgeon: Dianna Specking, MD;  Location: [facility] ENDOSCOPY;  Service: Gastroenterology;  Laterality: N/A;   ESOPHAGOGASTRODUODENOSCOPY (EGD) WITH PROPOFOL  N/A 10/20/2019   Procedure: ESOPHAGOGASTRODUODENOSCOPY (EGD) WITH PROPOFOL ;  Surgeon: Rosalie Kitchens, MD;  Location: [facility] ENDOSCOPY;  Service: Endoscopy;  Laterality: N/A;   INSERTION OF DIALYSIS CATHETER Right 02/25/2018   Procedure: INSERTION OF DIALYSIS CATHETER;  Surgeon: Sheree Penne Lonni, MD;  Location: [facility] OR;  Service: Vascular;  Laterality: Right;   PERIPHERAL VASCULAR BALLOON ANGIOPLASTY  06/03/2018   Procedure: PERIPHERAL VASCULAR BALLOON ANGIOPLASTY;  Surgeon: Gretta Lonni PARAS, MD;  Location: [facility] INVASIVE CV LAB;  Service: Cardiovascular;;  left a/v fistula   FAMILY  HISTORY Family History  Problem Relation Age of Onset   Kidney failure Mother    Diabetes Father    Blindness Brother    SOCIAL HISTORY Social History   Tobacco Use   Smoking status: Former   Smokeless tobacco: Never  Advertising account planner   Vaping status: Never Used  Substance Use Topics   Alcohol  use: No   Drug use: Not Currently    Types: Cocaine, Marijuana    Comment: smoked marijuana a few days ago; he denies using cocaine       OPHTHALMIC EXAM:  Base Eye Exam     Visual Acuity (Snellen - Linear)       Right Left   Dist [place] 25-2 20/20   Dist ph [place] NI          Tonometry (Tonopen, 1:02 PM)       Right Left   Pressure 19 16         Pupils       Pupils Dark Light Shape  React APD   Right PERRL 3 2 Round Brisk None   Left PERRL 3 2 Round Brisk None         Visual Fields       Left Right    Full Full         Extraocular Movement       Right Left    Full, Ortho Full, Ortho         Neuro/Psych     Oriented x3: Yes   Mood/Affect: Normal         Dilation     Both eyes: 1.0% Mydriacyl, 2.5% Phenylephrine  @ 1:03 PM           Slit Lamp and Fundus Exam     Slit Lamp Exam       Right Left   Lids/Lashes Dermatochalasis - upper lid Dermatochalasis - upper lid   Conjunctiva/Sclera Melanosis Melanosis, temporal pinguecula   Cornea Mild arcus, well healed cataract wound arcus   Anterior Chamber deep and clear, no cell deep and clear   Iris Round and dilated, No NVI Round and dilated, No NVI   Lens PC IOL in good position, trace Posterior capsular opacification 3+ Nuclear sclerosis, 3+ Cortical cataract   Anterior Vitreous Vitreous syneresis, old white blood clots settled inferiorly -- improving, scattered vitreous condensations, no heme centrally Vitreous syneresis, old white blood clots settled inferiorly -- minimal, no heme centrally, Posterior vitreous detachment         Fundus Exam       Right Left   Disc Mild Pallor, Sharp rim, focal  PPP/PPA Mild Pallor, Sharp rim   C/D Ratio 0.5 0.5   Macula Flat, Good foveal reflex, RPE mottling and clumping, trace cystic changes temporal macula -- stably improved, rare MA, +ERM Flat, good foveal reflex, ERM, RPE mottling and clumping, rare MA, no edema   Vessels attenuated, mild tortuosity attenuated, Tortuous   Periphery Attached, 360 IRH/DBH - improved, pre-retinal heme settled inferiorly, 360 PRP with room for fill in Attached, scattered IRH/DBH greatest nasally, good 360 PRP changes with room for fill in, No RT/RD           IMAGING AND PROCEDURES  Imaging and Procedures for 10/24/2023  OCT, Retina - OU - Both Eyes       Right Eye Quality was good. Central Foveal Thickness: 285. Progression has improved. Findings include normal foveal contour, no IRF, no SRF, intraretinal hyper-reflective material, epiretinal membrane (Trace persistent vitreous opacities -- slightly improved, stable improvement in cystic changes temporal macula, mild ERM).   Left Eye Quality was good. Central Foveal Thickness: 329. Progression has been stable. Findings include no SRF, abnormal foveal contour, intraretinal hyper-reflective material, epiretinal membrane, intraretinal fluid (ERM with blunted foveal contour, trace cystic changes temporal macula ).   Notes *Images captured and stored on drive  Diagnosis / Impression:  OD: Trace persistent vitreous opacities -- slightly improved, stable improvement in cystic changes temporal macula, mild ERM OS: ERM with blunted foveal contour, trace cystic changes temporal macula   Clinical management:  See below  Abbreviations: NFP - Normal foveal profile. CME - cystoid macular edema. PED - pigment epithelial detachment. IRF - intraretinal fluid. SRF - subretinal fluid. EZ - ellipsoid zone. ERM - epiretinal membrane. ORA - outer retinal atrophy. ORT - outer retinal tubulation. SRHM - subretinal hyper-reflective material. IRHM - intraretinal hyper-reflective  material      Intravitreal Injection, Pharmacologic Agent - OD - Right Eye  Time Out 10/24/2023. 2:17 PM. Confirmed correct patient, procedure, site, and patient consented.   Anesthesia Topical anesthesia was used. Anesthetic medications included Lidocaine  2%, Proparacaine 0.5%.   Procedure Preparation included 5% betadine to ocular surface, eyelid speculum. A supplied (32g) needle was used.   Injection: 1.25 mg Bevacizumab  1.25mg /0.18ml   Route: Intravitreal, Site: Right Eye   NDC: [id number], Lot: [id number]$ , Expiration date: 11/16/2023   Post-op Post injection exam found visual acuity of at least counting fingers. The patient tolerated the procedure well. There were no complications. The patient received written and verbal post procedure care education. Post injection medications were not given.            ASSESSMENT/PLAN:   ICD-10-CM   1. Proliferative diabetic retinopathy of both eyes with macular edema associated with type 2 diabetes mellitus (HCC)  E11.3513 OCT, Retina - OU - Both Eyes    Intravitreal Injection, Pharmacologic Agent - OD - Right Eye    Bevacizumab  (AVASTIN ) SOLN 1.25 mg    2. Vitreous hemorrhage of left eye (HCC)  H43.12     3. Vitreous hemorrhage, right eye (HCC)  H43.11     4. Posterior vitreous detachment of left eye  H43.812     5. Essential hypertension  I10     6. Hypertensive retinopathy of both eyes  H35.033     7. Epiretinal membrane (ERM) of left eye  H35.372     8. Combined forms of age-related cataract of left eye  H25.812     9. Pseudophakia  Z96.1     10. Bilateral ocular hypertension  H40.053      1-3. Proliferative diabetic retinopathy w/ recurrent VH OU (OD > OS)  - A1c: 5.9 on 03.13.24 - s/p IVA OD #1 (12.08.21), #2 (01.11.22), #3 (02.09.22), #4 (11.04.22), #5 (12.02.22), #6 (12.30.22), #7 (01.27.23), #8 (01.05.24), #9 (02.02.24), #10 (08.28.24), #11 (10.09.24), #12 (11.20.24), #13 (04.02.25) - s/p IVA OS #1  (12.10.21), #2 (01.11.22), #3 (02.09.22), #4 (09.09.22), #5 (10.07.22), #6 (11.04.22), #7 (12.02.22), #8 (12.30.22) - FA (12.08.21) shows late-leaking MA, vascular nonperfusion, +NV OU (nasal midzone) - s/p PRP OD (01.26.22) - s/p PRP OS (03.09.22) - repeat FA 6.6.22 shows interval improvement in NVE/leakage OU s/p PRP OU-- just minimal leakage remains - repeat FA 10.09.24 shows OD: NV stably regressed, mild leaking MA; OS: Interval improvement in focal NVE IN periphery -- no leakage; mild perifoveal leakage -- both improved from prior - BCVA OD stable at 20/25, OS stable at 20/20  - OCT shows OD: Trace persistent vitreous opacities -- slightly improved, stable improvement in cystic changes temporal macula, mild ERM; OS: ERM with blunted foveal contour, trace cystic changes temporal macula at 16 weeks  - recommend IVA OD #14 today, 08.01.25 -- maintenance with follow up in 16 weeks   - pt wishes to proceed with injection  - RBA of procedure discussed, questions answered - IVA informed consent obtained and signed, 01.05.24 (OD) - see procedure note  - f/u 16 weeks, DFE, OCT, possible injections  4. Hemorrhagic PVD OS  - Onset mid-August 2022 w/ new onset floaters, no photopsias  - s/p IVA OS #4 (09.09.22), #5 (10.07.22), #6 (11.04.22), #7 (12.02.22), #8 (12.30.22) - today, VA stably improved to 20/20 OS from 20/40 and 20/80 prior - pt states they have been holding heparin  at dialysis - exam shows interval improvement in vitreous heme -- clearing and settling inferiorly - pt reports previously receiving heparin  during dialysis (T, Th, Sat) -- may have contributed  to interval worsening   - Discussed findings and prognosis  - No RT or RD on 360 peripheral exam  - Reviewed s/s of RT/RD  - Strict return precautions for any such RT/RD signs/symptoms  - VH precautions reviewed -- minimize activities, keep head elevated, hold heparin  if able, avoid ASA/NSAIDS  5,6. Hypertensive retinopathy OU -  discussed importance of tight BP control - monitor  7. Epiretinal membrane, left eye  - mild ERM - BCVA 20/20 - asymptomatic, no metamorphopsia - no indication for surgery at this time - monitor for now  8. Mixed Cataract OS - The symptoms of cataract, surgical options, and treatments and risks were discussed with patient - discussed diagnosis and progression - left eye surgery scheduled for Friday, November 22  9. Pseudophakia OD  - s/p CE/IOL (Dr. Octavia)  - IOL in good position, doing well  - monitor  10. Ocular Hypertension OU  - IOP today: 19,16  - cont cosopt  bid OU    Ophthalmic Meds Ordered this visit:  Meds ordered this encounter  Medications   Bevacizumab  (AVASTIN ) SOLN 1.25 mg     Return in about 16 weeks (around 02/13/2024) for f/u PDR OU, DFE, OCT, Possible Injxn.  There are no Patient Instructions on file for this visit.  This document serves as a record of services personally performed by Redell JUDITHANN Hans, MD, PhD. It was created on their behalf by Avelina Pereyra, COA an ophthalmic technician. The creation of this record is the provider's dictation and/or activities during the visit.   Electronically signed by: Avelina GORMAN Pereyra, COT  10/29/23  1:28 AM   This document serves as a record of services personally performed by Redell JUDITHANN Hans, MD, PhD. It was created on their behalf by Alan PARAS. Delores, OA an ophthalmic technician. The creation of this record is the provider's dictation and/or activities during the visit.    Electronically signed by: Alan PARAS. Delores, OA 10/29/23 1:28 AM  Redell JUDITHANN Hans, M.D., Ph.D. Diseases & Surgery of the Retina and Vitreous [facility]  I have reviewed the above documentation for accuracy and completeness, and I agree with the above. Redell JUDITHANN Hans, M.D., Ph.D. 10/29/23 1:30 AM   Abbreviations: M myopia (nearsighted); A astigmatism; H hyperopia (farsighted); P presbyopia; Mrx spectacle prescription;   CTL contact lenses; OD right eye; OS left eye; OU both eyes  XT exotropia; ET esotropia; PEK punctate epithelial keratitis; PEE punctate epithelial erosions; DES dry eye syndrome; MGD meibomian gland dysfunction; ATs artificial tears; PFAT's preservative free artificial tears; NSC nuclear sclerotic cataract; PSC posterior subcapsular cataract; ERM epi-retinal membrane; PVD posterior vitreous detachment; RD retinal detachment; DM diabetes mellitus; DR diabetic retinopathy; NPDR non-proliferative diabetic retinopathy; PDR proliferative diabetic retinopathy; CSME clinically significant macular edema; DME diabetic macular edema; dbh dot blot hemorrhages; CWS cotton wool spot; POAG primary open angle glaucoma; C/D cup-to-disc ratio; HVF humphrey visual field; GVF goldmann visual field; OCT optical coherence tomography; IOP intraocular pressure; BRVO Branch retinal vein occlusion; CRVO central retinal vein occlusion; CRAO central retinal artery occlusion; BRAO branch retinal artery occlusion; RT retinal tear; SB scleral buckle; PPV pars plana vitrectomy; VH Vitreous hemorrhage; PRP panretinal laser photocoagulation; IVK intravitreal kenalog; VMT vitreomacular traction; MH Macular hole;  NVD neovascularization of the disc; NVE neovascularization elsewhere; AREDS age related eye disease study; ARMD age related macular degeneration; POAG primary open angle glaucoma; EBMD epithelial/anterior basement membrane dystrophy; ACIOL anterior chamber intraocular lens; IOL intraocular lens; PCIOL posterior chamber  intraocular lens; Phaco/IOL phacoemulsification with intraocular lens placement; PRK photorefractive keratectomy; LASIK laser assisted in situ keratomileusis; HTN hypertension; DM diabetes mellitus; COPD chronic obstructive pulmonary disease

## 2023-10-21 DIAGNOSIS — D631 Anemia in chronic kidney disease: Secondary | ICD-10-CM | POA: Diagnosis not present

## 2023-10-21 DIAGNOSIS — N186 End stage renal disease: Secondary | ICD-10-CM | POA: Diagnosis not present

## 2023-10-21 DIAGNOSIS — Z992 Dependence on renal dialysis: Secondary | ICD-10-CM | POA: Diagnosis not present

## 2023-10-21 DIAGNOSIS — R197 Diarrhea, unspecified: Secondary | ICD-10-CM | POA: Diagnosis not present

## 2023-10-21 DIAGNOSIS — E876 Hypokalemia: Secondary | ICD-10-CM | POA: Diagnosis not present

## 2023-10-21 DIAGNOSIS — D509 Iron deficiency anemia, unspecified: Secondary | ICD-10-CM | POA: Diagnosis not present

## 2023-10-21 DIAGNOSIS — N2581 Secondary hyperparathyroidism of renal origin: Secondary | ICD-10-CM | POA: Diagnosis not present

## 2023-10-21 DIAGNOSIS — R52 Pain, unspecified: Secondary | ICD-10-CM | POA: Diagnosis not present

## 2023-10-21 DIAGNOSIS — E1129 Type 2 diabetes mellitus with other diabetic kidney complication: Secondary | ICD-10-CM | POA: Diagnosis not present

## 2023-10-23 DIAGNOSIS — E1129 Type 2 diabetes mellitus with other diabetic kidney complication: Secondary | ICD-10-CM | POA: Diagnosis not present

## 2023-10-23 DIAGNOSIS — N186 End stage renal disease: Secondary | ICD-10-CM | POA: Diagnosis not present

## 2023-10-23 DIAGNOSIS — E1122 Type 2 diabetes mellitus with diabetic chronic kidney disease: Secondary | ICD-10-CM | POA: Diagnosis not present

## 2023-10-23 DIAGNOSIS — E876 Hypokalemia: Secondary | ICD-10-CM | POA: Diagnosis not present

## 2023-10-23 DIAGNOSIS — R52 Pain, unspecified: Secondary | ICD-10-CM | POA: Diagnosis not present

## 2023-10-23 DIAGNOSIS — Z992 Dependence on renal dialysis: Secondary | ICD-10-CM | POA: Diagnosis not present

## 2023-10-23 DIAGNOSIS — D631 Anemia in chronic kidney disease: Secondary | ICD-10-CM | POA: Diagnosis not present

## 2023-10-23 DIAGNOSIS — D509 Iron deficiency anemia, unspecified: Secondary | ICD-10-CM | POA: Diagnosis not present

## 2023-10-23 DIAGNOSIS — R197 Diarrhea, unspecified: Secondary | ICD-10-CM | POA: Diagnosis not present

## 2023-10-23 DIAGNOSIS — N2581 Secondary hyperparathyroidism of renal origin: Secondary | ICD-10-CM | POA: Diagnosis not present

## 2023-10-24 ENCOUNTER — Encounter (INDEPENDENT_AMBULATORY_CARE_PROVIDER_SITE_OTHER): Payer: Self-pay | Admitting: Ophthalmology

## 2023-10-24 ENCOUNTER — Ambulatory Visit (INDEPENDENT_AMBULATORY_CARE_PROVIDER_SITE_OTHER): Admitting: Ophthalmology

## 2023-10-24 DIAGNOSIS — H4311 Vitreous hemorrhage, right eye: Secondary | ICD-10-CM

## 2023-10-24 DIAGNOSIS — I10 Essential (primary) hypertension: Secondary | ICD-10-CM

## 2023-10-24 DIAGNOSIS — H25812 Combined forms of age-related cataract, left eye: Secondary | ICD-10-CM | POA: Diagnosis not present

## 2023-10-24 DIAGNOSIS — Z961 Presence of intraocular lens: Secondary | ICD-10-CM

## 2023-10-24 DIAGNOSIS — E113513 Type 2 diabetes mellitus with proliferative diabetic retinopathy with macular edema, bilateral: Secondary | ICD-10-CM | POA: Diagnosis not present

## 2023-10-24 DIAGNOSIS — H35033 Hypertensive retinopathy, bilateral: Secondary | ICD-10-CM

## 2023-10-24 DIAGNOSIS — H4313 Vitreous hemorrhage, bilateral: Secondary | ICD-10-CM

## 2023-10-24 DIAGNOSIS — H4312 Vitreous hemorrhage, left eye: Secondary | ICD-10-CM

## 2023-10-24 DIAGNOSIS — H40053 Ocular hypertension, bilateral: Secondary | ICD-10-CM

## 2023-10-24 DIAGNOSIS — H35372 Puckering of macula, left eye: Secondary | ICD-10-CM

## 2023-10-24 DIAGNOSIS — H43812 Vitreous degeneration, left eye: Secondary | ICD-10-CM

## 2023-10-25 DIAGNOSIS — E1129 Type 2 diabetes mellitus with other diabetic kidney complication: Secondary | ICD-10-CM | POA: Diagnosis not present

## 2023-10-25 DIAGNOSIS — D509 Iron deficiency anemia, unspecified: Secondary | ICD-10-CM | POA: Diagnosis not present

## 2023-10-25 DIAGNOSIS — R52 Pain, unspecified: Secondary | ICD-10-CM | POA: Diagnosis not present

## 2023-10-25 DIAGNOSIS — N186 End stage renal disease: Secondary | ICD-10-CM | POA: Diagnosis not present

## 2023-10-25 DIAGNOSIS — E876 Hypokalemia: Secondary | ICD-10-CM | POA: Diagnosis not present

## 2023-10-25 DIAGNOSIS — R197 Diarrhea, unspecified: Secondary | ICD-10-CM | POA: Diagnosis not present

## 2023-10-25 DIAGNOSIS — D631 Anemia in chronic kidney disease: Secondary | ICD-10-CM | POA: Diagnosis not present

## 2023-10-25 DIAGNOSIS — N2581 Secondary hyperparathyroidism of renal origin: Secondary | ICD-10-CM | POA: Diagnosis not present

## 2023-10-25 DIAGNOSIS — Z992 Dependence on renal dialysis: Secondary | ICD-10-CM | POA: Diagnosis not present

## 2023-10-25 DIAGNOSIS — L299 Pruritus, unspecified: Secondary | ICD-10-CM | POA: Diagnosis not present

## 2023-10-28 ENCOUNTER — Ambulatory Visit (INDEPENDENT_AMBULATORY_CARE_PROVIDER_SITE_OTHER): Admitting: Podiatry

## 2023-10-28 DIAGNOSIS — E1149 Type 2 diabetes mellitus with other diabetic neurological complication: Secondary | ICD-10-CM | POA: Diagnosis not present

## 2023-10-28 DIAGNOSIS — B351 Tinea unguium: Secondary | ICD-10-CM

## 2023-10-28 DIAGNOSIS — M79674 Pain in right toe(s): Secondary | ICD-10-CM

## 2023-10-28 DIAGNOSIS — M79675 Pain in left toe(s): Secondary | ICD-10-CM | POA: Diagnosis not present

## 2023-10-28 NOTE — Progress Notes (Signed)
 Subjective: No chief complaint on file.    68 y.o. returns the office today for painful, elongated, thickened toenails which he cannot trim himself.    He states that the shoes are doing very well and happy with them.   No open lesions.  He is still taking gabapentin .   His BS has been in the 200's.   PCP: Jonathan Lombard, MD   Objective: AAO 3, NAD DP/PT pulses palpable, CRT less than 3 seconds Sensation decreased with Gustabo Speed. Nails hypertrophic, dystrophic, elongated, brittle, discolored 10. There is tenderness overlying the nails 1-5 bilaterally. There is no surrounding erythema or drainage along the nail sites. No open lesions or pre-ulcerative lesions are identified. No pain with calf compression, swelling, warmth, erythema.  Assessment: Patient presents with symptomatic onychomycosis; type 2 diabetes with neuropathy  Plan:  Symptomatic onychomycosis -Nails sharply debrided 10 without complication/bleeding. -Daily foot inspection  Return in about 3 months (around 01/28/2024).  Jonathan Mcneil DPM

## 2023-10-29 ENCOUNTER — Encounter (INDEPENDENT_AMBULATORY_CARE_PROVIDER_SITE_OTHER): Payer: Self-pay | Admitting: Ophthalmology

## 2023-10-29 MED ORDER — BEVACIZUMAB CHEMO INJECTION 1.25MG/0.05ML SYRINGE FOR KALEIDOSCOPE
1.2500 mg | INTRAVITREAL | Status: AC | PRN
  Administered 2023-10-29: 1.25 mg via INTRAVITREAL

## 2023-10-30 DIAGNOSIS — D509 Iron deficiency anemia, unspecified: Secondary | ICD-10-CM | POA: Diagnosis not present

## 2023-10-31 NOTE — Progress Notes (Signed)
 Notes for Selection Committee: RD received notification from Abdominal Organ Transplant team regarding potential Living Donor/ Transplant Candidate.   Per chart: Estimated body mass index is 31.57 kg/m as calculated from the following:   Height as of 10/29/23: 1.727 m (5' 8).   Weight as of 10/29/23: 94.2 kg (207 lb 9.6 oz). Lab Results  Component Value Date   HGBA1C 6.2 (H) 09/15/2023    No nutrition issues identified per chart review (based on updated 2019 Pre-Transplant/Living Donor screening guidelines). Formal pre-transplant nutrition evaluation is not indicated at this time.   RD will follow up per Transplant team protocol.  Elveria Kale, MS, RD, LDN, CNSC Clinical Outpatient Dietitian

## 2023-11-06 DIAGNOSIS — Z992 Dependence on renal dialysis: Secondary | ICD-10-CM | POA: Diagnosis not present

## 2023-11-06 DIAGNOSIS — E876 Hypokalemia: Secondary | ICD-10-CM | POA: Diagnosis not present

## 2023-11-06 DIAGNOSIS — N2581 Secondary hyperparathyroidism of renal origin: Secondary | ICD-10-CM | POA: Diagnosis not present

## 2023-11-06 DIAGNOSIS — R197 Diarrhea, unspecified: Secondary | ICD-10-CM | POA: Diagnosis not present

## 2023-11-06 DIAGNOSIS — E1129 Type 2 diabetes mellitus with other diabetic kidney complication: Secondary | ICD-10-CM | POA: Diagnosis not present

## 2023-11-06 DIAGNOSIS — R52 Pain, unspecified: Secondary | ICD-10-CM | POA: Diagnosis not present

## 2023-11-06 DIAGNOSIS — N186 End stage renal disease: Secondary | ICD-10-CM | POA: Diagnosis not present

## 2023-11-06 DIAGNOSIS — D509 Iron deficiency anemia, unspecified: Secondary | ICD-10-CM | POA: Diagnosis not present

## 2023-11-06 DIAGNOSIS — L299 Pruritus, unspecified: Secondary | ICD-10-CM | POA: Diagnosis not present

## 2023-11-08 DIAGNOSIS — L299 Pruritus, unspecified: Secondary | ICD-10-CM | POA: Diagnosis not present

## 2023-11-08 DIAGNOSIS — D631 Anemia in chronic kidney disease: Secondary | ICD-10-CM | POA: Diagnosis not present

## 2023-11-08 DIAGNOSIS — E876 Hypokalemia: Secondary | ICD-10-CM | POA: Diagnosis not present

## 2023-11-08 DIAGNOSIS — R52 Pain, unspecified: Secondary | ICD-10-CM | POA: Diagnosis not present

## 2023-11-08 DIAGNOSIS — Z992 Dependence on renal dialysis: Secondary | ICD-10-CM | POA: Diagnosis not present

## 2023-11-08 DIAGNOSIS — E1129 Type 2 diabetes mellitus with other diabetic kidney complication: Secondary | ICD-10-CM | POA: Diagnosis not present

## 2023-11-08 DIAGNOSIS — N186 End stage renal disease: Secondary | ICD-10-CM | POA: Diagnosis not present

## 2023-11-08 DIAGNOSIS — N2581 Secondary hyperparathyroidism of renal origin: Secondary | ICD-10-CM | POA: Diagnosis not present

## 2023-11-08 DIAGNOSIS — D509 Iron deficiency anemia, unspecified: Secondary | ICD-10-CM | POA: Diagnosis not present

## 2023-11-08 DIAGNOSIS — R197 Diarrhea, unspecified: Secondary | ICD-10-CM | POA: Diagnosis not present

## 2023-11-12 DIAGNOSIS — Z992 Dependence on renal dialysis: Secondary | ICD-10-CM | POA: Diagnosis not present

## 2023-11-12 DIAGNOSIS — E782 Mixed hyperlipidemia: Secondary | ICD-10-CM | POA: Diagnosis not present

## 2023-11-12 DIAGNOSIS — I129 Hypertensive chronic kidney disease with stage 1 through stage 4 chronic kidney disease, or unspecified chronic kidney disease: Secondary | ICD-10-CM | POA: Diagnosis not present

## 2023-11-12 DIAGNOSIS — N185 Chronic kidney disease, stage 5: Secondary | ICD-10-CM | POA: Diagnosis not present

## 2023-11-12 DIAGNOSIS — N186 End stage renal disease: Secondary | ICD-10-CM | POA: Diagnosis not present

## 2023-11-12 DIAGNOSIS — E1122 Type 2 diabetes mellitus with diabetic chronic kidney disease: Secondary | ICD-10-CM | POA: Diagnosis not present

## 2023-11-18 DIAGNOSIS — N2581 Secondary hyperparathyroidism of renal origin: Secondary | ICD-10-CM | POA: Diagnosis not present

## 2023-11-18 DIAGNOSIS — D631 Anemia in chronic kidney disease: Secondary | ICD-10-CM | POA: Diagnosis not present

## 2023-11-18 DIAGNOSIS — N186 End stage renal disease: Secondary | ICD-10-CM | POA: Diagnosis not present

## 2023-11-18 DIAGNOSIS — E876 Hypokalemia: Secondary | ICD-10-CM | POA: Diagnosis not present

## 2023-11-18 DIAGNOSIS — D509 Iron deficiency anemia, unspecified: Secondary | ICD-10-CM | POA: Diagnosis not present

## 2023-11-18 DIAGNOSIS — L299 Pruritus, unspecified: Secondary | ICD-10-CM | POA: Diagnosis not present

## 2023-11-18 DIAGNOSIS — Z992 Dependence on renal dialysis: Secondary | ICD-10-CM | POA: Diagnosis not present

## 2023-11-18 DIAGNOSIS — E1129 Type 2 diabetes mellitus with other diabetic kidney complication: Secondary | ICD-10-CM | POA: Diagnosis not present

## 2023-11-18 DIAGNOSIS — R52 Pain, unspecified: Secondary | ICD-10-CM | POA: Diagnosis not present

## 2023-11-20 DIAGNOSIS — N2581 Secondary hyperparathyroidism of renal origin: Secondary | ICD-10-CM | POA: Diagnosis not present

## 2023-11-20 DIAGNOSIS — L299 Pruritus, unspecified: Secondary | ICD-10-CM | POA: Diagnosis not present

## 2023-11-20 DIAGNOSIS — N186 End stage renal disease: Secondary | ICD-10-CM | POA: Diagnosis not present

## 2023-11-20 DIAGNOSIS — E1129 Type 2 diabetes mellitus with other diabetic kidney complication: Secondary | ICD-10-CM | POA: Diagnosis not present

## 2023-11-20 DIAGNOSIS — Z992 Dependence on renal dialysis: Secondary | ICD-10-CM | POA: Diagnosis not present

## 2023-11-20 DIAGNOSIS — D509 Iron deficiency anemia, unspecified: Secondary | ICD-10-CM | POA: Diagnosis not present

## 2023-11-20 DIAGNOSIS — R197 Diarrhea, unspecified: Secondary | ICD-10-CM | POA: Diagnosis not present

## 2023-11-20 DIAGNOSIS — R52 Pain, unspecified: Secondary | ICD-10-CM | POA: Diagnosis not present

## 2023-11-20 DIAGNOSIS — E876 Hypokalemia: Secondary | ICD-10-CM | POA: Diagnosis not present

## 2023-11-20 NOTE — Progress Notes (Signed)
 Patient's clinical reviewed by Dr. Caprice; Reduced EF, dilated RV, MR, TR. NAC cardiac. Electronically signed by: Amber M Reeves-Daniel, DO 11/19/2023 6:20 PM   I spoke to the patient and his wife; he's aware NAC due to cardiac

## 2023-11-20 NOTE — Progress Notes (Signed)
 Kidney Transplant Evaluation Note:  Pt's chart reviewed by Dr. Caprice on 11/20/23. Pt did not meet selection criteria for listing, per most recent policy.  Pt determined to not be a candidate for transplantation at this time, related to cardiac.   Pt informed via letter, that they may be referred to another transplant center by their nephrologist and/or dialysis center.   Letter sent to patient and referring nephrologist and/or dialysis center.

## 2023-11-21 ENCOUNTER — Encounter (HOSPITAL_COMMUNITY): Payer: Self-pay | Admitting: Cardiology

## 2023-11-21 ENCOUNTER — Ambulatory Visit (HOSPITAL_COMMUNITY)
Admission: RE | Admit: 2023-11-21 | Discharge: 2023-11-21 | Disposition: A | Discharge status: Home / Self Care | Source: Ambulatory Visit | Attending: Cardiology | Admitting: Cardiology

## 2023-11-21 VITALS — BP 122/78 | HR 81 | Ht 69.0 in | Wt 207.6 lb

## 2023-11-21 DIAGNOSIS — I5022 Chronic systolic (congestive) heart failure: Secondary | ICD-10-CM | POA: Insufficient documentation

## 2023-11-21 DIAGNOSIS — N186 End stage renal disease: Secondary | ICD-10-CM | POA: Insufficient documentation

## 2023-11-21 DIAGNOSIS — Z79899 Other long term (current) drug therapy: Secondary | ICD-10-CM | POA: Insufficient documentation

## 2023-11-21 DIAGNOSIS — Z992 Dependence on renal dialysis: Secondary | ICD-10-CM | POA: Diagnosis not present

## 2023-11-21 DIAGNOSIS — Z01818 Encounter for other preprocedural examination: Secondary | ICD-10-CM | POA: Diagnosis not present

## 2023-11-21 DIAGNOSIS — I132 Hypertensive heart and chronic kidney disease with heart failure and with stage 5 chronic kidney disease, or end stage renal disease: Secondary | ICD-10-CM | POA: Insufficient documentation

## 2023-11-21 DIAGNOSIS — I5020 Unspecified systolic (congestive) heart failure: Secondary | ICD-10-CM

## 2023-11-21 MED ORDER — SACUBITRIL-VALSARTAN 49-51 MG PO TABS: 1.0000 | ORAL_TABLET | Freq: Two times a day (BID) | ORAL | 11 refills | Status: DC

## 2023-11-21 MED ORDER — METOPROLOL SUCCINATE ER 50 MG PO TB24: 50.0000 mg | ORAL_TABLET | Freq: Every day | ORAL | 3 refills | Status: DC

## 2023-11-21 NOTE — Progress Notes (Signed)
   ADVANCED HEART FAILURE FOLLOW UP CLINIC NOTE  Referring Physician: Verdia Lombard, MD  Primary Care: Verdia Lombard, MD Primary Cardiologist:  HPI: Jonathan Mcneil is a 68 y.o. male who presents for follow up of chronic systolic heart failure.      Patient has a long history of ESRD.  He was worked up at [facility] and underwent a left heart cath that showed normal coronaries, remainder of his workup was negative.  Per report echo was consistent with mild LVH, diminished E prime velocities and low voltage EKG.  He did not undergo further testing, but his Entresto  has had to be reduced over the past few months.  He has been turned down for transplant at both [facility] and [facility] due to his cardiac function.       SUBJECTIVE:  No complaints today, is concerned about his cardiac function.  His ejection fraction had recovered to 50 to 55% on his most recent echocardiogram.  However, he had a echocardiogram through the [facility] system that showed a moderately reduced ejection fraction of 40 to 45% and he was turned down due to cardiac regions.  He reports that he has been tolerating his medical therapy well, has had no blood pressure dropping issues during dialysis.  Does report occasional left-sided chest discomfort, though it is sharp, not associated with exertion, and usually around the time of dialysis access.  PMH, current medications, allergies, social history, and family history reviewed in epic.  PHYSICAL EXAM: Vitals:   11/21/23 1130  BP: 122/78  Pulse: 81  SpO2: 99%   GENERAL: NAD, well appearing PULM:  Normal work of breathing, CTAB CARDIAC:  JVP: flat         Normal rate with regular rhythm. No murmurs, rubs or gallops.  Trace edema. Warm and well perfused extremities. ABDOMEN: Soft, non-tender, non-distended. NEUROLOGIC: Patient is oriented x3 with no focal or lateralizing neurologic deficits.     DATA REVIEW  ECHO: 05/2022: LVEF 30%, mild LVH with LV dilation.   Grade 2 diastolic dysfunction, moderately dilated RV with severe TR.   CATH: [facility] 07/09/2021 - No significant coronary artery disease - Large main branch coronary arteries - Elevated LVEDP       Heart failure review: - Classification: Heart failure with reduced EF - Etiology: Work up ongoing - NYHA Class: II     ASSESSMENT & PLAN:   Heart failure with reduced EF: Nonischemic, most likely etiology is longstanding hypertension.  Echocardiogram through our system improved to 50 to 55%, however recently read at 40 to 45% with [facility].  Will increase Entresto  to 49/51 mg twice daily and continue metoprolol .  If ejection fraction persistently reduced could consider repeat heart catheterization to evaluate.   - Stable NYHA class I symptoms - Repeat echocardiogram reviewed, EF significantly improved in our system, but reduced through [facility] - Increase Entresto  to 49/51 mg twice daily - Continue metoprolol  succinate 50 mg daily -Not having to use midodrine - Given symptoms and near normal ejection fraction is not a heart transplant candidate at this time -If ejection fraction is 50 to 55%, should not be a compelling contraindication to renal transplantation -Repeat echocardiogram after medication maximization   ESRD: T/Th/S HD. - Continue follow up with nephrology   HTN: Well controlled today. - Increase Entresto  as above    Follow up in 3 months  [name], MD Advanced Heart Failure Mechanical Circulatory Support 11/21/23

## 2023-11-21 NOTE — Patient Instructions (Signed)
 INCREASE Entresto  49/51 mg Twice daily  Your provider has ordered a MUGA scan for you. You will be called to have this test arranged once it has been approved by your insurance company.  Your physician recommends that you schedule a follow-up appointment in: 4 months ( December) ** PLEASE CALL THE OFFICE IN OCTOBER TO ARRANGE YOUR FOLLOW UP APPOINTMENT.**  If you have any questions or concerns before your next appointment please send us  a message through [place] or call our office at [phone].    TO LEAVE A MESSAGE FOR THE NURSE SELECT OPTION 2, PLEASE LEAVE A MESSAGE INCLUDING: YOUR NAME DATE OF BIRTH CALL BACK NUMBER REASON FOR CALL**this is important as we prioritize the call backs  YOU WILL RECEIVE A CALL BACK THE SAME DAY AS LONG AS YOU CALL BEFORE 4:00 PM  At the [facility], you and your health needs are our priority. As part of our continuing mission to provide you with exceptional heart care, we have created designated Provider Care Teams. These Care Teams include your primary Cardiologist (physician) and Advanced Practice Providers (APPs- Physician Assistants and Nurse Practitioners) who all work together to provide you with the care you need, when you need it.   You may see any of the following providers on your designated Care Team at your next follow up: Dr Toribio Fuel Dr Ezra Shuck Dr. Ria Commander Dr. [name] Amy Lenetta, NP Caffie Shed, [place] [facility], [place] Beckey Coe, NP [place] Lee, NP Ellouise Class, NP Tinnie Redman, PharmD Jaun Bash, PharmD   Please be sure to bring in all your medications bottles to every appointment.    Thank you for choosing [facility]

## 2023-11-22 DIAGNOSIS — D631 Anemia in chronic kidney disease: Secondary | ICD-10-CM | POA: Diagnosis not present

## 2023-11-22 DIAGNOSIS — R197 Diarrhea, unspecified: Secondary | ICD-10-CM | POA: Diagnosis not present

## 2023-11-22 DIAGNOSIS — Z992 Dependence on renal dialysis: Secondary | ICD-10-CM | POA: Diagnosis not present

## 2023-11-22 DIAGNOSIS — E876 Hypokalemia: Secondary | ICD-10-CM | POA: Diagnosis not present

## 2023-11-22 DIAGNOSIS — R52 Pain, unspecified: Secondary | ICD-10-CM | POA: Diagnosis not present

## 2023-11-22 DIAGNOSIS — E1129 Type 2 diabetes mellitus with other diabetic kidney complication: Secondary | ICD-10-CM | POA: Diagnosis not present

## 2023-11-22 DIAGNOSIS — N186 End stage renal disease: Secondary | ICD-10-CM | POA: Diagnosis not present

## 2023-11-22 DIAGNOSIS — N2581 Secondary hyperparathyroidism of renal origin: Secondary | ICD-10-CM | POA: Diagnosis not present

## 2023-11-22 DIAGNOSIS — L299 Pruritus, unspecified: Secondary | ICD-10-CM | POA: Diagnosis not present

## 2023-11-22 DIAGNOSIS — D509 Iron deficiency anemia, unspecified: Secondary | ICD-10-CM | POA: Diagnosis not present

## 2023-11-23 DIAGNOSIS — Z992 Dependence on renal dialysis: Secondary | ICD-10-CM | POA: Diagnosis not present

## 2023-11-23 DIAGNOSIS — N186 End stage renal disease: Secondary | ICD-10-CM | POA: Diagnosis not present

## 2023-11-23 DIAGNOSIS — E1122 Type 2 diabetes mellitus with diabetic chronic kidney disease: Secondary | ICD-10-CM | POA: Diagnosis not present

## 2023-11-25 DIAGNOSIS — R52 Pain, unspecified: Secondary | ICD-10-CM | POA: Diagnosis not present

## 2023-11-25 DIAGNOSIS — R197 Diarrhea, unspecified: Secondary | ICD-10-CM | POA: Diagnosis not present

## 2023-11-25 DIAGNOSIS — N2581 Secondary hyperparathyroidism of renal origin: Secondary | ICD-10-CM | POA: Diagnosis not present

## 2023-11-25 DIAGNOSIS — E876 Hypokalemia: Secondary | ICD-10-CM | POA: Diagnosis not present

## 2023-11-25 DIAGNOSIS — N186 End stage renal disease: Secondary | ICD-10-CM | POA: Diagnosis not present

## 2023-11-25 DIAGNOSIS — D631 Anemia in chronic kidney disease: Secondary | ICD-10-CM | POA: Diagnosis not present

## 2023-11-25 DIAGNOSIS — Z992 Dependence on renal dialysis: Secondary | ICD-10-CM | POA: Diagnosis not present

## 2023-11-25 DIAGNOSIS — E1129 Type 2 diabetes mellitus with other diabetic kidney complication: Secondary | ICD-10-CM | POA: Diagnosis not present

## 2023-11-27 DIAGNOSIS — I129 Hypertensive chronic kidney disease with stage 1 through stage 4 chronic kidney disease, or unspecified chronic kidney disease: Secondary | ICD-10-CM | POA: Diagnosis not present

## 2023-11-27 DIAGNOSIS — R197 Diarrhea, unspecified: Secondary | ICD-10-CM | POA: Diagnosis not present

## 2023-11-27 DIAGNOSIS — E782 Mixed hyperlipidemia: Secondary | ICD-10-CM | POA: Diagnosis not present

## 2023-11-27 DIAGNOSIS — D631 Anemia in chronic kidney disease: Secondary | ICD-10-CM | POA: Diagnosis not present

## 2023-11-27 DIAGNOSIS — E876 Hypokalemia: Secondary | ICD-10-CM | POA: Diagnosis not present

## 2023-11-27 DIAGNOSIS — E1165 Type 2 diabetes mellitus with hyperglycemia: Secondary | ICD-10-CM | POA: Diagnosis not present

## 2023-11-27 DIAGNOSIS — N186 End stage renal disease: Secondary | ICD-10-CM | POA: Diagnosis not present

## 2023-11-27 DIAGNOSIS — I132 Hypertensive heart and chronic kidney disease with heart failure and with stage 5 chronic kidney disease, or end stage renal disease: Secondary | ICD-10-CM | POA: Diagnosis not present

## 2023-11-27 DIAGNOSIS — N185 Chronic kidney disease, stage 5: Secondary | ICD-10-CM | POA: Diagnosis not present

## 2023-11-27 DIAGNOSIS — I340 Nonrheumatic mitral (valve) insufficiency: Secondary | ICD-10-CM | POA: Diagnosis not present

## 2023-11-27 DIAGNOSIS — N2581 Secondary hyperparathyroidism of renal origin: Secondary | ICD-10-CM | POA: Diagnosis not present

## 2023-11-27 DIAGNOSIS — E1129 Type 2 diabetes mellitus with other diabetic kidney complication: Secondary | ICD-10-CM | POA: Diagnosis not present

## 2023-11-27 DIAGNOSIS — Z992 Dependence on renal dialysis: Secondary | ICD-10-CM | POA: Diagnosis not present

## 2023-11-27 DIAGNOSIS — Z0000 Encounter for general adult medical examination without abnormal findings: Secondary | ICD-10-CM | POA: Diagnosis not present

## 2023-11-27 DIAGNOSIS — Z23 Encounter for immunization: Secondary | ICD-10-CM | POA: Diagnosis not present

## 2023-11-27 DIAGNOSIS — R52 Pain, unspecified: Secondary | ICD-10-CM | POA: Diagnosis not present

## 2023-11-27 DIAGNOSIS — I5022 Chronic systolic (congestive) heart failure: Secondary | ICD-10-CM | POA: Diagnosis not present

## 2023-11-29 DIAGNOSIS — R197 Diarrhea, unspecified: Secondary | ICD-10-CM | POA: Diagnosis not present

## 2023-11-29 DIAGNOSIS — E1129 Type 2 diabetes mellitus with other diabetic kidney complication: Secondary | ICD-10-CM | POA: Diagnosis not present

## 2023-11-29 DIAGNOSIS — E876 Hypokalemia: Secondary | ICD-10-CM | POA: Diagnosis not present

## 2023-11-29 DIAGNOSIS — R52 Pain, unspecified: Secondary | ICD-10-CM | POA: Diagnosis not present

## 2023-11-29 DIAGNOSIS — N186 End stage renal disease: Secondary | ICD-10-CM | POA: Diagnosis not present

## 2023-11-29 DIAGNOSIS — D631 Anemia in chronic kidney disease: Secondary | ICD-10-CM | POA: Diagnosis not present

## 2023-11-29 DIAGNOSIS — Z992 Dependence on renal dialysis: Secondary | ICD-10-CM | POA: Diagnosis not present

## 2023-11-29 DIAGNOSIS — N2581 Secondary hyperparathyroidism of renal origin: Secondary | ICD-10-CM | POA: Diagnosis not present

## 2023-12-02 DIAGNOSIS — D631 Anemia in chronic kidney disease: Secondary | ICD-10-CM | POA: Diagnosis not present

## 2023-12-02 DIAGNOSIS — E1129 Type 2 diabetes mellitus with other diabetic kidney complication: Secondary | ICD-10-CM | POA: Diagnosis not present

## 2023-12-02 DIAGNOSIS — N2581 Secondary hyperparathyroidism of renal origin: Secondary | ICD-10-CM | POA: Diagnosis not present

## 2023-12-02 DIAGNOSIS — N186 End stage renal disease: Secondary | ICD-10-CM | POA: Diagnosis not present

## 2023-12-02 DIAGNOSIS — R52 Pain, unspecified: Secondary | ICD-10-CM | POA: Diagnosis not present

## 2023-12-02 DIAGNOSIS — E876 Hypokalemia: Secondary | ICD-10-CM | POA: Diagnosis not present

## 2023-12-02 DIAGNOSIS — Z992 Dependence on renal dialysis: Secondary | ICD-10-CM | POA: Diagnosis not present

## 2023-12-02 DIAGNOSIS — R197 Diarrhea, unspecified: Secondary | ICD-10-CM | POA: Diagnosis not present

## 2023-12-04 DIAGNOSIS — R52 Pain, unspecified: Secondary | ICD-10-CM | POA: Diagnosis not present

## 2023-12-04 DIAGNOSIS — E1129 Type 2 diabetes mellitus with other diabetic kidney complication: Secondary | ICD-10-CM | POA: Diagnosis not present

## 2023-12-04 DIAGNOSIS — R197 Diarrhea, unspecified: Secondary | ICD-10-CM | POA: Diagnosis not present

## 2023-12-04 DIAGNOSIS — D631 Anemia in chronic kidney disease: Secondary | ICD-10-CM | POA: Diagnosis not present

## 2023-12-04 DIAGNOSIS — Z992 Dependence on renal dialysis: Secondary | ICD-10-CM | POA: Diagnosis not present

## 2023-12-04 DIAGNOSIS — N186 End stage renal disease: Secondary | ICD-10-CM | POA: Diagnosis not present

## 2023-12-04 DIAGNOSIS — N2581 Secondary hyperparathyroidism of renal origin: Secondary | ICD-10-CM | POA: Diagnosis not present

## 2023-12-04 DIAGNOSIS — E876 Hypokalemia: Secondary | ICD-10-CM | POA: Diagnosis not present

## 2023-12-06 DIAGNOSIS — E876 Hypokalemia: Secondary | ICD-10-CM | POA: Diagnosis not present

## 2023-12-06 DIAGNOSIS — R52 Pain, unspecified: Secondary | ICD-10-CM | POA: Diagnosis not present

## 2023-12-06 DIAGNOSIS — R197 Diarrhea, unspecified: Secondary | ICD-10-CM | POA: Diagnosis not present

## 2023-12-06 DIAGNOSIS — N2581 Secondary hyperparathyroidism of renal origin: Secondary | ICD-10-CM | POA: Diagnosis not present

## 2023-12-06 DIAGNOSIS — N186 End stage renal disease: Secondary | ICD-10-CM | POA: Diagnosis not present

## 2023-12-06 DIAGNOSIS — D631 Anemia in chronic kidney disease: Secondary | ICD-10-CM | POA: Diagnosis not present

## 2023-12-06 DIAGNOSIS — Z992 Dependence on renal dialysis: Secondary | ICD-10-CM | POA: Diagnosis not present

## 2023-12-06 DIAGNOSIS — E1129 Type 2 diabetes mellitus with other diabetic kidney complication: Secondary | ICD-10-CM | POA: Diagnosis not present

## 2023-12-09 DIAGNOSIS — R197 Diarrhea, unspecified: Secondary | ICD-10-CM | POA: Diagnosis not present

## 2023-12-09 DIAGNOSIS — N2581 Secondary hyperparathyroidism of renal origin: Secondary | ICD-10-CM | POA: Diagnosis not present

## 2023-12-09 DIAGNOSIS — E876 Hypokalemia: Secondary | ICD-10-CM | POA: Diagnosis not present

## 2023-12-09 DIAGNOSIS — Z992 Dependence on renal dialysis: Secondary | ICD-10-CM | POA: Diagnosis not present

## 2023-12-09 DIAGNOSIS — R52 Pain, unspecified: Secondary | ICD-10-CM | POA: Diagnosis not present

## 2023-12-09 DIAGNOSIS — D631 Anemia in chronic kidney disease: Secondary | ICD-10-CM | POA: Diagnosis not present

## 2023-12-09 DIAGNOSIS — N186 End stage renal disease: Secondary | ICD-10-CM | POA: Diagnosis not present

## 2023-12-09 DIAGNOSIS — E1129 Type 2 diabetes mellitus with other diabetic kidney complication: Secondary | ICD-10-CM | POA: Diagnosis not present

## 2023-12-11 DIAGNOSIS — E1129 Type 2 diabetes mellitus with other diabetic kidney complication: Secondary | ICD-10-CM | POA: Diagnosis not present

## 2023-12-11 DIAGNOSIS — D631 Anemia in chronic kidney disease: Secondary | ICD-10-CM | POA: Diagnosis not present

## 2023-12-11 DIAGNOSIS — N2581 Secondary hyperparathyroidism of renal origin: Secondary | ICD-10-CM | POA: Diagnosis not present

## 2023-12-11 DIAGNOSIS — Z992 Dependence on renal dialysis: Secondary | ICD-10-CM | POA: Diagnosis not present

## 2023-12-11 DIAGNOSIS — R197 Diarrhea, unspecified: Secondary | ICD-10-CM | POA: Diagnosis not present

## 2023-12-11 DIAGNOSIS — R52 Pain, unspecified: Secondary | ICD-10-CM | POA: Diagnosis not present

## 2023-12-11 DIAGNOSIS — E876 Hypokalemia: Secondary | ICD-10-CM | POA: Diagnosis not present

## 2023-12-11 DIAGNOSIS — N186 End stage renal disease: Secondary | ICD-10-CM | POA: Diagnosis not present

## 2023-12-13 DIAGNOSIS — R52 Pain, unspecified: Secondary | ICD-10-CM | POA: Diagnosis not present

## 2023-12-13 DIAGNOSIS — R197 Diarrhea, unspecified: Secondary | ICD-10-CM | POA: Diagnosis not present

## 2023-12-13 DIAGNOSIS — E1129 Type 2 diabetes mellitus with other diabetic kidney complication: Secondary | ICD-10-CM | POA: Diagnosis not present

## 2023-12-13 DIAGNOSIS — Z992 Dependence on renal dialysis: Secondary | ICD-10-CM | POA: Diagnosis not present

## 2023-12-13 DIAGNOSIS — N2581 Secondary hyperparathyroidism of renal origin: Secondary | ICD-10-CM | POA: Diagnosis not present

## 2023-12-13 DIAGNOSIS — N186 End stage renal disease: Secondary | ICD-10-CM | POA: Diagnosis not present

## 2023-12-13 DIAGNOSIS — E876 Hypokalemia: Secondary | ICD-10-CM | POA: Diagnosis not present

## 2023-12-13 DIAGNOSIS — D631 Anemia in chronic kidney disease: Secondary | ICD-10-CM | POA: Diagnosis not present

## 2023-12-16 DIAGNOSIS — D631 Anemia in chronic kidney disease: Secondary | ICD-10-CM | POA: Diagnosis not present

## 2023-12-16 DIAGNOSIS — R197 Diarrhea, unspecified: Secondary | ICD-10-CM | POA: Diagnosis not present

## 2023-12-16 DIAGNOSIS — R52 Pain, unspecified: Secondary | ICD-10-CM | POA: Diagnosis not present

## 2023-12-16 DIAGNOSIS — N186 End stage renal disease: Secondary | ICD-10-CM | POA: Diagnosis not present

## 2023-12-16 DIAGNOSIS — N2581 Secondary hyperparathyroidism of renal origin: Secondary | ICD-10-CM | POA: Diagnosis not present

## 2023-12-16 DIAGNOSIS — E1129 Type 2 diabetes mellitus with other diabetic kidney complication: Secondary | ICD-10-CM | POA: Diagnosis not present

## 2023-12-16 DIAGNOSIS — E876 Hypokalemia: Secondary | ICD-10-CM | POA: Diagnosis not present

## 2023-12-16 DIAGNOSIS — Z992 Dependence on renal dialysis: Secondary | ICD-10-CM | POA: Diagnosis not present

## 2023-12-18 DIAGNOSIS — D631 Anemia in chronic kidney disease: Secondary | ICD-10-CM | POA: Diagnosis not present

## 2023-12-18 DIAGNOSIS — Z992 Dependence on renal dialysis: Secondary | ICD-10-CM | POA: Diagnosis not present

## 2023-12-18 DIAGNOSIS — R52 Pain, unspecified: Secondary | ICD-10-CM | POA: Diagnosis not present

## 2023-12-18 DIAGNOSIS — R197 Diarrhea, unspecified: Secondary | ICD-10-CM | POA: Diagnosis not present

## 2023-12-18 DIAGNOSIS — E1129 Type 2 diabetes mellitus with other diabetic kidney complication: Secondary | ICD-10-CM | POA: Diagnosis not present

## 2023-12-18 DIAGNOSIS — E876 Hypokalemia: Secondary | ICD-10-CM | POA: Diagnosis not present

## 2023-12-18 DIAGNOSIS — N186 End stage renal disease: Secondary | ICD-10-CM | POA: Diagnosis not present

## 2023-12-18 DIAGNOSIS — N2581 Secondary hyperparathyroidism of renal origin: Secondary | ICD-10-CM | POA: Diagnosis not present

## 2023-12-20 DIAGNOSIS — N2581 Secondary hyperparathyroidism of renal origin: Secondary | ICD-10-CM | POA: Diagnosis not present

## 2023-12-20 DIAGNOSIS — R197 Diarrhea, unspecified: Secondary | ICD-10-CM | POA: Diagnosis not present

## 2023-12-20 DIAGNOSIS — N186 End stage renal disease: Secondary | ICD-10-CM | POA: Diagnosis not present

## 2023-12-20 DIAGNOSIS — D631 Anemia in chronic kidney disease: Secondary | ICD-10-CM | POA: Diagnosis not present

## 2023-12-20 DIAGNOSIS — E876 Hypokalemia: Secondary | ICD-10-CM | POA: Diagnosis not present

## 2023-12-20 DIAGNOSIS — Z992 Dependence on renal dialysis: Secondary | ICD-10-CM | POA: Diagnosis not present

## 2023-12-20 DIAGNOSIS — E1129 Type 2 diabetes mellitus with other diabetic kidney complication: Secondary | ICD-10-CM | POA: Diagnosis not present

## 2023-12-20 DIAGNOSIS — R52 Pain, unspecified: Secondary | ICD-10-CM | POA: Diagnosis not present

## 2023-12-23 DIAGNOSIS — E1129 Type 2 diabetes mellitus with other diabetic kidney complication: Secondary | ICD-10-CM | POA: Diagnosis not present

## 2023-12-23 DIAGNOSIS — R197 Diarrhea, unspecified: Secondary | ICD-10-CM | POA: Diagnosis not present

## 2023-12-23 DIAGNOSIS — Z992 Dependence on renal dialysis: Secondary | ICD-10-CM | POA: Diagnosis not present

## 2023-12-23 DIAGNOSIS — E876 Hypokalemia: Secondary | ICD-10-CM | POA: Diagnosis not present

## 2023-12-23 DIAGNOSIS — N2581 Secondary hyperparathyroidism of renal origin: Secondary | ICD-10-CM | POA: Diagnosis not present

## 2023-12-23 DIAGNOSIS — D631 Anemia in chronic kidney disease: Secondary | ICD-10-CM | POA: Diagnosis not present

## 2023-12-23 DIAGNOSIS — E1122 Type 2 diabetes mellitus with diabetic chronic kidney disease: Secondary | ICD-10-CM | POA: Diagnosis not present

## 2023-12-23 DIAGNOSIS — R52 Pain, unspecified: Secondary | ICD-10-CM | POA: Diagnosis not present

## 2023-12-23 DIAGNOSIS — N186 End stage renal disease: Secondary | ICD-10-CM | POA: Diagnosis not present

## 2024-01-09 ENCOUNTER — Telehealth (HOSPITAL_COMMUNITY): Payer: Self-pay | Admitting: Cardiology

## 2024-01-09 NOTE — Telephone Encounter (Signed)
 Called to confirm/remind patient of their appointment at the [facility] on 01/09/24.   Appointment:   [] Confirmed  [x] Left mess   [] No answer/No voice mail  [] VM Full/unable to leave message  [] Phone not in service  Patient reminded to bring all medications and/or complete list.  Confirmed patient has transportation. Gave directions, instructed to utilize valet parking.

## 2024-01-12 ENCOUNTER — Encounter (HOSPITAL_COMMUNITY): Payer: Self-pay | Admitting: Cardiology

## 2024-01-12 ENCOUNTER — Ambulatory Visit (HOSPITAL_COMMUNITY)
Admission: RE | Admit: 2024-01-12 | Discharge: 2024-01-12 | Disposition: A | Discharge status: Home / Self Care | Source: Ambulatory Visit | Attending: Cardiology | Admitting: Cardiology

## 2024-01-12 VITALS — BP 136/90 | HR 95 | Ht 69.0 in | Wt 214.6 lb

## 2024-01-12 DIAGNOSIS — I132 Hypertensive heart and chronic kidney disease with heart failure and with stage 5 chronic kidney disease, or end stage renal disease: Secondary | ICD-10-CM | POA: Insufficient documentation

## 2024-01-12 DIAGNOSIS — N186 End stage renal disease: Secondary | ICD-10-CM | POA: Diagnosis not present

## 2024-01-12 DIAGNOSIS — I5020 Unspecified systolic (congestive) heart failure: Secondary | ICD-10-CM | POA: Diagnosis not present

## 2024-01-12 DIAGNOSIS — I5022 Chronic systolic (congestive) heart failure: Secondary | ICD-10-CM | POA: Insufficient documentation

## 2024-01-12 DIAGNOSIS — Z79899 Other long term (current) drug therapy: Secondary | ICD-10-CM | POA: Insufficient documentation

## 2024-01-12 MED ORDER — SACUBITRIL-VALSARTAN 24-26 MG PO TABS: 1.0000 | ORAL_TABLET | Freq: Two times a day (BID) | ORAL | 11 refills | Status: AC

## 2024-01-12 MED ORDER — METOPROLOL SUCCINATE ER 100 MG PO TB24
100.0000 mg | ORAL_TABLET | Freq: Every day | ORAL | 3 refills | Status: AC
Start: 2024-01-12 — End: ?

## 2024-01-12 NOTE — Patient Instructions (Signed)
 CHANGE Metoprolol  to 100 mg daily.  CHANGE Entresto  to 24/26 mg Twice daily  You have been referred to pulmonary rehab. They will call you to arrange your appointment.  Your physician has requested that you have an echocardiogram. Echocardiography is a painless test that uses sound waves to create images of your heart. It provides your doctor with information about the size and shape of your heart and how well your heart's chambers and valves are working. This procedure takes approximately one hour. There are no restrictions for this procedure. Please do NOT wear cologne, perfume, aftershave, or lotions (deodorant is allowed). Please arrive 15 minutes prior to your appointment time.  Please note: We ask at that you not bring children with you during ultrasound (echo/ vascular) testing. Due to room size and safety concerns, children are not allowed in the ultrasound rooms during exams. Our front office staff cannot provide observation of children in our lobby area while testing is being conducted. An adult accompanying a patient to their appointment will only be allowed in the ultrasound room at the discretion of the ultrasound technician under special circumstances. We apologize for any inconvenience.  Your physician recommends that you schedule a follow-up appointment in: 4 months ( February 2026) ** PLEASE CALL THE OFFICE IN DECEMBER TO ARRANGE YOUR FOLLOW UP APPOINTMENT.**  If you have any questions or concerns before your next appointment please send us  a message through [employer] or call our office at [phone].    TO LEAVE A MESSAGE FOR THE NURSE SELECT OPTION 2, PLEASE LEAVE A MESSAGE INCLUDING: YOUR NAME DATE OF BIRTH CALL BACK NUMBER REASON FOR CALL**this is important as we prioritize the call backs  YOU WILL RECEIVE A CALL BACK THE SAME DAY AS LONG AS YOU CALL BEFORE 4:00 PM  At the [facility], you and your health needs are our priority. As part of our continuing  mission to provide you with exceptional heart care, we have created designated Provider Care Teams. These Care Teams include your primary Cardiologist (physician) and Advanced Practice Providers (APPs- Physician Assistants and Nurse Practitioners) who all work together to provide you with the care you need, when you need it.   You may see any of the following providers on your designated Care Team at your next follow up: Dr Toribio Fuel Dr Ezra Shuck Dr. Ria Commander Dr. [name] Amy Lenetta, NP Caffie Shed, [place] [facility], [place] Beckey Coe, NP [place] Lee, NP Ellouise Class, NP Tinnie Redman, PharmD Jaun Bash, PharmD   Please be sure to bring in all your medications bottles to every appointment.    Thank you for choosing [facility]

## 2024-01-13 ENCOUNTER — Telehealth (HOSPITAL_COMMUNITY): Payer: Self-pay

## 2024-01-13 NOTE — Progress Notes (Signed)
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    Ophthalmic Meds Ordered this visit:  Meds ordered this encounter  Medications   Bevacizumab  (AVASTIN ) SOLN 1.25 mg     Return in about 4 weeks (around 02/11/2024) for f/u, PDR, DFE, OCT, Possible, IVA, OD.  There are no Patient Instructions on file for this visit.  This document serves as a record of services personally performed by Redell JUDITHANN Hans, MD, PhD. It was created on their behalf by Almetta Pesa, an ophthalmic technician. The creation of this record is the provider's dictation and/or activities during the visit.    Electronically signed by: Almetta Pesa, OA, 01/14/24  9:51  PM  This document serves as a record of services personally performed by Redell JUDITHANN Hans, MD, PhD. It was created on their behalf by Wanda GEANNIE Keens, COT an ophthalmic technician. The creation of this record is the provider's dictation and/or activities during the visit.    Electronically signed by:  Wanda GEANNIE Keens, COT  01/14/24 9:51 PM  Redell JUDITHANN Hans, M.D., Ph.D. Diseases & Surgery of the Retina and Vitreous [facility]  I have reviewed the above documentation for accuracy and completeness, and I agree with the above. Redell JUDITHANN Hans, M.D., Ph.D. 01/14/24 9:56 PM   Abbreviations: M myopia (nearsighted); A astigmatism; H hyperopia (farsighted); P presbyopia; Mrx spectacle prescription;  CTL contact lenses; OD right eye; OS left eye; OU both eyes  XT exotropia; ET esotropia; PEK punctate epithelial keratitis; PEE punctate epithelial erosions; DES dry eye syndrome; MGD meibomian gland dysfunction; ATs artificial tears; PFAT's preservative free artificial tears; NSC nuclear sclerotic cataract; PSC posterior subcapsular cataract; ERM epi-retinal membrane; PVD posterior vitreous detachment; RD retinal detachment; DM diabetes mellitus; DR diabetic retinopathy; NPDR non-proliferative diabetic retinopathy; PDR proliferative diabetic retinopathy; CSME clinically significant macular edema; DME diabetic macular edema; dbh dot blot hemorrhages; CWS cotton wool spot; POAG primary open angle glaucoma; C/D cup-to-disc ratio; HVF humphrey visual field; GVF goldmann visual field; OCT optical coherence tomography; IOP intraocular pressure; BRVO Branch retinal vein occlusion; CRVO central retinal vein occlusion; CRAO central retinal artery occlusion; BRAO branch retinal artery occlusion; RT retinal tear; SB scleral buckle; PPV pars plana vitrectomy; VH Vitreous hemorrhage; PRP panretinal laser photocoagulation; IVK intravitreal kenalog; VMT vitreomacular traction;  MH Macular hole;  NVD  neovascularization of the disc; NVE neovascularization elsewhere; AREDS age related eye disease study; ARMD age related macular degeneration; POAG primary open angle glaucoma; EBMD epithelial/anterior basement membrane dystrophy; ACIOL anterior chamber intraocular lens; IOL intraocular lens; PCIOL posterior chamber intraocular lens; Phaco/IOL phacoemulsification with intraocular lens placement; PRK photorefractive keratectomy; LASIK laser assisted in situ keratomileusis; HTN hypertension; DM diabetes mellitus; COPD chronic obstructive pulmonary disease

## 2024-01-13 NOTE — Telephone Encounter (Signed)
 Pt insurance is active and benefits verified through [facility] Medicare Co-pay $15, DED 0/0 met, out of pocket $3,900/$3,900 met, co-insurance 0%. no pre-authorization required, Alex/[employer] 01/13/2024@4 :08, REF# [id number]  No visit limit for pulmonary rehab.

## 2024-01-13 NOTE — Telephone Encounter (Signed)
 Called patient to see if he is interested in the Pulmonary Rehab Program. Received verbal permission from pt to speak with his wife. Patient expressed interest. Explained scheduling process and went over insurance, patient verbalized understanding. Someone from our pulmonary rehab staff will contact pt at a later time.

## 2024-01-14 ENCOUNTER — Ambulatory Visit (INDEPENDENT_AMBULATORY_CARE_PROVIDER_SITE_OTHER): Admitting: Ophthalmology

## 2024-01-14 ENCOUNTER — Emergency Department (HOSPITAL_COMMUNITY)

## 2024-01-14 ENCOUNTER — Encounter (HOSPITAL_COMMUNITY): Payer: Self-pay | Admitting: *Deleted

## 2024-01-14 ENCOUNTER — Encounter (HOSPITAL_COMMUNITY): Payer: Self-pay

## 2024-01-14 ENCOUNTER — Other Ambulatory Visit: Payer: Self-pay

## 2024-01-14 ENCOUNTER — Telehealth (HOSPITAL_COMMUNITY): Payer: Self-pay

## 2024-01-14 ENCOUNTER — Encounter (INDEPENDENT_AMBULATORY_CARE_PROVIDER_SITE_OTHER): Payer: Self-pay | Admitting: Ophthalmology

## 2024-01-14 ENCOUNTER — Emergency Department (HOSPITAL_COMMUNITY)
Admission: EM | Admit: 2024-01-14 | Discharge: 2024-01-14 | Disposition: A | Discharge status: Home / Self Care | Attending: Emergency Medicine | Admitting: Emergency Medicine

## 2024-01-14 DIAGNOSIS — H40053 Ocular hypertension, bilateral: Secondary | ICD-10-CM | POA: Diagnosis not present

## 2024-01-14 DIAGNOSIS — H35033 Hypertensive retinopathy, bilateral: Secondary | ICD-10-CM

## 2024-01-14 DIAGNOSIS — Z79899 Other long term (current) drug therapy: Secondary | ICD-10-CM | POA: Diagnosis not present

## 2024-01-14 DIAGNOSIS — M545 Low back pain, unspecified: Secondary | ICD-10-CM | POA: Insufficient documentation

## 2024-01-14 DIAGNOSIS — Z961 Presence of intraocular lens: Secondary | ICD-10-CM | POA: Diagnosis not present

## 2024-01-14 DIAGNOSIS — Y92410 Unspecified street and highway as the place of occurrence of the external cause: Secondary | ICD-10-CM | POA: Insufficient documentation

## 2024-01-14 DIAGNOSIS — H4312 Vitreous hemorrhage, left eye: Secondary | ICD-10-CM

## 2024-01-14 DIAGNOSIS — K7689 Other specified diseases of liver: Secondary | ICD-10-CM | POA: Diagnosis not present

## 2024-01-14 DIAGNOSIS — E113513 Type 2 diabetes mellitus with proliferative diabetic retinopathy with macular edema, bilateral: Secondary | ICD-10-CM | POA: Diagnosis not present

## 2024-01-14 DIAGNOSIS — R079 Chest pain, unspecified: Secondary | ICD-10-CM | POA: Insufficient documentation

## 2024-01-14 DIAGNOSIS — M25512 Pain in left shoulder: Secondary | ICD-10-CM | POA: Diagnosis not present

## 2024-01-14 DIAGNOSIS — N400 Enlarged prostate without lower urinary tract symptoms: Secondary | ICD-10-CM | POA: Insufficient documentation

## 2024-01-14 DIAGNOSIS — I10 Essential (primary) hypertension: Secondary | ICD-10-CM

## 2024-01-14 DIAGNOSIS — I700 Atherosclerosis of aorta: Secondary | ICD-10-CM | POA: Insufficient documentation

## 2024-01-14 DIAGNOSIS — H43812 Vitreous degeneration, left eye: Secondary | ICD-10-CM

## 2024-01-14 DIAGNOSIS — M160 Bilateral primary osteoarthritis of hip: Secondary | ICD-10-CM | POA: Diagnosis not present

## 2024-01-14 DIAGNOSIS — S3993XA Unspecified injury of pelvis, initial encounter: Secondary | ICD-10-CM | POA: Diagnosis not present

## 2024-01-14 DIAGNOSIS — S0990XA Unspecified injury of head, initial encounter: Secondary | ICD-10-CM | POA: Diagnosis not present

## 2024-01-14 DIAGNOSIS — I6789 Other cerebrovascular disease: Secondary | ICD-10-CM | POA: Diagnosis not present

## 2024-01-14 DIAGNOSIS — Z992 Dependence on renal dialysis: Secondary | ICD-10-CM | POA: Diagnosis not present

## 2024-01-14 DIAGNOSIS — N186 End stage renal disease: Secondary | ICD-10-CM | POA: Diagnosis not present

## 2024-01-14 DIAGNOSIS — H25812 Combined forms of age-related cataract, left eye: Secondary | ICD-10-CM

## 2024-01-14 DIAGNOSIS — I517 Cardiomegaly: Secondary | ICD-10-CM | POA: Diagnosis not present

## 2024-01-14 DIAGNOSIS — I2510 Atherosclerotic heart disease of native coronary artery without angina pectoris: Secondary | ICD-10-CM | POA: Diagnosis not present

## 2024-01-14 DIAGNOSIS — M542 Cervicalgia: Secondary | ICD-10-CM | POA: Diagnosis not present

## 2024-01-14 DIAGNOSIS — H35372 Puckering of macula, left eye: Secondary | ICD-10-CM | POA: Diagnosis not present

## 2024-01-14 DIAGNOSIS — H4311 Vitreous hemorrhage, right eye: Secondary | ICD-10-CM

## 2024-01-14 DIAGNOSIS — S299XXA Unspecified injury of thorax, initial encounter: Secondary | ICD-10-CM | POA: Diagnosis not present

## 2024-01-14 DIAGNOSIS — S4992XA Unspecified injury of left shoulder and upper arm, initial encounter: Secondary | ICD-10-CM | POA: Diagnosis not present

## 2024-01-14 DIAGNOSIS — S199XXA Unspecified injury of neck, initial encounter: Secondary | ICD-10-CM | POA: Diagnosis not present

## 2024-01-14 DIAGNOSIS — H4313 Vitreous hemorrhage, bilateral: Secondary | ICD-10-CM | POA: Diagnosis not present

## 2024-01-14 DIAGNOSIS — S3991XA Unspecified injury of abdomen, initial encounter: Secondary | ICD-10-CM | POA: Diagnosis not present

## 2024-01-14 DIAGNOSIS — I6782 Cerebral ischemia: Secondary | ICD-10-CM | POA: Diagnosis not present

## 2024-01-14 LAB — CBC
HCT: 37.3 % — ABNORMAL LOW (ref 39.0–52.0)
Hemoglobin: 12.2 g/dL — ABNORMAL LOW (ref 13.0–17.0)
MCH: 32.6 pg (ref 26.0–34.0)
MCHC: 32.7 g/dL (ref 30.0–36.0)
MCV: 99.7 fL (ref 80.0–100.0)
Platelets: 167 K/uL (ref 150–400)
RBC: 3.74 MIL/uL — ABNORMAL LOW (ref 4.22–5.81)
RDW: 15.9 % — ABNORMAL HIGH (ref 11.5–15.5)
WBC: 5.8 K/uL (ref 4.0–10.5)
nRBC: 0 % (ref 0.0–0.2)

## 2024-01-14 LAB — ETHANOL: Alcohol, Ethyl (B): <15 mg/dL (ref <15)

## 2024-01-14 LAB — I-STAT CHEM 8, ED
BUN: 45 mg/dL — ABNORMAL HIGH (ref 8–23)
Calcium, Ion: 1.03 mmol/L — ABNORMAL LOW (ref 1.15–1.40)
Chloride: 101 mmol/L (ref 98–111)
Creatinine, Ser: 10.2 mg/dL — ABNORMAL HIGH (ref 0.61–1.24)
Glucose, Bld: 92 mg/dL (ref 70–99)
HCT: 38 % — ABNORMAL LOW (ref 39.0–52.0)
Hemoglobin: 12.9 g/dL — ABNORMAL LOW (ref 13.0–17.0)
Potassium: 4.3 mmol/L (ref 3.5–5.1)
Sodium: 138 mmol/L (ref 135–145)
TCO2: 22 mmol/L (ref 22–32)

## 2024-01-14 LAB — PROTIME-INR
INR: 1.2 (ref 0.8–1.2)
Prothrombin Time: 15.4 s — ABNORMAL HIGH (ref 11.4–15.2)

## 2024-01-14 LAB — I-STAT CG4 LACTIC ACID, ED: Lactic Acid, Venous: 1.4 mmol/L (ref 0.5–1.9)

## 2024-01-14 MED ORDER — BEVACIZUMAB CHEMO INJECTION 1.25MG/0.05ML SYRINGE FOR KALEIDOSCOPE
1.2500 mg | INTRAVITREAL | Status: AC | PRN
  Administered 2024-01-14: 1.25 mg via INTRAVITREAL
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
## 2024-01-14 NOTE — ED Notes (Signed)
 Patient discharged to home with family. Patient given written and oral discharge instructions. Patient verbalized understanding. Patient walked to lobby with RN with steady gait. Patient breathing without difficulty. Patient denies questions, concerns or needs at this time.
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
## 2024-01-14 NOTE — Progress Notes (Signed)
 Received referral from Dr. [name] for this pt to participate in Pulmonary Rehab with the diagnosis of Heart Failure. Clinical review of pt follow up appt on 01/12/24 Cardiology office note. Pt appropriate for scheduling for Pulmonary rehab. Will forward to support staff for scheduling and verification of insurance eligibility/benefits with pt consent.   Ronal Levin Cardiac and Pulmonary Rehab

## 2024-01-14 NOTE — Telephone Encounter (Signed)
 Called patient to see if he was interested in the Pulmonary Rehab Program. Spoke to his wife he stated he was. Patient will come in for orientation on 01/26/2024 and will attend the 1:15pm exercise class.  Mailed package and placed information in MyChart

## 2024-01-14 NOTE — Telephone Encounter (Signed)
  error

## 2024-01-16 NOTE — Progress Notes (Signed)
   ADVANCED HEART FAILURE FOLLOW UP CLINIC NOTE  Referring Physician: Verdia Lombard, MD  Primary Care: Verdia Lombard, MD Primary Cardiologist:  HPI: Jonathan Mcneil is a 68 y.o. male who presents for follow up of chronic systolic heart failure.      Patient has a long history of ESRD.  He was worked up at [facility] and underwent a left heart cath that showed normal coronaries, remainder of his workup was negative.  Per report echo was consistent with mild LVH, diminished E prime velocities and low voltage EKG.  He did not undergo further testing, but his Entresto  has had to be reduced over the past few months.  He has been turned down for transplant at both [facility] and [facility] due to his cardiac function.       SUBJECTIVE:  Still discouraged about his heart function. He had stopped taking the entresto  as he was having some chest discomfort with moving while on the increased dose, after stopping it his symptoms improved. Otherwise has been taking his medications as prescribed. Has not missed iHD sessions. No volume symptoms.   PMH, current medications, allergies, social history, and family history reviewed in epic.  PHYSICAL EXAM: Vitals:   01/12/24 1529  BP: (!) 136/90  Pulse: 95  SpO2: 99%   GENERAL: NAD, well appearing PULM:  Normal work of breathing, CTAB CARDIAC:  JVP: flat         Normal rate with regular rhythm. Systolic murmur.  Trace edema. Warm and well perfused extremities. ABDOMEN: Soft, non-tender, non-distended. NEUROLOGIC: Patient is oriented x3 with no focal or lateralizing neurologic deficits.      DATA REVIEW  ECHO: 05/2022: LVEF 30%, mild LVH with LV dilation.  Grade 2 diastolic dysfunction, moderately dilated RV with severe TR. 05/2023: LVEF 50-55%, low normal function, mild LVH, grade I DD, mildly reduced RV   CATH: LHC 07/09/2021 - No significant coronary artery disease - Large main branch coronary arteries    ASSESSMENT & PLAN:   Heart  failure with reduced EF: Nonischemic, most likely etiology is longstanding hypertension.  Did not tolerate increase in entresto . Unfortunately his cardiac function has been limiting for transplant evaluation, but most recent EF is near normal. - NYHA class I, euvolemic - Increase metoprolol  succinate to 100mg  daily - Restart entresto  24/26mg  BID -Not having to use midodrine - Given symptoms and near normal ejection fraction is not a heart transplant candidate at this time -If ejection fraction is 50 to 55%, should not be a compelling contraindication to renal transplantation -Repeat echocardiogram - Pulmonary rehab referral   ESRD: T/Th/S HD. - Continue follow up with nephrology   HTN: Mildly elevated today. -Restart entresto  as above    Follow up in 4 months  Jonathan Brownie, MD Advanced Heart Failure Mechanical Circulatory Support 01/16/24

## 2024-01-23 ENCOUNTER — Telehealth (HOSPITAL_COMMUNITY): Payer: Self-pay

## 2024-01-23 NOTE — Telephone Encounter (Signed)
 Called to confirm appt. Pt confirmed appt. Instructed pt on proper footwear. Gave directions along with department number.

## 2024-01-26 ENCOUNTER — Encounter (HOSPITAL_COMMUNITY)
Admission: RE | Admit: 2024-01-26 | Discharge: 2024-01-26 | Disposition: A | Discharge status: Home / Self Care | Source: Ambulatory Visit | Attending: Cardiology | Admitting: Cardiology

## 2024-01-26 ENCOUNTER — Encounter (HOSPITAL_COMMUNITY): Payer: Self-pay

## 2024-01-26 DIAGNOSIS — I5020 Unspecified systolic (congestive) heart failure: Secondary | ICD-10-CM | POA: Insufficient documentation

## 2024-01-26 NOTE — Progress Notes (Signed)
 Pulmonary Individual Treatment Plan  Patient Details  Name: Jonathan Mcneil MRN: [id number] Date of Birth: Aug 30, 1955 Referring Provider:   [name] Pulmonary Rehab Walk Test from 01/26/2024 in [facility]  Referring Provider [name]    Initial Encounter Date:  Flowsheet Row Pulmonary Rehab Walk Test from 01/26/2024 in [facility]  Date 01/26/24    Visit Diagnosis: HFrEF (heart failure with reduced ejection fraction) (HCC) - Plan: ECHOCARDIOGRAM COMPLETE, ECHOCARDIOGRAM COMPLETE  Patient's Home Medications on Admission:   Current Outpatient Medications:    acetaminophen  (TYLENOL ) 500 MG tablet, Take 1,000 mg by mouth every 6 (six) hours as needed., Disp: , Rfl:    gabapentin  (NEURONTIN ) 300 MG capsule, Take 300 mg by mouth at bedtime., Disp: , Rfl:    metoprolol  succinate (TOPROL -XL) 100 MG 24 hr tablet, Take 1 tablet (100 mg total) by mouth daily., Disp: 90 tablet, Rfl: 3   dorzolamide -timolol  (COSOPT ) 2-0.5 % ophthalmic solution, INSTILL 1 DROP INTO BOTH EYES TWICE A DAY (Patient not taking: Reported on 01/26/2024), Disp: 30 mL, Rfl: 2   esomeprazole (NEXIUM) 40 MG capsule, Take 40 mg by mouth daily. (Patient not taking: Reported on 01/26/2024), Disp: , Rfl:    loperamide  (IMODIUM ) 2 MG capsule, Take 1 capsule (2 mg total) by mouth 4 (four) times daily as needed for diarrhea or loose stools. (Patient not taking: Reported on 01/26/2024), Disp: 12 capsule, Rfl: 0   methocarbamol  (ROBAXIN ) 500 MG tablet, Take 1 tablet (500 mg total) by mouth every 8 (eight) hours as needed for muscle spasms. (Patient not taking: Reported on 01/26/2024), Disp: 20 tablet, Rfl: 0   sacubitril -valsartan  (ENTRESTO ) 24-26 MG, Take 1 tablet by mouth 2 (two) times daily., Disp: 60 tablet, Rfl: 11   sertraline  (ZOLOFT ) 25 MG tablet, Take 25 mg by mouth 3 (three) times a week. M W F (Patient not  taking: Reported on 01/26/2024), Disp: , Rfl:    sucroferric oxyhydroxide (VELPHORO ) 500 MG chewable tablet, Chew 500 mg by mouth 3 (three) times daily with meals. (Patient not taking: Reported on 01/26/2024), Disp: , Rfl:    traMADol  (ULTRAM ) 50 MG tablet, Take 25 mg by mouth as needed. 3 x weekly after dialysis as needed for pain (Patient not taking: Reported on 01/26/2024), Disp: , Rfl:   Past Medical History: Past Medical History:  Diagnosis Date   Diabetes mellitus without complication (HCC)    Diabetic retinopathy (HCC)    ESRD (end stage renal disease) (HCC)    GERD (gastroesophageal reflux disease)    PMH   Hypertension    Hypertensive retinopathy    Low back pain    Lung disease    Metabolic acidosis    Neuromuscular disorder (HCC)    peripheral neuropathy   Pancreatitis    Schizophrenia (HCC)    does not take medications    Tobacco Use: Social History   Tobacco Use  Smoking Status Former  Smokeless Tobacco Never    Labs: Review Flowsheet  More data exists      Latest Ref Rng & Units 03/18/2021 03/19/2021 04/11/2021 06/15/2021 01/14/2024  Labs for ITP Cardiac and Pulmonary Rehab  Hemoglobin A1c 4.8 - 5.6 % - 5.3  - - -  Bicarbonate 20.0 - 28.0 mmol/L 25.1  - - 25.7  -  TCO2 22 - 32 mmol/L 26  26  - 32  27  22   O2 Saturation %  75.0  - - 98  -    Details       Multiple values from one day are sorted in reverse-chronological order         Capillary Blood Glucose: Lab Results  Component Value Date   GLUCAP 148 (H) 08/13/2023   GLUCAP 102 (H) 10/17/2022   GLUCAP 157 (H) 06/15/2021   GLUCAP 122 (H) 12/07/2020   GLUCAP 99 12/07/2020     Pulmonary Assessment Scores:  Pulmonary Assessment Scores     Row Name 01/26/24 1158         ADL UCSD   ADL Phase Entry     SOB Score total 56       CAT Score   CAT Score 15       mMRC Score   mMRC Score 2       UCSD: Self-administered rating of dyspnea associated with activities of daily living  (ADLs) 6-point scale (0 = not at all to 5 = maximal or unable to do because of breathlessness)  Scoring Scores range from 0 to 120.  Minimally important difference is 5 units  CAT: CAT can identify the health impairment of COPD patients and is better correlated with disease progression.  CAT has a scoring range of zero to 40. The CAT score is classified into four groups of low (less than 10), medium (10 - 20), high (21-30) and very high (31-40) based on the impact level of disease on health status. A CAT score over 10 suggests significant symptoms.  A worsening CAT score could be explained by an exacerbation, poor medication adherence, poor inhaler technique, or progression of COPD or comorbid conditions.  CAT MCID is 2 points  mMRC: mMRC (Modified Medical Research Council) Dyspnea Scale is used to assess the degree of baseline functional disability in patients of respiratory disease due to dyspnea. No minimal important difference is established. A decrease in score of 1 point or greater is considered a positive change.   Pulmonary Function Assessment:  Pulmonary Function Assessment - 01/26/24 1158       Breath   Bilateral Breath Sounds Clear    Shortness of Breath Yes;Limiting activity          Exercise Target Goals: Exercise Program Goal: Individual exercise prescription set using results from initial 6 min walk test and THRR while considering  patient's activity barriers and safety.   Exercise Prescription Goal: Initial exercise prescription builds to 30-45 minutes a day of aerobic activity, 2-3 days per week.  Home exercise guidelines will be given to patient during program as part of exercise prescription that the participant will acknowledge.  Activity Barriers & Risk Stratification:  Activity Barriers & Cardiac Risk Stratification - 01/26/24 1156       Activity Barriers & Cardiac Risk Stratification   Activity Barriers Deconditioning;Muscular Weakness;Shortness of  Breath;Other (comment)    Comments neuropathy B hands          6 Minute Walk:  6 Minute Walk     Row Name 01/26/24 1140         6 Minute Walk   Phase Initial     Distance 1242 feet     Walk Time 6 minutes     # of Rest Breaks 0     MPH 2.35     METS 2.95     RPE 9     Perceived Dyspnea  1     VO2 Peak 10.34     Symptoms No  Resting HR 87 bpm     Resting BP 118/64     Resting Oxygen Saturation  100 %     Exercise Oxygen Saturation  during 6 min walk 91 %     Max Ex. HR 114 bpm     Max Ex. BP 142/70     2 Minute Post BP 132/74       Interval HR   1 Minute HR 97     2 Minute HR 95     3 Minute HR 104     4 Minute HR 114     5 Minute HR 108     6 Minute HR 112     2 Minute Post HR 92     Interval Heart Rate? Yes       Interval Oxygen   Interval Oxygen? Yes     Baseline Oxygen Saturation % 100 %     1 Minute Oxygen Saturation % 99 %     1 Minute Liters of Oxygen 0 L     2 Minute Oxygen Saturation % 98 %     2 Minute Liters of Oxygen 0 L     3 Minute Oxygen Saturation % 94 %     3 Minute Liters of Oxygen 0 L     4 Minute Oxygen Saturation % 91 %     4 Minute Liters of Oxygen 0 L     5 Minute Oxygen Saturation % 96 %     5 Minute Liters of Oxygen 0 L     6 Minute Oxygen Saturation % 93 %     6 Minute Liters of Oxygen 0 L     2 Minute Post Oxygen Saturation % 99 %     2 Minute Post Liters of Oxygen 0 L        Oxygen Initial Assessment:  Oxygen Initial Assessment - 01/26/24 1158       Home Oxygen   Home Oxygen Device None    Sleep Oxygen Prescription None    Home Exercise Oxygen Prescription None    Home Resting Oxygen Prescription None      Initial 6 min Walk   Oxygen Used None      Program Oxygen Prescription   Program Oxygen Prescription None      Intervention   Short Term Goals To learn and understand importance of maintaining oxygen saturations>88%;To learn and demonstrate proper pursed lip breathing techniques or other breathing  techniques. ;To learn and understand importance of monitoring SPO2 with pulse oximeter and demonstrate accurate use of the pulse oximeter.    Long  Term Goals Maintenance of O2 saturations>88%;Exhibits proper breathing techniques, such as pursed lip breathing or other method taught during program session;Verbalizes importance of monitoring SPO2 with pulse oximeter and return demonstration          Oxygen Re-Evaluation:   Oxygen Discharge (Final Oxygen Re-Evaluation):   Initial Exercise Prescription:  Initial Exercise Prescription - 01/26/24 1100       Date of Initial Exercise RX and Referring Provider   Date 01/26/24    Referring Provider [facility]    Expected Discharge Date 05/06/24      Treadmill   MPH 1.6    Grade 0    Minutes 15    METs 1.8      Recumbant Elliptical   Level 2    RPM 70    Watts 80    Minutes 15    METs 2  Prescription Details   Frequency (times per week) 2    Duration Progress to 30 minutes of continuous aerobic without signs/symptoms of physical distress      Intensity   THRR 40-80% of Max Heartrate 61-122    Ratings of Perceived Exertion 11-13    Perceived Dyspnea 0-4      Progression   Progression Continue to progress workloads to maintain intensity without signs/symptoms of physical distress.      Resistance Training   Training Prescription Yes    Weight blue bands    Reps 10-15          Perform Capillary Blood Glucose checks as needed.  Exercise Prescription Changes:   Exercise Comments:   Exercise Goals and Review:   Exercise Goals     Row Name 01/26/24 1157             Exercise Goals   Increase Physical Activity Yes       Intervention Provide advice, education, support and counseling about physical activity/exercise needs.;Develop an individualized exercise prescription for aerobic and resistive training based on initial evaluation findings, risk stratification, comorbidities and participant's personal goals.        Expected Outcomes Short Term: Attend rehab on a regular basis to increase amount of physical activity.;Long Term: Exercising regularly at least 3-5 days a week.;Long Term: Add in home exercise to make exercise part of routine and to increase amount of physical activity.       Increase Strength and Stamina Yes       Intervention Provide advice, education, support and counseling about physical activity/exercise needs.;Develop an individualized exercise prescription for aerobic and resistive training based on initial evaluation findings, risk stratification, comorbidities and participant's personal goals.       Expected Outcomes Short Term: Increase workloads from initial exercise prescription for resistance, speed, and METs.;Short Term: Perform resistance training exercises routinely during rehab and add in resistance training at home;Long Term: Improve cardiorespiratory fitness, muscular endurance and strength as measured by increased METs and functional capacity ( )       Able to understand and use rate of perceived exertion (RPE) scale Yes       Intervention Provide education and explanation on how to use RPE scale       Expected Outcomes Short Term: Able to use RPE daily in rehab to express subjective intensity level;Long Term:  Able to use RPE to guide intensity level when exercising independently       Able to understand and use Dyspnea scale Yes       Intervention Provide education and explanation on how to use Dyspnea scale       Expected Outcomes Long Term: Able to use Dyspnea scale to guide intensity level when exercising independently;Short Term: Able to use Dyspnea scale daily in rehab to express subjective sense of shortness of breath during exertion       Knowledge and understanding of Target Heart Rate Range (THRR) Yes       Intervention Provide education and explanation of THRR including how the numbers were predicted and where they are located for reference       Expected Outcomes  Short Term: Able to state/look up THRR;Short Term: Able to use daily as guideline for intensity in rehab;Long Term: Able to use THRR to govern intensity when exercising independently       Understanding of Exercise Prescription Yes       Intervention Provide education, explanation, and written materials on patient's individual exercise prescription  Expected Outcomes Short Term: Able to explain program exercise prescription;Long Term: Able to explain home exercise prescription to exercise independently          Exercise Goals Re-Evaluation :   Discharge Exercise Prescription (Final Exercise Prescription Changes):   Nutrition:  Target Goals: Understanding of nutrition guidelines, daily intake of sodium 1500mg , cholesterol 200mg , calories 30% from fat and 7% or less from saturated fats, daily to have 5 or more servings of fruits and vegetables.  Biometrics:  Pre Biometrics - 01/26/24 1157       Pre Biometrics   Grip Strength 19 kg           Nutrition Therapy Plan and Nutrition Goals:   Nutrition Assessments:  MEDIFICTS Score Key: >=70 Need to make dietary changes  40-70 Heart Healthy Diet <= 40 Therapeutic Level Cholesterol Diet   Picture Your Plate Scores: <59 Unhealthy dietary pattern with much room for improvement. 41-50 Dietary pattern unlikely to meet recommendations for good health and room for improvement. 51-60 More healthful dietary pattern, with some room for improvement.  >60 Healthy dietary pattern, although there may be some specific behaviors that could be improved.    Nutrition Goals Re-Evaluation:   Nutrition Goals Discharge (Final Nutrition Goals Re-Evaluation):   Psychosocial: Target Goals: Acknowledge presence or absence of significant depression and/or stress, maximize coping skills, provide positive support system. Participant is able to verbalize types and ability to use techniques and skills needed for reducing stress and  depression.  Initial Review & Psychosocial Screening:  Initial Psych Review & Screening - 01/26/24 1203       Initial Review   Current issues with None Identified   Pt scored 6/15 on his PHQ 2/9 screening. Pt declined any additional needs, resources, or referrals.     Family Dynamics   Good Support System? Yes    Comments Great support from wife and family      Barriers   Psychosocial barriers to participate in program There are no identifiable barriers or psychosocial needs.      Screening Interventions   Interventions Encouraged to exercise;Provide feedback about the scores to participant    Expected Outcomes Short Term goal: Utilizing psychosocial counselor, staff and physician to assist with identification of specific Stressors or current issues interfering with healing process. Setting desired goal for each stressor or current issue identified.;Long Term Goal: Stressors or current issues are controlled or eliminated.;Short Term goal: Identification and review with participant of any Quality of Life or Depression concerns found by scoring the questionnaire.;Long Term goal: The participant improves quality of Life and PHQ9 Scores as seen by post scores and/or verbalization of changes          Quality of Life Scores:  Scores of 19 and below usually indicate a poorer quality of life in these areas.  A difference of  2-3 points is a clinically meaningful difference.  A difference of 2-3 points in the total score of the Quality of Life Index has been associated with significant improvement in overall quality of life, self-image, physical symptoms, and general health in studies assessing change in quality of life.  PHQ-9: Review Flowsheet       01/26/2024  Depression screen PHQ 2/9  Decreased Interest 3  Down, Depressed, Hopeless 3  PHQ - 2 Score 6  Altered sleeping 3  Tired, decreased energy 3  Change in appetite 0  Feeling bad or failure about yourself  2  Trouble  concentrating 0  Moving slowly or fidgety/restless  0  Suicidal thoughts 1  PHQ-9 Score 15  Difficult doing work/chores Somewhat difficult   Interpretation of Total Score  Total Score Depression Severity:  1-4 = Minimal depression, 5-9 = Mild depression, 10-14 = Moderate depression, 15-19 = Moderately severe depression, 20-27 = Severe depression   Psychosocial Evaluation and Intervention:  Psychosocial Evaluation - 01/26/24 1208       Psychosocial Evaluation & Interventions   Interventions Encouraged to exercise with the program and follow exercise prescription    Comments Pt denies any psy/soc barriers or concerns. Pt scored 6/15 on his PHQ 2/9 screening. Pt declined any additional needs, resources, or referrals. Per chart review, pt has hx of schizophrenia, currently not on meds    Expected Outcomes For Anthonny to participate in PR free of any psy/soc barriers or concerns    Continue Psychosocial Services  Follow up required by staff          Psychosocial Re-Evaluation:   Psychosocial Discharge (Final Psychosocial Re-Evaluation):   Education: Education Goals: Education classes will be provided on a weekly basis, covering required topics. Participant will state understanding/return demonstration of topics presented.  Learning Barriers/Preferences:  Learning Barriers/Preferences - 01/26/24 1212       Learning Barriers/Preferences   Learning Barriers None    Learning Preferences None          Education Topics: Know Your Numbers Group instruction that is supported by a PowerPoint presentation. Instructor discusses importance of knowing and understanding resting, exercise, and post-exercise oxygen saturation, heart rate, and blood pressure. Oxygen saturation, heart rate, blood pressure, rating of perceived exertion, and dyspnea are reviewed along with a normal range for these values.    Exercise for the Pulmonary Patient Group instruction that is supported by a PowerPoint  presentation. Instructor discusses benefits of exercise, core components of exercise, frequency, duration, and intensity of an exercise routine, importance of utilizing pulse oximetry during exercise, safety while exercising, and options of places to exercise outside of rehab.    MET Level  Group instruction provided by PowerPoint, verbal discussion, and written material to support subject matter. Instructor reviews what METs are and how to increase METs.    Pulmonary Medications Verbally interactive group education provided by instructor with focus on inhaled medications and proper administration.   Anatomy and Physiology of the Respiratory System Group instruction provided by PowerPoint, verbal discussion, and written material to support subject matter. Instructor reviews respiratory cycle and anatomical components of the respiratory system and their functions. Instructor also reviews differences in obstructive and restrictive respiratory diseases with examples of each.    Oxygen Safety Group instruction provided by PowerPoint, verbal discussion, and written material to support subject matter. There is an overview of "What is Oxygen" and "Why do we need it".  Instructor also reviews how to create a safe environment for oxygen use, the importance of using oxygen as prescribed, and the risks of noncompliance. There is a brief discussion on traveling with oxygen and resources the patient may utilize.   Oxygen Use Group instruction provided by PowerPoint, verbal discussion, and written material to discuss how supplemental oxygen is prescribed and different types of oxygen supply systems. Resources for more information are provided.    Breathing Techniques Group instruction that is supported by demonstration and informational handouts. Instructor discusses the benefits of pursed lip and diaphragmatic breathing and detailed demonstration on how to perform both.     Risk Factor Reduction Group  instruction that is supported by a PowerPoint presentation. Instructor discusses  the definition of a risk factor, different risk factors for pulmonary disease, and how the heart and lungs work together.   Pulmonary Diseases Group instruction provided by PowerPoint, verbal discussion, and written material to support subject matter. Instructor gives an overview of the different type of pulmonary diseases. There is also a discussion on risk factors and symptoms as well as ways to manage the diseases.   Stress and Energy Conservation Group instruction provided by PowerPoint, verbal discussion, and written material to support subject matter. Instructor gives an overview of stress and the impact it can have on the body. Instructor also reviews ways to reduce stress. There is also a discussion on energy conservation and ways to conserve energy throughout the day.   Warning Signs and Symptoms Group instruction provided by PowerPoint, verbal discussion, and written material to support subject matter. Instructor reviews warning signs and symptoms of stroke, heart attack, cold and flu. Instructor also reviews ways to prevent the spread of infection.   Other Education Group or individual verbal, written, or video instructions that support the educational goals of the pulmonary rehab program.    Knowledge Questionnaire Score:  Knowledge Questionnaire Score - 01/26/24 1203       Knowledge Questionnaire Score   Pre Score 12/18          Core Components/Risk Factors/Patient Goals at Admission:  Personal Goals and Risk Factors at Admission - 01/26/24 1212       Core Components/Risk Factors/Patient Goals on Admission   Improve shortness of breath with ADL's Yes    Intervention Provide education, individualized exercise plan and daily activity instruction to help decrease symptoms of SOB with activities of daily living.    Expected Outcomes Short Term: Improve cardiorespiratory fitness to achieve a  reduction of symptoms when performing ADLs;Long Term: Be able to perform more ADLs without symptoms or delay the onset of symptoms    Heart Failure Yes    Intervention Provide a combined exercise and nutrition program that is supplemented with education, support and counseling about heart failure. Directed toward relieving symptoms such as shortness of breath, decreased exercise tolerance, and extremity edema.    Expected Outcomes Improve functional capacity of life;Short term: Attendance in program 2-3 days a week with increased exercise capacity. Reported lower sodium intake. Reported increased fruit and vegetable intake. Reports medication compliance.;Short term: Daily weights obtained and reported for increase. Utilizing diuretic protocols set by physician.;Long term: Adoption of self-care skills and reduction of barriers for early signs and symptoms recognition and intervention leading to self-care maintenance.          Core Components/Risk Factors/Patient Goals Review:    Core Components/Risk Factors/Patient Goals at Discharge (Final Review):    ITP Comments:   Comments: Dr. Slater Staff is Medical Director for Pulmonary Rehab at [facility].

## 2024-01-26 NOTE — Progress Notes (Signed)
 Pulmonary Rehab Orientation Note  Patient Details  Name: Jonathan Mcneil MRN: [id number] Date of Birth: 1955-04-18 Referring Provider:   [name] Pulmonary Rehab Walk Test from 01/26/2024 in [facility] for [facility], Vascular, & Lung Health  Referring Provider [name]   This patient who was referred to Pulmonary Rehab by Dr. [name] with the diagnosis of heart failure arrived today in Cardiac and Pulmonary Rehab. He arrived ambulatory with normal gait. He does not carry portable oxygen. Per patient, Jonathan Mcneil uses oxygen never. Color good, skin warm and dry. Patient is oriented to time and place. Patient's medical history, psychosocial health, and medications reviewed. Psychosocial assessment reveals patient lives with spouse. Jonathan Mcneil is currently retired. Patient hobbies include watching tv. Patient reports his stress level is low. Areas of stress/anxiety include health. Patient does possibly exhibit signs of depression. Signs of depression include hopelessness and fatigue. PHQ2/9 score 6/15. Jonathan Mcneil declined any additional needs, resources or referrals about his mental health. Jonathan Mcneil shows good  coping skills with positive outlook on life. Offered emotional support and reassurance. Will continue to monitor and evaluate progress toward psychosocial goal(s) of decreased health related stress. Physical assessment reveals heart rate is normal, breath sounds clear to auscultation, no wheezes, rales, or rhonchi. Grip strength equal, strong. Distal pulses present. Jonathan Mcneil reports he  does take medications as prescribed. Patient states he  follows a regular  and renal diet. Pt on HD and goes T, Th, Sat. The patient reports no specific efforts to gain or lose weight. Pt's weight will be monitored closely. Demonstration and practice of PLB using pulse oximeter. Jonathan Mcneil able to return demonstration satisfactorily. Safety and hand hygiene in the exercise area reviewed with patient. Jonathan Mcneil voices  understanding of the information reviewed. Department expectations discussed with patient and achievable goals were set. The patient shows enthusiasm about attending the program and we look forward to working with Jonathan Mcneil. Jonathan Mcneil completed a 6 min walk test today and is scheduled to begin exercise on 02/05/24 at 1315.   1020-1115 Jonathan Mcneil

## 2024-01-29 ENCOUNTER — Encounter: Payer: Self-pay | Admitting: Podiatry

## 2024-01-29 ENCOUNTER — Ambulatory Visit: Admitting: Podiatry

## 2024-01-29 VITALS — Ht 69.5 in | Wt 219.8 lb

## 2024-01-29 DIAGNOSIS — B351 Tinea unguium: Secondary | ICD-10-CM | POA: Diagnosis not present

## 2024-01-29 DIAGNOSIS — M79675 Pain in left toe(s): Secondary | ICD-10-CM

## 2024-01-29 DIAGNOSIS — E1149 Type 2 diabetes mellitus with other diabetic neurological complication: Secondary | ICD-10-CM | POA: Diagnosis not present

## 2024-01-29 DIAGNOSIS — M79674 Pain in right toe(s): Secondary | ICD-10-CM

## 2024-01-29 NOTE — Progress Notes (Signed)
 Subjective: Chief Complaint  Patient presents with   Nail Problem    Pt is here for [facility].   68 y.o. returns the office today for painful, elongated, thickened toenails which he cannot trim himself.    He states that the shoes are doing very well and happy with them.  He does get a new pair next year as well.  No open lesions.  He is still taking gabapentin .   PCP: Verdia Lombard, MD   Objective: AAO 3, NAD DP/PT pulses palpable, CRT less than 3 seconds Sensation decreased with Gustabo Speed. Nails hypertrophic, dystrophic, elongated, brittle, discolored 10. There is tenderness overlying the nails 1-5 bilaterally. There is no surrounding erythema or drainage along the nail sites. No open lesions or pre-ulcerative lesions are identified. No pain with calf compression, swelling, warmth, erythema.  Assessment: Patient presents with symptomatic onychomycosis; type 2 diabetes with neuropathy  Plan: Symptomatic onychomycosis -Nails sharply debrided 10 without complication/bleeding. -Daily foot inspection - Likely plan for new diabetic shoes next year.  Return in about 3 months (around 04/30/2024) for nail trim.   [name] DPM

## 2024-02-02 NOTE — Progress Notes (Signed)
 [facility] - Clinic Note  02/11/2024     CHIEF COMPLAINT Patient presents for Retina Follow Up  HISTORY OF PRESENT ILLNESS: Jonathan Mcneil is a 68 y.o. male who presents to the clinic today for:   HPI     Retina Follow Up   Patient presents with  Diabetic Retinopathy.  In both eyes.  This started 4 weeks ago.  Duration of 4 weeks.  Since onset it is stable.  I, the attending physician,  performed the HPI with the patient and updated documentation appropriately.        Comments   4 week retina follow up PDR OU pt is reporting vision seems better he denies any flashes has some floaters his last reading unknown he is not checking currently A1C 5 range       Last edited by Valdemar Rogue, MD on 02/11/2024 12:11 PM.     Pt states the vision is stable.   Referring physician: Verdia Lombard, MD [address] [place],  [place] [address]  HISTORICAL INFORMATION:   Selected notes from the MEDICAL RECORD NUMBER Referred by Dr. Geroge Daniels for heme OD   CURRENT MEDICATIONS: Current Outpatient Medications (Ophthalmic Drugs)  Medication Sig   dorzolamide -timolol  (COSOPT ) 2-0.5 % ophthalmic solution INSTILL 1 DROP INTO BOTH EYES TWICE A DAY   No current facility-administered medications for this visit. (Ophthalmic Drugs)   Current Outpatient Medications (Other)  Medication Sig   acetaminophen  (TYLENOL ) 500 MG tablet Take 1,000 mg by mouth every 6 (six) hours as needed.   esomeprazole (NEXIUM) 40 MG capsule Take 40 mg by mouth daily.   gabapentin  (NEURONTIN ) 300 MG capsule Take 300 mg by mouth at bedtime.   loperamide  (IMODIUM ) 2 MG capsule Take 1 capsule (2 mg total) by mouth 4 (four) times daily as needed for diarrhea or loose stools.   methocarbamol  (ROBAXIN ) 500 MG tablet Take 1 tablet (500 mg total) by mouth every 8 (eight) hours as needed for muscle spasms.   metoprolol  succinate (TOPROL -XL) 100 MG 24 hr tablet Take 1 tablet (100 mg  total) by mouth daily.   sacubitril -valsartan  (ENTRESTO ) 24-26 MG Take 1 tablet by mouth 2 (two) times daily.   sertraline  (ZOLOFT ) 25 MG tablet Take 25 mg by mouth 3 (three) times a week. M W F   sucroferric oxyhydroxide (VELPHORO ) 500 MG chewable tablet Chew 500 mg by mouth 3 (three) times daily with meals.   traMADol  (ULTRAM ) 50 MG tablet Take 25 mg by mouth as needed. 3 x weekly after dialysis as needed for pain   No current facility-administered medications for this visit. (Other)   REVIEW OF SYSTEMS: ROS   Positive for: Gastrointestinal, Neurological, Genitourinary, Endocrine, Eyes Negative for: Constitutional, Skin, Musculoskeletal, HENT, Cardiovascular, Respiratory, Psychiatric, Allergic/Imm, Heme/Lymph Last edited by Resa Delon ORN, COT on 02/11/2024  8:28 AM.      ALLERGIES Allergies  Allergen Reactions   Gabapentin  Nausea Only   Ibuprofen Hives   Penicillins Itching and Other (See Comments)    Has patient had a PCN reaction causing immediate rash, facial/tongue/throat swelling, SOB or lightheadedness with hypotension: No Has patient had a PCN reaction causing severe rash involving mucus membranes or skin necrosis: No Has patient had a PCN reaction that required hospitalization No Has patient had a PCN reaction occurring within the last 10 years: Unknown If all of the above answers are NO, then may proceed with Cephalosporin use.   Venlafaxine Other (See Comments)  insomnia   PAST MEDICAL HISTORY Past Medical History:  Diagnosis Date   Diabetes mellitus without complication (HCC)    Diabetic retinopathy (HCC)    ESRD (end stage renal disease) (HCC)    GERD (gastroesophageal reflux disease)    PMH   Hypertension    Hypertensive retinopathy    Low back pain    Lung disease    Metabolic acidosis    Neuromuscular disorder (HCC)    peripheral neuropathy   Pancreatitis    Schizophrenia (HCC)    does not take medications   Past Surgical History:   Procedure Laterality Date   A/V FISTULAGRAM Left 06/03/2018   Procedure: A/V FISTULAGRAM;  Surgeon: Gretta Lonni PARAS, MD;  Location: [facility] INVASIVE [facility];  Service: Cardiovascular;  Laterality: Left;   AV FISTULA PLACEMENT Left 02/11/2018   Procedure: INSERTION OF ARTERIOVENOUS (AV) FISTULA LEFT  ARM;  Surgeon: Sheree Penne Lonni, MD;  Location: [facility] OR;  Service: Vascular;  Laterality: Left;   BASCILIC VEIN TRANSPOSITION Left 04/17/2018   Procedure: BASILIC VEIN TRANSPOSITION SECOND STAGE;  Surgeon: Sheree Penne Lonni, MD;  Location: [facility] OR;  Service: Vascular;  Laterality: Left;   BIOPSY  10/20/2019   Procedure: BIOPSY;  Surgeon: Rosalie Kitchens, MD;  Location: [facility] ENDOSCOPY;  Service: Endoscopy;;   CATARACT EXTRACTION     COLONOSCOPY     DENTAL SURGERY     ESOPHAGOGASTRODUODENOSCOPY N/A 06/16/2021   Procedure: ESOPHAGOGASTRODUODENOSCOPY (EGD);  Surgeon: Dianna Specking, MD;  Location: [facility] ENDOSCOPY;  Service: Gastroenterology;  Laterality: N/A;   ESOPHAGOGASTRODUODENOSCOPY (EGD) WITH PROPOFOL  N/A 10/20/2019   Procedure: ESOPHAGOGASTRODUODENOSCOPY (EGD) WITH PROPOFOL ;  Surgeon: Rosalie Kitchens, MD;  Location: [facility] ENDOSCOPY;  Service: Endoscopy;  Laterality: N/A;   INSERTION OF DIALYSIS CATHETER Right 02/25/2018   Procedure: INSERTION OF DIALYSIS CATHETER;  Surgeon: Sheree Penne Lonni, MD;  Location: [facility] OR;  Service: Vascular;  Laterality: Right;   PERIPHERAL VASCULAR BALLOON ANGIOPLASTY  06/03/2018   Procedure: PERIPHERAL VASCULAR BALLOON ANGIOPLASTY;  Surgeon: Gretta Lonni PARAS, MD;  Location: [facility] LAB;  Service: Cardiovascular;;  left a/v fistula   FAMILY HISTORY Family History  Problem Relation Age of Onset   Kidney failure Mother    Diabetes Father    Blindness Brother    SOCIAL HISTORY Social History   Tobacco Use   Smoking status: Former   Smokeless tobacco: Never  [employer]vertising [employer]   Vaping status: Never Used  Substance Use Topics   Alcohol  use: No    Drug use: Not Currently    Types: Marijuana    Comment: Quit about 5-6 yrs ago       OPHTHALMIC EXAM:  Base Eye Exam     Visual Acuity (Snellen - Linear)       Right Left   Dist [employer] 20/25 -2 20/25 -1   Dist ph [place] NI NI         Tonometry (Tonopen, 8:33 AM)       Right Left   Pressure 14 16         Pupils       Pupils Dark Light Shape React APD   Right PERRL 3 2 Round Brisk None   Left PERRL 3 2 Round Brisk None         Visual Fields       Left Right    Full Full         Extraocular Movement       Right Left    Full, Ortho Full, Ortho  Neuro/Psych     Oriented x3: Yes   Mood/Affect: Normal         Dilation     Both eyes: 2.5% Phenylephrine  @ 8:34 AM           Slit Lamp and Fundus Exam     Slit Lamp Exam       Right Left   Lids/Lashes Dermatochalasis - upper lid Dermatochalasis - upper lid   Conjunctiva/Sclera Melanosis Melanosis, temporal pinguecula   Cornea Mild arcus, well healed cataract wound arcus   Anterior Chamber deep and clear, no cell deep and clear   Iris Round and dilated, No NVI Round and dilated, No NVI   Lens PC IOL in good position, trace Posterior capsular opacification 3+ Nuclear sclerosis, 3+ Cortical cataract   Anterior Vitreous Vitreous syneresis, blood stained vit condensations clearing and settling inferiorly, old white blood clots settled inferiorly -- improving Vitreous syneresis, old white blood clots settled inferiorly -- minimal, no heme centrally, Posterior vitreous detachment         Fundus Exam       Right Left   Disc Mild Pallor, Sharp rim, focal PPP/PPA Mild Pallor, Sharp rim   C/D Ratio 0.5 0.5   Macula Flat, Good foveal reflex, RPE mottling and clumping, trace cystic changes temporal macula -- stably improved, rare MA, +ERM Flat, good foveal reflex, ERM, RPE mottling and clumping, rare MA, no edema   Vessels attenuated, mild tortuosity attenuated, Tortuous   Periphery Attached, 360  IRH/DBH - improved, pre-retinal heme settled inferiorly, 360 PRP with room for fill in Attached, scattered IRH/DBH greatest nasally, good 360 PRP changes with room for fill in, No RT/RD           IMAGING AND PROCEDURES  Imaging and Procedures for 02/11/2024  OCT, Retina - OU - Both Eyes       Right Eye Quality was good. Central Foveal Thickness: 275. Progression has improved. Findings include normal foveal contour, no IRF, no SRF, intraretinal hyper-reflective material, epiretinal membrane (Interval improvement in vitreous opacities, stable improvement in cystic changes temporal macula, mild ERM).   Left Eye Quality was good. Central Foveal Thickness: 316. Progression has been stable. Findings include no SRF, abnormal foveal contour, intraretinal hyper-reflective material, epiretinal membrane, intraretinal fluid, macular pucker (ERM with blunted foveal contour, trace cystic changes temporal macula ).   Notes *Images captured and stored on drive  Diagnosis / Impression:  OD: Interval improvement in vitreous opacities, stable improvement in cystic changes temporal macula, mild ERM OS: ERM with blunted foveal contour, trace cystic changes temporal macula   Clinical management:  See below  Abbreviations: NFP - Normal foveal profile. CME - cystoid macular edema. PED - pigment epithelial detachment. IRF - intraretinal fluid. SRF - subretinal fluid. EZ - ellipsoid zone. ERM - epiretinal membrane. ORA - outer retinal atrophy. ORT - outer retinal tubulation. SRHM - subretinal hyper-reflective material. IRHM - intraretinal hyper-reflective material      Intravitreal Injection, Pharmacologic Agent - OD - Right Eye       Time Out 02/11/2024. 9:05 AM. Confirmed correct patient, procedure, site, and patient consented.   Anesthesia Topical anesthesia was used. Anesthetic medications included Lidocaine  2%, Proparacaine 0.5%.   Procedure Preparation included 5% betadine to ocular surface,  eyelid speculum. A (32g) needle was used.   Injection: 1.25 mg Bevacizumab  1.25mg /0.75ml   Route: Intravitreal, Site: Right Eye   NDC: [id number], Lot: [id number], Expiration date: 04/29/2024   Post-op Post injection exam found visual acuity of  at least counting fingers. The patient tolerated the procedure well. There were no complications. The patient received written and verbal post procedure care education. Post injection medications were not given.             ASSESSMENT/PLAN:   ICD-10-CM   1. Proliferative diabetic retinopathy of both eyes with macular edema associated with type 2 diabetes mellitus (HCC)  E11.3513 OCT, Retina - OU - Both Eyes    Intravitreal Injection, Pharmacologic Agent - OD - Right Eye    Bevacizumab  (AVASTIN ) SOLN 1.25 mg    2. Vitreous hemorrhage of left eye (HCC)  H43.12     3. Vitreous hemorrhage, right eye (HCC)  H43.11     4. Posterior vitreous detachment of left eye  H43.812     5. Essential hypertension  I10     6. Hypertensive retinopathy of both eyes  H35.033     7. Epiretinal membrane (ERM) of left eye  H35.372     8. Combined forms of age-related cataract of left eye  H25.812     9. Pseudophakia  Z96.1     10. Bilateral ocular hypertension  H40.053      1-3. Proliferative diabetic retinopathy w/ recurrent VH OU (OD > OS)  **presented acutely on 10.22.25 for new VH OD -- onset Saturday 10.18.25  - A1c: 6.2 (06.23.25), 5.9 (03.13.24) - s/p IVA OD #1 (12.08.21), #2 (01.11.22), #3 (02.09.22), #4 (11.04.22), #5 (12.02.22), #6 (12.30.22), #7 (01.27.23), #8 (01.05.24), #9 (02.02.24), #10 (08.28.24), #11 (10.09.24), #12 (11.20.24), #13 (04.02.25), #14 (08.01.25), #15 (10.22.25) - s/p IVA OS #1 (12.10.21), #2 (01.11.22), #3 (02.09.22), #4 (09.09.22), #5 (10.07.22), #6 (11.04.22), #7 (12.02.22), #8 (12.30.22) - FA (12.08.21) shows late-leaking MA, vascular nonperfusion, +NV OU (nasal midzone) - s/p PRP OD (01.26.22) - s/p PRP OS (03.09.22) -  repeat FA 6.6.22 shows interval improvement in NVE/leakage OU s/p PRP OU-- just minimal leakage remains - repeat FA 10.09.24 shows OD: NV stably regressed, mild leaking MA; OS: Interval improvement in focal NVE IN periphery -- no leakage; mild perifoveal leakage -- both improved from prior - BCVA OD 20/25 from 20/30 - OCT shows OD: Interval improvement in vitreous opacities, stable improvement in cystic changes temporal macula, mild ERM; OS: ERM with blunted foveal contour, trace cystic changes temporal macula at 4 weeks - recommend IVA OD #16 today, 11.19.25 -- maintenance with f/u ext to 8 weeks - pt wishes to proceed with injection  - RBA of procedure discussed, questions answered - IVA informed consent obtained and signed, 10.22.25 (OD) - see procedure note  - f/u 8 weeks, DFE, OCT, possible injections  4. Hemorrhagic PVD OS  - Onset mid-August 2022 w/ new onset floaters, no photopsias  - s/p IVA OS #4 (09.09.22), #5 (10.07.22), #6 (11.04.22), #7 (12.02.22), #8 (12.30.22) - today, VA OD 20/25 from 20/20 - pt states they have been holding heparin  at dialysis - exam shows interval improvement in vitreous heme -- clearing and settling inferiorly - pt reports previously receiving heparin  during dialysis (T, Th, Sat) -- may have contributed to interval worsening   - Discussed findings and prognosis  - No RT or RD on 360 peripheral exam  - Reviewed s/s of RT/RD  - Strict return precautions for any such RT/RD signs/symptoms - VH precautions reviewed -- minimize activities, keep head elevated, hold heparin  if able, avoid ASA/NSAIDS  5,6. Hypertensive retinopathy OU - discussed importance of tight BP control - monitor  7. Epiretinal membrane, left eye  - mild  ERM - BCVA 20/25 from 20/20 - asymptomatic, no metamorphopsia - no indication for surgery at this time - monitor for now  8. Mixed Cataract OS - The symptoms of cataract, surgical options, and treatments and risks were discussed  with patient - discussed diagnosis and progression - left eye surgery scheduled for Friday, November 22  9. Pseudophakia OD  - s/p CE/IOL (Dr. Octavia)  - IOL in good position, doing well  - monitor  10. Ocular Hypertension OU  - IOP today: 14, 16  - cont Cosopt  bid OU--pt reports not taking as of 10.22.25    Ophthalmic Meds Ordered this visit:  Meds ordered this encounter  Medications   Bevacizumab  (AVASTIN ) SOLN 1.25 mg     Return in about 8 weeks (around 04/07/2024) for f/u, PDR, DFE, OCT, Possible, IVA, OD.  There are no Patient Instructions on file for this visit.  This document serves as a record of services personally performed by Redell JUDITHANN Hans, MD, PhD. It was created on their behalf by Almetta Pesa, an ophthalmic technician. The creation of this record is the provider's dictation and/or activities during the visit.    Electronically signed by: Almetta Pesa, OA, 02/11/24  12:17 PM  This document serves as a record of services personally performed by Redell JUDITHANN Hans, MD, PhD. It was created on their behalf by Wanda GEANNIE Keens, COT an ophthalmic technician. The creation of this record is the provider's dictation and/or activities during the visit.    Electronically signed by:  Wanda GEANNIE Keens, COT  02/11/24 12:17 PM  Redell JUDITHANN Hans, M.D., Ph.D. Diseases & Surgery of the Retina and Vitreous [facility]  I have reviewed the above documentation for accuracy and completeness, and I agree with the above. Redell JUDITHANN Hans, M.D., Ph.D. 02/11/24 12:17 PM    Abbreviations: M myopia (nearsighted); A astigmatism; H hyperopia (farsighted); P presbyopia; Mrx spectacle prescription;  CTL contact lenses; OD right eye; OS left eye; OU both eyes  XT exotropia; ET esotropia; PEK punctate epithelial keratitis; PEE punctate epithelial erosions; DES dry eye syndrome; MGD meibomian gland dysfunction; ATs artificial tears; PFAT's preservative free  artificial tears; NSC nuclear sclerotic cataract; PSC posterior subcapsular cataract; ERM epi-retinal membrane; PVD posterior vitreous detachment; RD retinal detachment; DM diabetes mellitus; DR diabetic retinopathy; NPDR non-proliferative diabetic retinopathy; PDR proliferative diabetic retinopathy; CSME clinically significant macular edema; DME diabetic macular edema; dbh dot blot hemorrhages; CWS cotton wool spot; POAG primary open angle glaucoma; C/D cup-to-disc ratio; HVF humphrey visual field; GVF goldmann visual field; OCT optical coherence tomography; IOP intraocular pressure; BRVO Branch retinal vein occlusion; CRVO central retinal vein occlusion; CRAO central retinal artery occlusion; BRAO branch retinal artery occlusion; RT retinal tear; SB scleral buckle; PPV pars plana vitrectomy; VH Vitreous hemorrhage; PRP panretinal laser photocoagulation; IVK intravitreal kenalog; VMT vitreomacular traction; MH Macular hole;  NVD neovascularization of the disc; NVE neovascularization elsewhere; AREDS age related eye disease study; ARMD age related macular degeneration; POAG primary open angle glaucoma; EBMD epithelial/anterior basement membrane dystrophy; ACIOL anterior chamber intraocular lens; IOL intraocular lens; PCIOL posterior chamber intraocular lens; Phaco/IOL phacoemulsification with intraocular lens placement; PRK photorefractive keratectomy; LASIK laser assisted in situ keratomileusis; HTN hypertension; DM diabetes mellitus; COPD chronic obstructive pulmonary disease

## 2024-02-04 ENCOUNTER — Encounter (HOSPITAL_COMMUNITY)
Admission: RE | Admit: 2024-02-04 | Discharge: 2024-02-04 | Disposition: A | Discharge status: Home / Self Care | Source: Ambulatory Visit | Attending: Cardiology | Admitting: Cardiology

## 2024-02-04 DIAGNOSIS — I5020 Unspecified systolic (congestive) heart failure: Secondary | ICD-10-CM | POA: Diagnosis present

## 2024-02-04 MED ORDER — TECHNETIUM TC 99M-LABELED RED BLOOD CELLS IV KIT
16.8000 | PACK | Freq: Once | INTRAVENOUS | Status: AC
  Administered 2024-02-04: 16.8 via INTRAVENOUS

## 2024-02-05 ENCOUNTER — Encounter (HOSPITAL_COMMUNITY)
Admission: RE | Admit: 2024-02-05 | Discharge: 2024-02-05 | Disposition: A | Discharge status: Home / Self Care | Source: Ambulatory Visit | Attending: Cardiology | Admitting: Cardiology

## 2024-02-05 DIAGNOSIS — I5020 Unspecified systolic (congestive) heart failure: Secondary | ICD-10-CM | POA: Diagnosis not present

## 2024-02-05 NOTE — Progress Notes (Signed)
 Daily Session Note  Patient Details  Name: Jonathan Mcneil MRN: [id number] Date of Birth: [birth year] Referring Provider:   [name] Pulmonary Rehab Walk Test from 01/26/2024 in [facility] for Heart, Vascular, & Lung Health  Referring Provider [name]    Encounter Date: 02/05/2024  Check In:  Session Check In - 02/05/24 1330       Check-In   Supervising physician immediately available to respond to emergencies [facility] MD immediately available    Physician(s) Barnie Hila, NP    Location [facility]    Staff Present Ronal Levin, RN, BSN;Casey Claudene, Neita Moats, MS, ACSM-CEP, Exercise Physiologist;Randi Midge HECKLE, ACSM-CEP, Exercise Physiologist    Virtual Visit No    Medication changes reported     No    Fall or balance concerns reported    No    Tobacco Cessation No Change    Warm-up and Cool-down Performed as group-led instruction    Resistance Training Performed No    VAD Patient? No    PAD/SET Patient? No      Pain Assessment   Currently in Pain? No/denies    Pain Score 0-No pain    Multiple Pain Sites No          Capillary Blood Glucose: No results found for this or any previous visit (from the past 24 hours).    Social History   Tobacco Use  Smoking Status Former  Smokeless Tobacco Never    Goals Met:  Exercise tolerated well Queuing for purse lip breathing No report of concerns or symptoms today Strength training completed today  Goals Unmet:  Not Applicable  Comments: Service time is from 1306 to 1452    Dr. Slater Staff is Medical Director for Pulmonary Rehab at [facility].

## 2024-02-06 ENCOUNTER — Ambulatory Visit (HOSPITAL_COMMUNITY): Payer: Self-pay | Admitting: Cardiology

## 2024-02-10 ENCOUNTER — Encounter (HOSPITAL_COMMUNITY)
Admission: RE | Admit: 2024-02-10 | Discharge: 2024-02-10 | Disposition: A | Source: Ambulatory Visit | Attending: Cardiology

## 2024-02-10 DIAGNOSIS — I5020 Unspecified systolic (congestive) heart failure: Secondary | ICD-10-CM

## 2024-02-10 NOTE — Progress Notes (Signed)
 Daily Session Note  Patient Details  Name: Jonathan Mcneil MRN: [id number] Date of Birth: Jun 20, 1955 Referring Provider:   [name] Pulmonary Rehab Walk Test from 01/26/2024 in [facility] for Heart, Vascular, & Lung Health  Referring Provider [name]    Encounter Date: 02/10/2024  Check In:  Session Check In - 02/10/24 1425       Check-In   Supervising physician immediately available to respond to emergencies [facility] MD immediately available    Physician(s) Josefa Beauvais, NP    Location [facility]    Staff Present Ronal Levin, RN, BSN;Casey Claudene, Neita Moats, MS, ACSM-CEP, Exercise Physiologist;Randi Midge BS, ACSM-CEP, Exercise Physiologist    Virtual Visit No    Medication changes reported     No    Fall or balance concerns reported    No    Tobacco Cessation No Change    Warm-up and Cool-down Performed as group-led instruction    Resistance Training Performed Yes    VAD Patient? No    PAD/SET Patient? No      Pain Assessment   Currently in Pain? No/denies    Pain Score 0-No pain    Multiple Pain Sites No          Capillary Blood Glucose: No results found for this or any previous visit (from the past 24 hours).    Social History   Tobacco Use  Smoking Status Former  Smokeless Tobacco Never    Goals Met:  Proper associated with RPD/PD & O2 Sat Exercise tolerated well No report of concerns or symptoms today Strength training completed today  Goals Unmet:  Not Applicable  Comments: Service time is from 1307 to 1456.    Dr. Slater Staff is Medical Director for Pulmonary Rehab at [facility].

## 2024-02-11 ENCOUNTER — Encounter (INDEPENDENT_AMBULATORY_CARE_PROVIDER_SITE_OTHER): Payer: Self-pay | Admitting: Ophthalmology

## 2024-02-11 ENCOUNTER — Ambulatory Visit (INDEPENDENT_AMBULATORY_CARE_PROVIDER_SITE_OTHER): Admitting: Ophthalmology

## 2024-02-11 DIAGNOSIS — I10 Essential (primary) hypertension: Secondary | ICD-10-CM

## 2024-02-11 DIAGNOSIS — E113513 Type 2 diabetes mellitus with proliferative diabetic retinopathy with macular edema, bilateral: Secondary | ICD-10-CM | POA: Diagnosis not present

## 2024-02-11 DIAGNOSIS — H43812 Vitreous degeneration, left eye: Secondary | ICD-10-CM

## 2024-02-11 DIAGNOSIS — H35033 Hypertensive retinopathy, bilateral: Secondary | ICD-10-CM

## 2024-02-11 DIAGNOSIS — H35372 Puckering of macula, left eye: Secondary | ICD-10-CM

## 2024-02-11 DIAGNOSIS — Z961 Presence of intraocular lens: Secondary | ICD-10-CM

## 2024-02-11 DIAGNOSIS — H4312 Vitreous hemorrhage, left eye: Secondary | ICD-10-CM

## 2024-02-11 DIAGNOSIS — H4313 Vitreous hemorrhage, bilateral: Secondary | ICD-10-CM

## 2024-02-11 DIAGNOSIS — H4311 Vitreous hemorrhage, right eye: Secondary | ICD-10-CM

## 2024-02-11 DIAGNOSIS — H25812 Combined forms of age-related cataract, left eye: Secondary | ICD-10-CM

## 2024-02-11 DIAGNOSIS — H40053 Ocular hypertension, bilateral: Secondary | ICD-10-CM

## 2024-02-11 MED ORDER — BEVACIZUMAB CHEMO INJECTION 1.25MG/0.05ML SYRINGE FOR KALEIDOSCOPE
1.2500 mg | INTRAVITREAL | Status: AC | PRN
  Administered 2024-02-11: 1.25 mg via INTRAVITREAL

## 2024-02-12 ENCOUNTER — Encounter (HOSPITAL_COMMUNITY)
Admission: RE | Admit: 2024-02-12 | Discharge: 2024-02-12 | Disposition: A | Source: Ambulatory Visit | Attending: Cardiology

## 2024-02-12 DIAGNOSIS — I5020 Unspecified systolic (congestive) heart failure: Secondary | ICD-10-CM

## 2024-02-12 NOTE — Progress Notes (Signed)
 Daily Session Note  Patient Details  Name: Jonathan Mcneil MRN: [id number] Date of Birth: [birth year] Referring Provider:   [name] Walk Test from 01/26/2024 in [facility]  Referring Provider [name]    Encounter Date: 02/12/2024  Check In:  Session Check In - 02/12/24 1334       Check-In   Supervising physician immediately available to respond to emergencies [facility] MD immediately available    Physician(s) Orren Fabry, PA    Location [facility]    Staff Present Ronal Levin, RN, BSN;Casey Claudene, Neita Moats, MS, ACSM-CEP, Exercise Physiologist;Randi Midge BS, ACSM-CEP, Exercise Physiologist    Virtual Visit No    Medication changes reported     No    Fall or balance concerns reported    No    Tobacco Cessation No Change    Warm-up and Cool-down Performed as group-led instruction    Resistance Training Performed Yes    VAD Patient? No    PAD/SET Patient? No      Pain Assessment   Currently in Pain? No/denies    Pain Score 0-No pain    Multiple Pain Sites No          Capillary Blood Glucose: No results found for this or any previous visit (from the past 24 hours).    Social History   Tobacco Use  Smoking Status Former  Smokeless Tobacco Never    Goals Met:  Exercise tolerated well Queuing for purse lip breathing No report of concerns or symptoms today Strength training completed today  Goals Unmet:  Not Applicable  Comments: Service time is from 1303 to 1442    Dr. Slater Staff is Medical Director for Pulmonary Rehab at [facility].

## 2024-02-13 ENCOUNTER — Encounter (INDEPENDENT_AMBULATORY_CARE_PROVIDER_SITE_OTHER): Admitting: Ophthalmology

## 2024-02-17 ENCOUNTER — Encounter (HOSPITAL_COMMUNITY)
Admission: RE | Admit: 2024-02-17 | Discharge: 2024-02-17 | Disposition: A | Discharge status: Home / Self Care | Source: Ambulatory Visit | Attending: Cardiology | Admitting: Cardiology

## 2024-02-17 VITALS — Wt 214.9 lb

## 2024-02-17 DIAGNOSIS — I5020 Unspecified systolic (congestive) heart failure: Secondary | ICD-10-CM | POA: Diagnosis not present

## 2024-02-17 NOTE — Progress Notes (Signed)
 Daily Session Note  Patient Details  Name: Jonathan Mcneil MRN: [id number] Date of Birth: December 11, 1955 Referring Provider:   [name] [facility] Rehab Walk Test from 01/26/2024 in [facility]  Referring Provider [name]    Encounter Date: 02/17/2024  Check In:  Session Check In - 02/17/24 1332       Check-In   Supervising physician immediately available to respond to emergencies [facility] MD immediately available    Physician(s) Rosabel Mose, NP    Location [facility]    Staff Present Ronal Levin, RN, BSN;Casey Claudene, Neita Moats, MS, ACSM-CEP, Exercise Physiologist;Baili Stang Midge BS, ACSM-CEP, Exercise Physiologist    Virtual Visit No    Medication changes reported     No    Fall or balance concerns reported    No    Tobacco Cessation No Change    Warm-up and Cool-down Performed as group-led instruction    Resistance Training Performed Yes    VAD Patient? No    PAD/SET Patient? No      Pain Assessment   Currently in Pain? No/denies    Pain Score 0-No pain    Multiple Pain Sites No          Capillary Blood Glucose: No results found for this or any previous visit (from the past 24 hours).   Exercise Prescription Changes - 02/17/24 1500       Response to Exercise   Blood Pressure (Admit) 118/74    Blood Pressure (Exercise) 120/60    Blood Pressure (Exit) 106/64    Heart Rate (Admit) 74 bpm    Heart Rate (Exercise) 101 bpm    Heart Rate (Exit) 92 bpm    Oxygen Saturation (Admit) 98 %    Oxygen Saturation (Exercise) 97 %    Oxygen Saturation (Exit) 100 %    Rating of Perceived Exertion (Exercise) 11    Perceived Dyspnea (Exercise) 0    Duration Continue with 30 min of aerobic exercise without signs/symptoms of physical distress.    Intensity THRR unchanged      Progression   Progression Continue to progress workloads to maintain intensity without signs/symptoms of physical distress.       Resistance Training   Training Prescription Yes    Weight blue bands    Reps 10-15    Time 10 Minutes      Treadmill   MPH 2    Grade 0    Minutes 15    METs 2.4      Recumbant Elliptical   Level 3    RPM 68    Watts 68    Minutes 15    METs 2.6          Social History   Tobacco Use  Smoking Status Former  Smokeless Tobacco Never    Goals Met:  Exercise tolerated well No report of concerns or symptoms today Strength training completed today  Goals Unmet:  Not Applicable  Comments: Service time is from 1310 to 1440.    Dr. Slater Staff is Medical Director for Pulmonary Rehab at [facility].

## 2024-02-18 NOTE — Progress Notes (Signed)
 Pulmonary Individual Treatment Plan  Patient Details  Name: Jonathan Mcneil MRN: [id number] Date of Birth: 11/13/1955 Referring Provider:   [name] Pulmonary Rehab Walk Test from 01/26/2024 in [facility]  Referring Provider [name]    Initial Encounter Date:  Flowsheet Row Pulmonary Rehab Walk Test from 01/26/2024 in [facility]  Date 01/26/24    Visit Diagnosis: HFrEF (heart failure with reduced ejection fraction) (HCC)  Patient's Home Medications on Admission:   Current Outpatient Medications:    acetaminophen  (TYLENOL ) 500 MG tablet, Take 1,000 mg by mouth every 6 (six) hours as needed., Disp: , Rfl:    dorzolamide -timolol  (COSOPT ) 2-0.5 % ophthalmic solution, INSTILL 1 DROP INTO BOTH EYES TWICE A DAY, Disp: 30 mL, Rfl: 2   esomeprazole (NEXIUM) 40 MG capsule, Take 40 mg by mouth daily., Disp: , Rfl:    gabapentin  (NEURONTIN ) 300 MG capsule, Take 300 mg by mouth at bedtime., Disp: , Rfl:    loperamide  (IMODIUM ) 2 MG capsule, Take 1 capsule (2 mg total) by mouth 4 (four) times daily as needed for diarrhea or loose stools., Disp: 12 capsule, Rfl: 0   methocarbamol  (ROBAXIN ) 500 MG tablet, Take 1 tablet (500 mg total) by mouth every 8 (eight) hours as needed for muscle spasms., Disp: 20 tablet, Rfl: 0   metoprolol  succinate (TOPROL -XL) 100 MG 24 hr tablet, Take 1 tablet (100 mg total) by mouth daily., Disp: 90 tablet, Rfl: 3   sacubitril -valsartan  (ENTRESTO ) 24-26 MG, Take 1 tablet by mouth 2 (two) times daily., Disp: 60 tablet, Rfl: 11   sertraline  (ZOLOFT ) 25 MG tablet, Take 25 mg by mouth 3 (three) times a week. M W F, Disp: , Rfl:    sucroferric oxyhydroxide (VELPHORO ) 500 MG chewable tablet, Chew 500 mg by mouth 3 (three) times daily with meals., Disp: , Rfl:    traMADol  (ULTRAM ) 50 MG tablet, Take 25 mg by mouth as needed. 3 x weekly after dialysis as needed  for pain, Disp: , Rfl:   Past Medical History: Past Medical History:  Diagnosis Date   Diabetes mellitus without complication (HCC)    Diabetic retinopathy (HCC)    ESRD (end stage renal disease) (HCC)    GERD (gastroesophageal reflux disease)    PMH   Hypertension    Hypertensive retinopathy    Low back pain    Lung disease    Metabolic acidosis    Neuromuscular disorder (HCC)    peripheral neuropathy   Pancreatitis    Schizophrenia (HCC)    does not take medications    Tobacco Use: Social History   Tobacco Use  Smoking Status Former  Smokeless Tobacco Never    Labs: Review Flowsheet  More data exists      Latest Ref Rng & Units 03/18/2021 03/19/2021 04/11/2021 06/15/2021 01/14/2024  Labs for ITP Cardiac and Pulmonary Rehab  Hemoglobin A1c 4.8 - 5.6 % - 5.3  - - -  Bicarbonate 20.0 - 28.0 mmol/L 25.1  - - 25.7  -  TCO2 22 - 32 mmol/L 26  26  - 32  27  22   O2 Saturation % 75.0  - - 98  -    Details       Multiple values from one day are sorted in reverse-chronological order         Capillary Blood Glucose: Lab Results  Component Value Date   GLUCAP 148 (  H) 08/13/2023   GLUCAP 102 (H) 10/17/2022   GLUCAP 157 (H) 06/15/2021   GLUCAP 122 (H) 12/07/2020   GLUCAP 99 12/07/2020     Pulmonary Assessment Scores:  Pulmonary Assessment Scores     Row Name 01/26/24 1158         ADL UCSD   ADL Phase Entry     SOB Score total 56       CAT Score   CAT Score 15       mMRC Score   mMRC Score 2       UCSD: Self-administered rating of dyspnea associated with activities of daily living (ADLs) 6-point scale (0 = not at all to 5 = maximal or unable to do because of breathlessness)  Scoring Scores range from 0 to 120.  Minimally important difference is 5 units  CAT: CAT can identify the health impairment of COPD patients and is better correlated with disease progression.  CAT has a scoring range of zero to 40. The CAT score is classified into four  groups of low (less than 10), medium (10 - 20), high (21-30) and very high (31-40) based on the impact level of disease on health status. A CAT score over 10 suggests significant symptoms.  A worsening CAT score could be explained by an exacerbation, poor medication adherence, poor inhaler technique, or progression of COPD or comorbid conditions.  CAT MCID is 2 points  mMRC: mMRC (Modified Medical Research Council) Dyspnea Scale is used to assess the degree of baseline functional disability in patients of respiratory disease due to dyspnea. No minimal important difference is established. A decrease in score of 1 point or greater is considered a positive change.   Pulmonary Function Assessment:  Pulmonary Function Assessment - 01/26/24 1158       Breath   Bilateral Breath Sounds Clear    Shortness of Breath Yes;Limiting activity          Exercise Target Goals: Exercise Program Goal: Individual exercise prescription set using results from initial 6 min walk test and THRR while considering  patient's activity barriers and safety.   Exercise Prescription Goal: Initial exercise prescription builds to 30-45 minutes a day of aerobic activity, 2-3 days per week.  Home exercise guidelines will be given to patient during program as part of exercise prescription that the participant will acknowledge.  Activity Barriers & Risk Stratification:  Activity Barriers & Cardiac Risk Stratification - 01/26/24 1156       Activity Barriers & Cardiac Risk Stratification   Activity Barriers Deconditioning;Muscular Weakness;Shortness of Breath;Other (comment)    Comments neuropathy B hands          6 Minute Walk:  6 Minute Walk     Row Name 01/26/24 1140         6 Minute Walk   Phase Initial     Distance 1242 feet     Walk Time 6 minutes     # of Rest Breaks 0     MPH 2.35     METS 2.95     RPE 9     Perceived Dyspnea  1     VO2 Peak 10.34     Symptoms No     Resting HR 87 bpm      Resting BP 118/64     Resting Oxygen Saturation  100 %     Exercise Oxygen Saturation  during 6 min walk 91 %     Max Ex. HR 114 bpm  Max Ex. BP 142/70     2 Minute Post BP 132/74       Interval HR   1 Minute HR 97     2 Minute HR 95     3 Minute HR 104     4 Minute HR 114     5 Minute HR 108     6 Minute HR 112     2 Minute Post HR 92     Interval Heart Rate? Yes       Interval Oxygen   Interval Oxygen? Yes     Baseline Oxygen Saturation % 100 %     1 Minute Oxygen Saturation % 99 %     1 Minute Liters of Oxygen 0 L     2 Minute Oxygen Saturation % 98 %     2 Minute Liters of Oxygen 0 L     3 Minute Oxygen Saturation % 94 %     3 Minute Liters of Oxygen 0 L     4 Minute Oxygen Saturation % 91 %     4 Minute Liters of Oxygen 0 L     5 Minute Oxygen Saturation % 96 %     5 Minute Liters of Oxygen 0 L     6 Minute Oxygen Saturation % 93 %     6 Minute Liters of Oxygen 0 L     2 Minute Post Oxygen Saturation % 99 %     2 Minute Post Liters of Oxygen 0 L        Oxygen Initial Assessment:  Oxygen Initial Assessment - 01/26/24 1158       Home Oxygen   Home Oxygen Device None    Sleep Oxygen Prescription None    Home Exercise Oxygen Prescription None    Home Resting Oxygen Prescription None      Initial 6 min Walk   Oxygen Used None      Program Oxygen Prescription   Program Oxygen Prescription None      Intervention   Short Term Goals To learn and understand importance of maintaining oxygen saturations>88%;To learn and demonstrate proper pursed lip breathing techniques or other breathing techniques. ;To learn and understand importance of monitoring SPO2 with pulse oximeter and demonstrate accurate use of the pulse oximeter.    Long  Term Goals Maintenance of O2 saturations>88%;Exhibits proper breathing techniques, such as pursed lip breathing or other method taught during program session;Verbalizes importance of monitoring SPO2 with pulse oximeter and return  demonstration          Oxygen Re-Evaluation:  Oxygen Re-Evaluation     Row Name 02/12/24 0851             Program Oxygen Prescription   Program Oxygen Prescription None         Home Oxygen   Home Oxygen Device None       Sleep Oxygen Prescription None       Home Exercise Oxygen Prescription None       Home Resting Oxygen Prescription None         Goals/Expected Outcomes   Short Term Goals To learn and understand importance of maintaining oxygen saturations>88%;To learn and demonstrate proper pursed lip breathing techniques or other breathing techniques. ;To learn and understand importance of monitoring SPO2 with pulse oximeter and demonstrate accurate use of the pulse oximeter.       Long  Term Goals Maintenance of O2 saturations>88%;Exhibits proper breathing techniques, such as pursed lip  breathing or other method taught during program session;Verbalizes importance of monitoring SPO2 with pulse oximeter and return demonstration       Goals/Expected Outcomes Compliance and understanding of oxygen saturation monitoring and breathing techniques to decrease shortness of breath.          Oxygen Discharge (Final Oxygen Re-Evaluation):  Oxygen Re-Evaluation - 02/12/24 0851       Program Oxygen Prescription   Program Oxygen Prescription None      Home Oxygen   Home Oxygen Device None    Sleep Oxygen Prescription None    Home Exercise Oxygen Prescription None    Home Resting Oxygen Prescription None      Goals/Expected Outcomes   Short Term Goals To learn and understand importance of maintaining oxygen saturations>88%;To learn and demonstrate proper pursed lip breathing techniques or other breathing techniques. ;To learn and understand importance of monitoring SPO2 with pulse oximeter and demonstrate accurate use of the pulse oximeter.    Long  Term Goals Maintenance of O2 saturations>88%;Exhibits proper breathing techniques, such as pursed lip breathing or other method taught  during program session;Verbalizes importance of monitoring SPO2 with pulse oximeter and return demonstration    Goals/Expected Outcomes Compliance and understanding of oxygen saturation monitoring and breathing techniques to decrease shortness of breath.          Initial Exercise Prescription:  Initial Exercise Prescription - 01/26/24 1100       Date of Initial Exercise RX and Referring Provider   Date 01/26/24    Referring Provider [name]    Expected Discharge Date 05/06/24      Treadmill   MPH 1.6    Grade 0    Minutes 15    METs 1.8      Recumbant Elliptical   Level 2    RPM 70    Watts 80    Minutes 15    METs 2      Prescription Details   Frequency (times per week) 2    Duration Progress to 30 minutes of continuous aerobic without signs/symptoms of physical distress      Intensity   THRR 40-80% of Max Heartrate 61-122    Ratings of Perceived Exertion 11-13    Perceived Dyspnea 0-4      Progression   Progression Continue to progress workloads to maintain intensity without signs/symptoms of physical distress.      Resistance Training   Training Prescription Yes    Weight blue bands    Reps 10-15          Perform Capillary Blood Glucose checks as needed.  Exercise Prescription Changes:   Exercise Prescription Changes     Row Name 02/17/24 1500             Response to Exercise   Blood Pressure (Admit) 118/74       Blood Pressure (Exercise) 120/60       Blood Pressure (Exit) 106/64       Heart Rate (Admit) 74 bpm       Heart Rate (Exercise) 101 bpm       Heart Rate (Exit) 92 bpm       Oxygen Saturation (Admit) 98 %       Oxygen Saturation (Exercise) 97 %       Oxygen Saturation (Exit) 100 %       Rating of Perceived Exertion (Exercise) 11       Perceived Dyspnea (Exercise) 0       Duration Continue with  30 min of aerobic exercise without signs/symptoms of physical distress.       Intensity THRR unchanged         Progression   Progression  Continue to progress workloads to maintain intensity without signs/symptoms of physical distress.         Resistance Training   Training Prescription Yes       Weight blue bands       Reps 10-15       Time 10 Minutes         Treadmill   MPH 2       Grade 0       Minutes 15       METs 2.4         Recumbant Elliptical   Level 3       RPM 68       Watts 68       Minutes 15       METs 2.6          Exercise Comments:   Exercise Goals and Review:   Exercise Goals     Row Name 01/26/24 1157             Exercise Goals   Increase Physical Activity Yes       Intervention Provide advice, education, support and counseling about physical activity/exercise needs.;Develop an individualized exercise prescription for aerobic and resistive training based on initial evaluation findings, risk stratification, comorbidities and participant's personal goals.       Expected Outcomes Short Term: Attend rehab on a regular basis to increase amount of physical activity.;Long Term: Exercising regularly at least 3-5 days a week.;Long Term: Add in home exercise to make exercise part of routine and to increase amount of physical activity.       Increase Strength and Stamina Yes       Intervention Provide advice, education, support and counseling about physical activity/exercise needs.;Develop an individualized exercise prescription for aerobic and resistive training based on initial evaluation findings, risk stratification, comorbidities and participant's personal goals.       Expected Outcomes Short Term: Increase workloads from initial exercise prescription for resistance, speed, and METs.;Short Term: Perform resistance training exercises routinely during rehab and add in resistance training at home;Long Term: Improve cardiorespiratory fitness, muscular endurance and strength as measured by increased METs and functional capacity ( )       Able to understand and use rate of perceived exertion (RPE)  scale Yes       Intervention Provide education and explanation on how to use RPE scale       Expected Outcomes Short Term: Able to use RPE daily in rehab to express subjective intensity level;Long Term:  Able to use RPE to guide intensity level when exercising independently       Able to understand and use Dyspnea scale Yes       Intervention Provide education and explanation on how to use Dyspnea scale       Expected Outcomes Long Term: Able to use Dyspnea scale to guide intensity level when exercising independently;Short Term: Able to use Dyspnea scale daily in rehab to express subjective sense of shortness of breath during exertion       Knowledge and understanding of Target Heart Rate Range (THRR) Yes       Intervention Provide education and explanation of THRR including how the numbers were predicted and where they are located for reference       Expected Outcomes Short  Term: Able to state/look up THRR;Short Term: Able to use daily as guideline for intensity in rehab;Long Term: Able to use THRR to govern intensity when exercising independently       Understanding of Exercise Prescription Yes       Intervention Provide education, explanation, and written materials on patient's individual exercise prescription       Expected Outcomes Short Term: Able to explain program exercise prescription;Long Term: Able to explain home exercise prescription to exercise independently          Exercise Goals Re-Evaluation :  Exercise Goals Re-Evaluation     Row Name 02/12/24 0845             Exercise Goal Re-Evaluation   Exercise Goals Review Increase Physical Activity;Able to understand and use Dyspnea scale;Understanding of Exercise Prescription;Increase Strength and Stamina;Knowledge and understanding of Target Heart Rate Range (THRR);Able to understand and use rate of perceived exertion (RPE) scale       Comments Jonathan Mcneil has completed 2 exercise sessions. He is walking on the treadmill for 15 min, up  to 2 mph, 0 incline, METs 2.4. He then is exercising on the recumbent elliptical for 15 min, level 2, METs 2.4. Jonathan Mcneil is tolerating well. He performs warm up and cool down standing, using blue bands, 5.8 lbs. Will progress as tolerated.       Expected Outcomes Through exercise at rehab and home, the patient will decrease shortness of breath with daily activities and feel confident in carrying out an exercise regimen at home.          Discharge Exercise Prescription (Final Exercise Prescription Changes):  Exercise Prescription Changes - 02/17/24 1500       Response to Exercise   Blood Pressure (Admit) 118/74    Blood Pressure (Exercise) 120/60    Blood Pressure (Exit) 106/64    Heart Rate (Admit) 74 bpm    Heart Rate (Exercise) 101 bpm    Heart Rate (Exit) 92 bpm    Oxygen Saturation (Admit) 98 %    Oxygen Saturation (Exercise) 97 %    Oxygen Saturation (Exit) 100 %    Rating of Perceived Exertion (Exercise) 11    Perceived Dyspnea (Exercise) 0    Duration Continue with 30 min of aerobic exercise without signs/symptoms of physical distress.    Intensity THRR unchanged      Progression   Progression Continue to progress workloads to maintain intensity without signs/symptoms of physical distress.      Resistance Training   Training Prescription Yes    Weight blue bands    Reps 10-15    Time 10 Minutes      Treadmill   MPH 2    Grade 0    Minutes 15    METs 2.4      Recumbant Elliptical   Level 3    RPM 68    Watts 68    Minutes 15    METs 2.6          Nutrition:  Target Goals: Understanding of nutrition guidelines, daily intake of sodium 1500mg , cholesterol 200mg , calories 30% from fat and 7% or less from saturated fats, daily to have 5 or more servings of fruits and vegetables.  Biometrics:  Pre Biometrics - 01/26/24 1157       Pre Biometrics   Grip Strength 19 kg           Nutrition Therapy Plan and Nutrition Goals:  Nutrition Therapy & Goals -  02/05/24 1421  Nutrition Therapy   Diet Renal Diet      Personal Nutrition Goals   Nutrition Goal Patient to limit sodium intake to <2300 mg per day.    Comments Patient with medical history significant for ESRD on HD, heart failure with reduced EF, HTN, DM2. Reports dry weight of 93 kg. Currently working with dietitian at dialysis clinic; endorses limiting foods such as potatoes, tomatoes likely due to potassium content and following 32 oz fluid restriction. Also reports working on reducing sodium intake; however voices some uncertainty regarding sources of high sodium foods. RD reviewed sources of sodium, label reading and ways to reduce sodium intake.      Intervention Plan   Intervention Prescribe, educate and counsel regarding individualized specific dietary modifications aiming towards targeted core components such as weight, hypertension, lipid management, diabetes, heart failure and other comorbidities.;Nutrition handout(s) given to patient.   Handout: Chronic Kidney Disease Stage 3-5 Nutrition Therapy   Expected Outcomes Short Term Goal: Understand basic principles of dietary content, such as calories, fat, sodium, cholesterol and nutrients.;Long Term Goal: Adherence to prescribed nutrition plan.          Nutrition Assessments:  MEDIFICTS Score Key: >=70 Need to make dietary changes  40-70 Heart Healthy Diet <= 40 Therapeutic Level Cholesterol Diet   Picture Your Plate Scores: <59 Unhealthy dietary pattern with much room for improvement. 41-50 Dietary pattern unlikely to meet recommendations for good health and room for improvement. 51-60 More healthful dietary pattern, with some room for improvement.  >60 Healthy dietary pattern, although there may be some specific behaviors that could be improved.    Nutrition Goals Re-Evaluation:   Nutrition Goals Discharge (Final Nutrition Goals Re-Evaluation):   Psychosocial: Target Goals: Acknowledge presence or absence of  significant depression and/or stress, maximize coping skills, provide positive support system. Participant is able to verbalize types and ability to use techniques and skills needed for reducing stress and depression.  Initial Review & Psychosocial Screening:  Initial Psych Review & Screening - 01/26/24 1203       Initial Review   Current issues with None Identified   Pt scored 6/15 on his PHQ 2/9 screening. Pt declined any additional needs, resources, or referrals.     Family Dynamics   Good Support System? Yes    Comments Great support from wife and family      Barriers   Psychosocial barriers to participate in program There are no identifiable barriers or psychosocial needs.      Screening Interventions   Interventions Encouraged to exercise;Provide feedback about the scores to participant    Expected Outcomes Short Term goal: Utilizing psychosocial counselor, staff and physician to assist with identification of specific Stressors or current issues interfering with healing process. Setting desired goal for each stressor or current issue identified.;Long Term Goal: Stressors or current issues are controlled or eliminated.;Short Term goal: Identification and review with participant of any Quality of Life or Depression concerns found by scoring the questionnaire.;Long Term goal: The participant improves quality of Life and PHQ9 Scores as seen by post scores and/or verbalization of changes          Quality of Life Scores:  Scores of 19 and below usually indicate a poorer quality of life in these areas.  A difference of  2-3 points is a clinically meaningful difference.  A difference of 2-3 points in the total score of the Quality of Life Index has been associated with significant improvement in overall quality of life, self-image, physical  symptoms, and general health in studies assessing change in quality of life.  PHQ-9: Review Flowsheet       01/26/2024  Depression screen PHQ 2/9   Decreased Interest 3  Down, Depressed, Hopeless 3  PHQ - 2 Score 6  Altered sleeping 3  Tired, decreased energy 3  Change in appetite 0  Feeling bad or failure about yourself  2  Trouble concentrating 0  Moving slowly or fidgety/restless 0  Suicidal thoughts 1  PHQ-9 Score 15   Difficult doing work/chores Somewhat difficult    Details       Data saved with a previous flowsheet row definition        Interpretation of Total Score  Total Score Depression Severity:  1-4 = Minimal depression, 5-9 = Mild depression, 10-14 = Moderate depression, 15-19 = Moderately severe depression, 20-27 = Severe depression   Psychosocial Evaluation and Intervention:  Psychosocial Evaluation - 01/26/24 1208       Psychosocial Evaluation & Interventions   Interventions Encouraged to exercise with the program and follow exercise prescription    Comments Pt denies any psy/soc barriers or concerns. Pt scored 6/15 on his PHQ 2/9 screening. Pt declined any additional needs, resources, or referrals. Per chart review, pt has hx of schizophrenia, currently not on meds    Expected Outcomes For Jonathan Mcneil free of any psy/soc barriers or concerns    Continue Psychosocial Services  Follow up required by staff          Psychosocial Re-Evaluation:  Psychosocial Re-Evaluation     Row Name 02/09/24 1151             Psychosocial Re-Evaluation   Current issues with None Identified  Pt denies any psy/soc needs or concerns       Comments Monthly psychosocial re-evaluation as follows: Pt denies any new psy/soc barriers or concerns. Pt declined any additional needs, resources, or referrals. Per chart review, pt has hx of schizophrenia, currently not on meds       Expected Outcomes For Jonathan Mcneil free of any psy/soc barriers or concerns       Interventions Encouraged to attend Pulmonary Rehabilitation for the exercise       Continue Psychosocial Services  Follow up required by  staff          Psychosocial Discharge (Final Psychosocial Re-Evaluation):  Psychosocial Re-Evaluation - 02/09/24 1151       Psychosocial Re-Evaluation   Current issues with None Identified   Pt denies any psy/soc needs or concerns   Comments Monthly psychosocial re-evaluation as follows: Pt denies any new psy/soc barriers or concerns. Pt declined any additional needs, resources, or referrals. Per chart review, pt has hx of schizophrenia, currently not on meds    Expected Outcomes For Jonathan Mcneil free of any psy/soc barriers or concerns    Interventions Encouraged to attend Pulmonary Rehabilitation for the exercise    Continue Psychosocial Services  Follow up required by staff          Education: Education Goals: Education classes will be provided on a weekly basis, covering required topics. Participant will state understanding/return demonstration of topics presented.  Learning Barriers/Preferences:  Learning Barriers/Preferences - 01/26/24 1212       Learning Barriers/Preferences   Learning Barriers None    Learning Preferences None          Education Topics: [employer] instruction that is supported by a PowerPoint presentation.  Instructor discusses importance of knowing and understanding resting, exercise, and post-exercise oxygen saturation, heart rate, and blood pressure. Oxygen saturation, heart rate, blood pressure, rating of perceived exertion, and dyspnea are reviewed along with a normal range for these values.    Exercise for the Pulmonary Patient Group instruction that is supported by a PowerPoint presentation. Instructor discusses benefits of exercise, core components of exercise, frequency, duration, and intensity of an exercise routine, importance of utilizing pulse oximetry during exercise, safety while exercising, and options of places to exercise outside of rehab.    MET Level  Group instruction provided by PowerPoint, verbal  discussion, and written material to support subject matter. Instructor reviews what METs are and how to increase METs.    Pulmonary Medications Verbally interactive group education provided by instructor with focus on inhaled medications and proper administration.   Anatomy and Physiology of the Respiratory System Group instruction provided by PowerPoint, verbal discussion, and written material to support subject matter. Instructor reviews respiratory cycle and anatomical components of the respiratory system and their functions. Instructor also reviews differences in obstructive and restrictive respiratory diseases with examples of each.  Flowsheet Row PULMONARY REHAB OTHER RESPIRATORY from 02/12/2024 in [facility]  Date 02/12/24  Educator RT  Instruction Review Code 1- Verbalizes Understanding    Oxygen Safety Group instruction provided by PowerPoint, verbal discussion, and written material to support subject matter. There is an overview of "What is Oxygen" and "Why do we need it".  Instructor also reviews how to create a safe environment for oxygen use, the importance of using oxygen as prescribed, and the risks of noncompliance. There is a brief discussion on traveling with oxygen and resources the patient may utilize.   Oxygen Use Group instruction provided by PowerPoint, verbal discussion, and written material to discuss how supplemental oxygen is prescribed and different types of oxygen supply systems. Resources for more information are provided.    Breathing Techniques Group instruction that is supported by demonstration and informational handouts. Instructor discusses the benefits of pursed lip and diaphragmatic breathing and detailed demonstration on how to perform both.     Risk Factor Reduction Group instruction that is supported by a PowerPoint presentation. Instructor discusses the definition of a risk factor,  different risk factors for pulmonary disease, and how the heart and lungs work together.   Pulmonary Diseases Group instruction provided by PowerPoint, verbal discussion, and written material to support subject matter. Instructor gives an overview of the different type of pulmonary diseases. There is also a discussion on risk factors and symptoms as well as ways to manage the diseases. Flowsheet Row PULMONARY REHAB OTHER RESPIRATORY from 02/05/2024 in [facility]  Date 02/05/24  Educator RT  Instruction Review Code 1- Verbalizes Understanding    Stress and Energy Conservation Group instruction provided by PowerPoint, verbal discussion, and written material to support subject matter. Instructor gives an overview of stress and the impact it can have on the body. Instructor also reviews ways to reduce stress. There is also a discussion on energy conservation and ways to conserve energy throughout the day.   Warning Signs and Symptoms Group instruction provided by PowerPoint, verbal discussion, and written material to support subject matter. Instructor reviews warning signs and symptoms of stroke, heart attack, cold and flu. Instructor also reviews ways to prevent the spread of infection.   Other Education Group or individual verbal, written, or  video instructions that support the educational goals of the pulmonary rehab program.    Knowledge Questionnaire Score:  Knowledge Questionnaire Score - 01/26/24 1203       Knowledge Questionnaire Score   Pre Score 12/18          Core Components/Risk Factors/Patient Goals at Admission:  Personal Goals and Risk Factors at Admission - 01/26/24 1212       Core Components/Risk Factors/Patient Goals on Admission   Improve shortness of breath with ADL's Yes    Intervention Provide education, individualized exercise plan and daily activity instruction to help decrease symptoms of SOB with  activities of daily living.    Expected Outcomes Short Term: Improve cardiorespiratory fitness to achieve a reduction of symptoms when performing ADLs;Long Term: Be able to perform more ADLs without symptoms or delay the onset of symptoms    Heart Failure Yes    Intervention Provide a combined exercise and nutrition program that is supplemented with education, support and counseling about heart failure. Directed toward relieving symptoms such as shortness of breath, decreased exercise tolerance, and extremity edema.    Expected Outcomes Improve functional capacity of life;Short term: Attendance in program 2-3 days a week with increased exercise capacity. Reported lower sodium intake. Reported increased fruit and vegetable intake. Reports medication compliance.;Short term: Daily weights obtained and reported for increase. Utilizing diuretic protocols set by physician.;Long term: Adoption of self-care skills and reduction of barriers for early signs and symptoms recognition and intervention leading to self-care maintenance.          Core Components/Risk Factors/Patient Goals Review:   Goals and Risk Factor Review     Row Name 02/09/24 1154             Core Components/Risk Factors/Patient Goals Review   Personal Goals Review Improve shortness of breath with ADL's;Develop more efficient breathing techniques such as purse lipped breathing and diaphragmatic breathing and practicing self-pacing with activity.;Heart Failure       Review Monthly review of patient's Core Components/Risk Factors/Patient Goals are as follows: Goal progressing for improving his shortness of breath with ADLs. Breeze is exercising on the treadmill and seated elliptical. His oxygen is stable on room air. He is working to build up his strength and stamina. Goal progressing for developing more efficient breathing techniques such as purse lipped breathing and diaphragmatic breathing; and practicing self-pacing with activity. Lino  can perform purse lipped breathing while short of breath. He demonstrates this while performing the warmup and exercising. He can initiate PLB on his own. He is working on diaphragmatic breathing at home but still needs assistance. We will continue to work on these breathing exercises with him. Goal progressing for improved signs and symptoms of heart failure. Lamari has had no HF exacerbations since he has started [employer]. Jahki will continue to benefit from the [facility] program for exercise and education.       Expected Outcomes Pt will show progress toward meeting expected goals and outcomes.          Core Components/Risk Factors/Patient Goals at Discharge (Final Review):   Goals and Risk Factor Review - 02/09/24 1154       Core Components/Risk Factors/Patient Goals Review   Personal Goals Review Improve shortness of breath with ADL's;Develop more efficient breathing techniques such as purse lipped breathing and diaphragmatic breathing and practicing self-pacing with activity.;Heart Failure    Review Monthly review of patient's Core Components/Risk Factors/Patient Goals are as follows: Goal progressing for improving his shortness of  breath with ADLs. Juanantonio is exercising on the treadmill and seated elliptical. His oxygen is stable on room air. He is working to build up his strength and stamina. Goal progressing for developing more efficient breathing techniques such as purse lipped breathing and diaphragmatic breathing; and practicing self-pacing with activity. Koy can perform purse lipped breathing while short of breath. He demonstrates this while performing the warmup and exercising. He can initiate PLB on his own. He is working on diaphragmatic breathing at home but still needs assistance. We will continue to work on these breathing exercises with him. Goal progressing for improved signs and symptoms of heart failure. Collen has had no HF exacerbations since he has started [employer]. Suliman will  continue to benefit from the [facility] program for exercise and education.    Expected Outcomes Pt will show progress toward meeting expected goals and outcomes.          ITP Comments: Pt is making expected progress toward Pulmonary Rehab goals after completing 4 session(s). Recommend continued exercise, life style modification, education, and utilization of breathing techniques to increase stamina and strength, while also decreasing shortness of breath with exertion.  Dr. Slater Staff is Medical Director for Pulmonary Rehab at [facility].

## 2024-02-24 ENCOUNTER — Encounter (HOSPITAL_COMMUNITY)
Admission: RE | Admit: 2024-02-24 | Discharge: 2024-02-24 | Disposition: A | Source: Ambulatory Visit | Attending: Cardiology

## 2024-02-24 DIAGNOSIS — I5020 Unspecified systolic (congestive) heart failure: Secondary | ICD-10-CM | POA: Diagnosis present

## 2024-02-24 NOTE — Progress Notes (Signed)
 Daily Session Note  Patient Details  Name: Jonathan Mcneil MRN: [id number] Date of Birth: 31-Dec-1955 Referring Provider:   [name] Pulmonary Rehab Walk Test from 01/26/2024 in [facility]  Referring Provider [name]    Encounter Date: 02/24/2024  Check In:  Session Check In - 02/24/24 1320       Check-In   Supervising physician immediately available to respond to emergencies [facility] MD immediately available    Physician(s) Damien Braver, NP    Location [facility]    Staff Present Ronal Levin, RN, BSN;Cornelious Bartolucci Claudene, Neita Moats, MS, ACSM-CEP, Exercise Physiologist;Randi Midge BS, ACSM-CEP, Exercise Physiologist    Virtual Visit No    Medication changes reported     No    Fall or balance concerns reported    No    Tobacco Cessation No Change    Warm-up and Cool-down Performed as group-led instruction    Resistance Training Performed Yes    VAD Patient? No    PAD/SET Patient? No      Pain Assessment   Currently in Pain? No/denies    Multiple Pain Sites No          Capillary Blood Glucose: No results found for this or any previous visit (from the past 24 hours).    Social History   Tobacco Use  Smoking Status Former  Smokeless Tobacco Never    Goals Met:  Proper associated with RPD/PD & O2 Sat Independence with exercise equipment Exercise tolerated well No report of concerns or symptoms today Strength training completed today  Goals Unmet:  Not Applicable  Comments: Service time is from 1307 to 1430.    Dr. Slater Staff is Medical Director for Pulmonary Rehab at [facility].

## 2024-02-26 ENCOUNTER — Encounter (HOSPITAL_COMMUNITY)
Admission: RE | Admit: 2024-02-26 | Discharge: 2024-02-26 | Disposition: A | Source: Ambulatory Visit | Attending: Cardiology

## 2024-02-26 DIAGNOSIS — I5020 Unspecified systolic (congestive) heart failure: Secondary | ICD-10-CM

## 2024-02-26 NOTE — Progress Notes (Signed)
 Daily Session Note  Patient Details  Name: Jonathan Mcneil MRN: [id number] Date of Birth: Mar 09, 1956 Referring Provider:   [name] Pulmonary Rehab Walk Test from 01/26/2024 in [facility]  Referring Provider [name]    Encounter Date: 02/26/2024  Check In:  Session Check In - 02/26/24 1314       Check-In   Supervising physician immediately available to respond to emergencies [facility] MD immediately available    Physician(s) Rosaline Bane, NP    Location [facility]    Staff Present Ronal Levin, RN, BSN;Casey Claudene, Neita Moats, MS, ACSM-CEP, Exercise Physiologist;Randi Midge BS, ACSM-CEP, Exercise Physiologist    Virtual Visit No    Medication changes reported     No    Fall or balance concerns reported    No    Tobacco Cessation No Change    Warm-up and Cool-down Performed as group-led instruction    Resistance Training Performed Yes    VAD Patient? No    PAD/SET Patient? No      Pain Assessment   Currently in Pain? No/denies    Pain Score 0-No pain    Multiple Pain Sites No          Capillary Blood Glucose: No results found for this or any previous visit (from the past 24 hours).    Social History   Tobacco Use  Smoking Status Former  Smokeless Tobacco Never    Goals Met:  Proper associated with RPD/PD & O2 Sat Exercise tolerated well No report of concerns or symptoms today Strength training completed today  Goals Unmet:  Not Applicable  Comments: Service time is from 1302 to 1443.    Dr. Slater Staff is Medical Director for Pulmonary Rehab at [facility]

## 2024-03-02 ENCOUNTER — Encounter (HOSPITAL_COMMUNITY)
Admission: RE | Admit: 2024-03-02 | Discharge: 2024-03-02 | Disposition: A | Source: Ambulatory Visit | Attending: Cardiology

## 2024-03-02 VITALS — Wt 218.3 lb

## 2024-03-02 DIAGNOSIS — I5020 Unspecified systolic (congestive) heart failure: Secondary | ICD-10-CM | POA: Diagnosis not present

## 2024-03-02 NOTE — Progress Notes (Signed)
 Daily Session Note  Patient Details  Name: Jonathan Mcneil MRN: [id number] Date of Birth: 03/25/1956 Referring Provider:   [name] Pulmonary Rehab Walk Test from 01/26/2024 in [facility]  Referring Provider [name]    Encounter Date: 03/02/2024  Check In:  Session Check In - 03/02/24 1323       Check-In   Physician(s) Rosaline Bane, NP    [facility]    Staff Present Ronal Levin, RN, BSN;Casey Claudene, Neita Moats, MS, ACSM-CEP, Exercise Physiologist;Randi Midge HECKLE, ACSM-CEP, Exercise Physiologist    Virtual Visit No    Medication changes reported     No    Fall or balance concerns reported    No    Tobacco Cessation No Change    Warm-up and Cool-down Performed as group-led instruction    Resistance Training Performed Yes    VAD Patient? No          Capillary Blood Glucose: No results found for this or any previous visit (from the past 24 hours).   Exercise Prescription Changes - 03/02/24 1500       Response to Exercise   Blood Pressure (Admit) 122/64    Blood Pressure (Exercise) 130/70    Blood Pressure (Exit) 114/60    Heart Rate (Admit) 79 bpm    Heart Rate (Exercise) 102 bpm    Heart Rate (Exit) 88 bpm    Oxygen Saturation (Admit) 100 %    Oxygen Saturation (Exercise) 96 %    Oxygen Saturation (Exit) 100 %    Rating of Perceived Exertion (Exercise) 12    Perceived Dyspnea (Exercise) 0    Duration Continue with 30 min of aerobic exercise without signs/symptoms of physical distress.    Intensity THRR unchanged      Progression   Progression Continue to progress workloads to maintain intensity without signs/symptoms of physical distress.      Resistance Training   Training Prescription Yes    Weight blue bands    Reps 10-15    Time 10 Minutes      Treadmill   MPH 2    Grade 0    Minutes 15    METs 2.4      Recumbant Elliptical   Level 3    Minutes 15     METs 2.6          Social History   Tobacco Use  Smoking Status Former  Smokeless Tobacco Never    Goals Met:  Independence with exercise equipment Exercise tolerated well Queuing for purse lip breathing No report of concerns or symptoms today Strength training completed today  Goals Unmet:  Not Applicable  Comments: Service time is from 1300 to 1445    Dr. Slater Staff is Medical Director for Pulmonary Rehab at [facility].

## 2024-03-04 ENCOUNTER — Encounter (HOSPITAL_COMMUNITY)
Admission: RE | Admit: 2024-03-04 | Discharge: 2024-03-04 | Disposition: A | Discharge status: Home / Self Care | Source: Ambulatory Visit | Attending: Cardiology | Admitting: Cardiology

## 2024-03-04 DIAGNOSIS — I5020 Unspecified systolic (congestive) heart failure: Secondary | ICD-10-CM | POA: Diagnosis not present

## 2024-03-04 NOTE — Progress Notes (Signed)
 Daily Session Note  Patient Details  Name: Nashawn Hillock MRN: [id number] Date of Birth: 1956-03-16 Referring Provider:   [name] Pulmonary Rehab Walk Test from 01/26/2024 in [facility]  Referring Provider [name]    Encounter Date: 03/04/2024  Check In:  Session Check In - 03/04/24 1302       Check-In   Supervising physician immediately available to respond to emergencies [facility] MD immediately available    Physician(s) [name], NP    Location [facility]    Staff Present Ronal Levin, RN, BSN;Loveah Like Claudene, Neita Moats, MS, ACSM-CEP, Exercise Physiologist;Randi Midge BS, ACSM-CEP, Exercise Physiologist    Virtual Visit No    Medication changes reported     No    Fall or balance concerns reported    No    Tobacco Cessation No Change    Warm-up and Cool-down Performed as group-led instruction    Resistance Training Performed Yes    VAD Patient? No    PAD/SET Patient? No      Pain Assessment   Currently in Pain? No/denies    Multiple Pain Sites No          Capillary Blood Glucose: No results found for this or any previous visit (from the past 24 hours).    Tobacco Use History[1]  Goals Met:  Proper associated with RPD/PD & O2 Sat Independence with exercise equipment Exercise tolerated well No report of concerns or symptoms today Strength training completed today  Goals Unmet:  Not Applicable  Comments: Service time is from 1313 to 1443.    Dr. Slater Staff is Medical Director for Pulmonary Rehab at [facility].     [1]  Social History Tobacco Use  Smoking Status Former  Smokeless Tobacco Never

## 2024-03-09 ENCOUNTER — Encounter (HOSPITAL_COMMUNITY)
Admission: RE | Admit: 2024-03-09 | Discharge: 2024-03-09 | Disposition: A | Source: Ambulatory Visit | Attending: Cardiology

## 2024-03-09 VITALS — Wt 221.6 lb

## 2024-03-09 DIAGNOSIS — I5020 Unspecified systolic (congestive) heart failure: Secondary | ICD-10-CM

## 2024-03-09 NOTE — Progress Notes (Signed)
 Daily Session Note  Patient Details  Name: Jonathan Mcneil MRN: [id number] Date of Birth: 1956/02/04 Referring Provider:   [name] Pulmonary Rehab Walk Test from 01/26/2024 in [facility]  Referring Provider [name]    Encounter Date: 03/09/2024  Check In:  Session Check In - 03/09/24 1326       Check-In   Supervising physician immediately available to respond to emergencies [facility] MD immediately available    Physician(s) Orren Fabry, NP    Location [facility]    Staff Present Ronal Levin, RN, BSN;Casey Claudene Neita Moats, MS, ACSM-CEP, Exercise Physiologist    Virtual Visit No    Medication changes reported     No    Fall or balance concerns reported    No    Tobacco Cessation No Change    Warm-up and Cool-down Performed as group-led instruction    Resistance Training Performed Yes    VAD Patient? No    PAD/SET Patient? No      Pain Assessment   Currently in Pain? No/denies    Multiple Pain Sites No          Capillary Blood Glucose: No results found for this or any previous visit (from the past 24 hours).    Tobacco Use History[1]  Goals Met:  Independence with exercise equipment Exercise tolerated well Queuing for purse lip breathing No report of concerns or symptoms today Strength training completed today  Goals Unmet:  Not Applicable  Comments: Service time is from 1303 to 1437    Dr. Slater Staff is Medical Director for Pulmonary Rehab at [facility].     [1]  Social History Tobacco Use  Smoking Status Former  Smokeless Tobacco Never

## 2024-03-11 ENCOUNTER — Encounter (HOSPITAL_COMMUNITY): Admission: RE | Admit: 2024-03-11

## 2024-03-16 ENCOUNTER — Encounter (HOSPITAL_COMMUNITY)
Admission: RE | Admit: 2024-03-16 | Discharge: 2024-03-16 | Disposition: A | Discharge status: Home / Self Care | Source: Ambulatory Visit | Attending: Cardiology | Admitting: Cardiology

## 2024-03-16 DIAGNOSIS — I5020 Unspecified systolic (congestive) heart failure: Secondary | ICD-10-CM

## 2024-03-17 ENCOUNTER — Telehealth (HOSPITAL_COMMUNITY): Payer: Self-pay

## 2024-03-17 NOTE — Telephone Encounter (Signed)
 Called and checked on pt since he missed class yesterday. His wife stated he forgot he had PR class, but would be there on 03/23/24.

## 2024-03-17 NOTE — Progress Notes (Signed)
 Pulmonary Individual Treatment Plan  Patient Details  Name: Jonathan Mcneil MRN: [id number] Date of Birth: [birth year] Referring Provider:   [name] Pulmonary Rehab Walk Test from 01/26/2024 in [facility]  Referring Provider [name]    Initial Encounter Date:  Flowsheet Row Pulmonary Rehab Walk Test from 01/26/2024 in [facility]  Date 01/26/24    Visit Diagnosis: HFrEF (heart failure with reduced ejection fraction) (HCC)  Patient's Home Medications on Admission:  Current Medications[1]  Past Medical History: Past Medical History:  Diagnosis Date   Diabetes mellitus without complication (HCC)    Diabetic retinopathy (HCC)    ESRD (end stage renal disease) (HCC)    GERD (gastroesophageal reflux disease)    PMH   Hypertension    Hypertensive retinopathy    Low back pain    Lung disease    Metabolic acidosis    Neuromuscular disorder (HCC)    peripheral neuropathy   Pancreatitis    Schizophrenia (HCC)    does not take medications    Tobacco Use: Tobacco Use History[2]  Labs: Review Flowsheet  More data exists      Latest Ref Rng & Units 03/18/2021 03/19/2021 04/11/2021 06/15/2021 01/14/2024  Labs for ITP Cardiac and Pulmonary Rehab  Hemoglobin A1c 4.8 - 5.6 % - 5.3  - - -  Bicarbonate 20.0 - 28.0 mmol/L 25.1  - - 25.7  -  TCO2 22 - 32 mmol/L 26  26  - 32  27  22   O2 Saturation % 75.0  - - 98  -    Details       Multiple values from one day are sorted in reverse-chronological order         Capillary Blood Glucose: Lab Results  Component Value Date   GLUCAP 148 (H) 08/13/2023   GLUCAP 102 (H) 10/17/2022   GLUCAP 157 (H) 06/15/2021   GLUCAP 122 (H) 12/07/2020   GLUCAP 99 12/07/2020     Pulmonary Assessment Scores:  Pulmonary Assessment Scores     Row Name 01/26/24 1158         ADL UCSD   ADL Phase Entry     SOB Score  total 56       CAT Score   CAT Score 15       mMRC Score   mMRC Score 2       UCSD: Self-administered rating of dyspnea associated with activities of daily living (ADLs) 6-point scale (0 = not at all to 5 = maximal or unable to do because of breathlessness)  Scoring Scores range from 0 to 120.  Minimally important difference is 5 units  CAT: CAT can identify the health impairment of COPD patients and is better correlated with disease progression.  CAT has a scoring range of zero to 40. The CAT score is classified into four groups of low (less than 10), medium (10 - 20), high (21-30) and very high (31-40) based on the impact level of disease on health status. A CAT score over 10 suggests significant symptoms.  A worsening CAT score could be explained by an exacerbation, poor medication adherence, poor inhaler technique, or progression of COPD or comorbid conditions.  CAT MCID is 2 points  mMRC: mMRC (Modified Medical Research Council) Dyspnea Scale is used to assess the degree of baseline functional disability in patients of respiratory disease due to dyspnea. No minimal important difference  is established. A decrease in score of 1 point or greater is considered a positive change.   Pulmonary Function Assessment:  Pulmonary Function Assessment - 01/26/24 1158       Breath   Bilateral Breath Sounds Clear    Shortness of Breath Yes;Limiting activity          Exercise Target Goals: Exercise Program Goal: Individual exercise prescription set using results from initial 6 min walk test and THRR while considering  patients activity barriers and safety.   Exercise Prescription Goal: Initial exercise prescription builds to 30-45 minutes a day of aerobic activity, 2-3 days per week.  Home exercise guidelines will be given to patient during program as part of exercise prescription that the participant will acknowledge.  Activity Barriers & Risk Stratification:  Activity Barriers &  Cardiac Risk Stratification - 01/26/24 1156       Activity Barriers & Cardiac Risk Stratification   Activity Barriers Deconditioning;Muscular Weakness;Shortness of Breath;Other (comment)    Comments neuropathy B hands          6 Minute Walk:  6 Minute Walk     Row Name 01/26/24 1140         6 Minute Walk   Phase Initial     Distance 1242 feet     Walk Time 6 minutes     # of Rest Breaks 0     MPH 2.35     METS 2.95     RPE 9     Perceived Dyspnea  1     VO2 Peak 10.34     Symptoms No     Resting HR 87 bpm     Resting BP 118/64     Resting Oxygen Saturation  100 %     Exercise Oxygen Saturation  during 6 min walk 91 %     Max Ex. HR 114 bpm     Max Ex. BP 142/70     2 Minute Post BP 132/74       Interval HR   1 Minute HR 97     2 Minute HR 95     3 Minute HR 104     4 Minute HR 114     5 Minute HR 108     6 Minute HR 112     2 Minute Post HR 92     Interval Heart Rate? Yes       Interval Oxygen   Interval Oxygen? Yes     Baseline Oxygen Saturation % 100 %     1 Minute Oxygen Saturation % 99 %     1 Minute Liters of Oxygen 0 L     2 Minute Oxygen Saturation % 98 %     2 Minute Liters of Oxygen 0 L     3 Minute Oxygen Saturation % 94 %     3 Minute Liters of Oxygen 0 L     4 Minute Oxygen Saturation % 91 %     4 Minute Liters of Oxygen 0 L     5 Minute Oxygen Saturation % 96 %     5 Minute Liters of Oxygen 0 L     6 Minute Oxygen Saturation % 93 %     6 Minute Liters of Oxygen 0 L     2 Minute Post Oxygen Saturation % 99 %     2 Minute Post Liters of Oxygen 0 L        Oxygen Initial Assessment:  Oxygen Initial Assessment - 01/26/24 1158       Home Oxygen   Home Oxygen Device None    Sleep Oxygen Prescription None    Home Exercise Oxygen Prescription None    Home Resting Oxygen Prescription None      Initial 6 min Walk   Oxygen Used None      Program Oxygen Prescription   Program Oxygen Prescription None      Intervention   Short Term  Goals To learn and understand importance of maintaining oxygen saturations>88%;To learn and demonstrate proper pursed lip breathing techniques or other breathing techniques. ;To learn and understand importance of monitoring SPO2 with pulse oximeter and demonstrate accurate use of the pulse oximeter.    Long  Term Goals Maintenance of O2 saturations>88%;Exhibits proper breathing techniques, such as pursed lip breathing or other method taught during program session;Verbalizes importance of monitoring SPO2 with pulse oximeter and return demonstration          Oxygen Re-Evaluation:  Oxygen Re-Evaluation     Row Name 02/12/24 0851 03/12/24 1422           Program Oxygen Prescription   Program Oxygen Prescription None None        Home Oxygen   Home Oxygen Device None None      Sleep Oxygen Prescription None None      Home Exercise Oxygen Prescription None None      Home Resting Oxygen Prescription None None        Goals/Expected Outcomes   Short Term Goals To learn and understand importance of maintaining oxygen saturations>88%;To learn and demonstrate proper pursed lip breathing techniques or other breathing techniques. ;To learn and understand importance of monitoring SPO2 with pulse oximeter and demonstrate accurate use of the pulse oximeter. To learn and understand importance of maintaining oxygen saturations>88%;To learn and demonstrate proper pursed lip breathing techniques or other breathing techniques. ;To learn and understand importance of monitoring SPO2 with pulse oximeter and demonstrate accurate use of the pulse oximeter.      Long  Term Goals Maintenance of O2 saturations>88%;Exhibits proper breathing techniques, such as pursed lip breathing or other method taught during program session;Verbalizes importance of monitoring SPO2 with pulse oximeter and return demonstration Maintenance of O2 saturations>88%;Exhibits proper breathing techniques, such as pursed lip breathing or other  method taught during program session;Verbalizes importance of monitoring SPO2 with pulse oximeter and return demonstration      Goals/Expected Outcomes Compliance and understanding of oxygen saturation monitoring and breathing techniques to decrease shortness of breath. Compliance and understanding of oxygen saturation monitoring and breathing techniques to decrease shortness of breath.         Oxygen Discharge (Final Oxygen Re-Evaluation):  Oxygen Re-Evaluation - 03/12/24 1422       Program Oxygen Prescription   Program Oxygen Prescription None      Home Oxygen   Home Oxygen Device None    Sleep Oxygen Prescription None    Home Exercise Oxygen Prescription None    Home Resting Oxygen Prescription None      Goals/Expected Outcomes   Short Term Goals To learn and understand importance of maintaining oxygen saturations>88%;To learn and demonstrate proper pursed lip breathing techniques or other breathing techniques. ;To learn and understand importance of monitoring SPO2 with pulse oximeter and demonstrate accurate use of the pulse oximeter.    Long  Term Goals Maintenance of O2 saturations>88%;Exhibits proper breathing techniques, such as pursed lip breathing or other method taught during program session;Verbalizes importance of  monitoring SPO2 with pulse oximeter and return demonstration    Goals/Expected Outcomes Compliance and understanding of oxygen saturation monitoring and breathing techniques to decrease shortness of breath.          Initial Exercise Prescription:  Initial Exercise Prescription - 01/26/24 1100       Date of Initial Exercise RX and Referring Provider   Date 01/26/24    Referring Provider [name]    Expected Discharge Date 05/06/24      Treadmill   MPH 1.6    Grade 0    Minutes 15    METs 1.8      Recumbant Elliptical   Level 2    RPM 70    Watts 80    Minutes 15    METs 2      Prescription Details   Frequency (times per week) 2    Duration  Progress to 30 minutes of continuous aerobic without signs/symptoms of physical distress      Intensity   THRR 40-80% of Max Heartrate 61-122    Ratings of Perceived Exertion 11-13    Perceived Dyspnea 0-4      Progression   Progression Continue to progress workloads to maintain intensity without signs/symptoms of physical distress.      Resistance Training   Training Prescription Yes    Weight blue bands    Reps 10-15          Perform Capillary Blood Glucose checks as needed.  Exercise Prescription Changes:   Exercise Prescription Changes     Row Name 02/17/24 1500 03/02/24 1500 03/09/24 1510         Response to Exercise   Blood Pressure (Admit) 118/74 122/64 122/60     Blood Pressure (Exercise) 120/60 130/70 --     Blood Pressure (Exit) 106/64 114/60 128/72     Heart Rate (Admit) 74 bpm 79 bpm 81 bpm     Heart Rate (Exercise) 101 bpm 102 bpm 97 bpm     Heart Rate (Exit) 92 bpm 88 bpm 84 bpm     Oxygen Saturation (Admit) 98 % 100 % 100 %     Oxygen Saturation (Exercise) 97 % 96 % 95 %     Oxygen Saturation (Exit) 100 % 100 % 99 %     Rating of Perceived Exertion (Exercise) 11 12 11      Perceived Dyspnea (Exercise) 0 0 0     Duration Continue with 30 min of aerobic exercise without signs/symptoms of physical distress. Continue with 30 min of aerobic exercise without signs/symptoms of physical distress. Continue with 30 min of aerobic exercise without signs/symptoms of physical distress.     Intensity THRR unchanged THRR unchanged THRR unchanged       Progression   Progression Continue to progress workloads to maintain intensity without signs/symptoms of physical distress. Continue to progress workloads to maintain intensity without signs/symptoms of physical distress. Continue to progress workloads to maintain intensity without signs/symptoms of physical distress.       Resistance Training   Training Prescription Yes Yes --     Weight blue bands blue bands blue bands      Reps 10-15 10-15 10-15     Time 10 Minutes 10 Minutes 10 Minutes       Treadmill   MPH 2 2 1.7     Grade 0 0 0     Minutes 15 15 15      METs 2.4 2.4 2.2  Recumbant Elliptical   Level 3 3 3      RPM 68 -- --     Watts 68 -- --     Minutes 15 15 15      METs 2.6 2.6 2.3        Exercise Comments:   Exercise Goals and Review:   Exercise Goals     Row Name 01/26/24 1157             Exercise Goals   Increase Physical Activity Yes       Intervention Provide advice, education, support and counseling about physical activity/exercise needs.;Develop an individualized exercise prescription for aerobic and resistive training based on initial evaluation findings, risk stratification, comorbidities and participant's personal goals.       Expected Outcomes Short Term: Attend rehab on a regular basis to increase amount of physical activity.;Long Term: Exercising regularly at least 3-5 days a week.;Long Term: Add in home exercise to make exercise part of routine and to increase amount of physical activity.       Increase Strength and Stamina Yes       Intervention Provide advice, education, support and counseling about physical activity/exercise needs.;Develop an individualized exercise prescription for aerobic and resistive training based on initial evaluation findings, risk stratification, comorbidities and participant's personal goals.       Expected Outcomes Short Term: Increase workloads from initial exercise prescription for resistance, speed, and METs.;Short Term: Perform resistance training exercises routinely during rehab and add in resistance training at home;Long Term: Improve cardiorespiratory fitness, muscular endurance and strength as measured by increased METs and functional capacity ( )       Able to understand and use rate of perceived exertion (RPE) scale Yes       Intervention Provide education and explanation on how to use RPE scale       Expected Outcomes Short  Term: Able to use RPE daily in rehab to express subjective intensity level;Long Term:  Able to use RPE to guide intensity level when exercising independently       Able to understand and use Dyspnea scale Yes       Intervention Provide education and explanation on how to use Dyspnea scale       Expected Outcomes Long Term: Able to use Dyspnea scale to guide intensity level when exercising independently;Short Term: Able to use Dyspnea scale daily in rehab to express subjective sense of shortness of breath during exertion       Knowledge and understanding of Target Heart Rate Range (THRR) Yes       Intervention Provide education and explanation of THRR including how the numbers were predicted and where they are located for reference       Expected Outcomes Short Term: Able to state/look up THRR;Short Term: Able to use daily as guideline for intensity in rehab;Long Term: Able to use THRR to govern intensity when exercising independently       Understanding of Exercise Prescription Yes       Intervention Provide education, explanation, and written materials on patient's individual exercise prescription       Expected Outcomes Short Term: Able to explain program exercise prescription;Long Term: Able to explain home exercise prescription to exercise independently          Exercise Goals Re-Evaluation :  Exercise Goals Re-Evaluation     Row Name 02/12/24 0845 03/12/24 1421           Exercise Goal Re-Evaluation   Exercise Goals Review Increase Physical  Activity;Able to understand and use Dyspnea scale;Understanding of Exercise Prescription;Increase Strength and Stamina;Knowledge and understanding of Target Heart Rate Range (THRR);Able to understand and use rate of perceived exertion (RPE) scale Increase Physical Activity;Able to understand and use Dyspnea scale;Understanding of Exercise Prescription;Increase Strength and Stamina;Knowledge and understanding of Target Heart Rate Range (THRR);Able to  understand and use rate of perceived exertion (RPE) scale      Comments Jonathan Mcneil has completed 2 exercise sessions. He is walking on the treadmill for 15 min, up to 2 mph, 0 incline, METs 2.4. He then is exercising on the recumbent elliptical for 15 min, level 2, METs 2.4. Jonathan Mcneil is tolerating well. He performs warm up and cool down standing, using blue bands, 5.8 lbs. Will progress as tolerated. Jonathan Mcneil has completed 9 exercise sessions. He is walking on the treadmill for 15 min, up to 1.7-2 mph, 0 incline, METs 2.4 max. He then is exercising on the recumbent elliptical for 15 min, level 3, METs 2.4. Jonathan Mcneil comes to PR after dialysis therefore sometimes has days that he is more fatigued. He performs warm up and cool down standing, using blue bands, 5.8 lbs. Will progress as tolerated.      Expected Outcomes Through exercise at rehab and home, the patient will decrease shortness of breath with daily activities and feel confident in carrying out an exercise regimen at home. Through exercise at rehab and home, the patient will decrease shortness of breath with daily activities and feel confident in carrying out an exercise regimen at home.         Discharge Exercise Prescription (Final Exercise Prescription Changes):  Exercise Prescription Changes - 03/09/24 1510       Response to Exercise   Blood Pressure (Admit) 122/60    Blood Pressure (Exit) 128/72    Heart Rate (Admit) 81 bpm    Heart Rate (Exercise) 97 bpm    Heart Rate (Exit) 84 bpm    Oxygen Saturation (Admit) 100 %    Oxygen Saturation (Exercise) 95 %    Oxygen Saturation (Exit) 99 %    Rating of Perceived Exertion (Exercise) 11    Perceived Dyspnea (Exercise) 0    Duration Continue with 30 min of aerobic exercise without signs/symptoms of physical distress.    Intensity THRR unchanged      Progression   Progression Continue to progress workloads to maintain intensity without signs/symptoms of physical distress.      Resistance Training    Weight blue bands    Reps 10-15    Time 10 Minutes      Treadmill   MPH 1.7    Grade 0    Minutes 15    METs 2.2      Recumbant Elliptical   Level 3    Minutes 15    METs 2.3          Nutrition:  Target Goals: Understanding of nutrition guidelines, daily intake of sodium 1500mg , cholesterol 200mg , calories 30% from fat and 7% or less from saturated fats, daily to have 5 or more servings of fruits and vegetables.  Biometrics:  Pre Biometrics - 01/26/24 1157       Pre Biometrics   Grip Strength 19 kg           Nutrition Therapy Plan and Nutrition Goals:  Nutrition Therapy & Goals - 02/05/24 1421       Nutrition Therapy   Diet Renal Diet      Personal Nutrition Goals   Nutrition Goal  Patient to limit sodium intake to <2300 mg per day.    Comments Patient with medical history significant for ESRD on HD, heart failure with reduced EF, HTN, DM2. Reports dry weight of 93 kg. Currently working with dietitian at dialysis clinic; endorses limiting foods such as potatoes, tomatoes likely due to potassium content and following 32 oz fluid restriction. Also reports working on reducing sodium intake; however voices some uncertainty regarding sources of high sodium foods. RD reviewed sources of sodium, label reading and ways to reduce sodium intake.      Intervention Plan   Intervention Prescribe, educate and counsel regarding individualized specific dietary modifications aiming towards targeted core components such as weight, hypertension, lipid management, diabetes, heart failure and other comorbidities.;Nutrition handout(s) given to patient.   Handout: Chronic Kidney Disease Stage 3-5 Nutrition Therapy   Expected Outcomes Short Term Goal: Understand basic principles of dietary content, such as calories, fat, sodium, cholesterol and nutrients.;Long Term Goal: Adherence to prescribed nutrition plan.          Nutrition Assessments:  MEDIFICTS Score Key: >=70 Need to make  dietary changes  40-70 Heart Healthy Diet <= 40 Therapeutic Level Cholesterol Diet   Picture Your Plate Scores: <59 Unhealthy dietary pattern with much room for improvement. 41-50 Dietary pattern unlikely to meet recommendations for good health and room for improvement. 51-60 More healthful dietary pattern, with some room for improvement.  >60 Healthy dietary pattern, although there may be some specific behaviors that could be improved.    Nutrition Goals Re-Evaluation:   Nutrition Goals Discharge (Final Nutrition Goals Re-Evaluation):   Psychosocial: Target Goals: Acknowledge presence or absence of significant depression and/or stress, maximize coping skills, provide positive support system. Participant is able to verbalize types and ability to use techniques and skills needed for reducing stress and depression.  Initial Review & Psychosocial Screening:  Initial Psych Review & Screening - 01/26/24 1203       Initial Review   Current issues with None Identified   Pt scored 6/15 on his PHQ 2/9 screening. Pt declined any additional needs, resources, or referrals.     Family Dynamics   Good Support System? Yes    Comments Great support from wife and family      Barriers   Psychosocial barriers to participate in program There are no identifiable barriers or psychosocial needs.      Screening Interventions   Interventions Encouraged to exercise;Provide feedback about the scores to participant    Expected Outcomes Short Term goal: Utilizing psychosocial counselor, staff and physician to assist with identification of specific Stressors or current issues interfering with healing process. Setting desired goal for each stressor or current issue identified.;Long Term Goal: Stressors or current issues are controlled or eliminated.;Short Term goal: Identification and review with participant of any Quality of Life or Depression concerns found by scoring the questionnaire.;Long Term goal: The  participant improves quality of Life and PHQ9 Scores as seen by post scores and/or verbalization of changes          Quality of Life Scores:  Scores of 19 and below usually indicate a poorer quality of life in these areas.  A difference of  2-3 points is a clinically meaningful difference.  A difference of 2-3 points in the total score of the Quality of Life Index has been associated with significant improvement in overall quality of life, self-image, physical symptoms, and general health in studies assessing change in quality of life.  PHQ-9: Review Flowsheet  01/26/2024  Depression screen PHQ 2/9  Decreased Interest 3  Down, Depressed, Hopeless 3  PHQ - 2 Score 6  Altered sleeping 3  Tired, decreased Mcneil 3  Change in appetite 0  Feeling bad or failure about yourself  2  Trouble concentrating 0  Moving slowly or fidgety/restless 0  Suicidal thoughts 1  PHQ-9 Score 15   Difficult doing work/chores Somewhat difficult    Details       Data saved with a previous flowsheet row definition        Interpretation of Total Score  Total Score Depression Severity:  1-4 = Minimal depression, 5-9 = Mild depression, 10-14 = Moderate depression, 15-19 = Moderately severe depression, 20-27 = Severe depression   Psychosocial Evaluation and Intervention:  Psychosocial Evaluation - 01/26/24 1208       Psychosocial Evaluation & Interventions   Interventions Encouraged to exercise with the program and follow exercise prescription    Comments Pt denies any psy/soc barriers or concerns. Pt scored 6/15 on his PHQ 2/9 screening. Pt declined any additional needs, resources, or referrals. Per chart review, pt has hx of schizophrenia, currently not on meds    Expected Outcomes For Jonathan Mcneil to participate in PR free of any psy/soc barriers or concerns    Continue Psychosocial Services  Follow up required by staff          Psychosocial Re-Evaluation:  Psychosocial Re-Evaluation      Row Name 02/09/24 1151 03/10/24 1338           Psychosocial Re-Evaluation   Current issues with None Identified  Pt denies any psy/soc needs or concerns None Identified  Pt denies any psy/soc needs or concerns      Comments Monthly psychosocial re-evaluation as follows: Pt denies any new psy/soc barriers or concerns. Pt declined any additional needs, resources, or referrals. Per chart review, pt has hx of schizophrenia, currently not on meds Monthly psychosocial re-evaluation as follows: Pt denies any new psy/soc barriers or concerns. Jonathan Mcneil is motiviated to increase his strength and stamnia to get a kidney transplant. He denies any psy/soc needs and states she has great support from his wife. He declined any additional needs, resources, or referrals. Per chart review, pt has hx of schizophrenia, currently not on meds, pt states he is stable.      Expected Outcomes For Jonathan Mcneil to participate in PR free of any psy/soc barriers or concerns For Jonathan Mcneil to participate in PR free of any psy/soc barriers or concerns      Interventions Encouraged to attend Pulmonary Rehabilitation for the exercise Encouraged to attend Pulmonary Rehabilitation for the exercise      Continue Psychosocial Services  Follow up required by staff Follow up required by staff         Psychosocial Discharge (Final Psychosocial Re-Evaluation):  Psychosocial Re-Evaluation - 03/10/24 1338       Psychosocial Re-Evaluation   Current issues with None Identified   Pt denies any psy/soc needs or concerns   Comments Monthly psychosocial re-evaluation as follows: Pt denies any new psy/soc barriers or concerns. Jonathan Mcneil is motiviated to increase his strength and stamnia to get a kidney transplant. He denies any psy/soc needs and states she has great support from his wife. He declined any additional needs, resources, or referrals. Per chart review, pt has hx of schizophrenia, currently not on meds, pt states he is stable.    Expected Outcomes  For Jonathan Mcneil to participate in PR free of any psy/soc barriers or  concerns    Interventions Encouraged to attend Pulmonary Rehabilitation for the exercise    Continue Psychosocial Services  Follow up required by staff          Education: Education Goals: Education classes will be provided on a weekly basis, covering required topics. Participant will state understanding/return demonstration of topics presented.  Learning Barriers/Preferences:  Learning Barriers/Preferences - 01/26/24 1212       Learning Barriers/Preferences   Learning Barriers None    Learning Preferences None          Education Topics: Know Your Numbers Group instruction that is supported by a PowerPoint presentation. Instructor discusses importance of knowing and understanding resting, exercise, and post-exercise oxygen saturation, heart rate, and blood pressure. Oxygen saturation, heart rate, blood pressure, rating of perceived exertion, and dyspnea are reviewed along with a normal range for these values.    Exercise for the Pulmonary Patient Group instruction that is supported by a PowerPoint presentation. Instructor discusses benefits of exercise, core components of exercise, frequency, duration, and intensity of an exercise routine, importance of utilizing pulse oximetry during exercise, safety while exercising, and options of places to exercise outside of rehab.  Flowsheet Row PULMONARY REHAB OTHER RESPIRATORY from 03/04/2024 in [facility]  Date 03/04/24  Educator EP  Instruction Review Code 1- Verbalizes Understanding    MET Level  Group instruction provided by PowerPoint, verbal discussion, and written material to support subject matter. Instructor reviews what METs are and how to increase METs.    Pulmonary Medications Verbally interactive group education provided by instructor with focus on inhaled medications and proper  administration. Flowsheet Row PULMONARY REHAB OTHER RESPIRATORY from 02/26/2024 in [facility]  Date 02/26/24  Educator RT  Instruction Review Code 1- Verbalizes Understanding    Anatomy and Physiology of the Respiratory System Group instruction provided by PowerPoint, verbal discussion, and written material to support subject matter. Instructor reviews respiratory cycle and anatomical components of the respiratory system and their functions. Instructor also reviews differences in obstructive and restrictive respiratory diseases with examples of each.  Flowsheet Row PULMONARY REHAB OTHER RESPIRATORY from 02/12/2024 in [facility], Vascular, [facility] Lung [facility]  Date 02/12/24  Educator RT  Instruction Review Code 1- Verbalizes Understanding    Oxygen Safety Group instruction provided by PowerPoint, verbal discussion, and written material to support subject matter. There is an overview of What is Oxygen and Why do we need it.  Instructor also reviews how to create a safe environment for oxygen use, the importance of using oxygen as prescribed, and the risks of noncompliance. There is a brief discussion on traveling with oxygen and resources the patient may utilize.   Oxygen Use Group instruction provided by PowerPoint, verbal discussion, and written material to discuss how supplemental oxygen is prescribed and different types of oxygen supply systems. Resources for more information are provided.    Breathing Techniques Group instruction that is supported by demonstration and informational handouts. Instructor discusses the benefits of pursed lip and diaphragmatic breathing and detailed demonstration on how to perform both.     Risk Factor Reduction Group instruction that is supported by a PowerPoint presentation. Instructor discusses the definition of a risk factor, different risk factors for pulmonary  disease, and how the heart and lungs work together.   Pulmonary Diseases Group instruction provided by PowerPoint, verbal discussion, and written material to support  subject matter. Instructor gives an overview of the different type of pulmonary diseases. There is also a discussion on risk factors and symptoms as well as ways to manage the diseases. Flowsheet Row PULMONARY REHAB OTHER RESPIRATORY from 02/05/2024 in [facility] for Heart, Vascular, & Lung Health  Date 02/05/24  Educator RT  Instruction Review Code 1- Verbalizes Understanding    Stress and [place] Conservation Group instruction provided by PowerPoint, verbal discussion, and written material to support subject matter. Instructor gives an overview of stress and the impact it can have on the body. Instructor also reviews ways to reduce stress. There is also a discussion on [place] conservation and ways to conserve Mc[place] throughout the day.   Warning Signs and Symptoms Group instruction provided by PowerPoint, verbal discussion, and written material to support subject matter. Instructor reviews warning signs and symptoms of stroke, heart attack, cold and flu. Instructor also reviews ways to prevent the spread of infection.   Other Education Group or individual verbal, written, or video instructions that support the educational goals of the pulmonary rehab program.    Knowledge Questionnaire Score:  Knowledge Questionnaire Score - 01/26/24 1203       Knowledge Questionnaire Score   Pre Score 12/18          Core Components/Risk Factors/Patient Goals at Admission:  Personal Goals and Risk Factors at Admission - 03/10/24 1340       Core Components/Risk Factors/Patient Goals on Admission   Improve shortness of breath with ADL's Yes    Intervention Provide education, individualized exercise plan and daily activity instruction to help decrease symptoms of SOB with activities of daily living.     Expected Outcomes Short Term: Improve cardiorespiratory fitness to achieve a reduction of symptoms when performing ADLs;Long Term: Be able to perform more ADLs without symptoms or delay the onset of symptoms    Heart Failure Yes    Intervention Provide a combined exercise and nutrition program that is supplemented with education, support and counseling about heart failure. Directed toward relieving symptoms such as shortness of breath, decreased exercise tolerance, and extremity edema.    Expected Outcomes Improve functional capacity of life;Short term: Attendance in program 2-3 days a week with increased exercise capacity. Reported lower sodium intake. Reported increased fruit and vegetable intake. Reports medication compliance.;Short term: Daily weights obtained and reported for increase. Utilizing diuretic protocols set by physician.;Long term: Adoption of self-care skills and reduction of barriers for early signs and symptoms recognition and intervention leading to self-care maintenance.          Core Components/Risk Factors/Patient Goals Review:   Goals and Risk Factor Review     Row Name 02/09/24 1154 03/10/24 1340           Core Components/Risk Factors/Patient Goals Review   Personal Goals Review Improve shortness of breath with ADL's;Develop more efficient breathing techniques such as purse lipped breathing and diaphragmatic breathing and practicing self-pacing with activity.;Heart Failure Improve shortness of breath with ADL's;Develop more efficient breathing techniques such as purse lipped breathing and diaphragmatic breathing and practicing self-pacing with activity.;Heart Failure      Review Monthly review of patient's Core Components/Risk Factors/Patient Goals are as follows: Goal progressing for improving his shortness of breath with ADLs. Jonathan Mcneil is exercising on the treadmill and seated elliptical. His oxygen is stable on room air. He is working to build up his strength and stamina.  Goal progressing for developing more efficient breathing techniques such as purse lipped breathing  and diaphragmatic breathing; and practicing self-pacing with activity. Jonathan Mcneil can perform purse lipped breathing while short of breath. He demonstrates this while performing the warmup and exercising. He can initiate PLB on his own. He is working on diaphragmatic breathing at home but still needs assistance. We will continue to work on these breathing exercises with him. Goal progressing for improved signs and symptoms of heart failure. Lean has had no HF exacerbations since he has started Jonathan Mcneil. Jonathan Mcneil will continue to benefit from the PR program for exercise and education. Monthly review of patient's Core Components/Risk Factors/Patient Goals are as follows: Goal progressing for improving his shortness of breath with ADLs. Jonathan Mcneil is exercising on the treadmill and seated elliptical. His oxygen is stable on room air. He is working to build up his strength and stamina for a possible kidney transplant. Goal met for developing more efficient breathing techniques such as purse lipped breathing and diaphragmatic breathing; and practicing self-pacing with activity. Jonathan Mcneil can perform purse lipped breathing while short of breath. He demonstrates this while performing the warmup and exercising. He can initiate PLB on his own. He is working on diaphragmatic breathing at home. Goal progressing for improved signs and symptoms of heart failure. Jonathan Mcneil has had no HF exacerbations since he has started Jonathan Mcneil. He is compliant with his medications, weighing daily and eating a low sodium diet. Jonathan Mcneil will continue to benefit from the PR program for nutrition, exercise, education and lifestyle modification.      Expected Outcomes Pt will show progress toward meeting expected goals and outcomes. Pt will show progress toward meeting expected goals and outcomes.         Core Components/Risk Factors/Patient Goals at Discharge  (Final Review):   Goals and Risk Factor Review - 03/10/24 1340       Core Components/Risk Factors/Patient Goals Review   Personal Goals Review Improve shortness of breath with ADL's;Develop more efficient breathing techniques such as purse lipped breathing and diaphragmatic breathing and practicing self-pacing with activity.;Heart Failure    Review Monthly review of patient's Core Components/Risk Factors/Patient Goals are as follows: Goal progressing for improving his shortness of breath with ADLs. Jonathan Mcneil is exercising on the treadmill and seated elliptical. His oxygen is stable on room air. He is working to build up his strength and stamina for a possible kidney transplant. Goal met for developing more efficient breathing techniques such as purse lipped breathing and diaphragmatic breathing; and practicing self-pacing with activity. Jonathan Mcneil can perform purse lipped breathing while short of breath. He demonstrates this while performing the warmup and exercising. He can initiate PLB on his own. He is working on diaphragmatic breathing at home. Goal progressing for improved signs and symptoms of heart failure. Jonathan Mcneil has had no HF exacerbations since he has started Jonathan Mcneil. He is compliant with his medications, weighing daily and eating a low sodium diet. Jonathan Mcneil will continue to benefit from the PR program for nutrition, exercise, education and lifestyle modification.    Expected Outcomes Pt will show progress toward meeting expected goals and outcomes.          ITP Comments:   Comments: Pt is making expected progress toward Pulmonary Rehab goals after completing 9 session(s). Recommend continued exercise, life style modification, education, and utilization of breathing techniques to increase stamina and strength, while also decreasing shortness of breath with exertion.  Dr. Slater Staff is Medical Director for Pulmonary Rehab at [facility].       [1]  Current Outpatient Medications:  acetaminophen  (TYLENOL ) 500 MG tablet, Take 1,000 mg by mouth every 6 (six) hours as needed., Disp: , Rfl:    dorzolamide -timolol  (COSOPT ) 2-0.5 % ophthalmic solution, INSTILL 1 DROP INTO BOTH EYES TWICE A DAY, Disp: 30 mL, Rfl: 2   esomeprazole (NEXIUM) 40 MG capsule, Take 40 mg by mouth daily., Disp: , Rfl:    gabapentin  (NEURONTIN ) 300 MG capsule, Take 300 mg by mouth at bedtime., Disp: , Rfl:    loperamide  (IMODIUM ) 2 MG capsule, Take 1 capsule (2 mg total) by mouth 4 (four) times daily as needed for diarrhea or loose stools., Disp: 12 capsule, Rfl: 0   methocarbamol  (ROBAXIN ) 500 MG tablet, Take 1 tablet (500 mg total) by mouth every 8 (eight) hours as needed for muscle spasms., Disp: 20 tablet, Rfl: 0   metoprolol  succinate (TOPROL -XL) 100 MG 24 hr tablet, Take 1 tablet (100 mg total) by mouth daily., Disp: 90 tablet, Rfl: 3   sacubitril -valsartan  (ENTRESTO ) 24-26 MG, Take 1 tablet by mouth 2 (two) times daily., Disp: 60 tablet, Rfl: 11   sertraline  (ZOLOFT ) 25 MG tablet, Take 25 mg by mouth 3 (three) times a week. M W F, Disp: , Rfl:    sucroferric oxyhydroxide (VELPHORO ) 500 MG chewable tablet, Chew 500 mg by mouth 3 (three) times daily with meals., Disp: , Rfl:    traMADol  (ULTRAM ) 50 MG tablet, Take 25 mg by mouth as needed. 3 x weekly after dialysis as needed for pain, Disp: , Rfl:  [2]  Social History Tobacco Use  Smoking Status Former  Smokeless Tobacco Never

## 2024-03-23 ENCOUNTER — Encounter (HOSPITAL_COMMUNITY)
Admission: RE | Admit: 2024-03-23 | Discharge: 2024-03-23 | Disposition: A | Discharge status: Home / Self Care | Source: Ambulatory Visit | Attending: Cardiology | Admitting: Cardiology

## 2024-03-23 DIAGNOSIS — I5020 Unspecified systolic (congestive) heart failure: Secondary | ICD-10-CM

## 2024-03-23 NOTE — Progress Notes (Signed)
 Daily Session Note  Patient Details  Name: Jonathan Mcneil MRN: [id number] Date of Birth: 14-May-1955 Referring Provider:   [name] Pulmonary Rehab Walk Test from 01/26/2024 in [facility], [facility]  Referring Provider [name]    Encounter Date: 03/23/2024  Check In:  Session Check In - 03/23/24 1327       Check-In   Supervising physician immediately available to respond to emergencies [facility] MD immediately available    Physician(s) Orren Fabry, NP    Location [facility]    Staff Present Ronal Levin, RN, Maud Moats, MS, ACSM-CEP, Exercise Physiologist;Destin Kittler Midge HECKLE, ACSM-CEP, Exercise Physiologist    Virtual Visit No    Medication changes reported     No    Fall or balance concerns reported    No    Tobacco Cessation No Change    Warm-up and Cool-down Performed as group-led instruction    Resistance Training Performed Yes    VAD Patient? No    PAD/SET Patient? No      Pain Assessment   Currently in Pain? No/denies    Pain Score 0-No pain    Multiple Pain Sites No          Capillary Blood Glucose: No results found for this or any previous visit (from the past 24 hours).    Tobacco Use History[1]  Goals Met:  Exercise tolerated well No report of concerns or symptoms today Strength training completed today  Goals Unmet:  Not Applicable  Comments: Service time is from 1310 to 1435.    Dr. Slater Staff is Medical Director for Pulmonary Rehab at [facility].     [1]  Social History Tobacco Use  Smoking Status Former  Smokeless Tobacco Never

## 2024-03-26 NOTE — Progress Notes (Shared)
 " [facility] - Clinic Note  04/07/2024     CHIEF COMPLAINT Patient presents for No chief complaint on file.  HISTORY OF PRESENT ILLNESS: Jonathan Mcneil is a 69 y.o. male who presents to the clinic today for:     Pt states the vision is stable.   Referring physician: Verdia Lombard, MD [address] [place],  [place] [address]  HISTORICAL INFORMATION:   Selected notes from the MEDICAL RECORD NUMBER Referred by Dr. Geroge Daniels for heme OD   CURRENT MEDICATIONS: Current Outpatient Medications (Ophthalmic Drugs)  Medication Sig   dorzolamide -timolol  (COSOPT ) 2-0.5 % ophthalmic solution INSTILL 1 DROP INTO BOTH EYES TWICE A DAY   No current facility-administered medications for this visit. (Ophthalmic Drugs)   Current Outpatient Medications (Other)  Medication Sig   acetaminophen  (TYLENOL ) 500 MG tablet Take 1,000 mg by mouth every 6 (six) hours as needed.   esomeprazole (NEXIUM) 40 MG capsule Take 40 mg by mouth daily.   gabapentin  (NEURONTIN ) 300 MG capsule Take 300 mg by mouth at bedtime.   loperamide  (IMODIUM ) 2 MG capsule Take 1 capsule (2 mg total) by mouth 4 (four) times daily as needed for diarrhea or loose stools.   methocarbamol  (ROBAXIN ) 500 MG tablet Take 1 tablet (500 mg total) by mouth every 8 (eight) hours as needed for muscle spasms.   metoprolol  succinate (TOPROL -XL) 100 MG 24 hr tablet Take 1 tablet (100 mg total) by mouth daily.   sacubitril -valsartan  (ENTRESTO ) 24-26 MG Take 1 tablet by mouth 2 (two) times daily.   sertraline  (ZOLOFT ) 25 MG tablet Take 25 mg by mouth 3 (three) times a week. M W F   sucroferric oxyhydroxide (VELPHORO ) 500 MG chewable tablet Chew 500 mg by mouth 3 (three) times daily with meals.   traMADol  (ULTRAM ) 50 MG tablet Take 25 mg by mouth as needed. 3 x weekly after dialysis as needed for pain   No current facility-administered medications for this visit. (Other)   REVIEW OF  SYSTEMS:    ALLERGIES Allergies  Allergen Reactions   Gabapentin  Nausea Only   Ibuprofen Hives   Penicillins Itching and Other (See Comments)    Has patient had a PCN reaction causing immediate rash, facial/tongue/throat swelling, SOB or lightheadedness with hypotension: No Has patient had a PCN reaction causing severe rash involving mucus membranes or skin necrosis: No Has patient had a PCN reaction that required hospitalization No Has patient had a PCN reaction occurring within the last 10 years: Unknown If all of the above answers are NO, then may proceed with Cephalosporin use.   Venlafaxine Other (See Comments)    insomnia   PAST MEDICAL HISTORY Past Medical History:  Diagnosis Date   Diabetes mellitus without complication (HCC)    Diabetic retinopathy (HCC)    ESRD (end stage renal disease) (HCC)    GERD (gastroesophageal reflux disease)    PMH   Hypertension    Hypertensive retinopathy    Low back pain    Lung disease    Metabolic acidosis    Neuromuscular disorder (HCC)    peripheral neuropathy   Pancreatitis    Schizophrenia (HCC)    does not take medications   Past Surgical History:  Procedure Laterality Date   A/V FISTULAGRAM Left 06/03/2018   Procedure: A/V FISTULAGRAM;  Surgeon: Gretta Lonni PARAS, MD;  Location: [facility];  Service: Cardiovascular;  Laterality: Left;   AV FISTULA PLACEMENT Left 02/11/2018   Procedure: INSERTION  OF ARTERIOVENOUS (AV) FISTULA LEFT  ARM;  Surgeon: Sheree Penne Bruckner, MD;  Location: [facility] OR;  Service: Vascular;  Laterality: Left;   BASCILIC VEIN TRANSPOSITION Left 04/17/2018   Procedure: BASILIC VEIN TRANSPOSITION SECOND STAGE;  Surgeon: Sheree Penne Bruckner, MD;  Location: [facility] OR;  Service: Vascular;  Laterality: Left;   BIOPSY  10/20/2019   Procedure: BIOPSY;  Surgeon: Rosalie Kitchens, MD;  Location: [facility] ENDOSCOPY;  Service: Endoscopy;;   CATARACT EXTRACTION     COLONOSCOPY     DENTAL SURGERY      ESOPHAGOGASTRODUODENOSCOPY N/A 06/16/2021   Procedure: ESOPHAGOGASTRODUODENOSCOPY (EGD);  Surgeon: Dianna Specking, MD;  Location: [facility] ENDOSCOPY;  Service: Gastroenterology;  Laterality: N/A;   ESOPHAGOGASTRODUODENOSCOPY (EGD) WITH PROPOFOL  N/A 10/20/2019   Procedure: ESOPHAGOGASTRODUODENOSCOPY (EGD) WITH PROPOFOL ;  Surgeon: Rosalie Kitchens, MD;  Location: [facility] ENDOSCOPY;  Service: Endoscopy;  Laterality: N/A;   INSERTION OF DIALYSIS CATHETER Right 02/25/2018   Procedure: INSERTION OF DIALYSIS CATHETER;  Surgeon: Sheree Penne Bruckner, MD;  Location: [facility] OR;  Service: Vascular;  Laterality: Right;   PERIPHERAL VASCULAR BALLOON ANGIOPLASTY  06/03/2018   Procedure: PERIPHERAL VASCULAR BALLOON ANGIOPLASTY;  Surgeon: Gretta Bruckner PARAS, MD;  Location: [facility];  Service: Cardiovascular;;  left a/v fistula   FAMILY HISTORY Family History  Problem Relation Age of Onset   Kidney failure Mother    Diabetes Father    Blindness Brother    SOCIAL HISTORY Social History   Tobacco Use   Smoking status: Former   Smokeless tobacco: Never  [employer]vertising [employer]   Vaping status: Never Used  Substance Use Topics   Alcohol  use: No   Drug use: Not Currently    Types: Marijuana    Comment: Quit about 5-6 yrs ago       OPHTHALMIC EXAM:  Not recorded    IMAGING AND PROCEDURES  Imaging and Procedures for 04/07/2024           ASSESSMENT/PLAN: No diagnosis found.  1-3. Proliferative diabetic retinopathy w/ recurrent VH OU (OD > OS)  **presented acutely on 10.22.25 for new VH OD -- onset Saturday 10.18.25  - A1c: 6.2 (06.23.25), 5.9 (03.13.24) - s/p IVA OD #1 (12.08.21), #2 (01.11.22), #3 (02.09.22), #4 (11.04.22), #5 (12.02.22), #6 (12.30.22), #7 (01.27.23), #8 (01.05.24), #9 (02.02.24), #10 (08.28.24), #11 (10.09.24), #12 (11.20.24), #13 (04.02.25), #14 (08.01.25), #15 (10.22.25), #16 (11.19.25) - s/p IVA OS #1 (12.10.21), #2 (01.11.22), #3 (02.09.22), #4 (09.09.22), #5 (10.07.22), #6  (11.04.22), #7 (12.02.22), #8 (12.30.22) - FA (12.08.21) shows late-leaking MA, vascular nonperfusion, +NV OU (nasal midzone) - s/p PRP OD (01.26.22) - s/p PRP OS (03.09.22) - repeat FA 6.6.22 shows interval improvement in NVE/leakage OU s/p PRP OU-- just minimal leakage remains - repeat FA 10.09.24 shows OD: NV stably regressed, mild leaking MA; OS: Interval improvement in focal NVE IN periphery -- no leakage; mild perifoveal leakage -- both improved from prior - BCVA OD 20/25 from 20/30 - OCT shows OD: Interval improvement in vitreous opacities, stable improvement in cystic changes temporal macula, mild ERM; OS: ERM with blunted foveal contour, trace cystic changes temporal macula at 4 weeks - recommend IVA OD #17 today, 01.14.26 -- maintenance with f/u ext to 8 weeks - pt wishes to proceed with injection  - RBA of procedure discussed, questions answered - IVA informed consent obtained and signed, 10.22.25 (OD) - see procedure note  - f/u 8 weeks, DFE, OCT, possible injections  4. Hemorrhagic PVD OS  - Onset mid-August 2022 w/ new onset floaters, no  photopsias  - s/p IVA OS #4 (09.09.22), #5 (10.07.22), #6 (11.04.22), #7 (12.02.22), #8 (12.30.22) - today, VA OD 20/25 from 20/20 - pt states they have been holding heparin  at dialysis - exam shows interval improvement in vitreous heme -- clearing and settling inferiorly - pt reports previously receiving heparin  during dialysis (T, Th, Sat) -- may have contributed to interval worsening   - Discussed findings and prognosis  - No RT or RD on 360 peripheral exam  - Reviewed s/s of RT/RD  - Strict return precautions for any such RT/RD signs/symptoms - VH precautions reviewed -- minimize activities, keep head elevated, hold heparin  if able, avoid ASA/NSAIDS  5,6. Hypertensive retinopathy OU - discussed importance of tight BP control - monitor  7. Epiretinal membrane, left eye  - mild ERM - BCVA 20/25 from 20/20 - asymptomatic, no  metamorphopsia - no indication for surgery at this time - monitor for now  8. Mixed Cataract OS - The symptoms of cataract, surgical options, and treatments and risks were discussed with patient - discussed diagnosis and progression - left eye surgery scheduled for Friday, November 22  9. Pseudophakia OD  - s/p CE/IOL (Dr. Octavia)  - IOL in good position, doing well  - monitor  10. Ocular Hypertension OU  - IOP today: 14, 16  - cont Cosopt  bid OU--pt reports not taking as of 10.22.25    Ophthalmic Meds Ordered this visit:  No orders of the defined types were placed in this encounter.    No follow-ups on file.  There are no Patient Instructions on file for this visit.  This document serves as a record of services personally performed by Redell JUDITHANN Hans, MD, PhD. It was created on their behalf by Almetta Pesa, an ophthalmic technician. The creation of this record is the provider's dictation and/or activities during the visit.    Electronically signed by: Almetta Pesa, OA, 03/26/2024  2:12 PM   Redell JUDITHANN Hans, M.D., Ph.D. Diseases & Surgery of the Retina and Vitreous [facility]   Abbreviations: M myopia (nearsighted); A astigmatism; H hyperopia (farsighted); P presbyopia; Mrx spectacle prescription;  CTL contact lenses; OD right eye; OS left eye; OU both eyes  XT exotropia; ET esotropia; PEK punctate epithelial keratitis; PEE punctate epithelial erosions; DES dry eye syndrome; MGD meibomian gland dysfunction; ATs artificial tears; PFAT's preservative free artificial tears; NSC nuclear sclerotic cataract; PSC posterior subcapsular cataract; ERM epi-retinal membrane; PVD posterior vitreous detachment; RD retinal detachment; DM diabetes mellitus; DR diabetic retinopathy; NPDR non-proliferative diabetic retinopathy; PDR proliferative diabetic retinopathy; CSME clinically significant macular edema; DME diabetic macular edema; dbh dot blot hemorrhages; CWS  cotton wool spot; POAG primary open angle glaucoma; C/D cup-to-disc ratio; HVF humphrey visual field; GVF goldmann visual field; OCT optical coherence tomography; IOP intraocular pressure; BRVO Branch retinal vein occlusion; CRVO central retinal vein occlusion; CRAO central retinal artery occlusion; BRAO branch retinal artery occlusion; RT retinal tear; SB scleral buckle; PPV pars plana vitrectomy; VH Vitreous hemorrhage; PRP panretinal laser photocoagulation; IVK intravitreal kenalog; VMT vitreomacular traction; MH Macular hole;  NVD neovascularization of the disc; NVE neovascularization elsewhere; AREDS age related eye disease study; ARMD age related macular degeneration; POAG primary open angle glaucoma; EBMD epithelial/anterior basement membrane dystrophy; ACIOL anterior chamber intraocular lens; IOL intraocular lens; PCIOL posterior chamber intraocular lens; Phaco/IOL phacoemulsification with intraocular lens placement; PRK photorefractive keratectomy; LASIK laser assisted in situ keratomileusis; HTN hypertension; DM diabetes mellitus; COPD chronic obstructive pulmonary disease "

## 2024-03-30 ENCOUNTER — Encounter (HOSPITAL_COMMUNITY)
Admission: RE | Admit: 2024-03-30 | Discharge: 2024-03-30 | Disposition: A | Discharge status: Home / Self Care | Source: Ambulatory Visit | Attending: Cardiology | Admitting: Cardiology

## 2024-03-30 VITALS — Wt 222.7 lb

## 2024-03-30 DIAGNOSIS — I5020 Unspecified systolic (congestive) heart failure: Secondary | ICD-10-CM | POA: Insufficient documentation

## 2024-03-30 NOTE — Progress Notes (Signed)
 Daily Session Note  Patient Details  Name: Jonathan Mcneil MRN: [id number] Date of Birth: 16-Jul-1955 Referring Provider:   [name] Pulmonary Rehab Walk Test from 01/26/2024 in [facility]  Referring Provider [name]    Encounter Date: 03/30/2024  Check In:  Session Check In - 03/30/24 1317       Check-In   Supervising physician immediately available to respond to emergencies [facility] MD immediately available    Physician(s) Rosabel Mose, NP    Location [facility]    Staff Present Ronal Levin, RN, Maud Moats, MS, ACSM-CEP, Exercise Physiologist;Pocahontas Cohenour Midge HECKLE, ACSM-CEP, Exercise Physiologist;Joseph Lennon, RN, Wetzel Sharps, RT    Virtual Visit No    Medication changes reported     No    Fall or balance concerns reported    No    Tobacco Cessation No Change    Warm-up and Cool-down Performed as group-led instruction    Resistance Training Performed Yes    VAD Patient? No    PAD/SET Patient? No      Pain Assessment   Currently in Pain? No/denies    Pain Score 0-No pain    Multiple Pain Sites No          Capillary Blood Glucose: No results found for this or any previous visit (from the past 24 hours).   Exercise Prescription Changes - 03/30/24 1300       Response to Exercise   Blood Pressure (Admit) 106/58    Blood Pressure (Exercise) 122/64    Blood Pressure (Exit) 110/64    Heart Rate (Admit) 75 bpm    Heart Rate (Exercise) 115 bpm    Heart Rate (Exit) 85 bpm    Oxygen Saturation (Admit) 99 %    Oxygen Saturation (Exercise) 96 %    Oxygen Saturation (Exit) 99 %    Rating of Perceived Exertion (Exercise) 9    Perceived Dyspnea (Exercise) 1    Duration Continue with 30 min of aerobic exercise without signs/symptoms of physical distress.    Intensity THRR unchanged      Progression   Progression Continue to progress workloads to maintain intensity without signs/symptoms of  physical distress.      Resistance Training   Weight blue bands    Reps 10-15    Time 10 Minutes      Treadmill   MPH 2    Grade 0.5    Minutes 15    METs 2.4      Recumbant Elliptical   Level 4    Minutes 15    METs 2          Tobacco Use History[1]  Goals Met:  Exercise tolerated well No report of concerns or symptoms today Strength training completed today  Goals Unmet:  Not Applicable  Comments: Service time is from 1305 to 1439.    Dr. Slater Staff is Medical Director for Pulmonary Rehab at [facility].     [1]  Social History Tobacco Use  Smoking Status Former  Smokeless Tobacco Never

## 2024-04-01 ENCOUNTER — Encounter (HOSPITAL_COMMUNITY)
Admission: RE | Admit: 2024-04-01 | Discharge: 2024-04-01 | Disposition: A | Discharge status: Home / Self Care | Source: Ambulatory Visit | Attending: Cardiology | Admitting: Cardiology

## 2024-04-01 DIAGNOSIS — I5020 Unspecified systolic (congestive) heart failure: Secondary | ICD-10-CM

## 2024-04-01 NOTE — Progress Notes (Signed)
 Daily Session Note  Patient Details  Name: Jonathan Mcneil MRN: [id number] Date of Birth: [birth year] Referring Provider:   [name] Pulmonary Rehab Walk Test from 01/26/2024 in [facility]  Referring Provider [name]    Encounter Date: 04/01/2024  Check In:  Session Check In - 04/01/24 1316       Check-In   Supervising physician immediately available to respond to emergencies [facility] MD immediately available    Physician(s) Damien Braver, NP    [facility]    Staff Present Ronal Levin, RN, BSN;Aveena Bari Midge BS, ACSM-CEP, Exercise Physiologist;Casey Claudene, RT    Virtual Visit No    Medication changes reported     No    Fall or balance concerns reported    No    Tobacco Cessation No Change    Warm-up and Cool-down Performed as group-led instruction    Resistance Training Performed Yes    VAD Patient? No    PAD/SET Patient? No      Pain Assessment   Currently in Pain? No/denies    Multiple Pain Sites No          Capillary Blood Glucose: No results found for this or any previous visit (from the past 24 hours).    Tobacco Use History[1]  Goals Met:  Exercise tolerated well No report of concerns or symptoms today Strength training completed today  Goals Unmet:  Not Applicable  Comments: Service time is from 1304 to 1436.    Dr. Slater Staff is Medical Director for Pulmonary Rehab at [facility].     [1]  Social History Tobacco Use  Smoking Status Former  Smokeless Tobacco Never

## 2024-04-06 ENCOUNTER — Encounter (HOSPITAL_COMMUNITY)
Admission: RE | Admit: 2024-04-06 | Discharge: 2024-04-06 | Disposition: A | Source: Ambulatory Visit | Attending: Cardiology

## 2024-04-06 DIAGNOSIS — I5020 Unspecified systolic (congestive) heart failure: Secondary | ICD-10-CM

## 2024-04-06 NOTE — Progress Notes (Signed)
 Daily Session Note  Patient Details  Name: Jonathan Mcneil MRN: [id number] Date of Birth: Dec 20, 1955 Referring Provider:   [name] Pulmonary Rehab Walk Test from 01/26/2024 in [facility]  Referring Provider [name]    Encounter Date: 04/06/2024  Check In:  Session Check In - 04/06/24 1348       Check-In   Supervising physician immediately available to respond to emergencies [facility] MD immediately available    Physician(s) Rosaline Skains, NP    Location [facility]    Staff Present Ronal Levin, RN, BSN;Randi Midge HECKLE, ACSM-CEP, Exercise Physiologist;Casey Claudene [place], RN, Maud Moats, MS, ACSM-CEP, Exercise Physiologist    Virtual Visit No    Medication changes reported     No    Fall or balance concerns reported    No    Tobacco Cessation No Change    Warm-up and Cool-down Performed as group-led instruction    Resistance Training Performed Yes    VAD Patient? No    PAD/SET Patient? No      Pain Assessment   Currently in Pain? No/denies    Multiple Pain Sites No          Capillary Blood Glucose: No results found for this or any previous visit (from the past 24 hours).    Tobacco Use History[1]  Goals Met:  Proper associated with RPD/PD & O2 Sat Exercise tolerated well No report of concerns or symptoms today Strength training completed today  Goals Unmet:  Not Applicable  Comments: Service time is from 1308 to 1440.    Dr. Slater Staff is Medical Director for Pulmonary Rehab at [facility].     [1]  Social History Tobacco Use  Smoking Status Former  Smokeless Tobacco Never

## 2024-04-07 ENCOUNTER — Encounter (INDEPENDENT_AMBULATORY_CARE_PROVIDER_SITE_OTHER): Payer: Self-pay | Admitting: Ophthalmology

## 2024-04-07 ENCOUNTER — Ambulatory Visit (INDEPENDENT_AMBULATORY_CARE_PROVIDER_SITE_OTHER): Admitting: Ophthalmology

## 2024-04-07 ENCOUNTER — Encounter (INDEPENDENT_AMBULATORY_CARE_PROVIDER_SITE_OTHER): Admitting: Ophthalmology

## 2024-04-07 DIAGNOSIS — Z961 Presence of intraocular lens: Secondary | ICD-10-CM | POA: Diagnosis not present

## 2024-04-07 DIAGNOSIS — H4312 Vitreous hemorrhage, left eye: Secondary | ICD-10-CM

## 2024-04-07 DIAGNOSIS — H35372 Puckering of macula, left eye: Secondary | ICD-10-CM | POA: Diagnosis not present

## 2024-04-07 DIAGNOSIS — H25812 Combined forms of age-related cataract, left eye: Secondary | ICD-10-CM

## 2024-04-07 DIAGNOSIS — H43812 Vitreous degeneration, left eye: Secondary | ICD-10-CM

## 2024-04-07 DIAGNOSIS — I10 Essential (primary) hypertension: Secondary | ICD-10-CM | POA: Diagnosis not present

## 2024-04-07 DIAGNOSIS — H4313 Vitreous hemorrhage, bilateral: Secondary | ICD-10-CM | POA: Diagnosis not present

## 2024-04-07 DIAGNOSIS — H40053 Ocular hypertension, bilateral: Secondary | ICD-10-CM

## 2024-04-07 DIAGNOSIS — E113513 Type 2 diabetes mellitus with proliferative diabetic retinopathy with macular edema, bilateral: Secondary | ICD-10-CM

## 2024-04-07 DIAGNOSIS — H4311 Vitreous hemorrhage, right eye: Secondary | ICD-10-CM

## 2024-04-07 DIAGNOSIS — H35033 Hypertensive retinopathy, bilateral: Secondary | ICD-10-CM

## 2024-04-07 MED ORDER — BEVACIZUMAB CHEMO INJECTION 1.25MG/0.05ML SYRINGE FOR KALEIDOSCOPE
1.2500 mg | INTRAVITREAL | Status: AC | PRN
  Administered 2024-04-07: 1.25 mg via INTRAVITREAL

## 2024-04-07 NOTE — Progress Notes (Signed)
 " [facility] - Clinic Note  04/07/2024     CHIEF COMPLAINT Patient presents for Retina Follow Up  HISTORY OF PRESENT ILLNESS: Jonathan Mcneil is a 69 y.o. male who presents to the clinic today for:   HPI     Retina Follow Up   Patient presents with  Diabetic Retinopathy.  In both eyes.  This started 8 weeks ago.  Duration of 8 weeks.  Since onset it is stable.  I, the attending physician,  performed the HPI with the patient and updated documentation appropriately.        Comments   8 week retina follow up PDR and IVA OD pt is reporting no vision changes noticed he denies any flashes or floaters pt is not currently checking blood sugar states he was told he did not have diabetes       Last edited by Valdemar Rogue, MD on 04/07/2024 11:13 PM.    Pt states a little bit of film in OU, not bad.    Referring physician: Verdia Lombard, MD [address] [place],  [place] [address]  HISTORICAL INFORMATION:   Selected notes from the MEDICAL RECORD NUMBER Referred by Dr. Geroge Daniels for heme OD   CURRENT MEDICATIONS: Current Outpatient Medications (Ophthalmic Drugs)  Medication Sig   dorzolamide -timolol  (COSOPT ) 2-0.5 % ophthalmic solution INSTILL 1 DROP INTO BOTH EYES TWICE A DAY   No current facility-administered medications for this visit. (Ophthalmic Drugs)   Current Outpatient Medications (Other)  Medication Sig   acetaminophen  (TYLENOL ) 500 MG tablet Take 1,000 mg by mouth every 6 (six) hours as needed.   esomeprazole (NEXIUM) 40 MG capsule Take 40 mg by mouth daily.   gabapentin  (NEURONTIN ) 300 MG capsule Take 300 mg by mouth at bedtime.   loperamide  (IMODIUM ) 2 MG capsule Take 1 capsule (2 mg total) by mouth 4 (four) times daily as needed for diarrhea or loose stools.   methocarbamol  (ROBAXIN ) 500 MG tablet Take 1 tablet (500 mg total) by mouth every 8 (eight) hours as needed for muscle spasms.   metoprolol  succinate (TOPROL -XL) 100 MG  24 hr tablet Take 1 tablet (100 mg total) by mouth daily.   sacubitril -valsartan  (ENTRESTO ) 24-26 MG Take 1 tablet by mouth 2 (two) times daily.   sertraline  (ZOLOFT ) 25 MG tablet Take 25 mg by mouth 3 (three) times a week. M W F   sucroferric oxyhydroxide (VELPHORO ) 500 MG chewable tablet Chew 500 mg by mouth 3 (three) times daily with meals.   traMADol  (ULTRAM ) 50 MG tablet Take 25 mg by mouth as needed. 3 x weekly after dialysis as needed for pain   No current facility-administered medications for this visit. (Other)   REVIEW OF SYSTEMS: ROS   Positive for: Gastrointestinal, Neurological, Genitourinary, Endocrine, Eyes Negative for: Constitutional, Skin, Musculoskeletal, HENT, Cardiovascular, Respiratory, Psychiatric, Allergic/Imm, Heme/Lymph Last edited by Resa Delon ORN, COT on 04/07/2024 12:36 PM.     ALLERGIES Allergies  Allergen Reactions   Gabapentin  Nausea Only   Ibuprofen Hives   Penicillins Itching and Other (See Comments)    Has patient had a PCN reaction causing immediate rash, facial/tongue/throat swelling, SOB or lightheadedness with hypotension: No Has patient had a PCN reaction causing severe rash involving mucus membranes or skin necrosis: No Has patient had a PCN reaction that required hospitalization No Has patient had a PCN reaction occurring within the last 10 years: Unknown If all of the above answers are NO, then may proceed with  Cephalosporin use.   Venlafaxine Other (See Comments)    insomnia   PAST MEDICAL HISTORY Past Medical History:  Diagnosis Date   Diabetes mellitus without complication (HCC)    Diabetic retinopathy (HCC)    ESRD (end stage renal disease) (HCC)    GERD (gastroesophageal reflux disease)    PMH   Hypertension    Hypertensive retinopathy    Low back pain    Lung disease    Metabolic acidosis    Neuromuscular disorder (HCC)    peripheral neuropathy   Pancreatitis    Schizophrenia (HCC)    does not take medications    Past Surgical History:  Procedure Laterality Date   A/V FISTULAGRAM Left 06/03/2018   Procedure: A/V FISTULAGRAM;  Surgeon: Gretta Lonni PARAS, MD;  Location: [facility];  Service: Cardiovascular;  Laterality: Left;   AV FISTULA PLACEMENT Left 02/11/2018   Procedure: INSERTION OF ARTERIOVENOUS (AV) FISTULA LEFT  ARM;  Surgeon: Sheree Penne Lonni, MD;  Location: [facility] OR;  Service: Vascular;  Laterality: Left;   BASCILIC VEIN TRANSPOSITION Left 04/17/2018   Procedure: BASILIC VEIN TRANSPOSITION SECOND STAGE;  Surgeon: Sheree Penne Lonni, MD;  Location: [facility] OR;  Service: Vascular;  Laterality: Left;   BIOPSY  10/20/2019   Procedure: BIOPSY;  Surgeon: Rosalie Kitchens, MD;  Location: [facility] ENDOSCOPY;  Service: Endoscopy;;   CATARACT EXTRACTION     COLONOSCOPY     DENTAL SURGERY     ESOPHAGOGASTRODUODENOSCOPY N/A 06/16/2021   Procedure: ESOPHAGOGASTRODUODENOSCOPY (EGD);  Surgeon: Dianna Specking, MD;  Location: [facility] ENDOSCOPY;  Service: Gastroenterology;  Laterality: N/A;   ESOPHAGOGASTRODUODENOSCOPY (EGD) WITH PROPOFOL  N/A 10/20/2019   Procedure: ESOPHAGOGASTRODUODENOSCOPY (EGD) WITH PROPOFOL ;  Surgeon: Rosalie Kitchens, MD;  Location: [facility] ENDOSCOPY;  Service: Endoscopy;  Laterality: N/A;   INSERTION OF DIALYSIS CATHETER Right 02/25/2018   Procedure: INSERTION OF DIALYSIS CATHETER;  Surgeon: Sheree Penne Lonni, MD;  Location: [facility] OR;  Service: Vascular;  Laterality: Right;   PERIPHERAL VASCULAR BALLOON ANGIOPLASTY  06/03/2018   Procedure: PERIPHERAL VASCULAR BALLOON ANGIOPLASTY;  Surgeon: Gretta Lonni PARAS, MD;  Location: [facility];  Service: Cardiovascular;;  left a/v fistula   FAMILY HISTORY Family History  Problem Relation Age of Onset   Kidney failure Mother    Diabetes Father    Blindness Brother    SOCIAL HISTORY Social History   Tobacco Use   Smoking status: Former   Smokeless tobacco: Never  [employer]   Vaping status: Never Used  Substance Use  Topics   Alcohol  use: No   Drug use: Not Currently    Types: Marijuana    Comment: Quit about 5-6 yrs ago       OPHTHALMIC EXAM:  Base Eye Exam     Visual Acuity (Snellen - Linear)       Right Left   Dist [place] 20/25 -2 20/25 -1   Dist ph [place] NI NI         Tonometry (Tonopen, 12:39 PM)       Right Left   Pressure 15 16         Pupils       Pupils Dark Light Shape React APD   Right PERRL 3 2 Round Brisk None   Left PERRL 3 2 Round Brisk None         Visual Fields       Left Right    Full Full         Extraocular Movement  Right Left    Full, Ortho Full, Ortho         Neuro/Psych     Oriented x3: Yes   Mood/Affect: Normal         Dilation     Both eyes: 2.5% Phenylephrine  @ 12:39 PM           Slit Lamp and Fundus Exam     Slit Lamp Exam       Right Left   Lids/Lashes Dermatochalasis - upper lid Dermatochalasis - upper lid   Conjunctiva/Sclera Melanosis Melanosis, temporal pinguecula   Cornea Mild arcus, well healed cataract wound arcus   Anterior Chamber deep and clear, no cell deep and clear   Iris Round and dilated, No NVI Round and dilated, No NVI   Lens PC IOL in good position, trace Posterior capsular opacification 3+ Nuclear sclerosis, 3+ Cortical cataract   Anterior Vitreous Vitreous syneresis, blood stained vit condensations cleared centrally, old white blood clots settled inferiorly -- improved Vitreous syneresis, old white blood clots settled inferiorly -- minimal, no heme centrally, Posterior vitreous detachment         Fundus Exam       Right Left   Disc Mild Pallor, Sharp rim, focal PPP/PPA Mild Pallor, Sharp rim   C/D Ratio 0.5 0.5   Macula Flat, Good foveal reflex, RPE mottling and clumping, trace cystic changes temporal macula -- stably improved, rare MA, +ERM Flat, good foveal reflex, ERM, RPE mottling and clumping, rare MA, no edema   Vessels attenuated, mild tortuosity attenuated, Tortuous   Periphery  Attached, 360 IRH/DBH - improved, pre-retinal heme settled inferiorly, 360 PRP with room for fill in Attached, scattered IRH/DBH greatest nasally, good 360 PRP changes with room for fill in, No RT/RD           IMAGING AND PROCEDURES  Imaging and Procedures for 04/07/2024  OCT, Retina - OU - Both Eyes       Right Eye Quality was good. Central Foveal Thickness: 280. Progression has been stable. Findings include normal foveal contour, no IRF, no SRF, intraretinal hyper-reflective material, epiretinal membrane (Stable improvement in vitreous opacities, stable improvement in cystic changes temporal macula, mild ERM).   Left Eye Quality was good. Central Foveal Thickness: 317. Progression has been stable. Findings include no SRF, abnormal foveal contour, intraretinal hyper-reflective material, epiretinal membrane, intraretinal fluid, macular pucker (ERM with blunted foveal contour, trace cystic changes temporal macula ).   Notes *Images captured and stored on drive  Diagnosis / Impression:  OD: stable improvement in vitreous opacities, stable improvement in cystic changes temporal macula, mild ERM OS: ERM with blunted foveal contour, trace cystic changes temporal macula   Clinical management:  See below  Abbreviations: NFP - Normal foveal profile. CME - cystoid macular edema. PED - pigment epithelial detachment. IRF - intraretinal fluid. SRF - subretinal fluid. EZ - ellipsoid zone. ERM - epiretinal membrane. ORA - outer retinal atrophy. ORT - outer retinal tubulation. SRHM - subretinal hyper-reflective material. IRHM - intraretinal hyper-reflective material      Intravitreal Injection, Pharmacologic Agent - OD - Right Eye       Time Out 04/07/2024. 1:09 PM. Confirmed correct patient, procedure, site, and patient consented.   Anesthesia Topical anesthesia was used. Anesthetic medications included Lidocaine  2%, Proparacaine 0.5%.   Procedure Preparation included 5% betadine to  ocular surface, eyelid speculum. A supplied (32g) needle was used.   Injection: 1.25 mg Bevacizumab  1.25mg /0.55ml   Route: Intravitreal, Site: Right Eye  NDC: 49757-939-98, Lot: [id number]$$[id number]$ , Expiration date: 04/17/2024   Post-op Post injection exam found visual acuity of at least counting fingers. The patient tolerated the procedure well. There were no complications. The patient received written and verbal post procedure care education. Post injection medications were not given.              ASSESSMENT/PLAN:   ICD-10-CM   1. Proliferative diabetic retinopathy of both eyes with macular edema associated with type 2 diabetes mellitus (HCC)  E11.3513 OCT, Retina - OU - Both Eyes    Intravitreal Injection, Pharmacologic Agent - OD - Right Eye    Bevacizumab  (AVASTIN ) SOLN 1.25 mg    2. Vitreous hemorrhage of left eye (HCC)  H43.12     3. Vitreous hemorrhage, right eye (HCC)  H43.11     4. Posterior vitreous detachment of left eye  H43.812     5. Essential hypertension  I10     6. Hypertensive retinopathy of both eyes  H35.033     7. Epiretinal membrane (ERM) of left eye  H35.372     8. Combined forms of age-related cataract of left eye  H25.812     9. Pseudophakia  Z96.1     10. Bilateral ocular hypertension  H40.053      1-3. Proliferative diabetic retinopathy w/ recurrent VH OU (OD > OS)  **presented acutely on 10.22.25 for new VH OD -- onset Saturday 10.18.25  - A1c: 6.2 (06.23.25), 5.9 (03.13.24) - s/p IVA OD #1 (12.08.21), #2 (01.11.22), #3 (02.09.22), #4 (11.04.22), #5 (12.02.22), #6 (12.30.22), #7 (01.27.23), #8 (01.05.24), #9 (02.02.24), #10 (08.28.24), #11 (10.09.24), #12 (11.20.24), #13 (04.02.25), #14 (08.01.25), #15 (10.22.25), #16 (11.19.25) - s/p IVA OS #1 (12.10.21), #2 (01.11.22), #3 (02.09.22), #4 (09.09.22), #5 (10.07.22), #6 (11.04.22), #7 (12.02.22), #8 (12.30.22) - FA (12.08.21) shows late-leaking MA, vascular nonperfusion, +NV OU (nasal midzone) - s/p  PRP OD (01.26.22) - s/p PRP OS (03.09.22) - repeat FA 6.6.22 shows interval improvement in NVE/leakage OU s/p PRP OU-- just minimal leakage remains - repeat FA 10.09.24 shows OD: NV stably regressed, mild leaking MA; OS: Interval improvement in focal NVE IN periphery -- no leakage; mild perifoveal leakage -- both improved from prior - BCVA OD 20/25 stable - OCT shows OD: Stable improvement in vitreous opacities, stable improvement in cystic changes temporal macula, mild ERM; OS: ERM with blunted foveal contour, trace cystic changes temporal macula at 8 weeks - recommend IVA OD #17 today, 01.14.26 -- maintenance with f/u ext to 10-12 weeks - pt wishes to proceed with injection  - RBA of procedure discussed, questions answered - IVA informed consent obtained and signed, 10.22.25 (OD) - see procedure note  - f/u 10-12 weeks, DFE, OCT, possible injections  4. Hemorrhagic PVD OS  - Onset mid-August 2022 w/ new onset floaters, no photopsias  - s/p IVA OS #4 (09.09.22), #5 (10.07.22), #6 (11.04.22), #7 (12.02.22), #8 (12.30.22) - today, VA OD 20/25 from 20/20 - pt states they have been holding heparin  at dialysis - exam shows interval improvement in vitreous heme -- clearing and settling inferiorly - pt reports previously receiving heparin  during dialysis (T, Th, Sat) -- may have contributed to interval worsening   - Discussed findings and prognosis  - No RT or RD on 360 peripheral exam  - Reviewed s/s of RT/RD  - Strict return precautions for any such RT/RD signs/symptoms - VH precautions reviewed -- minimize activities, keep head elevated, hold heparin  if able, avoid ASA/NSAIDS  5,6. Hypertensive retinopathy  OU - discussed importance of tight BP control - monitor  7. Epiretinal membrane, left eye  - mild ERM - BCVA 20/25 from 20/20 - asymptomatic, no metamorphopsia - no indication for surgery at this time - monitor for now  8. Mixed Cataract OS - The symptoms of cataract, surgical  options, and treatments and risks were discussed with patient - discussed diagnosis and progression - left eye surgery scheduled for Friday, November 22  9. Pseudophakia OD  - s/p CE/IOL (Dr. Octavia)  - IOL in good position, doing well  - monitor  10. Ocular Hypertension OU  - IOP today: 15,16  - cont Cosopt  bid OU--pt reports not taking as of 10.22.25    Ophthalmic Meds Ordered this visit:  Meds ordered this encounter  Medications   Bevacizumab  (AVASTIN ) SOLN 1.25 mg     Return for 10-12 weeks PDR OU, DFE, OCT, likely IVA OD.  There are no Patient Instructions on file for this visit.  This document serves as a record of services personally performed by Redell JUDITHANN Hans, MD, PhD. It was created on their behalf by Almetta Pesa, an ophthalmic technician. The creation of this record is the provider's dictation and/or activities during the visit.    Electronically signed by: Almetta Pesa, OA, 04/07/24  11:18 PM  Redell JUDITHANN Hans, M.D., Ph.D. Diseases & Surgery of the Retina and Vitreous [facility]  I have reviewed the above documentation for accuracy and completeness, and I agree with the above. Redell JUDITHANN Hans, M.D., Ph.D. 04/07/24 11:18 PM   Abbreviations: M myopia (nearsighted); A astigmatism; H hyperopia (farsighted); P presbyopia; Mrx spectacle prescription;  CTL contact lenses; OD right eye; OS left eye; OU both eyes  XT exotropia; ET esotropia; PEK punctate epithelial keratitis; PEE punctate epithelial erosions; DES dry eye syndrome; MGD meibomian gland dysfunction; ATs artificial tears; PFAT's preservative free artificial tears; NSC nuclear sclerotic cataract; PSC posterior subcapsular cataract; ERM epi-retinal membrane; PVD posterior vitreous detachment; RD retinal detachment; DM diabetes mellitus; DR diabetic retinopathy; NPDR non-proliferative diabetic retinopathy; PDR proliferative diabetic retinopathy; CSME clinically significant macular edema;  DME diabetic macular edema; dbh dot blot hemorrhages; CWS cotton wool spot; POAG primary open angle glaucoma; C/D cup-to-disc ratio; HVF humphrey visual field; GVF goldmann visual field; OCT optical coherence tomography; IOP intraocular pressure; BRVO Branch retinal vein occlusion; CRVO central retinal vein occlusion; CRAO central retinal artery occlusion; BRAO branch retinal artery occlusion; RT retinal tear; SB scleral buckle; PPV pars plana vitrectomy; VH Vitreous hemorrhage; PRP panretinal laser photocoagulation; IVK intravitreal kenalog; VMT vitreomacular traction; MH Macular hole;  NVD neovascularization of the disc; NVE neovascularization elsewhere; AREDS age related eye disease study; ARMD age related macular degeneration; POAG primary open angle glaucoma; EBMD epithelial/anterior basement membrane dystrophy; ACIOL anterior chamber intraocular lens; IOL intraocular lens; PCIOL posterior chamber intraocular lens; Phaco/IOL phacoemulsification with intraocular lens placement; PRK photorefractive keratectomy; LASIK laser assisted in situ keratomileusis; HTN hypertension; DM diabetes mellitus; COPD chronic obstructive pulmonary disease "

## 2024-04-08 ENCOUNTER — Encounter (HOSPITAL_COMMUNITY)
Admission: RE | Admit: 2024-04-08 | Discharge: 2024-04-08 | Disposition: A | Source: Ambulatory Visit | Attending: Cardiology

## 2024-04-08 DIAGNOSIS — I5020 Unspecified systolic (congestive) heart failure: Secondary | ICD-10-CM

## 2024-04-08 NOTE — Progress Notes (Signed)
 Daily Session Note  Patient Details  Name: Jonathan Mcneil MRN: [id number] Date of Birth: 12/06/1955 Referring Provider:   Conrad Mcneil Pulmonary Rehab Walk Test from 01/26/2024 in [facility]  Referring Provider Jonathan Mcneil    Encounter Date: 04/08/2024  Check In:  Session Check In - 04/08/24 1323       Check-In   Supervising physician immediately available to respond to emergencies [facility] MD immediately available    Physician(s) Jonathan Louis, NP    Location [facility]    Staff Present Jonathan Levin, RN, BSN;Jonathan Mcneil BS, ACSM-CEP, Exercise Physiologist;Jonathan Claudene Neita Moats, MS, ACSM-CEP, Exercise Physiologist    Virtual Visit No    Medication changes reported     No    Fall or balance concerns reported    No    Tobacco Cessation No Change    Warm-up and Cool-down Performed as group-led instruction    Resistance Training Performed Yes    VAD Patient? No    PAD/SET Patient? No      Pain Assessment   Currently in Pain? No/denies    Pain Score 0-No pain    Multiple Pain Sites No          Capillary Blood Glucose: No results found for this or any previous visit (from the past 24 hours).    Tobacco Use History[1]  Goals Met:  Proper associated with RPD/PD & O2 Sat Exercise tolerated well No report of concerns or symptoms today Strength training completed today  Goals Unmet:  Not Applicable  Comments: Service time is from 1303 to 1443.    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     [1]  Social History Tobacco Use  Smoking Status Former  Smokeless Tobacco Never

## 2024-04-13 ENCOUNTER — Encounter (HOSPITAL_COMMUNITY)
Admission: RE | Admit: 2024-04-13 | Discharge: 2024-04-13 | Disposition: A | Source: Ambulatory Visit | Attending: Cardiology

## 2024-04-13 VITALS — Wt 222.2 lb

## 2024-04-13 DIAGNOSIS — I5020 Unspecified systolic (congestive) heart failure: Secondary | ICD-10-CM

## 2024-04-13 NOTE — Progress Notes (Signed)
 Home Exercise Prescription I have reviewed a Home Exercise Prescription with Jonathan Mcneil. He is not currently exercising but plans to start going to the [facility] and using the same machines as in [place]. We discussed exercising on the treadmill for 15 min and the recumbent elliptical for 15 min. He agreed and is motivated. We discussed adding one day a week right now on his nonrehab days. The patient stated that their goals were to travel, stay in shape, get a kidney transplant, and keep serving [employer]. We reviewed exercise guidelines, target heart rate during exercise, RPE Scale, weather conditions, endpoints for exercise, warmup and cool down. The patient is encouraged to come to me with any questions. I will continue to follow up with the patient to assist them with progression and safety.  Spent 15 min discussing home exercise plan and goals.  Clarissia Mckeen [place], [place], ACSM-CEP 04/13/2024 2:56 PM

## 2024-04-14 NOTE — Progress Notes (Signed)
 Pulmonary Individual Treatment Plan  Patient Details  Name: Jonathan Mcneil MRN: [id number] Date of Birth: 20-Dec-1955 Referring Provider:   [name] Pulmonary Rehab Walk Test from 01/26/2024 in [facility]  Referring Provider [name]    Initial Encounter Date:  Flowsheet Row Pulmonary Rehab Walk Test from 01/26/2024 in [facility]  Date 01/26/24    Visit Diagnosis: HFrEF (heart failure with reduced ejection fraction) (HCC)  Patient's Home Medications on Admission:  Current Medications[1]  Past Medical History: Past Medical History:  Diagnosis Date   Diabetes mellitus without complication (HCC)    Diabetic retinopathy (HCC)    ESRD (end stage renal disease) (HCC)    GERD (gastroesophageal reflux disease)    PMH   Hypertension    Hypertensive retinopathy    Low back pain    Lung disease    Metabolic acidosis    Neuromuscular disorder (HCC)    peripheral neuropathy   Pancreatitis    Schizophrenia (HCC)    does not take medications    Tobacco Use: Tobacco Use History[2]  Labs: Review Flowsheet  More data exists      Latest Ref Rng & Units 03/18/2021 03/19/2021 04/11/2021 06/15/2021 01/14/2024  Labs for ITP Cardiac and Pulmonary Rehab  Hemoglobin A1c 4.8 - 5.6 % - 5.3  - - -  Bicarbonate 20.0 - 28.0 mmol/L 25.1  - - 25.7  -  TCO2 22 - 32 mmol/L 26  26  - 32  27  22   O2 Saturation % 75.0  - - 98  -    Details       Multiple values from one day are sorted in reverse-chronological order         Capillary Blood Glucose: Lab Results  Component Value Date   GLUCAP 148 (H) 08/13/2023   GLUCAP 102 (H) 10/17/2022   GLUCAP 157 (H) 06/15/2021   GLUCAP 122 (H) 12/07/2020   GLUCAP 99 12/07/2020     Pulmonary Assessment Scores:  Pulmonary Assessment Scores     Row Name 01/26/24 1158         ADL UCSD   ADL Phase Entry     SOB Score  total 56       CAT Score   CAT Score 15       mMRC Score   mMRC Score 2       UCSD: Self-administered rating of dyspnea associated with activities of daily living (ADLs) 6-point scale (0 = not at all to 5 = maximal or unable to do because of breathlessness)  Scoring Scores range from 0 to 120.  Minimally important difference is 5 units  CAT: CAT can identify the health impairment of COPD patients and is better correlated with disease progression.  CAT has a scoring range of zero to 40. The CAT score is classified into four groups of low (less than 10), medium (10 - 20), high (21-30) and very high (31-40) based on the impact level of disease on health status. A CAT score over 10 suggests significant symptoms.  A worsening CAT score could be explained by an exacerbation, poor medication adherence, poor inhaler technique, or progression of COPD or comorbid conditions.  CAT MCID is 2 points  mMRC: mMRC (Modified Medical Research Council) Dyspnea Scale is used to assess the degree of baseline functional disability in patients of respiratory disease due to dyspnea. No minimal important difference  is established. A decrease in score of 1 point or greater is considered a positive change.   Pulmonary Function Assessment:  Pulmonary Function Assessment - 01/26/24 1158       Breath   Bilateral Breath Sounds Clear    Shortness of Breath Yes;Limiting activity          Exercise Target Goals: Exercise Program Goal: Individual exercise prescription set using results from initial 6 min walk test and THRR while considering  patients activity barriers and safety.   Exercise Prescription Goal: Initial exercise prescription builds to 30-45 minutes a day of aerobic activity, 2-3 days per week.  Home exercise guidelines will be given to patient during program as part of exercise prescription that the participant will acknowledge.  Activity Barriers & Risk Stratification:  Activity Barriers &  Cardiac Risk Stratification - 01/26/24 1156       Activity Barriers & Cardiac Risk Stratification   Activity Barriers Deconditioning;Muscular Weakness;Shortness of Breath;Other (comment)    Comments neuropathy B hands          6 Minute Walk:  6 Minute Walk     Row Name 01/26/24 1140         6 Minute Walk   Phase Initial     Distance 1242 feet     Walk Time 6 minutes     # of Rest Breaks 0     MPH 2.35     METS 2.95     RPE 9     Perceived Dyspnea  1     VO2 Peak 10.34     Symptoms No     Resting HR 87 bpm     Resting BP 118/64     Resting Oxygen Saturation  100 %     Exercise Oxygen Saturation  during 6 min walk 91 %     Max Ex. HR 114 bpm     Max Ex. BP 142/70     2 Minute Post BP 132/74       Interval HR   1 Minute HR 97     2 Minute HR 95     3 Minute HR 104     4 Minute HR 114     5 Minute HR 108     6 Minute HR 112     2 Minute Post HR 92     Interval Heart Rate? Yes       Interval Oxygen   Interval Oxygen? Yes     Baseline Oxygen Saturation % 100 %     1 Minute Oxygen Saturation % 99 %     1 Minute Liters of Oxygen 0 L     2 Minute Oxygen Saturation % 98 %     2 Minute Liters of Oxygen 0 L     3 Minute Oxygen Saturation % 94 %     3 Minute Liters of Oxygen 0 L     4 Minute Oxygen Saturation % 91 %     4 Minute Liters of Oxygen 0 L     5 Minute Oxygen Saturation % 96 %     5 Minute Liters of Oxygen 0 L     6 Minute Oxygen Saturation % 93 %     6 Minute Liters of Oxygen 0 L     2 Minute Post Oxygen Saturation % 99 %     2 Minute Post Liters of Oxygen 0 L        Oxygen Initial Assessment:  Oxygen Initial Assessment - 01/26/24 1158       Home Oxygen   Home Oxygen Device None    Sleep Oxygen Prescription None    Home Exercise Oxygen Prescription None    Home Resting Oxygen Prescription None      Initial 6 min Walk   Oxygen Used None      Program Oxygen Prescription   Program Oxygen Prescription None      Intervention   Short Term  Goals To learn and understand importance of maintaining oxygen saturations>88%;To learn and demonstrate proper pursed lip breathing techniques or other breathing techniques. ;To learn and understand importance of monitoring SPO2 with pulse oximeter and demonstrate accurate use of the pulse oximeter.    Long  Term Goals Maintenance of O2 saturations>88%;Exhibits proper breathing techniques, such as pursed lip breathing or other method taught during program session;Verbalizes importance of monitoring SPO2 with pulse oximeter and return demonstration          Oxygen Re-Evaluation:  Oxygen Re-Evaluation     Row Name 02/12/24 0851 03/12/24 1422 04/09/24 0743         Program Oxygen Prescription   Program Oxygen Prescription None None None       Home Oxygen   Home Oxygen Device None None None     Sleep Oxygen Prescription None None None     Home Exercise Oxygen Prescription None None None     Home Resting Oxygen Prescription None None None       Goals/Expected Outcomes   Short Term Goals To learn and understand importance of maintaining oxygen saturations>88%;To learn and demonstrate proper pursed lip breathing techniques or other breathing techniques. ;To learn and understand importance of monitoring SPO2 with pulse oximeter and demonstrate accurate use of the pulse oximeter. To learn and understand importance of maintaining oxygen saturations>88%;To learn and demonstrate proper pursed lip breathing techniques or other breathing techniques. ;To learn and understand importance of monitoring SPO2 with pulse oximeter and demonstrate accurate use of the pulse oximeter. To learn and understand importance of maintaining oxygen saturations>88%;To learn and demonstrate proper pursed lip breathing techniques or other breathing techniques. ;To learn and understand importance of monitoring SPO2 with pulse oximeter and demonstrate accurate use of the pulse oximeter.     Long  Term Goals Maintenance of O2  saturations>88%;Exhibits proper breathing techniques, such as pursed lip breathing or other method taught during program session;Verbalizes importance of monitoring SPO2 with pulse oximeter and return demonstration Maintenance of O2 saturations>88%;Exhibits proper breathing techniques, such as pursed lip breathing or other method taught during program session;Verbalizes importance of monitoring SPO2 with pulse oximeter and return demonstration Maintenance of O2 saturations>88%;Exhibits proper breathing techniques, such as pursed lip breathing or other method taught during program session;Verbalizes importance of monitoring SPO2 with pulse oximeter and return demonstration     Goals/Expected Outcomes Compliance and understanding of oxygen saturation monitoring and breathing techniques to decrease shortness of breath. Compliance and understanding of oxygen saturation monitoring and breathing techniques to decrease shortness of breath. Compliance and understanding of oxygen saturation monitoring and breathing techniques to decrease shortness of breath.        Oxygen Discharge (Final Oxygen Re-Evaluation):  Oxygen Re-Evaluation - 04/09/24 0743       Program Oxygen Prescription   Program Oxygen Prescription None      Home Oxygen   Home Oxygen Device None    Sleep Oxygen Prescription None    Home Exercise Oxygen Prescription None    Home Resting Oxygen Prescription  None      Goals/Expected Outcomes   Short Term Goals To learn and understand importance of maintaining oxygen saturations>88%;To learn and demonstrate proper pursed lip breathing techniques or other breathing techniques. ;To learn and understand importance of monitoring SPO2 with pulse oximeter and demonstrate accurate use of the pulse oximeter.    Long  Term Goals Maintenance of O2 saturations>88%;Exhibits proper breathing techniques, such as pursed lip breathing or other method taught during program session;Verbalizes importance of  monitoring SPO2 with pulse oximeter and return demonstration    Goals/Expected Outcomes Compliance and understanding of oxygen saturation monitoring and breathing techniques to decrease shortness of breath.          Initial Exercise Prescription:  Initial Exercise Prescription - 01/26/24 1100       Date of Initial Exercise RX and Referring Provider   Date 01/26/24    Referring Provider [name]    Expected Discharge Date 05/06/24      Treadmill   MPH 1.6    Grade 0    Minutes 15    METs 1.8      Recumbant Elliptical   Level 2    RPM 70    Watts 80    Minutes 15    METs 2      Prescription Details   Frequency (times per week) 2    Duration Progress to 30 minutes of continuous aerobic without signs/symptoms of physical distress      Intensity   THRR 40-80% of Max Heartrate 61-122    Ratings of Perceived Exertion 11-13    Perceived Dyspnea 0-4      Progression   Progression Continue to progress workloads to maintain intensity without signs/symptoms of physical distress.      Resistance Training   Training Prescription Yes    Weight blue bands    Reps 10-15          Perform Capillary Blood Glucose checks as needed.  Exercise Prescription Changes:   Exercise Prescription Changes     Row Name 02/17/24 1500 03/02/24 1500 03/09/24 1510 03/30/24 1300 04/13/24 1400     Response to Exercise   Blood Pressure (Admit) 118/74 122/64 122/60 106/58 112/66   Blood Pressure (Exercise) 120/60 130/70 -- 122/64 120/72   Blood Pressure (Exit) 106/64 114/60 128/72 110/64 108/54   Heart Rate (Admit) 74 bpm 79 bpm 81 bpm 75 bpm 75 bpm   Heart Rate (Exercise) 101 bpm 102 bpm 97 bpm 115 bpm 95 bpm   Heart Rate (Exit) 92 bpm 88 bpm 84 bpm 85 bpm 80 bpm   Oxygen Saturation (Admit) 98 % 100 % 100 % 99 % 100 %   Oxygen Saturation (Exercise) 97 % 96 % 95 % 96 % 93 %   Oxygen Saturation (Exit) 100 % 100 % 99 % 99 % 100 %   Rating of Perceived Exertion (Exercise) 11 12 11 9 9     Perceived Dyspnea (Exercise) 0 0 0 1 1   Duration Continue with 30 min of aerobic exercise without signs/symptoms of physical distress. Continue with 30 min of aerobic exercise without signs/symptoms of physical distress. Continue with 30 min of aerobic exercise without signs/symptoms of physical distress. Continue with 30 min of aerobic exercise without signs/symptoms of physical distress. Continue with 30 min of aerobic exercise without signs/symptoms of physical distress.   Intensity THRR unchanged THRR unchanged THRR unchanged THRR unchanged THRR unchanged     Progression   Progression Continue to progress workloads to maintain intensity  without signs/symptoms of physical distress. Continue to progress workloads to maintain intensity without signs/symptoms of physical distress. Continue to progress workloads to maintain intensity without signs/symptoms of physical distress. Continue to progress workloads to maintain intensity without signs/symptoms of physical distress. Continue to progress workloads to maintain intensity without signs/symptoms of physical distress.     Resistance Training   Training Prescription Yes Yes -- -- --   Weight blue bands blue bands blue bands blue bands blue bands   Reps 10-15 10-15 10-15 10-15 10-15   Time 10 Minutes 10 Minutes 10 Minutes 10 Minutes 10 Minutes     Treadmill   MPH 2 2 1.7 2 2    Grade 0 0 0 0.5 1   Minutes 15 15 15 15 15    METs 2.4 2.4 2.2 2.4 2.6     Recumbant Elliptical   Level 3 3 3 4 5    RPM 68 -- -- -- --   Watts 68 -- -- -- --   Minutes 15 15 15 15 15    METs 2.6 2.6 2.3 2 1.3     Home Exercise Plan   Plans to continue exercise at -- -- -- -- [employer] (comment)  to [employer] -- -- -- -- Add 1 additional day to program exercise sessions.   Initial Home Exercises Provided -- -- -- -- 04/13/24      Exercise Comments:   Exercise Comments     Row Name 04/13/24 1453           Exercise Comments Discussed with pt  home exercise plan. He is not currently exercising but plans to start going to the [facility] and using the same machines as in [place]. We discussed exercising on the treadmill for 15 min and the recumbent elliptical for 15 min. He agreed and is motivated. We discussed adding one day a week right now on his nonrehab days.          Exercise Goals and Review:   Exercise Goals     Row Name 01/26/24 1157             Exercise Goals   Increase Physical Activity Yes       Intervention Provide advice, education, support and counseling about physical activity/exercise needs.;Develop an individualized exercise prescription for aerobic and resistive training based on initial evaluation findings, risk stratification, comorbidities and participant's personal goals.       Expected Outcomes Short Term: Attend rehab on a regular basis to increase amount of physical activity.;Long Term: Exercising regularly at least 3-5 days a week.;Long Term: Add in home exercise to make exercise part of routine and to increase amount of physical activity.       Increase Strength and Stamina Yes       Intervention Provide advice, education, support and counseling about physical activity/exercise needs.;Develop an individualized exercise prescription for aerobic and resistive training based on initial evaluation findings, risk stratification, comorbidities and participant's personal goals.       Expected Outcomes Short Term: Increase workloads from initial exercise prescription for resistance, speed, and METs.;Short Term: Perform resistance training exercises routinely during rehab and add in resistance training at home;Long Term: Improve cardiorespiratory fitness, muscular endurance and strength as measured by increased METs and functional capacity ( )       Able to understand and use rate of perceived exertion (RPE) scale Yes       Intervention Provide education and explanation on how to use RPE scale  Expected Outcomes Short  Term: Able to use RPE daily in rehab to express subjective intensity level;Long Term:  Able to use RPE to guide intensity level when exercising independently       Able to understand and use Dyspnea scale Yes       Intervention Provide education and explanation on how to use Dyspnea scale       Expected Outcomes Long Term: Able to use Dyspnea scale to guide intensity level when exercising independently;Short Term: Able to use Dyspnea scale daily in rehab to express subjective sense of shortness of breath during exertion       Knowledge and understanding of Target Heart Rate Range (THRR) Yes       Intervention Provide education and explanation of THRR including how the numbers were predicted and where they are located for reference       Expected Outcomes Short Term: Able to state/look up THRR;Short Term: Able to use daily as guideline for intensity in rehab;Long Term: Able to use THRR to govern intensity when exercising independently       Understanding of Exercise Prescription Yes       Intervention Provide education, explanation, and written materials on patient's individual exercise prescription       Expected Outcomes Short Term: Able to explain program exercise prescription;Long Term: Able to explain home exercise prescription to exercise independently          Exercise Goals Re-Evaluation :  Exercise Goals Re-Evaluation     Row Name 02/12/24 0845 03/12/24 1421 04/09/24 0741         Exercise Goal Re-Evaluation   Exercise Goals Review Increase Physical Activity;Able to understand and use Dyspnea scale;Understanding of Exercise Prescription;Increase Strength and Stamina;Knowledge and understanding of Target Heart Rate Range (THRR);Able to understand and use rate of perceived exertion (RPE) scale Increase Physical Activity;Able to understand and use Dyspnea scale;Understanding of Exercise Prescription;Increase Strength and Stamina;Knowledge and understanding of Target Heart Rate Range  (THRR);Able to understand and use rate of perceived exertion (RPE) scale Increase Physical Activity;Able to understand and use Dyspnea scale;Understanding of Exercise Prescription;Increase Strength and Stamina;Knowledge and understanding of Target Heart Rate Range (THRR);Able to understand and use rate of perceived exertion (RPE) scale     Comments Jeryn has completed 2 exercise sessions. He is walking on the treadmill for 15 min, up to 2 mph, 0 incline, METs 2.4. He then is exercising on the recumbent elliptical for 15 min, level 2, METs 2.4. Isiaha is tolerating well. He performs warm up and cool down standing, using blue bands, 5.8 lbs. Will progress as tolerated. Hermann has completed 9 exercise sessions. He is walking on the treadmill for 15 min, up to 1.7-2 mph, 0 incline, METs 2.4 max. He then is exercising on the recumbent elliptical for 15 min, level 3, METs 2.4. Leroi comes to [place] after dialysis therefore sometimes has days that he is more fatigued. He performs warm up and cool down standing, using blue bands, 5.8 lbs. Will progress as tolerated. Conlan has completed 14 exercise sessions, missing 2. He is walking on the treadmill for 15 min, up to 2 mph, 0 incline, METs 2.4. He then is exercising on the recumbent elliptical for 15 min, level 5, METs 2.4. Clydell comes to [place] after dialysis therefore sometimes has days that he is more fatigued. He is struggling to increase his speed or METs He performs warm up and cool down standing, using blue bands, 5.8 lbs. Will progress as tolerated.  Expected Outcomes Through exercise at rehab and home, the patient will decrease shortness of breath with daily activities and feel confident in carrying out an exercise regimen at home. Through exercise at rehab and home, the patient will decrease shortness of breath with daily activities and feel confident in carrying out an exercise regimen at home. Through exercise at rehab and home, the patient will decrease shortness of  breath with daily activities and feel confident in carrying out an exercise regimen at home.        Discharge Exercise Prescription (Final Exercise Prescription Changes):  Exercise Prescription Changes - 04/13/24 1400       Response to Exercise   Blood Pressure (Admit) 112/66    Blood Pressure (Exercise) 120/72    Blood Pressure (Exit) 108/54    Heart Rate (Admit) 75 bpm    Heart Rate (Exercise) 95 bpm    Heart Rate (Exit) 80 bpm    Oxygen Saturation (Admit) 100 %    Oxygen Saturation (Exercise) 93 %    Oxygen Saturation (Exit) 100 %    Rating of Perceived Exertion (Exercise) 9    Perceived Dyspnea (Exercise) 1    Duration Continue with 30 min of aerobic exercise without signs/symptoms of physical distress.    Intensity THRR unchanged      Progression   Progression Continue to progress workloads to maintain intensity without signs/symptoms of physical distress.      Resistance Training   Weight blue bands    Reps 10-15    Time 10 Minutes      Treadmill   MPH 2    Grade 1    Minutes 15    METs 2.6      Recumbant Elliptical   Level 5    Minutes 15    METs 1.3      Home Exercise Plan   Plans to continue exercise at [employer] (comment)   to [employer]   Frequency Add 1 additional day to program exercise sessions.    Initial Home Exercises Provided 04/13/24          Nutrition:  Target Goals: Understanding of nutrition guidelines, daily intake of sodium 1500mg , cholesterol 200mg , calories 30% from fat and 7% or less from saturated fats, daily to have 5 or more servings of fruits and vegetables.  Biometrics:  Pre Biometrics - 01/26/24 1157       Pre Biometrics   Grip Strength 19 kg           Nutrition Therapy Plan and Nutrition Goals:  Nutrition Therapy & Goals - 02/05/24 1421       Nutrition Therapy   Diet Renal Diet      Personal Nutrition Goals   Nutrition Goal Patient to limit sodium intake to <2300 mg per day.    Comments Patient with  medical history significant for ESRD on HD, heart failure with reduced EF, HTN, DM2. Reports dry weight of 93 kg. Currently working with dietitian at dialysis clinic; endorses limiting foods such as potatoes, tomatoes likely due to potassium content and following 32 oz fluid restriction. Also reports working on reducing sodium intake; however voices some uncertainty regarding sources of high sodium foods. RD reviewed sources of sodium, label reading and ways to reduce sodium intake.      Intervention Plan   Intervention Prescribe, educate and counsel regarding individualized specific dietary modifications aiming towards targeted core components such as weight, hypertension, lipid management, diabetes, heart failure and other comorbidities.;Nutrition handout(s) given to patient.  Handout: Chronic Kidney Disease Stage 3-5 Nutrition Therapy   Expected Outcomes Short Term Goal: Understand basic principles of dietary content, such as calories, fat, sodium, cholesterol and nutrients.;Long Term Goal: Adherence to prescribed nutrition plan.          Nutrition Assessments:  MEDIFICTS Score Key: >=70 Need to make dietary changes  40-70 Heart Healthy Diet <= 40 Therapeutic Level Cholesterol Diet   Picture Your Plate Scores: <59 Unhealthy dietary pattern with much room for improvement. 41-50 Dietary pattern unlikely to meet recommendations for good health and room for improvement. 51-60 More healthful dietary pattern, with some room for improvement.  >60 Healthy dietary pattern, although there may be some specific behaviors that could be improved.    Nutrition Goals Re-Evaluation:  Nutrition Goals Re-Evaluation     Row Name 03/23/24 1523             Goals   Current Weight 222 lb (100.7 kg)       Nutrition Goal Patient to limit sodium intake to <2300 mg per day.       Comment 7 lb wt gain over past month       Expected Outcome Goals in action. Patient with medical history significant for  ESRD on HD, heart failure with reduced EF, HTN, DM2. Pt states recent wt gain due to fluids. Continues to work with dietitian at dialysis clinic; endorses limiting foods such as potatoes, tomatoes likely due to potassium content and following 32 oz fluid restriction. Pt endorses understanding regarding impact of sodium on fluid retention. Reports trying to limit sodium intake, though diet includes foods prepared outside home. Pt continues to express lack of understanding regarding dietary sources of sodium. RD reviewed sources of sodium and encouraged pt to limit foods consumed outside the home. Provided suggestions for easy-to-prepare, low sodium meals.          Nutrition Goals Discharge (Final Nutrition Goals Re-Evaluation):  Nutrition Goals Re-Evaluation - 03/23/24 1523       Goals   Current Weight 222 lb (100.7 kg)    Nutrition Goal Patient to limit sodium intake to <2300 mg per day.    Comment 7 lb wt gain over past month    Expected Outcome Goals in action. Patient with medical history significant for ESRD on HD, heart failure with reduced EF, HTN, DM2. Pt states recent wt gain due to fluids. Continues to work with dietitian at dialysis clinic; endorses limiting foods such as potatoes, tomatoes likely due to potassium content and following 32 oz fluid restriction. Pt endorses understanding regarding impact of sodium on fluid retention. Reports trying to limit sodium intake, though diet includes foods prepared outside home. Pt continues to express lack of understanding regarding dietary sources of sodium. RD reviewed sources of sodium and encouraged pt to limit foods consumed outside the home. Provided suggestions for easy-to-prepare, low sodium meals.          Psychosocial: Target Goals: Acknowledge presence or absence of significant depression and/or stress, maximize coping skills, provide positive support system. Participant is able to verbalize types and ability to use techniques and  skills needed for reducing stress and depression.  Initial Review & Psychosocial Screening:  Initial Psych Review & Screening - 01/26/24 1203       Initial Review   Current issues with None Identified   Pt scored 6/15 on his PHQ 2/9 screening. Pt declined any additional needs, resources, or referrals.     Family Dynamics   Good Support System? Yes  Comments Great support from wife and family      Barriers   Psychosocial barriers to participate in program There are no identifiable barriers or psychosocial needs.      Screening Interventions   Interventions Encouraged to exercise;Provide feedback about the scores to participant    Expected Outcomes Short Term goal: Utilizing psychosocial counselor, staff and physician to assist with identification of specific Stressors or current issues interfering with healing process. Setting desired goal for each stressor or current issue identified.;Long Term Goal: Stressors or current issues are controlled or eliminated.;Short Term goal: Identification and review with participant of any Quality of Life or Depression concerns found by scoring the questionnaire.;Long Term goal: The participant improves quality of Life and PHQ9 Scores as seen by post scores and/or verbalization of changes          Quality of Life Scores:  Scores of 19 and below usually indicate a poorer quality of life in these areas.  A difference of  2-3 points is a clinically meaningful difference.  A difference of 2-3 points in the total score of the Quality of Life Index has been associated with significant improvement in overall quality of life, self-image, physical symptoms, and general health in studies assessing change in quality of life.  PHQ-9: Review Flowsheet       01/26/2024  Depression screen PHQ 2/9  Decreased Interest 3  Down, Depressed, Hopeless 3  PHQ - 2 Score 6  Altered sleeping 3  Tired, decreased energy 3  Change in appetite 0  Feeling bad or failure  about yourself  2  Trouble concentrating 0  Moving slowly or fidgety/restless 0  Suicidal thoughts 1  PHQ-9 Score 15   Difficult doing work/chores Somewhat difficult    Details       Data saved with a previous flowsheet row definition        Interpretation of Total Score  Total Score Depression Severity:  1-4 = Minimal depression, 5-9 = Mild depression, 10-14 = Moderate depression, 15-19 = Moderately severe depression, 20-27 = Severe depression   Psychosocial Evaluation and Intervention:  Psychosocial Evaluation - 01/26/24 1208       Psychosocial Evaluation & Interventions   Interventions Encouraged to exercise with the program and follow exercise prescription    Comments Pt denies any psy/soc barriers or concerns. Pt scored 6/15 on his PHQ 2/9 screening. Pt declined any additional needs, resources, or referrals. Per chart review, pt has hx of schizophrenia, currently not on meds    Expected Outcomes For Thelmer to participate in PR free of any psy/soc barriers or concerns    Continue Psychosocial Services  Follow up required by staff          Psychosocial Re-Evaluation:  Psychosocial Re-Evaluation     Row Name 02/09/24 1151 03/10/24 1338 04/07/24 0850         Psychosocial Re-Evaluation   Current issues with None Identified  Pt denies any psy/soc needs or concerns None Identified  Pt denies any psy/soc needs or concerns None Identified  Pt denies any psy/soc needs or concerns     Comments Monthly psychosocial re-evaluation as follows: Pt denies any new psy/soc barriers or concerns. Pt declined any additional needs, resources, or referrals. Per chart review, pt has hx of schizophrenia, currently not on meds Monthly psychosocial re-evaluation as follows: Pt denies any new psy/soc barriers or concerns. Jamale is motiviated to increase his strength and stamnia to get a kidney transplant. He denies any psy/soc needs and  states she has great support from his wife. He declined any  additional needs, resources, or referrals. Per chart review, pt has hx of schizophrenia, currently not on meds, pt states he is stable. Monthly psychosocial re-evaluation: Pt continues to deny any new psy/soc barriers or concerns. Sevan is motivated to increase his strength and stamina to get a kidney transplant. He denies any psy/soc needs and states she has great support from his wife. He declined any additional needs, resources, or referrals. Per chart review, pt has hx of schizophrenia, currently not on meds, pt states he is stable.     Expected Outcomes For Christon to participate in PR free of any psy/soc barriers or concerns For Librado to participate in PR free of any psy/soc barriers or concerns For Chadwick to participate in PR free of any psy/soc barriers or concerns     Interventions Encouraged to attend Pulmonary Rehabilitation for the exercise Encouraged to attend Pulmonary Rehabilitation for the exercise Encouraged to attend Pulmonary Rehabilitation for the exercise     Continue Psychosocial Services  Follow up required by staff Follow up required by staff Follow up required by staff        Psychosocial Discharge (Final Psychosocial Re-Evaluation):  Psychosocial Re-Evaluation - 04/07/24 0850       Psychosocial Re-Evaluation   Current issues with None Identified   Pt denies any psy/soc needs or concerns   Comments Monthly psychosocial re-evaluation: Pt continues to deny any new psy/soc barriers or concerns. Atul is motivated to increase his strength and stamina to get a kidney transplant. He denies any psy/soc needs and states she has great support from his wife. He declined any additional needs, resources, or referrals. Per chart review, pt has hx of schizophrenia, currently not on meds, pt states he is stable.    Expected Outcomes For Jaquise to participate in PR free of any psy/soc barriers or concerns    Interventions Encouraged to attend Pulmonary Rehabilitation for the exercise     Continue Psychosocial Services  Follow up required by staff          Education: Education Goals: Education classes will be provided on a weekly basis, covering required topics. Participant will state understanding/return demonstration of topics presented.  Learning Barriers/Preferences:  Learning Barriers/Preferences - 01/26/24 1212       Learning Barriers/Preferences   Learning Barriers None    Learning Preferences None          Education Topics: Know Your Numbers Group instruction that is supported by a PowerPoint presentation. Instructor discusses importance of knowing and understanding resting, exercise, and post-exercise oxygen saturation, heart rate, and blood pressure. Oxygen saturation, heart rate, blood pressure, rating of perceived exertion, and dyspnea are reviewed along with a normal range for these values.    Exercise for the Pulmonary Patient Group instruction that is supported by a PowerPoint presentation. Instructor discusses benefits of exercise, core components of exercise, frequency, duration, and intensity of an exercise routine, importance of utilizing pulse oximetry during exercise, safety while exercising, and options of places to exercise outside of rehab.  Flowsheet Row PULMONARY REHAB OTHER RESPIRATORY from 03/04/2024 in [facility] for Heart, Vascular, & Lung Health  Date 03/04/24  Educator EP  Instruction Review Code 1- Verbalizes Understanding    MET Level  Group instruction provided by PowerPoint, verbal discussion, and written material to support subject matter. Instructor reviews what METs are and how to increase METs.    Pulmonary Medications Verbally interactive group education  provided by instructor with focus on inhaled medications and proper administration. Flowsheet Row PULMONARY REHAB OTHER RESPIRATORY from 02/26/2024 in [facility] for Heart, Vascular, & Lung Health  Date 02/26/24   Educator RT  Instruction Review Code 1- Verbalizes Understanding    Anatomy and Physiology of the Respiratory System Group instruction provided by PowerPoint, verbal discussion, and written material to support subject matter. Instructor reviews respiratory cycle and anatomical components of the respiratory system and their functions. Instructor also reviews differences in obstructive and restrictive respiratory diseases with examples of each.  Flowsheet Row PULMONARY REHAB OTHER RESPIRATORY from 02/12/2024 in [facility] for Heart, Vascular, & Lung Health  Date 02/12/24  Educator RT  Instruction Review Code 1- Verbalizes Understanding    Oxygen Safety Group instruction provided by PowerPoint, verbal discussion, and written material to support subject matter. There is an overview of What is Oxygen and Why do we need it.  Instructor also reviews how to create a safe environment for oxygen use, the importance of using oxygen as prescribed, and the risks of noncompliance. There is a brief discussion on traveling with oxygen and resources the patient may utilize. Flowsheet Row PULMONARY REHAB OTHER RESPIRATORY from 04/01/2024 in [facility] for Heart, Vascular, & Lung Health  Date 04/01/24  Educator RN  Instruction Review Code 1- Verbalizes Understanding    Oxygen Use Group instruction provided by PowerPoint, verbal discussion, and written material to discuss how supplemental oxygen is prescribed and different types of oxygen supply systems. Resources for more information are provided.  Flowsheet Row PULMONARY REHAB OTHER RESPIRATORY from 04/08/2024 in [facility] for Heart, Vascular, & Lung Health  Date 04/08/24  Educator RT  Instruction Review Code 1- Verbalizes Understanding    Breathing Techniques Group instruction that is supported by demonstration and informational handouts. Instructor discusses the benefits of  pursed lip and diaphragmatic breathing and detailed demonstration on how to perform both.     Risk Factor Reduction Group instruction that is supported by a PowerPoint presentation. Instructor discusses the definition of a risk factor, different risk factors for pulmonary disease, and how the heart and lungs work together.   Pulmonary Diseases Group instruction provided by PowerPoint, verbal discussion, and written material to support subject matter. Instructor gives an overview of the different type of pulmonary diseases. There is also a discussion on risk factors and symptoms as well as ways to manage the diseases. Flowsheet Row PULMONARY REHAB OTHER RESPIRATORY from 02/05/2024 in [facility] for Heart, Vascular, & Lung Health  Date 02/05/24  Educator RT  Instruction Review Code 1- Verbalizes Understanding    Stress and Energy Conservation Group instruction provided by PowerPoint, verbal discussion, and written material to support subject matter. Instructor gives an overview of stress and the impact it can have on the body. Instructor also reviews ways to reduce stress. There is also a discussion on energy conservation and ways to conserve energy throughout the day.   Warning Signs and Symptoms Group instruction provided by PowerPoint, verbal discussion, and written material to support subject matter. Instructor reviews warning signs and symptoms of stroke, heart attack, cold and flu. Instructor also reviews ways to prevent the spread of infection.   Other Education Group or individual verbal, written, or video instructions that support the educational goals of the pulmonary rehab program.    Knowledge Questionnaire Score:  Knowledge Questionnaire Score - 01/26/24 1203  Knowledge Questionnaire Score   Pre Score 12/18          Core Components/Risk Factors/Patient Goals at Admission:  Personal Goals and Risk Factors at Admission - 03/10/24 1340        Core Components/Risk Factors/Patient Goals on Admission   Improve shortness of breath with ADL's Yes    Intervention Provide education, individualized exercise plan and daily activity instruction to help decrease symptoms of SOB with activities of daily living.    Expected Outcomes Short Term: Improve cardiorespiratory fitness to achieve a reduction of symptoms when performing ADLs;Long Term: Be able to perform more ADLs without symptoms or delay the onset of symptoms    Heart Failure Yes    Intervention Provide a combined exercise and nutrition program that is supplemented with education, support and counseling about heart failure. Directed toward relieving symptoms such as shortness of breath, decreased exercise tolerance, and extremity edema.    Expected Outcomes Improve functional capacity of life;Short term: Attendance in program 2-3 days a week with increased exercise capacity. Reported lower sodium intake. Reported increased fruit and vegetable intake. Reports medication compliance.;Short term: Daily weights obtained and reported for increase. Utilizing diuretic protocols set by physician.;Long term: Adoption of self-care skills and reduction of barriers for early signs and symptoms recognition and intervention leading to self-care maintenance.          Core Components/Risk Factors/Patient Goals Review:   Goals and Risk Factor Review     Row Name 02/09/24 1154 03/10/24 1340 04/07/24 0854         Core Components/Risk Factors/Patient Goals Review   Personal Goals Review Improve shortness of breath with ADL's;Develop more efficient breathing techniques such as purse lipped breathing and diaphragmatic breathing and practicing self-pacing with activity.;Heart Failure Improve shortness of breath with ADL's;Develop more efficient breathing techniques such as purse lipped breathing and diaphragmatic breathing and practicing self-pacing with activity.;Heart Failure Improve shortness of  breath with ADL's;Heart Failure     Review Monthly review of patient's Core Components/Risk Factors/Patient Goals are as follows: Goal progressing for improving his shortness of breath with ADLs. Vandy is exercising on the treadmill and seated elliptical. His oxygen is stable on room air. He is working to build up his strength and stamina. Goal progressing for developing more efficient breathing techniques such as purse lipped breathing and diaphragmatic breathing; and practicing self-pacing with activity. Romon can perform purse lipped breathing while short of breath. He demonstrates this while performing the warmup and exercising. He can initiate PLB on his own. He is working on diaphragmatic breathing at home but still needs assistance. We will continue to work on these breathing exercises with him. Goal progressing for improved signs and symptoms of heart failure. Puneet has had no HF exacerbations since he has started [employer]. Serigne will continue to benefit from the PR program for exercise and education. Monthly review of patient's Core Components/Risk Factors/Patient Goals are as follows: Goal progressing for improving his shortness of breath with ADLs. Yamato is exercising on the treadmill and seated elliptical. His oxygen is stable on room air. He is working to build up his strength and stamina for a possible kidney transplant. Goal met for developing more efficient breathing techniques such as purse lipped breathing and diaphragmatic breathing; and practicing self-pacing with activity. Durwood can perform purse lipped breathing while short of breath. He demonstrates this while performing the warmup and exercising. He can initiate PLB on his own. He is working on diaphragmatic breathing at home. Goal progressing  for improved signs and symptoms of heart failure. Wesson has had no HF exacerbations since he has started [employer]. He is compliant with his medications, weighing daily and eating a low sodium diet.  Dvonte will continue to benefit from the PR program for nutrition, exercise, education and lifestyle modification. Monthly review of patient's Core Components/Risk Factors/Patient Goals are as follows: Goal progressing for improving his shortness of breath with ADLs. Chasin is exercising on the treadmill and seated elliptical. His oxygen is stable on room air. He is working to build up his strength and stamina for a possible kidney transplant. Goal progressing for improved signs and symptoms of heart failure. Reino has had no HF exacerbations since he has started [employer]. He is compliant with his medications, weighing daily and eating a low sodium diet. Joshuah will continue to benefit from the PR program for nutrition, exercise, education and lifestyle modification.     Expected Outcomes Pt will show progress toward meeting expected goals and outcomes. Pt will show progress toward meeting expected goals and outcomes. Pt will show progress toward meeting expected goals and outcomes.        Core Components/Risk Factors/Patient Goals at Discharge (Final Review):   Goals and Risk Factor Review - 04/07/24 0854       Core Components/Risk Factors/Patient Goals Review   Personal Goals Review Improve shortness of breath with ADL's;Heart Failure    Review Monthly review of patient's Core Components/Risk Factors/Patient Goals are as follows: Goal progressing for improving his shortness of breath with ADLs. Jayvier is exercising on the treadmill and seated elliptical. His oxygen is stable on room air. He is working to build up his strength and stamina for a possible kidney transplant. Goal progressing for improved signs and symptoms of heart failure. Sharon has had no HF exacerbations since he has started [employer]. He is compliant with his medications, weighing daily and eating a low sodium diet. Ianmichael will continue to benefit from the PR program for nutrition, exercise, education and lifestyle modification.     Expected Outcomes Pt will show progress toward meeting expected goals and outcomes.          ITP Comments:   Comments: Pt is making expected progress toward Pulmonary Rehab goals after completing 15 session(s). Recommend continued exercise, life style modification, education, and utilization of breathing techniques to increase stamina and strength, while also decreasing shortness of breath with exertion.  Dr. Slater Staff is Medical Director for Pulmonary Rehab at [facility].       [1]  Current Outpatient Medications:    acetaminophen  (TYLENOL ) 500 MG tablet, Take 1,000 mg by mouth every 6 (six) hours as needed., Disp: , Rfl:    dorzolamide -timolol  (COSOPT ) 2-0.5 % ophthalmic solution, INSTILL 1 DROP INTO BOTH EYES TWICE A DAY, Disp: 30 mL, Rfl: 2   esomeprazole (NEXIUM) 40 MG capsule, Take 40 mg by mouth daily., Disp: , Rfl:    gabapentin  (NEURONTIN ) 300 MG capsule, Take 300 mg by mouth at bedtime., Disp: , Rfl:    loperamide  (IMODIUM ) 2 MG capsule, Take 1 capsule (2 mg total) by mouth 4 (four) times daily as needed for diarrhea or loose stools., Disp: 12 capsule, Rfl: 0   methocarbamol  (ROBAXIN ) 500 MG tablet, Take 1 tablet (500 mg total) by mouth every 8 (eight) hours as needed for muscle spasms., Disp: 20 tablet, Rfl: 0   metoprolol  succinate (TOPROL -XL) 100 MG 24 hr tablet, Take 1 tablet (100 mg total) by mouth daily., Disp:  90 tablet, Rfl: 3   sacubitril -valsartan  (ENTRESTO ) 24-26 MG, Take 1 tablet by mouth 2 (two) times daily., Disp: 60 tablet, Rfl: 11   sertraline  (ZOLOFT ) 25 MG tablet, Take 25 mg by mouth 3 (three) times a week. M W F, Disp: , Rfl:    sucroferric oxyhydroxide (VELPHORO ) 500 MG chewable tablet, Chew 500 mg by mouth 3 (three) times daily with meals., Disp: , Rfl:    traMADol  (ULTRAM ) 50 MG tablet, Take 25 mg by mouth as needed. 3 x weekly after dialysis as needed for pain, Disp: , Rfl:  [2]  Social History Tobacco Use  Smoking Status Former  Smokeless  Tobacco Never

## 2024-04-15 ENCOUNTER — Encounter (HOSPITAL_COMMUNITY)
Admission: RE | Admit: 2024-04-15 | Discharge: 2024-04-15 | Disposition: A | Discharge status: Home / Self Care | Source: Ambulatory Visit | Attending: Cardiology | Admitting: Cardiology

## 2024-04-15 VITALS — Wt 222.9 lb

## 2024-04-15 DIAGNOSIS — I5020 Unspecified systolic (congestive) heart failure: Secondary | ICD-10-CM

## 2024-04-15 NOTE — Progress Notes (Signed)
 Daily Session Note  Patient Details  Name: Jonathan Mcneil MRN: [id number] Date of Birth: [birth year] Referring Provider:   [name] Pulmonary Rehab Walk Test from 01/26/2024 in [facility] for Heart, Vascular, & Lung Health  Referring Provider [name]    Encounter Date: 04/15/2024  Check In:  Session Check In - 04/15/24 1334       Check-In   Supervising physician immediately available to respond to emergencies [facility] MD immediately available    Physician(s) Barnie Press, NP    Location [facility]    Staff Present Ronal Levin, RN, BSN;Randi Midge HECKLE, ACSM-CEP, Exercise Physiologist;Albie Bazin Claudene Neita Moats, MS, ACSM-CEP, Exercise Physiologist    Virtual Visit No    Medication changes reported     No    Fall or balance concerns reported    No    Tobacco Cessation No Change    Warm-up and Cool-down Performed as group-led instruction    Resistance Training Performed Yes    VAD Patient? No    PAD/SET Patient? No      Pain Assessment   Currently in Pain? No/denies    Pain Score 0-No pain    Multiple Pain Sites No          Capillary Blood Glucose: No results found for this or any previous visit (from the past 24 hours).    Tobacco Use History[1]  Goals Met:  Proper associated with RPD/PD & O2 Sat Independence with exercise equipment Exercise tolerated well No report of concerns or symptoms today Strength training completed today  Goals Unmet:  Not Applicable  Comments: Service time is from 1310 to 1458.    Dr. Slater Staff is Medical Director for Pulmonary Rehab at [facility].     [1]  Social History Tobacco Use  Smoking Status Former  Smokeless Tobacco Never

## 2024-04-20 ENCOUNTER — Encounter (HOSPITAL_COMMUNITY): Admission: RE | Admit: 2024-04-20 | Source: Ambulatory Visit

## 2024-04-22 ENCOUNTER — Encounter (HOSPITAL_COMMUNITY)

## 2024-04-22 ENCOUNTER — Telehealth (HOSPITAL_COMMUNITY): Payer: Self-pay

## 2024-04-22 NOTE — Telephone Encounter (Signed)
 Called patient to let him know that elevator is out of Order.  Advised to use the top level of parking deck that will put them on 2nd level and use stairway to 3rd floor.

## 2024-04-27 ENCOUNTER — Encounter (HOSPITAL_COMMUNITY)

## 2024-04-28 ENCOUNTER — Telehealth (HOSPITAL_COMMUNITY): Payer: Self-pay

## 2024-04-28 NOTE — Telephone Encounter (Signed)
 Called pt to check on him since he has missed PR classes. His wife states he is doing well and has not been able to get out due to the weather.

## 2024-04-29 ENCOUNTER — Encounter (HOSPITAL_COMMUNITY)

## 2024-04-30 ENCOUNTER — Ambulatory Visit: Admitting: Podiatry

## 2024-05-04 ENCOUNTER — Encounter (HOSPITAL_COMMUNITY)

## 2024-05-06 ENCOUNTER — Encounter (HOSPITAL_COMMUNITY)

## 2024-05-11 ENCOUNTER — Encounter (HOSPITAL_COMMUNITY)

## 2024-05-13 ENCOUNTER — Encounter (HOSPITAL_COMMUNITY)

## 2024-05-18 ENCOUNTER — Encounter (HOSPITAL_COMMUNITY)

## 2024-05-20 ENCOUNTER — Encounter (HOSPITAL_COMMUNITY)

## 2024-05-28 ENCOUNTER — Ambulatory Visit: Admitting: Podiatry

## 2024-06-23 ENCOUNTER — Encounter (INDEPENDENT_AMBULATORY_CARE_PROVIDER_SITE_OTHER): Admitting: Ophthalmology
